# Patient Record
Sex: Male | Born: 1950
Health system: Southern US, Community
[De-identification: ages and names within clinical notes are randomized; demographics above are authoritative.]

## PROBLEM LIST (undated history)

## (undated) DIAGNOSIS — E785 Hyperlipidemia, unspecified: Secondary | ICD-10-CM

## (undated) DIAGNOSIS — C801 Malignant (primary) neoplasm, unspecified: Secondary | ICD-10-CM

## (undated) DIAGNOSIS — G629 Polyneuropathy, unspecified: Secondary | ICD-10-CM

## (undated) DIAGNOSIS — N400 Enlarged prostate without lower urinary tract symptoms: Secondary | ICD-10-CM

## (undated) DIAGNOSIS — I480 Paroxysmal atrial fibrillation: Secondary | ICD-10-CM

## (undated) DIAGNOSIS — K7460 Unspecified cirrhosis of liver: Secondary | ICD-10-CM

## (undated) DIAGNOSIS — N50811 Right testicular pain: Secondary | ICD-10-CM

## (undated) DIAGNOSIS — K219 Gastro-esophageal reflux disease without esophagitis: Secondary | ICD-10-CM

## (undated) DIAGNOSIS — N411 Chronic prostatitis: Secondary | ICD-10-CM

## (undated) DIAGNOSIS — M109 Gout, unspecified: Secondary | ICD-10-CM

## (undated) DIAGNOSIS — E663 Overweight: Secondary | ICD-10-CM

## (undated) DIAGNOSIS — I10 Essential (primary) hypertension: Secondary | ICD-10-CM

## (undated) DIAGNOSIS — I5022 Chronic systolic (congestive) heart failure: Secondary | ICD-10-CM

## (undated) DIAGNOSIS — E119 Type 2 diabetes mellitus without complications: Secondary | ICD-10-CM

## (undated) DIAGNOSIS — R972 Elevated prostate specific antigen [PSA]: Secondary | ICD-10-CM

## (undated) DIAGNOSIS — R350 Frequency of micturition: Secondary | ICD-10-CM

## (undated) DIAGNOSIS — L409 Psoriasis, unspecified: Secondary | ICD-10-CM

## (undated) DIAGNOSIS — M1990 Unspecified osteoarthritis, unspecified site: Secondary | ICD-10-CM

## (undated) DIAGNOSIS — I6930 Unspecified sequelae of cerebral infarction: Secondary | ICD-10-CM

## (undated) HISTORY — DX: Hyperlipidemia, unspecified: E78.5

## (undated) HISTORY — DX: Gastro-esophageal reflux disease without esophagitis: K21.9

## (undated) HISTORY — DX: Gout, unspecified: M10.9

## (undated) HISTORY — DX: Malignant (primary) neoplasm, unspecified: C80.1

## (undated) HISTORY — DX: Unspecified osteoarthritis, unspecified site: M19.90

## (undated) HISTORY — DX: Type 2 diabetes mellitus without complications: E11.9

## (undated) HISTORY — DX: Frequency of micturition: R35.0

## (undated) HISTORY — DX: Overweight: E66.3

## (undated) HISTORY — DX: Essential (primary) hypertension: I10

## (undated) HISTORY — DX: Right testicular pain: N50.811

## (undated) HISTORY — DX: Chronic prostatitis: N41.1

## (undated) HISTORY — DX: Benign prostatic hyperplasia without lower urinary tract symptoms: N40.0

## (undated) HISTORY — DX: Elevated prostate specific antigen (PSA): R97.20

## (undated) HISTORY — PX: OTHER SURGICAL HISTORY: SHX169

## (undated) HISTORY — PX: PILONIDAL CYST EXCISION: SHX744

## (undated) HISTORY — DX: Psoriasis, unspecified: L40.9

---

## 2010-09-11 ENCOUNTER — Ambulatory Visit: Payer: Self-pay | Admitting: Gastroenterology

## 2014-01-03 DIAGNOSIS — IMO0001 Reserved for inherently not codable concepts without codable children: Secondary | ICD-10-CM | POA: Insufficient documentation

## 2014-01-03 DIAGNOSIS — K219 Gastro-esophageal reflux disease without esophagitis: Secondary | ICD-10-CM

## 2014-01-03 DIAGNOSIS — M109 Gout, unspecified: Secondary | ICD-10-CM | POA: Insufficient documentation

## 2014-01-03 DIAGNOSIS — L409 Psoriasis, unspecified: Secondary | ICD-10-CM | POA: Insufficient documentation

## 2014-01-03 DIAGNOSIS — I10 Essential (primary) hypertension: Secondary | ICD-10-CM | POA: Insufficient documentation

## 2014-01-03 DIAGNOSIS — E785 Hyperlipidemia, unspecified: Secondary | ICD-10-CM | POA: Insufficient documentation

## 2014-01-03 DIAGNOSIS — M1990 Unspecified osteoarthritis, unspecified site: Secondary | ICD-10-CM | POA: Insufficient documentation

## 2014-01-03 DIAGNOSIS — E1169 Type 2 diabetes mellitus with other specified complication: Secondary | ICD-10-CM | POA: Insufficient documentation

## 2014-05-18 DIAGNOSIS — E119 Type 2 diabetes mellitus without complications: Secondary | ICD-10-CM | POA: Insufficient documentation

## 2014-06-02 ENCOUNTER — Ambulatory Visit: Payer: Self-pay | Admitting: Physician Assistant

## 2014-06-30 ENCOUNTER — Ambulatory Visit: Payer: Self-pay | Admitting: Physician Assistant

## 2015-05-05 ENCOUNTER — Other Ambulatory Visit: Payer: Self-pay | Admitting: Urology

## 2015-05-05 DIAGNOSIS — R972 Elevated prostate specific antigen [PSA]: Secondary | ICD-10-CM

## 2015-05-15 NOTE — Addendum Note (Signed)
Addended by: Toniann Fail C on: 05/15/2015 11:13 AM   Modules accepted: Orders

## 2015-05-15 NOTE — Telephone Encounter (Signed)
Spoke with pt in reference to finasteride. Pt stated he has quit taking medication due to not knowing if he should continue. Nurse read pt Peter Ryan's last note, which stated he should continue medication. Pt voiced understanding stating he would need more medication. Nurse made pt aware he would need a f/u appt for further refills. Pt voiced understanding and was transferred to the front to make appt. Labs orders have been placed.

## 2015-05-16 ENCOUNTER — Other Ambulatory Visit: Payer: BLUE CROSS/BLUE SHIELD

## 2015-05-16 DIAGNOSIS — R972 Elevated prostate specific antigen [PSA]: Secondary | ICD-10-CM

## 2015-05-17 LAB — PSA: PROSTATE SPECIFIC AG, SERUM: 3.8 ng/mL (ref 0.0–4.0)

## 2015-05-23 ENCOUNTER — Encounter: Payer: Self-pay | Admitting: *Deleted

## 2015-05-23 ENCOUNTER — Other Ambulatory Visit: Payer: Self-pay | Admitting: *Deleted

## 2015-05-30 ENCOUNTER — Encounter: Payer: Self-pay | Admitting: Urology

## 2015-05-30 ENCOUNTER — Ambulatory Visit (INDEPENDENT_AMBULATORY_CARE_PROVIDER_SITE_OTHER): Payer: BLUE CROSS/BLUE SHIELD | Admitting: Urology

## 2015-05-30 VITALS — BP 187/79 | HR 103 | Ht 69.0 in | Wt 297.8 lb

## 2015-05-30 DIAGNOSIS — N401 Enlarged prostate with lower urinary tract symptoms: Secondary | ICD-10-CM

## 2015-05-30 DIAGNOSIS — R972 Elevated prostate specific antigen [PSA]: Secondary | ICD-10-CM

## 2015-05-30 DIAGNOSIS — N138 Other obstructive and reflux uropathy: Secondary | ICD-10-CM

## 2015-05-30 MED ORDER — FINASTERIDE 5 MG PO TABS: 5.0000 mg | ORAL_TABLET | Freq: Every day | ORAL | Status: DC

## 2015-05-30 NOTE — Progress Notes (Signed)
05/30/2015 4:02 PM   Peter Ryan 12/28/1950 [id number]  Referring provider: No referring provider defined for this encounter.  Chief Complaint  Patient presents with  . Elevated PSA    HPI: Patient is a 64 year old white male with a history of elevated PSA and BPH with LUTS who presents today for a 6 month follow-up.  Elevated PSA Patient was found to have an elevated PSA of 7.5 ng/mL in 05/2014.  He did have an infection at that time.  His PSA's have been decreasing.  His most recent PSA is 3.8 ng/mL on 05/16/2015.  BPH with LUTS His IPSS score today is 3, which is mild lower urinary tract symptomatology. He is pleased with his quality life due to his urinary symptoms.  His major complaint today nocturia and frequency.  He has had these symptoms for off and on for many years.  He denies any dysuria, hematuria or suprapubic pain.  He currently taking finasteride 5 mg daily.  His has had prostatitis in the past.  He also denies any recent fevers, chills, nausea or vomiting.  He does not have a family history of PCa.      IPSS      05/30/15 1000       International Prostate Symptom Score   How often have you had the sensation of not emptying your bladder? Not at All     How often have you had to urinate less than every two hours? Less than half the time     How often have you found you stopped and started again several times when you urinated? Not at All     How often have you found it difficult to postpone urination? Not at All     How often have you had a weak urinary stream? Not at All     How often have you had to strain to start urination? Not at All     How many times did you typically get up at night to urinate? 1 Time     Total IPSS Score 3     Quality of Life due to urinary symptoms   If you were to spend the rest of your life with your urinary condition just the way it is now how would you feel about that? Pleased        Score:  1-7 Mild 8-19 Moderate 20-35  Severe     PMH: Past Medical History  Diagnosis Date  . Dyslipidemia   . Gastric reflux   . Psoriasis   . Arthritis   . Gout   . HTN (hypertension)   . Elevated PSA   . Urinary frequency   . Testicular pain, right   . Chronic prostatitis   . Over weight   . BPH (benign prostatic hyperplasia)     Surgical History: Past Surgical History  Procedure Laterality Date  . Hydrocelectomy    . Pilonidal cyst excision      Home Medications:    Medication List       This list is accurate as of: 05/30/15  4:02 PM.  Always use your most recent med list.               allopurinol 300 MG tablet  Commonly known as:  ZYLOPRIM  TAKE 1 TABLET BY MOUTH EVERY DAY     amLODipine 10 MG tablet  Commonly known as:  NORVASC  Take by mouth.     aspirin EC 81 MG tablet  Take  by mouth.     finasteride 5 MG tablet  Commonly known as:  PROSCAR  Take 1 tablet (5 mg total) by mouth daily.     losartan 100 MG tablet  Commonly known as:  COZAAR  Take by mouth.     omeprazole 20 MG capsule  Commonly known as:  PRILOSEC  Take by mouth.     trandolapril 4 MG tablet  Commonly known as:  MAVIK  TAKE 1 TABLET (4 MG TOTAL) BY MOUTH 2 (TWO) TIMES DAILY.     triamcinolone cream 0.1 %  Commonly known as:  KENALOG  APPLY TO AFFECTED AREA ON LEGS EVERY DAY - TWICE A DAY UNTIL CLEAR     Vitamin D3 1000 UNITS Caps  Take by mouth.        Allergies:  Allergies  Allergen Reactions  . Metformin Other (See Comments)    Stomach upset     Family History: Family History  Problem Relation Age of Onset  . Bladder Cancer Neg Hx   . Uterine cancer Mother   . Kidney disease Neg Hx     Social History:  reports that he has quit smoking. He does not have any smokeless tobacco history on file. He reports that he does not drink alcohol or use illicit drugs.  ROS: UROLOGY Frequent Urination?: No Hard to postpone urination?: No Burning/pain with urination?: No Get up at night to  urinate?: No Leakage of urine?: No Urine stream starts and stops?: No Trouble starting stream?: No Do you have to strain to urinate?: No Blood in urine?: No Urinary tract infection?: No Sexually transmitted disease?: No Injury to kidneys or bladder?: No Painful intercourse?: No Weak stream?: No Erection problems?: No Penile pain?: No  Gastrointestinal Nausea?: No Vomiting?: No Indigestion/heartburn?: No Diarrhea?: No Constipation?: No  Constitutional Fever: No Night sweats?: No Weight loss?: No Fatigue?: No  Skin Skin rash/lesions?: No Itching?: No  Eyes Blurred vision?: No Double vision?: No  Ears/Nose/Throat Sore throat?: No Sinus problems?: No  Hematologic/Lymphatic Swollen glands?: No Easy bruising?: No  Cardiovascular Leg swelling?: No Chest pain?: No  Respiratory Cough?: No Shortness of breath?: No  Endocrine Excessive thirst?: No  Musculoskeletal Back pain?: No Joint pain?: No  Neurological Headaches?: No Dizziness?: No  Psychologic Depression?: No Anxiety?: No  Physical Exam: BP 187/79 mmHg  Pulse 103  GU: Patient with uncircumcised phallus. Foreskin easily retracted  Urethral meatus is patent.  No penile discharge. No penile lesions or rashes. Scrotum without lesions, cysts, rashes and/or edema.  Testicles are located scrotally bilaterally. No masses are appreciated in the testicles. Left and right epididymis are normal. Rectal: Patient with  normal sphincter tone. Perineum without scarring or rashes. No rectal masses are appreciated. Prostate is approximately 50 grams, no nodules are appreciated. Seminal vesicles are normal.   Laboratory Data:  Lab Results  Component Value Date   PSA 3.8 05/16/2015   PSA history:  7.5 ng/mL on 06/21/2014             6.4 ng/mL on 08/05/2014             4.1 ng/mL on 09/28/2014  Assessment & Plan:    1. Elevated PSA:   Patient was initially found to have an elevated PSA of 7.5 in September  2015. He did also have a urinary tract infection at this time. He is in place on finasteride due to his history of chronic prostatitis. His PSA continues to decrease. It is currently 3.8 ng/mL  on 05/16/2015.  2. BPH with LUTS:   Patient's IPSS score is 3/1.   His DRE demonstrates enlargement, no nodules.  Patient would like to continue  medical treatment.  I sent a refill for finasteride 5 mg daily in to his pharmacy.   He will follow up in 6 months for a PSA, DRE and an IPSS.  Return in about 1 year (around 05/29/2016) for IPSS.  Zara Council, [facility] [address] [place], [place] [address] [phone]

## 2015-06-09 ENCOUNTER — Other Ambulatory Visit: Payer: Self-pay | Admitting: Family Medicine

## 2015-06-12 ENCOUNTER — Telehealth: Payer: Self-pay | Admitting: Family Medicine

## 2015-06-12 NOTE — Telephone Encounter (Signed)
See below please-aa 

## 2015-06-12 NOTE — Telephone Encounter (Signed)
FYI....  Pt called to tell you that [facility] should be sending you a request for all his prescriptions some Dr. Lovie Macadamia ordered.  He just wanted to let you know that he knows and approves this...  Any questions his call back is [phone]  Thanks teri

## 2015-06-13 ENCOUNTER — Other Ambulatory Visit: Payer: Self-pay

## 2015-06-13 MED ORDER — TRANDOLAPRIL 4 MG PO TABS: 4.0000 mg | ORAL_TABLET | Freq: Every day | ORAL | Status: DC

## 2015-06-13 MED ORDER — ALLOPURINOL 300 MG PO TABS: 300.0000 mg | ORAL_TABLET | Freq: Every day | ORAL | Status: DC

## 2015-06-13 MED ORDER — AMLODIPINE BESYLATE 10 MG PO TABS: 10.0000 mg | ORAL_TABLET | Freq: Every day | ORAL | Status: DC

## 2015-06-27 MED ORDER — TRANDOLAPRIL 4 MG PO TABS: 4.0000 mg | ORAL_TABLET | Freq: Two times a day (BID) | ORAL | Status: DC

## 2015-06-27 NOTE — Telephone Encounter (Signed)
Pt called back regarding one of his prescriptions trandolapril 4mg  should be two pills once a day.  Instead of 41 a day.  quanity should be 60 a month.  Pt Call back number [phone]  Thanks Con Memos

## 2015-06-27 NOTE — Telephone Encounter (Signed)
LOV 11/21/14 and no future appts have been made so far.-aa

## 2015-06-27 NOTE — Telephone Encounter (Signed)
He can have 1 month refills. He will need appointment sometime in October. Thank you.

## 2015-06-27 NOTE — Telephone Encounter (Signed)
When was this patient's last visit and what is his next visit scheduled.?

## 2015-06-27 NOTE — Telephone Encounter (Signed)
RF given and note sent to pharmacist about appt needed-aa

## 2015-08-14 ENCOUNTER — Other Ambulatory Visit: Payer: Self-pay | Admitting: Family Medicine

## 2015-08-15 ENCOUNTER — Ambulatory Visit (INDEPENDENT_AMBULATORY_CARE_PROVIDER_SITE_OTHER): Payer: BLUE CROSS/BLUE SHIELD | Admitting: Family Medicine

## 2015-08-15 ENCOUNTER — Encounter: Payer: Self-pay | Admitting: Family Medicine

## 2015-08-15 VITALS — BP 164/86 | HR 80 | Temp 98.4°F | Resp 16 | Wt 303.0 lb

## 2015-08-15 DIAGNOSIS — I10 Essential (primary) hypertension: Secondary | ICD-10-CM | POA: Diagnosis not present

## 2015-08-15 DIAGNOSIS — E118 Type 2 diabetes mellitus with unspecified complications: Secondary | ICD-10-CM | POA: Diagnosis not present

## 2015-08-15 DIAGNOSIS — Z794 Long term (current) use of insulin: Secondary | ICD-10-CM | POA: Diagnosis not present

## 2015-08-15 LAB — POCT UA - MICROALBUMIN: Microalbumin Ur, POC: 100 mg/L

## 2015-08-15 LAB — POCT GLYCOSYLATED HEMOGLOBIN (HGB A1C): Hemoglobin A1C: 6.8

## 2015-08-15 MED ORDER — AMLODIPINE BESYLATE 10 MG PO TABS: 10.0000 mg | ORAL_TABLET | Freq: Every day | ORAL | Status: DC

## 2015-08-15 MED ORDER — TRANDOLAPRIL 4 MG PO TABS: 4.0000 mg | ORAL_TABLET | Freq: Two times a day (BID) | ORAL | Status: DC

## 2015-08-15 NOTE — Progress Notes (Signed)
Patient ID: Peter Ryan, male   DOB: 01-22-1951, 64 y.o.   MRN: [id number]       Patient: Peter Ryan Male    DOB: [birth year]   64 y.o.   MRN: [id number] Visit Date: 08/15/2015  Today's Provider: Wilhemena Durie, MD   Chief Complaint  Patient presents with  . Hypertension   Subjective:    HPI  Hypertension, follow-up:  BP Readings from Last 3 Encounters:  08/15/15 164/86  05/30/15 187/79    He was last seen for hypertension 3 months ago.  Management since that visit includes adding trandolapril. He reports good compliance with treatment. He is not having side effects.  He is not exercising. He is not adherent to low salt diet.   Outside blood pressures are 130/80. He is experiencing none.  Patient denies chest pain, chest pressure/discomfort, irregular heart beat, lower extremity edema, palpitations and tachypnea.   Cardiovascular risk factors include diabetes mellitus.      Weight trend: fluctuating a bit Wt Readings from Last 3 Encounters:  08/15/15 303 lb (137.44 kg)  05/30/15 297 lb 12.8 oz (135.081 kg)    Current diet: in general, an "unhealthy" diet  ------------------------------------------------------------------------      Allergies  Allergen Reactions  . Metformin Other (See Comments)    Stomach upset    Previous Medications   ALLOPURINOL (ZYLOPRIM) 300 MG TABLET    Take 1 tablet (300 mg total) by mouth daily.   AMLODIPINE (NORVASC) 10 MG TABLET    Take 1 tablet (10 mg total) by mouth daily.   ASPIRIN EC 81 MG TABLET    Take by mouth.   CHOLECALCIFEROL (VITAMIN D3) 1000 UNITS CAPS    Take by mouth.   FINASTERIDE (PROSCAR) 5 MG TABLET    Take 1 tablet (5 mg total) by mouth daily.   LOSARTAN (COZAAR) 100 MG TABLET    Take by mouth.   OMEPRAZOLE (PRILOSEC) 20 MG CAPSULE    Take by mouth.   TRANDOLAPRIL (MAVIK) 4 MG TABLET    Take 1 tablet (4 mg total) by mouth 2 times daily at 12 noon and 4 pm.   TRIAMCINOLONE CREAM (KENALOG) 0.1 %     APPLY TO AFFECTED AREA ON LEGS EVERY DAY - TWICE A DAY UNTIL CLEAR    Review of Systems  Constitutional: Negative.   Respiratory: Negative.   Cardiovascular: Negative.   Endocrine: Negative.   Musculoskeletal: Negative.   Allergic/Immunologic: Negative.   Neurological: Negative.   Hematological: Negative.   Psychiatric/Behavioral: Negative.     Social History  Substance Use Topics  . Smoking status: Former Research scientist (life sciences)  . Smokeless tobacco: Not on file     Comment: quit 40 years ago  . Alcohol Use: No   Objective:   BP 164/86 mmHg  Pulse 80  Temp(Src) 98.4 F (36.9 C)  Resp 16  Wt 303 lb (137.44 kg)  Physical Exam  Constitutional: He is oriented to person, place, and time. He appears well-developed and well-nourished.  HENT:  Head: Normocephalic and atraumatic.  Right Ear: External ear normal.  Left Ear: External ear normal.  Nose: Nose normal.  Eyes: Conjunctivae are normal.  Neck: Neck supple.  Cardiovascular: Normal rate, regular rhythm and normal heart sounds.   Pulmonary/Chest: Effort normal.  Abdominal: Soft.  Neurological: He is alert and oriented to person, place, and time.  Skin: Skin is warm and dry.  Psychiatric: He has a normal mood and affect. His behavior is normal. Judgment and thought content normal.  Assessment & Plan:     1. Type 2 diabetes mellitus with complication, with long-term current use of insulin (HCC)  - POCT UA - Microalbumin - POCT glycosylated hemoglobin (Hb A1C)--6.8 today - Ambulatory referral to diabetic education  2. Benign essential HTN  - amLODipine (NORVASC) 10 MG tablet; Take 1 tablet (10 mg total) by mouth daily.  Dispense: 30 tablet; Refill: 6 - trandolapril (MAVIK) 4 MG tablet; Take 1 tablet (4 mg total) by mouth 2 times daily at 12 noon and 4 pm.  Dispense: 60 tablet; Refill: 6 3. Morbid obesity Diet and exercise discussed again. This is been stressed to the patient in the past and he realizes that he has been  completely noncompliant 4. Noncompliance This is a worrisome issue is patient has only kept appointments when I refused to refill medications without a visit. Otherwise he would not be in the office today. I attempted to discuss the importance of follow-up with patient again today.      Peter Ryan Mon, MD  [facility]

## 2015-08-21 ENCOUNTER — Other Ambulatory Visit: Payer: Self-pay | Admitting: Family Medicine

## 2015-08-22 ENCOUNTER — Encounter: Payer: BLUE CROSS/BLUE SHIELD | Attending: Family Medicine | Admitting: *Deleted

## 2015-08-22 ENCOUNTER — Encounter: Payer: Self-pay | Admitting: *Deleted

## 2015-08-22 VITALS — BP 160/90 | Ht 70.0 in | Wt 298.5 lb

## 2015-08-22 DIAGNOSIS — E119 Type 2 diabetes mellitus without complications: Secondary | ICD-10-CM | POA: Diagnosis not present

## 2015-08-22 NOTE — Patient Instructions (Addendum)
Check blood sugars 2 x day before breakfast and 2 hrs after supper 3-4 x week Exercise: Begin walking  for  15  minutes   3 days a week and gradually increase to 150 minutes/week Avoid sugar sweetened drinks (tea) Eat 3 meals day,   2 snacks a day Space meals 4-6 hours apart Bring blood sugar records to the next class

## 2015-08-22 NOTE — Progress Notes (Signed)
Diabetes Self-Management Education  Visit Type: First/Initial  Appt. Start Time: 1110 Appt. End Time: 1225  08/22/2015  Peter Ryan, identified by name and date of birth, is a 64 y.o. male with a diagnosis of Diabetes: Type 2.   ASSESSMENT  Blood pressure 160/90, height 5\' 10"  (1.778 m), weight 298 lb 8 oz (135.399 kg). Body mass index is 42.83 kg/(m^2).      Diabetes Self-Management Education - 08/22/15 1249    Visit Information   Visit Type First/Initial   Initial Visit   Diabetes Type Type 2   Are you currently following a meal plan? No   Are you taking your medications as prescribed? Yes   Date Diagnosed "recently" - per labs from 2015 his A1C was 7.0 %   Health Coping   How would you rate your overall health? Good   Psychosocial Assessment   Patient Belief/Attitude about Diabetes Motivated to manage diabetes  "not happy with knowing it"   Self-care barriers None   Self-management support Family;Doctor's office   Other persons present Spouse/SO   Patient Concerns Nutrition/Meal planning;Monitoring;Healthy Lifestyle;Glycemic Control;Weight Control   Special Needs None   Preferred Learning Style Auditory;Visual   Learning Readiness Change in progress   How often do you need to have someone help you when you read instructions, pamphlets, or other written materials from your doctor or pharmacy? 1 - Never   What is the last grade level you completed in school? [id number]   Complications   Last HgB A1C per patient/outside source 6.8 %  08/15/15   How often do you check your blood sugar? 0 times/day (not testing)  Has a meter but is not testing. He will call MD office for prescription for new strips and lancets.    Have you had a dilated eye exam in the past 12 months? Yes  appt scheduled for today   Have you had a dental exam in the past 12 months? Yes   Are you checking your feet? No   Dietary Intake   Breakfast cereal and milk; eggs, bacon, chicken sausage, grits,  oatmeal   Snack (morning) apple or yogurt or nabs   Lunch frozen dinner   Snack (afternoon) apple or yogurt or nabs   Dinner chicken or fish, vegetables, occasional pizza   Beverage(s) water, diet soda, sugar sweetened tea   Exercise   Exercise Type ADL's   Patient Education   Previous Diabetes Education No   Disease state  Definition of diabetes, type 1 and 2, and the diagnosis of diabetes   Nutrition management  Role of diet in the treatment of diabetes and the relationship between the three main macronutrients and blood glucose level   Physical activity and exercise  Role of exercise on diabetes management, blood pressure control and cardiac health.   Monitoring Purpose and frequency of SMBG.;Identified appropriate SMBG and/or A1C goals.   Chronic complications Relationship between chronic complications and blood glucose control;Dental care;Retinopathy and reason for yearly dilated eye exams   Psychosocial adjustment Identified and addressed patients feelings and concerns about diabetes   Individualized Goals (developed by patient)   Reducing Risk Improve blood sugars Decrease medications Prevent diabetes complications Lose weight Lead a healthier lifestyle Become more fit   Outcomes   Expected Outcomes Demonstrated interest in learning. Expect positive outcomes      Individualized Plan for Diabetes Self-Management Training:   Learning Objective:  Patient will have a greater understanding of diabetes self-management. Patient education plan is to attend  individual and/or group sessions per assessed needs and concerns.   Plan:   Patient Instructions  Check blood sugars 2 x day before breakfast and 2 hrs after supper 3-4 x week Exercise: Begin walking  for  15  minutes   3 days a week and gradually increase to 150 minutes/week Avoid sugar sweetened drinks (tea) Eat 3 meals day,   2 snacks a day Space meals 4-6 hours apart Bring blood sugar records to the next  class   Expected Outcomes:  Demonstrated interest in learning. Expect positive outcomes  Education material provided:  General Meal Planning Guidelines  If problems or questions, patient to contact team via:   Johny Drilling, [place], [place], CDE [phone]  Future DSME appointment:  Thursday September 28, 2015 for Class 1

## 2015-09-28 ENCOUNTER — Encounter: Payer: BLUE CROSS/BLUE SHIELD | Attending: Family Medicine | Admitting: Dietician

## 2015-09-28 ENCOUNTER — Encounter: Payer: Self-pay | Admitting: Dietician

## 2015-09-28 VITALS — Wt 298.2 lb

## 2015-09-28 DIAGNOSIS — E119 Type 2 diabetes mellitus without complications: Secondary | ICD-10-CM | POA: Diagnosis present

## 2015-09-28 NOTE — Progress Notes (Signed)
Appt. Start Time: 9:00am Appt. End Time: 12:00 pm  Class 1 Diabetes Overview - define DM; state own type of DM; identify functions of pancreas and insulin; define insulin deficiency vs insulin resistance  Psychosocial - identify DM as a source of stress; state the effects of stress on BG control; verbalize appropriate stress management techniques; identify personal stress issues   Nutritional Management - describe effects of food on blood glucose; identify sources of carbohydrate, protein and fat; verbalize the importance of balance meals in controlling blood glucose; identify meals as well balanced or not; estimate servings of carbohydrate from menus; use food labels to identify servings size, content of carbohydrate, fiber, protein, fat, saturated fat and sodium; recognize food sources of fat, saturated fat, trans fat, sodium and verbalize goals for intake; describe healthful appropriate food choices when dining out   Exercise - describe the effects of exercise on blood glucose and importance of regular exercise in controlling diabetes; state a plan for personal exercise; verbalize contraindications for exercise  Self-Monitoring - state importance of HBGM and demo procedure accurately; use HBGM results to effectively manage diabetes; identify importance of regular HbA1C testing and goals for results  Acute Complications/Sick Day Guidelines - recognize hyperglycemia and hypoglycemia with causes and effects; identify blood glucose results as high, low or in control; list steps in treating and preventing high and low blood glucose; state appropriate measure to manage blood glucose when ill (need for meds, HBGM plan, when to call physician, need for fluids)  Chronic Complications/Foot, Skin, Eye Dental Care - identify possible long-term complications of diabetes (retinopathy, neuropathy, nephropathy, cardiovascular disease, infections); explain steps in prevention and treatment of chronic complications;  state importance of daily self-foot exams; describe how to examine feet and what to look for; explain appropriate eye and dental care  Lifestyle Changes/Goals & Health/Community Resources - state benefits of making appropriate lifestyle changes; identify habits that need to change (meals, tobacco, alcohol); identify strategies to reduce risk factors for personal health; set goals for proper diabetes care; state need for and frequency of healthcare follow-up; describe appropriate community resources for good health (ADA, web sites, apps)   Pregnancy/Sexual Health - define gestational diabetes; state importance of good blood glucose control and birth control prior to pregnancy; state importance of good blood glucose control in preventing sexual problems (impotence, vaginal dryness, infections, loss of desire); state relationship of blood glucose control and pregnancy outcome; describe risk of maternal and fetal complications  Teaching Materials Used: Class 1 Slides/Notebook Diabetes Booklet ID Card  Medic Alert/Medic ID Forms Sleep Evaluation Exercise Handout Daily Food Record Planning a Balanced Meal Goals for Class 1 

## 2015-10-05 ENCOUNTER — Encounter: Payer: Self-pay | Admitting: Family Medicine

## 2015-10-05 ENCOUNTER — Encounter: Payer: Self-pay | Admitting: *Deleted

## 2015-10-05 ENCOUNTER — Encounter: Payer: BLUE CROSS/BLUE SHIELD | Attending: Family Medicine | Admitting: *Deleted

## 2015-10-05 VITALS — Wt 297.6 lb

## 2015-10-05 DIAGNOSIS — E119 Type 2 diabetes mellitus without complications: Secondary | ICD-10-CM | POA: Insufficient documentation

## 2015-10-05 NOTE — Progress Notes (Signed)

## 2015-10-12 ENCOUNTER — Encounter: Payer: BLUE CROSS/BLUE SHIELD | Admitting: Dietician

## 2015-10-12 ENCOUNTER — Encounter: Payer: Self-pay | Admitting: Dietician

## 2015-10-12 VITALS — BP 138/86 | Ht 69.0 in | Wt 300.2 lb

## 2015-10-12 DIAGNOSIS — E119 Type 2 diabetes mellitus without complications: Secondary | ICD-10-CM

## 2015-10-12 NOTE — Progress Notes (Signed)
Appt. Start Time: 9:00am  Appt. End Time: 12:00  Class 3 Psychosocial - identify DM as a source of stress; state the effects of stress on BG control; verbalize appropriate stress management techniques; identify personal stress issues   Nutritional Management - describe effects of food on blood glucose; identify sources of carbohydrate, protein and fat; verbalize the importance of balance meals in controlling blood glucose; identify meals as well balanced or not; estimate servings of carbohydrate from menus; use food labels to identify servings size, content of carbohydrate, fiber, protein, fat, saturated fat and sodium; recognize food sources of fat, saturated fat, trans fat, sodium and verbalize goals for intake; describe healthful appropriate food choices when dining out   Exercise - describe the effects of exercise on blood glucose and importance of regular exercise in controlling diabetes; state a plan for personal exercise; verbalize contraindications for exercise  Self-Monitoring - state importance of HBGM and demo procedure accurately; use HBGM results to effectively manage diabetes; identify importance of regular HbA1C testing and goals for results  Acute Complications/Sick Day Guidelines - recognize hyperglycemia and hypoglycemia with causes and effects; identify blood glucose results as high, low or in control; list steps in treating and preventing high and low blood glucose; state appropriate measure to manage blood glucose when ill (need for meds, HBGM plan, when to call physician, need for fluids)  Chronic Complications/Foot, Skin, Eye Dental Care - identify possible long-term complications of diabetes (retinopathy, neuropathy, nephropathy, cardiovascular disease, infections); explain steps in prevention and treatment of chronic complications; state importance of daily self-foot exams; describe how to examine feet and what to look for; explain appropriate eye and dental care  Lifestyle  Changes/Goals & Health/Community Resources - state benefits of making appropriate lifestyle changes; identify habits that need to change (meals, tobacco, alcohol); identify strategies to reduce risk factors for personal health; set goals for proper diabetes care; state need for and frequency of healthcare follow-up; describe appropriate community resources for good health (ADA, web sites, apps)   Teaching Materials Used: Class 3 Slide Packet Diabetes Stress Test Stress Management Tools Stress Poem Recipe/Menu Booklet Nutrition Prescription Fast Food Information Goal Setting Worksheet    

## 2015-10-13 ENCOUNTER — Encounter: Payer: Self-pay | Admitting: *Deleted

## 2015-10-16 ENCOUNTER — Encounter: Payer: BLUE CROSS/BLUE SHIELD | Admitting: Family Medicine

## 2016-02-12 ENCOUNTER — Other Ambulatory Visit: Payer: Self-pay | Admitting: Family Medicine

## 2016-04-16 ENCOUNTER — Other Ambulatory Visit: Payer: Self-pay | Admitting: Family Medicine

## 2016-04-25 ENCOUNTER — Other Ambulatory Visit: Payer: Self-pay | Admitting: Family Medicine

## 2016-04-25 DIAGNOSIS — I10 Essential (primary) hypertension: Secondary | ICD-10-CM

## 2016-05-14 ENCOUNTER — Other Ambulatory Visit: Payer: Self-pay | Admitting: Family Medicine

## 2016-05-14 NOTE — Telephone Encounter (Signed)
Must be seen for further refills

## 2016-05-24 ENCOUNTER — Other Ambulatory Visit: Payer: BLUE CROSS/BLUE SHIELD

## 2016-05-27 ENCOUNTER — Other Ambulatory Visit: Payer: Self-pay | Admitting: Family Medicine

## 2016-05-27 DIAGNOSIS — I10 Essential (primary) hypertension: Secondary | ICD-10-CM

## 2016-05-28 ENCOUNTER — Other Ambulatory Visit: Payer: Self-pay | Admitting: Family Medicine

## 2016-05-28 DIAGNOSIS — I10 Essential (primary) hypertension: Secondary | ICD-10-CM

## 2016-05-28 MED ORDER — AMLODIPINE BESYLATE 10 MG PO TABS: 10.0000 mg | ORAL_TABLET | Freq: Every day | ORAL | 1 refills | Status: DC

## 2016-05-28 NOTE — Telephone Encounter (Signed)
Pt states per the pharmacy he will need an appointment to refill the Rx for amLODipine (NORVASC) 10 MG tablet.  Pt has set an appointment for 06/05/2016 with Dr Rosanna Randy.  Pt states he is completely out of this and is requesting a 30 day supply sent in.  [facility]  [id number]

## 2016-05-28 NOTE — Telephone Encounter (Signed)
RX sent in-aa 

## 2016-05-29 ENCOUNTER — Ambulatory Visit: Payer: BLUE CROSS/BLUE SHIELD | Admitting: Urology

## 2016-06-04 ENCOUNTER — Other Ambulatory Visit: Payer: Self-pay | Admitting: Urology

## 2016-06-05 ENCOUNTER — Ambulatory Visit: Payer: BLUE CROSS/BLUE SHIELD | Admitting: Family Medicine

## 2016-06-12 ENCOUNTER — Telehealth: Payer: Self-pay | Admitting: Urology

## 2016-06-12 NOTE — Telephone Encounter (Signed)
Pt is out of town working, experiencing symptoms of prostitis and would like to know if he could have some abx called in for him before "this gets worse" Please advise.

## 2016-06-12 NOTE — Telephone Encounter (Signed)
Spoke with patient and let him know that [facility] would not give him an antibiotic. No follow up on file and patient has not been seen in office since Aug 2016 and should have had a follow up in 6 months and did not. It is not proper care. Patient to make appointment to be seen.

## 2016-06-13 ENCOUNTER — Other Ambulatory Visit: Payer: Self-pay | Admitting: Family Medicine

## 2016-06-14 ENCOUNTER — Encounter: Payer: Self-pay | Admitting: Family Medicine

## 2016-06-14 ENCOUNTER — Ambulatory Visit (INDEPENDENT_AMBULATORY_CARE_PROVIDER_SITE_OTHER): Payer: BLUE CROSS/BLUE SHIELD | Admitting: Family Medicine

## 2016-06-14 VITALS — BP 162/78 | HR 98 | Temp 98.2°F | Resp 18 | Wt 307.0 lb

## 2016-06-14 DIAGNOSIS — N410 Acute prostatitis: Secondary | ICD-10-CM

## 2016-06-14 LAB — POCT URINALYSIS DIPSTICK
Bilirubin, UA: NEGATIVE
Blood, UA: NEGATIVE
GLUCOSE UA: NEGATIVE
Ketones, UA: NEGATIVE
LEUKOCYTES UA: NEGATIVE
Nitrite, UA: NEGATIVE
PROTEIN UA: NEGATIVE
SPEC GRAV UA: 1.02
UROBILINOGEN UA: 0.2
pH, UA: 6

## 2016-06-14 MED ORDER — SULFAMETHOXAZOLE-TRIMETHOPRIM 800-160 MG PO TABS: 1.0000 | ORAL_TABLET | Freq: Two times a day (BID) | ORAL | 1 refills | Status: DC

## 2016-06-14 NOTE — Progress Notes (Signed)
Subjective:     Patient ID: Peter Ryan, male   DOB: [birth year], 65 y.o.   MRN: [id number]  HPI  Chief Complaint  Patient presents with  . Urinary Frequency    pt has history of prostatitis and says its feels very similar  Reports malaise, genital area discomfort, and urinary frequency. Hx of BPH. Has been followed by [facility].   Review of Systems     Objective:   Physical Exam  Constitutional: He appears well-developed and well-nourished. No distress.  Genitourinary:  Genitourinary Comments: Prostate exam not attempted due to patient's body habitus       Assessment:    1. Prostatitis, acute - POCT urinalysis dipstick - Urine culture - sulfamethoxazole-trimethoprim (BACTRIM DS,SEPTRA DS) 800-160 MG tablet; Take 1 tablet by mouth 2 (two) times daily.  Dispense: 28 tablet; Refill: 1    Plan:    Further f/u pending urine culture.

## 2016-06-14 NOTE — Patient Instructions (Signed)
We will call you with the culture results 

## 2016-06-16 LAB — PLEASE NOTE

## 2016-06-16 LAB — URINE CULTURE: ORGANISM ID, BACTERIA: NO GROWTH

## 2016-06-17 ENCOUNTER — Ambulatory Visit: Payer: BLUE CROSS/BLUE SHIELD | Admitting: Urology

## 2016-06-17 ENCOUNTER — Telehealth: Payer: Self-pay

## 2016-06-17 NOTE — Telephone Encounter (Signed)
Patient was advised, he states that symptoms improved on antibiotic. Peter Ryan

## 2016-06-17 NOTE — Telephone Encounter (Signed)
-----   Message from Carmon Ginsberg, [place] sent at 06/17/2016  7:37 AM EDT ----- No organism detected. Are you improving with the antibiotic?

## 2016-06-26 ENCOUNTER — Encounter: Payer: Self-pay | Admitting: Family Medicine

## 2016-06-26 ENCOUNTER — Ambulatory Visit (INDEPENDENT_AMBULATORY_CARE_PROVIDER_SITE_OTHER): Payer: BLUE CROSS/BLUE SHIELD | Admitting: Family Medicine

## 2016-06-26 VITALS — BP 144/68 | HR 72 | Temp 98.1°F | Resp 14 | Wt 302.0 lb

## 2016-06-26 DIAGNOSIS — E669 Obesity, unspecified: Secondary | ICD-10-CM | POA: Diagnosis not present

## 2016-06-26 DIAGNOSIS — Z794 Long term (current) use of insulin: Secondary | ICD-10-CM

## 2016-06-26 DIAGNOSIS — I10 Essential (primary) hypertension: Secondary | ICD-10-CM | POA: Diagnosis not present

## 2016-06-26 DIAGNOSIS — E118 Type 2 diabetes mellitus with unspecified complications: Secondary | ICD-10-CM

## 2016-06-26 DIAGNOSIS — M2550 Pain in unspecified joint: Secondary | ICD-10-CM

## 2016-06-26 DIAGNOSIS — M109 Gout, unspecified: Secondary | ICD-10-CM | POA: Diagnosis not present

## 2016-06-26 DIAGNOSIS — E785 Hyperlipidemia, unspecified: Secondary | ICD-10-CM | POA: Diagnosis not present

## 2016-06-26 LAB — POCT GLYCOSYLATED HEMOGLOBIN (HGB A1C): Hemoglobin A1C: 6.5

## 2016-06-26 MED ORDER — TRANDOLAPRIL 4 MG PO TABS: 4.0000 mg | ORAL_TABLET | Freq: Two times a day (BID) | ORAL | 5 refills | Status: DC

## 2016-06-26 MED ORDER — AMLODIPINE BESYLATE 10 MG PO TABS
10.0000 mg | ORAL_TABLET | Freq: Every day | ORAL | 5 refills | Status: DC
Start: 2016-06-26 — End: 2016-12-23

## 2016-06-26 MED ORDER — ALLOPURINOL 300 MG PO TABS: 300.0000 mg | ORAL_TABLET | Freq: Every day | ORAL | 5 refills | Status: DC

## 2016-06-26 NOTE — Progress Notes (Signed)
Peter Ryan  MRN: [id number] DOB: 03-Apr-1951  Subjective:  HPI  Patient is here for follow up. Last routine follow up was 08/15/15. Saw [name] for prostatitis on 06/14/16.  Hypertension: patient checks his b/p sometimes and last night it was 159/78. Denies having any cardiac symptoms. BP Readings from Last 3 Encounters:  06/26/16 (!) 144/68  06/14/16 (!) 162/78  10/12/15 138/86   Diabetes: patient checks sugar sometimes and fastings are around 100 to 130, sometimes under 100. No hypoglycemic episodes or symptoms. Last eye exam was June 2017. No numbness or tingling in feet or hands but does get tingling sensation for a second in his legs after walking a lot. He gets no exercise. Lab Results  Component Value Date   HGBA1C 6.8 08/15/2015   Joint pain: patient states he has discussed with you in the past his joint ache that he has had for years. This is still present, also noticed his stability on his feet is not as good as it use to be. He has not had any work up for this issue before. Patient Active Problem List   Diagnosis Date Noted  . Elevated PSA 05/30/2015  . BPH with obstruction/lower urinary tract symptoms 05/30/2015  . Type 2 diabetes mellitus ([place]) 05/18/2014  . Arthritis 01/03/2014  . Dyslipidemia 01/03/2014  . Benign essential HTN 01/03/2014  . Gout 01/03/2014  . Psoriasis 01/03/2014  . Reflux 01/03/2014    Past Medical History:  Diagnosis Date  . Arthritis   . BPH (benign prostatic hyperplasia)   . Chronic prostatitis   . Diabetes mellitus without complication (Peter Ryan)   . Dyslipidemia   . Elevated PSA   . Gastric reflux   . Gout   . HTN (hypertension)   . Over weight   . Psoriasis   . Testicular pain, right   . Urinary frequency     Social History   Social History  . Marital status: Married    Spouse name: N/A  . Number of children: N/A  . Years of education: N/A   Occupational History  . Not on file.   Social History Main Topics  .  Smoking status: Former Smoker    Packs/day: 1.50    Years: 8.00    Types: Cigarettes  . Smokeless tobacco: Never Used     Comment: quit 40 years ago  . Alcohol use No     Comment: rarely  . Drug use: No  . Sexual activity: Not on file   Other Topics Concern  . Not on file   Social History Narrative  . No narrative on file    Outpatient Encounter Prescriptions as of 06/26/2016  Medication Sig Note  . allopurinol (ZYLOPRIM) 300 MG tablet TAKE ONE TABLET BY MOUTH EVERY DAY   . amLODipine (NORVASC) 10 MG tablet Take 1 tablet (10 mg total) by mouth daily.   Marland Kitchen aspirin EC 81 MG tablet Take 81 mg by mouth daily.  05/23/2015: Received from: Peter Ryan  . Cholecalciferol (VITAMIN D3) 1000 UNITS CAPS Take 1,000 Units by mouth daily.  05/23/2015: Received from: Peter Ryan  . trandolapril (MAVIK) 4 MG tablet TAKE 1 TABLET BY MOUTH EVERY DAY AT NOON& 1 TABLET BY MOUTH EVERY DAYAT FOUR. **LAST REFILL MD WILL PRESCRIBE WITHOUT APPOINTMENT**   . [DISCONTINUED] finasteride (PROSCAR) 5 MG tablet Take 1 tablet (5 mg total) by mouth daily. (Patient not taking: Reported on 06/14/2016)   . [DISCONTINUED] losartan (COZAAR) 100 MG tablet Take  by mouth. 05/23/2015: Received from: Peter Ryan  . [DISCONTINUED] omeprazole (PRILOSEC) 20 MG capsule Take by mouth. Reported on 09/28/2015 05/23/2015: Received from: Peter Ryan  . [DISCONTINUED] sulfamethoxazole-trimethoprim (BACTRIM DS,SEPTRA DS) 800-160 MG tablet Take 1 tablet by mouth 2 (two) times daily.   . [DISCONTINUED] triamcinolone cream (KENALOG) 0.1 % Reported on 09/28/2015 05/23/2015: Received from: External Pharmacy   No facility-administered encounter medications on file as of 06/26/2016.     Allergies  Allergen Reactions  . Metformin Other (See Comments)    Stomach upset     Review of Systems  Constitutional: Negative.   Eyes: Negative.   Respiratory: Negative.     Cardiovascular: Negative.   Gastrointestinal: Negative.   Musculoskeletal: Positive for joint pain and myalgias.  Skin: Negative.   Neurological: Positive for tingling.  Endo/Heme/Allergies: Negative.   Psychiatric/Behavioral: Negative.    Objective:  BP (!) 144/68   Pulse 72   Temp 98.1 F (36.7 C)   Resp 14   Wt (!) 302 lb (137 kg)   BMI 44.60 kg/m   Physical Exam  Constitutional: He is oriented to person, place, and time and well-developed, well-nourished, and in no distress.  HENT:  Head: Normocephalic and atraumatic.  Right Ear: External ear normal.  Left Ear: External ear normal.  Nose: Nose normal.  Eyes: Conjunctivae are normal. Pupils are equal, round, and reactive to light.  Neck: Normal range of motion. Neck supple.  Cardiovascular: Normal rate, regular rhythm, normal heart sounds and intact distal pulses.   No murmur heard. Pulmonary/Chest: Effort normal and breath sounds normal. No respiratory distress. He has no wheezes.  Musculoskeletal: He exhibits no edema or tenderness.  Neurological: He is alert and oriented to person, place, and time. No cranial nerve deficit. Gait normal. Coordination normal.  Skin: Skin is warm and dry.  Psychiatric: Mood, memory, affect and judgment normal.   Diabetic Foot Exam - Simple   Simple Foot Form Diabetic Foot exam was performed with the following findings:  Yes 06/26/2016  4:26 PM  Visual Inspection No deformities, no ulcerations, no other skin breakdown bilaterally:  Yes Sensation Testing See comments:  Yes Pulse Check Posterior Tibialis and Dorsalis pulse intact bilaterally:  Yes Comments Decreased sensation in the distal toes on the right and left side     Assessment and Plan :  1. Type 2 diabetes mellitus with complication, with long-term current use of insulin (HCC) A1C 6.5 better. Foot exam today ir slightly worse. Discussed the cause for this and working on habits.  May need to add medication on the next  visit. I stressed the need for weight loss. 2. Benign essential HTN Elevated today. Will follow for now and may need to make adjustments to medication on the next visit. Advised patient that I will not refill medications anymore if patient does not keep his appointments.  3. Gout, unspecified cause, unspecified chronicity, unspecified site  4. Dyslipidemia Due for labs.  5. Adiposity Needs to work on habits and have discussed this with patient.  6. Arthralgia I think this will approve if patient can loose weight. Can refer to orthopedic for further treatment, discussed risks with taking NSAIDs. Follow for now. I do not think this is a vascular issue at this time. 7. Noncompliance with follow-up Patient advised that he will get 6 months of prescriptions but will not refill without being seen. He has been coming up lately noncompliant with follow-up and this is been a consistent pattern with  him. HPI, Exam and A&P transcribed under direction and in the presence of Miguel Aschoff, MD. I have done the exam and reviewed the chart and it is accurate to the best of my knowledge. Miguel Aschoff M.D. [facility]

## 2016-07-01 ENCOUNTER — Other Ambulatory Visit: Payer: Self-pay | Admitting: Family Medicine

## 2016-07-01 DIAGNOSIS — N410 Acute prostatitis: Secondary | ICD-10-CM

## 2016-07-01 MED ORDER — SULFAMETHOXAZOLE-TRIMETHOPRIM 800-160 MG PO TABS: 1.0000 | ORAL_TABLET | Freq: Two times a day (BID) | ORAL | 0 refills | Status: DC

## 2016-07-08 ENCOUNTER — Telehealth: Payer: Self-pay | Admitting: Family Medicine

## 2016-07-08 NOTE — Telephone Encounter (Signed)
Would refer to urology

## 2016-07-08 NOTE — Telephone Encounter (Signed)
Pt stated he saw Mikki Santee for an OV on 06/14/16 for Prostatitis and has been taking sulfamethoxazole-trimethoprim (BACTRIM DS,SEPTRA DS) 800-160 MG tablet  As directed. Pt stated that he is currently on his second round of the medication and I advised that Mikki Santee had sent another Rx for the same medication on 07/01/16. Pt stated that he was not aware of the Rx and wanted to see what he should do since symptoms have improved some but not gone. [facility]. Please advise. Thanks TNP

## 2016-07-08 NOTE — Telephone Encounter (Signed)
Spoke with patient. He feels better, he gets discomfort in the penis area not with urination but just in general , not every day. This has been going for about 3 to 4 weeks. No blood in urine, no fevers. Goes to the bathroom a few times during the day but not so much at night. Drinks water. Does he need to try a different medication? He had urine culture and UA stick done with Mikki Santee last month and it was normal-aa

## 2016-07-08 NOTE — Telephone Encounter (Signed)
Spoke with patient and advised as below. He states he use to go to [facility] urologist but it has been over a year. He will think about this and will call them and if he needs a referral he will let us know-aa

## 2016-07-18 ENCOUNTER — Ambulatory Visit (INDEPENDENT_AMBULATORY_CARE_PROVIDER_SITE_OTHER): Payer: BLUE CROSS/BLUE SHIELD | Admitting: Urology

## 2016-07-18 ENCOUNTER — Encounter: Payer: Self-pay | Admitting: Urology

## 2016-07-18 VITALS — BP 178/83 | HR 89 | Ht 69.0 in | Wt 299.9 lb

## 2016-07-18 DIAGNOSIS — N138 Other obstructive and reflux uropathy: Secondary | ICD-10-CM | POA: Diagnosis not present

## 2016-07-18 DIAGNOSIS — N401 Enlarged prostate with lower urinary tract symptoms: Secondary | ICD-10-CM

## 2016-07-18 DIAGNOSIS — N400 Enlarged prostate without lower urinary tract symptoms: Secondary | ICD-10-CM | POA: Diagnosis not present

## 2016-07-18 DIAGNOSIS — Z87898 Personal history of other specified conditions: Secondary | ICD-10-CM | POA: Diagnosis not present

## 2016-07-18 DIAGNOSIS — N411 Chronic prostatitis: Secondary | ICD-10-CM | POA: Diagnosis not present

## 2016-07-18 LAB — URINALYSIS, COMPLETE
Bilirubin, UA: NEGATIVE
Glucose, UA: NEGATIVE
KETONES UA: NEGATIVE
Leukocytes, UA: NEGATIVE
NITRITE UA: NEGATIVE
PH UA: 6.5 (ref 5.0–7.5)
Specific Gravity, UA: 1.015 (ref 1.005–1.030)
Urobilinogen, Ur: 0.2 mg/dL (ref 0.2–1.0)

## 2016-07-18 LAB — MICROSCOPIC EXAMINATION
BACTERIA UA: NONE SEEN
EPITHELIAL CELLS (NON RENAL): NONE SEEN /HPF (ref 0–10)

## 2016-07-18 LAB — BLADDER SCAN AMB NON-IMAGING: Scan Result: 21

## 2016-07-18 MED ORDER — FINASTERIDE 5 MG PO TABS: 5.0000 mg | ORAL_TABLET | Freq: Every day | ORAL | 4 refills | Status: DC

## 2016-07-18 NOTE — Progress Notes (Addendum)
07/18/2016 9:49 AM   Peter Ryan 1950-12-15 [id number]  Referring provider: [name]., MD [id number] [place], [place] [address]  Chief Complaint  Patient presents with  . Benign Prostatic Hypertrophy    1 year follow up  . Elevated PSA    HPI: Patient is a 65 year old Caucasian male with a history of elevated PSA and BPH with LUTS who presents today for a 12 month follow-up.  Prostatitis Patient has been having lower back pain, perineal ache and suprapubic ache for the last month.  He did see his PCP and was placed on Septra DS and has not seen improvement with his symptoms.  His UA was unremarkable.    History of elevated PSA Patient was found to have an elevated PSA of 7.5 ng/mL in 05/2014.  He did have an infection at that time.  His PSA's have been decreasing.  His most recent PSA is 3.8 ng/mL on 05/16/2015.  PSA drawn today.    BPH with LUTS His IPSS score today is 5, which is mild lower urinary tract symptomatology. He is pleased with his quality life due to his urinary symptoms.  His previous IPSS score was 3/1.  His major complaint today is frequency.  He has had these symptoms for one month as he has been without his finasteride.  He denies any dysuria, hematuria or suprapubic pain.  He also denies any recent fevers, chills, nausea or vomiting.  He does not have a family history of PCa.      IPSS    Row Name 07/18/16 0900         International Prostate Symptom Score   How often have you had the sensation of not emptying your bladder? Less than half the time     How often have you had to urinate less than every two hours? Less than 1 in 5 times     How often have you found you stopped and started again several times when you urinated? Not at All     How often have you found it difficult to postpone urination? Less than 1 in 5 times     How often have you had a weak urinary stream? Not at All     How often have you had to strain to start  urination? Not at All     How many times did you typically get up at night to urinate? 1 Time     Total IPSS Score 5       Quality of Life due to urinary symptoms   If you were to spend the rest of your life with your urinary condition just the way it is now how would you feel about that? Pleased        Score:  1-7 Mild 8-19 Moderate 20-35 Severe  PMH: Past Medical History:  Diagnosis Date  . Arthritis   . BPH (benign prostatic hyperplasia)   . Chronic prostatitis   . Diabetes mellitus without complication ([place])   . Dyslipidemia   . Elevated PSA   . Gastric reflux   . Gout   . HTN (hypertension)   . Over weight   . Psoriasis   . Testicular pain, right   . Urinary frequency     Surgical History: Past Surgical History:  Procedure Laterality Date  . hydrocelectomy    . PILONIDAL CYST EXCISION      Home Medications:    Medication List  Accurate as of 07/18/16  9:49 AM. Always use your most recent med list.          allopurinol 300 MG tablet Commonly known as:  ZYLOPRIM Take 1 tablet (300 mg total) by mouth daily.   amLODipine 10 MG tablet Commonly known as:  NORVASC Take 1 tablet (10 mg total) by mouth daily.   aspirin EC 81 MG tablet Take 81 mg by mouth daily.   finasteride 5 MG tablet Commonly known as:  PROSCAR Take 1 tablet (5 mg total) by mouth daily.   sulfamethoxazole-trimethoprim 800-160 MG tablet Commonly known as:  BACTRIM DS,SEPTRA DS Take 1 tablet by mouth 2 (two) times daily.   trandolapril 4 MG tablet Commonly known as:  MAVIK Take 1 tablet (4 mg total) by mouth 2 (two) times daily.   Vitamin D3 1000 units Caps Take 1,000 Units by mouth daily.       Allergies:  Allergies  Allergen Reactions  . Metformin Other (See Comments)    Stomach upset     Family History: Family History  Problem Relation Age of Onset  . Uterine cancer Mother   . Diabetes Mother   . Bladder Cancer Neg Hx   . Kidney disease Neg Hx   .  Prostate cancer Neg Hx     Social History:  reports that he has quit smoking. His smoking use included Cigarettes. He has a 12.00 pack-year smoking history. He has never used smokeless tobacco. He reports that he does not drink alcohol or use drugs.  ROS: UROLOGY Frequent Urination?: Yes Hard to postpone urination?: No Burning/pain with urination?: No Get up at night to urinate?: No Leakage of urine?: No Urine stream starts and stops?: No Trouble starting stream?: No Do you have to strain to urinate?: No Blood in urine?: No Urinary tract infection?: No Sexually transmitted disease?: No Injury to kidneys or bladder?: No Painful intercourse?: No Weak stream?: No Erection problems?: No Penile pain?: No  Gastrointestinal Nausea?: No Vomiting?: No Indigestion/heartburn?: No Diarrhea?: No Constipation?: No  Constitutional Fever: No Night sweats?: No Weight loss?: No Fatigue?: No  Skin Skin rash/lesions?: No Itching?: No  Eyes Blurred vision?: No Double vision?: No  Ears/Nose/Throat Sore throat?: No Sinus problems?: No  Hematologic/Lymphatic Swollen glands?: No Easy bruising?: No  Cardiovascular Leg swelling?: No Chest pain?: No  Respiratory Cough?: No Shortness of breath?: No  Endocrine Excessive thirst?: No  Musculoskeletal Back pain?: No Joint pain?: No  Neurological Headaches?: No Dizziness?: No  Psychologic Depression?: No Anxiety?: No  Physical Exam: BP (!) 178/83   Pulse 89   Ht 5\' 9"  (1.753 m)   Wt 299 lb 14.4 oz (136 kg)   BMI 44.29 kg/m   Constitutional: Well nourished. Alert and oriented, No acute distress. HEENT: [place] AT, moist mucus membranes. Trachea midline, no masses. Cardiovascular: No clubbing, cyanosis, or edema. Respiratory: Normal respiratory effort, no increased work of breathing. GI: Abdomen is soft, non tender, non distended, no abdominal masses. Liver and spleen not palpable.  No hernias appreciated.  Stool  sample for occult testing is not indicated.   GU: No CVA tenderness.  No bladder fullness or masses.  Patient with uncircumcised phallus. Foreskin easily retracted  Urethral meatus is patent.  No penile discharge. No penile lesions or rashes. Scrotum without lesions, cysts, rashes and/or edema.  Testicles are located scrotally bilaterally. No masses are appreciated in the testicles. Left and right epididymis are normal. Rectal: Patient with  normal sphincter tone. Anus and perineum  without scarring or rashes. No rectal masses are appreciated. DRE limited to patient's body habitus Skin: No rashes, bruises or suspicious lesions. Lymph: No cervical or inguinal adenopathy. Neurologic: Grossly intact, no focal deficits, moving all 4 extremities. Psychiatric: Normal mood and affect.  Laboratory Data: PSA history:  7.5 ng/mL on 06/21/2014             6.4 ng/mL on 08/05/2014             4.1 ng/mL on 09/28/2014  3.8 ng/mL on 05/16/2015  Urinalysis Unremarkable.  See EPIC.   Assessment & Plan:    1. 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 History of elevated PSA  - Patient was initially found to have an elevated PSA of 7.5 in September 2015  - Most recent PSA was 3.8 ng/mL on 05/17/2015    - PSA drawn today, it may be elevated due to not taking finasteride for one month  3. BPH with LUTS  - IPSS score is 5/1, it is worsening  - Continue conservative management, avoiding bladder irritants and timed voiding's  - Restart finasteride 5 mg daily   - RTC in 6 months for IPSS, PSA and exam   Return in about 6 months (around 01/16/2017) for IPSS, PSA and exam.  Zara Council, [facility]  [place] [address], [place], [place] [address] [phone]

## 2016-07-19 ENCOUNTER — Telehealth: Payer: Self-pay

## 2016-07-19 DIAGNOSIS — N400 Enlarged prostate without lower urinary tract symptoms: Secondary | ICD-10-CM

## 2016-07-19 LAB — LIPID PANEL WITH LDL/HDL RATIO
Cholesterol, Total: 181 mg/dL (ref 100–199)
HDL: 28 mg/dL — ABNORMAL LOW (ref >39)
LDL Calculated: 86 mg/dL (ref 0–99)
LDl/HDL Ratio: 3.1 ratio units (ref 0.0–3.6)
Triglycerides: 336 mg/dL — ABNORMAL HIGH (ref 0–149)
VLDL Cholesterol Cal: 67 mg/dL — ABNORMAL HIGH (ref 5–40)

## 2016-07-19 LAB — CBC WITH DIFFERENTIAL/PLATELET
Basophils Absolute: 0 10*3/uL (ref 0.0–0.2)
Basos: 0 %
EOS (ABSOLUTE): 0.1 10*3/uL (ref 0.0–0.4)
Eos: 1 %
Hematocrit: 44.6 % (ref 37.5–51.0)
Hemoglobin: 15.6 g/dL (ref 12.6–17.7)
Immature Grans (Abs): 0 10*3/uL (ref 0.0–0.1)
Immature Granulocytes: 0 %
Lymphocytes Absolute: 1.3 10*3/uL (ref 0.7–3.1)
Lymphs: 21 %
MCH: 29.5 pg (ref 26.6–33.0)
MCHC: 35 g/dL (ref 31.5–35.7)
MCV: 85 fL (ref 79–97)
Monocytes Absolute: 0.6 10*3/uL (ref 0.1–0.9)
Monocytes: 9 %
Neutrophils Absolute: 4.3 10*3/uL (ref 1.4–7.0)
Neutrophils: 69 %
Platelets: 246 10*3/uL (ref 150–379)
RBC: 5.28 x10E6/uL (ref 4.14–5.80)
RDW: 14.1 % (ref 12.3–15.4)
WBC: 6.3 10*3/uL (ref 3.4–10.8)

## 2016-07-19 LAB — COMPREHENSIVE METABOLIC PANEL
ALK PHOS: 61 IU/L (ref 39–117)
ALT: 21 IU/L (ref 0–44)
AST: 19 IU/L (ref 0–40)
Albumin/Globulin Ratio: 1.5 (ref 1.2–2.2)
Albumin: 4.4 g/dL (ref 3.6–4.8)
BILIRUBIN TOTAL: 0.4 mg/dL (ref 0.0–1.2)
BUN / CREAT RATIO: 13 (ref 10–24)
BUN: 10 mg/dL (ref 8–27)
CO2: 23 mmol/L (ref 18–29)
CREATININE: 0.8 mg/dL (ref 0.76–1.27)
Calcium: 8.9 mg/dL (ref 8.6–10.2)
Chloride: 98 mmol/L (ref 96–106)
GFR calc non Af Amer: 95 mL/min/{1.73_m2} (ref >59)
GFR, EST AFRICAN AMERICAN: 110 mL/min/{1.73_m2} (ref >59)
GLOBULIN, TOTAL: 3 g/dL (ref 1.5–4.5)
GLUCOSE: 137 mg/dL — AB (ref 65–99)
Potassium: 4.2 mmol/L (ref 3.5–5.2)
SODIUM: 143 mmol/L (ref 134–144)
TOTAL PROTEIN: 7.4 g/dL (ref 6.0–8.5)

## 2016-07-19 LAB — PSA: Prostate Specific Ag, Serum: 5.3 ng/mL — ABNORMAL HIGH (ref 0.0–4.0)

## 2016-07-19 LAB — URIC ACID: Uric Acid: 6.3 mg/dL (ref 3.7–8.6)

## 2016-07-19 LAB — TSH: TSH: 1.36 u[IU]/mL (ref 0.450–4.500)

## 2016-07-19 NOTE — Telephone Encounter (Signed)
Spoke with pt in reference to PSA results. Pt voiced understanding. Lab appt made and orders placed.  

## 2016-07-19 NOTE — Telephone Encounter (Signed)
LMOM

## 2016-07-19 NOTE — Telephone Encounter (Signed)
-----   Message from Nori Riis, PA-C sent at 07/19/2016  8:13 AM EDT ----- Please notify patient that his PSA is up, but we expected this as he has not been taking his finasteride.  I would like to recheck his PSA in 3 months.

## 2016-07-21 LAB — CULTURE, URINE COMPREHENSIVE

## 2016-07-22 ENCOUNTER — Telehealth: Payer: Self-pay

## 2016-07-22 NOTE — Telephone Encounter (Signed)
Advised pt of lab results. Pt verbally acknowledges understanding. Shaquill Iseman Drozdowski, CMA   

## 2016-07-22 NOTE — Telephone Encounter (Signed)
-----   Message from [name]., MD sent at 07/22/2016 10:13 AM EDT ----- Lipids high as his sugar. Diet and exercise as discussed when patient was in office.

## 2016-08-06 ENCOUNTER — Encounter: Payer: Self-pay | Admitting: Family Medicine

## 2016-10-02 ENCOUNTER — Ambulatory Visit: Payer: BLUE CROSS/BLUE SHIELD | Admitting: Family Medicine

## 2016-10-17 ENCOUNTER — Other Ambulatory Visit: Payer: Self-pay

## 2016-10-21 ENCOUNTER — Ambulatory Visit (INDEPENDENT_AMBULATORY_CARE_PROVIDER_SITE_OTHER): Payer: BLUE CROSS/BLUE SHIELD | Admitting: Physician Assistant

## 2016-10-21 ENCOUNTER — Encounter: Payer: Self-pay | Admitting: Physician Assistant

## 2016-10-21 VITALS — BP 158/76 | HR 84 | Temp 98.4°F | Resp 16 | Wt 319.0 lb

## 2016-10-21 DIAGNOSIS — R05 Cough: Secondary | ICD-10-CM | POA: Diagnosis not present

## 2016-10-21 DIAGNOSIS — R059 Cough, unspecified: Secondary | ICD-10-CM

## 2016-10-21 NOTE — Progress Notes (Signed)
Patient: Peter Ryan Male    DOB: Sep 22, 1951   66 y.o.   MRN: [id number] Visit Date: 10/21/2016  Today's Provider: Trinna Post, PA-C   Chief Complaint  Patient presents with  . Cough    Started about a month ago.    Subjective:    Cough  This is a new problem. The current episode started more than 1 month ago. The problem has been unchanged. The cough is non-productive. Associated symptoms include shortness of breath and wheezing. Pertinent negatives include no chills, ear pain, fever, headaches, heartburn, hemoptysis, postnasal drip, rhinorrhea or sore throat. The symptoms are aggravated by lying down. He has tried OTC cough suppressant for the symptoms. The treatment provided no relief.   Patient is 66 y/o man with hx of HTN, DMII who had cold around 09/23/2016. He reports he has a dry occassional cough that he cannot shake since then. He denies fever, chills, N/V, productive cough. Reports some wheezing. Denies SOB. Worse with lying down. Denies PND and reflux.     Allergies  Allergen Reactions  . Metformin Other (See Comments)    Stomach upset      Current Outpatient Prescriptions:  .  allopurinol (ZYLOPRIM) 300 MG tablet, Take 1 tablet (300 mg total) by mouth daily., Disp: 30 tablet, Rfl: 5 .  amLODipine (NORVASC) 10 MG tablet, Take 1 tablet (10 mg total) by mouth daily., Disp: 30 tablet, Rfl: 5 .  aspirin EC 81 MG tablet, Take 81 mg by mouth daily. , Disp: , Rfl:  .  Cholecalciferol (VITAMIN D3) 1000 UNITS CAPS, Take 1,000 Units by mouth daily. , Disp: , Rfl:  .  finasteride (PROSCAR) 5 MG tablet, Take 1 tablet (5 mg total) by mouth daily., Disp: 90 tablet, Rfl: 4 .  trandolapril (MAVIK) 4 MG tablet, Take 1 tablet (4 mg total) by mouth 2 (two) times daily., Disp: 60 tablet, Rfl: 5  Review of Systems  Constitutional: Positive for fatigue. Negative for activity change, appetite change, chills, diaphoresis, fever and unexpected weight change.  HENT: Negative  for congestion, ear discharge, ear pain, nosebleeds, postnasal drip, rhinorrhea, sinus pain, sinus pressure, sneezing, sore throat, trouble swallowing and voice change.   Eyes: Negative.   Respiratory: Positive for cough, chest tightness, shortness of breath and wheezing. Negative for apnea, hemoptysis, choking and stridor.   Gastrointestinal: Positive for diarrhea. Negative for abdominal distention, abdominal pain, anal bleeding, blood in stool, constipation, heartburn, nausea, rectal pain and vomiting.  Neurological: Negative for dizziness, light-headedness and headaches.    Social History  Substance Use Topics  . Smoking status: Former Smoker    Packs/day: 1.50    Years: 8.00    Types: Cigarettes  . Smokeless tobacco: Never Used     Comment: quit 40 years ago  . Alcohol use No     Comment: rarely   Objective:   BP (!) 158/76 (BP Location: Left Arm, Patient Position: Sitting, Cuff Size: Large)   Pulse 84   Temp 98.4 F (36.9 C) (Oral)   Resp 16   Wt (!) 319 lb (144.7 kg)   SpO2 97%   BMI 47.11 kg/m   Physical Exam  Constitutional: He is oriented to person, place, and time. He appears well-developed and well-nourished. No distress.  HENT:  Right Ear: Tympanic membrane and external ear normal.  Left Ear: Tympanic membrane and external ear normal.  Nose: Nose normal. No rhinorrhea. Right sinus exhibits no maxillary sinus tenderness and no frontal  sinus tenderness. Left sinus exhibits no maxillary sinus tenderness and no frontal sinus tenderness.  Mouth/Throat: Uvula is midline, oropharynx is clear and moist and mucous membranes are normal. No oropharyngeal exudate.  Eyes: Conjunctivae are normal. Right eye exhibits no discharge. Left eye exhibits no discharge.     Neck: Normal range of motion. Neck supple.  Cardiovascular: Normal rate and regular rhythm.   Pulmonary/Chest: Effort normal and breath sounds normal. No respiratory distress. He has no wheezes. He has no rales.    Lymphadenopathy:    He has no cervical adenopathy.  Neurological: He is alert and oriented to person, place, and time.  Skin: Skin is warm and dry. He is not diaphoretic.  Psychiatric: He has a normal mood and affect. His behavior is normal.        Assessment & Plan:     1. Cough  Counseled patient on causes of cough. His duration of symptoms more aligns with post-infectious cough. Counseled patient this can linger around three weeks. No adverse sounds on lungs today and patient looks well in exam room. Discussed causes of chronic cough, for cough > 8 weeks, including PND and acid reflux. Offered patient CXR to r/o any continuous infectious process. Patient says he will do this. Declines cough suppressant. I do think this will likely resolve on his own, but patient can call back if it doesn't.  Return if symptoms worsen or fail to improve.   Patient Instructions  Upper Respiratory Infection, Adult Most upper respiratory infections (URIs) are caused by a virus. A URI affects the nose, throat, and upper air passages. The most common type of URI is often called "the common cold." Follow these instructions at home:  Take medicines only as told by your doctor.  Gargle warm saltwater or take cough drops to comfort your throat as told by your doctor.  Use a warm mist humidifier or inhale steam from a shower to increase air moisture. This may make it easier to breathe.  Drink enough fluid to keep your pee (urine) clear or pale yellow.  Eat soups and other clear broths.  Have a healthy diet.  Rest as needed.  Go back to work when your fever is gone or your doctor says it is okay.  You may need to stay home longer to avoid giving your URI to others.  You can also wear a face mask and wash your hands often to prevent spread of the virus.  Use your inhaler more if you have asthma.  Do not use any tobacco products, including cigarettes, chewing tobacco, or electronic cigarettes. If  you need help quitting, ask your doctor. Contact a doctor if:  You are getting worse, not better.  Your symptoms are not helped by medicine.  You have chills.  You are getting more short of breath.  You have brown or red mucus.  You have yellow or brown discharge from your nose.  You have pain in your face, especially when you bend forward.  You have a fever.  You have puffy (swollen) neck glands.  You have pain while swallowing.  You have white areas in the back of your throat. Get help right away if:  You have very bad or constant:  Headache.  Ear pain.  Pain in your forehead, behind your eyes, and over your cheekbones (sinus pain).  Chest pain.  You have long-lasting (chronic) lung disease and any of the following:  Wheezing.  Long-lasting cough.  Coughing up blood.  A change  in your usual mucus.  You have a stiff neck.  You have changes in your:  Vision.  Hearing.  Thinking.  Mood. This information is not intended to replace advice given to you by your health care provider. Make sure you discuss any questions you have with your health care provider. Document Released: 03/04/2008 Document Revised: 05/19/2016 Document Reviewed: 12/22/2013 Elsevier Interactive Patient Education  2017 [employer].    The entirety of the information documented in the History of Present Illness, Review of Systems and Physical Exam were personally obtained by me. Portions of this information were initially documented by Ashley Royalty, CMA and reviewed by me for thoroughness and accuracy.          Trinna Post, PA-C  [facility]

## 2016-10-21 NOTE — Patient Instructions (Signed)
Upper Respiratory Infection, Adult Most upper respiratory infections (URIs) are caused by a virus. A URI affects the nose, throat, and upper air passages. The most common type of URI is often called "the common cold." Follow these instructions at home:  Take medicines only as told by your doctor.  Gargle warm saltwater or take cough drops to comfort your throat as told by your doctor.  Use a warm mist humidifier or inhale steam from a shower to increase air moisture. This may make it easier to breathe.  Drink enough fluid to keep your pee (urine) clear or pale yellow.  Eat soups and other clear broths.  Have a healthy diet.  Rest as needed.  Go back to work when your fever is gone or your doctor says it is okay.  You may need to stay home longer to avoid giving your URI to others.  You can also wear a face mask and wash your hands often to prevent spread of the virus.  Use your inhaler more if you have asthma.  Do not use any tobacco products, including cigarettes, chewing tobacco, or electronic cigarettes. If you need help quitting, ask your doctor. Contact a doctor if:  You are getting worse, not better.  Your symptoms are not helped by medicine.  You have chills.  You are getting more short of breath.  You have brown or red mucus.  You have yellow or brown discharge from your nose.  You have pain in your face, especially when you bend forward.  You have a fever.  You have puffy (swollen) neck glands.  You have pain while swallowing.  You have white areas in the back of your throat. Get help right away if:  You have very bad or constant:  Headache.  Ear pain.  Pain in your forehead, behind your eyes, and over your cheekbones (sinus pain).  Chest pain.  You have long-lasting (chronic) lung disease and any of the following:  Wheezing.  Long-lasting cough.  Coughing up blood.  A change in your usual mucus.  You have a stiff neck.  You have  changes in your:  Vision.  Hearing.  Thinking.  Mood. This information is not intended to replace advice given to you by your health care provider. Make sure you discuss any questions you have with your health care provider. Document Released: 03/04/2008 Document Revised: 05/19/2016 Document Reviewed: 12/22/2013 Elsevier Interactive Patient Education  2017 Elsevier Inc.  

## 2016-10-28 ENCOUNTER — Telehealth: Payer: Self-pay

## 2016-10-28 ENCOUNTER — Ambulatory Visit (INDEPENDENT_AMBULATORY_CARE_PROVIDER_SITE_OTHER): Payer: BLUE CROSS/BLUE SHIELD | Admitting: Family Medicine

## 2016-10-28 VITALS — BP 184/70 | HR 88 | Temp 97.9°F | Resp 18 | Wt 316.0 lb

## 2016-10-28 DIAGNOSIS — I10 Essential (primary) hypertension: Secondary | ICD-10-CM | POA: Diagnosis not present

## 2016-10-28 DIAGNOSIS — R0602 Shortness of breath: Secondary | ICD-10-CM

## 2016-10-28 DIAGNOSIS — R05 Cough: Secondary | ICD-10-CM | POA: Diagnosis not present

## 2016-10-28 DIAGNOSIS — R059 Cough, unspecified: Secondary | ICD-10-CM

## 2016-10-28 DIAGNOSIS — Z794 Long term (current) use of insulin: Secondary | ICD-10-CM

## 2016-10-28 DIAGNOSIS — E118 Type 2 diabetes mellitus with unspecified complications: Secondary | ICD-10-CM | POA: Diagnosis not present

## 2016-10-28 MED ORDER — AZITHROMYCIN 250 MG PO TABS: ORAL_TABLET | ORAL | 0 refills | Status: DC

## 2016-10-28 NOTE — Progress Notes (Signed)
Peter Ryan  MRN: [id number] DOB: [birth year]  Subjective:  HPI   The patient is a 66 year old male who presents today with complaint of worsening shortness of breath with exertion, fatigue and cough.  The patient states he has had a cough for about a month.  He has tried a little (3 doses) of Robitussin and Allegra.  Neither of which helped.  He ws seen by one of the physician's assistants 1 week ago.  At that tine he states she told him his lungs were clear.  He was not prescribed anything except a cough syrup which he stated he would continue with what he was already using but he didn't really use.  He states she recommended trying an antihistamine which he has been using for about 1 week, he states he does not see any improvement.  The patient is concerned about the possibility of congestive heart failure.  The patient also states he does get swelling in his ankles where he notices it at his sock line,  He does note that getting up in the morning it is gone.  Patient Active Problem List   Diagnosis Date Noted  . Elevated PSA 05/30/2015  . BPH with obstruction/lower urinary tract symptoms 05/30/2015  . Type 2 diabetes mellitus ([place]) 05/18/2014  . Arthritis 01/03/2014  . Dyslipidemia 01/03/2014  . Benign essential HTN 01/03/2014  . Gout 01/03/2014  . Psoriasis 01/03/2014  . Reflux 01/03/2014    Past Medical History:  Diagnosis Date  . Arthritis   . BPH (benign prostatic hyperplasia)   . Chronic prostatitis   . Diabetes mellitus without complication ([place])   . Dyslipidemia   . Elevated PSA   . Gastric reflux   . Gout   . HTN (hypertension)   . Over weight   . Psoriasis   . Testicular pain, right   . Urinary frequency     Social History   Social History  . Marital status: Married    Spouse name: N/A  . Number of children: N/A  . Years of education: N/A   Occupational History  . Not on file.   Social History Main Topics  . Smoking status: Former Smoker   Packs/day: 1.50    Years: 8.00    Types: Cigarettes  . Smokeless tobacco: Never Used     Comment: quit 40 years ago  . Alcohol use No     Comment: rarely  . Drug use: No  . Sexual activity: Not on file   Other Topics Concern  . Not on file   Social History Narrative  . No narrative on file    Outpatient Encounter Prescriptions as of 10/28/2016  Medication Sig Note  . allopurinol (ZYLOPRIM) 300 MG tablet Take 1 tablet (300 mg total) by mouth daily.   Marland Kitchen amLODipine (NORVASC) 10 MG tablet Take 1 tablet (10 mg total) by mouth daily.   Marland Kitchen aspirin EC 81 MG tablet Take 81 mg by mouth daily.  05/23/2015: Received from: [place]  . Cholecalciferol (VITAMIN D3) 1000 UNITS CAPS Take 1,000 Units by mouth daily.  05/23/2015: Received from: [place]  . finasteride (PROSCAR) 5 MG tablet Take 1 tablet (5 mg total) by mouth daily.   . trandolapril (MAVIK) 4 MG tablet Take 1 tablet (4 mg total) by mouth 2 (two) times daily.    No facility-administered encounter medications on file as of 10/28/2016.     Allergies  Allergen Reactions  . Metformin Other (  See Comments)    Stomach upset     Review of Systems  Constitutional: Positive for malaise/fatigue. Negative for fever.  HENT: Negative for congestion, ear discharge, ear pain, hearing loss, nosebleeds, sinus pain, sore throat and tinnitus.   Eyes: Negative for blurred vision, double vision, photophobia, pain, discharge and redness.  Respiratory: Positive for cough, shortness of breath and wheezing. Negative for hemoptysis, sputum production and stridor.   Cardiovascular: Positive for orthopnea (not until he got sick this month, he has been sleeping inthe recliner.). Negative for chest pain and palpitations.  Neurological: Negative for dizziness, weakness and headaches.    Objective:  BP (!) 184/70 (BP Location: Right Arm, Patient Position: Sitting, Cuff Size: Normal)   Pulse 88   Temp 97.9 F (36.6 C)  (Oral)   Resp 18   Wt (!) 306 lb (138.8 kg)   SpO2 95%   BMI 45.19 kg/m   Physical Exam  Constitutional: He is well-developed, well-nourished, and in no distress.  HENT:  Head: Normocephalic and atraumatic.  Eyes: Pupils are equal, round, and reactive to light.  Neck: Normal range of motion.  Cardiovascular: Normal rate, regular rhythm and normal heart sounds.   Pulmonary/Chest: Effort normal and breath sounds normal.  Decreased breath sounds in the right base   Psychiatric: Mood, memory, affect and judgment normal.    Assessment and Plan :   1. Shortness of breath  - EKG 12-Lead - CBC with Differential/Platelet - Comprehensive metabolic panel - TSH - B Nat Peptide - DG Chest 2 View; Future  2. Type 2 diabetes mellitus with complication, with long-term current use of insulin (HCC)  - Hemoglobin A1c  3. Benign essential HTN  Follow up in 1 month  - CBC with Differential/Platelet - Comprehensive metabolic panel - TSH - B Nat Peptide  4. Cough  - azithromycin (ZITHROMAX) 250 MG tablet; 2 PO day 1 and then 1 daily  Dispense: 6 tablet; Refill: 0 5.Noncompliance    HPI, Exam and A&P Transcribed under the direction and in the presence of Miguel Aschoff, Brooke Bonito., MD. Electronically Signed: Althea Charon, RMA I have done the exam and reviewed the chart and it is accurate to the best of my knowledge. Development worker, community has been used and  any errors in dictation or transcription are unintentional. Miguel Aschoff M.D. [facility]

## 2016-10-28 NOTE — Telephone Encounter (Signed)
Pt called requesting OV for what he called possible heart failure. Pt is experiencing SOB (worse with exertion), 10-15 pound weight gain over the last 1-2 months, fatigue. Denies orthopnea, chest pain, heart palpitations. Sx have been occurring for about 2 weeks. Appointment scheduled for 3:30 today. Advised pt to go to ED if he notices any MI sx. Renaldo Fiddler, CMA

## 2016-10-29 ENCOUNTER — Ambulatory Visit
Admission: RE | Admit: 2016-10-29 | Discharge: 2016-10-29 | Disposition: A | Discharge status: Home / Self Care | Payer: BLUE CROSS/BLUE SHIELD | Source: Ambulatory Visit | Attending: Family Medicine | Admitting: Family Medicine

## 2016-10-29 DIAGNOSIS — J90 Pleural effusion, not elsewhere classified: Secondary | ICD-10-CM | POA: Diagnosis not present

## 2016-10-29 DIAGNOSIS — R918 Other nonspecific abnormal finding of lung field: Secondary | ICD-10-CM | POA: Insufficient documentation

## 2016-10-29 DIAGNOSIS — R0602 Shortness of breath: Secondary | ICD-10-CM | POA: Diagnosis present

## 2016-10-30 LAB — COMPREHENSIVE METABOLIC PANEL
ALBUMIN: 4.2 g/dL (ref 3.6–4.8)
ALT: 25 IU/L (ref 0–44)
AST: 22 IU/L (ref 0–40)
Albumin/Globulin Ratio: 1.6 (ref 1.2–2.2)
Alkaline Phosphatase: 62 IU/L (ref 39–117)
BILIRUBIN TOTAL: 0.3 mg/dL (ref 0.0–1.2)
BUN / CREAT RATIO: 11 (ref 10–24)
BUN: 10 mg/dL (ref 8–27)
CHLORIDE: 98 mmol/L (ref 96–106)
CO2: 26 mmol/L (ref 18–29)
CREATININE: 0.91 mg/dL (ref 0.76–1.27)
Calcium: 9.5 mg/dL (ref 8.6–10.2)
GFR, EST AFRICAN AMERICAN: 103 mL/min/{1.73_m2} (ref >59)
GFR, EST NON AFRICAN AMERICAN: 89 mL/min/{1.73_m2} (ref >59)
GLUCOSE: 111 mg/dL — AB (ref 65–99)
Globulin, Total: 2.7 g/dL (ref 1.5–4.5)
Potassium: 4.3 mmol/L (ref 3.5–5.2)
Sodium: 142 mmol/L (ref 134–144)
TOTAL PROTEIN: 6.9 g/dL (ref 6.0–8.5)

## 2016-10-30 LAB — CBC WITH DIFFERENTIAL/PLATELET
BASOS ABS: 0 10*3/uL (ref 0.0–0.2)
Basos: 0 %
EOS (ABSOLUTE): 0.2 10*3/uL (ref 0.0–0.4)
Eos: 2 %
HEMOGLOBIN: 15.5 g/dL (ref 13.0–17.7)
Hematocrit: 47.7 % (ref 37.5–51.0)
IMMATURE GRANULOCYTES: 0 %
Immature Grans (Abs): 0 10*3/uL (ref 0.0–0.1)
Lymphocytes Absolute: 1.5 10*3/uL (ref 0.7–3.1)
Lymphs: 18 %
MCH: 29.1 pg (ref 26.6–33.0)
MCHC: 32.5 g/dL (ref 31.5–35.7)
MCV: 90 fL (ref 79–97)
MONOCYTES: 10 %
Monocytes Absolute: 0.8 10*3/uL (ref 0.1–0.9)
NEUTROS ABS: 5.6 10*3/uL (ref 1.4–7.0)
NEUTROS PCT: 70 %
PLATELETS: 319 10*3/uL (ref 150–379)
RBC: 5.32 x10E6/uL (ref 4.14–5.80)
RDW: 14 % (ref 12.3–15.4)
WBC: 8.1 10*3/uL (ref 3.4–10.8)

## 2016-10-30 LAB — TSH: TSH: 1.42 u[IU]/mL (ref 0.450–4.500)

## 2016-10-30 LAB — HEMOGLOBIN A1C
Est. average glucose Bld gHb Est-mCnc: 134 mg/dL
Hgb A1c MFr Bld: 6.3 % — ABNORMAL HIGH (ref 4.8–5.6)

## 2016-10-30 LAB — BRAIN NATRIURETIC PEPTIDE: BNP: 6 pg/mL (ref 0.0–100.0)

## 2016-11-05 ENCOUNTER — Telehealth: Payer: Self-pay | Admitting: Family Medicine

## 2016-11-05 NOTE — Telephone Encounter (Signed)
Pt states he seen Dr Rosanna Randy a week ago for a cough and shortness of breath.  Pt has completed the Rx given and is is still having a cough.  Pt is asking if he will need a Rx to continue helping with this cough.  [facility].  [id number]

## 2016-11-05 NOTE — Telephone Encounter (Signed)
Try Tessalon Perles. We can also refer to pulmonology

## 2016-11-05 NOTE — Telephone Encounter (Signed)
Pt states his cough is dry. SOB still present, but improved. Afebrile. Renaldo Fiddler, CMA

## 2016-11-05 NOTE — Telephone Encounter (Signed)
Pt reports he does not need a cough suppressant because the cough is not very frequent. He would like to see if the cough/SOB improves before seeing pulmonology. Advised pt to call back in a week if he is not improved, and we will refer. Renaldo Fiddler, CMA

## 2016-11-06 ENCOUNTER — Telehealth: Payer: Self-pay

## 2016-11-06 DIAGNOSIS — R0602 Shortness of breath: Secondary | ICD-10-CM

## 2016-11-06 DIAGNOSIS — R059 Cough, unspecified: Secondary | ICD-10-CM

## 2016-11-06 DIAGNOSIS — R05 Cough: Secondary | ICD-10-CM

## 2016-11-06 NOTE — Telephone Encounter (Signed)
Patient agrees to proceed with pulmonary referral that was recommended on 11/05/2016. Pulmonology referral ordered.

## 2016-11-08 ENCOUNTER — Ambulatory Visit
Admission: RE | Admit: 2016-11-08 | Discharge: 2016-11-08 | Disposition: A | Discharge status: Home / Self Care | Payer: BLUE CROSS/BLUE SHIELD | Source: Ambulatory Visit | Attending: Pulmonary Disease | Admitting: Pulmonary Disease

## 2016-11-08 ENCOUNTER — Ambulatory Visit (INDEPENDENT_AMBULATORY_CARE_PROVIDER_SITE_OTHER): Payer: BLUE CROSS/BLUE SHIELD | Admitting: Pulmonary Disease

## 2016-11-08 ENCOUNTER — Encounter: Payer: Self-pay | Admitting: Pulmonary Disease

## 2016-11-08 VITALS — BP 146/88 | HR 90 | Wt 311.0 lb

## 2016-11-08 DIAGNOSIS — R0609 Other forms of dyspnea: Secondary | ICD-10-CM | POA: Diagnosis not present

## 2016-11-08 DIAGNOSIS — J90 Pleural effusion, not elsewhere classified: Secondary | ICD-10-CM

## 2016-11-08 DIAGNOSIS — R05 Cough: Secondary | ICD-10-CM | POA: Diagnosis not present

## 2016-11-08 DIAGNOSIS — R059 Cough, unspecified: Secondary | ICD-10-CM

## 2016-11-08 DIAGNOSIS — R918 Other nonspecific abnormal finding of lung field: Secondary | ICD-10-CM | POA: Diagnosis not present

## 2016-11-08 NOTE — Patient Instructions (Signed)
You have a moderate collection of fluid around your right lung which is the likely cause of shortness of breath (and maybe cough). The cause of this is unknown at present.   Plan: 1) thoracentesis - this is a procedure where the interventional radiologist will place a needle in the fluid collection and drain it off. This fluid will be sent for analysis to help [place] understand why it is there.   2) CT scan of chest after the thoracentesis to get a better, more complete image of your lungs, again trying to explain why that fluid is there

## 2016-11-12 ENCOUNTER — Other Ambulatory Visit: Payer: Self-pay | Admitting: Pulmonary Disease

## 2016-11-12 ENCOUNTER — Ambulatory Visit
Admission: RE | Admit: 2016-11-12 | Discharge: 2016-11-12 | Disposition: A | Discharge status: Home / Self Care | Payer: BLUE CROSS/BLUE SHIELD | Source: Ambulatory Visit | Attending: Pulmonary Disease | Admitting: Pulmonary Disease

## 2016-11-12 DIAGNOSIS — R600 Localized edema: Secondary | ICD-10-CM | POA: Insufficient documentation

## 2016-11-12 DIAGNOSIS — J9811 Atelectasis: Secondary | ICD-10-CM | POA: Diagnosis not present

## 2016-11-12 DIAGNOSIS — R590 Localized enlarged lymph nodes: Secondary | ICD-10-CM | POA: Insufficient documentation

## 2016-11-12 DIAGNOSIS — J90 Pleural effusion, not elsewhere classified: Secondary | ICD-10-CM | POA: Insufficient documentation

## 2016-11-12 LAB — BODY FLUID CELL COUNT WITH DIFFERENTIAL
Eos, Fluid: 0 %
LYMPHS FL: 86 %
Monocyte-Macrophage-Serous Fluid: 11 %
Neutrophil Count, Fluid: 3 %
Total Nucleated Cell Count, Fluid: 2560 cu mm

## 2016-11-12 LAB — PATHOLOGIST SMEAR REVIEW

## 2016-11-12 LAB — LACTATE DEHYDROGENASE, PLEURAL OR PERITONEAL FLUID: LD FL: 165 U/L — AB (ref 3–23)

## 2016-11-12 MED ORDER — IOPAMIDOL (ISOVUE-300) INJECTION 61%
75.0000 mL | Freq: Once | INTRAVENOUS | Status: AC | PRN
  Administered 2016-11-12: 75 mL via INTRAVENOUS

## 2016-11-14 NOTE — Progress Notes (Signed)
PULMONARY CONSULT NOTE  Requesting MD/Service: Rosanna Randy Date of initial consultation: 11/09/15 Reason for consultation: Cough, dyspnea  PT PROFILE: 36 M with no prior pulmonary history referred for cough and dyspnea X 6 weeks  HPI:  89 M never smoker suffered "cold" after Xmas with "bad cough" with SOB and chest tightness that lingered. He took a Zpak 2 weeks ago. Cough is now improving but persists throughout the day. It is nonproductive, sometimes throat clearing and sometimes deeper. He has mild SOB but is not limiting.   Past Medical History:  Diagnosis Date  . Arthritis   . BPH (benign prostatic hyperplasia)   . Chronic prostatitis   . Diabetes mellitus without complication ([place])   . Dyslipidemia   . Elevated PSA   . Gastric reflux   . Gout   . HTN (hypertension)   . Over weight   . Psoriasis   . Testicular pain, right   . Urinary frequency     Past Surgical History:  Procedure Laterality Date  . hydrocelectomy    . PILONIDAL CYST EXCISION      MEDICATIONS: I have reviewed all medications and confirmed regimen as documented  Social History   Social History  . Marital status: Married    Spouse name: N/A  . Number of children: N/A  . Years of education: N/A   Occupational History  . Not on file.   Social History Main Topics  . Smoking status: Former Smoker    Packs/day: 1.50    Years: 8.00    Types: Cigarettes  . Smokeless tobacco: Never Used     Comment: quit 40 years ago  . Alcohol use No     Comment: rarely  . Drug use: No  . Sexual activity: Not on file   Other Topics Concern  . Not on file   Social History Narrative  . No narrative on file    Family History  Problem Relation Age of Onset  . Uterine cancer Mother   . Diabetes Mother   . Bladder Cancer Neg Hx   . Kidney disease Neg Hx   . Prostate cancer Neg Hx     ROS: No fever, myalgias/arthralgias, unexplained weight loss or weight gain No new focal weakness or sensory deficits No  otalgia, hearing loss, visual changes, nasal and sinus symptoms, mouth and throat problems No neck pain or adenopathy No abdominal pain, N/V/D, diarrhea, change in bowel pattern No dysuria, change in urinary pattern   Vitals:   11/08/16 1402 11/08/16 1404  BP:  (!) 146/88  Pulse:  90  SpO2:  96%  Weight: (!) 311 lb (141.1 kg)      EXAM:  Gen: Obese, No overt respiratory distress HEENT: NCAT, sclera white, oropharynx normal Neck: Supple without LAN, thyromegaly, JVD Lungs: Dull to percussion with bronchial BS approx 1/2 up on R. No wheezes Cardiovascular: RRR, no murmurs noted Abdomen: Soft, nontender, normal BS Ext: without clubbing, cyanosis. Symmetric BLE edema Neuro: CNs grossly intact, motor and sensory intact Skin: Limited exam, no lesions noted  DATA:   BMP Latest Ref Rng & Units 10/29/2016 07/18/2016  Glucose 65 - 99 mg/dL 111(H) 137(H)  BUN 8 - 27 mg/dL 10 10  Creatinine 0.76 - 1.27 mg/dL 0.91 0.80  BUN/Creat Ratio 10 - 24 11 13   Sodium 134 - 144 mmol/L 142 143  Potassium 3.5 - 5.2 mmol/L 4.3 4.2  Chloride 96 - 106 mmol/L 98 98  CO2 18 - 29 mmol/L 26 23  Calcium  8.6 - 10.2 mg/dL 9.5 8.9    CBC Latest Ref Rng & Units 10/29/2016 07/18/2016  WBC 3.4 - 10.8 x10E3/uL 8.1 6.3  Hematocrit 37.5 - 51.0 % 47.7 44.6  Platelets 150 - 379 x10E3/uL 319 246   CXR (10/29/16): Small R pleural effusion CXR 11/08/16: moderate R pleural effusion  IMPRESSION:     ICD-9-CM ICD-10-CM   1. Cough 786.2 R05 Spirometry with Graph  2. Pleural effusion 511.9 J90 DG Chest 2 View     US THORACENTESIS ASP PLEURAL SPACE W/IMG GUIDE     CT CHEST W CONTRAST  3. Dyspnea on exertion 786.09 R06.09    Based on exam findings, CXR was performed on the day of this evaluation which revealed moderately large R pleural effusion  PLAN:  1) US guided thoracentesis 2) CT chest to be performed after thoracentesis 3) Follow up 02/16 to review results   Merton Border, MD PCCM service Mobile  [phone] Pager [phone] 11/14/2016

## 2016-11-15 ENCOUNTER — Encounter: Payer: Self-pay | Admitting: Pulmonary Disease

## 2016-11-15 ENCOUNTER — Ambulatory Visit (INDEPENDENT_AMBULATORY_CARE_PROVIDER_SITE_OTHER): Payer: BLUE CROSS/BLUE SHIELD | Admitting: Pulmonary Disease

## 2016-11-15 ENCOUNTER — Other Ambulatory Visit
Admission: RE | Admit: 2016-11-15 | Discharge: 2016-11-15 | Disposition: A | Discharge status: Home / Self Care | Payer: BLUE CROSS/BLUE SHIELD | Source: Ambulatory Visit | Attending: Pulmonary Disease | Admitting: Pulmonary Disease

## 2016-11-15 VITALS — BP 134/82 | HR 89 | Wt 308.0 lb

## 2016-11-15 DIAGNOSIS — E669 Obesity, unspecified: Secondary | ICD-10-CM

## 2016-11-15 DIAGNOSIS — R0609 Other forms of dyspnea: Secondary | ICD-10-CM

## 2016-11-15 DIAGNOSIS — R05 Cough: Secondary | ICD-10-CM | POA: Diagnosis not present

## 2016-11-15 DIAGNOSIS — J90 Pleural effusion, not elsewhere classified: Secondary | ICD-10-CM | POA: Diagnosis not present

## 2016-11-15 DIAGNOSIS — R059 Cough, unspecified: Secondary | ICD-10-CM

## 2016-11-15 LAB — BODY FLUID CULTURE: CULTURE: NO GROWTH

## 2016-11-15 LAB — CHOLESTEROL, BODY FLUID: Cholesterol, Fluid: 123 mg/dL

## 2016-11-15 NOTE — Patient Instructions (Signed)
Tuberculosis blood test today  Chest Xray in 2 to 3 weeks  Follow up after chest Xray

## 2016-11-17 NOTE — Progress Notes (Signed)
PULMONARY OFFICE FOLLOW UP NOTE  Requesting MD/Service: Rosanna Randy Date of initial consultation: 11/09/15 Reason for consultation: Cough, dyspnea  PT PROFILE: 57 M with no prior pulmonary history referred for cough and dyspnea X 6 weeks. CXR revealed moderate R pleural effusion  DATA: Thoracentesis 11/12/16: 1950 mL of opaque amber pleural fluid was removed. Exudative chemistries (LDH 165, cholesterol 123). WBC 2560, 86% lymphs. Cytology: negative for malignancy, reactive meso cells, predominantly lymphocytes CT chest 11/12/16: post thoracentesis: Status post right thoracentesis with resolution of right pleural effusion. Minimal R basilar atelectasis is noted as well as some mild R lower lobe re-expansion edema. Scattered mediastinal lymph nodes as well as lymphadenopathy adjacent to the pericardium. These are significant by size criteria.   SUBJ:  Dyspnea and cough are much improved after thoracentesis. No new complaints. Denies CP, fever, purulent sputum, hemoptysis and calf tenderness. Has chronic LE edema - unchanged  Vitals:   11/15/16 1311  BP: 134/82  Pulse: 89  SpO2: 94%  Weight: (!) 308 lb (139.7 kg)   Obese, NAD HEENT WNL No JVD or LAN R basilar crackles and possibly some dullness in R base Reg, no M NABS 1+ symmetric pretibial edema  DATA:  Thoracentesis results and CT results as above  BMP Latest Ref Rng & Units 10/29/2016 07/18/2016  Glucose 65 - 99 mg/dL 111(H) 137(H)  BUN 8 - 27 mg/dL 10 10  Creatinine 0.76 - 1.27 mg/dL 0.91 0.80  BUN/Creat Ratio 10 - 24 11 13   Sodium 134 - 144 mmol/L 142 143  Potassium 3.5 - 5.2 mmol/L 4.3 4.2  Chloride 96 - 106 mmol/L 98 98  CO2 18 - 29 mmol/L 26 23  Calcium 8.6 - 10.2 mg/dL 9.5 8.9    CBC Latest Ref Rng & Units 10/29/2016 07/18/2016  WBC 3.4 - 10.8 x10E3/uL 8.1 6.3  Hematocrit 37.5 - 51.0 % 47.7 44.6  Platelets 150 - 379 x10E3/uL 319 246   CXR: NNF  IMPRESSION:   1) Dyspnea and cough much improved after  thoracentesis 2) Large lymphocyte predominant R pleural effusion of unclear etiology 3) Obesity - a mild contributor to DOE Based on exam findings, effusion might be partially re-accumulated  We discussed the current findings and the implications of these findings. He is somewhat concerned about malignancy. I explained that I cannot absolutely rule out a malignancy, I am not currently leaning towards that as a likelihood (although lymphoma is a possibility)   PLAN:  1) quantiferon gold ordered 2) ROV 2-3 weeks with repeat CXR  Merton Border, MD PCCM service Mobile [phone] Pager [phone] 11/17/2016

## 2016-11-18 ENCOUNTER — Other Ambulatory Visit
Admission: RE | Admit: 2016-11-18 | Discharge: 2016-11-18 | Disposition: A | Discharge status: Home / Self Care | Payer: BLUE CROSS/BLUE SHIELD | Source: Ambulatory Visit | Attending: Pulmonary Disease | Admitting: Pulmonary Disease

## 2016-11-18 DIAGNOSIS — J90 Pleural effusion, not elsewhere classified: Secondary | ICD-10-CM | POA: Diagnosis present

## 2016-11-20 LAB — QUANTIFERON IN TUBE
QFT TB AG MINUS NIL VALUE: 0 [IU]/mL
QUANTIFERON MITOGEN VALUE: 3.93 [IU]/mL
QUANTIFERON NIL VALUE: 0.03 [IU]/mL
QUANTIFERON TB AG VALUE: 0.03 IU/mL
QUANTIFERON TB GOLD: NEGATIVE

## 2016-11-20 LAB — QUANTIFERON TB GOLD ASSAY (BLOOD)

## 2016-11-25 ENCOUNTER — Ambulatory Visit: Payer: BLUE CROSS/BLUE SHIELD | Admitting: Family Medicine

## 2016-11-25 ENCOUNTER — Telehealth: Payer: Self-pay | Admitting: Pulmonary Disease

## 2016-11-25 NOTE — Telephone Encounter (Signed)
Pt aware of DS's recommendations & voiced his understanding. Nothing further needed.

## 2016-11-25 NOTE — Telephone Encounter (Signed)
Spoke with pt, who c/o increased sob & occ mild non prod cough X 1w. Pt is concerned that he may be developing fluid buildup on his lungs again.  DS please advise. Thanks.

## 2016-11-25 NOTE — Telephone Encounter (Signed)
I have placed order for repeat chest Xray. He should get it today or tomorrow. I will call him after I look at him to decide what to do next  [facility]

## 2016-11-25 NOTE — Telephone Encounter (Signed)
Pt calling stating his symptoms are getting bad again He thinks he may have fluid in lungs again He is getting more sob again. Please advise.

## 2016-11-26 ENCOUNTER — Ambulatory Visit
Admission: RE | Admit: 2016-11-26 | Discharge: 2016-11-26 | Disposition: A | Discharge status: Home / Self Care | Payer: BLUE CROSS/BLUE SHIELD | Source: Ambulatory Visit | Attending: Pulmonary Disease | Admitting: Pulmonary Disease

## 2016-11-26 ENCOUNTER — Other Ambulatory Visit: Payer: Self-pay | Admitting: *Deleted

## 2016-11-26 ENCOUNTER — Telehealth: Payer: Self-pay | Admitting: Pulmonary Disease

## 2016-11-26 DIAGNOSIS — J90 Pleural effusion, not elsewhere classified: Secondary | ICD-10-CM | POA: Diagnosis not present

## 2016-11-26 NOTE — Telephone Encounter (Signed)
Pt informed of thoracentesis appt for 11/28/16 @ 2pm. Nothing further needed.

## 2016-11-26 NOTE — Telephone Encounter (Signed)
I have reviewed repeat CXR results with Peter Ryan. Ria Bush will repeat the thoracentesis  And include ADA, AFB smear and culture and flow cytometry. He has appointment with me 12/20/16 and we will keep that appointment as scheduled  Merton Border, MD PCCM service Mobile [phone] Pager [phone] 11/26/2016

## 2016-11-26 NOTE — Telephone Encounter (Signed)
Peter Ryan,  Thoracentesis has been ordered per DS. Please schedule.

## 2016-11-26 NOTE — Discharge Instructions (Signed)
Thoracentesis, Care After °Refer to this sheet in the next few weeks. These instructions provide you with information about caring for yourself after your procedure. Your health care provider may also give you more specific instructions. Your treatment has been planned according to current medical practices, but problems sometimes occur. Call your health care provider if you have any problems or questions after your procedure. °What can I expect after the procedure? °After your procedure, it is common to have pain at the puncture site. °Follow these instructions at home: °· Take medicines only as directed by your health care provider. °· You may return to your normal diet and normal activities as directed by your health care provider. °· Drink enough fluid to keep your urine clear or pale yellow. °· Do not take baths, swim, or use a hot tub until your health care provider approves. °· Follow your health care provider's instructions about: °¨ Puncture site care. °¨ Bandage (dressing) changes and removal. °· Check your puncture site every day for signs of infection. Watch for: °¨ Redness, swelling, or pain. °¨ Fluid, blood, or pus. °· Keep all follow-up visits as directed by your health care provider. This is important. °Contact a health care provider if: °· You have redness, swelling, or pain at your puncture site. °· You have fluid, blood, or pus coming from your puncture site. °· You have a fever. °· You have chills. °· You have nausea or vomiting. °· You have trouble breathing. °· You develop a worsening cough. °Get help right away if: °· You have extreme shortness of breath. °· You develop chest pain. °· You faint or feel light-headed. °This information is not intended to replace advice given to you by your health care provider. Make sure you discuss any questions you have with your health care provider. °Document Released: 10/07/2014 Document Revised: 05/18/2016 Document Reviewed: 06/28/2014 °Elsevier  Interactive Patient Education © 2017 Elsevier Inc. ° °

## 2016-11-28 ENCOUNTER — Ambulatory Visit
Admission: RE | Admit: 2016-11-28 | Discharge: 2016-11-28 | Disposition: A | Discharge status: Home / Self Care | Payer: BLUE CROSS/BLUE SHIELD | Source: Ambulatory Visit | Attending: Interventional Radiology | Admitting: Interventional Radiology

## 2016-11-28 ENCOUNTER — Other Ambulatory Visit: Payer: Self-pay | Admitting: *Deleted

## 2016-11-28 ENCOUNTER — Ambulatory Visit
Admission: RE | Admit: 2016-11-28 | Discharge: 2016-11-28 | Disposition: A | Discharge status: Home / Self Care | Payer: BLUE CROSS/BLUE SHIELD | Source: Ambulatory Visit | Attending: Pulmonary Disease | Admitting: Pulmonary Disease

## 2016-11-28 DIAGNOSIS — Z9889 Other specified postprocedural states: Secondary | ICD-10-CM

## 2016-11-28 DIAGNOSIS — J90 Pleural effusion, not elsewhere classified: Secondary | ICD-10-CM | POA: Diagnosis not present

## 2016-11-28 LAB — BODY FLUID CELL COUNT WITH DIFFERENTIAL
EOS FL: 0 %
Lymphs, Fluid: 69 %
Monocyte-Macrophage-Serous Fluid: 25 %
Neutrophil Count, Fluid: 6 %
OTHER CELLS FL: 0 %
WBC FLUID: 2801 uL

## 2016-11-28 LAB — PROTEIN, PLEURAL OR PERITONEAL FLUID: Total protein, fluid: 4.9 g/dL

## 2016-11-28 LAB — LACTATE DEHYDROGENASE, PLEURAL OR PERITONEAL FLUID: LD, Fluid: 165 U/L — ABNORMAL HIGH (ref 3–23)

## 2016-11-28 NOTE — Procedures (Signed)
Interventional Radiology Procedure Note  Procedure: US guided right thoracentesis  Complications: None  Estimated Blood Loss: < 10 mL  1.8 L of fluid removed from right pleural space.  Venetia Night. Kathlene Cote, M.D Pager:  [phone]

## 2016-11-30 LAB — ACID FAST SMEAR (AFB): ACID FAST SMEAR - AFSCU2: NEGATIVE

## 2016-11-30 LAB — ACID FAST SMEAR (AFB, MYCOBACTERIA)

## 2016-12-01 LAB — ADENOSIDE DEAMINASE, PLEURAL FL: ADENOSIDE DEAMINASE, PLEURAL FL: 2 U/L (ref 0.0–9.4)

## 2016-12-02 ENCOUNTER — Ambulatory Visit (INDEPENDENT_AMBULATORY_CARE_PROVIDER_SITE_OTHER): Payer: BLUE CROSS/BLUE SHIELD | Admitting: Family Medicine

## 2016-12-02 VITALS — BP 138/86 | HR 77 | Temp 97.9°F | Resp 18 | Wt 298.0 lb

## 2016-12-02 DIAGNOSIS — R0602 Shortness of breath: Secondary | ICD-10-CM | POA: Diagnosis not present

## 2016-12-02 DIAGNOSIS — R059 Cough, unspecified: Secondary | ICD-10-CM

## 2016-12-02 DIAGNOSIS — E118 Type 2 diabetes mellitus with unspecified complications: Secondary | ICD-10-CM

## 2016-12-02 DIAGNOSIS — R05 Cough: Secondary | ICD-10-CM | POA: Diagnosis not present

## 2016-12-02 DIAGNOSIS — Z794 Long term (current) use of insulin: Secondary | ICD-10-CM | POA: Diagnosis not present

## 2016-12-02 LAB — COMP PANEL: LEUKEMIA/LYMPHOMA

## 2016-12-02 MED ORDER — LOSARTAN POTASSIUM 50 MG PO TABS: 50.0000 mg | ORAL_TABLET | Freq: Every day | ORAL | 3 refills | Status: DC

## 2016-12-02 NOTE — Progress Notes (Signed)
Peter Ryan  MRN: [id number] DOB: May 31, 1951  Subjective:  HPI  Patient is here for 1 month follow up on SOB and cough. Labs were done on last visit including BNP- in January 2018 and were ok. EKG was done. Chest Xray-showed possible pneumonia. Patient was given Zpak at that visit. Patient did get referred to Pulmonologist and had thoracentesis done due to fluid on his lungs twice-keeps reoccur. and waiting on pathology of the last one. SOB is better and cough is usually present in the mornings. Patient Active Problem List   Diagnosis Date Noted  . 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  . DOE (dyspnea on exertion) 11/15/2016  . Obesity 11/15/2016  . Elevated PSA 05/30/2015  . BPH with obstruction/lower urinary tract symptoms 05/30/2015  . Type 2 diabetes mellitus (Peter Ryan) 05/18/2014  . Arthritis 01/03/2014  . Dyslipidemia 01/03/2014  . Benign essential HTN 01/03/2014  . Gout 01/03/2014  . Psoriasis 01/03/2014  . Reflux 01/03/2014    Past Medical History:  Diagnosis Date  . Arthritis   . BPH (benign prostatic hyperplasia)   . Chronic prostatitis   . Diabetes mellitus without complication (Peter Ryan)   . Dyslipidemia   . Elevated PSA   . Gastric reflux   . Gout   . HTN (hypertension)   . Over weight   . Psoriasis   . Testicular pain, right   . Urinary frequency     Social History   Social History  . Marital status: Married    Spouse name: N/A  . Number of children: N/A  . Years of education: N/A   Occupational History  . Not on file.   Social History Main Topics  . Smoking status: Former Smoker    Packs/day: 1.50    Years: 8.00    Types: Cigarettes  . Smokeless tobacco: Never Used     Comment: quit 40 years ago  . Alcohol use No     Comment: rarely  . Drug use: No  . Sexual activity: Not on file   Other Topics Concern  . Not on file   Social History Narrative  . No narrative on file    Outpatient Encounter Prescriptions as of  12/02/2016  Medication Sig Note  . allopurinol (ZYLOPRIM) 300 MG tablet Take 1 tablet (300 mg total) by mouth daily.   Marland Kitchen amLODipine (NORVASC) 10 MG tablet Take 1 tablet (10 mg total) by mouth daily.   Marland Kitchen aspirin EC 81 MG tablet Take 81 mg by mouth daily.  05/23/2015: Received from: [facility]  . Cholecalciferol (VITAMIN D3) 1000 UNITS CAPS Take 1,000 Units by mouth daily.  05/23/2015: Received from: [facility]  . finasteride (PROSCAR) 5 MG tablet Take 1 tablet (5 mg total) by mouth daily.   . trandolapril (MAVIK) 4 MG tablet Take 1 tablet (4 mg total) by mouth 2 (two) times daily.    No facility-administered encounter medications on file as of 12/02/2016.     Allergies  Allergen Reactions  . Metformin Other (See Comments)    Stomach upset     Review of Systems  Constitutional: Negative.        About the same as usual  Respiratory: Positive for cough (in the morning usually).   Cardiovascular: Negative.   Gastrointestinal: Negative.   Musculoskeletal: Negative.   Neurological: Negative.     Objective:  BP 138/86   Pulse 77   Temp 97.9 F (36.6 C)   Resp 18  Wt 298 lb (135.2 kg)   SpO2 95%   BMI 44.01 kg/m   Physical Exam  Constitutional: He is oriented to person, place, and time and well-developed, well-nourished, and in no distress.  HENT:  Head: Normocephalic and atraumatic.  Right Ear: External ear normal.  Left Ear: External ear normal.  Nose: Nose normal.  Eyes: Conjunctivae are normal. No scleral icterus.  Cardiovascular: Normal rate, regular rhythm and normal heart sounds.   Pulmonary/Chest: Effort normal and breath sounds normal.  Abdominal: Soft.  Neurological: He is alert and oriented to person, place, and time. Gait normal. GCS score is 15.  Skin: Skin is warm and dry.  Psychiatric: Mood, memory, affect and judgment normal.    Assessment and Plan :  1. Cough Following pulmonologist still. Will switch b/p medication and  see if this is contributing to his symptoms. 2. SOB (shortness of breath)  3. Type 2 diabetes mellitus with complication, with long-term current use of insulin (Peter Ryan) 4.Morbid Obesity HPI, Exam and A&P transcribed under direction and in the presence of Miguel Aschoff, MD. I have done the exam and reviewed the chart and it is accurate to the best of my knowledge. Development worker, community has been used and  any errors in dictation or transcription are unintentional. Miguel Aschoff M.D. [facility]

## 2016-12-03 ENCOUNTER — Ambulatory Visit: Payer: BLUE CROSS/BLUE SHIELD

## 2016-12-03 ENCOUNTER — Encounter: Payer: Self-pay | Admitting: Family Medicine

## 2016-12-03 LAB — CYTOLOGY - NON PAP

## 2016-12-05 ENCOUNTER — Telehealth: Payer: Self-pay | Admitting: *Deleted

## 2016-12-05 NOTE — Telephone Encounter (Signed)
Pt has called asking if you have any results from his procedure and if so if we would call him with those. Let me know if I can assist with that.

## 2016-12-06 NOTE — Telephone Encounter (Signed)
I will call him  Peter Ryan

## 2016-12-13 ENCOUNTER — Ambulatory Visit: Payer: BLUE CROSS/BLUE SHIELD | Admitting: Pulmonary Disease

## 2016-12-20 ENCOUNTER — Ambulatory Visit (INDEPENDENT_AMBULATORY_CARE_PROVIDER_SITE_OTHER): Payer: BLUE CROSS/BLUE SHIELD | Admitting: Pulmonary Disease

## 2016-12-20 ENCOUNTER — Ambulatory Visit
Admission: RE | Admit: 2016-12-20 | Discharge: 2016-12-20 | Disposition: A | Discharge status: Home / Self Care | Payer: BLUE CROSS/BLUE SHIELD | Source: Ambulatory Visit | Attending: Pulmonary Disease | Admitting: Pulmonary Disease

## 2016-12-20 ENCOUNTER — Encounter: Payer: Self-pay | Admitting: Pulmonary Disease

## 2016-12-20 VITALS — BP 144/88 | HR 85 | Wt 288.0 lb

## 2016-12-20 DIAGNOSIS — J90 Pleural effusion, not elsewhere classified: Secondary | ICD-10-CM | POA: Diagnosis not present

## 2016-12-20 DIAGNOSIS — R0609 Other forms of dyspnea: Secondary | ICD-10-CM | POA: Diagnosis not present

## 2016-12-20 DIAGNOSIS — Z9889 Other specified postprocedural states: Secondary | ICD-10-CM | POA: Insufficient documentation

## 2016-12-20 DIAGNOSIS — J9811 Atelectasis: Secondary | ICD-10-CM | POA: Diagnosis not present

## 2016-12-20 NOTE — Patient Instructions (Signed)
Follow up in 3 months with CXR

## 2016-12-20 NOTE — Progress Notes (Signed)
PULMONARY OFFICE FOLLOW UP NOTE  Requesting MD/Service: Rosanna Randy Date of initial consultation: 11/09/15 Reason for consultation: Cough, dyspnea  PT PROFILE: 60 M with no prior pulmonary history referred for cough and dyspnea X 6 weeks. CXR revealed moderate R pleural effusion  DATA: Thoracentesis 11/12/16: 1950 mL of opaque amber pleural fluid was removed. Exudative chemistries (LDH 165, cholesterol 123). WBC 2560, 86% lymphs. Cytology: negative for malignancy, reactive meso cells, predominantly lymphocytes CT chest 11/12/16: post thoracentesis: Status post right thoracentesis with resolution of right pleural effusion. Minimal R basilar atelectasis is noted as well as some mild R lower lobe re-expansion edema. Scattered mediastinal lymph nodes as well as lymphadenopathy adjacent to the pericardium. These are significant by size criteria.  Office encounter 11/15/16: Symptoms much improved after thoracentesis. Pleural fluid analysis as noted above. Telephone encounter 11/25/16: Increasing shortness of breath and chest fullness Chest x-ray 11/26/16: Recurrence of right pleural effusion with minimal blunting of the left costophrenic angle Repeat thoracentesis 11/28/16: 1800 mL removed, LDH 165, total protein 4.9, white blood cell count 2801, 69% lymphocytes. Cytology negative, flow cytometry negative. Adenosine Deaminase 2.0 (not elevated).  Chest x-ray 12/20/16: Minimal blunting of bilateral costophrenic angles  SUBJ:  His breathing is back to his baseline. He does not feel symptoms of reaccumulation of pleural fluid. He has no new complaints. Denies CP, fever, purulent sputum, hemoptysis, LE edema and calf tenderness   Vitals:   12/20/16 1150  BP: (!) 144/88  Pulse: 85  SpO2: 97%  Weight: 288 lb (130.6 kg)   Obese, NAD HEENT WNL No JVD or LAN Breath sounds full without adventitious sounds Reg, no M NABS Minimal symmetric pretibial edema  DATA:   BMP Latest Ref Rng & Units  10/29/2016 07/18/2016  Glucose 65 - 99 mg/dL 111(H) 137(H)  BUN 8 - 27 mg/dL 10 10  Creatinine 0.76 - 1.27 mg/dL 0.91 0.80  BUN/Creat Ratio 10 - 24 11 13   Sodium 134 - 144 mmol/L 142 143  Potassium 3.5 - 5.2 mmol/L 4.3 4.2  Chloride 96 - 106 mmol/L 98 98  CO2 18 - 29 mmol/L 26 23  Calcium 8.6 - 10.2 mg/dL 9.5 8.9    CBC Latest Ref Rng & Units 10/29/2016 07/18/2016  WBC 3.4 - 10.8 x10E3/uL 8.1 6.3  Hematocrit 37.5 - 51.0 % 47.7 44.6  Platelets 150 - 379 x10E3/uL 319 246   Pleural fluid analysis results as above CXR: As above  IMPRESSION:   1)  Large lymphocyte predominant R pleural effusion. It does not appear to be reaccumulating since most recent thoracentesis on 03/01. Etiology remains unclear. This is not due to heart failure and does not appear to be due to malignancy. The low ADA makes tuberculosis effusion unlikely. 2) chronic exertional dyspnea due to obesity   PLAN:  Expectant management Follow-up in 3 months with repeat chest x-ray If at any time he feels like a pleural effusion is recurring, he is to contact this office. At that point, the appropriate next step would be referral to thoracic surgery for VATS I encouraged weight loss and we discussed strategies  Merton Border, MD PCCM service Mobile [phone] Pager [phone] 12/20/2016

## 2016-12-23 ENCOUNTER — Other Ambulatory Visit: Payer: Self-pay | Admitting: Family Medicine

## 2016-12-23 DIAGNOSIS — I10 Essential (primary) hypertension: Secondary | ICD-10-CM

## 2017-01-09 ENCOUNTER — Other Ambulatory Visit: Payer: BLUE CROSS/BLUE SHIELD

## 2017-01-16 ENCOUNTER — Ambulatory Visit: Payer: BLUE CROSS/BLUE SHIELD | Admitting: Urology

## 2017-01-20 ENCOUNTER — Telehealth: Payer: Self-pay | Admitting: Pulmonary Disease

## 2017-01-20 DIAGNOSIS — J90 Pleural effusion, not elsewhere classified: Secondary | ICD-10-CM

## 2017-01-20 NOTE — Telephone Encounter (Signed)
Pt informed of response and has been scheduled. Will get CXR prior to appt.

## 2017-01-20 NOTE — Telephone Encounter (Signed)
Pt states he feels as if the fluid is coming back around his lungs,. Please call.

## 2017-01-20 NOTE — Telephone Encounter (Signed)
Please obtain CXR 2 view And follow up appointment as soon as possible Tell patient to go to ER if symptoms worsen

## 2017-01-20 NOTE — Telephone Encounter (Signed)
Feeling SOB w/activity. Feels he can't get a full breath again and is concerned he is getting another pleural effusion. Please advise if we can send for CXR or if you want me to bring him in.

## 2017-01-21 ENCOUNTER — Ambulatory Visit
Admission: RE | Admit: 2017-01-21 | Discharge: 2017-01-21 | Disposition: A | Discharge status: Home / Self Care | Payer: BLUE CROSS/BLUE SHIELD | Source: Ambulatory Visit | Attending: Pulmonary Disease | Admitting: Pulmonary Disease

## 2017-01-21 ENCOUNTER — Ambulatory Visit (INDEPENDENT_AMBULATORY_CARE_PROVIDER_SITE_OTHER): Payer: BLUE CROSS/BLUE SHIELD | Admitting: Internal Medicine

## 2017-01-21 ENCOUNTER — Encounter: Payer: Self-pay | Admitting: Internal Medicine

## 2017-01-21 ENCOUNTER — Other Ambulatory Visit
Admission: RE | Admit: 2017-01-21 | Discharge: 2017-01-21 | Disposition: A | Discharge status: Home / Self Care | Payer: BLUE CROSS/BLUE SHIELD | Source: Ambulatory Visit | Attending: Internal Medicine | Admitting: Internal Medicine

## 2017-01-21 VITALS — BP 150/90 | HR 94 | Resp 16 | Ht 69.0 in | Wt 299.0 lb

## 2017-01-21 DIAGNOSIS — J90 Pleural effusion, not elsewhere classified: Secondary | ICD-10-CM

## 2017-01-21 LAB — SEDIMENTATION RATE: Sed Rate: 12 mm/hr (ref 0–20)

## 2017-01-21 LAB — C-REACTIVE PROTEIN: CRP: 2.6 mg/dL — AB (ref <1.0)

## 2017-01-21 NOTE — Patient Instructions (Signed)
Check ECHO, US liver, US thoracentesis Check CRP, ESR Check ANA and ANCA  

## 2017-01-21 NOTE — Discharge Instructions (Signed)
Thoracentesis, Care After °Refer to this sheet in the next few weeks. These instructions provide you with information about caring for yourself after your procedure. Your health care provider may also give you more specific instructions. Your treatment has been planned according to current medical practices, but problems sometimes occur. Call your health care provider if you have any problems or questions after your procedure. °What can I expect after the procedure? °After your procedure, it is common to have pain at the puncture site. °Follow these instructions at home: °· Take medicines only as directed by your health care provider. °· You may return to your normal diet and normal activities as directed by your health care provider. °· Drink enough fluid to keep your urine clear or pale yellow. °· Do not take baths, swim, or use a hot tub until your health care provider approves. °· Follow your health care provider's instructions about: °¨ Puncture site care. °¨ Bandage (dressing) changes and removal. °· Check your puncture site every day for signs of infection. Watch for: °¨ Redness, swelling, or pain. °¨ Fluid, blood, or pus. °· Keep all follow-up visits as directed by your health care provider. This is important. °Contact a health care provider if: °· You have redness, swelling, or pain at your puncture site. °· You have fluid, blood, or pus coming from your puncture site. °· You have a fever. °· You have chills. °· You have nausea or vomiting. °· You have trouble breathing. °· You develop a worsening cough. °Get help right away if: °· You have extreme shortness of breath. °· You develop chest pain. °· You faint or feel light-headed. °This information is not intended to replace advice given to you by your health care provider. Make sure you discuss any questions you have with your health care provider. °Document Released: 10/07/2014 Document Revised: 05/18/2016 Document Reviewed: 06/28/2014 °Elsevier  Interactive Patient Education © 2017 Elsevier Inc. ° °

## 2017-01-21 NOTE — Progress Notes (Signed)
PULMONARY OFFICE FOLLOW UP NOTE  Requesting MD/Service: Rosanna Randy Date of initial consultation: 11/09/15 Reason for consultation: Cough, dyspnea  PT PROFILE: 41 M with no prior pulmonary history referred for cough and dyspnea X 6 weeks. CXR revealed moderate R pleural effusion  DATA: Thoracentesis 11/12/16: 1950 mL of opaque amber pleural fluid was removed. Exudative chemistries (LDH 165, cholesterol 123). WBC 2560, 86% lymphs. Cytology: negative for malignancy, reactive meso cells, predominantly lymphocytes CT chest 11/12/16: post thoracentesis: Status post right thoracentesis with resolution of right pleural effusion. Minimal R basilar atelectasis is noted as well as some mild R lower lobe re-expansion edema. Scattered mediastinal lymph nodes as well as lymphadenopathy adjacent to the pericardium. These are significant by size criteria.  Office encounter 11/15/16: Symptoms much improved after thoracentesis. Pleural fluid analysis as noted above. Telephone encounter 11/25/16: Increasing shortness of breath and chest fullness Chest x-ray 11/26/16: Recurrence of right pleural effusion with minimal blunting of the left costophrenic angle Repeat thoracentesis 11/28/16: 1800 mL removed, LDH 165, total protein 4.9, white blood cell count 2801, 69% lymphocytes. Cytology negative, flow cytometry negative. Adenosine Deaminase 2.0 (not elevated).  Chest x-ray 12/20/16: Minimal blunting of bilateral costophrenic angles CXR 4/24 shows RT effusion   SUBJ:  Increased SOB today, cxr shows effusion +increased SOB and WOB  Denies CP, fever, purulent sputum, hemoptysis,  +LE edema    Vitals:   01/21/17 1042  BP: (!) 150/90  Pulse: 94  Resp: 16  SpO2: 91%  Weight: 299 lb (135.6 kg)  Height: '5\' 9"'$  (1.753 m)   Obese, NAD HEENT WNL No JVD or LAN Decreased Breath sounds RT base,full without adventitious sounds Reg, no M NABS symmetric pretibial edema  DATA:   BMP Latest Ref Rng & Units  10/29/2016 07/18/2016  Glucose 65 - 99 mg/dL 111(H) 137(H)  BUN 8 - 27 mg/dL 10 10  Creatinine 0.76 - 1.27 mg/dL 0.91 0.80  BUN/Creat Ratio 10 - '24 11 13  '$ Sodium 134 - 144 mmol/L 142 143  Potassium 3.5 - 5.2 mmol/L 4.3 4.2  Chloride 96 - 106 mmol/L 98 98  CO2 18 - 29 mmol/L 26 23  Calcium 8.6 - 10.2 mg/dL 9.5 8.9    CBC Latest Ref Rng & Units 10/29/2016 07/18/2016  WBC 3.4 - 10.8 x10E3/uL 8.1 6.3  Hematocrit 37.5 - 51.0 % 47.7 44.6  Platelets 150 - 379 x10E3/uL 319 246   Pleural fluid analysis results as above CXR: As above  IMPRESSION:   1)  Large lymphocyte predominant R pleural effusion.recurrent in nature  Etiology remains unclear.  2) chronic exertional dyspnea due to obesity   PLAN:  Plan for ECHO, US liver, US thoracentesis Check ESR, CRP Will need assessment by Dr Genevive Bi for VATS  Follow up Dr Alva Garnet  Patient/Family are satisfied with Plan of action and management. All questions answered  Corrin Parker, M.D.  [facility]  Medical Director [place] Director [facility] Cardio-Pulmonary Department

## 2017-01-22 ENCOUNTER — Ambulatory Visit
Admission: RE | Admit: 2017-01-22 | Discharge: 2017-01-22 | Disposition: A | Discharge status: Home / Self Care | Payer: BLUE CROSS/BLUE SHIELD | Source: Ambulatory Visit | Attending: Radiology | Admitting: Radiology

## 2017-01-22 ENCOUNTER — Ambulatory Visit
Admission: RE | Admit: 2017-01-22 | Discharge: 2017-01-22 | Disposition: A | Discharge status: Home / Self Care | Payer: BLUE CROSS/BLUE SHIELD | Source: Ambulatory Visit | Attending: Internal Medicine | Admitting: Internal Medicine

## 2017-01-22 DIAGNOSIS — J90 Pleural effusion, not elsewhere classified: Secondary | ICD-10-CM | POA: Insufficient documentation

## 2017-01-22 LAB — BODY FLUID CELL COUNT WITH DIFFERENTIAL
Eos, Fluid: 0 %
Lymphs, Fluid: 89 %
MONOCYTE-MACROPHAGE-SEROUS FLUID: 7 %
Neutrophil Count, Fluid: 4 %
Other Cells, Fluid: 0 %
Total Nucleated Cell Count, Fluid: 1156 cu mm

## 2017-01-22 LAB — LACTATE DEHYDROGENASE, PLEURAL OR PERITONEAL FLUID: LD FL: 129 U/L — AB (ref 3–23)

## 2017-01-22 LAB — ANCA TITERS
Atypical P-ANCA titer: <1:20 {titer}
C-ANCA: <1:20 {titer}
P-ANCA: <1:20 {titer}

## 2017-01-22 LAB — ANA W/REFLEX: Anti Nuclear Antibody(ANA): NEGATIVE

## 2017-01-22 LAB — GLUCOSE, PLEURAL OR PERITONEAL FLUID: Glucose, Fluid: 127 mg/dL

## 2017-01-22 NOTE — Procedures (Signed)
US guided right thoracentesis 1.45 L rose colored fluid obtained Sent for labs per ordering MD  Pt tolerated well. CXR pending

## 2017-01-24 ENCOUNTER — Ambulatory Visit
Admission: RE | Admit: 2017-01-24 | Discharge: 2017-01-24 | Disposition: A | Discharge status: Home / Self Care | Payer: BLUE CROSS/BLUE SHIELD | Source: Ambulatory Visit | Attending: Internal Medicine | Admitting: Internal Medicine

## 2017-01-24 ENCOUNTER — Ambulatory Visit (HOSPITAL_BASED_OUTPATIENT_CLINIC_OR_DEPARTMENT_OTHER)
Admission: RE | Admit: 2017-01-24 | Discharge: 2017-01-24 | Disposition: A | Discharge status: Home / Self Care | Payer: BLUE CROSS/BLUE SHIELD | Source: Ambulatory Visit | Attending: Internal Medicine | Admitting: Internal Medicine

## 2017-01-24 DIAGNOSIS — I119 Hypertensive heart disease without heart failure: Secondary | ICD-10-CM | POA: Diagnosis not present

## 2017-01-24 DIAGNOSIS — J90 Pleural effusion, not elsewhere classified: Secondary | ICD-10-CM | POA: Insufficient documentation

## 2017-01-24 DIAGNOSIS — R0689 Other abnormalities of breathing: Secondary | ICD-10-CM | POA: Insufficient documentation

## 2017-01-24 DIAGNOSIS — R188 Other ascites: Secondary | ICD-10-CM | POA: Diagnosis not present

## 2017-01-24 DIAGNOSIS — R938 Abnormal findings on diagnostic imaging of other specified body structures: Secondary | ICD-10-CM | POA: Diagnosis not present

## 2017-01-24 DIAGNOSIS — E119 Type 2 diabetes mellitus without complications: Secondary | ICD-10-CM | POA: Diagnosis not present

## 2017-01-24 DIAGNOSIS — R932 Abnormal findings on diagnostic imaging of liver and biliary tract: Secondary | ICD-10-CM | POA: Insufficient documentation

## 2017-01-24 LAB — CYTOLOGY - NON PAP

## 2017-01-24 NOTE — Progress Notes (Signed)
*  PRELIMINARY RESULTS* Echocardiogram 2D Echocardiogram has been performed.  Peter Ryan 01/24/2017, 10:13 AM

## 2017-01-26 LAB — BODY FLUID CULTURE: CULTURE: NO GROWTH

## 2017-01-28 ENCOUNTER — Telehealth: Payer: Self-pay | Admitting: *Deleted

## 2017-01-28 ENCOUNTER — Other Ambulatory Visit: Payer: Self-pay | Admitting: *Deleted

## 2017-01-28 DIAGNOSIS — K7469 Other cirrhosis of liver: Secondary | ICD-10-CM

## 2017-01-28 DIAGNOSIS — R188 Other ascites: Secondary | ICD-10-CM

## 2017-01-28 NOTE — Telephone Encounter (Signed)
Pt called by Dr Alva Garnet

## 2017-01-28 NOTE — Telephone Encounter (Signed)
Pt is scheduled to see Dr. Vicente Males with GI tomorrow morning at [place] is calling pt to infomr him of that appt. Please advise of anything further.

## 2017-01-28 NOTE — Telephone Encounter (Signed)
Pt has called and left me a message stating that he is having SOB and his abdomen feels full and his message states he wants to know if he needs a paracentesis. States he wasn't sure who else to call. Suanne Marker has LM for GI to see how soon we can get him in. Please advise.

## 2017-01-29 ENCOUNTER — Ambulatory Visit (INDEPENDENT_AMBULATORY_CARE_PROVIDER_SITE_OTHER): Payer: BLUE CROSS/BLUE SHIELD | Admitting: Gastroenterology

## 2017-01-29 ENCOUNTER — Other Ambulatory Visit
Admission: RE | Admit: 2017-01-29 | Discharge: 2017-01-29 | Disposition: A | Discharge status: Home / Self Care | Payer: BLUE CROSS/BLUE SHIELD | Source: Ambulatory Visit | Attending: Gastroenterology | Admitting: Gastroenterology

## 2017-01-29 ENCOUNTER — Telehealth: Payer: Self-pay

## 2017-01-29 ENCOUNTER — Other Ambulatory Visit: Payer: Self-pay

## 2017-01-29 ENCOUNTER — Ambulatory Visit: Payer: BLUE CROSS/BLUE SHIELD | Admitting: Cardiothoracic Surgery

## 2017-01-29 ENCOUNTER — Encounter: Payer: Self-pay | Admitting: Gastroenterology

## 2017-01-29 VITALS — BP 158/80 | HR 80 | Temp 97.6°F | Ht 70.0 in | Wt 295.2 lb

## 2017-01-29 DIAGNOSIS — R188 Other ascites: Secondary | ICD-10-CM | POA: Diagnosis present

## 2017-01-29 DIAGNOSIS — E669 Obesity, unspecified: Secondary | ICD-10-CM | POA: Diagnosis not present

## 2017-01-29 DIAGNOSIS — Z6841 Body Mass Index (BMI) 40.0 and over, adult: Secondary | ICD-10-CM

## 2017-01-29 DIAGNOSIS — K7460 Unspecified cirrhosis of liver: Secondary | ICD-10-CM

## 2017-01-29 DIAGNOSIS — IMO0001 Reserved for inherently not codable concepts without codable children: Secondary | ICD-10-CM

## 2017-01-29 LAB — PROTIME-INR
INR: 0.93
Prothrombin Time: 12.5 seconds (ref 11.4–15.2)

## 2017-01-29 NOTE — Telephone Encounter (Signed)
Pt scheduled for US paracentesis at [facility] on Tuesday, May 8th @ 1:00pm. Left vm letting pt of this appt.

## 2017-01-29 NOTE — Progress Notes (Signed)
Gastroenterology Consultation  Referring Provider:     Dr Mortimer Fries Primary Care Physician:  Wilhemena Durie, MD Primary Gastroenterologist:  Dr. Jonathon Bellows  Reason for Consultation:     Cirrhosis of the liver and ascites         HPI:   Peter Ryan is a 66 y.o. y/o male referred for consultation & management  by Dr. Wilhemena Durie, MD.   He has been referred for cirrhosis of the liver with ascites. Review of his records suggest he sees Dr Mortimer Fries for a pleural effusion ( on going since march 2018 )which was found to be an exudate- etiology seems to be unclear and has been recurrent. He also did have some mediastinal lymphadenopathy and is being evaluated by [facility].    USG abdomen 01/24/17 shows sludge in gall bladder, parencyma of the liver suggesting cirrhosis, moderate ascites.   LFT's in 09/2016 were normal with albumin 4.2, CBC normal with platelet count of 319. No new labs since.    Never knew he had liver issues in the past . Never consumed alcohol in excess, no family history of liver disease, Mostly gained weight over the years, recent weight at 315 lbs is his heaviest. Noticed belly getting more distended as he developed shortness of breath. His parents were over weight too. Has HTN and diabetes , has not been diagnosed with sleep apnea, denies having elevated lipids. No military service , no blood transfusion, no tatoos. Denies any illegal drug use   Consumes a lot of processed foods, frozen meals, sauces, no soups. Consumes fast foods 1-2 times a week .    Past Medical History:  Diagnosis Date  . Arthritis   . BPH (benign prostatic hyperplasia)   . Chronic prostatitis   . Diabetes mellitus without complication ([place])   . Dyslipidemia   . Elevated PSA   . Gastric reflux   . Gout   . HTN (hypertension)   . Over weight   . Psoriasis   . Testicular pain, right   . Urinary frequency     Past Surgical History:  Procedure Laterality Date  . hydrocelectomy    .  PILONIDAL CYST EXCISION      Prior to Admission medications   Medication Sig Start Date End Date Taking? Authorizing Provider  allopurinol (ZYLOPRIM) 300 MG tablet Take 1 tablet (300 mg total) by mouth daily. 06/26/16  Yes Richard Maceo Pro., MD  amLODipine (NORVASC) 10 MG tablet TAKE ONE TABLET EVERY DAY 12/23/16  Yes [name]., MD  aspirin EC 81 MG tablet Take 81 mg by mouth daily.    Yes Historical Provider, MD  Cholecalciferol (VITAMIN D3) 1000 UNITS CAPS Take 1,000 Units by mouth daily.    Yes Historical Provider, MD  finasteride (PROSCAR) 5 MG tablet Take 1 tablet (5 mg total) by mouth daily. 07/18/16  Yes Shannon A McGowan, PA-C  losartan (COZAAR) 50 MG tablet Take 1 tablet (50 mg total) by mouth daily. 12/02/16  Yes Richard Maceo Pro., MD    Family History  Problem Relation Age of Onset  . Uterine cancer Mother   . Diabetes Mother   . Bladder Cancer Neg Hx   . Kidney disease Neg Hx   . Prostate cancer Neg Hx      Social History  Substance Use Topics  . Smoking status: Former Smoker    Packs/day: 1.50    Years: 8.00    Types: Cigarettes  . Smokeless tobacco: Never  Used     Comment: quit 40 years ago  . Alcohol use Not on file     Comment: rarely    Allergies as of 01/29/2017 - Review Complete 01/29/2017  Allergen Reaction Noted  . Metformin Other (See Comments) 05/23/2015    Review of Systems:    All systems reviewed and negative except where noted in HPI.   Physical Exam:  BP (!) 158/80   Pulse 80   Temp 97.6 F (36.4 C) (Oral)   Ht 5\' 10"  (1.778 m)   Wt 295 lb 3.2 oz (133.9 kg)   BMI 42.36 kg/m  No LMP for male patient. Psych:  Alert and cooperative. Normal mood and affect. General:   Alert,  Well-developed, well-nourished, pleasant and cooperative in NAD Head:  Normocephalic and atraumatic. Eyes:  Sclera clear, no icterus.   Conjunctiva pink. Ears:  Normal auditory acuity. Nose:  No deformity, discharge, or lesions. Mouth:  No  deformity or lesions,oropharynx pink & moist. Neck:  Supple; no masses or thyromegaly. Lungs:  Respirations even and unlabored.  Clear throughout to auscultation.   No wheezes, crackles, or rhonchi. No acute distress. Heart:  Regular rate and rhythm; no murmurs, clicks, rubs, or gallops. Abdomen:  Normal bowel sounds.  No bruits.  Soft, non-tender and distended , free fluid present, without masses, hepatosplenomegaly or hernias noted.  No guarding or rebound tenderness.    Pulses:  Normal pulses noted. Extremities:  No clubbing or edema.  No cyanosis. Neurologic:  Alert and oriented x3;  grossly normal neurologically. Skin:  Intact without significant lesions or rashes. No jaundice. Psych:  Alert and cooperative. Normal mood and affect.  Imaging Studies: Dg Chest 1 View  Result Date: 01/22/2017 CLINICAL DATA:  Status post right thoracentesis. EXAM: CHEST 1 VIEW COMPARISON:  Radiographs of January 21, 2017. FINDINGS: Stable cardiomediastinal silhouette. No pneumothorax is noted. Minimal bilateral pleural effusions are noted with adjacent subsegmental atelectasis. Bony thorax is unremarkable. IMPRESSION: Minimal bilateral pleural effusions with adjacent subsegmental atelectasis. No pneumothorax is noted. Electronically Signed   By: Marijo Conception, M.D.   On: 01/22/2017 16:29   Dg Chest 2 View  Result Date: 01/21/2017 CLINICAL DATA:  Follow-up pleural effusion EXAM: CHEST  2 VIEW COMPARISON:  12/20/2016 FINDINGS: Cardiac shadow remains enlarged. Minimal blunting of the costophrenic angles is again identified bilaterally. No focal infiltrate is seen. No acute bony abnormality is noted. IMPRESSION: Minimal small pleural effusions stable from the previous exam. Electronically Signed   By: Inez Catalina M.D.   On: 01/21/2017 10:29   US Abdomen Complete  Result Date: 01/24/2017 CLINICAL DATA:  Ascites. EXAM: ABDOMEN ULTRASOUND COMPLETE COMPARISON:  CT 11/12/2016. FINDINGS: Gallbladder: Sludge in the  gallbladder. Gallbladder wall is slightly thickened at 3.2 mm. This can be from hypoproteinemia. Cholecystitis cannot be excluded. Negative Murphy sign. Common bile duct: Diameter: 3.3 mm Liver: Liver has a heterogeneous nodular parenchymal pattern suggesting cirrhosis. No focal hepatic abnormality identified. IVC: No abnormality visualized. Pancreas: Visualized portion unremarkable. Spleen: Size and appearance within normal limits. Right Kidney: Length: 13.2 cm. Echogenicity within normal limits. No mass or hydronephrosis visualized. Left Kidney: Length: 12.2 cm. Echogenicity within normal limits. 4.6 cm simple cyst. Hydronephrosis visualized. Abdominal aorta: No aneurysm visualized. Other findings: Moderate ascites. IMPRESSION: 1. Sludge in the gallbladder. Gallbladder wall is slightly thickened at 3.2 mm. This may be from hypoproteinemia. Cholecystitis cannot be completely excluded. Negative Murphy sign. No biliary distention. 2. Liver has a heterogeneous nodular parenchymal pattern suggesting cirrhosis. No  focal hepatic abnormality identified. 3. Moderate ascites. Electronically Signed   By: Marcello Moores  Register   On: 01/24/2017 09:20   US Thoracentesis Asp Pleural Space W/img Guide  Result Date: 01/22/2017 INDICATION: Symptomatic right sided pleural effusion EXAM: US THORACENTESIS ASP PLEURAL SPACE W/IMG GUIDE COMPARISON:  Previous thoracentesis MEDICATIONS: 10 cc 1% lidocaine. COMPLICATIONS: None immediate. TECHNIQUE: Informed written consent was obtained from the patient after a discussion of the risks, benefits and alternatives to treatment. A timeout was performed prior to the initiation of the procedure. Initial ultrasound scanning demonstrates a right pleural effusion. The right lower chest was prepped and draped in the usual sterile fashion. 1% lidocaine was used for local anesthesia. Under direct ultrasound guidance, a 19 gauge, 7-cm, Yueh catheter was introduced. An ultrasound image was saved for  documentation purposes. The thoracentesis was performed. The catheter was removed and a dressing was applied. The patient tolerated the procedure well without immediate post procedural complication. The patient was escorted to have an upright chest radiograph. FINDINGS: A total of approximately 1.45 liters of rose fluid was removed. Requested samples were sent to the laboratory. IMPRESSION: Successful ultrasound-guided right sided thoracentesis yielding 1.45 liters of pleural fluid. Read by Lavonia Drafts [facility] Electronically Signed   By: Jerilynn Mages.  Shick M.D.   On: 01/22/2017 16:21    Assessment and Plan:   Moussa Wiegand is a 66 y.o. y/o male has been referred for cirrhosis of the liver with ascites. I do not have any recent liver function but those from 09/2016 show good synthetic function and no obvious features to suggest portal hypertension. I do note that the USG shows features of liver cirrhosis and ascites.   If he truly does have ascites from portal hypertension then it could also explain the pleural effusions which can occur at times with hepatic hydrothorax although it should be a transudate .   I have counseled him extensively for over 30 mins on weight loss, stop consuming sodas, fast food and processed food. I strongly advised him to consume food purchased from the fresh fruit, veggies section and prepared at home with no butter.   Plan  1. Diagnostic and therapeutic paracentesis with cell count, protein, albumin, culture and gram stain.   2. Labs to screen for viral , autoimmune hepatitis, other causes for chronic liver disease   3. Low salt diet , daily weights   4. Doppler usg of the abdomen to rule thrombosis of any vasculature  5. If the ascites is a transudate then will commence him on diuretics.   6. EGD to screen for esophageal varices once his ascites is drained    F/u in 2-4 weeks.    Dr Jonathon Bellows MD

## 2017-01-29 NOTE — Telephone Encounter (Signed)
Pt scheduled for US Doppler abdomen at [facility] on Friday, May 4th @ 10:00am. Pt has been instructed to arrive at 9:45am at the [facility] mall registration desk and to be NPO 6 to 8 hours prior.

## 2017-01-30 LAB — ANTI-SMOOTH MUSCLE ANTIBODY, IGG: F-Actin IgG: 12 Units (ref 0–19)

## 2017-01-31 ENCOUNTER — Ambulatory Visit
Admission: RE | Admit: 2017-01-31 | Discharge: 2017-01-31 | Disposition: A | Discharge status: Home / Self Care | Payer: BLUE CROSS/BLUE SHIELD | Source: Ambulatory Visit | Attending: Gastroenterology | Admitting: Gastroenterology

## 2017-01-31 DIAGNOSIS — R188 Other ascites: Secondary | ICD-10-CM

## 2017-01-31 DIAGNOSIS — K7460 Unspecified cirrhosis of liver: Secondary | ICD-10-CM | POA: Diagnosis present

## 2017-01-31 LAB — MISC LABCORP TEST (SEND OUT): LABCORP TEST CODE: 164880

## 2017-02-03 ENCOUNTER — Telehealth: Payer: Self-pay

## 2017-02-03 ENCOUNTER — Ambulatory Visit: Payer: BLUE CROSS/BLUE SHIELD | Admitting: Family Medicine

## 2017-02-03 ENCOUNTER — Other Ambulatory Visit: Payer: Self-pay

## 2017-02-03 NOTE — Telephone Encounter (Signed)
-----   Message from Jonathon Bellows, MD sent at 02/02/2017  9:22 PM EDT ----- No vascular thrombosis on ultrasound

## 2017-02-03 NOTE — Telephone Encounter (Signed)
Advised pt of results per Dr. Vicente Males.   No vascular thrombosis on ultrasound

## 2017-02-04 ENCOUNTER — Ambulatory Visit
Admission: RE | Admit: 2017-02-04 | Discharge: 2017-02-04 | Disposition: A | Discharge status: Home / Self Care | Payer: BLUE CROSS/BLUE SHIELD | Source: Ambulatory Visit | Attending: Gastroenterology | Admitting: Gastroenterology

## 2017-02-04 ENCOUNTER — Telehealth: Payer: Self-pay | Admitting: Pulmonary Disease

## 2017-02-04 DIAGNOSIS — R188 Other ascites: Secondary | ICD-10-CM | POA: Insufficient documentation

## 2017-02-04 DIAGNOSIS — K7460 Unspecified cirrhosis of liver: Secondary | ICD-10-CM | POA: Diagnosis not present

## 2017-02-04 LAB — BODY FLUID CELL COUNT WITH DIFFERENTIAL
Eos, Fluid: 0 %
Lymphs, Fluid: 61 %
MONOCYTE-MACROPHAGE-SEROUS FLUID: 12 %
NEUTROPHIL FLUID: 27 %
Other Cells, Fluid: 0 %
WBC FLUID: 2555 uL

## 2017-02-04 LAB — ALBUMIN, PLEURAL OR PERITONEAL FLUID: ALBUMIN FL: 3.1 g/dL

## 2017-02-04 LAB — PATHOLOGIST SMEAR REVIEW

## 2017-02-04 LAB — PROTEIN, PLEURAL OR PERITONEAL FLUID: TOTAL PROTEIN, FLUID: 4.8 g/dL

## 2017-02-04 MED ORDER — ALBUMIN HUMAN 25 % IV SOLN
25.0000 g | Freq: Once | INTRAVENOUS | Status: AC
  Administered 2017-02-04: 25 g via INTRAVENOUS
  Filled 2017-02-04: qty 100

## 2017-02-04 NOTE — Procedures (Signed)
US paracentesis EBL 0 Comp 0 

## 2017-02-04 NOTE — Telephone Encounter (Signed)
Patient wife came to office with fmla forms to be completed she says Dr. Georgeann Oppenheim office told her to bring them here.  Patient wife given roi for patient to sign and mailing address to return to [employer] with $25 fee.

## 2017-02-07 ENCOUNTER — Encounter: Payer: Self-pay | Admitting: *Deleted

## 2017-02-07 ENCOUNTER — Telehealth: Payer: Self-pay

## 2017-02-07 ENCOUNTER — Other Ambulatory Visit: Payer: Self-pay

## 2017-02-07 ENCOUNTER — Inpatient Hospital Stay
Admission: EM | Admit: 2017-02-07 | Discharge: 2017-02-08 | DRG: 372 | Disposition: A | Discharge status: Home / Self Care | Payer: BLUE CROSS/BLUE SHIELD | Attending: Internal Medicine | Admitting: Internal Medicine

## 2017-02-07 DIAGNOSIS — E119 Type 2 diabetes mellitus without complications: Secondary | ICD-10-CM | POA: Diagnosis present

## 2017-02-07 DIAGNOSIS — K7460 Unspecified cirrhosis of liver: Secondary | ICD-10-CM | POA: Diagnosis present

## 2017-02-07 DIAGNOSIS — L409 Psoriasis, unspecified: Secondary | ICD-10-CM | POA: Diagnosis present

## 2017-02-07 DIAGNOSIS — Z87891 Personal history of nicotine dependence: Secondary | ICD-10-CM

## 2017-02-07 DIAGNOSIS — Z888 Allergy status to other drugs, medicaments and biological substances status: Secondary | ICD-10-CM

## 2017-02-07 DIAGNOSIS — I10 Essential (primary) hypertension: Secondary | ICD-10-CM | POA: Diagnosis present

## 2017-02-07 DIAGNOSIS — E785 Hyperlipidemia, unspecified: Secondary | ICD-10-CM | POA: Diagnosis present

## 2017-02-07 DIAGNOSIS — M109 Gout, unspecified: Secondary | ICD-10-CM | POA: Diagnosis present

## 2017-02-07 DIAGNOSIS — R188 Other ascites: Secondary | ICD-10-CM | POA: Diagnosis present

## 2017-02-07 DIAGNOSIS — K652 Spontaneous bacterial peritonitis: Principal | ICD-10-CM | POA: Diagnosis present

## 2017-02-07 DIAGNOSIS — K659 Peritonitis, unspecified: Secondary | ICD-10-CM

## 2017-02-07 DIAGNOSIS — M1990 Unspecified osteoarthritis, unspecified site: Secondary | ICD-10-CM | POA: Diagnosis present

## 2017-02-07 DIAGNOSIS — Z6841 Body Mass Index (BMI) 40.0 and over, adult: Secondary | ICD-10-CM

## 2017-02-07 DIAGNOSIS — N400 Enlarged prostate without lower urinary tract symptoms: Secondary | ICD-10-CM | POA: Diagnosis present

## 2017-02-07 DIAGNOSIS — Z7982 Long term (current) use of aspirin: Secondary | ICD-10-CM

## 2017-02-07 DIAGNOSIS — J918 Pleural effusion in other conditions classified elsewhere: Secondary | ICD-10-CM | POA: Diagnosis present

## 2017-02-07 DIAGNOSIS — E669 Obesity, unspecified: Secondary | ICD-10-CM | POA: Diagnosis present

## 2017-02-07 DIAGNOSIS — Z79899 Other long term (current) drug therapy: Secondary | ICD-10-CM | POA: Diagnosis not present

## 2017-02-07 DIAGNOSIS — K219 Gastro-esophageal reflux disease without esophagitis: Secondary | ICD-10-CM | POA: Diagnosis present

## 2017-02-07 HISTORY — DX: Unspecified cirrhosis of liver: K74.60

## 2017-02-07 LAB — COMPREHENSIVE METABOLIC PANEL
ALT: 15 U/L — ABNORMAL LOW (ref 17–63)
AST: 17 U/L (ref 15–41)
Albumin: 4 g/dL (ref 3.5–5.0)
Alkaline Phosphatase: 58 U/L (ref 38–126)
Anion gap: 9 (ref 5–15)
BUN: 15 mg/dL (ref 6–20)
CO2: 28 mmol/L (ref 22–32)
Calcium: 9 mg/dL (ref 8.9–10.3)
Chloride: 102 mmol/L (ref 101–111)
Creatinine, Ser: 0.94 mg/dL (ref 0.61–1.24)
GFR calc Af Amer: >60 mL/min (ref >60)
GFR calc non Af Amer: >60 mL/min (ref >60)
Glucose, Bld: 105 mg/dL — ABNORMAL HIGH (ref 65–99)
Potassium: 4.2 mmol/L (ref 3.5–5.1)
Sodium: 139 mmol/L (ref 135–145)
Total Bilirubin: 0.7 mg/dL (ref 0.3–1.2)
Total Protein: 7.4 g/dL (ref 6.5–8.1)

## 2017-02-07 LAB — GLUCOSE, CAPILLARY
GLUCOSE-CAPILLARY: 105 mg/dL — AB (ref 65–99)
Glucose-Capillary: 85 mg/dL (ref 65–99)

## 2017-02-07 LAB — BODY FLUID CULTURE: CULTURE: NO GROWTH

## 2017-02-07 LAB — TSH: TSH: 1.3 u[IU]/mL (ref 0.350–4.500)

## 2017-02-07 LAB — CBC
HEMATOCRIT: 47.9 % (ref 40.0–52.0)
Hemoglobin: 16.3 g/dL (ref 13.0–18.0)
MCH: 28.1 pg (ref 26.0–34.0)
MCHC: 34 g/dL (ref 32.0–36.0)
MCV: 82.6 fL (ref 80.0–100.0)
Platelets: 283 10*3/uL (ref 150–440)
RBC: 5.8 MIL/uL (ref 4.40–5.90)
RDW: 14.4 % (ref 11.5–14.5)
WBC: 9 10*3/uL (ref 3.8–10.6)

## 2017-02-07 LAB — LACTATE DEHYDROGENASE, PLEURAL OR PERITONEAL FLUID: LD, Fluid: 102 U/L — ABNORMAL HIGH (ref 3–23)

## 2017-02-07 LAB — TRIGLYCERIDES: Triglycerides: 297 mg/dL — ABNORMAL HIGH (ref <150)

## 2017-02-07 MED ORDER — ALLOPURINOL 100 MG PO TABS
300.0000 mg | ORAL_TABLET | Freq: Every day | ORAL | Status: DC
Start: 2017-02-08 — End: 2017-02-08
  Administered 2017-02-08: 08:00:00 300 mg via ORAL
  Filled 2017-02-07: qty 3

## 2017-02-07 MED ORDER — SODIUM CHLORIDE 0.9 % IV SOLN: 250.0000 mL | INTRAVENOUS | Status: DC | PRN

## 2017-02-07 MED ORDER — SODIUM CHLORIDE 0.9% FLUSH
3.0000 mL | Freq: Two times a day (BID) | INTRAVENOUS | Status: DC
  Administered 2017-02-07 – 2017-02-08 (×2): 3 mL via INTRAVENOUS

## 2017-02-07 MED ORDER — LOSARTAN POTASSIUM 50 MG PO TABS
50.0000 mg | ORAL_TABLET | Freq: Every day | ORAL | Status: DC
Start: 2017-02-08 — End: 2017-02-08
  Administered 2017-02-08: 08:00:00 50 mg via ORAL
  Filled 2017-02-07: qty 1

## 2017-02-07 MED ORDER — HYDROCODONE-ACETAMINOPHEN 5-325 MG PO TABS: 1.0000 | ORAL_TABLET | ORAL | Status: DC | PRN

## 2017-02-07 MED ORDER — ASPIRIN EC 81 MG PO TBEC
81.0000 mg | DELAYED_RELEASE_TABLET | Freq: Every day | ORAL | Status: DC
  Administered 2017-02-08: 81 mg via ORAL
  Filled 2017-02-07: qty 1

## 2017-02-07 MED ORDER — ONDANSETRON HCL 4 MG/2ML IJ SOLN: 4.0000 mg | Freq: Four times a day (QID) | INTRAMUSCULAR | Status: DC | PRN

## 2017-02-07 MED ORDER — ACETAMINOPHEN 650 MG RE SUPP: 650.0000 mg | Freq: Four times a day (QID) | RECTAL | Status: DC | PRN

## 2017-02-07 MED ORDER — AMLODIPINE BESYLATE 10 MG PO TABS
10.0000 mg | ORAL_TABLET | Freq: Every day | ORAL | Status: DC
  Administered 2017-02-08: 10 mg via ORAL
  Filled 2017-02-07: qty 1

## 2017-02-07 MED ORDER — DEXTROSE 5 % IV SOLN
2.0000 g | INTRAVENOUS | Status: DC
  Filled 2017-02-07: qty 2

## 2017-02-07 MED ORDER — VITAMIN D 1000 UNITS PO TABS
1000.0000 [IU] | ORAL_TABLET | Freq: Every day | ORAL | Status: DC
  Administered 2017-02-08: 1000 [IU] via ORAL
  Filled 2017-02-07: qty 1

## 2017-02-07 MED ORDER — SODIUM CHLORIDE 0.9% FLUSH: 3.0000 mL | INTRAVENOUS | Status: DC | PRN

## 2017-02-07 MED ORDER — FINASTERIDE 5 MG PO TABS
5.0000 mg | ORAL_TABLET | Freq: Every day | ORAL | Status: DC
  Administered 2017-02-08: 08:00:00 5 mg via ORAL
  Filled 2017-02-07: qty 1

## 2017-02-07 MED ORDER — ENOXAPARIN SODIUM 40 MG/0.4ML ~~LOC~~ SOLN
40.0000 mg | SUBCUTANEOUS | Status: DC
  Filled 2017-02-07: qty 0.4

## 2017-02-07 MED ORDER — ONDANSETRON HCL 4 MG PO TABS: 4.0000 mg | ORAL_TABLET | Freq: Four times a day (QID) | ORAL | Status: DC | PRN

## 2017-02-07 MED ORDER — DEXTROSE 5 % IV SOLN
2.0000 g | Freq: Every day | INTRAVENOUS | Status: DC
  Administered 2017-02-07: 2 g via INTRAVENOUS
  Filled 2017-02-07: qty 2

## 2017-02-07 MED ORDER — INSULIN ASPART 100 UNIT/ML ~~LOC~~ SOLN: 0.0000 [IU] | Freq: Three times a day (TID) | SUBCUTANEOUS | Status: DC

## 2017-02-07 MED ORDER — ACETAMINOPHEN 325 MG PO TABS: 650.0000 mg | ORAL_TABLET | Freq: Four times a day (QID) | ORAL | Status: DC | PRN

## 2017-02-07 NOTE — H&P (Signed)
[place] [facility] [place] NAME: Peter Ryan    MR#:  [id number]  DATE OF BIRTH:  March 18, 1951  DATE OF ADMISSION:  02/07/2017  PRIMARY CARE PHYSICIAN: [name]., MD   REQUESTING/REFERRING PHYSICIAN: Lavonia Drafts MD  CHIEF COMPLAINT:   Chief Complaint  Patient presents with  . Abnormal Lab    HISTORY OF PRESENT ILLNESS: Peter Ryan  is a 66 y.o. male with a known history of  BPH , DM2, dyslipdemia, gerd, and gout, recent abdominal distentionAnd a CT suggestive of liver cirrhosis of unclear etiology. Patient was seen by GI and ultrasound of the abdomen as well as portal vein thrombosis was ruled out. Patient did not have any thrombocytopenia or other liver dysfunction. He was seen by Dr. Wilhemena Durie and had paracentesis recently had 5L removed the analysis showed elevated WBC count and the ascitic fluid. Cultures are negative however GI wanted him admitted for IV antibiotics. He was seen by Dr. Wilhemena Durie in the ER. Patient reports that he did not have abdominal pain prior to fluid drawn he felt like his abdomen was full.    PAST MEDICAL HISTORY:   Past Medical History:  Diagnosis Date  . Arthritis   . BPH (benign prostatic hyperplasia)   . Chronic prostatitis   . Cirrhosis of liver ([place])   . Diabetes mellitus without complication ([place])   . Dyslipidemia   . Elevated PSA   . Gastric reflux   . Gout   . HTN (hypertension)   . Over weight   . Psoriasis   . Testicular pain, right   . Urinary frequency     PAST SURGICAL HISTORY: Past Surgical History:  Procedure Laterality Date  . hydrocelectomy    . PILONIDAL CYST EXCISION      SOCIAL HISTORY:  Social History  Substance Use Topics  . Smoking status: Former Smoker    Packs/day: 1.50    Years: 8.00    Types: Cigarettes  . Smokeless tobacco: Never Used     Comment: quit 40 years ago  . Alcohol use Not on file     Comment: rarely    FAMILY HISTORY:  Family History  Problem  Relation Age of Onset  . Uterine cancer Mother   . Diabetes Mother   . Bladder Cancer Neg Hx   . Kidney disease Neg Hx   . Prostate cancer Neg Hx     DRUG ALLERGIES:  Allergies  Allergen Reactions  . Metformin Other (See Comments)    Stomach upset     REVIEW OF SYSTEMS:   CONSTITUTIONAL: No fever, fatigue or weakness.  EYES: No blurred or double vision.  EARS, NOSE, AND THROAT: No tinnitus or ear pain.  RESPIRATORY: No cough, shortness of breath, wheezing or hemoptysis.  CARDIOVASCULAR: No chest pain, orthopnea, edema.  GASTROINTESTINAL: No nausea, vomiting, diarrhea or abdominal pain.  GENITOURINARY: No dysuria, hematuria.  ENDOCRINE: No polyuria, nocturia,  HEMATOLOGY: No anemia, easy bruising or bleeding SKIN: No rash or lesion. MUSCULOSKELETAL: No joint pain or arthritis.   NEUROLOGIC: No tingling, numbness, weakness.  PSYCHIATRY: No anxiety or depression.   MEDICATIONS AT HOME:  Prior to Admission medications   Medication Sig Start Date End Date Taking? Authorizing Provider  allopurinol (ZYLOPRIM) 300 MG tablet Take 1 tablet (300 mg total) by mouth daily. 06/26/16  Yes [name]., MD  amLODipine (NORVASC) 10 MG tablet TAKE ONE TABLET EVERY DAY 12/23/16  Yes [name]., MD  aspirin EC 81 MG tablet Take 81 mg by mouth daily.    Yes [provider]  Cholecalciferol (VITAMIN D3) 1000 UNITS CAPS Take 1,000 Units by mouth daily.    Yes [provider]  finasteride (PROSCAR) 5 MG tablet Take 1 tablet (5 mg total) by mouth daily. 07/18/16  Yes McGowan, Larene [place] A, PA-C  losartan (COZAAR) 50 MG tablet Take 1 tablet (50 mg total) by mouth daily. 12/02/16  Yes [name]., MD      PHYSICAL EXAMINATION:   VITAL SIGNS: Blood pressure (!) 159/87, pulse 79, temperature 98.4 F (36.9 C), temperature source Oral, resp. rate 18, height 5\' 10"  (1.778 m), weight 295 lb (133.8 kg), SpO2 97 %.  GENERAL:  66 y.o.-year-old patient lying in  the bed with no acute distress.  EYES: Pupils equal, round, reactive to light and accommodation. No scleral icterus. Extraocular muscles intact.  HEENT: Head atraumatic, normocephalic. Oropharynx and nasopharynx clear.  NECK:  Supple, no jugular venous distention. No thyroid enlargement, no tenderness.  LUNGS: Normal breath sounds bilaterally, no wheezing, rales,rhonchi or crepitation. No use of accessory muscles of respiration.  CARDIOVASCULAR: S1, S2 normal. No murmurs, rubs, or gallops.  ABDOMEN: Soft, nontender, Mild distended. Bowel sounds present. No organomegaly or mass.  EXTREMITIES: No pedal edema, cyanosis, or clubbing.  NEUROLOGIC: Cranial nerves II through XII are intact. Muscle strength 5/5 in all extremities. Sensation intact. Gait not checked.  PSYCHIATRIC: The patient is alert and oriented x 3.  SKIN: No obvious rash, lesion, or ulcer.   LABORATORY PANEL:   CBC  Recent Labs Lab 02/07/17 0951  WBC 9.0  HGB 16.3  HCT 47.9  PLT 283  MCV 82.6  MCH 28.1  MCHC 34.0  RDW 14.4   ------------------------------------------------------------------------------------------------------------------  Chemistries   Recent Labs Lab 02/07/17 0951  NA 139  K 4.2  CL 102  CO2 28  GLUCOSE 105*  BUN 15  CREATININE 0.94  CALCIUM 9.0  AST 17  ALT 15*  ALKPHOS 58  BILITOT 0.7   ------------------------------------------------------------------------------------------------------------------ estimated creatinine clearance is 110.7 mL/min (by C-G formula based on SCr of 0.94 mg/dL). ------------------------------------------------------------------------------------------------------------------ No results for input(s): TSH, T4TOTAL, T3FREE, THYROIDAB in the last 72 hours.  Invalid input(s): FREET3   Coagulation profile No results for input(s): INR, PROTIME in the last 168  hours. ------------------------------------------------------------------------------------------------------------------- No results for input(s): DDIMER in the last 72 hours. -------------------------------------------------------------------------------------------------------------------  Cardiac Enzymes No results for input(s): CKMB, TROPONINI, MYOGLOBIN in the last 168 hours.  Invalid input(s): CK ------------------------------------------------------------------------------------------------------------------ Invalid input(s): POCBNP  ---------------------------------------------------------------------------------------------------------------  Urinalysis    Component Value Date/Time   APPEARANCEUR Clear 07/18/2016 0927   GLUCOSEU Negative 07/18/2016 0927   BILIRUBINUR Negative 07/18/2016 0927   PROTEINUR 1+ (A) 07/18/2016 0927   UROBILINOGEN 0.2 06/14/2016 1419   NITRITE Negative 07/18/2016 0927   LEUKOCYTESUR Negative 07/18/2016 0927     RADIOLOGY: No results found.  EKG: Orders placed or performed in visit on 10/28/16  . EKG 12-Lead    IMPRESSION AND PLAN: Patient is a 66 year old white male there is concern for SBP  1. SBP- will treat with iv ceftriaxone follow cx  2. Possible liver cirrhosis further w/u per GI  3. DM2- I will place him on sliding scale, follow blood sugars  4. Essential hypertension continue therapy with amlodipine and Cozaar  5. History of gout continue allopurinol  6. BPH continue finasteride     All the records are reviewed and case discussed with ED provider. Management plans discussed  with the patient, family and they are in agreement.  CODE STATUS: Code Status History    This patient does not have a recorded code status. Please follow your organizational policy for patients in this situation.       TOTAL TIME TAKING CARE OF THIS PATIENT: 55 minutes.    Dustin Flock M.D on 02/07/2017 at 1:40 PM  Between 7am to  6pm - Pager - [phone]  After 6pm go to [email or web address] - password [email or web address] [facility]  [facility]  Office  [phone]  CC: Primary care physician; [name]., MD

## 2017-02-07 NOTE — ED Provider Notes (Signed)
[facility] Emergency Department Provider Note   ____________________________________________    I have reviewed the triage vital signs and the nursing notes.   HISTORY  Chief Complaint Abnormal Lab     HPI Peter Ryan is a 66 y.o. male who presents to the ED because he was sent by his gastroenterologist. Patient had paracentesis 3 days ago and lab test demonstrated increased white blood cells and Dr. Vicente Males recommended that he come to the emergency department to be treated for spontaneous bacterial peritonitis. The patient denies fevers or chills. He denies abdominal pain. No nausea or vomiting. No diarrhea. Overall he has no complaints.   Past Medical History:  Diagnosis Date  . Arthritis   . BPH (benign prostatic hyperplasia)   . Chronic prostatitis   . Diabetes mellitus without complication ([place])   . Dyslipidemia   . Elevated PSA   . Gastric reflux   . Gout   . HTN (hypertension)   . Over weight   . Psoriasis   . Testicular pain, right   . Urinary frequency     Patient Active Problem List   Diagnosis Date Noted  . 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  . DOE (dyspnea on exertion) 11/15/2016  . Obesity 11/15/2016  . Elevated PSA 05/30/2015  . BPH with obstruction/lower urinary tract symptoms 05/30/2015  . Type 2 diabetes mellitus ([place]) 05/18/2014  . Arthritis 01/03/2014  . Dyslipidemia 01/03/2014  . Benign essential hypertension 01/03/2014  . Gout 01/03/2014  . Psoriasis 01/03/2014  . Reflux 01/03/2014    Past Surgical History:  Procedure Laterality Date  . hydrocelectomy    . PILONIDAL CYST EXCISION      Prior to Admission medications   Medication Sig Start Date End Date Taking? Authorizing Provider  allopurinol (ZYLOPRIM) 300 MG tablet Take 1 tablet (300 mg total) by mouth daily. 06/26/16   [name]., MD  amLODipine (NORVASC) 10 MG tablet TAKE ONE TABLET EVERY DAY 12/23/16   [name]., MD  aspirin EC 81 MG tablet Take 81 mg by mouth daily.     [provider]  Cholecalciferol (VITAMIN D3) 1000 UNITS CAPS Take 1,000 Units by mouth daily.     [provider]  finasteride (PROSCAR) 5 MG tablet Take 1 tablet (5 mg total) by mouth daily. 07/18/16   Zara Council A, PA-C  losartan (COZAAR) 50 MG tablet Take 1 tablet (50 mg total) by mouth daily. 12/02/16   [name]., MD     Allergies Metformin  Family History  Problem Relation Age of Onset  . Uterine cancer Mother   . Diabetes Mother   . Bladder Cancer Neg Hx   . Kidney disease Neg Hx   . Prostate cancer Neg Hx     Social History Social History  Substance Use Topics  . Smoking status: Former Smoker    Packs/day: 1.50    Years: 8.00    Types: Cigarettes  . Smokeless tobacco: Never Used     Comment: quit 40 years ago  . Alcohol use Not on file     Comment: rarely    Review of Systems  Constitutional: No fever/chills Eyes: No visual changes.  ENT: No sore throat. Cardiovascular: Denies chest pain. Respiratory: Denies shortness of breath. Gastrointestinal: No abdominal pain.  No nausea, no vomiting.   Genitourinary: Negative for dysuria. Musculoskeletal: Negative for back pain. Skin: Negative for rash. Neurological: Negative for headaches  ____________________________________________   PHYSICAL EXAM:  VITAL SIGNS: ED Triage Vitals [02/07/17 0945]  Enc Vitals Group     BP (!) 176/72     Pulse Rate 91     Resp 16     Temp 98.4 F (36.9 C)     Temp Source Oral     SpO2 96 %     Weight 295 lb (133.8 kg)     Height 5\' 10"  (1.778 m)     Head Circumference      Peak Flow      Pain Score      Pain Loc      Pain Edu?      Excl. in [place]?     Constitutional: Alert and oriented. No acute distress. Pleasant and interactive Eyes: Conjunctivae are normal.   Nose: No congestion/rhinnorhea. Mouth/Throat: Mucous membranes are moist.    Cardiovascular:  Normal rate, regular rhythm. Grossly normal heart sounds.  Good peripheral circulation. Respiratory: Normal respiratory effort.  No retractions. Lungs CTAB. Gastrointestinal: Soft and nontender. No distention.  No CVA tenderness. Genitourinary: deferred Musculoskeletal: No lower extremity tenderness nor edema.  Warm and well perfused Neurologic:  Normal speech and language. No gross focal neurologic deficits are appreciated.  Skin:  Skin is warm, dry and intact. No rash noted. Psychiatric: Mood and affect are normal. Speech and behavior are normal.  ____________________________________________   LABS (all labs ordered are listed, but only abnormal results are displayed)  Labs Reviewed  COMPREHENSIVE METABOLIC PANEL - Abnormal; Notable for the following:       Result Value   Glucose, Bld 105 (*)    ALT 15 (*)    All other components within normal limits  CULTURE, BLOOD (ROUTINE X 2)  CBC   ____________________________________________  EKG  None ____________________________________________  RADIOLOGY  None ____________________________________________   PROCEDURES  Procedure(s) performed: No    Critical Care performed: No ____________________________________________   INITIAL IMPRESSION / ASSESSMENT AND PLAN / ED COURSE  Pertinent labs & imaging results that were available during my care of the patient were reviewed by me and considered in my medical decision making (see chart for details).  Discussed with Dr. Vicente Males of gastroenterology regarding the patient's exam and he feels the patient still should be admitted for IV cefotaxime. Cefotaxime is on shortage so Rocephin 2 g ordered. Patient agrees to admission. Discussed with hospitalist    ____________________________________________   FINAL CLINICAL IMPRESSION(S) / ED DIAGNOSES  Final diagnoses:  Spontaneous bacterial peritonitis ([place])      NEW MEDICATIONS STARTED DURING THIS VISIT:  New Prescriptions     No medications on file     Note:  This document was prepared using Dragon voice recognition software and may include unintentional dictation errors.    Lavonia Drafts, MD 02/07/17 [phone]

## 2017-02-07 NOTE — ED Triage Notes (Signed)
States he was sent by his PCP after a paracentesis on Tuesday and the fluid came back with a lot of WBC, states he had 5L drawn off, denies any pain, awake and alert

## 2017-02-07 NOTE — Telephone Encounter (Signed)
Advised patient of possible peritonitis per Dr. Vicente Males.   Told patient to be seen at the ER asap for IV antibiotics. Advised WBC elevated and infection shown after paracentesis.   Patient to call when arrived at ER.

## 2017-02-07 NOTE — Consult Note (Addendum)
Peter Bellows MD  [address] [place], [place] [address] Phone: [phone] Fax : [phone]  Consultation  Referring Provider:     Dr Corky Downs Primary Care Physician:  [name]., MD Primary Gastroenterologist:  Dr. Vicente Males         Reason for Consultation:     SBP  Date of Admission:  02/07/2017 Date of Consultation:  02/07/2017         HPI:   Peter Ryan is a 66 y.o. male .   Summary of history : He was recently referred to me by Dr Mortimer Fries in pulmonary medicine for ascites. He has been seeing Dr Mortimer Fries for pleural effusions that have been recurrent ( on going since march 2018 )which was found to be an exudate- etiology seems to be unclear and has been recurrent. He also did have some mediastinal lymphadenopathy and is being evaluated by VATS.  [facility] abdomen 01/24/17 shows sludge in gall bladder, parencyma of the liver suggesting cirrhosis, moderate ascites. LFT's in 09/2016 were normal with albumin 4.2, CBC normal with platelet count of 319.  Till his last office visit with me on 01/29/17. Never knew he had liver issues in the past . Never consumed alcohol in excess, no family history of liver disease, Mostly gained weight over the years, recent weight at 315 lbs is his heaviest. Noticed belly getting more distended as he developed shortness of breath. His parents were over weight too. Has HTN and diabetes , has not been diagnosed with sleep apnea, denies having elevated lipids. No military service , no blood transfusion, no tatoos. Denies any illegal drug use .  Interval history 01/29/17-02/07/17  After his office visit I ordered a diagnostic and therapeutic paracentesis to determine the etiology of his ascites which I felt was secondary to NAFLD and causing a hepatic hydrothorax. [facility] doppler showed no thrombosis of hepatic and portal vasculature.  He underwent a paracentesis on 02/04/17 and 5 L was taken out which was cloudy and the fluid returned as positive for SBP meeting the neutrophil  criteria of 690, cultures have been negative so far.   He denies any symptoms of abdominal pain , fever . On a lowsodium diet  Past Medical History:  Diagnosis Date  . Arthritis   . BPH (benign prostatic hyperplasia)   . Chronic prostatitis   . Diabetes mellitus without complication ([place])   . Dyslipidemia   . Elevated PSA   . Gastric reflux   . Gout   . HTN (hypertension)   . Over weight   . Psoriasis   . Testicular pain, right   . Urinary frequency     Past Surgical History:  Procedure Laterality Date  . hydrocelectomy    . PILONIDAL CYST EXCISION      Prior to Admission medications   Medication Sig Start Date End Date Taking? Authorizing Provider  allopurinol (ZYLOPRIM) 300 MG tablet Take 1 tablet (300 mg total) by mouth daily. 06/26/16   [name]., MD  amLODipine (NORVASC) 10 MG tablet TAKE ONE TABLET EVERY DAY 12/23/16   [name]., MD  aspirin EC 81 MG tablet Take 81 mg by mouth daily.     [provider]  Cholecalciferol (VITAMIN D3) 1000 UNITS CAPS Take 1,000 Units by mouth daily.     [provider]  finasteride (PROSCAR) 5 MG tablet Take 1 tablet (5 mg total) by mouth daily. 07/18/16   Zara Council A, PA-C  losartan (COZAAR) 50 MG tablet  Take 1 tablet (50 mg total) by mouth daily. 12/02/16   [name]., MD    Family History  Problem Relation Age of Onset  . Uterine cancer Mother   . Diabetes Mother   . Bladder Cancer Neg Hx   . Kidney disease Neg Hx   . Prostate cancer Neg Hx      Social History  Substance Use Topics  . Smoking status: Former Smoker    Packs/day: 1.50    Years: 8.00    Types: Cigarettes  . Smokeless tobacco: Never Used     Comment: quit 40 years ago  . Alcohol use Not on file     Comment: rarely    Allergies as of 02/07/2017 - Review Complete 02/07/2017  Allergen Reaction Noted  . Metformin Other (See Comments) 05/23/2015    Review of Systems:    All systems reviewed and  negative except where noted in HPI.   Physical Exam:  Vital signs in last 24 hours: Temp:  [98.4 F (36.9 C)] 98.4 F (36.9 C) (05/11 0945) Pulse Rate:  [82-91] 82 (05/11 1123) Resp:  [16-18] 18 (05/11 1123) BP: (154-176)/(71-72) 154/71 (05/11 1123) SpO2:  [95 %-96 %] 95 % (05/11 1123) Weight:  [295 lb (133.8 kg)] 295 lb (133.8 kg) (05/11 0945)   General:   Pleasant, cooperative in NAD Head:  Normocephalic and atraumatic. Eyes:   No icterus.   Conjunctiva pink. PERRLA. Ears:  Normal auditory acuity. Neck:  Supple; no masses or thyroidomegaly, few spider angiomas over back and neck  Lungs: Respirations even and unlabored. Lungs clear to auscultation bilaterally.   No wheezes, crackles, or rhonchi.  Heart:  Regular rate and rhythm;  Without murmur, clicks, rubs or gallops Abdomen:  Soft,distended ,  nontender. Normal bowel sounds. No appreciable masses or hepatomegaly.  No rebound or guarding.  Rectal:  Not performed. Neurologic:  Alert and oriented x3;  grossly normal neurologically. Skin:  Intact without significant lesions or rashes. Cervical Nodes:  No significant cervical adenopathy. Psych:  Alert and cooperative. Normal affect.  LAB RESULTS:  Recent Labs  02/07/17 0951  WBC 9.0  HGB 16.3  HCT 47.9  PLT 283   BMET  Recent Labs  02/07/17 0951  NA 139  K 4.2  CL 102  CO2 28  GLUCOSE 105*  BUN 15  CREATININE 0.94  CALCIUM 9.0   LFT  Recent Labs  02/07/17 0951  PROT 7.4  ALBUMIN 4.0  AST 17  ALT 15*  ALKPHOS 58  BILITOT 0.7   PT/INR No results for input(s): LABPROT, INR in the last 72 hours.  STUDIES: No results found.    Impression / Plan:   Peter Ryan is a 66 y.o. y/o male who has been asked to come into the ER as his ascitic fluid returned positive with a high WCC and a ANC of 690 suggesting SBP. He has recently been seen by myself to be evaluated for his ascites, not known to have had liver disease in the past , has had recurrent pleural  effusions cause unknown , I suspected hepatic hydrothorax. Recent labs show that his serum albumin is 4  With a normal platelet count of 283 , INR 0.93 . Ascitic fluid protein is 4.8 ,albumin 3.1 . He does not have biochemical evidence of cirrhosis but his liver ultrasound suggests the same . The spleen is normal in size . ECHO shows grade 1 diastolic dysfunction   Impression   1. He has culture  negative neutrocytic ascites- At this time will treat him as SBP,. I will check the LDH on the ascitic fluid.  Suggest Cefotaxime 2 grams TID.   2. At some point he will need a shunt study to determine if he has hepatic hydrothorax which is performed with nuclear imaging .   3.  He has radiological evidence of cirrhosis but normal labs , normal size of the spleen going against portal hypertension , ascitic fluid protein is > 3 grams ( this usually does not change in SBP ) SAAG 0.9 which is not consistent with portal hypertension . I have spoken to the lab and they will add lipase,TGL,LDH to the original sample .s  4. If we do not have a clear reason for his ascites then a diagnostic laprascopy +/- peritoneal bx and possibly  liver biopsy performed by IR via internal jugular vein with measurements of portal pressures/wedge pressure will help determine if he has portal hypertension.   5. Ct scan of the abdomen and pelvis with IV and PO contrast to r/o any intrabdominal pathology as a lymphoma/ neoplasm/pancreatic abnormality contributing to ascites.  6. Check TSH   7. I will have Dr Tarri Glenn who is a hepatologist review and provide her opinion on Saturday .   I  spent over 25 mins speaking with the patient ,explaining to him about his results so far, plan ahead and possible differential diagnosis.    Thank you for involving me in the care of this patient.       LOS: 0 days   Peter Bellows, MD  02/07/2017, 12:02 PM

## 2017-02-08 DIAGNOSIS — R188 Other ascites: Secondary | ICD-10-CM

## 2017-02-08 DIAGNOSIS — K7460 Unspecified cirrhosis of liver: Secondary | ICD-10-CM

## 2017-02-08 LAB — BASIC METABOLIC PANEL
ANION GAP: 7 (ref 5–15)
BUN: 14 mg/dL (ref 6–20)
CO2: 31 mmol/L (ref 22–32)
Calcium: 8.5 mg/dL — ABNORMAL LOW (ref 8.9–10.3)
Chloride: 103 mmol/L (ref 101–111)
Creatinine, Ser: 1.05 mg/dL (ref 0.61–1.24)
GFR calc Af Amer: >60 mL/min (ref >60)
GLUCOSE: 103 mg/dL — AB (ref 65–99)
POTASSIUM: 3.9 mmol/L (ref 3.5–5.1)
Sodium: 141 mmol/L (ref 135–145)

## 2017-02-08 LAB — CBC
HEMATOCRIT: 46.6 % (ref 40.0–52.0)
Hemoglobin: 15.6 g/dL (ref 13.0–18.0)
MCH: 28 pg (ref 26.0–34.0)
MCHC: 33.6 g/dL (ref 32.0–36.0)
MCV: 83.3 fL (ref 80.0–100.0)
PLATELETS: 247 10*3/uL (ref 150–440)
RBC: 5.59 MIL/uL (ref 4.40–5.90)
RDW: 14.4 % (ref 11.5–14.5)
WBC: 8.5 10*3/uL (ref 3.8–10.6)

## 2017-02-08 LAB — GLUCOSE, CAPILLARY: Glucose-Capillary: 108 mg/dL — ABNORMAL HIGH (ref 65–99)

## 2017-02-08 MED ORDER — SPIRONOLACTONE 25 MG PO TABS
50.0000 mg | ORAL_TABLET | Freq: Two times a day (BID) | ORAL | Status: DC
  Administered 2017-02-08: 11:00:00 50 mg via ORAL
  Filled 2017-02-08: qty 2

## 2017-02-08 MED ORDER — SPIRONOLACTONE 50 MG PO TABS: 50.0000 mg | ORAL_TABLET | Freq: Two times a day (BID) | ORAL | 3 refills | Status: DC

## 2017-02-08 MED ORDER — FUROSEMIDE 20 MG PO TABS
20.0000 mg | ORAL_TABLET | Freq: Every day | ORAL | Status: DC
  Administered 2017-02-08: 11:00:00 20 mg via ORAL
  Filled 2017-02-08: qty 1

## 2017-02-08 MED ORDER — CIPROFLOXACIN HCL 500 MG PO TABS: 500.0000 mg | ORAL_TABLET | Freq: Two times a day (BID) | ORAL | 0 refills | Status: DC

## 2017-02-08 MED ORDER — FUROSEMIDE 20 MG PO TABS: 20.0000 mg | ORAL_TABLET | Freq: Every day | ORAL | 2 refills | Status: DC

## 2017-02-08 NOTE — Discharge Summary (Signed)
[facility] NAME: Peter Ryan    MR#:  [id number]  DATE OF BIRTH:  [birth year]  DATE OF ADMISSION:  02/07/2017 ADMITTING PHYSICIAN: Dustin Flock, MD  DATE OF DISCHARGE: 02/08/2017 11:45 AM  PRIMARY CARE PHYSICIAN: [name]., MD     ADMISSION DIAGNOSIS:  Spontaneous bacterial peritonitis ([place]) [K65.2] Bacterial peritonitis ([place]) [K65.9]  DISCHARGE DIAGNOSIS:  Active Problems:   SBP (spontaneous bacterial peritonitis) ([place])   Ascites   Liver cirrhosis ([place])   SECONDARY DIAGNOSIS:   Past Medical History:  Diagnosis Date  . Arthritis   . BPH (benign prostatic hyperplasia)   . Chronic prostatitis   . Cirrhosis of liver ([place])   . Diabetes mellitus without complication ([place])   . Dyslipidemia   . Elevated PSA   . Gastric reflux   . Gout   . HTN (hypertension)   . Over weight   . Psoriasis   . Testicular pain, right   . Urinary frequency     .pro HOSPITAL COURSE:   The patient is 65 year old Caucasian male with past medical history significant for history of BPH, diabetes, hyperlipidemia, gastroesophageal reflux disease, obesity, gout, who presented with recent abdominal distention and had the CT scan suggestive of liver cirrhosis of unclear etiology. The patient was seen by gastroenterologist and the head and ultrasound of abdomen, where portal vein thrombosis was ruled out. Patient was not noted to have them with her thrombocytopenia or coagulopathy. Paracentesis was performed 02/04/2017, 5 L of fluid was removed, spontaneous bacterial peritonitis was diagnosed. Cultures were negative. Patient was admitted to the hospital for further evaluation and treatment, was initiated on IV antibiotic. He felt satisfactory and the was felt to be stable to be discharged home on antibiotic therapy, to be continued for 7 days. During his stay in the hospital time he was seen by gastroenterologist/hepatologist,  recommendations are pending Discussion by problem: #1. Spontaneous bacterial peritonitis , patient is to continue ciprofloxacin 500 mg twice daily dose for 7 days to complete course, he is to follow-up with Dr. Vicente Males for further recommendations #2. Suspected liver cirrhosis, suspected Karlene Lineman, workup is pending, patient was seen by hepatologist, recommendations are pending #3. Diabetes mellitus, hemoglobin A1c was not done, blood glucose levels were ranging from 85-108 in the hospital #4. Obesity, hemoglobin A1c is pending, TSH was normal at 1.3, triglyceride level was 297, may benefit from TriCor initiation #5. Ascites, patient was initiated on spironolactone and Lasix, he was recommended to continue to follow his urinary output and weight, follow up with gastroenterologist for further recommendations DISCHARGE CONDITIONS:  Stable  CONSULTS OBTAINED:  Treatment Team:  Thornton Park, MD  DRUG ALLERGIES:   Allergies  Allergen Reactions  . Metformin Other (See Comments)    Stomach upset     DISCHARGE MEDICATIONS:   Discharge Medication List as of 02/08/2017 11:03 AM    START taking these medications   Details  ciprofloxacin (CIPRO) 500 MG tablet Take 1 tablet (500 mg total) by mouth 2 (two) times daily., Starting Sat 02/08/2017, Normal    furosemide (LASIX) 20 MG tablet Take 1 tablet (20 mg total) by mouth daily., Starting Sat 02/08/2017, Normal    spironolactone (ALDACTONE) 50 MG tablet Take 1 tablet (50 mg total) by mouth 2 (two) times daily., Starting Sat 02/08/2017, Normal      CONTINUE these medications which have NOT CHANGED   Details  allopurinol (ZYLOPRIM) 300 MG tablet Take 1 tablet (  300 mg total) by mouth daily., Starting Wed 06/26/2016, Normal    amLODipine (NORVASC) 10 MG tablet TAKE ONE TABLET EVERY DAY, Normal    aspirin EC 81 MG tablet Take 81 mg by mouth daily. , Historical Med    Cholecalciferol (VITAMIN D3) 1000 UNITS CAPS Take 1,000 Units by mouth daily. ,  Historical Med    finasteride (PROSCAR) 5 MG tablet Take 1 tablet (5 mg total) by mouth daily., Starting Thu 07/18/2016, Normal    losartan (COZAAR) 50 MG tablet Take 1 tablet (50 mg total) by mouth daily., Starting Mon 12/02/2016, Normal         DISCHARGE INSTRUCTIONS:    The patient is to follow-up with primary care physician and gastroenterologist as outpatient  If you experience worsening of your admission symptoms, develop shortness of breath, life threatening emergency, suicidal or homicidal thoughts you must seek medical attention immediately by calling 911 or calling your MD immediately  if symptoms less severe.  You Must read complete instructions/literature along with all the possible adverse reactions/side effects for all the Medicines you take and that have been prescribed to you. Take any new Medicines after you have completely understood and accept all the possible adverse reactions/side effects.   Please note  You were cared for by a hospitalist during your hospital stay. If you have any questions about your discharge medications or the care you received while you were in the hospital after you are discharged, you can call the unit and asked to speak with the hospitalist on call if the hospitalist that took care of you is not available. Once you are discharged, your primary care physician will handle any further medical issues. Please note that NO REFILLS for any discharge medications will be authorized once you are discharged, as it is imperative that you return to your primary care physician (or establish a relationship with a primary care physician if you do not have one) for your aftercare needs so that they can reassess your need for medications and monitor your lab values.    Today   CHIEF COMPLAINT:   Chief Complaint  Patient presents with  . Abnormal Lab    HISTORY OF PRESENT ILLNESS:     VITAL SIGNS:  Blood pressure (!) 148/73, pulse 80, temperature 98 F  (36.7 C), temperature source Oral, resp. rate (!) 23, height 5\' 10"  (1.778 m), weight 128.8 kg (284 lb), SpO2 95 %.  I/O:   Intake/Output Summary (Last 24 hours) at 02/08/17 1349 Last data filed at 02/08/17 1005  Gross per 24 hour  Intake              120 ml  Output                1 ml  Net              119 ml    PHYSICAL EXAMINATION:  GENERAL:  66 y.o.-year-old patient lying in the bed with no acute distress.  EYES: Pupils equal, round, reactive to light and accommodation. No scleral icterus. Extraocular muscles intact.  HEENT: Head atraumatic, normocephalic. Oropharynx and nasopharynx clear.  NECK:  Supple, no jugular venous distention. No thyroid enlargement, no tenderness.  LUNGS: Normal breath sounds bilaterally, no wheezing, rales,rhonchi or crepitation. No use of accessory muscles of respiration.  CARDIOVASCULAR: S1, S2 normal. No murmurs, rubs, or gallops.  ABDOMEN: Soft, non-tender, non-distended. Bowel sounds present. No organomegaly or mass.  EXTREMITIES: No pedal edema, cyanosis, or clubbing.  NEUROLOGIC:  Cranial nerves II through XII are intact. Muscle strength 5/5 in all extremities. Sensation intact. Gait not checked.  PSYCHIATRIC: The patient is alert and oriented x 3.  SKIN: No obvious rash, lesion, or ulcer.   DATA REVIEW:   CBC  Recent Labs Lab 02/08/17 0437  WBC 8.5  HGB 15.6  HCT 46.6  PLT 247    Chemistries   Recent Labs Lab 02/07/17 0951 02/08/17 0437  NA 139 141  K 4.2 3.9  CL 102 103  CO2 28 31  GLUCOSE 105* 103*  BUN 15 14  CREATININE 0.94 1.05  CALCIUM 9.0 8.5*  AST 17  --   ALT 15*  --   ALKPHOS 58  --   BILITOT 0.7  --     Cardiac Enzymes No results for input(s): TROPONINI in the last 168 hours.  Microbiology Results  Results for orders placed or performed during the hospital encounter of 02/07/17  Blood culture (routine x 2)     Status: None (Preliminary result)   Collection Time: 02/07/17  9:51 AM  Result Value Ref Range  Status   Specimen Description BLOOD  Final   Special Requests NONE  Final   Culture NO GROWTH < 24 HOURS  Final   Report Status PENDING  Incomplete    RADIOLOGY:  No results found.  EKG:   Orders placed or performed in visit on 10/28/16  . EKG 12-Lead      Management plans discussed with the patient, family and they are in agreement.  CODE STATUS:     Code Status Orders        Start     Ordered   02/07/17 1431  Full code  Continuous     02/07/17 1430    Code Status History    Date Active Date Inactive Code Status Order ID Comments User Context   This patient has a current code status but no historical code status.      TOTAL TIME TAKING CARE OF THIS PATIENT: 40 minutes.    Theodoro Grist M.D on 02/08/2017 at 1:49 PM  Between 7am to 6pm - Pager - [phone]  After 6pm go to [email or web address] - password [email or web address] [facility]  [facility]  Office  [phone]  CC: Primary care physician; [name]., MD

## 2017-02-08 NOTE — Progress Notes (Signed)
MD order received to discharge pt home today after Dr Tarri Glenn (GI) saw the pt; Dr Tarri Glenn verbalized to me that it is okay for the pt to discharge home today; verbally reviewed AVS with pt, no questions voiced at this time; pt's discharge pending pt getting dressed

## 2017-02-08 NOTE — Progress Notes (Signed)
Pt dressed and ready for discharge; pt discharged via wheelchair by nursing to the visitor's entrance

## 2017-02-10 ENCOUNTER — Telehealth: Payer: Self-pay

## 2017-02-10 ENCOUNTER — Other Ambulatory Visit: Payer: Self-pay

## 2017-02-10 DIAGNOSIS — R188 Other ascites: Principal | ICD-10-CM

## 2017-02-10 DIAGNOSIS — K7460 Unspecified cirrhosis of liver: Secondary | ICD-10-CM

## 2017-02-10 NOTE — Telephone Encounter (Signed)
Advised patient of Dr. Georgeann Oppenheim response to questions:  1. Continue antibiotics  2. Continue diuretics and low sodium diet for now till we figure out whats going on  3. CT scan of abdomen and pelvis with contrast IV and oral  4. BMP in 1-2 days    Pt scheduled for CT on Monday, 5/21st @ 230pm.  Advised to pick-up contrast and get labs.  NPO 4 hours

## 2017-02-10 NOTE — Telephone Encounter (Signed)
-----   Message from Jonathon Bellows, MD sent at 02/07/2017  9:45 AM EDT ----- Peter Ryan  Inform patient he has sponatneous bacterial peritonitis which is a life threatening condition   1. Needs to come into the ER to get evaluated 2. Needs IV cefotaxime 2 grams TID for atleast 24-48 hours and then change to PO if repeat tap shows improvement  3. If possible during admission can get nuclear medicine shunt study to check if he has hepatic hydrothorax  4. Life long SBP prophyalaxsis 5. I will have Dr Alyson Locket see the patient tomorrow if he comes in later today

## 2017-02-10 NOTE — Telephone Encounter (Signed)
Called to check-up on patient since hospital discharge   1.  Right side stomach distention. No pain or fever. Location where paracentesis took place. No sensitivity. Not sure if fluid pills will fix.   2.  Concerned that Lasix could cause high uric acid count due to patient's hx of gout.  Sent Dr. Vicente Males a message with patient's concerns.

## 2017-02-10 NOTE — Telephone Encounter (Signed)
-----   Message from Jonathon Bellows, MD sent at 02/10/2017  9:35 AM EDT ----- Regarding: RE: Admission Discharge Concerns 1. Continue antibiotics 2. Continue diuretics and low sodium diet for now till we figure out whats going on  3. CT scan of abdomen and pelvis with contrast IV and oral  4. BMP in 1-2 days   Follow up after CT scan in my office   Dr Jonathon Bellows  Gastroenterology/Hepatology Pager: [phone]  ----- Message ----- From: Leontine Locket, CMA Sent: 02/10/2017   9:23 AM To: Jonathon Bellows, MD Subject: Admission Discharge Concerns                   Spoke with Mr. Eudy as follow-up. Pt states:  1.  Right side stomach distention. No pain or fever. Location where paracentesis took place. No sensitivity. Not sure if fluid pills will fix.   2.  Concerned that Lasix could cause high uric acid count due to patient's hx of gout.  Please advise.

## 2017-02-12 ENCOUNTER — Other Ambulatory Visit
Admission: RE | Admit: 2017-02-12 | Discharge: 2017-02-12 | Disposition: A | Discharge status: Home / Self Care | Payer: BLUE CROSS/BLUE SHIELD | Source: Ambulatory Visit | Attending: Gastroenterology | Admitting: Gastroenterology

## 2017-02-12 DIAGNOSIS — K7460 Unspecified cirrhosis of liver: Secondary | ICD-10-CM | POA: Diagnosis present

## 2017-02-12 LAB — CULTURE, BLOOD (ROUTINE X 2): Culture: NO GROWTH

## 2017-02-12 LAB — BASIC METABOLIC PANEL
ANION GAP: 8 (ref 5–15)
BUN: 17 mg/dL (ref 6–20)
CHLORIDE: 103 mmol/L (ref 101–111)
CO2: 28 mmol/L (ref 22–32)
Calcium: 9 mg/dL (ref 8.9–10.3)
Creatinine, Ser: 1.18 mg/dL (ref 0.61–1.24)
GFR calc Af Amer: >60 mL/min (ref >60)
GFR calc non Af Amer: >60 mL/min (ref >60)
GLUCOSE: 101 mg/dL — AB (ref 65–99)
POTASSIUM: 4.2 mmol/L (ref 3.5–5.1)
SODIUM: 139 mmol/L (ref 135–145)

## 2017-02-12 LAB — LIPASE, FLUID: LIPASE-FLUID: 14 U/L

## 2017-02-13 NOTE — Telephone Encounter (Signed)
Received forms from [employer] .  Placed in lbpu box for md signature.

## 2017-02-13 NOTE — Telephone Encounter (Signed)
Forms placed in DS' folder to sign.

## 2017-02-17 ENCOUNTER — Ambulatory Visit: Admission: RE | Admit: 2017-02-17 | Payer: BLUE CROSS/BLUE SHIELD | Source: Ambulatory Visit

## 2017-02-17 ENCOUNTER — Other Ambulatory Visit: Payer: Self-pay | Admitting: Family Medicine

## 2017-02-18 ENCOUNTER — Encounter: Payer: Self-pay | Admitting: Family Medicine

## 2017-02-18 ENCOUNTER — Ambulatory Visit (INDEPENDENT_AMBULATORY_CARE_PROVIDER_SITE_OTHER): Payer: BLUE CROSS/BLUE SHIELD | Admitting: Family Medicine

## 2017-02-18 ENCOUNTER — Ambulatory Visit: Payer: BLUE CROSS/BLUE SHIELD | Admitting: Internal Medicine

## 2017-02-18 VITALS — BP 126/68 | HR 87 | Temp 98.3°F | Resp 16 | Wt 285.8 lb

## 2017-02-18 DIAGNOSIS — J040 Acute laryngitis: Secondary | ICD-10-CM

## 2017-02-18 NOTE — Progress Notes (Signed)
Subjective:     Patient ID: Peter Ryan, male   DOB: Jul 24, 1951, 66 y.o.   MRN: [id number]  HPI  Chief Complaint  Patient presents with  . Laryngitis    Patient comes in office today with concerns of hoarsness and higher pitch tone in his voice. Patient states that symptoms began 5/18, he denies sore throat or URI symptoms. Patient has been taking otc antihistamine for relief.    States he also started Zaditor for itchy eyes per his eye doctor. No precedent hx of allergic rhinitis reported. He does not use tobacco products. Has hx of reflux but not on medication at this time. States he made an ENT appointment for June.   Review of Systems     Objective:   Physical Exam  Constitutional: He appears well-developed and well-nourished. No distress.  HENT:  No tonsillar enlargemet or erythema  Lymphadenopathy:    He has no cervical adenopathy.       Assessment:    1. Laryngitis, acute: ? Mediated by allergies ? Nocturnal reflux    Plan:    Discussed use fluticasone nasal spray. If not improving after a week to start PPI's.

## 2017-02-18 NOTE — Patient Instructions (Signed)
Start with a steroid nasal spray like Flonase. Give it a few days to work. If not helping try an acid blocker like Prevacid or Prilosec. See ENT if not improving with the above.

## 2017-02-19 NOTE — Telephone Encounter (Signed)
Forms signed by DS and put in outgoing box to go to [place].

## 2017-02-20 ENCOUNTER — Ambulatory Visit: Payer: BLUE CROSS/BLUE SHIELD

## 2017-02-21 ENCOUNTER — Other Ambulatory Visit: Payer: BLUE CROSS/BLUE SHIELD

## 2017-02-27 ENCOUNTER — Ambulatory Visit: Admission: RE | Admit: 2017-02-27 | Payer: BLUE CROSS/BLUE SHIELD | Source: Ambulatory Visit

## 2017-02-27 LAB — FUNGUS CULTURE WITH STAIN

## 2017-02-27 LAB — FUNGAL ORGANISM REFLEX

## 2017-02-27 LAB — FUNGUS CULTURE RESULT

## 2017-02-28 LAB — CULTURE, FUNGUS WITHOUT SMEAR

## 2017-03-06 ENCOUNTER — Other Ambulatory Visit: Payer: Self-pay

## 2017-03-06 ENCOUNTER — Encounter: Payer: Self-pay | Admitting: Gastroenterology

## 2017-03-06 ENCOUNTER — Ambulatory Visit
Admission: RE | Admit: 2017-03-06 | Discharge: 2017-03-06 | Disposition: A | Discharge status: Home / Self Care | Payer: BLUE CROSS/BLUE SHIELD | Source: Ambulatory Visit | Attending: Gastroenterology | Admitting: Gastroenterology

## 2017-03-06 ENCOUNTER — Ambulatory Visit (INDEPENDENT_AMBULATORY_CARE_PROVIDER_SITE_OTHER): Payer: BLUE CROSS/BLUE SHIELD | Admitting: Gastroenterology

## 2017-03-06 ENCOUNTER — Other Ambulatory Visit
Admission: RE | Admit: 2017-03-06 | Discharge: 2017-03-06 | Disposition: A | Discharge status: Home / Self Care | Payer: BLUE CROSS/BLUE SHIELD | Source: Ambulatory Visit | Attending: Gastroenterology | Admitting: Gastroenterology

## 2017-03-06 VITALS — BP 131/72 | HR 86 | Ht 70.0 in | Wt 275.8 lb

## 2017-03-06 DIAGNOSIS — J90 Pleural effusion, not elsewhere classified: Secondary | ICD-10-CM | POA: Diagnosis present

## 2017-03-06 DIAGNOSIS — K760 Fatty (change of) liver, not elsewhere classified: Secondary | ICD-10-CM

## 2017-03-06 DIAGNOSIS — R188 Other ascites: Secondary | ICD-10-CM

## 2017-03-06 DIAGNOSIS — J9811 Atelectasis: Secondary | ICD-10-CM | POA: Diagnosis not present

## 2017-03-06 DIAGNOSIS — K7460 Unspecified cirrhosis of liver: Secondary | ICD-10-CM

## 2017-03-06 LAB — COMPREHENSIVE METABOLIC PANEL
ALT: 16 U/L — ABNORMAL LOW (ref 17–63)
AST: 14 U/L — ABNORMAL LOW (ref 15–41)
Albumin: 4.3 g/dL (ref 3.5–5.0)
Alkaline Phosphatase: 53 U/L (ref 38–126)
Anion gap: 10 (ref 5–15)
BUN: 47 mg/dL — ABNORMAL HIGH (ref 6–20)
CHLORIDE: 103 mmol/L (ref 101–111)
CO2: 21 mmol/L — ABNORMAL LOW (ref 22–32)
CREATININE: 1.64 mg/dL — AB (ref 0.61–1.24)
Calcium: 9.6 mg/dL (ref 8.9–10.3)
GFR, EST AFRICAN AMERICAN: 50 mL/min — AB (ref >60)
GFR, EST NON AFRICAN AMERICAN: 43 mL/min — AB (ref >60)
Glucose, Bld: 99 mg/dL (ref 65–99)
Potassium: 4.4 mmol/L (ref 3.5–5.1)
Sodium: 134 mmol/L — ABNORMAL LOW (ref 135–145)
Total Bilirubin: 0.7 mg/dL (ref 0.3–1.2)
Total Protein: 8 g/dL (ref 6.5–8.1)

## 2017-03-06 LAB — CBC
HCT: 45.1 % (ref 40.0–52.0)
Hemoglobin: 15.4 g/dL (ref 13.0–18.0)
MCH: 28.1 pg (ref 26.0–34.0)
MCHC: 34.1 g/dL (ref 32.0–36.0)
MCV: 82.5 fL (ref 80.0–100.0)
PLATELETS: 260 10*3/uL (ref 150–440)
RBC: 5.47 MIL/uL (ref 4.40–5.90)
RDW: 14.7 % — ABNORMAL HIGH (ref 11.5–14.5)
WBC: 9.6 10*3/uL (ref 3.8–10.6)

## 2017-03-06 LAB — PROTIME-INR
INR: 0.99
PROTHROMBIN TIME: 13.1 s (ref 11.4–15.2)

## 2017-03-06 NOTE — Progress Notes (Signed)
Peter Bellows MD, MRCP(U.K) [address]  [place]  [place], [place] [address]  Main: [phone]  Fax: [phone]   Primary Care Physician: [name]., MD  Primary Gastroenterologist:  Dr. Jonathon Ryan   Chief Complaint  Patient presents with  . Follow-up    HPI: Peter Ryan is a 66 y.o. male   He is here today for hospital follow up    Summary of history : He was recently referred to me by Dr Mortimer Fries in pulmonary medicine for ascites and was seen in 01/29/17 . He had been seeing Dr Mortimer Fries for pleural effusions that have been recurrent ( on going since march 2018 )which was found to be an exudate- etiology seems to be unclear and has been recurrent. He also did have some mediastinal lymphadenopathy and is being evaluated by VATS. USG abdomen 01/24/17 shows sludge in gall bladder, parencyma of the liver suggesting cirrhosis, moderate ascites. LFT's in 09/2016 were normal with albumin 4.2, CBC normal with platelet count of 319.Till his last office visit with me on 01/29/17. Never knew he had liver issues in the past . Never consumed alcohol in excess, no family history of liver disease, Mostly gained weight over the years, recent weight at 315 lbs is his heaviest. Noticed belly getting more distended as he developed shortness of breath. His parents were over weight too. Has HTN and diabetes , has not been diagnosed with sleep apnea, denies having elevated lipids. No military service , no blood transfusion, no tatoos. Denies any illegal drug use .After his initial office visit I ordered a diagnostic and therapeutic paracentesis to determine the etiology of his ascites which I felt was secondary to NAFLD and causing a hepatic hydrothorax. USG doppler showed no thrombosis of hepatic and portal vasculature. He underwent a paracentesis on 02/04/17 and 5 L was taken out which was cloudy and the fluid returned as positive for SBP meeting the neutrophil criteria of 690, culturesbeen negative  so far. He was admitted for a day to the hospital on 02/07/17 as I was very concerned about possible SBP.  I treated him as culture negative neutrocytic ascites.  He has had  radiological evidence of cirrhosis but normal labs , normal size of the spleen going against portal hypertension , ascitic fluid protein is > 3 grams ( this usually does not change in SBP ) SAAG 0.9 which is not consistent with portal hypertension .Triglycerides in the ascitic fluid was 297, TSH-normal , LDH elevated at 102 and lipase normal. No growth when checked for fungus on cultures , fluid did not grow any bacteria either ,AFB stains on ascitic fluid were ordered but not collected for unknown reason. He was discharged the next day on antibiotics, lasix and aldactone.   ECHO shows normal pulmonary pressure, no pericardial effusion , normal right ventricle size and function , left ventricular diastolic dysfunction grade 1    Interval history   02/12/2017-  03/06/2017   I had ordered a CT scan of his abdomen and pelvis to r/o any intrabdominal pathology as a lymphoma/ neoplasm/pancreatic abnormality contributing to ascites which was denied by his insurance and   He has lost 20 lbs since recent hospital visit on 02/07/17 . Does not have much energy , otherwise feels well. He is on lasix 20 mg and aldactone 50 mg    Current Outpatient Prescriptions  Medication Sig Dispense Refill  . allopurinol (ZYLOPRIM) 300 MG tablet TAKE ONE TABLET EVERY DAY 30 tablet 5  .  amLODipine (NORVASC) 10 MG tablet TAKE ONE TABLET EVERY DAY 30 tablet 6  . aspirin EC 81 MG tablet Take 81 mg by mouth daily.     . Cholecalciferol (VITAMIN D3) 1000 UNITS CAPS Take 1,000 Units by mouth daily.     . finasteride (PROSCAR) 5 MG tablet Take 1 tablet (5 mg total) by mouth daily. 90 tablet 4  . furosemide (LASIX) 20 MG tablet Take 1 tablet (20 mg total) by mouth daily. 30 tablet 2  . losartan (COZAAR) 50 MG tablet Take 1 tablet (50 mg total) by mouth daily. 90  tablet 3  . spironolactone (ALDACTONE) 50 MG tablet Take 1 tablet (50 mg total) by mouth 2 (two) times daily. 60 tablet 3  . SHINGRIX injection   0   No current facility-administered medications for this visit.     Allergies as of 03/06/2017 - Review Complete 03/06/2017  Allergen Reaction Noted  . Metformin Other (See Comments) 05/23/2015    ROS:  General: Negative for anorexia, weight loss, fever, chills, fatigue, weakness. ENT: Negative for hoarseness, difficulty swallowing , nasal congestion. CV: Negative for chest pain, angina, palpitations, dyspnea on exertion, peripheral edema.  Respiratory: Negative for dyspnea at rest, dyspnea on exertion, cough, sputum, wheezing.  GI: See history of present illness. GU:  Negative for dysuria, hematuria, urinary incontinence, urinary frequency, nocturnal urination.  Endo: Negative for unusual weight change.    Physical Examination:   BP 131/72 (BP Location: Left Arm, Patient Position: Sitting, Cuff Size: Large)   Pulse 86   Ht 5\' 10"  (1.778 m)   Wt 275 lb 12.8 oz (125.1 kg)   BMI 39.57 kg/m   General: Well-nourished, well-developed in no acute distress.  Eyes: No icterus. Conjunctivae pink. Mouth: Oropharyngeal mucosa moist and pink , no lesions erythema or exudate. Lungs: decreased air entry right infrascapular area  Heart: Regular rate and rhythm, no murmurs rubs or gallops.  Abdomen: Bowel sounds are normal, nontender, nondistended, no hepatosplenomegaly or masses, no abdominal bruits or hernia , no rebound or guarding.   Extremities: No lower extremity edema. No clubbing or deformities. Neuro: Alert and oriented x 3.  Grossly intact. Skin: Warm and dry, no jaundice.   Psych: Alert and cooperative, normal mood and affect.   Imaging Studies: No results found.  Assessment and Plan:   Peter Ryan is a 66 y.o. y/o male who is here to determine the etiology of his ascites -initial evaluation does not suggest etiology as  portal hypertension .He also has a pleural effusion which is also being evaluated by Dr Mortimer Fries in pulmonary.  Very complicated history as summarized above    Plan  1. Get CT scan of the abdomen for reasons described above   2. CMP,CBC,INR  3.At some point he will need a shunt study to determine if he has hepatic hydrothorax which is performed with nuclear imaging .   4. If we do not have a clear reason for his ascites then a diagnostic laprascopy +/- peritoneal bx and possibly  liver biopsy performed by IR via internal jugular vein with measurements of portal pressures/wedge pressure will help determine if he has portal hypertension.   5. CXR to evaluate for pleural effusions.   Discussed if the CT scan shows no gross abnormality , I will refer him to [facility] to have his case discussed ina multidisciplinary manner followed by possible liver biopsy and diagnostic laparascopy .   Clinically he is losing weight suggesting that he is losing his ascites  with diuretics.     Dr Peter Bellows  MD,MRCP [facility]) Follow up in 4 weeks

## 2017-03-07 ENCOUNTER — Telehealth: Payer: Self-pay | Admitting: Gastroenterology

## 2017-03-07 ENCOUNTER — Telehealth: Payer: Self-pay

## 2017-03-07 ENCOUNTER — Other Ambulatory Visit: Payer: Self-pay

## 2017-03-07 DIAGNOSIS — R188 Other ascites: Principal | ICD-10-CM

## 2017-03-07 DIAGNOSIS — K7460 Unspecified cirrhosis of liver: Secondary | ICD-10-CM

## 2017-03-07 LAB — ACID FAST SMEAR (AFB): ACID FAST SMEAR - AFSCU2: NEGATIVE

## 2017-03-07 LAB — ACID FAST SMEAR (AFB, MYCOBACTERIA)

## 2017-03-07 NOTE — Telephone Encounter (Signed)
Advised pt that Authorization has been received for CT Abd/Pelvis. Pt to contact radiology concerning using the contrast that he already has from previous scheduling.   [place], Friday, June 22nd @ 845am. NPO 4 hours Contrast prep   Per Dr. Vicente Males, pt is to have repeat BMP labs on Monday.  [place] [facility] for labs. Pt to have labs drawn on Monday by Lattie Haw. Faxed orders: [phone]

## 2017-03-07 NOTE — Telephone Encounter (Signed)
Sherry LVM and would like for you to call her regarding Jeff's instructions.

## 2017-03-08 LAB — ACID FAST CULTURE WITH REFLEXED SENSITIVITIES: ACID FAST CULTURE - AFSCU3: NEGATIVE

## 2017-03-10 NOTE — Telephone Encounter (Signed)
Advised patient that lab results were improving. Pt to continue increasing fluid intake. Stopped furosimide and spironolactone, per Dr. Vicente Males.   Labs to be re-checked in a week.

## 2017-03-11 ENCOUNTER — Encounter: Payer: Self-pay | Admitting: Gastroenterology

## 2017-03-18 ENCOUNTER — Telehealth: Payer: Self-pay

## 2017-03-18 ENCOUNTER — Other Ambulatory Visit: Payer: Self-pay

## 2017-03-18 DIAGNOSIS — K7460 Unspecified cirrhosis of liver: Secondary | ICD-10-CM

## 2017-03-18 NOTE — Telephone Encounter (Signed)
Advised patient of BMP labs to be completed per Dr. Vicente Males.

## 2017-03-21 ENCOUNTER — Ambulatory Visit
Admission: RE | Admit: 2017-03-21 | Discharge: 2017-03-21 | Disposition: A | Discharge status: Home / Self Care | Payer: BLUE CROSS/BLUE SHIELD | Source: Ambulatory Visit | Attending: Gastroenterology | Admitting: Gastroenterology

## 2017-03-21 ENCOUNTER — Other Ambulatory Visit
Admission: RE | Admit: 2017-03-21 | Discharge: 2017-03-21 | Disposition: A | Discharge status: Home / Self Care | Payer: BLUE CROSS/BLUE SHIELD | Source: Ambulatory Visit | Attending: Gastroenterology | Admitting: Gastroenterology

## 2017-03-21 DIAGNOSIS — R590 Localized enlarged lymph nodes: Secondary | ICD-10-CM | POA: Insufficient documentation

## 2017-03-21 DIAGNOSIS — I998 Other disorder of circulatory system: Secondary | ICD-10-CM | POA: Diagnosis not present

## 2017-03-21 DIAGNOSIS — R188 Other ascites: Secondary | ICD-10-CM | POA: Diagnosis not present

## 2017-03-21 DIAGNOSIS — K7460 Unspecified cirrhosis of liver: Secondary | ICD-10-CM | POA: Diagnosis present

## 2017-03-21 LAB — BASIC METABOLIC PANEL
ANION GAP: 6 (ref 5–15)
BUN: 16 mg/dL (ref 6–20)
CHLORIDE: 105 mmol/L (ref 101–111)
CO2: 26 mmol/L (ref 22–32)
Calcium: 8.6 mg/dL — ABNORMAL LOW (ref 8.9–10.3)
Creatinine, Ser: 1.1 mg/dL (ref 0.61–1.24)
GFR calc Af Amer: >60 mL/min (ref >60)
GFR calc non Af Amer: >60 mL/min (ref >60)
Glucose, Bld: 107 mg/dL — ABNORMAL HIGH (ref 65–99)
POTASSIUM: 4.1 mmol/L (ref 3.5–5.1)
SODIUM: 137 mmol/L (ref 135–145)

## 2017-03-21 LAB — POCT I-STAT CREATININE: Creatinine, Ser: 1 mg/dL (ref 0.61–1.24)

## 2017-03-21 MED ORDER — IOPAMIDOL (ISOVUE-370) INJECTION 76%
125.0000 mL | Freq: Once | INTRAVENOUS | Status: AC | PRN
  Administered 2017-03-21: 125 mL via INTRAVENOUS

## 2017-03-24 ENCOUNTER — Other Ambulatory Visit: Payer: Self-pay | Admitting: *Deleted

## 2017-03-24 ENCOUNTER — Encounter: Payer: Self-pay | Admitting: Oncology

## 2017-03-24 ENCOUNTER — Ambulatory Visit: Payer: BLUE CROSS/BLUE SHIELD | Admitting: Pulmonary Disease

## 2017-03-24 ENCOUNTER — Inpatient Hospital Stay: Payer: BLUE CROSS/BLUE SHIELD

## 2017-03-24 ENCOUNTER — Inpatient Hospital Stay: Payer: BLUE CROSS/BLUE SHIELD | Attending: Oncology | Admitting: Oncology

## 2017-03-24 ENCOUNTER — Telehealth: Payer: Self-pay

## 2017-03-24 VITALS — BP 134/75 | HR 84 | Temp 98.7°F | Resp 18 | Wt 288.4 lb

## 2017-03-24 DIAGNOSIS — K219 Gastro-esophageal reflux disease without esophagitis: Secondary | ICD-10-CM | POA: Diagnosis not present

## 2017-03-24 DIAGNOSIS — E669 Obesity, unspecified: Secondary | ICD-10-CM | POA: Diagnosis not present

## 2017-03-24 DIAGNOSIS — N400 Enlarged prostate without lower urinary tract symptoms: Secondary | ICD-10-CM | POA: Diagnosis not present

## 2017-03-24 DIAGNOSIS — R938 Abnormal findings on diagnostic imaging of other specified body structures: Secondary | ICD-10-CM | POA: Insufficient documentation

## 2017-03-24 DIAGNOSIS — K639 Disease of intestine, unspecified: Secondary | ICD-10-CM | POA: Diagnosis present

## 2017-03-24 DIAGNOSIS — J90 Pleural effusion, not elsewhere classified: Secondary | ICD-10-CM | POA: Diagnosis not present

## 2017-03-24 DIAGNOSIS — R5383 Other fatigue: Secondary | ICD-10-CM | POA: Insufficient documentation

## 2017-03-24 DIAGNOSIS — M1990 Unspecified osteoarthritis, unspecified site: Secondary | ICD-10-CM | POA: Diagnosis not present

## 2017-03-24 DIAGNOSIS — K760 Fatty (change of) liver, not elsewhere classified: Secondary | ICD-10-CM | POA: Insufficient documentation

## 2017-03-24 DIAGNOSIS — E785 Hyperlipidemia, unspecified: Secondary | ICD-10-CM | POA: Diagnosis not present

## 2017-03-24 DIAGNOSIS — Z87891 Personal history of nicotine dependence: Secondary | ICD-10-CM | POA: Insufficient documentation

## 2017-03-24 DIAGNOSIS — K6389 Other specified diseases of intestine: Secondary | ICD-10-CM

## 2017-03-24 DIAGNOSIS — R590 Localized enlarged lymph nodes: Secondary | ICD-10-CM | POA: Insufficient documentation

## 2017-03-24 DIAGNOSIS — M5134 Other intervertebral disc degeneration, thoracic region: Secondary | ICD-10-CM | POA: Diagnosis not present

## 2017-03-24 DIAGNOSIS — Z808 Family history of malignant neoplasm of other organs or systems: Secondary | ICD-10-CM | POA: Insufficient documentation

## 2017-03-24 DIAGNOSIS — K7460 Unspecified cirrhosis of liver: Secondary | ICD-10-CM | POA: Insufficient documentation

## 2017-03-24 DIAGNOSIS — M109 Gout, unspecified: Secondary | ICD-10-CM | POA: Diagnosis not present

## 2017-03-24 DIAGNOSIS — Z79899 Other long term (current) drug therapy: Secondary | ICD-10-CM | POA: Insufficient documentation

## 2017-03-24 DIAGNOSIS — E119 Type 2 diabetes mellitus without complications: Secondary | ICD-10-CM | POA: Insufficient documentation

## 2017-03-24 DIAGNOSIS — R188 Other ascites: Secondary | ICD-10-CM | POA: Insufficient documentation

## 2017-03-24 DIAGNOSIS — Z7982 Long term (current) use of aspirin: Secondary | ICD-10-CM | POA: Diagnosis not present

## 2017-03-24 DIAGNOSIS — R5381 Other malaise: Secondary | ICD-10-CM | POA: Diagnosis not present

## 2017-03-24 DIAGNOSIS — I10 Essential (primary) hypertension: Secondary | ICD-10-CM | POA: Insufficient documentation

## 2017-03-24 DIAGNOSIS — R9389 Abnormal findings on diagnostic imaging of other specified body structures: Secondary | ICD-10-CM

## 2017-03-24 LAB — CBC WITH DIFFERENTIAL/PLATELET
Basophils Absolute: 0.1 10*3/uL (ref 0–0.1)
Basophils Relative: 1 %
EOS PCT: 2 %
Eosinophils Absolute: 0.2 10*3/uL (ref 0–0.7)
HEMATOCRIT: 42 % (ref 40.0–52.0)
Hemoglobin: 14.1 g/dL (ref 13.0–18.0)
LYMPHS ABS: 0.9 10*3/uL — AB (ref 1.0–3.6)
LYMPHS PCT: 10 %
MCH: 27.7 pg (ref 26.0–34.0)
MCHC: 33.6 g/dL (ref 32.0–36.0)
MCV: 82.5 fL (ref 80.0–100.0)
Monocytes Absolute: 0.9 10*3/uL (ref 0.2–1.0)
Monocytes Relative: 10 %
Neutro Abs: 7.2 10*3/uL — ABNORMAL HIGH (ref 1.4–6.5)
Neutrophils Relative %: 77 %
PLATELETS: 234 10*3/uL (ref 150–440)
RBC: 5.09 MIL/uL (ref 4.40–5.90)
RDW: 14.9 % — ABNORMAL HIGH (ref 11.5–14.5)
WBC: 9.3 10*3/uL (ref 3.8–10.6)

## 2017-03-24 LAB — COMPREHENSIVE METABOLIC PANEL
ALK PHOS: 53 U/L (ref 38–126)
ALT: 14 U/L — AB (ref 17–63)
AST: 15 U/L (ref 15–41)
Albumin: 3.8 g/dL (ref 3.5–5.0)
Anion gap: 8 (ref 5–15)
BILIRUBIN TOTAL: 0.6 mg/dL (ref 0.3–1.2)
BUN: 14 mg/dL (ref 6–20)
CALCIUM: 8.7 mg/dL — AB (ref 8.9–10.3)
CO2: 24 mmol/L (ref 22–32)
CREATININE: 0.99 mg/dL (ref 0.61–1.24)
Chloride: 102 mmol/L (ref 101–111)
Glucose, Bld: 100 mg/dL — ABNORMAL HIGH (ref 65–99)
Potassium: 3.8 mmol/L (ref 3.5–5.1)
Sodium: 134 mmol/L — ABNORMAL LOW (ref 135–145)
TOTAL PROTEIN: 7.1 g/dL (ref 6.5–8.1)

## 2017-03-24 LAB — LACTATE DEHYDROGENASE: LDH: 118 U/L (ref 98–192)

## 2017-03-24 LAB — URIC ACID: URIC ACID, SERUM: 5.9 mg/dL (ref 4.4–7.6)

## 2017-03-24 NOTE — Telephone Encounter (Signed)
Contacted patient concerning results per Dr. Vicente Males.  Advised pt of enlarged lymph nodes in the abdomen. Patient has been referred to Dr. Janese Banks @ Oncology. Appointment made for this afternoon @ 315pm.

## 2017-03-24 NOTE — Progress Notes (Signed)
Hematology/Oncology Consult note [facility] Telephone:([phone] [phone]  Patient Care Team: [name]., MD as PCP - General (Family Medicine) [name], MD as Consulting Physician (Pulmonary Disease)   Name of the patient: Peter Ryan  [id number]  [birth year]    Reason for referral- mesenteric mass   Referring physician- Dr. Jonathon Bellows  Date of visit: 03/24/17   History of presenting illness- 1. Patient is a 66 year old male who initially presented with symptoms of worsening shortness of breath and was found to have right pleural effusion on chest x-ray. CT chest with contrast on 11/12/2016 revealed multiple lymph nodes throughout the mediastinum measuring approximately 1 cm in short axis. Upper abdomen showed evidence of ascites and mild cirrhotic changes in the liver. Patient underwent thoracocentesis for recurrent right pleural effusion. Fluid was exudative in nature and LDH in the pleural fluid was elevated. Cytology was negative for malignancy. Patient also had peripheral flow cytometry done which was negative for malignancy.  2. Ultrasound of the abdomen on 01/24/2017 showed sludge in the gallbladder and parenchymal of the liver suggesting cirrhosis as well as moderate ascites. Initially his ascites with attribute it to his cirrhosis and nonalcoholic fatty liver disease. He was also treated for culture negative neutrophilic SBP based on ascitic fluid analysis. Given that the ascites fluid was exudative in nature which is not consistent with cirrhosis from nonalcoholic fatty liver disease as well as no evidence of splenomegaly or abnormal LFTs, CT abdomen was obtained to rule out any other etiology for ascites  3.  CT abdomen on 03/21/2017 showed: IMPRESSION: 1. Bulky central mesenteric mass lesion encasing major mesenteric vascular anatomy without substantial mass-effect. Diffuse omental caking is evident and peritoneal  enhancement is identified. There is associated mesenteric, juxta diaphragmatic, retroperitoneal, and retrocrural lymphadenopathy. Lymphoma would be a consideration. Metastatic disease could also have this appearance. 2. Small a moderate volume ascites  4. Patient has been referred to [place] for the same. He currently reports some fatigue. Denies any unintentional weight loss. His appetite is fair denies any drenching night sweats or recurrent fevers  ECOG PS- 1  Pain scale- 0   Review of systems- Review of Systems  Constitutional: Positive for malaise/fatigue. Negative for chills, fever and weight loss.  HENT: Negative for congestion, ear discharge and nosebleeds.   Eyes: Negative for blurred vision.  Respiratory: Negative for cough, hemoptysis, sputum production, shortness of breath and wheezing.   Cardiovascular: Negative for chest pain, palpitations, orthopnea and claudication.  Gastrointestinal: Negative for abdominal pain, blood in stool, constipation, diarrhea, heartburn, melena, nausea and vomiting.  Genitourinary: Negative for dysuria, flank pain, frequency, hematuria and urgency.  Musculoskeletal: Negative for back pain, joint pain and myalgias.  Skin: Negative for rash.  Neurological: Negative for dizziness, tingling, focal weakness, seizures, weakness and headaches.  Endo/Heme/Allergies: Does not bruise/bleed easily.  Psychiatric/Behavioral: Negative for depression and suicidal ideas. The patient does not have insomnia.     Allergies  Allergen Reactions  . Metformin Other (See Comments)    Stomach upset     Patient Active Problem List   Diagnosis Date Noted  . Ascites 02/08/2017  . Liver cirrhosis ([place]) 02/08/2017  . SBP (spontaneous bacterial peritonitis) ([place]) 02/07/2017  . 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  . DOE (dyspnea on exertion) 11/15/2016  . Obesity 11/15/2016  . Elevated PSA 05/30/2015  . BPH with obstruction/lower urinary  tract symptoms 05/30/2015  . Type 2 diabetes mellitus ([place]) 05/18/2014  . Arthritis 01/03/2014  .  Dyslipidemia 01/03/2014  . Benign essential hypertension 01/03/2014  . Gout 01/03/2014  . Psoriasis 01/03/2014  . Reflux 01/03/2014     Past Medical History:  Diagnosis Date  . Arthritis   . BPH (benign prostatic hyperplasia)   . Chronic prostatitis   . Cirrhosis of liver ([place])   . Dyslipidemia   . Elevated PSA   . Gastric reflux   . Gout   . HTN (hypertension)   . Over weight   . Psoriasis   . Testicular pain, right   . Urinary frequency      Past Surgical History:  Procedure Laterality Date  . hydrocelectomy    . PILONIDAL CYST EXCISION      Social History   Social History  . Marital status: Married    Spouse name: N/A  . Number of children: N/A  . Years of education: N/A   Occupational History  . Not on file.   Social History Main Topics  . Smoking status: Former Smoker    Packs/day: 1.50    Years: 8.00    Types: Cigarettes  . Smokeless tobacco: Never Used     Comment: quit 40 years ago  . Alcohol use Not on file     Comment: rarely  . Drug use: No  . Sexual activity: Not on file   Other Topics Concern  . Not on file   Social History Narrative  . No narrative on file     Family History  Problem Relation Age of Onset  . Uterine cancer Mother   . Diabetes Mother   . Cancer Mother   . Cancer Maternal Uncle   . Bladder Cancer Neg Hx   . Kidney disease Neg Hx   . Prostate cancer Neg Hx      Current Outpatient Prescriptions:  .  allopurinol (ZYLOPRIM) 300 MG tablet, TAKE ONE TABLET EVERY DAY, Disp: 30 tablet, Rfl: 5 .  amLODipine (NORVASC) 10 MG tablet, TAKE ONE TABLET EVERY DAY, Disp: 30 tablet, Rfl: 6 .  aspirin EC 81 MG tablet, Take 81 mg by mouth daily. , Disp: , Rfl:  .  Cholecalciferol (VITAMIN D3) 1000 UNITS CAPS, Take 1,000 Units by mouth daily. , Disp: , Rfl:  .  finasteride (PROSCAR) 5 MG tablet, Take 1 tablet (5 mg total) by mouth  daily., Disp: 90 tablet, Rfl: 4 .  losartan (COZAAR) 50 MG tablet, Take 1 tablet (50 mg total) by mouth daily., Disp: 90 tablet, Rfl: 3 .  furosemide (LASIX) 20 MG tablet, Take 1 tablet (20 mg total) by mouth daily. (Patient not taking: Reported on 03/24/2017), Disp: 30 tablet, Rfl: 2 .  SHINGRIX injection, , Disp: , Rfl: 0 .  spironolactone (ALDACTONE) 50 MG tablet, Take 1 tablet (50 mg total) by mouth 2 (two) times daily. (Patient not taking: Reported on 03/24/2017), Disp: 60 tablet, Rfl: 3   Physical exam:  Vitals:   03/24/17 1526  BP: 134/75  Pulse: 84  Resp: 18  Temp: 98.7 F (37.1 C)  TempSrc: Tympanic  Weight: 288 lb 5.8 oz (130.8 kg)   Physical Exam  Constitutional: He is oriented to person, place, and time.  Patient is obese and appears in no acute distress  HENT:  Head: Normocephalic and atraumatic.  Eyes: EOM are normal. Pupils are equal, round, and reactive to light.  Neck: Normal range of motion.  Cardiovascular: Normal rate, regular rhythm and normal heart sounds.   Pulmonary/Chest: Effort normal and breath sounds normal.  Abdominal: Soft. Bowel  sounds are normal.  No palpable splenomegaly. Abdomen is distended  Lymphadenopathy:  No palpable cervical, supraclavicular, axillary or inguinal adenopathy  Neurological: He is alert and oriented to person, place, and time.  Skin: Skin is warm and dry.       CMP Latest Ref Rng & Units 03/24/2017  Glucose 65 - 99 mg/dL 100(H)  BUN 6 - 20 mg/dL 14  Creatinine 0.61 - 1.24 mg/dL 0.99  Sodium 135 - 145 mmol/L 134(L)  Potassium 3.5 - 5.1 mmol/L 3.8  Chloride 101 - 111 mmol/L 102  CO2 22 - 32 mmol/L 24  Calcium 8.9 - 10.3 mg/dL 8.7(L)  Total Protein 6.5 - 8.1 g/dL 7.1  Total Bilirubin 0.3 - 1.2 mg/dL 0.6  Alkaline Phos 38 - 126 U/L 53  AST 15 - 41 U/L 15  ALT 17 - 63 U/L 14(L)   CBC Latest Ref Rng & Units 03/24/2017  WBC 3.8 - 10.6 K/uL 9.3  Hemoglobin 13.0 - 18.0 g/dL 14.1  Hematocrit 40.0 - 52.0 % 42.0  Platelets  150 - 440 K/uL 234    No images are attached to the encounter.  Ct Abdomen Pelvis W Wo Contrast  Result Date: 03/21/2017 CLINICAL DATA:  Cirrhosis with ascites. EXAM: CT ABDOMEN AND PELVIS WITHOUT AND WITH CONTRAST TECHNIQUE: Multidetector CT imaging of the abdomen and pelvis was performed following the standard protocol before and following the bolus administration of intravenous contrast. CONTRAST:  125 cc Isovue 370 COMPARISON:  None. FINDINGS: Lower chest: Dependent atelectasis noted right lower lobe with moderate right pleural effusion and small left pleural effusion. Enlarged anterior right juxta diaphragmatic lymph nodes are evident, measuring up to 2.1 cm short axis on the right. Left anterior juxta diaphragmatic or lower mediastinal lymph node measures 1.8 cm short axis. Hepatobiliary: Nodular liver contour with subtle heterogeneity of the liver parenchyma is in keeping with the reported clinical history of cirrhosis. No focal mass lesion identified within the liver parenchyma. There is no evidence for gallstones, gallbladder wall thickening, or pericholecystic fluid. No intrahepatic or extrahepatic biliary dilation. Pancreas: No focal mass lesion. No dilatation of the main duct. No intraparenchymal cyst. No peripancreatic edema. Spleen: No splenomegaly. No focal mass lesion. Adrenals/Urinary Tract: No adrenal nodule or mass. Small low-density lesions in the inferior right kidney cannot be fully characterized. 5.6 cm low-density lesion in the lower pole the right kidney approaches water attenuation and is compatible with a cyst. No evidence for hydroureter. The urinary bladder appears normal for the degree of distention. Stomach/Bowel: Stomach is nondistended. No gastric wall thickening. No evidence of outlet obstruction. Duodenum is normally positioned as is the ligament of Treitz. Visualized small bowel loops are nondilated. Diverticular changes are noted in the left colon without evidence of  diverticulitis. Vascular/Lymphatic: No abdominal aortic aneurysm. Small lymph nodes are identified in the gastrohepatic ligament. 14 mm short axis retrocaval lymph node is seen on image 38. 15 mm short axis right retrocrural lymph node is visible on image 30. 7.3 x 13.5 cm mass is identified in the central small bowel mesentery, circumferentially encasing the superior mesenteric artery and vein. Diffuse omental caking is associated. Reproductive: The prostate gland and seminal vesicles have normal imaging features. Other: Small to moderate volume ascites. Peritoneal enhancement identified in the pelvis raises concern for peritoneal carcinomatosis. Musculoskeletal: Bone windows reveal no worrisome lytic or sclerotic osseous lesions. IMPRESSION: 1. Bulky central mesenteric mass lesion encasing major mesenteric vascular anatomy without substantial mass-effect. Diffuse omental caking is evident and peritoneal enhancement  is identified. There is associated mesenteric, juxta diaphragmatic, retroperitoneal, and retrocrural lymphadenopathy. Lymphoma would be a consideration. Metastatic disease could also have this appearance. 2. Small a moderate volume ascites Electronically Signed   By: Misty Stanley M.D.   On: 03/21/2017 11:07   Dg Chest 2 View  Result Date: 03/06/2017 CLINICAL DATA:  RIGHT-side pleural effusion, diabetes mellitus, hypertension, former smoker EXAM: CHEST  2 VIEW COMPARISON:  01/22/2017 FINDINGS: Enlargement of cardiac silhouette. Mediastinal contours and pulmonary vascularity normal. Bronchitic changes with small bibasilar pleural effusions and basilar atelectasis greater on RIGHT. Remaining lungs clear. No pneumothorax. Scattered degenerative disc disease changes thoracic spine. IMPRESSION: Mild bronchitic changes with small bibasilar pleural effusions and atelectasis greater on RIGHT. Enlargement of cardiac silhouette. Electronically Signed   By: Lavonia Dana M.D.   On: 03/06/2017 15:43     Assessment and plan- Patient is a 66 y.o. male referred to [place] for ascites, recurrent pleural effusions and mesenteric mass noted on CT abdomen  I personally reviewed his CT abdomen images and discussed the findings with the patient. I have also discussed the CT abdomen with Dr. Bartholome Bill from interventional radiology. There is evidence of significant mesenteric adenopathy along with omental caking and ascites which is concerning for malignancy. There was no other primary mass noted in the pancreas and kidney, urinary bladder or liver. Prostate was noted to be normal. Patient also noted to have mediastinal adenopathy on Ct thorax. This could be lymphoma versus solid tumor malignancy. At this time I will proceed with a PET CT scan to assess the extent of his disease. Based on the PET CT we will decide what can be biopsied. I did discuss with Dr. Bartholome Bill from radiology was of the opinion of that if there is no other site of biopsy patient would need his ascites drained the same day prior to attempting mesenteric mass biopsy. Today I will obtain CBC, CMP, LDH, uric acid, HIV and hepatitis B and hepatitis C testing. I will see the patient back in 2 weeks' time to discuss the results of his PET/CT and the results of his biopsy and further management. We will also obtain cytology of ascites fluid as it has not been done yet.   Thank you for this kind referral and the opportunity to participate in the care of this patient   Visit Diagnosis 1. Mesenteric mass   2. Abnormal CT scan     Dr. Randa Evens, MD, MPH [facility] at [facility] Pager- [phone] 03/24/2017

## 2017-03-24 NOTE — Progress Notes (Signed)
Patient here today as new evaluation regarding enlarged abdominal lymph nodes.  Referred by Dr. Vicente Males.

## 2017-03-25 ENCOUNTER — Ambulatory Visit: Payer: BLUE CROSS/BLUE SHIELD | Admitting: Pulmonary Disease

## 2017-03-25 LAB — HIV ANTIBODY (ROUTINE TESTING W REFLEX): HIV Screen 4th Generation wRfx: NONREACTIVE

## 2017-03-25 LAB — HEPATITIS B SURFACE ANTIBODY, QUANTITATIVE

## 2017-03-25 LAB — HEPATITIS B SURFACE ANTIGEN: Hepatitis B Surface Ag: NEGATIVE

## 2017-03-25 LAB — HEPATITIS B CORE ANTIBODY, TOTAL: Hep B Core Total Ab: NEGATIVE

## 2017-03-25 LAB — HEPATITIS C ANTIBODY: HCV Ab: <0.1 s/co ratio (ref 0.0–0.9)

## 2017-03-26 ENCOUNTER — Telehealth: Payer: Self-pay | Admitting: *Deleted

## 2017-03-26 NOTE — Telephone Encounter (Signed)
Received paperwork from [place] office in regards to pt's wife. Placed in DS' folder to sign.

## 2017-03-28 ENCOUNTER — Other Ambulatory Visit: Payer: Self-pay | Admitting: Oncology

## 2017-03-28 ENCOUNTER — Ambulatory Visit
Admission: RE | Admit: 2017-03-28 | Discharge: 2017-03-28 | Disposition: A | Discharge status: Home / Self Care | Payer: BLUE CROSS/BLUE SHIELD | Source: Ambulatory Visit | Attending: Oncology | Admitting: Oncology

## 2017-03-28 DIAGNOSIS — R938 Abnormal findings on diagnostic imaging of other specified body structures: Secondary | ICD-10-CM | POA: Insufficient documentation

## 2017-03-28 DIAGNOSIS — K639 Disease of intestine, unspecified: Secondary | ICD-10-CM | POA: Insufficient documentation

## 2017-03-28 DIAGNOSIS — K6389 Other specified diseases of intestine: Secondary | ICD-10-CM

## 2017-03-28 DIAGNOSIS — C786 Secondary malignant neoplasm of retroperitoneum and peritoneum: Secondary | ICD-10-CM | POA: Insufficient documentation

## 2017-03-28 DIAGNOSIS — N400 Enlarged prostate without lower urinary tract symptoms: Secondary | ICD-10-CM | POA: Insufficient documentation

## 2017-03-28 DIAGNOSIS — I700 Atherosclerosis of aorta: Secondary | ICD-10-CM | POA: Insufficient documentation

## 2017-03-28 DIAGNOSIS — R590 Localized enlarged lymph nodes: Secondary | ICD-10-CM | POA: Insufficient documentation

## 2017-03-28 DIAGNOSIS — R9389 Abnormal findings on diagnostic imaging of other specified body structures: Secondary | ICD-10-CM

## 2017-03-28 DIAGNOSIS — K668 Other specified disorders of peritoneum: Secondary | ICD-10-CM

## 2017-03-28 LAB — GLUCOSE, CAPILLARY: Glucose-Capillary: 106 mg/dL — ABNORMAL HIGH (ref 65–99)

## 2017-03-28 MED ORDER — FLUDEOXYGLUCOSE F - 18 (FDG) INJECTION
12.0000 | Freq: Once | INTRAVENOUS | Status: AC | PRN
  Administered 2017-03-28: 12.698 via INTRAVENOUS

## 2017-03-31 ENCOUNTER — Other Ambulatory Visit: Payer: Self-pay | Admitting: *Deleted

## 2017-03-31 ENCOUNTER — Telehealth: Payer: Self-pay | Admitting: *Deleted

## 2017-03-31 DIAGNOSIS — R188 Other ascites: Secondary | ICD-10-CM

## 2017-03-31 NOTE — Telephone Encounter (Signed)
Called patient and instructed him that he was having the biopsy in the am and to arrive at [facility] at 0900 and to be npo after midnight and that he could take his bp meds with a sip of water, voiced understanding, Dee in scheduling at [place] notified of patients ability to come to appt.

## 2017-04-01 ENCOUNTER — Other Ambulatory Visit: Payer: Self-pay | Admitting: Radiology

## 2017-04-01 ENCOUNTER — Other Ambulatory Visit: Payer: Self-pay | Admitting: Oncology

## 2017-04-01 ENCOUNTER — Ambulatory Visit
Admission: RE | Admit: 2017-04-01 | Discharge: 2017-04-01 | Disposition: A | Discharge status: Home / Self Care | Payer: BLUE CROSS/BLUE SHIELD | Source: Ambulatory Visit | Attending: Oncology | Admitting: Oncology

## 2017-04-01 DIAGNOSIS — R188 Other ascites: Secondary | ICD-10-CM

## 2017-04-01 DIAGNOSIS — I700 Atherosclerosis of aorta: Secondary | ICD-10-CM | POA: Insufficient documentation

## 2017-04-01 DIAGNOSIS — Z9889 Other specified postprocedural states: Secondary | ICD-10-CM | POA: Insufficient documentation

## 2017-04-01 DIAGNOSIS — M109 Gout, unspecified: Secondary | ICD-10-CM | POA: Diagnosis not present

## 2017-04-01 DIAGNOSIS — K668 Other specified disorders of peritoneum: Secondary | ICD-10-CM | POA: Insufficient documentation

## 2017-04-01 DIAGNOSIS — E785 Hyperlipidemia, unspecified: Secondary | ICD-10-CM | POA: Insufficient documentation

## 2017-04-01 DIAGNOSIS — Z888 Allergy status to other drugs, medicaments and biological substances status: Secondary | ICD-10-CM | POA: Insufficient documentation

## 2017-04-01 DIAGNOSIS — C786 Secondary malignant neoplasm of retroperitoneum and peritoneum: Secondary | ICD-10-CM | POA: Diagnosis not present

## 2017-04-01 DIAGNOSIS — I10 Essential (primary) hypertension: Secondary | ICD-10-CM | POA: Diagnosis not present

## 2017-04-01 DIAGNOSIS — Z7982 Long term (current) use of aspirin: Secondary | ICD-10-CM | POA: Diagnosis not present

## 2017-04-01 DIAGNOSIS — C8333 Diffuse large B-cell lymphoma, intra-abdominal lymph nodes: Secondary | ICD-10-CM | POA: Insufficient documentation

## 2017-04-01 DIAGNOSIS — J9811 Atelectasis: Secondary | ICD-10-CM | POA: Insufficient documentation

## 2017-04-01 DIAGNOSIS — N400 Enlarged prostate without lower urinary tract symptoms: Secondary | ICD-10-CM | POA: Diagnosis not present

## 2017-04-01 DIAGNOSIS — K219 Gastro-esophageal reflux disease without esophagitis: Secondary | ICD-10-CM | POA: Insufficient documentation

## 2017-04-01 DIAGNOSIS — Z79899 Other long term (current) drug therapy: Secondary | ICD-10-CM | POA: Insufficient documentation

## 2017-04-01 DIAGNOSIS — K7460 Unspecified cirrhosis of liver: Secondary | ICD-10-CM | POA: Insufficient documentation

## 2017-04-01 DIAGNOSIS — R590 Localized enlarged lymph nodes: Secondary | ICD-10-CM | POA: Diagnosis not present

## 2017-04-01 DIAGNOSIS — J90 Pleural effusion, not elsewhere classified: Secondary | ICD-10-CM | POA: Diagnosis not present

## 2017-04-01 LAB — PROTIME-INR
INR: 0.98
PROTHROMBIN TIME: 13 s (ref 11.4–15.2)

## 2017-04-01 MED ORDER — FENTANYL CITRATE (PF) 100 MCG/2ML IJ SOLN
INTRAMUSCULAR | Status: AC
  Filled 2017-04-01: qty 4

## 2017-04-01 MED ORDER — MIDAZOLAM HCL 2 MG/2ML IJ SOLN
INTRAMUSCULAR | Status: AC
  Filled 2017-04-01: qty 4

## 2017-04-01 MED ORDER — MIDAZOLAM HCL 2 MG/2ML IJ SOLN
INTRAMUSCULAR | Status: AC | PRN
  Administered 2017-04-01 (×2): 1 mg via INTRAVENOUS

## 2017-04-01 MED ORDER — SODIUM CHLORIDE 0.9 % IV SOLN
INTRAVENOUS | Status: DC
  Administered 2017-04-01: 10:00:00 via INTRAVENOUS

## 2017-04-01 MED ORDER — HYDROCODONE-ACETAMINOPHEN 5-325 MG PO TABS: 1.0000 | ORAL_TABLET | ORAL | Status: DC | PRN

## 2017-04-01 MED ORDER — FENTANYL CITRATE (PF) 100 MCG/2ML IJ SOLN
INTRAMUSCULAR | Status: AC | PRN
  Administered 2017-04-01 (×2): 50 ug via INTRAVENOUS

## 2017-04-01 NOTE — Discharge Instructions (Signed)
Needle Biopsy, Care After Refer to this sheet in the next few weeks. These instructions provide you with information about caring for yourself after your procedure. Your health care provider may also give you more specific instructions. Your treatment has been planned according to current medical practices, but problems sometimes occur. Call your health care provider if you have any problems or questions after your procedure. What can I expect after the procedure? After your procedure, it is common to have soreness, bruising, or mild pain at the biopsy site. This should go away in a few days. Follow these instructions at home:  Rest as directed by your health care provider.  No driving for 24 hours.  Take medicines only as directed by your health care provider.  There are many different ways to close and cover the biopsy site, including stitches (sutures), skin glue, and adhesive strips. Follow your health care provider's instructions about: ? Biopsy site care. ? Bandage (dressing) changes and removal. ? Biopsy site closure removal.  Check your biopsy site every day for signs of infection. Watch for: ? Redness, swelling, or pain. ? Fluid, blood, or pus. Contact a health care provider if:  You have a fever.  You have redness, swelling, or pain at the biopsy site that lasts longer than a few days.  You have fluid, blood, or pus coming from the biopsy site.  You feel nauseous.  You vomit. Get help right away if:  You have shortness of breath.  You have trouble breathing.  You have chest pain.  You feel dizzy or you faint.  You have bleeding that does not stop with pressure or a bandage.  You cough up blood.  You have pain in your abdomen. This information is not intended to replace advice given to you by your health care provider. Make sure you discuss any questions you have with your health care provider. Document Released: 01/31/2015 Document Revised: 02/22/2016 Document  Reviewed: 09/12/2014 Elsevier Interactive Patient Education  [employer].

## 2017-04-01 NOTE — H&P (Signed)
Referring Physician(s): Sindy Guadeloupe  Supervising Physician: Arne Cleveland  Patient Status:  [facility] OP  Chief Complaint:  "I'm having a biopsy"  Subjective: Pt familiar to IR service from prior thora/paracenteses, last of which was a paracentesis on 02/04/17 yielding 5 liters. Cytology was negative for malignancy. He has history of cirrhosis by imaging along with recurring pleural effusions. Recent PET scan has also revealed : Large hypermetabolic left eccentric mesenteric mass with extensive omental caking and peritoneal spread of tumor. This mechanism of spread would seem to favor a gastrointestinal primary, with entities such as carcinoid tumor or lymphoma as differential diagnostic considerations. Tissue diagnosis recommended. 2. Hypermetabolic mediastinal adenopathy compatible with malignancy. Moderate to large bilateral pleural effusions with passive atelectasis and low-grade activity along the pleural fluid-early malignant pleural effusions not excluded. 3.  Aortic Atherosclerosis (ICD10-I70.0). 4. No definite involvement of the neck or skeleton. 5. Moderately enlarged prostate gland  He presents today for image guided omental/mesenteric mass biopsy with possible paracentesis. He currently denies fever, HA, CP, cough, worsening abd pain,N/V or bleeding. He does have occ dyspnea with exertion , back pain, fatigue, some abd fullness. Past Medical History:  Diagnosis Date  . Arthritis   . BPH (benign prostatic hyperplasia)   . Chronic prostatitis   . Cirrhosis of liver ([place])   . Dyslipidemia   . Elevated PSA   . Gastric reflux   . Gout   . HTN (hypertension)   . Over weight   . Psoriasis   . Testicular pain, right   . Urinary frequency    Past Surgical History:  Procedure Laterality Date  . hydrocelectomy    . PILONIDAL CYST EXCISION       Allergies: Metformin  Medications: Prior to Admission medications   Medication Sig Start Date End Date Taking?  Authorizing Provider  allopurinol (ZYLOPRIM) 300 MG tablet TAKE ONE TABLET EVERY DAY 02/18/17   [name]., MD  amLODipine (NORVASC) 10 MG tablet TAKE ONE TABLET EVERY DAY 12/23/16   [name]., MD  aspirin EC 81 MG tablet Take 81 mg by mouth daily.     [provider]  Cholecalciferol (VITAMIN D3) 1000 UNITS CAPS Take 1,000 Units by mouth daily.     [provider]  finasteride (PROSCAR) 5 MG tablet Take 1 tablet (5 mg total) by mouth daily. 07/18/16   Zara Council A, PA-C  furosemide (LASIX) 20 MG tablet Take 1 tablet (20 mg total) by mouth daily. Patient not taking: Reported on 03/24/2017 02/08/17   Theodoro Grist, MD  losartan (COZAAR) 50 MG tablet Take 1 tablet (50 mg total) by mouth daily. 12/02/16   [name]., MD  [facility] injection  02/21/17   [provider]  spironolactone (ALDACTONE) 50 MG tablet Take 1 tablet (50 mg total) by mouth 2 (two) times daily. Patient not taking: Reported on 03/24/2017 02/08/17   Theodoro Grist, MD     Vital Signs: Vitals:   04/01/17 0907  BP: (!) 155/72  Temp: 97.8 F (36.6 C)      Physical Exam awake/alert; chest- sl dim BS bases, rt > left; heart- RRR; abd- obese, soft, sl dist,+BS,NT; ext- FROM  Imaging: Nm Pet Image Initial (pi) Skull Base To Thigh  Result Date: 03/28/2017 CLINICAL DATA:  Initial treatment strategy for central mesenteric mass with omental caking and peritoneal enhancement on reason imaging. EXAM: NUCLEAR MEDICINE PET SKULL BASE TO THIGH TECHNIQUE: Multiple exams, including 12.7 mCi F-18 FDG was  injected intravenously. Full-ring PET imaging was performed from the skull base to thigh after the radiotracer. CT data was obtained and used for attenuation correction and anatomic localization. FASTING BLOOD GLUCOSE:  Value: 106 mg/dl COMPARISON:  Multiple exams, including 03/21/2017 FINDINGS: NECK No hypermetabolic lymph nodes in the neck. CHEST Hypermetabolic 1.4 cm  prevascular lymph node on image 93/ 3 has maximum standard uptake value 8.3. Left upper internal mammary lymph node measuring 0.6 cm in short axis on image 93/ 3 has maximum standard uptake value 5.4. A left lower paraesophageal lymph node measuring 0.8 cm in short axis on image 119/ 3 has maximum SUV 6.6. There multiple large hypermetabolic pericardial lymph nodes. An index lesion eccentric to the right has a short axis diameter of 2.1 cm and maximum SUV 4.9. Moderate large bilateral pleural effusions with passive atelectasis. Very low-grade activity along the pleural fluid. ABDOMEN/PELVIS The large left eccentric central mesenteric mass has a maximum SUV of 23.4. There is diffuse peritoneal tumor deposition with omental caking. A representative lower abdominal region of omental caking has a maximum SUV of 8.5 in the midline. Nodular areas of hypermetabolic activity along the moderate ascites. No definite metastatic lesions in the liver, spleen, pancreas, or kidneys. Left kidney lower pole cyst noted. Right retrocrural node measuring 1.3 cm in short axis on image 152/3 has a maximum SUV of 13.9. Aortoiliac atherosclerotic vascular disease. Sigmoid colon diverticulosis. Moderately enlarged prostate gland. SKELETON No focal hypermetabolic activity to suggest skeletal metastasis. IMPRESSION: 1. Large hypermetabolic left eccentric mesenteric mass with extensive omental caking and peritoneal spread of tumor. This mechanism of spread would seem to favor a gastrointestinal primary, with entities such as carcinoid tumor or lymphoma as differential diagnostic considerations. Tissue diagnosis recommended. 2. Hypermetabolic mediastinal adenopathy compatible with malignancy. Moderate to large bilateral pleural effusions with passive atelectasis and low-grade activity along the pleural fluid-early malignant pleural effusions not excluded. 3.  Aortic Atherosclerosis (ICD10-I70.0). 4. No definite involvement of the neck or  skeleton. 5. Moderately enlarged prostate gland. Electronically Signed   By: Van Clines M.D.   On: 03/28/2017 12:32    Labs:  CBC:  Recent Labs  02/07/17 0951 02/08/17 0437 03/06/17 1606 03/24/17 1623  WBC 9.0 8.5 9.6 9.3  HGB 16.3 15.6 15.4 14.1  HCT 47.9 46.6 45.1 42.0  PLT 283 247 260 234    COAGS:  Recent Labs  01/29/17 0915 03/06/17 1606  INR 0.93 0.99    BMP:  Recent Labs  02/12/17 1326 03/06/17 1606 03/21/17 0909 03/21/17 1024 03/24/17 1623  NA 139 134*  --  137 134*  K 4.2 4.4  --  4.1 3.8  CL 103 103  --  105 102  CO2 28 21*  --  26 24  GLUCOSE 101* 99  --  107* 100*  BUN 17 47*  --  16 14  CALCIUM 9.0 9.6  --  8.6* 8.7*  CREATININE 1.18 1.64* 1.00 1.10 0.99  GFRNONAA >60 43*  --  >60 >60  GFRAA >60 50*  --  >60 >60    LIVER FUNCTION TESTS:  Recent Labs  10/29/16 1017 02/07/17 0951 03/06/17 1606 03/24/17 1623  BILITOT 0.3 0.7 0.7 0.6  AST 22 17 14* 15  ALT 25 15* 16* 14*  ALKPHOS 62 58 53 53  PROT 6.9 7.4 8.0 7.1  ALBUMIN 4.2 4.0 4.3 3.8    Assessment and Plan: Pt with history of cirrhosis by imaging along with recurring pleural effusions/ascites. Recent PET scan has  also revealed : Large hypermetabolic left eccentric mesenteric mass with extensive omental caking and peritoneal spread of tumor. This mechanism of spread would seem to favor a gastrointestinal primary, with entities such as carcinoid tumor or lymphoma as differential diagnostic considerations. Tissue diagnosis recommended. 2. Hypermetabolic mediastinal adenopathy compatible with malignancy. Moderate to large bilateral pleural effusions with passive atelectasis and low-grade activity along the pleural fluid-early malignant pleural effusions not excluded. 3.  Aortic Atherosclerosis (ICD10-I70.0). 4. No definite involvement of the neck or skeleton. 5. Moderately enlarged prostate gland  He presents today for image guided omental/mesenteric mass biopsy with  possible paracentesis.Risks and benefits discussed with the patient including, but not limited to bleeding, infection, damage to adjacent structures or low yield requiring additional tests. All of the patient's questions were answered, patient is agreeable to proceed. Consent signed and in chart.    Electronically Signed: D. Rowe Robert, PA-C 04/01/2017, 9:02 AM   I spent a total of 20 minutes at the the patient's bedside AND on the patient's hospital floor or unit, greater than 50% of which was counseling/coordinating care for image guided mesenteric/omenatla mass biopsy/possible paracentesis

## 2017-04-01 NOTE — Procedures (Signed)
  1. Paracentesis 2. CT core biopsy L abd mass  No complication No blood loss. See complete dictation in [facility].  Dillard Cannon MD Main # [phone] Pager  [phone]

## 2017-04-01 NOTE — Telephone Encounter (Signed)
Paperwork signed by DS and placed in inter office mail to go back to [place]. Nothing further needed.

## 2017-04-03 LAB — CYTOLOGY - NON PAP

## 2017-04-04 ENCOUNTER — Emergency Department: Payer: BLUE CROSS/BLUE SHIELD

## 2017-04-04 ENCOUNTER — Emergency Department
Admission: EM | Admit: 2017-04-04 | Discharge: 2017-04-04 | Disposition: A | Discharge status: Home / Self Care | Payer: BLUE CROSS/BLUE SHIELD | Attending: Emergency Medicine | Admitting: Emergency Medicine

## 2017-04-04 DIAGNOSIS — I10 Essential (primary) hypertension: Secondary | ICD-10-CM | POA: Insufficient documentation

## 2017-04-04 DIAGNOSIS — Z87891 Personal history of nicotine dependence: Secondary | ICD-10-CM | POA: Diagnosis not present

## 2017-04-04 DIAGNOSIS — E119 Type 2 diabetes mellitus without complications: Secondary | ICD-10-CM | POA: Diagnosis not present

## 2017-04-04 DIAGNOSIS — L7632 Postprocedural hematoma of skin and subcutaneous tissue following other procedure: Secondary | ICD-10-CM | POA: Insufficient documentation

## 2017-04-04 DIAGNOSIS — R109 Unspecified abdominal pain: Secondary | ICD-10-CM | POA: Diagnosis present

## 2017-04-04 DIAGNOSIS — R58 Hemorrhage, not elsewhere classified: Secondary | ICD-10-CM

## 2017-04-04 LAB — CBC WITH DIFFERENTIAL/PLATELET
Basophils Absolute: 0.1 10*3/uL (ref 0–0.1)
Basophils Relative: 1 %
EOS ABS: 0.2 10*3/uL (ref 0–0.7)
EOS PCT: 2 %
HCT: 40.5 % (ref 40.0–52.0)
HEMOGLOBIN: 13.8 g/dL (ref 13.0–18.0)
LYMPHS ABS: 1 10*3/uL (ref 1.0–3.6)
Lymphocytes Relative: 11 %
MCH: 28.5 pg (ref 26.0–34.0)
MCHC: 34 g/dL (ref 32.0–36.0)
MCV: 83.7 fL (ref 80.0–100.0)
MONO ABS: 1 10*3/uL (ref 0.2–1.0)
MONOS PCT: 11 %
Neutro Abs: 7.2 10*3/uL — ABNORMAL HIGH (ref 1.4–6.5)
Neutrophils Relative %: 75 %
PLATELETS: 264 10*3/uL (ref 150–440)
RBC: 4.84 MIL/uL (ref 4.40–5.90)
RDW: 15.7 % — ABNORMAL HIGH (ref 11.5–14.5)
WBC: 9.6 10*3/uL (ref 3.8–10.6)

## 2017-04-04 LAB — COMPREHENSIVE METABOLIC PANEL
ALK PHOS: 49 U/L (ref 38–126)
ALT: 12 U/L — ABNORMAL LOW (ref 17–63)
ANION GAP: 8 (ref 5–15)
AST: 19 U/L (ref 15–41)
Albumin: 3.5 g/dL (ref 3.5–5.0)
BUN: 18 mg/dL (ref 6–20)
CALCIUM: 8.6 mg/dL — AB (ref 8.9–10.3)
CO2: 26 mmol/L (ref 22–32)
Chloride: 104 mmol/L (ref 101–111)
Creatinine, Ser: 1.02 mg/dL (ref 0.61–1.24)
GFR calc non Af Amer: >60 mL/min (ref >60)
Glucose, Bld: 126 mg/dL — ABNORMAL HIGH (ref 65–99)
Potassium: 3.6 mmol/L (ref 3.5–5.1)
SODIUM: 138 mmol/L (ref 135–145)
Total Bilirubin: 0.5 mg/dL (ref 0.3–1.2)
Total Protein: 6.8 g/dL (ref 6.5–8.1)

## 2017-04-04 LAB — PROTIME-INR
INR: 0.98
Prothrombin Time: 13 seconds (ref 11.4–15.2)

## 2017-04-04 MED ORDER — IOPAMIDOL (ISOVUE-300) INJECTION 61%
15.0000 mL | INTRAVENOUS | Status: AC
  Administered 2017-04-04 (×2): 15 mL via ORAL

## 2017-04-04 MED ORDER — IOPAMIDOL (ISOVUE-300) INJECTION 61%
100.0000 mL | Freq: Once | INTRAVENOUS | Status: AC | PRN
  Administered 2017-04-04: 100 mL via INTRAVENOUS

## 2017-04-04 NOTE — ED Triage Notes (Signed)
Pt had a ct guiding biopsy Tuesday and today noticed large area of redness and purple color to the abdomin, states having some tightness to the abd. Denies pain.

## 2017-04-04 NOTE — ED Notes (Signed)
Patient transported to CT 

## 2017-04-04 NOTE — ED Provider Notes (Signed)
[facility] Emergency Department Provider Note       Time seen: ----------------------------------------- 8:28 PM on 04/04/2017 -----------------------------------------     I have reviewed the triage vital signs and the nursing notes.   HISTORY   Chief Complaint Post-op Problem    HPI Peter Ryan is a 66 y.o. male who presents to the ED for abdominal bruising. Patient has CT-guided biopsy on Tuesday and today he noticed large area of redness and purplish discoloration in his lower abdominal wall. He was having some tightness in his abdomen, denies any pain however. He denies fevers, chills, chest pain, shortness of breath, vomiting or diarrhea. Patient states he takes an aspirin daily, denies other recent trauma to the abdomen.   Past Medical History:  Diagnosis Date  . Arthritis   . BPH (benign prostatic hyperplasia)   . Chronic prostatitis   . Cirrhosis of liver ([place])   . Dyslipidemia   . Elevated PSA   . Gastric reflux   . Gout   . HTN (hypertension)   . Over weight   . Psoriasis   . Testicular pain, right   . Urinary frequency     Patient Active Problem List   Diagnosis Date Noted  . Ascites 02/08/2017  . Liver cirrhosis ([place]) 02/08/2017  . SBP (spontaneous bacterial peritonitis) ([place]) 02/07/2017  . 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  . DOE (dyspnea on exertion) 11/15/2016  . Obesity 11/15/2016  . Elevated PSA 05/30/2015  . BPH with obstruction/lower urinary tract symptoms 05/30/2015  . Type 2 diabetes mellitus ([place]) 05/18/2014  . Arthritis 01/03/2014  . Dyslipidemia 01/03/2014  . Benign essential hypertension 01/03/2014  . Gout 01/03/2014  . Psoriasis 01/03/2014  . Reflux 01/03/2014    Past Surgical History:  Procedure Laterality Date  . hydrocelectomy    . PILONIDAL CYST EXCISION      Allergies Metformin  Social History Social History  Substance Use Topics  . Smoking status: Former  Smoker    Packs/day: 1.50    Years: 8.00    Types: Cigarettes  . Smokeless tobacco: Never Used     Comment: quit 40 years ago  . Alcohol use No     Comment: rarely    Review of Systems Constitutional: Negative for fever. Cardiovascular: Negative for chest pain. Respiratory: Negative for shortness of breath. Gastrointestinal: Negative for abdominal pain, vomiting and diarrhea. Genitourinary: Negative for dysuria. Musculoskeletal: Negative for back pain. Skin:Positive for bruising in the abdominal wall Neurological: Negative for headaches, focal weakness or numbness.  All systems negative/normal/unremarkable except as stated in the HPI  ____________________________________________   PHYSICAL EXAM:  VITAL SIGNS: ED Triage Vitals [04/04/17 1857]  Enc Vitals Group     BP (!) 159/68     Pulse Rate 90     Resp 18     Temp 98 F (36.7 C)     Temp Source Oral     SpO2 97 %     Weight 278 lb (126.1 kg)     Height 5\' 10"  (1.778 m)     Head Circumference      Peak Flow      Pain Score 0     Pain Loc      Pain Edu?      Excl. in [place]?     Constitutional: Alert and oriented. Well appearing and in no distress. Eyes: Conjunctivae are normal. Normal extraocular movements. ENT   Head: Normocephalic and atraumatic.   Nose: No congestion/rhinnorhea.  Mouth/Throat: Mucous membranes are moist.   Neck: No stridor. Cardiovascular: Normal rate, regular rhythm. No murmurs, rubs, or gallops. Respiratory: Normal respiratory effort without tachypnea nor retractions. Breath sounds are clear and equal bilaterally. No wheezes/rales/rhonchi. Gastrointestinal: Distended but nontender, Normal bowel sounds. Very large area of bruising up is appreciated in the mid and left lower abdomen.  Musculoskeletal: Nontender with normal range of motion in extremities. No lower extremity tenderness nor edema. Neurologic:  Normal speech and language. No gross focal neurologic deficits are  appreciated.  Skin: Large left sided abdominal wall ecchymosis is noted as above Psychiatric: Mood and affect are normal. Speech and behavior are normal.  ___________________________________________  ED COURSE:  Pertinent labs & imaging results that were available during my care of the patient were reviewed by me and considered in my medical decision making (see chart for details). Patient presents for postprocedural ecchymosis, we will assess with labs and imaging as indicated.   Procedures ____________________________________________   LABS (pertinent positives/negatives)  Labs Reviewed  CBC WITH DIFFERENTIAL/PLATELET - Abnormal; Notable for the following:       Result Value   RDW 15.7 (*)    Neutro Abs 7.2 (*)    All other components within normal limits  COMPREHENSIVE METABOLIC PANEL - Abnormal; Notable for the following:    Glucose, Bld 126 (*)    Calcium 8.6 (*)    ALT 12 (*)    All other components within normal limits  PROTIME-INR    RADIOLOGY Images were viewed by me  CT the abdomen and pelvis with contrast IMPRESSION: 1. Bilateral pleural effusions and ascites similar to prior exam. 2. Slightly larger right retrocrural and juxta diaphragmatic lymph nodes since prior exam with stable central mesenteric mass which partially encases the SMA, estimated at 13.9 x 7.6 cm versus 13.5 x 7.3 cm at the same level. ____________________________________________  FINAL ASSESSMENT AND PLAN  Postprocedural bruising  Plan: Patient's labs and imaging were dictated above. Patient had presented for Bruising after recent mesenteric biopsy. CT scan does not show any concerning bleeding for postprocedural complication. He is stable for outpatient follow-up.   Earleen Newport, MD   Note: This note was generated in part or whole with voice recognition software. Voice recognition is usually quite accurate but there are transcription errors that can and very often do occur. I  apologize for any typographical errors that were not detected and corrected.     Earleen Newport, MD 04/04/17 2245

## 2017-04-04 NOTE — ED Notes (Signed)
Pt back from CT

## 2017-04-04 NOTE — ED Notes (Signed)
MD at bedside. 

## 2017-04-04 NOTE — ED Notes (Signed)
CT called and per Erasmo Downer pt is to drink 2nd bottle of contrast at 2130. CT to come get pt around 2200

## 2017-04-08 ENCOUNTER — Telehealth: Payer: Self-pay | Admitting: *Deleted

## 2017-04-08 ENCOUNTER — Encounter: Payer: Self-pay | Admitting: Oncology

## 2017-04-08 ENCOUNTER — Inpatient Hospital Stay: Payer: BLUE CROSS/BLUE SHIELD | Attending: Oncology | Admitting: Oncology

## 2017-04-08 VITALS — BP 167/77 | HR 84 | Temp 98.4°F | Resp 18 | Wt 291.3 lb

## 2017-04-08 DIAGNOSIS — R5383 Other fatigue: Secondary | ICD-10-CM | POA: Insufficient documentation

## 2017-04-08 DIAGNOSIS — R531 Weakness: Secondary | ICD-10-CM | POA: Diagnosis not present

## 2017-04-08 DIAGNOSIS — Z79899 Other long term (current) drug therapy: Secondary | ICD-10-CM | POA: Diagnosis not present

## 2017-04-08 DIAGNOSIS — M109 Gout, unspecified: Secondary | ICD-10-CM | POA: Diagnosis not present

## 2017-04-08 DIAGNOSIS — C8333 Diffuse large B-cell lymphoma, intra-abdominal lymph nodes: Secondary | ICD-10-CM | POA: Diagnosis present

## 2017-04-08 DIAGNOSIS — I700 Atherosclerosis of aorta: Secondary | ICD-10-CM | POA: Insufficient documentation

## 2017-04-08 DIAGNOSIS — Z7982 Long term (current) use of aspirin: Secondary | ICD-10-CM

## 2017-04-08 DIAGNOSIS — Z7689 Persons encountering health services in other specified circumstances: Secondary | ICD-10-CM | POA: Insufficient documentation

## 2017-04-08 DIAGNOSIS — Z87891 Personal history of nicotine dependence: Secondary | ICD-10-CM | POA: Insufficient documentation

## 2017-04-08 DIAGNOSIS — M1990 Unspecified osteoarthritis, unspecified site: Secondary | ICD-10-CM

## 2017-04-08 DIAGNOSIS — J90 Pleural effusion, not elsewhere classified: Secondary | ICD-10-CM | POA: Diagnosis not present

## 2017-04-08 DIAGNOSIS — N400 Enlarged prostate without lower urinary tract symptoms: Secondary | ICD-10-CM

## 2017-04-08 DIAGNOSIS — E785 Hyperlipidemia, unspecified: Secondary | ICD-10-CM | POA: Diagnosis not present

## 2017-04-08 DIAGNOSIS — R188 Other ascites: Secondary | ICD-10-CM

## 2017-04-08 DIAGNOSIS — C786 Secondary malignant neoplasm of retroperitoneum and peritoneum: Secondary | ICD-10-CM | POA: Insufficient documentation

## 2017-04-08 DIAGNOSIS — Z7189 Other specified counseling: Secondary | ICD-10-CM | POA: Insufficient documentation

## 2017-04-08 DIAGNOSIS — R5381 Other malaise: Secondary | ICD-10-CM | POA: Insufficient documentation

## 2017-04-08 DIAGNOSIS — E669 Obesity, unspecified: Secondary | ICD-10-CM | POA: Diagnosis not present

## 2017-04-08 DIAGNOSIS — K7460 Unspecified cirrhosis of liver: Secondary | ICD-10-CM

## 2017-04-08 DIAGNOSIS — K219 Gastro-esophageal reflux disease without esophagitis: Secondary | ICD-10-CM | POA: Insufficient documentation

## 2017-04-08 DIAGNOSIS — I10 Essential (primary) hypertension: Secondary | ICD-10-CM | POA: Insufficient documentation

## 2017-04-08 DIAGNOSIS — Z5111 Encounter for antineoplastic chemotherapy: Secondary | ICD-10-CM | POA: Insufficient documentation

## 2017-04-08 MED ORDER — LIDOCAINE-PRILOCAINE 2.5-2.5 % EX CREA: TOPICAL_CREAM | CUTANEOUS | 3 refills | Status: DC

## 2017-04-08 MED ORDER — PREDNISONE 20 MG PO TABS: 40.0000 mg/m2 | ORAL_TABLET | Freq: Every day | ORAL | 6 refills | Status: DC

## 2017-04-08 MED ORDER — PROCHLORPERAZINE MALEATE 10 MG PO TABS: 10.0000 mg | ORAL_TABLET | Freq: Four times a day (QID) | ORAL | 6 refills | Status: DC | PRN

## 2017-04-08 MED ORDER — ALLOPURINOL 300 MG PO TABS: 300.0000 mg | ORAL_TABLET | Freq: Every day | ORAL | 3 refills | Status: DC

## 2017-04-08 MED ORDER — ONDANSETRON HCL 8 MG PO TABS: 8.0000 mg | ORAL_TABLET | Freq: Two times a day (BID) | ORAL | 1 refills | Status: DC | PRN

## 2017-04-08 MED ORDER — LORAZEPAM 0.5 MG PO TABS: 0.5000 mg | ORAL_TABLET | Freq: Four times a day (QID) | ORAL | 0 refills | Status: DC | PRN

## 2017-04-08 NOTE — Telephone Encounter (Signed)
Call placed to patient to advise of prescriptions sent to pharamcy that are not to be started until he starts chemotherapy. I had to leave a message for patient on his VM

## 2017-04-08 NOTE — Progress Notes (Addendum)
Hematology/Oncology Consult note [facility]  [phone] [phone]  Patient Care Team: [name]., MD as PCP - General (Family Medicine) [name], MD as Consulting Physician (Pulmonary Disease)   Name of the patient: Peter Ryan  [id number]  May 06, 1951   Date of visit: 04/08/17  Diagnosis- Stage IV DLBCL IPI score- low intermediate risk  Chief complaint/ Reason for visit- discuss pathology results  Heme/Onc history: 1. Patient is a 66 year old male who initially presented with symptoms of worsening shortness of breath and was found to have right pleural effusion on chest x-ray. CT chest with contrast on 11/12/2016 revealed multiple lymph nodes throughout the mediastinum measuring approximately 1 cm in short axis. Upper abdomen showed evidence of ascites and mild cirrhotic changes in the liver. Patient underwent thoracocentesis for recurrent right pleural effusion. Fluid was exudative in nature and LDH in the pleural fluid was elevated. Cytology was negative for malignancy. Patient also had peripheral flow cytometry done which was negative for malignancy. Pleural and peritoneal fluid cytology has been negative for malignancy/ lymphoma  2. Ultrasound of the abdomen on 01/24/2017 showed sludge in the gallbladder and parenchymal of the liver suggesting cirrhosis as well as moderate ascites. Initially his ascites with attribute it to his cirrhosis and nonalcoholic fatty liver disease. He was also treated for culture negative neutrophilic SBP based on ascitic fluid analysis. Given that the ascites fluid was exudative in nature which is not consistent with cirrhosis from nonalcoholic fatty liver disease as well as no evidence of splenomegaly or abnormal LFTs, CT abdomen was obtained to rule out any other etiology for ascites  3.  CT abdomen on 03/21/2017 showed: IMPRESSION: 1. Bulky central mesenteric mass lesion encasing major  mesenteric vascular anatomy without substantial mass-effect. Diffuse omental caking is evident and peritoneal enhancement is identified. There is associated mesenteric, juxta diaphragmatic, retroperitoneal, and retrocrural lymphadenopathy. Lymphoma would be a consideration. Metastatic disease could also have this appearance. 2. Small a moderate volume ascites  4. Patient denies any unintentional weight loss. His appetite is fair denies any drenching night sweats or recurrent fevers. Does report fatigue  5. PET CT showed: IMPRESSION: 1. Large hypermetabolic left eccentric mesenteric mass with extensive omental caking and peritoneal spread of tumor. This mechanism of spread would seem to favor a gastrointestinal primary, with entities such as carcinoid tumor or lymphoma as differential diagnostic considerations. Tissue diagnosis recommended. 2. Hypermetabolic mediastinal adenopathy compatible with malignancy. Moderate to large bilateral pleural effusions with passive atelectasis and low-grade activity along the pleural fluid-early malignant pleural effusions not excluded. 3.  Aortic Atherosclerosis (ICD10-I70.0). 4. No definite involvement of the neck or skeleton. 5. Moderately enlarged prostate gland.  6. Omental biopsy showed: DIAGNOSIS:  A. SOFT TISSUE MASS, LEFT MESENTERY; CT-GUIDED CORE BIOPSY:  - LARGE B-CELL LYMPHOMA, CD10 POSITIVE.   Comment:  There is a proliferation of neoplastic lymphoid cells in a sclerotic  background. The infiltrate is predominantly diffuse, with some areas  containing small nodules. The cytologic features are similar in all  areas and the nodularity may be an artifact of the sclerosis. The  lymphoid cells are large, with irregular nuclei, small nucleoli, and  pale cytoplasm. The flow cytometry and immunohistochemistry (IHC)  results are consistent with diffuse large B-cell lymphoma, not otherwise  specified, germinal center B-cell type, but final  classification will be  based on the results of FISH analysis for MYC, BCL2, and BCL6 gene  rearrangements. Based on this sample, a coexisting follicular  lymphoma  cannot be excluded   Interval history- reports fatigue. Did have significant abdominal wall bruising post biopsy. Reports abdomen feels more distended. Denies any pain  ECOG PS- 1 Pain scale- 0   Review of systems- Review of Systems  Constitutional: Negative for chills, fever, malaise/fatigue and weight loss.  HENT: Negative for congestion, ear discharge and nosebleeds.   Eyes: Negative for blurred vision.  Respiratory: Negative for cough, hemoptysis, sputum production, shortness of breath and wheezing.   Cardiovascular: Negative for chest pain, palpitations, orthopnea and claudication.  Gastrointestinal: Negative for abdominal pain, blood in stool, constipation, diarrhea, heartburn, melena, nausea and vomiting.  Genitourinary: Negative for dysuria, flank pain, frequency, hematuria and urgency.  Musculoskeletal: Negative for back pain, joint pain and myalgias.  Skin: Negative for rash.  Neurological: Negative for dizziness, tingling, focal weakness, seizures, weakness and headaches.  Endo/Heme/Allergies: Does not bruise/bleed easily.  Psychiatric/Behavioral: Negative for depression and suicidal ideas. The patient does not have insomnia.       Allergies  Allergen Reactions  . Metformin Other (See Comments)    Stomach upset      Past Medical History:  Diagnosis Date  . Arthritis   . BPH (benign prostatic hyperplasia)   . Chronic prostatitis   . Cirrhosis of liver ([place])   . Dyslipidemia   . Elevated PSA   . Gastric reflux   . Gout   . HTN (hypertension)   . Over weight   . Psoriasis   . Testicular pain, right   . Urinary frequency      Past Surgical History:  Procedure Laterality Date  . hydrocelectomy    . PILONIDAL CYST EXCISION      Social History   Social History  . Marital status: Married      Spouse name: N/A  . Number of children: N/A  . Years of education: N/A   Occupational History  . Not on file.   Social History Main Topics  . Smoking status: Former Smoker    Packs/day: 1.50    Years: 8.00    Types: Cigarettes  . Smokeless tobacco: Never Used     Comment: quit 40 years ago  . Alcohol use No     Comment: rarely  . Drug use: No  . Sexual activity: Not on file   Other Topics Concern  . Not on file   Social History Narrative  . No narrative on file    Family History  Problem Relation Age of Onset  . Uterine cancer Mother   . Diabetes Mother   . Cancer Mother   . Cancer Maternal Uncle   . Bladder Cancer Neg Hx   . Kidney disease Neg Hx   . Prostate cancer Neg Hx      Current Outpatient Prescriptions:  .  allopurinol (ZYLOPRIM) 300 MG tablet, TAKE ONE TABLET EVERY DAY, Disp: 30 tablet, Rfl: 5 .  amLODipine (NORVASC) 10 MG tablet, TAKE ONE TABLET EVERY DAY, Disp: 30 tablet, Rfl: 6 .  aspirin EC 81 MG tablet, Take 81 mg by mouth daily. , Disp: , Rfl:  .  Cholecalciferol (VITAMIN D3) 1000 UNITS CAPS, Take 1,000 Units by mouth daily. , Disp: , Rfl:  .  finasteride (PROSCAR) 5 MG tablet, Take 1 tablet (5 mg total) by mouth daily., Disp: 90 tablet, Rfl: 4 .  losartan (COZAAR) 50 MG tablet, Take 1 tablet (50 mg total) by mouth daily., Disp: 90 tablet, Rfl: 3 .  furosemide (LASIX) 20 MG tablet, Take 1  tablet (20 mg total) by mouth daily. (Patient not taking: Reported on 03/24/2017), Disp: 30 tablet, Rfl: 2 .  spironolactone (ALDACTONE) 50 MG tablet, Take 1 tablet (50 mg total) by mouth 2 (two) times daily. (Patient not taking: Reported on 04/08/2017), Disp: 60 tablet, Rfl: 3  Physical exam:  Vitals:   04/08/17 1530  BP: (!) 167/77  Pulse: 84  Resp: 18  Temp: 98.4 F (36.9 C)  TempSrc: Tympanic  Weight: 291 lb 4.8 oz (132.1 kg)   Physical Exam  Constitutional: He is oriented to person, place, and time.  Pt is obese. Appears in no acute distress  HENT:   Head: Normocephalic and atraumatic.  Eyes: EOM are normal. Pupils are equal, round, and reactive to light.  Neck: Normal range of motion.  Cardiovascular: Normal rate, regular rhythm and normal heart sounds.   Pulmonary/Chest: Effort normal and breath sounds normal.  Abdominal: Soft. Bowel sounds are normal.  Scattered areas of abdominal wall bruising noted  Neurological: He is alert and oriented to person, place, and time.  Skin: Skin is warm and dry.   no palpable axillary, cervical or supraclavicular adenopathy  CMP Latest Ref Rng & Units 04/04/2017  Glucose 65 - 99 mg/dL 126(H)  BUN 6 - 20 mg/dL 18  Creatinine 0.61 - 1.24 mg/dL 1.02  Sodium 135 - 145 mmol/L 138  Potassium 3.5 - 5.1 mmol/L 3.6  Chloride 101 - 111 mmol/L 104  CO2 22 - 32 mmol/L 26  Calcium 8.9 - 10.3 mg/dL 8.6(L)  Total Protein 6.5 - 8.1 g/dL 6.8  Total Bilirubin 0.3 - 1.2 mg/dL 0.5  Alkaline Phos 38 - 126 U/L 49  AST 15 - 41 U/L 19  ALT 17 - 63 U/L 12(L)   CBC Latest Ref Rng & Units 04/04/2017  WBC 3.8 - 10.6 K/uL 9.6  Hemoglobin 13.0 - 18.0 g/dL 13.8  Hematocrit 40.0 - 52.0 % 40.5  Platelets 150 - 440 K/uL 264    No images are attached to the encounter.  Ct Abdomen Pelvis W Wo Contrast  Result Date: 03/21/2017 CLINICAL DATA:  Cirrhosis with ascites. EXAM: CT ABDOMEN AND PELVIS WITHOUT AND WITH CONTRAST TECHNIQUE: Multidetector CT imaging of the abdomen and pelvis was performed following the standard protocol before and following the bolus administration of intravenous contrast. CONTRAST:  125 cc Isovue 370 COMPARISON:  None. FINDINGS: Lower chest: Dependent atelectasis noted right lower lobe with moderate right pleural effusion and small left pleural effusion. Enlarged anterior right juxta diaphragmatic lymph nodes are evident, measuring up to 2.1 cm short axis on the right. Left anterior juxta diaphragmatic or lower mediastinal lymph node measures 1.8 cm short axis. Hepatobiliary: Nodular liver contour with  subtle heterogeneity of the liver parenchyma is in keeping with the reported clinical history of cirrhosis. No focal mass lesion identified within the liver parenchyma. There is no evidence for gallstones, gallbladder wall thickening, or pericholecystic fluid. No intrahepatic or extrahepatic biliary dilation. Pancreas: No focal mass lesion. No dilatation of the main duct. No intraparenchymal cyst. No peripancreatic edema. Spleen: No splenomegaly. No focal mass lesion. Adrenals/Urinary Tract: No adrenal nodule or mass. Small low-density lesions in the inferior right kidney cannot be fully characterized. 5.6 cm low-density lesion in the lower pole the right kidney approaches water attenuation and is compatible with a cyst. No evidence for hydroureter. The urinary bladder appears normal for the degree of distention. Stomach/Bowel: Stomach is nondistended. No gastric wall thickening. No evidence of outlet obstruction. Duodenum is normally positioned as  is the ligament of Treitz. Visualized small bowel loops are nondilated. Diverticular changes are noted in the left colon without evidence of diverticulitis. Vascular/Lymphatic: No abdominal aortic aneurysm. Small lymph nodes are identified in the gastrohepatic ligament. 14 mm short axis retrocaval lymph node is seen on image 38. 15 mm short axis right retrocrural lymph node is visible on image 30. 7.3 x 13.5 cm mass is identified in the central small bowel mesentery, circumferentially encasing the superior mesenteric artery and vein. Diffuse omental caking is associated. Reproductive: The prostate gland and seminal vesicles have normal imaging features. Other: Small to moderate volume ascites. Peritoneal enhancement identified in the pelvis raises concern for peritoneal carcinomatosis. Musculoskeletal: Bone windows reveal no worrisome lytic or sclerotic osseous lesions. IMPRESSION: 1. Bulky central mesenteric mass lesion encasing major mesenteric vascular anatomy without  substantial mass-effect. Diffuse omental caking is evident and peritoneal enhancement is identified. There is associated mesenteric, juxta diaphragmatic, retroperitoneal, and retrocrural lymphadenopathy. Lymphoma would be a consideration. Metastatic disease could also have this appearance. 2. Small a moderate volume ascites Electronically Signed   By: Misty Stanley M.D.   On: 03/21/2017 11:07   Ct Abdomen Pelvis W Contrast  Result Date: 04/04/2017 CLINICAL DATA:  Tightness of the abdomen with erythema and purplish color. EXAM: CT ABDOMEN AND PELVIS WITH CONTRAST TECHNIQUE: Multidetector CT imaging of the abdomen and pelvis was performed using the standard protocol following bolus administration of intravenous contrast. CONTRAST:  138m ISOVUE-300 IOPAMIDOL (ISOVUE-300) INJECTION 61% COMPARISON:  03/21/2017 CT and CT-guided paracentesis images 04/01/2017. FINDINGS: Lower chest: Normal size cardiac chambers. No pericardial effusion. Moderate to large right and small to moderate left pleural effusions with compressive atelectasis. Hepatobiliary: Nodular liver surface consistent with cirrhosis. Tiny mm hypodensity in the left hepatic lobe is unchanged but too small further characterize. No biliary dilatation is noted. The gallbladder is physiologically distended in appearance without wall thickening. Probable biliary sludge accounting for faint hyperdensities noted within. Pancreas: No mass or ductal dilatation. Spleen: Normal in size without focal abnormality. Adrenals/Urinary Tract: No adrenal mass. Stable 5.6 x 3.9 cm cyst arising from the anterior aspect of the left lower pole of the kidney, unchanged in appearance, series 2, image 36. No hydroureter. Urinary bladder is unremarkable for degree of distention. Stomach/Bowel: Contrast filled nondistended stomach with normal small bowel rotation. Contrast reaches the cecum. There is stool within much of the large intestine without obstruction noted. Scattered colonic  diverticulosis along sigmoid colon is identified without diverticulitis. Vascular/Lymphatic: No abdominal aortic aneurysm. Minimally larger right retrocrural lymph node at 18 mm short axis versus 15 mm previously. 12 mm retrocaval lymph node is stable versus 14 mm previously. Enlarged anterior juxtadiaphragmatic lymph nodes are again noted measuring 2.4 and 1.9 cm versus 2.1 and 1.8 cm previously. Once again there is a central mesenteric mass encasing portions of the SMA measuring 13.9 x 7.6 cm versus 13.5 x 7.3 cm at the same level previously. Diffuse omental caking is seen. Reproductive: Top-normal size prostate gland with peripheral zone calcifications. Other: Small to moderate amount of ascites is again noted. Peritoneal enhancement suggestive carcinomatosis. Musculoskeletal: No worrisome lytic or sclerotic osseous lesions. IMPRESSION: 1. Bilateral pleural effusions and ascites similar to prior exam. 2. Slightly larger right retrocrural and juxta diaphragmatic lymph nodes since prior exam with stable central mesenteric mass which partially encases the SMA, estimated at 13.9 x 7.6 cm versus 13.5 x 7.3 cm at the same level. Electronically Signed   By: DAshley RoyaltyM.D.   On: 04/04/2017  22:41   Nm Pet Image Initial (pi) Skull Base To Thigh  Result Date: 03/28/2017 CLINICAL DATA:  Initial treatment strategy for central mesenteric mass with omental caking and peritoneal enhancement on reason imaging. EXAM: NUCLEAR MEDICINE PET SKULL BASE TO THIGH TECHNIQUE: Multiple exams, including 12.7 mCi F-18 FDG was injected intravenously. Full-ring PET imaging was performed from the skull base to thigh after the radiotracer. CT data was obtained and used for attenuation correction and anatomic localization. FASTING BLOOD GLUCOSE:  Value: 106 mg/dl COMPARISON:  Multiple exams, including 03/21/2017 FINDINGS: NECK No hypermetabolic lymph nodes in the neck. CHEST Hypermetabolic 1.4 cm prevascular lymph node on image 93/ 3 has  maximum standard uptake value 8.3. Left upper internal mammary lymph node measuring 0.6 cm in short axis on image 93/ 3 has maximum standard uptake value 5.4. A left lower paraesophageal lymph node measuring 0.8 cm in short axis on image 119/ 3 has maximum SUV 6.6. There multiple large hypermetabolic pericardial lymph nodes. An index lesion eccentric to the right has a short axis diameter of 2.1 cm and maximum SUV 4.9. Moderate large bilateral pleural effusions with passive atelectasis. Very low-grade activity along the pleural fluid. ABDOMEN/PELVIS The large left eccentric central mesenteric mass has a maximum SUV of 23.4. There is diffuse peritoneal tumor deposition with omental caking. A representative lower abdominal region of omental caking has a maximum SUV of 8.5 in the midline. Nodular areas of hypermetabolic activity along the moderate ascites. No definite metastatic lesions in the liver, spleen, pancreas, or kidneys. Left kidney lower pole cyst noted. Right retrocrural node measuring 1.3 cm in short axis on image 152/3 has a maximum SUV of 13.9. Aortoiliac atherosclerotic vascular disease. Sigmoid colon diverticulosis. Moderately enlarged prostate gland. SKELETON No focal hypermetabolic activity to suggest skeletal metastasis. IMPRESSION: 1. Large hypermetabolic left eccentric mesenteric mass with extensive omental caking and peritoneal spread of tumor. This mechanism of spread would seem to favor a gastrointestinal primary, with entities such as carcinoid tumor or lymphoma as differential diagnostic considerations. Tissue diagnosis recommended. 2. Hypermetabolic mediastinal adenopathy compatible with malignancy. Moderate to large bilateral pleural effusions with passive atelectasis and low-grade activity along the pleural fluid-early malignant pleural effusions not excluded. 3.  Aortic Atherosclerosis (ICD10-I70.0). 4. No definite involvement of the neck or skeleton. 5. Moderately enlarged prostate gland.  Electronically Signed   By: Van Clines M.D.   On: 03/28/2017 12:32   Ct Biopsy  Result Date: 04/01/2017 CLINICAL DATA:  Left mesenteric mass with omental caking, pleural effusions, and ascites. Previous pleural and peritoneal cytology negative. EXAM: CT GUIDED PARACENTESIS CT GUIDED CORE BIOPSY OF LEFT MESENTERIC MASS ANESTHESIA/SEDATION: Intravenous Fentanyl and Versed were administered as conscious sedation during continuous monitoring of the patient's level of consciousness and physiological / cardiorespiratory status by the radiology RN, with a total moderate sedation time of 23 minutes. PROCEDURE: The procedure risks, benefits, and alternatives were explained to the patient. Questions regarding the procedure were encouraged and answered. The patient understands and consents to the procedure. Survey ultrasound of the abdomen was performed. Moderate ascites was identified. An appropriate skin entry site was determined and marked. Skin was prepped with chlorhexidine and infiltrated locally with 1% lidocaine. A Safe-T-Centesis needle was advanced into the peritoneal space. Approximately 2 L of chylous fluid were removed, sample sent for cytology analysis. Subsequently, an appropriate skin entry site for biopsy was determined and marked. The operative field was prepped with chlorhexidinein a sterile fashion, and a sterile drape was applied covering the operative  field. A sterile gown and sterile gloves were used for the procedure. Local anesthesia was provided with 1% Lidocaine. Under CT fluoroscopic guidance, a 17 gauge trocar needle was advanced to the margin of the left mesenteric lesion. Once needle tip position was confirmed, coaxial 18-gauge core biopsy samples were obtained, submitted in saline to surgical pathology. The guide needle was removed. The patient tolerated the procedure well. COMPLICATIONS: None immediate FINDINGS: Initial CT demonstrated moderate bilateral pleural effusions and  moderate abdominal ascites. Omental caking and the left mesenteric mass again localized corresponding to findings on recent PET-CT. Enlarged pericardial and retrocrural lymph nodes again noted. Because of the re- accumulation of ascites, paracentesis was performed previous deep core biopsy removing 2 L of chylous fluid. CT guided core biopsy of the left mesenteric mass was then performed without complication. IMPRESSION: 1. Paracentesis with CT guidance removing 2 L, sample sent for cytology analysis. 2. Technically successful CT-guided core biopsy of left mesenteric mass. Electronically Signed   By: Lucrezia Europe M.D.   On: 04/01/2017 12:15   Ct Image Guided Drainage Percut Cath  Peritoneal Retroperit  Result Date: 04/01/2017 CLINICAL DATA:  Left mesenteric mass with omental caking, pleural effusions, and ascites. Previous pleural and peritoneal cytology negative. EXAM: CT GUIDED PARACENTESIS CT GUIDED CORE BIOPSY OF LEFT MESENTERIC MASS ANESTHESIA/SEDATION: Intravenous Fentanyl and Versed were administered as conscious sedation during continuous monitoring of the patient's level of consciousness and physiological / cardiorespiratory status by the radiology RN, with a total moderate sedation time of 23 minutes. PROCEDURE: The procedure risks, benefits, and alternatives were explained to the patient. Questions regarding the procedure were encouraged and answered. The patient understands and consents to the procedure. Survey ultrasound of the abdomen was performed. Moderate ascites was identified. An appropriate skin entry site was determined and marked. Skin was prepped with chlorhexidine and infiltrated locally with 1% lidocaine. A Safe-T-Centesis needle was advanced into the peritoneal space. Approximately 2 L of chylous fluid were removed, sample sent for cytology analysis. Subsequently, an appropriate skin entry site for biopsy was determined and marked. The operative field was prepped with chlorhexidinein a  sterile fashion, and a sterile drape was applied covering the operative field. A sterile gown and sterile gloves were used for the procedure. Local anesthesia was provided with 1% Lidocaine. Under CT fluoroscopic guidance, a 17 gauge trocar needle was advanced to the margin of the left mesenteric lesion. Once needle tip position was confirmed, coaxial 18-gauge core biopsy samples were obtained, submitted in saline to surgical pathology. The guide needle was removed. The patient tolerated the procedure well. COMPLICATIONS: None immediate FINDINGS: Initial CT demonstrated moderate bilateral pleural effusions and moderate abdominal ascites. Omental caking and the left mesenteric mass again localized corresponding to findings on recent PET-CT. Enlarged pericardial and retrocrural lymph nodes again noted. Because of the re- accumulation of ascites, paracentesis was performed previous deep core biopsy removing 2 L of chylous fluid. CT guided core biopsy of the left mesenteric mass was then performed without complication. IMPRESSION: 1. Paracentesis with CT guidance removing 2 L, sample sent for cytology analysis. 2. Technically successful CT-guided core biopsy of left mesenteric mass. Electronically Signed   By: Lucrezia Europe M.D.   On: 04/01/2017 12:15     Assessment and plan- Patient is a 66 y.o. male with Stage IV DLBCL  1. Discussed PET/CT scan as well as pathology results with the patient in detail. He has evidence of adenopathy both above and below diaphragm as well as extra lymphatic  organ involvement in the form of omental caking and peritoneal spread of disease and with stage IV lymphoma. I will proceed with bone marrow biopsy to complete his staging workup although this would not change his management  2. His IPI score is 2- age >55, [place] stage IV. He will fall in the low intermediate group which translates to 5 yr OS of 51% and CR rate of 67%. His LDH and albumin are normal and he does not meet  criteria for CNS prophylaxis based on CNS IPI criteria  3. IHC for cmyc was negative and he likely does not have double hit DLBCL. FISH testing pending  4. I would recommend 6 cycles of RCHOP given stage IV lymphoma Q3 weeks IV X 6 cycles. Discussed with and benefits of our CHOP chemotherapy including all but not limited to nausea, vomiting, fatigue, low blood counts, risk of infections. Risk of peripheral neuropathy associated with vincristine and cardiotoxicity associated with anthracycline. His baseline HIV and hepatitis B and hepatitis C testing was negative. Echocardiogram in April 2018 was normal. However I will get a baseline MUGA scan prior to starting chemotherapy. Chemotherapy would be given with curative intent. I will obtain intent PET CT scan after 3 cycles of chemotherapy  I will tentatively plan to start treatment in 1 week's time and will get repeat cbc, cmp on the day of chemo. Port and bone marrow biopsy prior   Total face to face encounter time for this patient visit was 45 min. >50% of the time was  spent in counseling and coordination of care.    Visit Diagnosis 1. 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 ([place])   2. Goals of care, counseling/discussion      Dr. Randa Evens, MD, MPH [facility] at [facility] Pager- [phone] 04/08/2017 4:35 PM

## 2017-04-08 NOTE — Progress Notes (Signed)
START ON PATHWAY REGIMEN - Lymphoma and CLL     A cycle is every 21 days:     Rituximab      Cyclophosphamide      Doxorubicin      Vincristine      Prednisone   **Always confirm dose/schedule in your pharmacy ordering system**    Patient Characteristics: Diffuse Large B Cell Lymphoma, First Line, Stage III and IV Disease Type: Not Applicable Disease Type: Diffuse Large B Cell Line of therapy: First Line [place] Stage: IV Intent of Therapy: Curative Intent, Discussed with Patient 

## 2017-04-08 NOTE — Progress Notes (Signed)
Here for follow up appt  

## 2017-04-09 ENCOUNTER — Other Ambulatory Visit: Payer: Self-pay | Admitting: Radiology

## 2017-04-10 LAB — SURGICAL PATHOLOGY

## 2017-04-11 ENCOUNTER — Other Ambulatory Visit (HOSPITAL_COMMUNITY)
Admission: RE | Admit: 2017-04-11 | Discharge: 2017-04-11 | Disposition: A | Discharge status: Home / Self Care | Payer: BLUE CROSS/BLUE SHIELD | Source: Ambulatory Visit | Attending: Oncology | Admitting: Oncology

## 2017-04-11 ENCOUNTER — Encounter: Payer: Self-pay | Admitting: Oncology

## 2017-04-11 ENCOUNTER — Ambulatory Visit
Admission: RE | Admit: 2017-04-11 | Discharge: 2017-04-11 | Disposition: A | Discharge status: Home / Self Care | Payer: BLUE CROSS/BLUE SHIELD | Source: Ambulatory Visit | Attending: Oncology | Admitting: Oncology

## 2017-04-11 DIAGNOSIS — L409 Psoriasis, unspecified: Secondary | ICD-10-CM | POA: Diagnosis not present

## 2017-04-11 DIAGNOSIS — M109 Gout, unspecified: Secondary | ICD-10-CM | POA: Diagnosis not present

## 2017-04-11 DIAGNOSIS — Z833 Family history of diabetes mellitus: Secondary | ICD-10-CM | POA: Diagnosis not present

## 2017-04-11 DIAGNOSIS — K219 Gastro-esophageal reflux disease without esophagitis: Secondary | ICD-10-CM | POA: Diagnosis not present

## 2017-04-11 DIAGNOSIS — I10 Essential (primary) hypertension: Secondary | ICD-10-CM | POA: Diagnosis not present

## 2017-04-11 DIAGNOSIS — C8333 Diffuse large B-cell lymphoma, intra-abdominal lymph nodes: Secondary | ICD-10-CM | POA: Insufficient documentation

## 2017-04-11 DIAGNOSIS — N400 Enlarged prostate without lower urinary tract symptoms: Secondary | ICD-10-CM | POA: Diagnosis not present

## 2017-04-11 DIAGNOSIS — E785 Hyperlipidemia, unspecified: Secondary | ICD-10-CM | POA: Diagnosis not present

## 2017-04-11 DIAGNOSIS — Z87891 Personal history of nicotine dependence: Secondary | ICD-10-CM | POA: Insufficient documentation

## 2017-04-11 DIAGNOSIS — Z888 Allergy status to other drugs, medicaments and biological substances status: Secondary | ICD-10-CM | POA: Insufficient documentation

## 2017-04-11 DIAGNOSIS — Z79899 Other long term (current) drug therapy: Secondary | ICD-10-CM | POA: Insufficient documentation

## 2017-04-11 DIAGNOSIS — K7460 Unspecified cirrhosis of liver: Secondary | ICD-10-CM | POA: Diagnosis not present

## 2017-04-11 DIAGNOSIS — Z8049 Family history of malignant neoplasm of other genital organs: Secondary | ICD-10-CM | POA: Insufficient documentation

## 2017-04-11 DIAGNOSIS — Z7982 Long term (current) use of aspirin: Secondary | ICD-10-CM | POA: Insufficient documentation

## 2017-04-11 HISTORY — PX: IR FLUORO GUIDE PORT INSERTION RIGHT: IMG5741

## 2017-04-11 LAB — CBC WITH DIFFERENTIAL/PLATELET
BASOS ABS: 0.1 10*3/uL (ref 0–0.1)
Basophils Relative: 1 %
EOS PCT: 2 %
Eosinophils Absolute: 0.1 10*3/uL (ref 0–0.7)
HCT: 41.6 % (ref 40.0–52.0)
HEMOGLOBIN: 14.3 g/dL (ref 13.0–18.0)
LYMPHS ABS: 0.9 10*3/uL — AB (ref 1.0–3.6)
LYMPHS PCT: 10 %
MCH: 28.9 pg (ref 26.0–34.0)
MCHC: 34.3 g/dL (ref 32.0–36.0)
MCV: 84 fL (ref 80.0–100.0)
Monocytes Absolute: 0.8 10*3/uL (ref 0.2–1.0)
Monocytes Relative: 9 %
NEUTROS PCT: 78 %
Neutro Abs: 6.5 10*3/uL (ref 1.4–6.5)
PLATELETS: 256 10*3/uL (ref 150–440)
RBC: 4.95 MIL/uL (ref 4.40–5.90)
RDW: 16.5 % — ABNORMAL HIGH (ref 11.5–14.5)
WBC: 8.3 10*3/uL (ref 3.8–10.6)

## 2017-04-11 LAB — GLUCOSE, CAPILLARY: GLUCOSE-CAPILLARY: 95 mg/dL (ref 65–99)

## 2017-04-11 LAB — PROTIME-INR
INR: 0.97
PROTHROMBIN TIME: 12.9 s (ref 11.4–15.2)

## 2017-04-11 LAB — APTT: APTT: 32 s (ref 24–36)

## 2017-04-11 MED ORDER — SODIUM CHLORIDE 0.9 % IV SOLN
INTRAVENOUS | Status: DC
  Administered 2017-04-11: 50 mL/h via INTRAVENOUS

## 2017-04-11 MED ORDER — CEFAZOLIN SODIUM-DEXTROSE 2-4 GM/100ML-% IV SOLN
2.0000 g | Freq: Once | INTRAVENOUS | Status: AC
  Administered 2017-04-11: 2 g via INTRAVENOUS
  Filled 2017-04-11: qty 100

## 2017-04-11 MED ORDER — FENTANYL CITRATE (PF) 100 MCG/2ML IJ SOLN
INTRAMUSCULAR | Status: AC
  Filled 2017-04-11: qty 4

## 2017-04-11 MED ORDER — LIDOCAINE HCL (PF) 1 % IJ SOLN
INTRAMUSCULAR | Status: AC
  Filled 2017-04-11: qty 30

## 2017-04-11 MED ORDER — HEPARIN (PORCINE) IN NACL 2-0.9 UNIT/ML-% IJ SOLN
INTRAMUSCULAR | Status: AC
  Filled 2017-04-11: qty 500

## 2017-04-11 MED ORDER — MIDAZOLAM HCL 5 MG/5ML IJ SOLN
INTRAMUSCULAR | Status: AC | PRN
  Administered 2017-04-11: 1 mg via INTRAVENOUS

## 2017-04-11 MED ORDER — HEPARIN SOD (PORK) LOCK FLUSH 100 UNIT/ML IV SOLN
INTRAVENOUS | Status: AC
  Filled 2017-04-11: qty 5

## 2017-04-11 MED ORDER — MIDAZOLAM HCL 2 MG/2ML IJ SOLN
INTRAMUSCULAR | Status: AC
  Filled 2017-04-11: qty 4

## 2017-04-11 MED ORDER — FENTANYL CITRATE (PF) 100 MCG/2ML IJ SOLN
INTRAMUSCULAR | Status: AC | PRN
  Administered 2017-04-11 (×2): 50 ug via INTRAVENOUS

## 2017-04-11 MED ORDER — CEFAZOLIN (ANCEF) 1 G IV SOLR: 2.0000 g | Freq: Once | INTRAVENOUS | Status: DC

## 2017-04-11 MED ORDER — MIDAZOLAM HCL 2 MG/2ML IJ SOLN
INTRAMUSCULAR | Status: AC | PRN
  Administered 2017-04-11: 1 mg via INTRAVENOUS

## 2017-04-11 MED ORDER — MIDAZOLAM HCL 2 MG/2ML IJ SOLN
INTRAMUSCULAR | Status: AC | PRN
  Administered 2017-04-11 (×2): 1 mg via INTRAVENOUS

## 2017-04-11 NOTE — Procedures (Signed)
Interventional Radiology Procedure Note  Procedure: CT guided aspirate and core biopsy of right iliac bone Complications: None Recommendations: - Bedrest supine x 1 hrs - Follow biopsy results  Derel Mcglasson T. Layken Beg, M.D Pager:  [phone]   

## 2017-04-11 NOTE — Procedures (Signed)
Interventional Radiology Procedure Note  Procedure: Placement of a right IJ approach single lumen PowerPort.  Tip is positioned at the superior cavoatrial junction and catheter is ready for immediate use.  Complications: No immediate Recommendations:  - Ok to shower tomorrow - Do not submerge for 7 days - Routine line care   Azim Gillingham T. Mindee Robledo, M.D Pager:  [phone]   

## 2017-04-11 NOTE — H&P (Signed)
Chief Complaint: Large B-cell lymphoma  Referring Physician(s): Rao,Archana C  Supervising Physician: Aletta Edouard  Patient Status: [facility] - Out-pt  History of Present Illness: Peter Ryan is a 66 y.o. male who is known to our service.  He recently underwent a biopsy by Dr. Vernard Gambles. He has also had thoracenteses and paracentesis by our service.  He has been diagnosed with Large B-cell lymphoma.  He did have an ER visit for significant bruising after procedure, but workup was negative.  He is here today for bone marrow biopsy and Port A Cath placemement.  He is NPO. He does not take blood thinners.  Past Medical History:  Diagnosis Date  . Arthritis   . BPH (benign prostatic hyperplasia)   . Chronic prostatitis   . Cirrhosis of liver ([place])   . Dyslipidemia   . Elevated PSA   . Gastric reflux   . Gout   . HTN (hypertension)   . Over weight   . Psoriasis   . Testicular pain, right   . Urinary frequency     Past Surgical History:  Procedure Laterality Date  . hydrocelectomy    . PILONIDAL CYST EXCISION      Allergies: Metformin  Medications: Prior to Admission medications   Medication Sig Start Date End Date Taking? Authorizing Provider  allopurinol (ZYLOPRIM) 300 MG tablet TAKE ONE TABLET EVERY DAY 02/18/17   [name]., MD  allopurinol (ZYLOPRIM) 300 MG tablet Take 1 tablet (300 mg total) by mouth daily. 04/08/17   Sindy Guadeloupe, MD  amLODipine (NORVASC) 10 MG tablet TAKE ONE TABLET EVERY DAY 12/23/16   [name]., MD  aspirin EC 81 MG tablet Take 81 mg by mouth daily.     [provider]  Cholecalciferol (VITAMIN D3) 1000 UNITS CAPS Take 1,000 Units by mouth daily.     [provider]  finasteride (PROSCAR) 5 MG tablet Take 1 tablet (5 mg total) by mouth daily. 07/18/16   Zara Council A, PA-C  furosemide (LASIX) 20 MG tablet Take 1 tablet (20 mg total) by mouth daily. Patient not taking: Reported on  03/24/2017 02/08/17   Theodoro Grist, MD  lidocaine-prilocaine (EMLA) cream Apply to affected area once 04/08/17   Sindy Guadeloupe, MD  LORazepam (ATIVAN) 0.5 MG tablet Take 1 tablet (0.5 mg total) by mouth every 6 (six) hours as needed (Nausea or vomiting). 04/08/17   Sindy Guadeloupe, MD  losartan (COZAAR) 50 MG tablet Take 1 tablet (50 mg total) by mouth daily. 12/02/16   [name]., MD  ondansetron (ZOFRAN) 8 MG tablet Take 1 tablet (8 mg total) by mouth 2 (two) times daily as needed for refractory nausea / vomiting. Start on day 3 after cyclophosphamide chemotherapy. 04/08/17   Sindy Guadeloupe, MD  predniSONE (DELTASONE) 20 MG tablet Take 5 tablets (100 mg total) by mouth daily. Take on days 1-5 of chemotherapy. 04/08/17   Sindy Guadeloupe, MD  prochlorperazine (COMPAZINE) 10 MG tablet Take 1 tablet (10 mg total) by mouth every 6 (six) hours as needed (Nausea or vomiting). 04/08/17   Sindy Guadeloupe, MD  spironolactone (ALDACTONE) 50 MG tablet Take 1 tablet (50 mg total) by mouth 2 (two) times daily. Patient not taking: Reported on 04/08/2017 02/08/17   Theodoro Grist, MD     Family History  Problem Relation Age of Onset  . Uterine cancer Mother   . Diabetes Mother   . Cancer Mother   .  Cancer Maternal Uncle   . Bladder Cancer Neg Hx   . Kidney disease Neg Hx   . Prostate cancer Neg Hx     Social History   Social History  . Marital status: Married    Spouse name: N/A  . Number of children: N/A  . Years of education: N/A   Social History Main Topics  . Smoking status: Former Smoker    Packs/day: 1.50    Years: 8.00    Types: Cigarettes  . Smokeless tobacco: Never Used     Comment: quit 40 years ago  . Alcohol use No     Comment: rarely  . Drug use: No  . Sexual activity: Not Asked   Other Topics Concern  . None   Social History Narrative  . None    Review of Systems: A 12 point ROS discussed  Review of Systems  Constitutional: Negative.   HENT: Negative.     Respiratory: Negative.   Cardiovascular: Negative.   Gastrointestinal: Positive for abdominal distention and abdominal pain.  Genitourinary: Negative.   Musculoskeletal: Negative.   Skin: Positive for color change.       Bruising after biopsy = resolving  Neurological: Negative.   Hematological: Negative.   Psychiatric/Behavioral: Negative.     Vital Signs: BP (!) 147/93   Pulse 84   Temp 98 F (36.7 C) (Oral)   Resp 18   SpO2 94%   Physical Exam  Constitutional: He is oriented to person, place, and time. He appears well-developed.  HENT:  Head: Normocephalic and atraumatic.  Eyes: EOM are normal.  Neck: Normal range of motion.  Cardiovascular: Normal rate, regular rhythm and normal heart sounds.   Pulmonary/Chest: Effort normal. No respiratory distress. He has no wheezes.  Diminshed breath sounds right base secondary to pleural effusion.  Abdominal: Soft. He exhibits distension. There is tenderness.  Bruising after biopsy = resolving  Musculoskeletal: Normal range of motion.  Neurological: He is alert and oriented to person, place, and time.  Skin: Skin is warm and dry.  Psychiatric: He has a normal mood and affect. His behavior is normal. Judgment and thought content normal.  Vitals reviewed.   Mallampati Score:  MD Evaluation Airway: WNL Heart: WNL Abdomen: Other (comments) Abdomen comments: Distension secondary to ascites Chest/ Lungs:  (Diminshed right base secondary to pleural effusion) ASA  Classification: 3 Mallampati/Airway Score: One  Imaging: Ct Abdomen Pelvis W Wo Contrast  Result Date: 03/21/2017 CLINICAL DATA:  Cirrhosis with ascites. EXAM: CT ABDOMEN AND PELVIS WITHOUT AND WITH CONTRAST TECHNIQUE: Multidetector CT imaging of the abdomen and pelvis was performed following the standard protocol before and following the bolus administration of intravenous contrast. CONTRAST:  125 cc Isovue 370 COMPARISON:  None. FINDINGS: Lower chest: Dependent  atelectasis noted right lower lobe with moderate right pleural effusion and small left pleural effusion. Enlarged anterior right juxta diaphragmatic lymph nodes are evident, measuring up to 2.1 cm short axis on the right. Left anterior juxta diaphragmatic or lower mediastinal lymph node measures 1.8 cm short axis. Hepatobiliary: Nodular liver contour with subtle heterogeneity of the liver parenchyma is in keeping with the reported clinical history of cirrhosis. No focal mass lesion identified within the liver parenchyma. There is no evidence for gallstones, gallbladder wall thickening, or pericholecystic fluid. No intrahepatic or extrahepatic biliary dilation. Pancreas: No focal mass lesion. No dilatation of the main duct. No intraparenchymal cyst. No peripancreatic edema. Spleen: No splenomegaly. No focal mass lesion. Adrenals/Urinary Tract: No adrenal nodule or  mass. Small low-density lesions in the inferior right kidney cannot be fully characterized. 5.6 cm low-density lesion in the lower pole the right kidney approaches water attenuation and is compatible with a cyst. No evidence for hydroureter. The urinary bladder appears normal for the degree of distention. Stomach/Bowel: Stomach is nondistended. No gastric wall thickening. No evidence of outlet obstruction. Duodenum is normally positioned as is the ligament of Treitz. Visualized small bowel loops are nondilated. Diverticular changes are noted in the left colon without evidence of diverticulitis. Vascular/Lymphatic: No abdominal aortic aneurysm. Small lymph nodes are identified in the gastrohepatic ligament. 14 mm short axis retrocaval lymph node is seen on image 38. 15 mm short axis right retrocrural lymph node is visible on image 30. 7.3 x 13.5 cm mass is identified in the central small bowel mesentery, circumferentially encasing the superior mesenteric artery and vein. Diffuse omental caking is associated. Reproductive: The prostate gland and seminal  vesicles have normal imaging features. Other: Small to moderate volume ascites. Peritoneal enhancement identified in the pelvis raises concern for peritoneal carcinomatosis. Musculoskeletal: Bone windows reveal no worrisome lytic or sclerotic osseous lesions. IMPRESSION: 1. Bulky central mesenteric mass lesion encasing major mesenteric vascular anatomy without substantial mass-effect. Diffuse omental caking is evident and peritoneal enhancement is identified. There is associated mesenteric, juxta diaphragmatic, retroperitoneal, and retrocrural lymphadenopathy. Lymphoma would be a consideration. Metastatic disease could also have this appearance. 2. Small a moderate volume ascites Electronically Signed   By: Kennith Center M.D.   On: 03/21/2017 11:07   Ct Abdomen Pelvis W Contrast  Result Date: 04/04/2017 CLINICAL DATA:  Tightness of the abdomen with erythema and purplish color. EXAM: CT ABDOMEN AND PELVIS WITH CONTRAST TECHNIQUE: Multidetector CT imaging of the abdomen and pelvis was performed using the standard protocol following bolus administration of intravenous contrast. CONTRAST:  ISOVUE-300 IOPAMIDOL (ISOVUE-300) INJECTION 61% COMPARISON:  03/21/2017 CT and CT-guided paracentesis images 04/01/2017. FINDINGS: Lower chest: Normal size cardiac chambers. No pericardial effusion. Moderate to large right and small to moderate left pleural effusions with compressive atelectasis. Hepatobiliary: Nodular liver surface consistent with cirrhosis. Tiny mm hypodensity in the left hepatic lobe is unchanged but too small further characterize. No biliary dilatation is noted. The gallbladder is physiologically distended in appearance without wall thickening. Probable biliary sludge accounting for faint hyperdensities noted within. Pancreas: No mass or ductal dilatation. Spleen: Normal in size without focal abnormality. Adrenals/Urinary Tract: No adrenal mass. Stable 5.6 x 3.9 cm cyst arising from the anterior aspect of  the left lower pole of the kidney, unchanged in appearance, series 2, image 36. No hydroureter. Urinary bladder is unremarkable for degree of distention. Stomach/Bowel: Contrast filled nondistended stomach with normal small bowel rotation. Contrast reaches the cecum. There is stool within much of the large intestine without obstruction noted. Scattered colonic diverticulosis along sigmoid colon is identified without diverticulitis. Vascular/Lymphatic: No abdominal aortic aneurysm. Minimally larger right retrocrural lymph node at 18 mm short axis versus 15 mm previously. 12 mm retrocaval lymph node is stable versus 14 mm previously. Enlarged anterior juxtadiaphragmatic lymph nodes are again noted measuring 2.4 and 1.9 cm versus 2.1 and 1.8 cm previously. Once again there is a central mesenteric mass encasing portions of the SMA measuring 13.9 x 7.6 cm versus 13.5 x 7.3 cm at the same level previously. Diffuse omental caking is seen. Reproductive: Top-normal size prostate gland with peripheral zone calcifications. Other: Small to moderate amount of ascites is again noted. Peritoneal enhancement suggestive carcinomatosis. Musculoskeletal: No worrisome lytic or  sclerotic osseous lesions. IMPRESSION: 1. Bilateral pleural effusions and ascites similar to prior exam. 2. Slightly larger right retrocrural and juxta diaphragmatic lymph nodes since prior exam with stable central mesenteric mass which partially encases the SMA, estimated at 13.9 x 7.6 cm versus 13.5 x 7.3 cm at the same level. Electronically Signed   By: Ashley Royalty M.D.   On: 04/04/2017 22:41   Nm Pet Image Initial (pi) Skull Base To Thigh  Result Date: 03/28/2017 CLINICAL DATA:  Initial treatment strategy for central mesenteric mass with omental caking and peritoneal enhancement on reason imaging. EXAM: NUCLEAR MEDICINE PET SKULL BASE TO THIGH TECHNIQUE: Multiple exams, including 12.7 mCi F-18 FDG was injected intravenously. Full-ring PET imaging was  performed from the skull base to thigh after the radiotracer. CT data was obtained and used for attenuation correction and anatomic localization. FASTING BLOOD GLUCOSE:  Value: 106 mg/dl COMPARISON:  Multiple exams, including 03/21/2017 FINDINGS: NECK No hypermetabolic lymph nodes in the neck. CHEST Hypermetabolic 1.4 cm prevascular lymph node on image 93/ 3 has maximum standard uptake value 8.3. Left upper internal mammary lymph node measuring 0.6 cm in short axis on image 93/ 3 has maximum standard uptake value 5.4. A left lower paraesophageal lymph node measuring 0.8 cm in short axis on image 119/ 3 has maximum SUV 6.6. There multiple large hypermetabolic pericardial lymph nodes. An index lesion eccentric to the right has a short axis diameter of 2.1 cm and maximum SUV 4.9. Moderate large bilateral pleural effusions with passive atelectasis. Very low-grade activity along the pleural fluid. ABDOMEN/PELVIS The large left eccentric central mesenteric mass has a maximum SUV of 23.4. There is diffuse peritoneal tumor deposition with omental caking. A representative lower abdominal region of omental caking has a maximum SUV of 8.5 in the midline. Nodular areas of hypermetabolic activity along the moderate ascites. No definite metastatic lesions in the liver, spleen, pancreas, or kidneys. Left kidney lower pole cyst noted. Right retrocrural node measuring 1.3 cm in short axis on image 152/3 has a maximum SUV of 13.9. Aortoiliac atherosclerotic vascular disease. Sigmoid colon diverticulosis. Moderately enlarged prostate gland. SKELETON No focal hypermetabolic activity to suggest skeletal metastasis. IMPRESSION: 1. Large hypermetabolic left eccentric mesenteric mass with extensive omental caking and peritoneal spread of tumor. This mechanism of spread would seem to favor a gastrointestinal primary, with entities such as carcinoid tumor or lymphoma as differential diagnostic considerations. Tissue diagnosis recommended. 2.  Hypermetabolic mediastinal adenopathy compatible with malignancy. Moderate to large bilateral pleural effusions with passive atelectasis and low-grade activity along the pleural fluid-early malignant pleural effusions not excluded. 3.  Aortic Atherosclerosis (ICD10-I70.0). 4. No definite involvement of the neck or skeleton. 5. Moderately enlarged prostate gland. Electronically Signed   By: Van Clines M.D.   On: 03/28/2017 12:32   Ct Biopsy  Result Date: 04/01/2017 CLINICAL DATA:  Left mesenteric mass with omental caking, pleural effusions, and ascites. Previous pleural and peritoneal cytology negative. EXAM: CT GUIDED PARACENTESIS CT GUIDED CORE BIOPSY OF LEFT MESENTERIC MASS ANESTHESIA/SEDATION: Intravenous Fentanyl and Versed were administered as conscious sedation during continuous monitoring of the patient's level of consciousness and physiological / cardiorespiratory status by the radiology RN, with a total moderate sedation time of 23 minutes. PROCEDURE: The procedure risks, benefits, and alternatives were explained to the patient. Questions regarding the procedure were encouraged and answered. The patient understands and consents to the procedure. Survey ultrasound of the abdomen was performed. Moderate ascites was identified. An appropriate skin entry site was determined and  marked. Skin was prepped with chlorhexidine and infiltrated locally with 1% lidocaine. A Safe-T-Centesis needle was advanced into the peritoneal space. Approximately 2 L of chylous fluid were removed, sample sent for cytology analysis. Subsequently, an appropriate skin entry site for biopsy was determined and marked. The operative field was prepped with chlorhexidinein a sterile fashion, and a sterile drape was applied covering the operative field. A sterile gown and sterile gloves were used for the procedure. Local anesthesia was provided with 1% Lidocaine. Under CT fluoroscopic guidance, a 17 gauge trocar needle was advanced  to the margin of the left mesenteric lesion. Once needle tip position was confirmed, coaxial 18-gauge core biopsy samples were obtained, submitted in saline to surgical pathology. The guide needle was removed. The patient tolerated the procedure well. COMPLICATIONS: None immediate FINDINGS: Initial CT demonstrated moderate bilateral pleural effusions and moderate abdominal ascites. Omental caking and the left mesenteric mass again localized corresponding to findings on recent PET-CT. Enlarged pericardial and retrocrural lymph nodes again noted. Because of the re- accumulation of ascites, paracentesis was performed previous deep core biopsy removing 2 L of chylous fluid. CT guided core biopsy of the left mesenteric mass was then performed without complication. IMPRESSION: 1. Paracentesis with CT guidance removing 2 L, sample sent for cytology analysis. 2. Technically successful CT-guided core biopsy of left mesenteric mass. Electronically Signed   By: Lucrezia Europe M.D.   On: 04/01/2017 12:15   Ct Image Guided Drainage Percut Cath  Peritoneal Retroperit  Result Date: 04/01/2017 CLINICAL DATA:  Left mesenteric mass with omental caking, pleural effusions, and ascites. Previous pleural and peritoneal cytology negative. EXAM: CT GUIDED PARACENTESIS CT GUIDED CORE BIOPSY OF LEFT MESENTERIC MASS ANESTHESIA/SEDATION: Intravenous Fentanyl and Versed were administered as conscious sedation during continuous monitoring of the patient's level of consciousness and physiological / cardiorespiratory status by the radiology RN, with a total moderate sedation time of 23 minutes. PROCEDURE: The procedure risks, benefits, and alternatives were explained to the patient. Questions regarding the procedure were encouraged and answered. The patient understands and consents to the procedure. Survey ultrasound of the abdomen was performed. Moderate ascites was identified. An appropriate skin entry site was determined and marked. Skin was  prepped with chlorhexidine and infiltrated locally with 1% lidocaine. A Safe-T-Centesis needle was advanced into the peritoneal space. Approximately 2 L of chylous fluid were removed, sample sent for cytology analysis. Subsequently, an appropriate skin entry site for biopsy was determined and marked. The operative field was prepped with chlorhexidinein a sterile fashion, and a sterile drape was applied covering the operative field. A sterile gown and sterile gloves were used for the procedure. Local anesthesia was provided with 1% Lidocaine. Under CT fluoroscopic guidance, a 17 gauge trocar needle was advanced to the margin of the left mesenteric lesion. Once needle tip position was confirmed, coaxial 18-gauge core biopsy samples were obtained, submitted in saline to surgical pathology. The guide needle was removed. The patient tolerated the procedure well. COMPLICATIONS: None immediate FINDINGS: Initial CT demonstrated moderate bilateral pleural effusions and moderate abdominal ascites. Omental caking and the left mesenteric mass again localized corresponding to findings on recent PET-CT. Enlarged pericardial and retrocrural lymph nodes again noted. Because of the re- accumulation of ascites, paracentesis was performed previous deep core biopsy removing 2 L of chylous fluid. CT guided core biopsy of the left mesenteric mass was then performed without complication. IMPRESSION: 1. Paracentesis with CT guidance removing 2 L, sample sent for cytology analysis. 2. Technically successful CT-guided core biopsy  of left mesenteric mass. Electronically Signed   By: Lucrezia Europe M.D.   On: 04/01/2017 12:15    Labs:  CBC:  Recent Labs  02/08/17 0437 03/06/17 1606 03/24/17 1623 04/04/17 2028  WBC 8.5 9.6 9.3 9.6  HGB 15.6 15.4 14.1 13.8  HCT 46.6 45.1 42.0 40.5  PLT 247 260 234 264    COAGS:  Recent Labs  01/29/17 0915 03/06/17 1606 04/01/17 0929 04/04/17 2028  INR 0.93 0.99 0.98 0.98     BMP:  Recent Labs  03/06/17 1606 03/21/17 0909 03/21/17 1024 03/24/17 1623 04/04/17 2028  NA 134*  --  137 134* 138  K 4.4  --  4.1 3.8 3.6  CL 103  --  105 102 104  CO2 21*  --  '26 24 26  '$ GLUCOSE 99  --  107* 100* 126*  BUN 47*  --  '16 14 18  '$ CALCIUM 9.6  --  8.6* 8.7* 8.6*  CREATININE 1.64* 1.00 1.10 0.99 1.02  GFRNONAA 43*  --  >60 >60 >60  GFRAA 50*  --  >60 >60 >60    LIVER FUNCTION TESTS:  Recent Labs  02/07/17 0951 03/06/17 1606 03/24/17 1623 04/04/17 2028  BILITOT 0.7 0.7 0.6 0.5  AST 17 14* 15 19  ALT 15* 16* 14* 12*  ALKPHOS 58 53 53 49  PROT 7.4 8.0 7.1 6.8  ALBUMIN 4.0 4.3 3.8 3.5    TUMOR MARKERS: No results for input(s): AFPTM, CEA, CA199, CHROMGRNA in the last 8760 hours.  Assessment and Plan:  Large B-cell lymphoma  Will proceed with bone marrow biopsy and placement of Port A Cath today by Dr. Kathlene Cote.  Risks and Benefits discussed with the patient including, but not limited to bleeding, infection, pneumothorax, or fibrin sheath development and need for additional procedures.  Risks and Benefits discussed with the patient including, but not limited to bleeding, infection, damage to adjacent structures or low yield requiring additional tests.  All of the patient's questions were answered, patient is agreeable to proceed. Consent signed and in chart.  Electronically Signed: Murrell Redden, PA-C 04/11/2017, 9:14 AM   I spent a total of   25 Minutes in face to face in clinical consultation, greater than 50% of which was counseling/coordinating care for port a cath and bone marrow bx.

## 2017-04-11 NOTE — Discharge Instructions (Signed)
Bone Marrow Aspiration and Bone Marrow Biopsy, Adult, Care After This sheet gives you information about how to care for yourself after your procedure. Your health care provider may also give you more specific instructions. If you have problems or questions, contact your health care provider. What can I expect after the procedure? After the procedure, it is common to have:  Mild pain and tenderness.  Swelling.  Bruising.  Follow these instructions at home:  Take over-the-counter or prescription medicines only as told by your health care provider.  Do not take baths, swim, or use a hot tub until your health care provider approves. Ask if you can take a shower or have a sponge bath.  Follow instructions from your health care provider about how to take care of the puncture site. Make sure you: ? Wash your hands with soap and water before you change your bandage (dressing). If soap and water are not available, use hand sanitizer. ? Change your dressing as told by your health care provider.  Check your puncture siteevery day for signs of infection. Check for: ? More redness, swelling, or pain. ? More fluid or blood. ? Warmth. ? Pus or a bad smell.  Return to your normal activities as told by your health care provider. Ask your health care provider what activities are safe for you.  Do not drive for 24 hours if you were given a medicine to help you relax (sedative).  Keep all follow-up visits as told by your health care provider. This is important. Contact a health care provider if:  You have more redness, swelling, or pain around the puncture site.  You have more fluid or blood coming from the puncture site.  Your puncture site feels warm to the touch.  You have pus or a bad smell coming from the puncture site.  You have a fever.  Your pain is not controlled with medicine. This information is not intended to replace advice given to you by your health care provider. Make sure  you discuss any questions you have with your health care provider. Document Released: 04/05/2005 Document Revised: 04/05/2016 Document Reviewed: 02/28/2016 Elsevier Interactive Patient Education  2018 [place] Insertion, Care After This sheet gives you information about how to care for yourself after your procedure. Your health care provider may also give you more specific instructions. If you have problems or questions, contact your health care provider. What can I expect after the procedure? After your procedure, it is common to have:  Discomfort at the port insertion site.  Bruising on the skin over the port. This should improve over 3-4 days.  Follow these instructions at home: [facility] care  After your port is placed, you will get a manufacturer's information card. The card has information about your port. Keep this card with you at all times.  Take care of the port as told by your health care provider. Ask your health care provider if you or a family member can get training for taking care of the port at home. A home health care nurse may also take care of the port.  Make sure to remember what type of port you have. Incision care  Follow instructions from your health care provider about how to take care of your port insertion site. Make sure you: ? Wash your hands with soap and water before you change your bandage (dressing). If soap and water are not available, use hand sanitizer. ? Change your dressing as told by  your health care provider. ? Leave stitches (sutures), skin glue, or adhesive strips in place. These skin closures may need to stay in place for 2 weeks or longer. If adhesive strip edges start to loosen and curl up, you may trim the loose edges. Do not remove adhesive strips completely unless your health care provider tells you to do that.  Check your port insertion site every day for signs of infection. Check for: ? More redness, swelling, or  pain. ? More fluid or blood. ? Warmth. ? Pus or a bad smell. General instructions  Do not take baths, swim, or use a hot tub until your health care provider approves.  Do not lift anything that is heavier than 10 lb (4.5 kg) for a week, or as told by your health care provider.  Ask your health care provider when it is okay to: ? Return to work or school. ? Resume usual physical activities or sports.  Do not drive for 24 hours if you were given a medicine to help you relax (sedative).  Take over-the-counter and prescription medicines only as told by your health care provider.  Wear a medical alert bracelet in case of an emergency. This will tell any health care providers that you have a port.  Keep all follow-up visits as told by your health care provider. This is important. Contact a health care provider if:  You cannot flush your port with saline as directed, or you cannot draw blood from the port.  You have a fever or chills.  You have more redness, swelling, or pain around your port insertion site.  You have more fluid or blood coming from your port insertion site.  Your port insertion site feels warm to the touch.  You have pus or a bad smell coming from the port insertion site. Get help right away if:  You have chest pain or shortness of breath.  You have bleeding from your port that you cannot control. Summary  Take care of the port as told by your health care provider.  Change your dressing as told by your health care provider.  Keep all follow-up visits as told by your health care provider. This information is not intended to replace advice given to you by your health care provider. Make sure you discuss any questions you have with your health care provider. Document Released: 07/07/2013 Document Revised: 08/07/2016 Document Reviewed: 08/07/2016 Elsevier Interactive Patient Education  2017 [employer].

## 2017-04-14 ENCOUNTER — Ambulatory Visit: Payer: BLUE CROSS/BLUE SHIELD | Admitting: Gastroenterology

## 2017-04-14 NOTE — Patient Instructions (Signed)
Rituximab injection What is this medicine? RITUXIMAB (ri TUX i mab) is a monoclonal antibody. It is used to treat certain types of cancer like non-Hodgkin lymphoma and chronic lymphocytic leukemia. It is also used to treat rheumatoid arthritis, granulomatosis with polyangiitis (or Wegener's granulomatosis), and microscopic polyangiitis. This medicine may be used for other purposes; ask your health care provider or pharmacist if you have questions. COMMON BRAND NAME(S): Rituxan What should I tell my health care provider before I take this medicine? They need to know if you have any of these conditions: -heart disease -infection (especially a virus infection such as hepatitis B, chickenpox, cold sores, or herpes) -immune system problems -irregular heartbeat -kidney disease -lung or breathing disease, like asthma -recently received or scheduled to receive a vaccine -an unusual or allergic reaction to rituximab, mouse proteins, other medicines, foods, dyes, or preservatives -pregnant or trying to get pregnant -breast-feeding How should I use this medicine? This medicine is for infusion into a vein. It is administered in a hospital or clinic by a specially trained health care professional. A special MedGuide will be given to you by the pharmacist with each prescription and refill. Be sure to read this information carefully each time. Talk to your pediatrician regarding the use of this medicine in children. This medicine is not approved for use in children. Overdosage: If you think you have taken too much of this medicine contact a poison control center or emergency room at once. NOTE: This medicine is only for you. Do not share this medicine with others. What if I miss a dose? It is important not to miss a dose. Call your doctor or health care professional if you are unable to keep an appointment. What may interact with this medicine? -cisplatin -other medicines for arthritis like disease  modifying antirheumatic drugs or tumor necrosis factor inhibitors -live virus vaccines This list may not describe all possible interactions. Give your health care provider a list of all the medicines, herbs, non-prescription drugs, or dietary supplements you use. Also tell them if you smoke, drink alcohol, or use illegal drugs. Some items may interact with your medicine. What should I watch for while using this medicine? Your condition will be monitored carefully while you are receiving this medicine. You may need blood work done while you are taking this medicine. This medicine can cause serious allergic reactions. To reduce your risk you may need to take medicine before treatment with this medicine. Take your medicine as directed. In some patients, this medicine may cause a serious brain infection that may cause death. If you have any problems seeing, thinking, speaking, walking, or standing, tell your doctor right away. If you cannot reach your doctor, urgently seek other source of medical care. Call your doctor or health care professional for advice if you get a fever, chills or sore throat, or other symptoms of a cold or flu. Do not treat yourself. This drug decreases your body's ability to fight infections. Try to avoid being around people who are sick. Do not become pregnant while taking this medicine or for 12 months after stopping it. Women should inform their doctor if they wish to become pregnant or think they might be pregnant. There is a potential for serious side effects to an unborn child. Talk to your health care professional or pharmacist for more information. What side effects may I notice from receiving this medicine? Side effects that you should report to your doctor or health care professional as soon as possible: -breathing   problems -chest pain -dizziness or feeling faint -fast, irregular heartbeat -low blood counts - this medicine may decrease the number of white blood cells,  red blood cells and platelets. You may be at increased risk for infections and bleeding. -mouth sores -redness, blistering, peeling or loosening of the skin, including inside the mouth (this can be added for any serious or exfoliative rash that could lead to hospitalization) -signs of infection - fever or chills, cough, sore throat, pain or difficulty passing urine -signs and symptoms of kidney injury like trouble passing urine or change in the amount of urine -signs and symptoms of liver injury like dark yellow or brown urine; general ill feeling or flu-like symptoms; light-colored stools; loss of appetite; nausea; right upper belly pain; unusually weak or tired; yellowing of the eyes or skin -stomach pain -vomiting Side effects that usually do not require medical attention (report to your doctor or health care professional if they continue or are bothersome): -headache -joint pain -muscle cramps or muscle pain This list may not describe all possible side effects. Call your doctor for medical advice about side effects. You may report side effects to FDA at 1-800-FDA-1088. Where should I keep my medicine? This drug is given in a hospital or clinic and will not be stored at home. NOTE: This sheet is a summary. It may not cover all possible information. If you have questions about this medicine, talk to your doctor, pharmacist, or health care provider.  2018 [employer]/Gold Standard (2016-04-24 15:28:09) Doxorubicin injection What is this medicine? DOXORUBICIN (dox oh ROO bi sin) is a chemotherapy drug. It is used to treat many kinds of cancer like leukemia, lymphoma, neuroblastoma, sarcoma, and Wilms' tumor. It is also used to treat bladder cancer, breast cancer, lung cancer, ovarian cancer, stomach cancer, and thyroid cancer. This medicine may be used for other purposes; ask your health care provider or pharmacist if you have questions. COMMON BRAND NAME(S): Adriamycin, Adriamycin PFS, Adriamycin  RDF, Rubex What should I tell my health care provider before I take this medicine? They need to know if you have any of these conditions: -heart disease -history of low blood counts caused by a medicine -liver disease -recent or ongoing radiation therapy -an unusual or allergic reaction to doxorubicin, other chemotherapy agents, other medicines, foods, dyes, or preservatives -pregnant or trying to get pregnant -breast-feeding How should I use this medicine? This drug is given as an infusion into a vein. It is administered in a hospital or clinic by a specially trained health care professional. If you have pain, swelling, burning or any unusual feeling around the site of your injection, tell your health care professional right away. Talk to your pediatrician regarding the use of this medicine in children. Special care may be needed. Overdosage: If you think you have taken too much of this medicine contact a poison control center or emergency room at once. NOTE: This medicine is only for you. Do not share this medicine with others. What if I miss a dose? It is important not to miss your dose. Call your doctor or health care professional if you are unable to keep an appointment. What may interact with this medicine? This medicine may interact with the following medications: -6-mercaptopurine -paclitaxel -phenytoin -St. John's Wort -trastuzumab -verapamil This list may not describe all possible interactions. Give your health care provider a list of all the medicines, herbs, non-prescription drugs, or dietary supplements you use. Also tell them if you smoke, drink alcohol, or use illegal drugs.   Some items may interact with your medicine. What should I watch for while using this medicine? This drug may make you feel generally unwell. This is not uncommon, as chemotherapy can affect healthy cells as well as cancer cells. Report any side effects. Continue your course of treatment even though you  feel ill unless your doctor tells you to stop. There is a maximum amount of this medicine you should receive throughout your life. The amount depends on the medical condition being treated and your overall health. Your doctor will watch how much of this medicine you receive in your lifetime. Tell your doctor if you have taken this medicine before. You may need blood work done while you are taking this medicine. Your urine may turn red for a few days after your dose. This is not blood. If your urine is dark or brown, call your doctor. In some cases, you may be given additional medicines to help with side effects. Follow all directions for their use. Call your doctor or health care professional for advice if you get a fever, chills or sore throat, or other symptoms of a cold or flu. Do not treat yourself. This drug decreases your body's ability to fight infections. Try to avoid being around people who are sick. This medicine may increase your risk to bruise or bleed. Call your doctor or health care professional if you notice any unusual bleeding. Talk to your doctor about your risk of cancer. You may be more at risk for certain types of cancers if you take this medicine. Do not become pregnant while taking this medicine or for 6 months after stopping it. Women should inform their doctor if they wish to become pregnant or think they might be pregnant. Men should not father a child while taking this medicine and for 6 months after stopping it. There is a potential for serious side effects to an unborn child. Talk to your health care professional or pharmacist for more information. Do not breast-feed an infant while taking this medicine. This medicine has caused ovarian failure in some women and reduced sperm counts in some men This medicine may interfere with the ability to have a child. Talk with your doctor or health care professional if you are concerned about your fertility. What side effects may I notice  from receiving this medicine? Side effects that you should report to your doctor or health care professional as soon as possible: -allergic reactions like skin rash, itching or hives, swelling of the face, lips, or tongue -breathing problems -chest pain -fast or irregular heartbeat -low blood counts - this medicine may decrease the number of white blood cells, red blood cells and platelets. You may be at increased risk for infections and bleeding. -pain, redness, or irritation at site where injected -signs of infection - fever or chills, cough, sore throat, pain or difficulty passing urine -signs of decreased platelets or bleeding - bruising, pinpoint red spots on the skin, black, tarry stools, blood in the urine -swelling of the ankles, feet, hands -tiredness -weakness Side effects that usually do not require medical attention (report to your doctor or health care professional if they continue or are bothersome): -diarrhea -hair loss -mouth sores -nail discoloration or damage -nausea -red colored urine -vomiting This list may not describe all possible side effects. Call your doctor for medical advice about side effects. You may report side effects to FDA at 1-800-FDA-1088. Where should I keep my medicine? This drug is given in a hospital or clinic   and will not be stored at home. NOTE: This sheet is a summary. It may not cover all possible information. If you have questions about this medicine, talk to your doctor, pharmacist, or health care provider.  2018 [employer]/Gold Standard (2015-11-13 11:28:51) Vincristine injection What is this medicine? VINCRISTINE (vin KRIS teen) is a chemotherapy drug. It slows the growth of cancer cells. This medicine is used to treat many types of cancer like Hodgkin's disease, leukemia, non-Hodgkin's lymphoma, neuroblastoma (brain cancer), rhabdomyosarcoma, and Wilms' tumor. This medicine may be used for other purposes; ask your health care provider or  pharmacist if you have questions. COMMON BRAND NAME(S): Oncovin, Vincasar PFS What should I tell my health care provider before I take this medicine? They need to know if you have any of these conditions: -blood disorders -gout -infection (especially chickenpox, cold sores, or herpes) -kidney disease -liver disease -lung disease -nervous system disease like Charcot-Marie-Tooth (CMT) -recent or ongoing radiation therapy -an unusual or allergic reaction to vincristine, other chemotherapy agents, other medicines, foods, dyes, or preservatives -pregnant or trying to get pregnant -breast-feeding How should I use this medicine? This drug is given as an infusion into a vein. It is administered in a hospital or clinic by a specially trained health care professional. If you have pain, swelling, burning, or any unusual feeling around the site of your injection, tell your health care professional right away. Talk to your pediatrician regarding the use of this medicine in children. While this drug may be prescribed for selected conditions, precautions do apply. Overdosage: If you think you have taken too much of this medicine contact a poison control center or emergency room at once. NOTE: This medicine is only for you. Do not share this medicine with others. What if I miss a dose? It is important not to miss your dose. Call your doctor or health care professional if you are unable to keep an appointment. What may interact with this medicine? Do not take this medicine with any of the following medications: -itraconazole -mibefradil -voriconazole This medicine may also interact with the following medications: -cyclosporine -erythromycin -fluconazole -ketoconazole -medicines for HIV like delavirdine, efavirenz, nevirapine -medicines for seizures like ethotoin, fosphenotoin, phenytoin -medicines to increase blood counts like filgrastim, pegfilgrastim, sargramostim -other chemotherapy drugs like  cisplatin, L-asparaginase, methotrexate, mitomycin, paclitaxel -pegaspargase -vaccines -zalcitabine, ddC Talk to your doctor or health care professional before taking any of these medicines: -acetaminophen -aspirin -ibuprofen -ketoprofen -naproxen This list may not describe all possible interactions. Give your health care provider a list of all the medicines, herbs, non-prescription drugs, or dietary supplements you use. Also tell them if you smoke, drink alcohol, or use illegal drugs. Some items may interact with your medicine. What should I watch for while using this medicine? Your condition will be monitored carefully while you are receiving this medicine. You will need important blood work done while you are taking this medicine. This drug may make you feel generally unwell. This is not uncommon, as chemotherapy can affect healthy cells as well as cancer cells. Report any side effects. Continue your course of treatment even though you feel ill unless your doctor tells you to stop. In some cases, you may be given additional medicines to help with side effects. Follow all directions for their use. Call your doctor or health care professional for advice if you get a fever, chills or sore throat, or other symptoms of a cold or flu. Do not treat yourself. Avoid taking products that contain aspirin, acetaminophen, ibuprofen,   naproxen, or ketoprofen unless instructed by your doctor. These medicines may hide a fever. Do not become pregnant while taking this medicine. Women should inform their doctor if they wish to become pregnant or think they might be pregnant. There is a potential for serious side effects to an unborn child. Talk to your health care professional or pharmacist for more information. Do not breast-feed an infant while taking this medicine. Men may have a lower sperm count while taking this medicine. Talk to your doctor if you plan to father a child. What side effects may I notice from  receiving this medicine? Side effects that you should report to your doctor or health care professional as soon as possible: -allergic reactions like skin rash, itching or hives, swelling of the face, lips, or tongue -breathing problems -confusion or changes in emotions or moods -constipation -cough -mouth sores -muscle weakness -nausea and vomiting -pain, swelling, redness or irritation at the injection site -pain, tingling, numbness in the hands or feet -problems with balance, talking, walking -seizures -stomach pain -trouble passing urine or change in the amount of urine Side effects that usually do not require medical attention (report to your doctor or health care professional if they continue or are bothersome): -diarrhea -hair loss -jaw pain -loss of appetite This list may not describe all possible side effects. Call your doctor for medical advice about side effects. You may report side effects to FDA at 1-800-FDA-1088. Where should I keep my medicine? This drug is given in a hospital or clinic and will not be stored at home. NOTE: This sheet is a summary. It may not cover all possible information. If you have questions about this medicine, talk to your doctor, pharmacist, or health care provider.  2018 [employer]/Gold Standard (2008-06-13 17:17:13) Cyclophosphamide injection What is this medicine? CYCLOPHOSPHAMIDE (sye kloe FOSS fa mide) is a chemotherapy drug. It slows the growth of cancer cells. This medicine is used to treat many types of cancer like lymphoma, myeloma, leukemia, breast cancer, and ovarian cancer, to name a few. This medicine may be used for other purposes; ask your health care provider or pharmacist if you have questions. COMMON BRAND NAME(S): Cytoxan, Neosar What should I tell my health care provider before I take this medicine? They need to know if you have any of these conditions: -blood disorders -history of other chemotherapy -infection -kidney  disease -liver disease -recent or ongoing radiation therapy -tumors in the bone marrow -an unusual or allergic reaction to cyclophosphamide, other chemotherapy, other medicines, foods, dyes, or preservatives -pregnant or trying to get pregnant -breast-feeding How should I use this medicine? This drug is usually given as an injection into a vein or muscle or by infusion into a vein. It is administered in a hospital or clinic by a specially trained health care professional. Talk to your pediatrician regarding the use of this medicine in children. Special care may be needed. Overdosage: If you think you have taken too much of this medicine contact a poison control center or emergency room at once. NOTE: This medicine is only for you. Do not share this medicine with others. What if I miss a dose? It is important not to miss your dose. Call your doctor or health care professional if you are unable to keep an appointment. What may interact with this medicine? This medicine may interact with the following medications: -amiodarone -amphotericin B -azathioprine -certain antiviral medicines for HIV or AIDS such as protease inhibitors (e.g., indinavir, ritonavir) and zidovudine -certain blood   pressure medications such as benazepril, captopril, enalapril, fosinopril, lisinopril, moexipril, monopril, perindopril, quinapril, ramipril, trandolapril -certain cancer medications such as anthracyclines (e.g., daunorubicin, doxorubicin), busulfan, cytarabine, paclitaxel, pentostatin, tamoxifen, trastuzumab -certain diuretics such as chlorothiazide, chlorthalidone, hydrochlorothiazide, indapamide, metolazone -certain medicines that treat or prevent blood clots like warfarin -certain muscle relaxants such as succinylcholine -cyclosporine -etanercept -indomethacin -medicines to increase blood counts like filgrastim, pegfilgrastim, sargramostim -medicines used as general  anesthesia -metronidazole -natalizumab This list may not describe all possible interactions. Give your health care provider a list of all the medicines, herbs, non-prescription drugs, or dietary supplements you use. Also tell them if you smoke, drink alcohol, or use illegal drugs. Some items may interact with your medicine. What should I watch for while using this medicine? Visit your doctor for checks on your progress. This drug may make you feel generally unwell. This is not uncommon, as chemotherapy can affect healthy cells as well as cancer cells. Report any side effects. Continue your course of treatment even though you feel ill unless your doctor tells you to stop. Drink water or other fluids as directed. Urinate often, even at night. In some cases, you may be given additional medicines to help with side effects. Follow all directions for their use. Call your doctor or health care professional for advice if you get a fever, chills or sore throat, or other symptoms of a cold or flu. Do not treat yourself. This drug decreases your body's ability to fight infections. Try to avoid being around people who are sick. This medicine may increase your risk to bruise or bleed. Call your doctor or health care professional if you notice any unusual bleeding. Be careful brushing and flossing your teeth or using a toothpick because you may get an infection or bleed more easily. If you have any dental work done, tell your dentist you are receiving this medicine. You may get drowsy or dizzy. Do not drive, use machinery, or do anything that needs mental alertness until you know how this medicine affects you. Do not become pregnant while taking this medicine or for 1 year after stopping it. Women should inform their doctor if they wish to become pregnant or think they might be pregnant. Men should not father a child while taking this medicine and for 4 months after stopping it. There is a potential for serious side  effects to an unborn child. Talk to your health care professional or pharmacist for more information. Do not breast-feed an infant while taking this medicine. This medicine may interfere with the ability to have a child. This medicine has caused ovarian failure in some women. This medicine has caused reduced sperm counts in some men. You should talk with your doctor or health care professional if you are concerned about your fertility. If you are going to have surgery, tell your doctor or health care professional that you have taken this medicine. What side effects may I notice from receiving this medicine? Side effects that you should report to your doctor or health care professional as soon as possible: -allergic reactions like skin rash, itching or hives, swelling of the face, lips, or tongue -low blood counts - this medicine may decrease the number of white blood cells, red blood cells and platelets. You may be at increased risk for infections and bleeding. -signs of infection - fever or chills, cough, sore throat, pain or difficulty passing urine -signs of decreased platelets or bleeding - bruising, pinpoint red spots on the skin, black, tarry stools, blood   in the urine -signs of decreased red blood cells - unusually weak or tired, fainting spells, lightheadedness -breathing problems -dark urine -dizziness -palpitations -swelling of the ankles, feet, hands -trouble passing urine or change in the amount of urine -weight gain -yellowing of the eyes or skin Side effects that usually do not require medical attention (report to your doctor or health care professional if they continue or are bothersome): -changes in nail or skin color -hair loss -missed menstrual periods -mouth sores -nausea, vomiting This list may not describe all possible side effects. Call your doctor for medical advice about side effects. You may report side effects to FDA at 1-800-FDA-1088. Where should I keep my  medicine? This drug is given in a hospital or clinic and will not be stored at home. NOTE: This sheet is a summary. It may not cover all possible information. If you have questions about this medicine, talk to your doctor, pharmacist, or health care provider.  2018 [employer] Standard (2012-07-31 16:22:58)  

## 2017-04-15 ENCOUNTER — Inpatient Hospital Stay: Payer: BLUE CROSS/BLUE SHIELD

## 2017-04-17 ENCOUNTER — Inpatient Hospital Stay: Payer: BLUE CROSS/BLUE SHIELD

## 2017-04-17 ENCOUNTER — Encounter: Payer: Self-pay | Admitting: Oncology

## 2017-04-17 ENCOUNTER — Other Ambulatory Visit: Payer: Self-pay | Admitting: Oncology

## 2017-04-17 ENCOUNTER — Inpatient Hospital Stay (HOSPITAL_BASED_OUTPATIENT_CLINIC_OR_DEPARTMENT_OTHER): Payer: BLUE CROSS/BLUE SHIELD | Admitting: Oncology

## 2017-04-17 VITALS — BP 144/77 | HR 96 | Temp 97.4°F | Resp 20

## 2017-04-17 VITALS — BP 154/81 | HR 93 | Temp 97.3°F | Ht 70.0 in | Wt 290.4 lb

## 2017-04-17 DIAGNOSIS — R188 Other ascites: Secondary | ICD-10-CM

## 2017-04-17 DIAGNOSIS — E785 Hyperlipidemia, unspecified: Secondary | ICD-10-CM | POA: Diagnosis not present

## 2017-04-17 DIAGNOSIS — Z5111 Encounter for antineoplastic chemotherapy: Secondary | ICD-10-CM

## 2017-04-17 DIAGNOSIS — E669 Obesity, unspecified: Secondary | ICD-10-CM

## 2017-04-17 DIAGNOSIS — J90 Pleural effusion, not elsewhere classified: Secondary | ICD-10-CM | POA: Diagnosis not present

## 2017-04-17 DIAGNOSIS — M109 Gout, unspecified: Secondary | ICD-10-CM

## 2017-04-17 DIAGNOSIS — I10 Essential (primary) hypertension: Secondary | ICD-10-CM | POA: Diagnosis not present

## 2017-04-17 DIAGNOSIS — N400 Enlarged prostate without lower urinary tract symptoms: Secondary | ICD-10-CM

## 2017-04-17 DIAGNOSIS — C8333 Diffuse large B-cell lymphoma, intra-abdominal lymph nodes: Secondary | ICD-10-CM

## 2017-04-17 DIAGNOSIS — Z87891 Personal history of nicotine dependence: Secondary | ICD-10-CM

## 2017-04-17 DIAGNOSIS — K7460 Unspecified cirrhosis of liver: Secondary | ICD-10-CM | POA: Diagnosis not present

## 2017-04-17 DIAGNOSIS — I700 Atherosclerosis of aorta: Secondary | ICD-10-CM

## 2017-04-17 DIAGNOSIS — Z5112 Encounter for antineoplastic immunotherapy: Secondary | ICD-10-CM

## 2017-04-17 DIAGNOSIS — K219 Gastro-esophageal reflux disease without esophagitis: Secondary | ICD-10-CM | POA: Diagnosis not present

## 2017-04-17 DIAGNOSIS — C786 Secondary malignant neoplasm of retroperitoneum and peritoneum: Secondary | ICD-10-CM | POA: Diagnosis not present

## 2017-04-17 DIAGNOSIS — M1990 Unspecified osteoarthritis, unspecified site: Secondary | ICD-10-CM

## 2017-04-17 DIAGNOSIS — Z7982 Long term (current) use of aspirin: Secondary | ICD-10-CM

## 2017-04-17 DIAGNOSIS — Z79899 Other long term (current) drug therapy: Secondary | ICD-10-CM

## 2017-04-17 LAB — COMPREHENSIVE METABOLIC PANEL
ALT: 12 U/L — AB (ref 17–63)
AST: 17 U/L (ref 15–41)
Albumin: 3.5 g/dL (ref 3.5–5.0)
Alkaline Phosphatase: 57 U/L (ref 38–126)
Anion gap: 6 (ref 5–15)
BUN: 17 mg/dL (ref 6–20)
CHLORIDE: 106 mmol/L (ref 101–111)
CO2: 27 mmol/L (ref 22–32)
CREATININE: 0.99 mg/dL (ref 0.61–1.24)
Calcium: 8.9 mg/dL (ref 8.9–10.3)
GFR calc Af Amer: >60 mL/min (ref >60)
Glucose, Bld: 123 mg/dL — ABNORMAL HIGH (ref 65–99)
Potassium: 3.6 mmol/L (ref 3.5–5.1)
SODIUM: 139 mmol/L (ref 135–145)
Total Bilirubin: 0.5 mg/dL (ref 0.3–1.2)
Total Protein: 6.7 g/dL (ref 6.5–8.1)

## 2017-04-17 LAB — CBC WITH DIFFERENTIAL/PLATELET
BASOS ABS: 0.1 10*3/uL (ref 0–0.1)
Basophils Relative: 1 %
EOS PCT: 2 %
Eosinophils Absolute: 0.2 10*3/uL (ref 0–0.7)
HCT: 40.6 % (ref 40.0–52.0)
Hemoglobin: 14 g/dL (ref 13.0–18.0)
LYMPHS PCT: 12 %
Lymphs Abs: 1 10*3/uL (ref 1.0–3.6)
MCH: 29.2 pg (ref 26.0–34.0)
MCHC: 34.5 g/dL (ref 32.0–36.0)
MCV: 84.5 fL (ref 80.0–100.0)
Monocytes Absolute: 0.8 10*3/uL (ref 0.2–1.0)
Monocytes Relative: 9 %
NEUTROS PCT: 76 %
Neutro Abs: 6.4 10*3/uL (ref 1.4–6.5)
PLATELETS: 253 10*3/uL (ref 150–440)
RBC: 4.81 MIL/uL (ref 4.40–5.90)
RDW: 15.7 % — ABNORMAL HIGH (ref 11.5–14.5)
WBC: 8.4 10*3/uL (ref 3.8–10.6)

## 2017-04-17 MED ORDER — VINCRISTINE SULFATE CHEMO INJECTION 1 MG/ML
2.0000 mg | Freq: Once | INTRAVENOUS | Status: AC
  Administered 2017-04-17: 2 mg via INTRAVENOUS
  Filled 2017-04-17: qty 2

## 2017-04-17 MED ORDER — DIPHENHYDRAMINE HCL 25 MG PO CAPS
50.0000 mg | ORAL_CAPSULE | Freq: Once | ORAL | Status: AC
  Administered 2017-04-17: 50 mg via ORAL
  Filled 2017-04-17: qty 2

## 2017-04-17 MED ORDER — PANTOPRAZOLE SODIUM 20 MG PO TBEC: 20.0000 mg | DELAYED_RELEASE_TABLET | Freq: Every day | ORAL | 1 refills | Status: DC

## 2017-04-17 MED ORDER — RITUXIMAB CHEMO INJECTION 500 MG/50ML
375.0000 mg/m2 | Freq: Once | INTRAVENOUS | Status: AC
  Administered 2017-04-17: 1000 mg via INTRAVENOUS
  Filled 2017-04-17: qty 100

## 2017-04-17 MED ORDER — HEPARIN SOD (PORK) LOCK FLUSH 100 UNIT/ML IV SOLN
500.0000 [IU] | Freq: Once | INTRAVENOUS | Status: AC
  Administered 2017-04-17: 500 [IU] via INTRAVENOUS

## 2017-04-17 MED ORDER — DOXORUBICIN HCL CHEMO IV INJECTION 2 MG/ML
50.0000 mg/m2 | Freq: Once | INTRAVENOUS | Status: AC
  Administered 2017-04-17: 128 mg via INTRAVENOUS
  Filled 2017-04-17: qty 64

## 2017-04-17 MED ORDER — METHYLPREDNISOLONE SODIUM SUCC 125 MG IJ SOLR
125.0000 mg | Freq: Once | INTRAMUSCULAR | Status: AC | PRN
  Administered 2017-04-17: 125 mg via INTRAVENOUS
  Filled 2017-04-17: qty 2

## 2017-04-17 MED ORDER — PEGFILGRASTIM 6 MG/0.6ML ~~LOC~~ PSKT
6.0000 mg | PREFILLED_SYRINGE | Freq: Once | SUBCUTANEOUS | Status: AC
  Administered 2017-04-17: 6 mg via SUBCUTANEOUS
  Filled 2017-04-17: qty 0.6

## 2017-04-17 MED ORDER — DEXAMETHASONE SODIUM PHOSPHATE 10 MG/ML IJ SOLN
10.0000 mg | Freq: Once | INTRAMUSCULAR | Status: AC
  Administered 2017-04-17: 10 mg via INTRAVENOUS
  Filled 2017-04-17: qty 1

## 2017-04-17 MED ORDER — SODIUM CHLORIDE 0.9% FLUSH
10.0000 mL | INTRAVENOUS | Status: DC | PRN
  Filled 2017-04-17: qty 10

## 2017-04-17 MED ORDER — PALONOSETRON HCL INJECTION 0.25 MG/5ML
0.2500 mg | Freq: Once | INTRAVENOUS | Status: AC
  Administered 2017-04-17: 0.25 mg via INTRAVENOUS
  Filled 2017-04-17: qty 5

## 2017-04-17 MED ORDER — SODIUM CHLORIDE 0.9 % IV SOLN
2000.0000 mg | Freq: Once | INTRAVENOUS | Status: AC
  Administered 2017-04-17: 2000 mg via INTRAVENOUS
  Filled 2017-04-17: qty 100

## 2017-04-17 MED ORDER — HEPARIN SOD (PORK) LOCK FLUSH 100 UNIT/ML IV SOLN
500.0000 [IU] | Freq: Once | INTRAVENOUS | Status: DC | PRN
  Filled 2017-04-17: qty 5

## 2017-04-17 MED ORDER — SODIUM CHLORIDE 0.9 % IV SOLN: 10.0000 mg | Freq: Once | INTRAVENOUS | Status: DC

## 2017-04-17 MED ORDER — ACETAMINOPHEN 325 MG PO TABS
650.0000 mg | ORAL_TABLET | Freq: Once | ORAL | Status: AC
  Administered 2017-04-17: 650 mg via ORAL
  Filled 2017-04-17: qty 2

## 2017-04-17 MED ORDER — SODIUM CHLORIDE 0.9 % IV SOLN
Freq: Once | INTRAVENOUS | Status: AC
  Administered 2017-04-17: 10:00:00 via INTRAVENOUS
  Filled 2017-04-17: qty 1000

## 2017-04-17 MED ORDER — SODIUM CHLORIDE 0.9% FLUSH
10.0000 mL | Freq: Once | INTRAVENOUS | Status: AC
  Administered 2017-04-17: 10 mL via INTRAVENOUS
  Filled 2017-04-17: qty 10

## 2017-04-17 NOTE — Progress Notes (Signed)
Hematology/Oncology Consult note [facility]  Telephone:([phone] [phone]  Patient Care Team: [name]., MD as PCP - General (Family Medicine) [name], MD as Consulting Physician (Pulmonary Disease)   Name of the patient: Peter Ryan  [id number]  1951-04-01   Date of visit: 04/17/17  Diagnosis- Stage IV DLBCL GCB IPI score- low intermediate risk  Chief complaint/ Reason for visit- on treatment assessment prior to cycle 1 of RCHOP  Heme/Onc history: 1. Patient is a 66 year old male who initially presented with symptoms of worsening shortness of breathand was found to have right pleural effusion on chest x-ray. CT chest with contrast on 11/12/2016 revealed multiple lymph nodes throughout the mediastinum measuring approximately 1 cm in short axis. Upper abdomen showed evidence of ascites and mild cirrhotic changes in the liver. Patient underwent thoracocentesis for recurrent right pleural effusion. Fluid was exudative in nature and LDH in the pleural fluid was elevated. Cytology was negative for malignancy. Patient also had peripheral flow cytometry done which was negative for malignancy. Pleural and peritoneal fluid cytology has been negative for malignancy/ lymphoma  2. Ultrasound of the abdomen on 01/24/2017 showed sludge in the gallbladder and parenchymal of the liver suggesting cirrhosis as well as moderate ascites. Initially his ascites with attribute it to his cirrhosis and nonalcoholic fatty liver disease. He was also treated for culture negative neutrophilic SBP based on asciticfluid analysis. Given that the ascites fluid was exudative in nature which is not consistent with cirrhosis from nonalcoholic fatty liver disease as well as no evidence of splenomegaly or abnormal LFTs, CT abdomen was obtained to rule out any other etiology for ascites  3. CT abdomen on 03/21/2017 showed: IMPRESSION: 1. Bulky central mesenteric  mass lesion encasing major mesenteric vascular anatomy without substantial mass-effect. Diffuse omental caking is evident and peritoneal enhancement is identified. There is associated mesenteric, juxta diaphragmatic, retroperitoneal, and retrocrural lymphadenopathy. Lymphoma would be a consideration. Metastatic disease could also have this appearance. 2. Small a moderate volume ascites  4. Patient denies any unintentional weight loss. His appetite is fair denies any drenching night sweats or recurrent fevers. Does report fatigue  5. PET CT showed: IMPRESSION: 1. Large hypermetabolic left eccentric mesenteric mass with extensive omental caking and peritoneal spread of tumor. This mechanism of spread would seem to favor a gastrointestinal primary, with entities such as carcinoid tumor or lymphoma as differential diagnostic considerations. Tissue diagnosis recommended. 2. Hypermetabolic mediastinal adenopathy compatible with malignancy. Moderate to large bilateral pleural effusions with passive atelectasis and low-grade activity along the pleural fluid-early malignant pleural effusions not excluded. 3. Aortic Atherosclerosis (ICD10-I70.0). 4. No definite involvement of the neck or skeleton. 5. Moderately enlarged prostate gland.  6. Omental biopsy showed: DIAGNOSIS:  A. SOFT TISSUE MASS, LEFT MESENTERY; CT-GUIDED CORE BIOPSY:  - LARGE B-CELL LYMPHOMA, CD10 POSITIVE.   Comment:  There is a proliferation of neoplastic lymphoid cells in a sclerotic  background. The infiltrate is predominantly diffuse, with some areas  containing small nodules. The cytologic features are similar in all  areas and the nodularity may be an artifact of the sclerosis. The  lymphoid cells are large, with irregular nuclei, small nucleoli, and  pale cytoplasm. The flow cytometry and immunohistochemistry (IHC)  results are consistent with diffuse large B-cell lymphoma, not otherwise  specified, germinal  center B-cell type, but final classification will be  based on the results of FISH analysis for MYC, BCL2, and BCL6 gene  rearrangements. Based on this sample,  a coexisting follicular lymphoma  cannot be excluded  FISH testing for BCL 6 and cmyc was negative. Therefore he does not have a double hit lymphoma. Bone marrow biopsy was negative for lymphoma   Interval history- doing well. Reports abdominal distension and bloating. Denies other complaints  ECOG PS- 0 Pain scale- 0  Review of systems- Review of Systems  Constitutional: Negative for chills, fever, malaise/fatigue and weight loss.  HENT: Negative for congestion, ear discharge and nosebleeds.   Eyes: Negative for blurred vision.  Respiratory: Negative for cough, hemoptysis, sputum production, shortness of breath and wheezing.   Cardiovascular: Negative for chest pain, palpitations, orthopnea and claudication.  Gastrointestinal: Negative for abdominal pain, blood in stool, constipation, diarrhea, heartburn, melena, nausea and vomiting.  Genitourinary: Negative for dysuria, flank pain, frequency, hematuria and urgency.  Musculoskeletal: Negative for back pain, joint pain and myalgias.  Skin: Negative for rash.  Neurological: Negative for dizziness, tingling, focal weakness, seizures, weakness and headaches.  Endo/Heme/Allergies: Does not bruise/bleed easily.  Psychiatric/Behavioral: Negative for depression and suicidal ideas. The patient does not have insomnia.       Allergies  Allergen Reactions  . Metformin Other (See Comments)    Stomach upset      Past Medical History:  Diagnosis Date  . Arthritis   . BPH (benign prostatic hyperplasia)   . Chronic prostatitis   . Cirrhosis of liver ([place])   . Dyslipidemia   . Elevated PSA   . Gastric reflux   . Gout   . HTN (hypertension)   . Over weight   . Psoriasis   . Testicular pain, right   . Urinary frequency      Past Surgical History:  Procedure Laterality  Date  . hydrocelectomy    . IR FLUORO GUIDE PORT INSERTION RIGHT  04/11/2017  . PILONIDAL CYST EXCISION      Social History   Social History  . Marital status: Married    Spouse name: N/A  . Number of children: N/A  . Years of education: N/A   Occupational History  . Not on file.   Social History Main Topics  . Smoking status: Former Smoker    Packs/day: 1.50    Years: 8.00    Types: Cigarettes  . Smokeless tobacco: Never Used     Comment: quit 40 years ago  . Alcohol use No     Comment: rarely  . Drug use: No  . Sexual activity: Not on file   Other Topics Concern  . Not on file   Social History Narrative  . No narrative on file    Family History  Problem Relation Age of Onset  . Uterine cancer Mother   . Diabetes Mother   . Cancer Mother   . Cancer Maternal Uncle   . Bladder Cancer Neg Hx   . Kidney disease Neg Hx   . Prostate cancer Neg Hx      Current Outpatient Prescriptions:  .  allopurinol (ZYLOPRIM) 300 MG tablet, TAKE ONE TABLET EVERY DAY, Disp: 30 tablet, Rfl: 5 .  allopurinol (ZYLOPRIM) 300 MG tablet, Take 1 tablet (300 mg total) by mouth daily., Disp: 30 tablet, Rfl: 3 .  amLODipine (NORVASC) 10 MG tablet, TAKE ONE TABLET EVERY DAY, Disp: 30 tablet, Rfl: 6 .  aspirin EC 81 MG tablet, Take 81 mg by mouth daily. , Disp: , Rfl:  .  Cholecalciferol (VITAMIN D3) 1000 UNITS CAPS, Take 1,000 Units by mouth daily. , Disp: , Rfl:  .  finasteride (PROSCAR) 5 MG tablet, Take 1 tablet (5 mg total) by mouth daily., Disp: 90 tablet, Rfl: 4 .  furosemide (LASIX) 20 MG tablet, Take 1 tablet (20 mg total) by mouth daily. (Patient not taking: Reported on 03/24/2017), Disp: 30 tablet, Rfl: 2 .  lidocaine-prilocaine (EMLA) cream, Apply to affected area once, Disp: 30 g, Rfl: 3 .  LORazepam (ATIVAN) 0.5 MG tablet, Take 1 tablet (0.5 mg total) by mouth every 6 (six) hours as needed (Nausea or vomiting)., Disp: 30 tablet, Rfl: 0 .  losartan (COZAAR) 50 MG tablet, Take 1  tablet (50 mg total) by mouth daily., Disp: 90 tablet, Rfl: 3 .  ondansetron (ZOFRAN) 8 MG tablet, Take 1 tablet (8 mg total) by mouth 2 (two) times daily as needed for refractory nausea / vomiting. Start on day 3 after cyclophosphamide chemotherapy., Disp: 30 tablet, Rfl: 1 .  predniSONE (DELTASONE) 20 MG tablet, Take 5 tablets (100 mg total) by mouth daily. Take on days 1-5 of chemotherapy., Disp: 25 tablet, Rfl: 6 .  prochlorperazine (COMPAZINE) 10 MG tablet, Take 1 tablet (10 mg total) by mouth every 6 (six) hours as needed (Nausea or vomiting)., Disp: 30 tablet, Rfl: 6 .  spironolactone (ALDACTONE) 50 MG tablet, Take 1 tablet (50 mg total) by mouth 2 (two) times daily. (Patient not taking: Reported on 04/08/2017), Disp: 60 tablet, Rfl: 3 No current facility-administered medications for this visit.   Facility-Administered Medications Ordered in Other Visits:  .  heparin lock flush 100 unit/mL, 500 Units, Intravenous, Once, Creig Hines, MD  Physical exam:  Vitals:   04/17/17 0837  BP: (!) 154/81  Pulse: 93  Temp: (!) 97.3 F (36.3 C)  TempSrc: Tympanic  Weight: 290 lb 6.4 oz (131.7 kg)  Height: 5\' 10"  (1.778 m)   Physical Exam  Constitutional: He is oriented to person, place, and time and well-developed, well-nourished, and in no distress.  HENT:  Head: Normocephalic and atraumatic.  Eyes: Pupils are equal, round, and reactive to light. EOM are normal.  Neck: Normal range of motion.  Cardiovascular: Normal rate, regular rhythm and normal heart sounds.   Pulmonary/Chest: Effort normal and breath sounds normal.  Abdominal: Bowel sounds are normal.  Firm, distended  Neurological: He is alert and oriented to person, place, and time.  Skin: Skin is warm and dry.     CMP Latest Ref Rng & Units 04/04/2017  Glucose 65 - 99 mg/dL 06/05/2017)  BUN 6 - 20 mg/dL 18  Creatinine 316(H - 7.73 mg/dL 1.79  Sodium 1.52 - 484 mmol/L 138  Potassium 3.5 - 5.1 mmol/L 3.6  Chloride 101 - 111 mmol/L 104   CO2 22 - 32 mmol/L 26  Calcium 8.9 - 10.3 mg/dL 948)  Total Protein 6.5 - 8.1 g/dL 6.8  Total Bilirubin 0.3 - 1.2 mg/dL 0.5  Alkaline Phos 38 - 126 U/L 49  AST 15 - 41 U/L 19  ALT 17 - 63 U/L 12(L)   CBC Latest Ref Rng & Units 04/11/2017  WBC 3.8 - 10.6 K/uL 8.3  Hemoglobin 13.0 - 18.0 g/dL 04/13/2017  Hematocrit 99.7 - 52.0 % 41.6  Platelets 150 - 440 K/uL 256    No images are attached to the encounter.  Ct Abdomen Pelvis W Wo Contrast  Result Date: 03/21/2017 CLINICAL DATA:  Cirrhosis with ascites. EXAM: CT ABDOMEN AND PELVIS WITHOUT AND WITH CONTRAST TECHNIQUE: Multidetector CT imaging of the abdomen and pelvis was performed following the standard protocol before and following the bolus  administration of intravenous contrast. CONTRAST:  125 cc Isovue 370 COMPARISON:  None. FINDINGS: Lower chest: Dependent atelectasis noted right lower lobe with moderate right pleural effusion and small left pleural effusion. Enlarged anterior right juxta diaphragmatic lymph nodes are evident, measuring up to 2.1 cm short axis on the right. Left anterior juxta diaphragmatic or lower mediastinal lymph node measures 1.8 cm short axis. Hepatobiliary: Nodular liver contour with subtle heterogeneity of the liver parenchyma is in keeping with the reported clinical history of cirrhosis. No focal mass lesion identified within the liver parenchyma. There is no evidence for gallstones, gallbladder wall thickening, or pericholecystic fluid. No intrahepatic or extrahepatic biliary dilation. Pancreas: No focal mass lesion. No dilatation of the main duct. No intraparenchymal cyst. No peripancreatic edema. Spleen: No splenomegaly. No focal mass lesion. Adrenals/Urinary Tract: No adrenal nodule or mass. Small low-density lesions in the inferior right kidney cannot be fully characterized. 5.6 cm low-density lesion in the lower pole the right kidney approaches water attenuation and is compatible with a cyst. No evidence for  hydroureter. The urinary bladder appears normal for the degree of distention. Stomach/Bowel: Stomach is nondistended. No gastric wall thickening. No evidence of outlet obstruction. Duodenum is normally positioned as is the ligament of Treitz. Visualized small bowel loops are nondilated. Diverticular changes are noted in the left colon without evidence of diverticulitis. Vascular/Lymphatic: No abdominal aortic aneurysm. Small lymph nodes are identified in the gastrohepatic ligament. 14 mm short axis retrocaval lymph node is seen on image 38. 15 mm short axis right retrocrural lymph node is visible on image 30. 7.3 x 13.5 cm mass is identified in the central small bowel mesentery, circumferentially encasing the superior mesenteric artery and vein. Diffuse omental caking is associated. Reproductive: The prostate gland and seminal vesicles have normal imaging features. Other: Small to moderate volume ascites. Peritoneal enhancement identified in the pelvis raises concern for peritoneal carcinomatosis. Musculoskeletal: Bone windows reveal no worrisome lytic or sclerotic osseous lesions. IMPRESSION: 1. Bulky central mesenteric mass lesion encasing major mesenteric vascular anatomy without substantial mass-effect. Diffuse omental caking is evident and peritoneal enhancement is identified. There is associated mesenteric, juxta diaphragmatic, retroperitoneal, and retrocrural lymphadenopathy. Lymphoma would be a consideration. Metastatic disease could also have this appearance. 2. Small a moderate volume ascites Electronically Signed   By: Misty Stanley M.D.   On: 03/21/2017 11:07   Ct Abdomen Pelvis W Contrast  Result Date: 04/04/2017 CLINICAL DATA:  Tightness of the abdomen with erythema and purplish color. EXAM: CT ABDOMEN AND PELVIS WITH CONTRAST TECHNIQUE: Multidetector CT imaging of the abdomen and pelvis was performed using the standard protocol following bolus administration of intravenous contrast. CONTRAST:   197m ISOVUE-300 IOPAMIDOL (ISOVUE-300) INJECTION 61% COMPARISON:  03/21/2017 CT and CT-guided paracentesis images 04/01/2017. FINDINGS: Lower chest: Normal size cardiac chambers. No pericardial effusion. Moderate to large right and small to moderate left pleural effusions with compressive atelectasis. Hepatobiliary: Nodular liver surface consistent with cirrhosis. Tiny mm hypodensity in the left hepatic lobe is unchanged but too small further characterize. No biliary dilatation is noted. The gallbladder is physiologically distended in appearance without wall thickening. Probable biliary sludge accounting for faint hyperdensities noted within. Pancreas: No mass or ductal dilatation. Spleen: Normal in size without focal abnormality. Adrenals/Urinary Tract: No adrenal mass. Stable 5.6 x 3.9 cm cyst arising from the anterior aspect of the left lower pole of the kidney, unchanged in appearance, series 2, image 36. No hydroureter. Urinary bladder is unremarkable for degree of distention. Stomach/Bowel: Contrast filled nondistended  stomach with normal small bowel rotation. Contrast reaches the cecum. There is stool within much of the large intestine without obstruction noted. Scattered colonic diverticulosis along sigmoid colon is identified without diverticulitis. Vascular/Lymphatic: No abdominal aortic aneurysm. Minimally larger right retrocrural lymph node at 18 mm short axis versus 15 mm previously. 12 mm retrocaval lymph node is stable versus 14 mm previously. Enlarged anterior juxtadiaphragmatic lymph nodes are again noted measuring 2.4 and 1.9 cm versus 2.1 and 1.8 cm previously. Once again there is a central mesenteric mass encasing portions of the SMA measuring 13.9 x 7.6 cm versus 13.5 x 7.3 cm at the same level previously. Diffuse omental caking is seen. Reproductive: Top-normal size prostate gland with peripheral zone calcifications. Other: Small to moderate amount of ascites is again noted. Peritoneal  enhancement suggestive carcinomatosis. Musculoskeletal: No worrisome lytic or sclerotic osseous lesions. IMPRESSION: 1. Bilateral pleural effusions and ascites similar to prior exam. 2. Slightly larger right retrocrural and juxta diaphragmatic lymph nodes since prior exam with stable central mesenteric mass which partially encases the SMA, estimated at 13.9 x 7.6 cm versus 13.5 x 7.3 cm at the same level. Electronically Signed   By: Ashley Royalty M.D.   On: 04/04/2017 22:41   Nm Pet Image Initial (pi) Skull Base To Thigh  Result Date: 03/28/2017 CLINICAL DATA:  Initial treatment strategy for central mesenteric mass with omental caking and peritoneal enhancement on reason imaging. EXAM: NUCLEAR MEDICINE PET SKULL BASE TO THIGH TECHNIQUE: Multiple exams, including 12.7 mCi F-18 FDG was injected intravenously. Full-ring PET imaging was performed from the skull base to thigh after the radiotracer. CT data was obtained and used for attenuation correction and anatomic localization. FASTING BLOOD GLUCOSE:  Value: 106 mg/dl COMPARISON:  Multiple exams, including 03/21/2017 FINDINGS: NECK No hypermetabolic lymph nodes in the neck. CHEST Hypermetabolic 1.4 cm prevascular lymph node on image 93/ 3 has maximum standard uptake value 8.3. Left upper internal mammary lymph node measuring 0.6 cm in short axis on image 93/ 3 has maximum standard uptake value 5.4. A left lower paraesophageal lymph node measuring 0.8 cm in short axis on image 119/ 3 has maximum SUV 6.6. There multiple large hypermetabolic pericardial lymph nodes. An index lesion eccentric to the right has a short axis diameter of 2.1 cm and maximum SUV 4.9. Moderate large bilateral pleural effusions with passive atelectasis. Very low-grade activity along the pleural fluid. ABDOMEN/PELVIS The large left eccentric central mesenteric mass has a maximum SUV of 23.4. There is diffuse peritoneal tumor deposition with omental caking. A representative lower abdominal region  of omental caking has a maximum SUV of 8.5 in the midline. Nodular areas of hypermetabolic activity along the moderate ascites. No definite metastatic lesions in the liver, spleen, pancreas, or kidneys. Left kidney lower pole cyst noted. Right retrocrural node measuring 1.3 cm in short axis on image 152/3 has a maximum SUV of 13.9. Aortoiliac atherosclerotic vascular disease. Sigmoid colon diverticulosis. Moderately enlarged prostate gland. SKELETON No focal hypermetabolic activity to suggest skeletal metastasis. IMPRESSION: 1. Large hypermetabolic left eccentric mesenteric mass with extensive omental caking and peritoneal spread of tumor. This mechanism of spread would seem to favor a gastrointestinal primary, with entities such as carcinoid tumor or lymphoma as differential diagnostic considerations. Tissue diagnosis recommended. 2. Hypermetabolic mediastinal adenopathy compatible with malignancy. Moderate to large bilateral pleural effusions with passive atelectasis and low-grade activity along the pleural fluid-early malignant pleural effusions not excluded. 3.  Aortic Atherosclerosis (ICD10-I70.0). 4. No definite involvement of the neck or  skeleton. 5. Moderately enlarged prostate gland. Electronically Signed   By: Van Clines M.D.   On: 03/28/2017 12:32   Ct Biopsy  Result Date: 04/01/2017 CLINICAL DATA:  Left mesenteric mass with omental caking, pleural effusions, and ascites. Previous pleural and peritoneal cytology negative. EXAM: CT GUIDED PARACENTESIS CT GUIDED CORE BIOPSY OF LEFT MESENTERIC MASS ANESTHESIA/SEDATION: Intravenous Fentanyl and Versed were administered as conscious sedation during continuous monitoring of the patient's level of consciousness and physiological / cardiorespiratory status by the radiology RN, with a total moderate sedation time of 23 minutes. PROCEDURE: The procedure risks, benefits, and alternatives were explained to the patient. Questions regarding the procedure  were encouraged and answered. The patient understands and consents to the procedure. Survey ultrasound of the abdomen was performed. Moderate ascites was identified. An appropriate skin entry site was determined and marked. Skin was prepped with chlorhexidine and infiltrated locally with 1% lidocaine. A Safe-T-Centesis needle was advanced into the peritoneal space. Approximately 2 L of chylous fluid were removed, sample sent for cytology analysis. Subsequently, an appropriate skin entry site for biopsy was determined and marked. The operative field was prepped with chlorhexidinein a sterile fashion, and a sterile drape was applied covering the operative field. A sterile gown and sterile gloves were used for the procedure. Local anesthesia was provided with 1% Lidocaine. Under CT fluoroscopic guidance, a 17 gauge trocar needle was advanced to the margin of the left mesenteric lesion. Once needle tip position was confirmed, coaxial 18-gauge core biopsy samples were obtained, submitted in saline to surgical pathology. The guide needle was removed. The patient tolerated the procedure well. COMPLICATIONS: None immediate FINDINGS: Initial CT demonstrated moderate bilateral pleural effusions and moderate abdominal ascites. Omental caking and the left mesenteric mass again localized corresponding to findings on recent PET-CT. Enlarged pericardial and retrocrural lymph nodes again noted. Because of the re- accumulation of ascites, paracentesis was performed previous deep core biopsy removing 2 L of chylous fluid. CT guided core biopsy of the left mesenteric mass was then performed without complication. IMPRESSION: 1. Paracentesis with CT guidance removing 2 L, sample sent for cytology analysis. 2. Technically successful CT-guided core biopsy of left mesenteric mass. Electronically Signed   By: Lucrezia Europe M.D.   On: 04/01/2017 12:15   Ct Bone Marrow Biopsy & Aspiration  Result Date: 04/11/2017 CLINICAL DATA:  Diffuse  large B-cell lymphoma and need for bone marrow biopsy. EXAM: CT GUIDED BONE MARROW ASPIRATION AND BIOPSY ANESTHESIA/SEDATION: Versed 2.0 mg IV, Fentanyl 100 mcg IV Total Moderate Sedation Time: 20 minutes. The patient's level of consciousness and physiologic status were continuously monitored during the procedure by Radiology nursing. PROCEDURE: The procedure risks, benefits, and alternatives were explained to the patient. Questions regarding the procedure were encouraged and answered. The patient understands and consents to the procedure. A time out was performed prior to initiating the procedure. The right gluteal region was prepped with chlorhexidine. Sterile gown and sterile gloves were used for the procedure. Local anesthesia was provided with 1% Lidocaine. Under CT guidance, an 11 gauge On Control bone cutting needle was advanced from a posterior approach into the right iliac bone. Needle positioning was confirmed with CT. Initial non heparinized and heparinized aspirate samples were obtained of bone marrow. Core biopsy was performed via the On Control drill needle. COMPLICATIONS: None FINDINGS: Inspection of initial aspirate did reveal visible particles. Intact core biopsy sample was obtained. IMPRESSION: CT guided bone marrow biopsy of right posterior iliac bone with both aspirate and core samples  obtained. Electronically Signed   By: Aletta Edouard M.D.   On: 04/11/2017 11:39   Ct Image Guided Drainage Percut Cath  Peritoneal Retroperit  Result Date: 04/01/2017 CLINICAL DATA:  Left mesenteric mass with omental caking, pleural effusions, and ascites. Previous pleural and peritoneal cytology negative. EXAM: CT GUIDED PARACENTESIS CT GUIDED CORE BIOPSY OF LEFT MESENTERIC MASS ANESTHESIA/SEDATION: Intravenous Fentanyl and Versed were administered as conscious sedation during continuous monitoring of the patient's level of consciousness and physiological / cardiorespiratory status by the radiology RN, with a  total moderate sedation time of 23 minutes. PROCEDURE: The procedure risks, benefits, and alternatives were explained to the patient. Questions regarding the procedure were encouraged and answered. The patient understands and consents to the procedure. Survey ultrasound of the abdomen was performed. Moderate ascites was identified. An appropriate skin entry site was determined and marked. Skin was prepped with chlorhexidine and infiltrated locally with 1% lidocaine. A Safe-T-Centesis needle was advanced into the peritoneal space. Approximately 2 L of chylous fluid were removed, sample sent for cytology analysis. Subsequently, an appropriate skin entry site for biopsy was determined and marked. The operative field was prepped with chlorhexidinein a sterile fashion, and a sterile drape was applied covering the operative field. A sterile gown and sterile gloves were used for the procedure. Local anesthesia was provided with 1% Lidocaine. Under CT fluoroscopic guidance, a 17 gauge trocar needle was advanced to the margin of the left mesenteric lesion. Once needle tip position was confirmed, coaxial 18-gauge core biopsy samples were obtained, submitted in saline to surgical pathology. The guide needle was removed. The patient tolerated the procedure well. COMPLICATIONS: None immediate FINDINGS: Initial CT demonstrated moderate bilateral pleural effusions and moderate abdominal ascites. Omental caking and the left mesenteric mass again localized corresponding to findings on recent PET-CT. Enlarged pericardial and retrocrural lymph nodes again noted. Because of the re- accumulation of ascites, paracentesis was performed previous deep core biopsy removing 2 L of chylous fluid. CT guided core biopsy of the left mesenteric mass was then performed without complication. IMPRESSION: 1. Paracentesis with CT guidance removing 2 L, sample sent for cytology analysis. 2. Technically successful CT-guided core biopsy of left mesenteric  mass. Electronically Signed   By: Lucrezia Europe M.D.   On: 04/01/2017 12:15   Ir Fluoro Guide Port Insertion Right  Result Date: 04/11/2017 CLINICAL DATA:  Diffuse large B-cell lymphoma and need for porta cath to begin chemotherapy. EXAM: IMPLANTED PORT A CATH PLACEMENT WITH ULTRASOUND AND FLUOROSCOPIC GUIDANCE ANESTHESIA/SEDATION: 2.0 mg IV Versed; 100 mcg IV Fentanyl Total Moderate Sedation Time:  40 minutes The patient's level of consciousness and physiologic status were continuously monitored during the procedure by Radiology nursing. Additional Medications: 2 g IV Ancef. FLUOROSCOPY TIME:  30 seconds.  21 mGy. PROCEDURE: The procedure, risks, benefits, and alternatives were explained to the patient. Questions regarding the procedure were encouraged and answered. The patient understands and consents to the procedure. A time-out was performed prior to initiating the procedure. Ultrasound was utilized to confirm patency of the right internal jugular vein. The right neck and chest were prepped with chlorhexidine in a sterile fashion, and a sterile drape was applied covering the operative field. Maximum barrier sterile technique with sterile gowns and gloves were used for the procedure. Local anesthesia was provided with 1% lidocaine. After creating a small venotomy incision, a 21 gauge needle was advanced into the right internal jugular vein under direct, real-time ultrasound guidance. Ultrasound image documentation was performed. After securing guidewire access,  an 8 Fr dilator was placed. A J-wire was kinked to measure appropriate catheter length. A subcutaneous port pocket was then created along the upper chest wall utilizing sharp and blunt dissection. Portable cautery was utilized. The pocket was irrigated with sterile saline. A single lumen power injectable port was chosen for placement. The 8 Fr catheter was tunneled from the port pocket site to the venotomy incision. The port was placed in the pocket.  External catheter was trimmed to appropriate length based on guidewire measurement. At the venotomy, an 8 Fr peel-away sheath was placed over a guidewire. The catheter was then placed through the sheath and the sheath removed. Final catheter positioning was confirmed and documented with a fluoroscopic spot image. The port was accessed with a needle and aspirated and flushed with heparinized saline. The access needle was removed. The venotomy and port pocket incisions were closed with subcutaneous 4-0 Monocryl and subcuticular 4-0 Vicryl. Dermabond was applied to both incisions. COMPLICATIONS: COMPLICATIONS None FINDINGS: After catheter placement, the tip lies at the cavo-atrial junction. The catheter aspirates normally and is ready for immediate use. IMPRESSION: Placement of single lumen port a cath via right internal jugular vein. The catheter tip lies at the cavo-atrial junction. A power injectable port a cath was placed and is ready for immediate use. Electronically Signed   By: Aletta Edouard M.D.   On: 04/11/2017 13:49     Assessment and plan- Patient is a 66 y.o. 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  Baseline ECHO from April 2018 showe normal EF. Repeat MUGA pending. HIV, hepatitis testing negative. Discussed with patient that he does not have double hit lymphoma or bone marrow involevemnt. He teherfore has low risk IPI score which translates to OS of 51% at 5 years and CR rate of 67%  Ascites- clinically his abdomen feels tight and distended. I do feel he should respond fairly quickly to chemotherapy and ascites should improve. However if it gets worse and patient is increasingly symptomatic, I will arrange paracentesis. Prefer to avoid paracentesis in the middle of chemotherapy given infection risk  I will see him back in 1 weeks time with repeat labs and assess how he is doing after cycle 1 of  RCHOP  Visit Diagnosis 1. 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 Encounter for antineoplastic chemotherapy and immunotherapy      Dr. Randa Evens, MD, MPH [facility] at [facility] Pager- [phone] 04/17/2017  9:12 AM

## 2017-04-17 NOTE — Progress Notes (Signed)
At 1410, RN went to chair to increase rituxan.  Pt reports that he just "didnt feel well, kind of icky".  He reports feeling a little hot and maybe a little like he was going to throw up.  Spoke with Dr. Janese Banks.  Rate for rituxan kept the same.  At 1420, pt reported that his chest and throat felt warm.  Rituxan stopped.  Pt was monitored per Dr. Janese Banks.  Pt reported feeling slightly worse at 1430 so solumedrol was administered as ordered.  At 1455, pt reported feeling at his baseline again.  Rituxan infusion restarted per policy.  Pt tolerated infusion until 1645 when rate was 300 mg/hr (116mL/hr) and had been at this rate for approximately 15 minutes.  Pt reports "warmth in his chest and throat again".  Rituxan stopped.  Pt given NS, vitals stable.  Per Dr. Janese Banks, pt to return tomorrow morning to complete his infusion with added IV premeds and at a slower rate.  Pt aware of this plan and reports feeling back at his baseline prior to leaving the infusion area.  Neulasta on pro applied today per Dr. Janese Banks.

## 2017-04-17 NOTE — Progress Notes (Signed)
Patient here for pre treatment check. No changes since last appointment. 

## 2017-04-18 ENCOUNTER — Ambulatory Visit
Admission: RE | Admit: 2017-04-18 | Discharge: 2017-04-18 | Disposition: A | Discharge status: Home / Self Care | Payer: BLUE CROSS/BLUE SHIELD | Source: Ambulatory Visit | Attending: Oncology | Admitting: Oncology

## 2017-04-18 ENCOUNTER — Inpatient Hospital Stay: Payer: BLUE CROSS/BLUE SHIELD

## 2017-04-18 VITALS — BP 160/77 | HR 99 | Temp 96.5°F | Resp 20

## 2017-04-18 DIAGNOSIS — C8333 Diffuse large B-cell lymphoma, intra-abdominal lymph nodes: Secondary | ICD-10-CM

## 2017-04-18 DIAGNOSIS — J90 Pleural effusion, not elsewhere classified: Secondary | ICD-10-CM | POA: Insufficient documentation

## 2017-04-18 MED ORDER — HEPARIN SOD (PORK) LOCK FLUSH 100 UNIT/ML IV SOLN
500.0000 [IU] | Freq: Once | INTRAVENOUS | Status: AC
  Administered 2017-04-18: 500 [IU] via INTRAVENOUS

## 2017-04-18 MED ORDER — FAMOTIDINE IN NACL 20-0.9 MG/50ML-% IV SOLN
20.0000 mg | Freq: Once | INTRAVENOUS | Status: AC
  Administered 2017-04-18: 20 mg via INTRAVENOUS
  Filled 2017-04-18: qty 50

## 2017-04-18 MED ORDER — DIPHENHYDRAMINE HCL 50 MG/ML IJ SOLN
25.0000 mg | Freq: Once | INTRAMUSCULAR | Status: AC
  Administered 2017-04-18: 25 mg via INTRAVENOUS
  Filled 2017-04-18: qty 1

## 2017-04-18 MED ORDER — ACETAMINOPHEN 325 MG PO TABS
650.0000 mg | ORAL_TABLET | Freq: Once | ORAL | Status: AC
Start: 2017-04-18 — End: 2017-04-18
  Administered 2017-04-18: 650 mg via ORAL
  Filled 2017-04-18: qty 2

## 2017-04-18 MED ORDER — SODIUM CHLORIDE 0.9 % IV SOLN
900.0000 mg | Freq: Once | INTRAVENOUS | Status: AC
  Administered 2017-04-18: 900 mg via INTRAVENOUS
  Filled 2017-04-18: qty 90

## 2017-04-18 MED ORDER — LORAZEPAM 2 MG/ML IJ SOLN
0.2500 mg | Freq: Once | INTRAMUSCULAR | Status: AC
  Administered 2017-04-18: 0.25 mg via INTRAVENOUS
  Filled 2017-04-18: qty 1

## 2017-04-18 MED ORDER — SODIUM CHLORIDE 0.9 % IV SOLN
20.0000 mg | Freq: Once | INTRAVENOUS | Status: AC
  Administered 2017-04-18: 20 mg via INTRAVENOUS
  Filled 2017-04-18: qty 2

## 2017-04-18 NOTE — Progress Notes (Signed)
Patient had reaction to rituxan on 04/17/17.  Coming back today for a restart with added pre meds.  MD order to add tylenol 650, benadryl 25, lorazepam 0.25, dexamethasone 20, and pepcid 20.  Best estimate of amount of drug given yesterday was ~100mg .  Will give remainder of rituxan (~900mg ) today.

## 2017-04-21 ENCOUNTER — Telehealth: Payer: Self-pay | Admitting: *Deleted

## 2017-04-21 NOTE — Telephone Encounter (Signed)
Patient advised per Lorretta Harp, NP that this is an expected SE of chemo and since there is no fever or other sx, will watch for now. Patient advise to call back if things change or to go to ER if SOB worsens and needs immediate attention

## 2017-04-21 NOTE — Telephone Encounter (Signed)
Patient called to report that since chemo last Thurs/ Friday he has had increasing fatigue and worsening SOB with any exertion; he states it goes away . He is a patient of Dr Janese Banks with Lymphoma and got RCHOP. Please advise

## 2017-04-23 ENCOUNTER — Ambulatory Visit: Payer: BLUE CROSS/BLUE SHIELD

## 2017-04-24 ENCOUNTER — Encounter: Payer: Self-pay | Admitting: Oncology

## 2017-04-24 ENCOUNTER — Inpatient Hospital Stay: Payer: BLUE CROSS/BLUE SHIELD

## 2017-04-24 ENCOUNTER — Inpatient Hospital Stay (HOSPITAL_BASED_OUTPATIENT_CLINIC_OR_DEPARTMENT_OTHER): Payer: BLUE CROSS/BLUE SHIELD | Admitting: Oncology

## 2017-04-24 VITALS — BP 179/78 | HR 86 | Temp 98.2°F | Ht 70.0 in | Wt 281.4 lb

## 2017-04-24 DIAGNOSIS — E669 Obesity, unspecified: Secondary | ICD-10-CM

## 2017-04-24 DIAGNOSIS — N400 Enlarged prostate without lower urinary tract symptoms: Secondary | ICD-10-CM

## 2017-04-24 DIAGNOSIS — Z87891 Personal history of nicotine dependence: Secondary | ICD-10-CM

## 2017-04-24 DIAGNOSIS — C786 Secondary malignant neoplasm of retroperitoneum and peritoneum: Secondary | ICD-10-CM | POA: Diagnosis not present

## 2017-04-24 DIAGNOSIS — K7460 Unspecified cirrhosis of liver: Secondary | ICD-10-CM

## 2017-04-24 DIAGNOSIS — Z7982 Long term (current) use of aspirin: Secondary | ICD-10-CM

## 2017-04-24 DIAGNOSIS — K219 Gastro-esophageal reflux disease without esophagitis: Secondary | ICD-10-CM | POA: Diagnosis not present

## 2017-04-24 DIAGNOSIS — I10 Essential (primary) hypertension: Secondary | ICD-10-CM | POA: Diagnosis not present

## 2017-04-24 DIAGNOSIS — M1990 Unspecified osteoarthritis, unspecified site: Secondary | ICD-10-CM | POA: Diagnosis not present

## 2017-04-24 DIAGNOSIS — J90 Pleural effusion, not elsewhere classified: Secondary | ICD-10-CM

## 2017-04-24 DIAGNOSIS — M109 Gout, unspecified: Secondary | ICD-10-CM

## 2017-04-24 DIAGNOSIS — I700 Atherosclerosis of aorta: Secondary | ICD-10-CM | POA: Diagnosis not present

## 2017-04-24 DIAGNOSIS — C8333 Diffuse large B-cell lymphoma, intra-abdominal lymph nodes: Secondary | ICD-10-CM

## 2017-04-24 DIAGNOSIS — R5383 Other fatigue: Secondary | ICD-10-CM

## 2017-04-24 DIAGNOSIS — R5381 Other malaise: Secondary | ICD-10-CM | POA: Diagnosis not present

## 2017-04-24 DIAGNOSIS — Z79899 Other long term (current) drug therapy: Secondary | ICD-10-CM

## 2017-04-24 DIAGNOSIS — E785 Hyperlipidemia, unspecified: Secondary | ICD-10-CM

## 2017-04-24 LAB — CBC WITH DIFFERENTIAL/PLATELET
BASOS ABS: 0 10*3/uL (ref 0–0.1)
BASOS PCT: 1 %
EOS ABS: 0.2 10*3/uL (ref 0–0.7)
EOS PCT: 14 %
HCT: 38.3 % — ABNORMAL LOW (ref 40.0–52.0)
Hemoglobin: 13.1 g/dL (ref 13.0–18.0)
LYMPHS PCT: 38 %
Lymphs Abs: 0.6 10*3/uL — ABNORMAL LOW (ref 1.0–3.6)
MCH: 28.8 pg (ref 26.0–34.0)
MCHC: 34.1 g/dL (ref 32.0–36.0)
MCV: 84.3 fL (ref 80.0–100.0)
Monocytes Absolute: 0.2 10*3/uL (ref 0.2–1.0)
Monocytes Relative: 15 %
Neutro Abs: 0.5 10*3/uL — ABNORMAL LOW (ref 1.4–6.5)
Neutrophils Relative %: 32 %
PLATELETS: 144 10*3/uL — AB (ref 150–440)
RBC: 4.55 MIL/uL (ref 4.40–5.90)
RDW: 15.1 % — ABNORMAL HIGH (ref 11.5–14.5)
WBC: 1.6 10*3/uL — AB (ref 3.8–10.6)

## 2017-04-24 LAB — COMPREHENSIVE METABOLIC PANEL
ALBUMIN: 3.9 g/dL (ref 3.5–5.0)
ALT: 35 U/L (ref 17–63)
AST: 14 U/L — AB (ref 15–41)
Alkaline Phosphatase: 59 U/L (ref 38–126)
Anion gap: 6 (ref 5–15)
BUN: 19 mg/dL (ref 6–20)
CHLORIDE: 100 mmol/L — AB (ref 101–111)
CO2: 30 mmol/L (ref 22–32)
CREATININE: 0.95 mg/dL (ref 0.61–1.24)
Calcium: 8.6 mg/dL — ABNORMAL LOW (ref 8.9–10.3)
GFR calc Af Amer: >60 mL/min (ref >60)
GFR calc non Af Amer: >60 mL/min (ref >60)
Glucose, Bld: 101 mg/dL — ABNORMAL HIGH (ref 65–99)
Potassium: 3.7 mmol/L (ref 3.5–5.1)
SODIUM: 136 mmol/L (ref 135–145)
Total Bilirubin: 0.6 mg/dL (ref 0.3–1.2)
Total Protein: 6.4 g/dL — ABNORMAL LOW (ref 6.5–8.1)

## 2017-04-24 NOTE — Progress Notes (Signed)
Patient here for follow up wt loss since last appointment, patient experienced shortness of breath last week.

## 2017-04-25 NOTE — Progress Notes (Signed)
Hematology/Oncology Consult note [facility]  Telephone:([phone] [phone]  Patient Care Team: [name]., MD as PCP - General (Family Medicine) [name], MD as Consulting Physician (Pulmonary Disease)   Name of the patient: Peter Ryan  [id number]  10/31/1950   Date of visit: 04/25/17  Diagnosis- Stage IV DLBCL GCB IPI score- low intermediate risk  Chief complaint/ Reason for visit- on treatment assessment prior to cycle 1 of RCHOP  Heme/Onc history: 1. Patient is a 66 year old male who initially presented with symptoms of worsening shortness of breathand was found to have right pleural effusion on chest x-ray. CT chest with contrast on 11/12/2016 revealed multiple lymph nodes throughout the mediastinum measuring approximately 1 cm in short axis. Upper abdomen showed evidence of ascites and mild cirrhotic changes in the liver. Patient underwent thoracocentesis for recurrent right pleural effusion. Fluid was exudative in nature and LDH in the pleural fluid was elevated. Cytology was negative for malignancy. Patient also had peripheral flow cytometry done which was negative for malignancy. Pleural and peritoneal fluid cytology has been negative for malignancy/ lymphoma  2. Ultrasound of the abdomen on 01/24/2017 showed sludge in the gallbladder and parenchymal of the liver suggesting cirrhosis as well as moderate ascites. Initially his ascites with attribute it to his cirrhosis and nonalcoholic fatty liver disease. He was also treated for culture negative neutrophilic SBP based on asciticfluid analysis. Given that the ascites fluid was exudative in nature which is not consistent with cirrhosis from nonalcoholic fatty liver disease as well as no evidence of splenomegaly or abnormal LFTs, CT abdomen was obtained to rule out any other etiology for ascites  3. CT abdomen on 03/21/2017 showed: IMPRESSION: 1. Bulky central mesenteric  mass lesion encasing major mesenteric vascular anatomy without substantial mass-effect. Diffuse omental caking is evident and peritoneal enhancement is identified. There is associated mesenteric, juxta diaphragmatic, retroperitoneal, and retrocrural lymphadenopathy. Lymphoma would be a consideration. Metastatic disease could also have this appearance. 2. Small a moderate volume ascites  4. Patient denies any unintentional weight loss. His appetite is fair denies any drenching night sweats or recurrent fevers. Does report fatigue  5. PET CT showed: IMPRESSION: 1. Large hypermetabolic left eccentric mesenteric mass with extensive omental caking and peritoneal spread of tumor. This mechanism of spread would seem to favor a gastrointestinal primary, with entities such as carcinoid tumor or lymphoma as differential diagnostic considerations. Tissue diagnosis recommended. 2. Hypermetabolic mediastinal adenopathy compatible with malignancy. Moderate to large bilateral pleural effusions with passive atelectasis and low-grade activity along the pleural fluid-early malignant pleural effusions not excluded. 3. Aortic Atherosclerosis (ICD10-I70.0). 4. No definite involvement of the neck or skeleton. 5. Moderately enlarged prostate gland.  6. Omental biopsy showed: DIAGNOSIS:  A. SOFT TISSUE MASS, LEFT MESENTERY; CT-GUIDED CORE BIOPSY:  - LARGE B-CELL LYMPHOMA, CD10 POSITIVE.   Comment:  There is a proliferation of neoplastic lymphoid cells in a sclerotic  background. The infiltrate is predominantly diffuse, with some areas  containing small nodules. The cytologic features are similar in all  areas and the nodularity may be an artifact of the sclerosis. The  lymphoid cells are large, with irregular nuclei, small nucleoli, and  pale cytoplasm. The flow cytometry and immunohistochemistry (IHC)  results are consistent with diffuse large B-cell lymphoma, not otherwise  specified, germinal  center B-cell type, but final classification will be  based on the results of FISH analysis for MYC, BCL2, and BCL6 gene  rearrangements. Based on this sample,  a coexisting follicular lymphoma  cannot be excluded  FISH testing for BCL 6 and cmyc was negative. Therefore he does not have a double hit lymphoma. Bone marrow biopsy was negative for lymphoma. He did not meet criteria for cns prophylaxis. His only risk factors being age and stage IV   Interval history- felt anxious and had palpitations a couple of days after treatment. He did go to work but feels soemwhat weak. No fever. Abdomen feels less distended. eports minor bruise over left forearm after minor injury  ECOG PS- 0 Pain scale- 0   Review of systems- Review of Systems  Constitutional: Positive for malaise/fatigue. Negative for chills, fever and weight loss.  HENT: Negative for congestion, ear discharge and nosebleeds.   Eyes: Negative for blurred vision.  Respiratory: Negative for cough, hemoptysis, sputum production, shortness of breath and wheezing.   Cardiovascular: Negative for chest pain, palpitations, orthopnea and claudication.  Gastrointestinal: Negative for abdominal pain, blood in stool, constipation, diarrhea, heartburn, melena, nausea and vomiting.  Genitourinary: Negative for dysuria, flank pain, frequency, hematuria and urgency.  Musculoskeletal: Negative for back pain, joint pain and myalgias.  Skin: Negative for rash.  Neurological: Negative for dizziness, tingling, focal weakness, seizures, weakness and headaches.  Endo/Heme/Allergies: Does not bruise/bleed easily.  Psychiatric/Behavioral: Negative for depression and suicidal ideas. The patient is nervous/anxious. The patient does not have insomnia.        Allergies  Allergen Reactions  . Metformin Other (See Comments)    Stomach upset      Past Medical History:  Diagnosis Date  . Arthritis   . BPH (benign prostatic hyperplasia)   . Chronic  prostatitis   . Cirrhosis of liver (HCC)   . Dyslipidemia   . Elevated PSA   . Gastric reflux   . Gout   . HTN (hypertension)   . Over weight   . Psoriasis   . Testicular pain, right   . Urinary frequency      Past Surgical History:  Procedure Laterality Date  . hydrocelectomy    . IR FLUORO GUIDE PORT INSERTION RIGHT  04/11/2017  . PILONIDAL CYST EXCISION      Social History   Social History  . Marital status: Married    Spouse name: N/A  . Number of children: N/A  . Years of education: N/A   Occupational History  . Not on file.   Social History Main Topics  . Smoking status: Former Smoker    Packs/day: 1.50    Years: 8.00    Types: Cigarettes  . Smokeless tobacco: Never Used     Comment: quit 40 years ago  . Alcohol use No     Comment: rarely  . Drug use: No  . Sexual activity: Not on file   Other Topics Concern  . Not on file   Social History Narrative  . No narrative on file    Family History  Problem Relation Age of Onset  . Uterine cancer Mother   . Diabetes Mother   . Cancer Mother   . Cancer Maternal Uncle   . Bladder Cancer Neg Hx   . Kidney disease Neg Hx   . Prostate cancer Neg Hx      Current Outpatient Prescriptions:  .  allopurinol (ZYLOPRIM) 300 MG tablet, Take 1 tablet (300 mg total) by mouth daily., Disp: 30 tablet, Rfl: 3 .  amLODipine (NORVASC) 10 MG tablet, TAKE ONE TABLET EVERY DAY, Disp: 30 tablet, Rfl: 6 .  aspirin EC 81  MG tablet, Take 81 mg by mouth daily. , Disp: , Rfl:  .  Cholecalciferol (VITAMIN D3) 1000 UNITS CAPS, Take 1,000 Units by mouth daily. , Disp: , Rfl:  .  finasteride (PROSCAR) 5 MG tablet, Take 1 tablet (5 mg total) by mouth daily., Disp: 90 tablet, Rfl: 4 .  lidocaine-prilocaine (EMLA) cream, Apply to affected area once, Disp: 30 g, Rfl: 3 .  LORazepam (ATIVAN) 0.5 MG tablet, Take 1 tablet (0.5 mg total) by mouth every 6 (six) hours as needed (Nausea or vomiting)., Disp: 30 tablet, Rfl: 0 .  losartan  (COZAAR) 50 MG tablet, Take 1 tablet (50 mg total) by mouth daily., Disp: 90 tablet, Rfl: 3 .  ondansetron (ZOFRAN) 8 MG tablet, Take 1 tablet (8 mg total) by mouth 2 (two) times daily as needed for refractory nausea / vomiting. Start on day 3 after cyclophosphamide chemotherapy., Disp: 30 tablet, Rfl: 1 .  pantoprazole (PROTONIX) 20 MG tablet, Take 1 tablet (20 mg total) by mouth daily., Disp: 30 tablet, Rfl: 1 .  predniSONE (DELTASONE) 20 MG tablet, Take 5 tablets (100 mg total) by mouth daily. Take on days 1-5 of chemotherapy., Disp: 25 tablet, Rfl: 6 .  prochlorperazine (COMPAZINE) 10 MG tablet, Take 1 tablet (10 mg total) by mouth every 6 (six) hours as needed (Nausea or vomiting)., Disp: 30 tablet, Rfl: 6  Physical exam:  Vitals:   04/24/17 1539  BP: (!) 179/78  Pulse: 86  Temp: 98.2 F (36.8 C)  TempSrc: Tympanic  Weight: 281 lb 6.4 oz (127.6 kg)  Height: 5\' 10"  (1.778 m)   Physical Exam  Constitutional: He is oriented to person, place, and time.  Obese. Appears in no acute distress  HENT:  Head: Normocephalic and atraumatic.  Eyes: Pupils are equal, round, and reactive to light. EOM are normal.  Neck: Normal range of motion.  Cardiovascular: Normal rate, regular rhythm and normal heart sounds.   Pulmonary/Chest: Effort normal and breath sounds normal.  Abdominal: Soft. Bowel sounds are normal.  Abdomen feels less distended  Neurological: He is alert and oriented to person, place, and time.  Skin: Skin is warm and dry.  Minor bruise noted over left forearm     CMP Latest Ref Rng & Units 04/24/2017  Glucose 65 - 99 mg/dL 04/26/2017)  BUN 6 - 20 mg/dL 19  Creatinine 465(E - 0.76 mg/dL 1.91  Sodium 5.50 - 271 mmol/L 136  Potassium 3.5 - 5.1 mmol/L 3.7  Chloride 101 - 111 mmol/L 100(L)  CO2 22 - 32 mmol/L 30  Calcium 8.9 - 10.3 mg/dL 423)  Total Protein 6.5 - 8.1 g/dL 6.4(L)  Total Bilirubin 0.3 - 1.2 mg/dL 0.6  Alkaline Phos 38 - 126 U/L 59  AST 15 - 41 U/L 14(L)  ALT 17 -  63 U/L 35   CBC Latest Ref Rng & Units 04/24/2017  WBC 3.8 - 10.6 K/uL 1.6(L)  Hemoglobin 13.0 - 18.0 g/dL 04/26/2017  Hematocrit 94.1 - 52.0 % 38.3(L)  Platelets 150 - 440 K/uL 144(L)    No images are attached to the encounter.  Dg Chest 2 View  Result Date: 04/18/2017 CLINICAL DATA:  Worsened shortness of breath, history of B-cell lymphoma EXAM: CHEST  2 VIEW COMPARISON:  03/06/2017 FINDINGS: Cardiac shadow is enlarged. Right chest wall port is now seen in the mid superior vena cava. Bilateral small pleural effusions are noted with underlying basilar atelectasis/ infiltrate. No bony abnormality is noted. IMPRESSION: Bilateral pleural effusions right greater than left  with associated basilar infiltrate/atelectasis. The overall appearance has worsened in the interval from the prior plain film. Electronically Signed   By: Inez Catalina M.D.   On: 04/18/2017 15:14   Ct Abdomen Pelvis W Contrast  Result Date: 04/04/2017 CLINICAL DATA:  Tightness of the abdomen with erythema and purplish color. EXAM: CT ABDOMEN AND PELVIS WITH CONTRAST TECHNIQUE: Multidetector CT imaging of the abdomen and pelvis was performed using the standard protocol following bolus administration of intravenous contrast. CONTRAST:  144m ISOVUE-300 IOPAMIDOL (ISOVUE-300) INJECTION 61% COMPARISON:  03/21/2017 CT and CT-guided paracentesis images 04/01/2017. FINDINGS: Lower chest: Normal size cardiac chambers. No pericardial effusion. Moderate to large right and small to moderate left pleural effusions with compressive atelectasis. Hepatobiliary: Nodular liver surface consistent with cirrhosis. Tiny mm hypodensity in the left hepatic lobe is unchanged but too small further characterize. No biliary dilatation is noted. The gallbladder is physiologically distended in appearance without wall thickening. Probable biliary sludge accounting for faint hyperdensities noted within. Pancreas: No mass or ductal dilatation. Spleen: Normal in size without  focal abnormality. Adrenals/Urinary Tract: No adrenal mass. Stable 5.6 x 3.9 cm cyst arising from the anterior aspect of the left lower pole of the kidney, unchanged in appearance, series 2, image 36. No hydroureter. Urinary bladder is unremarkable for degree of distention. Stomach/Bowel: Contrast filled nondistended stomach with normal small bowel rotation. Contrast reaches the cecum. There is stool within much of the large intestine without obstruction noted. Scattered colonic diverticulosis along sigmoid colon is identified without diverticulitis. Vascular/Lymphatic: No abdominal aortic aneurysm. Minimally larger right retrocrural lymph node at 18 mm short axis versus 15 mm previously. 12 mm retrocaval lymph node is stable versus 14 mm previously. Enlarged anterior juxtadiaphragmatic lymph nodes are again noted measuring 2.4 and 1.9 cm versus 2.1 and 1.8 cm previously. Once again there is a central mesenteric mass encasing portions of the SMA measuring 13.9 x 7.6 cm versus 13.5 x 7.3 cm at the same level previously. Diffuse omental caking is seen. Reproductive: Top-normal size prostate gland with peripheral zone calcifications. Other: Small to moderate amount of ascites is again noted. Peritoneal enhancement suggestive carcinomatosis. Musculoskeletal: No worrisome lytic or sclerotic osseous lesions. IMPRESSION: 1. Bilateral pleural effusions and ascites similar to prior exam. 2. Slightly larger right retrocrural and juxta diaphragmatic lymph nodes since prior exam with stable central mesenteric mass which partially encases the SMA, estimated at 13.9 x 7.6 cm versus 13.5 x 7.3 cm at the same level. Electronically Signed   By: DAshley RoyaltyM.D.   On: 04/04/2017 22:41   Nm Pet Image Initial (pi) Skull Base To Thigh  Result Date: 03/28/2017 CLINICAL DATA:  Initial treatment strategy for central mesenteric mass with omental caking and peritoneal enhancement on reason imaging. EXAM: NUCLEAR MEDICINE PET SKULL BASE  TO THIGH TECHNIQUE: Multiple exams, including 12.7 mCi F-18 FDG was injected intravenously. Full-ring PET imaging was performed from the skull base to thigh after the radiotracer. CT data was obtained and used for attenuation correction and anatomic localization. FASTING BLOOD GLUCOSE:  Value: 106 mg/dl COMPARISON:  Multiple exams, including 03/21/2017 FINDINGS: NECK No hypermetabolic lymph nodes in the neck. CHEST Hypermetabolic 1.4 cm prevascular lymph node on image 93/ 3 has maximum standard uptake value 8.3. Left upper internal mammary lymph node measuring 0.6 cm in short axis on image 93/ 3 has maximum standard uptake value 5.4. A left lower paraesophageal lymph node measuring 0.8 cm in short axis on image 119/ 3 has maximum SUV 6.6. There multiple  large hypermetabolic pericardial lymph nodes. An index lesion eccentric to the right has a short axis diameter of 2.1 cm and maximum SUV 4.9. Moderate large bilateral pleural effusions with passive atelectasis. Very low-grade activity along the pleural fluid. ABDOMEN/PELVIS The large left eccentric central mesenteric mass has a maximum SUV of 23.4. There is diffuse peritoneal tumor deposition with omental caking. A representative lower abdominal region of omental caking has a maximum SUV of 8.5 in the midline. Nodular areas of hypermetabolic activity along the moderate ascites. No definite metastatic lesions in the liver, spleen, pancreas, or kidneys. Left kidney lower pole cyst noted. Right retrocrural node measuring 1.3 cm in short axis on image 152/3 has a maximum SUV of 13.9. Aortoiliac atherosclerotic vascular disease. Sigmoid colon diverticulosis. Moderately enlarged prostate gland. SKELETON No focal hypermetabolic activity to suggest skeletal metastasis. IMPRESSION: 1. Large hypermetabolic left eccentric mesenteric mass with extensive omental caking and peritoneal spread of tumor. This mechanism of spread would seem to favor a gastrointestinal primary, with  entities such as carcinoid tumor or lymphoma as differential diagnostic considerations. Tissue diagnosis recommended. 2. Hypermetabolic mediastinal adenopathy compatible with malignancy. Moderate to large bilateral pleural effusions with passive atelectasis and low-grade activity along the pleural fluid-early malignant pleural effusions not excluded. 3.  Aortic Atherosclerosis (ICD10-I70.0). 4. No definite involvement of the neck or skeleton. 5. Moderately enlarged prostate gland. Electronically Signed   By: Van Clines M.D.   On: 03/28/2017 12:32   Ct Biopsy  Result Date: 04/01/2017 CLINICAL DATA:  Left mesenteric mass with omental caking, pleural effusions, and ascites. Previous pleural and peritoneal cytology negative. EXAM: CT GUIDED PARACENTESIS CT GUIDED CORE BIOPSY OF LEFT MESENTERIC MASS ANESTHESIA/SEDATION: Intravenous Fentanyl and Versed were administered as conscious sedation during continuous monitoring of the patient's level of consciousness and physiological / cardiorespiratory status by the radiology RN, with a total moderate sedation time of 23 minutes. PROCEDURE: The procedure risks, benefits, and alternatives were explained to the patient. Questions regarding the procedure were encouraged and answered. The patient understands and consents to the procedure. Survey ultrasound of the abdomen was performed. Moderate ascites was identified. An appropriate skin entry site was determined and marked. Skin was prepped with chlorhexidine and infiltrated locally with 1% lidocaine. A Safe-T-Centesis needle was advanced into the peritoneal space. Approximately 2 L of chylous fluid were removed, sample sent for cytology analysis. Subsequently, an appropriate skin entry site for biopsy was determined and marked. The operative field was prepped with chlorhexidinein a sterile fashion, and a sterile drape was applied covering the operative field. A sterile gown and sterile gloves were used for the  procedure. Local anesthesia was provided with 1% Lidocaine. Under CT fluoroscopic guidance, a 17 gauge trocar needle was advanced to the margin of the left mesenteric lesion. Once needle tip position was confirmed, coaxial 18-gauge core biopsy samples were obtained, submitted in saline to surgical pathology. The guide needle was removed. The patient tolerated the procedure well. COMPLICATIONS: None immediate FINDINGS: Initial CT demonstrated moderate bilateral pleural effusions and moderate abdominal ascites. Omental caking and the left mesenteric mass again localized corresponding to findings on recent PET-CT. Enlarged pericardial and retrocrural lymph nodes again noted. Because of the re- accumulation of ascites, paracentesis was performed previous deep core biopsy removing 2 L of chylous fluid. CT guided core biopsy of the left mesenteric mass was then performed without complication. IMPRESSION: 1. Paracentesis with CT guidance removing 2 L, sample sent for cytology analysis. 2. Technically successful CT-guided core biopsy of left mesenteric  mass. Electronically Signed   By: Lucrezia Europe M.D.   On: 04/01/2017 12:15   Ct Bone Marrow Biopsy & Aspiration  Result Date: 04/11/2017 CLINICAL DATA:  Diffuse large B-cell lymphoma and need for bone marrow biopsy. EXAM: CT GUIDED BONE MARROW ASPIRATION AND BIOPSY ANESTHESIA/SEDATION: Versed 2.0 mg IV, Fentanyl 100 mcg IV Total Moderate Sedation Time: 20 minutes. The patient's level of consciousness and physiologic status were continuously monitored during the procedure by Radiology nursing. PROCEDURE: The procedure risks, benefits, and alternatives were explained to the patient. Questions regarding the procedure were encouraged and answered. The patient understands and consents to the procedure. A time out was performed prior to initiating the procedure. The right gluteal region was prepped with chlorhexidine. Sterile gown and sterile gloves were used for the procedure.  Local anesthesia was provided with 1% Lidocaine. Under CT guidance, an 11 gauge On Control bone cutting needle was advanced from a posterior approach into the right iliac bone. Needle positioning was confirmed with CT. Initial non heparinized and heparinized aspirate samples were obtained of bone marrow. Core biopsy was performed via the On Control drill needle. COMPLICATIONS: None FINDINGS: Inspection of initial aspirate did reveal visible particles. Intact core biopsy sample was obtained. IMPRESSION: CT guided bone marrow biopsy of right posterior iliac bone with both aspirate and core samples obtained. Electronically Signed   By: Aletta Edouard M.D.   On: 04/11/2017 11:39   Ct Image Guided Drainage Percut Cath  Peritoneal Retroperit  Result Date: 04/01/2017 CLINICAL DATA:  Left mesenteric mass with omental caking, pleural effusions, and ascites. Previous pleural and peritoneal cytology negative. EXAM: CT GUIDED PARACENTESIS CT GUIDED CORE BIOPSY OF LEFT MESENTERIC MASS ANESTHESIA/SEDATION: Intravenous Fentanyl and Versed were administered as conscious sedation during continuous monitoring of the patient's level of consciousness and physiological / cardiorespiratory status by the radiology RN, with a total moderate sedation time of 23 minutes. PROCEDURE: The procedure risks, benefits, and alternatives were explained to the patient. Questions regarding the procedure were encouraged and answered. The patient understands and consents to the procedure. Survey ultrasound of the abdomen was performed. Moderate ascites was identified. An appropriate skin entry site was determined and marked. Skin was prepped with chlorhexidine and infiltrated locally with 1% lidocaine. A Safe-T-Centesis needle was advanced into the peritoneal space. Approximately 2 L of chylous fluid were removed, sample sent for cytology analysis. Subsequently, an appropriate skin entry site for biopsy was determined and marked. The operative field  was prepped with chlorhexidinein a sterile fashion, and a sterile drape was applied covering the operative field. A sterile gown and sterile gloves were used for the procedure. Local anesthesia was provided with 1% Lidocaine. Under CT fluoroscopic guidance, a 17 gauge trocar needle was advanced to the margin of the left mesenteric lesion. Once needle tip position was confirmed, coaxial 18-gauge core biopsy samples were obtained, submitted in saline to surgical pathology. The guide needle was removed. The patient tolerated the procedure well. COMPLICATIONS: None immediate FINDINGS: Initial CT demonstrated moderate bilateral pleural effusions and moderate abdominal ascites. Omental caking and the left mesenteric mass again localized corresponding to findings on recent PET-CT. Enlarged pericardial and retrocrural lymph nodes again noted. Because of the re- accumulation of ascites, paracentesis was performed previous deep core biopsy removing 2 L of chylous fluid. CT guided core biopsy of the left mesenteric mass was then performed without complication. IMPRESSION: 1. Paracentesis with CT guidance removing 2 L, sample sent for cytology analysis. 2. Technically successful CT-guided core biopsy of  left mesenteric mass. Electronically Signed   By: Lucrezia Europe M.D.   On: 04/01/2017 12:15   Ir Fluoro Guide Port Insertion Right  Result Date: 04/11/2017 CLINICAL DATA:  Diffuse large B-cell lymphoma and need for porta cath to begin chemotherapy. EXAM: IMPLANTED PORT A CATH PLACEMENT WITH ULTRASOUND AND FLUOROSCOPIC GUIDANCE ANESTHESIA/SEDATION: 2.0 mg IV Versed; 100 mcg IV Fentanyl Total Moderate Sedation Time:  40 minutes The patient's level of consciousness and physiologic status were continuously monitored during the procedure by Radiology nursing. Additional Medications: 2 g IV Ancef. FLUOROSCOPY TIME:  30 seconds.  21 mGy. PROCEDURE: The procedure, risks, benefits, and alternatives were explained to the patient.  Questions regarding the procedure were encouraged and answered. The patient understands and consents to the procedure. A time-out was performed prior to initiating the procedure. Ultrasound was utilized to confirm patency of the right internal jugular vein. The right neck and chest were prepped with chlorhexidine in a sterile fashion, and a sterile drape was applied covering the operative field. Maximum barrier sterile technique with sterile gowns and gloves were used for the procedure. Local anesthesia was provided with 1% lidocaine. After creating a small venotomy incision, a 21 gauge needle was advanced into the right internal jugular vein under direct, real-time ultrasound guidance. Ultrasound image documentation was performed. After securing guidewire access, an 8 Fr dilator was placed. A J-wire was kinked to measure appropriate catheter length. A subcutaneous port pocket was then created along the upper chest wall utilizing sharp and blunt dissection. Portable cautery was utilized. The pocket was irrigated with sterile saline. A single lumen power injectable port was chosen for placement. The 8 Fr catheter was tunneled from the port pocket site to the venotomy incision. The port was placed in the pocket. External catheter was trimmed to appropriate length based on guidewire measurement. At the venotomy, an 8 Fr peel-away sheath was placed over a guidewire. The catheter was then placed through the sheath and the sheath removed. Final catheter positioning was confirmed and documented with a fluoroscopic spot image. The port was accessed with a needle and aspirated and flushed with heparinized saline. The access needle was removed. The venotomy and port pocket incisions were closed with subcutaneous 4-0 Monocryl and subcuticular 4-0 Vicryl. Dermabond was applied to both incisions. COMPLICATIONS: COMPLICATIONS None FINDINGS: After catheter placement, the tip lies at the cavo-atrial junction. The catheter aspirates  normally and is ready for immediate use. IMPRESSION: Placement of single lumen port a cath via right internal jugular vein. The catheter tip lies at the cavo-atrial junction. A power injectable port a cath was placed and is ready for immediate use. Electronically Signed   By: Aletta Edouard M.D.   On: 04/11/2017 13:49     Assessment and plan- Patient is a 66 y.o. 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 I will therefore like to continue our CHOP without making any changes at this time. I will see him back on 05/08/2017 to check his labs and he will proceed with our CHOP chemotherapy with more premeds on 05/09/2017 with Neulasta support. This will be his cycle #2. I will plan to get interim PET CT scan after 3 cycles of R CHOP. Clinically patient is doing well and his abdomen seems less distended today likely because his ascites is responding to treatment  Rtc in 2 weeks with labs   Visit Diagnosis  1. 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 Randa Evens, MD, MPH [facility] Pager- [phone] 04/25/2017 8:27 AM

## 2017-04-29 ENCOUNTER — Ambulatory Visit
Admission: RE | Admit: 2017-04-29 | Discharge: 2017-04-29 | Disposition: A | Discharge status: Home / Self Care | Payer: BLUE CROSS/BLUE SHIELD | Source: Ambulatory Visit | Attending: Oncology | Admitting: Oncology

## 2017-04-29 DIAGNOSIS — C8333 Diffuse large B-cell lymphoma, intra-abdominal lymph nodes: Secondary | ICD-10-CM

## 2017-04-29 MED ORDER — TECHNETIUM TC 99M-LABELED RED BLOOD CELLS IV KIT
22.5300 | PACK | Freq: Once | INTRAVENOUS | Status: AC | PRN
  Administered 2017-04-29: 22.53 via INTRAVENOUS

## 2017-05-01 ENCOUNTER — Encounter (HOSPITAL_COMMUNITY): Payer: Self-pay

## 2017-05-05 LAB — CHROMOSOME ANALYSIS, BONE MARROW

## 2017-05-08 ENCOUNTER — Encounter: Payer: Self-pay | Admitting: Oncology

## 2017-05-08 ENCOUNTER — Ambulatory Visit: Payer: BLUE CROSS/BLUE SHIELD

## 2017-05-08 ENCOUNTER — Inpatient Hospital Stay: Payer: BLUE CROSS/BLUE SHIELD | Attending: Oncology | Admitting: Oncology

## 2017-05-08 ENCOUNTER — Telehealth: Payer: Self-pay | Admitting: Pharmacist

## 2017-05-08 ENCOUNTER — Inpatient Hospital Stay: Payer: BLUE CROSS/BLUE SHIELD

## 2017-05-08 VITALS — BP 157/94 | HR 81 | Temp 98.4°F | Resp 18 | Ht 70.0 in | Wt 277.5 lb

## 2017-05-08 DIAGNOSIS — K760 Fatty (change of) liver, not elsewhere classified: Secondary | ICD-10-CM | POA: Diagnosis not present

## 2017-05-08 DIAGNOSIS — L409 Psoriasis, unspecified: Secondary | ICD-10-CM | POA: Diagnosis not present

## 2017-05-08 DIAGNOSIS — K219 Gastro-esophageal reflux disease without esophagitis: Secondary | ICD-10-CM | POA: Diagnosis not present

## 2017-05-08 DIAGNOSIS — I10 Essential (primary) hypertension: Secondary | ICD-10-CM | POA: Diagnosis not present

## 2017-05-08 DIAGNOSIS — Z7689 Persons encountering health services in other specified circumstances: Secondary | ICD-10-CM | POA: Insufficient documentation

## 2017-05-08 DIAGNOSIS — Z79899 Other long term (current) drug therapy: Secondary | ICD-10-CM

## 2017-05-08 DIAGNOSIS — Z7952 Long term (current) use of systemic steroids: Secondary | ICD-10-CM | POA: Diagnosis not present

## 2017-05-08 DIAGNOSIS — Z5181 Encounter for therapeutic drug level monitoring: Secondary | ICD-10-CM

## 2017-05-08 DIAGNOSIS — E785 Hyperlipidemia, unspecified: Secondary | ICD-10-CM | POA: Insufficient documentation

## 2017-05-08 DIAGNOSIS — M1990 Unspecified osteoarthritis, unspecified site: Secondary | ICD-10-CM | POA: Insufficient documentation

## 2017-05-08 DIAGNOSIS — Z5111 Encounter for antineoplastic chemotherapy: Secondary | ICD-10-CM | POA: Diagnosis not present

## 2017-05-08 DIAGNOSIS — N400 Enlarged prostate without lower urinary tract symptoms: Secondary | ICD-10-CM | POA: Insufficient documentation

## 2017-05-08 DIAGNOSIS — Z7982 Long term (current) use of aspirin: Secondary | ICD-10-CM | POA: Diagnosis not present

## 2017-05-08 DIAGNOSIS — R5383 Other fatigue: Secondary | ICD-10-CM | POA: Insufficient documentation

## 2017-05-08 DIAGNOSIS — Z8049 Family history of malignant neoplasm of other genital organs: Secondary | ICD-10-CM | POA: Diagnosis not present

## 2017-05-08 DIAGNOSIS — C8333 Diffuse large B-cell lymphoma, intra-abdominal lymph nodes: Secondary | ICD-10-CM | POA: Diagnosis not present

## 2017-05-08 DIAGNOSIS — I700 Atherosclerosis of aorta: Secondary | ICD-10-CM | POA: Insufficient documentation

## 2017-05-08 DIAGNOSIS — K7460 Unspecified cirrhosis of liver: Secondary | ICD-10-CM | POA: Diagnosis not present

## 2017-05-08 DIAGNOSIS — Z79620 Long term (current) use of immunosuppressive biologic: Secondary | ICD-10-CM

## 2017-05-08 DIAGNOSIS — M109 Gout, unspecified: Secondary | ICD-10-CM | POA: Diagnosis not present

## 2017-05-08 DIAGNOSIS — Z87891 Personal history of nicotine dependence: Secondary | ICD-10-CM | POA: Diagnosis not present

## 2017-05-08 LAB — CBC WITH DIFFERENTIAL/PLATELET
BASOS ABS: 0.1 10*3/uL (ref 0–0.1)
Basophils Relative: 1 %
Eosinophils Absolute: 0 10*3/uL (ref 0–0.7)
Eosinophils Relative: 1 %
HEMATOCRIT: 39.6 % — AB (ref 40.0–52.0)
Hemoglobin: 13.6 g/dL (ref 13.0–18.0)
LYMPHS PCT: 10 %
Lymphs Abs: 0.8 10*3/uL — ABNORMAL LOW (ref 1.0–3.6)
MCH: 29.3 pg (ref 26.0–34.0)
MCHC: 34.3 g/dL (ref 32.0–36.0)
MCV: 85.5 fL (ref 80.0–100.0)
MONO ABS: 0.7 10*3/uL (ref 0.2–1.0)
Monocytes Relative: 9 %
NEUTROS ABS: 6.3 10*3/uL (ref 1.4–6.5)
Neutrophils Relative %: 79 %
PLATELETS: 289 10*3/uL (ref 150–440)
RBC: 4.63 MIL/uL (ref 4.40–5.90)
RDW: 15.5 % — ABNORMAL HIGH (ref 11.5–14.5)
WBC: 7.9 10*3/uL (ref 3.8–10.6)

## 2017-05-08 LAB — COMPREHENSIVE METABOLIC PANEL
ALT: 16 U/L — AB (ref 17–63)
AST: 13 U/L — AB (ref 15–41)
Albumin: 4.1 g/dL (ref 3.5–5.0)
Alkaline Phosphatase: 56 U/L (ref 38–126)
Anion gap: 8 (ref 5–15)
BUN: 17 mg/dL (ref 6–20)
CHLORIDE: 104 mmol/L (ref 101–111)
CO2: 27 mmol/L (ref 22–32)
CREATININE: 0.89 mg/dL (ref 0.61–1.24)
Calcium: 9.1 mg/dL (ref 8.9–10.3)
GFR calc Af Amer: >60 mL/min (ref >60)
Glucose, Bld: 166 mg/dL — ABNORMAL HIGH (ref 65–99)
Potassium: 3.9 mmol/L (ref 3.5–5.1)
Sodium: 139 mmol/L (ref 135–145)
Total Bilirubin: 0.3 mg/dL (ref 0.3–1.2)
Total Protein: 7.3 g/dL (ref 6.5–8.1)

## 2017-05-08 NOTE — Telephone Encounter (Signed)
Due to a moderate reaction to Rituxan with Day 1 Cycle 1, Dr. Janese Banks asked that premedication orders be added for patient. She wanted to add the premedications that patient received on 04/18/17 added to the remaining cycles for this patient. I changed dexamethasone from 10mg  to 20mg  and added famotidine IVPB.

## 2017-05-08 NOTE — Progress Notes (Signed)
Pt had sob on exertion for 2-3 days but it subsided.  He is eating well but lost 4 lbs.

## 2017-05-08 NOTE — Progress Notes (Signed)
Hematology/Oncology Consult note [facility]  [phone] [phone]  Patient Care Team: [name]., MD as PCP - General (Family Medicine) [name], MD as Consulting Physician (Pulmonary Disease)   Name of the patient: Peter Ryan  [id number]  May 28, 1951   Date of visit: 05/08/17  Diagnosis- Stage IV DLBCL GCB IPI score- low intermediate risk  Chief complaint/ Reason for visit- on treatment assessment prior to cycle 2 of RCHOP  Heme/Onc history:1. Patient is a 66 year old male who initially presented with symptoms of worsening shortness of breathand was found to have right pleural effusion on chest x-ray. CT chest with contrast on 11/12/2016 revealed multiple lymph nodes throughout the mediastinum measuring approximately 1 cm in short axis. Upper abdomen showed evidence of ascites and mild cirrhotic changes in the liver. Patient underwent thoracocentesis for recurrent right pleural effusion. Fluid was exudative in nature and LDH in the pleural fluid was elevated. Cytology was negative for malignancy. Patient also had peripheral flow cytometry done which was negative for malignancy. Pleural and peritoneal fluid cytology has been negative for malignancy/ lymphoma  2. Ultrasound of the abdomen on 01/24/2017 showed sludge in the gallbladder and parenchymal of the liver suggesting cirrhosis as well as moderate ascites. Initially his ascites with attribute it to his cirrhosis and nonalcoholic fatty liver disease. He was also treated for culture negative neutrophilic SBP based on asciticfluid analysis. Given that the ascites fluid was exudative in nature which is not consistent with cirrhosis from nonalcoholic fatty liver disease as well as no evidence of splenomegaly or abnormal LFTs, CT abdomen was obtained to rule out any other etiology for ascites  3. CT abdomen on 03/21/2017 showed: IMPRESSION: 1. Bulky central mesenteric  mass lesion encasing major mesenteric vascular anatomy without substantial mass-effect. Diffuse omental caking is evident and peritoneal enhancement is identified. There is associated mesenteric, juxta diaphragmatic, retroperitoneal, and retrocrural lymphadenopathy. Lymphoma would be a consideration. Metastatic disease could also have this appearance. 2. Small a moderate volume ascites  4. Patient denies any unintentional weight loss. His appetite is fair denies any drenching night sweats or recurrent fevers. Does report fatigue  5. PET CT showed: IMPRESSION: 1. Large hypermetabolic left eccentric mesenteric mass with extensive omental caking and peritoneal spread of tumor. This mechanism of spread would seem to favor a gastrointestinal primary, with entities such as carcinoid tumor or lymphoma as differential diagnostic considerations. Tissue diagnosis recommended. 2. Hypermetabolic mediastinal adenopathy compatible with malignancy. Moderate to large bilateral pleural effusions with passive atelectasis and low-grade activity along the pleural fluid-early malignant pleural effusions not excluded. 3. Aortic Atherosclerosis (ICD10-I70.0). 4. No definite involvement of the neck or skeleton. 5. Moderately enlarged prostate gland.  6. Omental biopsy showed: DIAGNOSIS:  A. SOFT TISSUE MASS, LEFT MESENTERY; CT-GUIDED CORE BIOPSY:  - LARGE B-CELL LYMPHOMA, CD10 POSITIVE.   Comment:  There is a proliferation of neoplastic lymphoid cells in a sclerotic  background. The infiltrate is predominantly diffuse, with some areas  containing small nodules. The cytologic features are similar in all  areas and the nodularity may be an artifact of the sclerosis. The  lymphoid cells are large, with irregular nuclei, small nucleoli, and  pale cytoplasm. The flow cytometry and immunohistochemistry (IHC)  results are consistent with diffuse large B-cell lymphoma, not otherwise  specified, germinal  center B-cell type, but final classification will be  based on the results of FISH analysis for MYC, BCL2, and BCL6 gene  rearrangements. Based on this sample, a  coexisting follicular lymphoma  cannot be excluded  FISH testing for BCL 6 and cmycwas negative. Therefore he does not have a double hit lymphoma. Bone marrow biopsy was negative for lymphoma. He did not meet criteria for cns prophylaxis. His only risk factors being age and stage IV    Interval history- reports some fatigue. Overall feels better. Sob and abdominal distension much improved  ECOG PS- 0 Pain scale- 0   Review of systems- Review of Systems  Constitutional: Negative for chills, fever, malaise/fatigue and weight loss.  HENT: Negative for congestion, ear discharge and nosebleeds.   Eyes: Negative for blurred vision.  Respiratory: Negative for cough, hemoptysis, sputum production, shortness of breath and wheezing.   Cardiovascular: Negative for chest pain, palpitations, orthopnea and claudication.  Gastrointestinal: Negative for abdominal pain, blood in stool, constipation, diarrhea, heartburn, melena, nausea and vomiting.  Genitourinary: Negative for dysuria, flank pain, frequency, hematuria and urgency.  Musculoskeletal: Negative for back pain, joint pain and myalgias.  Skin: Negative for rash.  Neurological: Negative for dizziness, tingling, focal weakness, seizures, weakness and headaches.  Endo/Heme/Allergies: Does not bruise/bleed easily.  Psychiatric/Behavioral: Negative for depression and suicidal ideas. The patient does not have insomnia.       Allergies  Allergen Reactions  . Metformin Other (See Comments)    Stomach upset      Past Medical History:  Diagnosis Date  . Arthritis   . BPH (benign prostatic hyperplasia)   . Chronic prostatitis   . Cirrhosis of liver ([place])   . Dyslipidemia   . Elevated PSA   . Gastric reflux   . Gout   . HTN (hypertension)   . Over weight   . Psoriasis     . Testicular pain, right   . Urinary frequency      Past Surgical History:  Procedure Laterality Date  . hydrocelectomy    . IR FLUORO GUIDE PORT INSERTION RIGHT  04/11/2017  . PILONIDAL CYST EXCISION      Social History   Social History  . Marital status: Married    Spouse name: N/A  . Number of children: N/A  . Years of education: N/A   Occupational History  . Not on file.   Social History Main Topics  . Smoking status: Former Smoker    Packs/day: 1.50    Years: 8.00    Types: Cigarettes  . Smokeless tobacco: Never Used     Comment: quit 40 years ago  . Alcohol use No     Comment: rarely  . Drug use: No  . Sexual activity: Not on file   Other Topics Concern  . Not on file   Social History Narrative  . No narrative on file    Family History  Problem Relation Age of Onset  . Uterine cancer Mother   . Diabetes Mother   . Cancer Mother   . Cancer Maternal Uncle   . Bladder Cancer Neg Hx   . Kidney disease Neg Hx   . Prostate cancer Neg Hx      Current Outpatient Prescriptions:  .  allopurinol (ZYLOPRIM) 300 MG tablet, Take 1 tablet (300 mg total) by mouth daily., Disp: 30 tablet, Rfl: 3 .  amLODipine (NORVASC) 10 MG tablet, TAKE ONE TABLET EVERY DAY, Disp: 30 tablet, Rfl: 6 .  aspirin EC 81 MG tablet, Take 81 mg by mouth daily. , Disp: , Rfl:  .  Cholecalciferol (VITAMIN D3) 1000 UNITS CAPS, Take 1,000 Units by mouth daily. , Disp: ,  Rfl:  .  finasteride (PROSCAR) 5 MG tablet, Take 1 tablet (5 mg total) by mouth daily., Disp: 90 tablet, Rfl: 4 .  lidocaine-prilocaine (EMLA) cream, Apply to affected area once, Disp: 30 g, Rfl: 3 .  LORazepam (ATIVAN) 0.5 MG tablet, Take 1 tablet (0.5 mg total) by mouth every 6 (six) hours as needed (Nausea or vomiting)., Disp: 30 tablet, Rfl: 0 .  losartan (COZAAR) 50 MG tablet, Take 1 tablet (50 mg total) by mouth daily., Disp: 90 tablet, Rfl: 3 .  ondansetron (ZOFRAN) 8 MG tablet, Take 1 tablet (8 mg total) by mouth 2  (two) times daily as needed for refractory nausea / vomiting. Start on day 3 after cyclophosphamide chemotherapy., Disp: 30 tablet, Rfl: 1 .  pantoprazole (PROTONIX) 20 MG tablet, Take 1 tablet (20 mg total) by mouth daily., Disp: 30 tablet, Rfl: 1 .  predniSONE (DELTASONE) 20 MG tablet, Take 5 tablets (100 mg total) by mouth daily. Take on days 1-5 of chemotherapy., Disp: 25 tablet, Rfl: 6 .  prochlorperazine (COMPAZINE) 10 MG tablet, Take 1 tablet (10 mg total) by mouth every 6 (six) hours as needed (Nausea or vomiting)., Disp: 30 tablet, Rfl: 6  Physical exam:  Vitals:   05/08/17 0846  BP: (!) 157/94  Pulse: 81  Resp: 18  Temp: 98.4 F (36.9 C)  TempSrc: Tympanic  Weight: 277 lb 8 oz (125.9 kg)  Height: '5\' 10"'$  (1.778 m)   Physical Exam  Constitutional: He is oriented to person, place, and time.  Obese. Appears in no acute distress  HENT:  Head: Normocephalic and atraumatic.  Eyes: Pupils are equal, round, and reactive to light. EOM are normal.  Neck: Normal range of motion.  Cardiovascular: Normal rate, regular rhythm and normal heart sounds.   Pulmonary/Chest: Effort normal and breath sounds normal.  Abdominal: Soft. Bowel sounds are normal.  Neurological: He is alert and oriented to person, place, and time.  Skin: Skin is warm and dry.     CMP Latest Ref Rng & Units 04/24/2017  Glucose 65 - 99 mg/dL 101(H)  BUN 6 - 20 mg/dL 19  Creatinine 0.61 - 1.24 mg/dL 0.95  Sodium 135 - 145 mmol/L 136  Potassium 3.5 - 5.1 mmol/L 3.7  Chloride 101 - 111 mmol/L 100(L)  CO2 22 - 32 mmol/L 30  Calcium 8.9 - 10.3 mg/dL 8.6(L)  Total Protein 6.5 - 8.1 g/dL 6.4(L)  Total Bilirubin 0.3 - 1.2 mg/dL 0.6  Alkaline Phos 38 - 126 U/L 59  AST 15 - 41 U/L 14(L)  ALT 17 - 63 U/L 35   CBC Latest Ref Rng & Units 04/24/2017  WBC 3.8 - 10.6 K/uL 1.6(L)  Hemoglobin 13.0 - 18.0 g/dL 13.1  Hematocrit 40.0 - 52.0 % 38.3(L)  Platelets 150 - 440 K/uL 144(L)    No images are attached to the  encounter.  Dg Chest 2 View  Result Date: 04/18/2017 CLINICAL DATA:  Worsened shortness of breath, history of B-cell lymphoma EXAM: CHEST  2 VIEW COMPARISON:  03/06/2017 FINDINGS: Cardiac shadow is enlarged. Right chest wall port is now seen in the mid superior vena cava. Bilateral small pleural effusions are noted with underlying basilar atelectasis/ infiltrate. No bony abnormality is noted. IMPRESSION: Bilateral pleural effusions right greater than left with associated basilar infiltrate/atelectasis. The overall appearance has worsened in the interval from the prior plain film. Electronically Signed   By: Inez Catalina M.D.   On: 04/18/2017 15:14   Nm Cardiac Muga Rest  Result  Date: 04/29/2017 CLINICAL DATA:  Diffuse large B-cell lymphoma. EXAM: NUCLEAR MEDICINE CARDIAC BLOOD POOL IMAGING (MUGA) TECHNIQUE: Cardiac multi-gated acquisition was performed at rest following intravenous injection of Tc-82mlabeled red blood cells. RADIOPHARMACEUTICALS:  22.5 mCi Tc-949mDP in-vitro labeled red blood cells IV COMPARISON:  None. FINDINGS: The left ventricular ejection fraction is equal to 61.1%. Normal left ventricular wall motion. IMPRESSION: 1. Left ventricular ejection fraction equals 61%. Normal left ventricular systolic function. Electronically Signed   By: TaKerby Moors.D.   On: 04/29/2017 15:51   Ct Bone Marrow Biopsy & Aspiration  Result Date: 04/11/2017 CLINICAL DATA:  Diffuse large B-cell lymphoma and need for bone marrow biopsy. EXAM: CT GUIDED BONE MARROW ASPIRATION AND BIOPSY ANESTHESIA/SEDATION: Versed 2.0 mg IV, Fentanyl 100 mcg IV Total Moderate Sedation Time: 20 minutes. The patient's level of consciousness and physiologic status were continuously monitored during the procedure by Radiology nursing. PROCEDURE: The procedure risks, benefits, and alternatives were explained to the patient. Questions regarding the procedure were encouraged and answered. The patient understands and consents to  the procedure. A time out was performed prior to initiating the procedure. The right gluteal region was prepped with chlorhexidine. Sterile gown and sterile gloves were used for the procedure. Local anesthesia was provided with 1% Lidocaine. Under CT guidance, an 11 gauge On Control bone cutting needle was advanced from a posterior approach into the right iliac bone. Needle positioning was confirmed with CT. Initial non heparinized and heparinized aspirate samples were obtained of bone marrow. Core biopsy was performed via the On Control drill needle. COMPLICATIONS: None FINDINGS: Inspection of initial aspirate did reveal visible particles. Intact core biopsy sample was obtained. IMPRESSION: CT guided bone marrow biopsy of right posterior iliac bone with both aspirate and core samples obtained. Electronically Signed   By: GlAletta Edouard.D.   On: 04/11/2017 11:39   Ir Fluoro Guide Port Insertion Right  Result Date: 04/11/2017 CLINICAL DATA:  Diffuse large B-cell lymphoma and need for porta cath to begin chemotherapy. EXAM: IMPLANTED PORT A CATH PLACEMENT WITH ULTRASOUND AND FLUOROSCOPIC GUIDANCE ANESTHESIA/SEDATION: 2.0 mg IV Versed; 100 mcg IV Fentanyl Total Moderate Sedation Time:  40 minutes The patient's level of consciousness and physiologic status were continuously monitored during the procedure by Radiology nursing. Additional Medications: 2 g IV Ancef. FLUOROSCOPY TIME:  30 seconds.  21 mGy. PROCEDURE: The procedure, risks, benefits, and alternatives were explained to the patient. Questions regarding the procedure were encouraged and answered. The patient understands and consents to the procedure. A time-out was performed prior to initiating the procedure. Ultrasound was utilized to confirm patency of the right internal jugular vein. The right neck and chest were prepped with chlorhexidine in a sterile fashion, and a sterile drape was applied covering the operative field. Maximum barrier sterile  technique with sterile gowns and gloves were used for the procedure. Local anesthesia was provided with 1% lidocaine. After creating a small venotomy incision, a 21 gauge needle was advanced into the right internal jugular vein under direct, real-time ultrasound guidance. Ultrasound image documentation was performed. After securing guidewire access, an 8 Fr dilator was placed. A J-wire was kinked to measure appropriate catheter length. A subcutaneous port pocket was then created along the upper chest wall utilizing sharp and blunt dissection. Portable cautery was utilized. The pocket was irrigated with sterile saline. A single lumen power injectable port was chosen for placement. The 8 Fr catheter was tunneled from the port pocket site to the venotomy incision. The port  was placed in the pocket. External catheter was trimmed to appropriate length based on guidewire measurement. At the venotomy, an 8 Fr peel-away sheath was placed over a guidewire. The catheter was then placed through the sheath and the sheath removed. Final catheter positioning was confirmed and documented with a fluoroscopic spot image. The port was accessed with a needle and aspirated and flushed with heparinized saline. The access needle was removed. The venotomy and port pocket incisions were closed with subcutaneous 4-0 Monocryl and subcuticular 4-0 Vicryl. Dermabond was applied to both incisions. COMPLICATIONS: COMPLICATIONS None FINDINGS: After catheter placement, the tip lies at the cavo-atrial junction. The catheter aspirates normally and is ready for immediate use. IMPRESSION: Placement of single lumen port a cath via right internal jugular vein. The catheter tip lies at the cavo-atrial junction. A power injectable port a cath was placed and is ready for immediate use. Electronically Signed   By: Aletta Edouard M.D.   On: 04/11/2017 13:49     Assessment and plan- Patient is a 66 y.o. male h/o Stage IV DLBCL GCB, not double hit, low  intermediate risk IPI here for on treatment assessment prior to cycle 2 of RCHOP chemotherapy   Counts ok to proceed with cycle 2 of RCHOP with neulasta support tomorrow. He will be getting some more pre meds including benadryl given that he had some flushing and sob with rituxan which eventually resolved.  He will rtc in 3 weeks and see me with cbc, cmp and ldh for cycle 3 of RCHOP. We will repeat scans after cycle 3.     Visit Diagnosis 1. 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 Encounter for antineoplastic chemotherapy   3. Encounter for monitoring rituximab therapy      Dr. Randa Evens, MD, MPH [facility] Pager- [phone] 05/08/2017 9:04 AM

## 2017-05-09 ENCOUNTER — Encounter: Payer: Self-pay | Admitting: *Deleted

## 2017-05-09 ENCOUNTER — Inpatient Hospital Stay: Payer: BLUE CROSS/BLUE SHIELD

## 2017-05-09 VITALS — BP 149/78 | HR 81 | Temp 98.0°F | Resp 20

## 2017-05-09 DIAGNOSIS — C8333 Diffuse large B-cell lymphoma, intra-abdominal lymph nodes: Secondary | ICD-10-CM

## 2017-05-09 MED ORDER — CYCLOPHOSPHAMIDE CHEMO INJECTION 2 GM
2000.0000 mg | Freq: Once | INTRAMUSCULAR | Status: AC
  Administered 2017-05-09: 2000 mg via INTRAVENOUS
  Filled 2017-05-09: qty 100

## 2017-05-09 MED ORDER — SODIUM CHLORIDE 0.9% FLUSH
10.0000 mL | INTRAVENOUS | Status: DC | PRN
  Administered 2017-05-09: 10 mL
  Filled 2017-05-09: qty 10

## 2017-05-09 MED ORDER — SODIUM CHLORIDE 0.9 % IV SOLN
375.0000 mg/m2 | Freq: Once | INTRAVENOUS | Status: AC
  Administered 2017-05-09: 1000 mg via INTRAVENOUS
  Filled 2017-05-09: qty 100

## 2017-05-09 MED ORDER — ACETAMINOPHEN 325 MG PO TABS
650.0000 mg | ORAL_TABLET | Freq: Once | ORAL | Status: AC
  Administered 2017-05-09: 650 mg via ORAL
  Filled 2017-05-09: qty 2

## 2017-05-09 MED ORDER — DOXORUBICIN HCL CHEMO IV INJECTION 2 MG/ML
50.0000 mg/m2 | Freq: Once | INTRAVENOUS | Status: AC
  Administered 2017-05-09: 128 mg via INTRAVENOUS
  Filled 2017-05-09: qty 64

## 2017-05-09 MED ORDER — DEXAMETHASONE SODIUM PHOSPHATE 100 MG/10ML IJ SOLN
20.0000 mg | Freq: Once | INTRAMUSCULAR | Status: AC
  Administered 2017-05-09: 20 mg via INTRAVENOUS
  Filled 2017-05-09: qty 2

## 2017-05-09 MED ORDER — VINCRISTINE SULFATE CHEMO INJECTION 1 MG/ML
2.0000 mg | Freq: Once | INTRAVENOUS | Status: AC
  Administered 2017-05-09: 2 mg via INTRAVENOUS
  Filled 2017-05-09: qty 2

## 2017-05-09 MED ORDER — DIPHENHYDRAMINE HCL 25 MG PO CAPS
50.0000 mg | ORAL_CAPSULE | Freq: Once | ORAL | Status: AC
Start: 2017-05-09 — End: 2017-05-09
  Administered 2017-05-09: 50 mg via ORAL
  Filled 2017-05-09: qty 2

## 2017-05-09 MED ORDER — PEGFILGRASTIM 6 MG/0.6ML ~~LOC~~ PSKT
6.0000 mg | PREFILLED_SYRINGE | Freq: Once | SUBCUTANEOUS | Status: AC
  Administered 2017-05-09: 6 mg via SUBCUTANEOUS
  Filled 2017-05-09: qty 0.6

## 2017-05-09 MED ORDER — FAMOTIDINE IN NACL 20-0.9 MG/50ML-% IV SOLN
20.0000 mg | Freq: Two times a day (BID) | INTRAVENOUS | Status: DC
  Administered 2017-05-09: 20 mg via INTRAVENOUS
  Filled 2017-05-09: qty 50

## 2017-05-09 MED ORDER — PALONOSETRON HCL INJECTION 0.25 MG/5ML
0.2500 mg | Freq: Once | INTRAVENOUS | Status: AC
  Administered 2017-05-09: 0.25 mg via INTRAVENOUS
  Filled 2017-05-09: qty 5

## 2017-05-09 MED ORDER — SODIUM CHLORIDE 0.9 % IV SOLN
Freq: Once | INTRAVENOUS | Status: AC
Start: 2017-05-09 — End: 2017-05-09
  Administered 2017-05-09: 09:00:00 via INTRAVENOUS
  Filled 2017-05-09: qty 1000

## 2017-05-09 MED ORDER — HEPARIN SOD (PORK) LOCK FLUSH 100 UNIT/ML IV SOLN
500.0000 [IU] | Freq: Once | INTRAVENOUS | Status: AC | PRN
  Administered 2017-05-09: 500 [IU]
  Filled 2017-05-09: qty 5

## 2017-05-13 ENCOUNTER — Telehealth: Payer: Self-pay | Admitting: *Deleted

## 2017-05-13 NOTE — Telephone Encounter (Signed)
Patient called and stated that his port is red and irritated since he got his chemo Friday. Please advise.

## 2017-05-13 NOTE — Telephone Encounter (Signed)
Patient will come in at 130 to see infusion nurses and then Dr Tasia Catchings if needed

## 2017-05-29 ENCOUNTER — Inpatient Hospital Stay (HOSPITAL_BASED_OUTPATIENT_CLINIC_OR_DEPARTMENT_OTHER): Payer: BLUE CROSS/BLUE SHIELD | Admitting: Oncology

## 2017-05-29 ENCOUNTER — Inpatient Hospital Stay: Payer: BLUE CROSS/BLUE SHIELD

## 2017-05-29 VITALS — BP 164/84 | HR 85 | Temp 97.6°F | Resp 18 | Wt 280.5 lb

## 2017-05-29 DIAGNOSIS — K219 Gastro-esophageal reflux disease without esophagitis: Secondary | ICD-10-CM

## 2017-05-29 DIAGNOSIS — M1990 Unspecified osteoarthritis, unspecified site: Secondary | ICD-10-CM | POA: Diagnosis not present

## 2017-05-29 DIAGNOSIS — I10 Essential (primary) hypertension: Secondary | ICD-10-CM | POA: Diagnosis not present

## 2017-05-29 DIAGNOSIS — L409 Psoriasis, unspecified: Secondary | ICD-10-CM | POA: Diagnosis not present

## 2017-05-29 DIAGNOSIS — E785 Hyperlipidemia, unspecified: Secondary | ICD-10-CM | POA: Diagnosis not present

## 2017-05-29 DIAGNOSIS — K7460 Unspecified cirrhosis of liver: Secondary | ICD-10-CM

## 2017-05-29 DIAGNOSIS — I700 Atherosclerosis of aorta: Secondary | ICD-10-CM | POA: Diagnosis not present

## 2017-05-29 DIAGNOSIS — M109 Gout, unspecified: Secondary | ICD-10-CM | POA: Diagnosis not present

## 2017-05-29 DIAGNOSIS — R5383 Other fatigue: Secondary | ICD-10-CM

## 2017-05-29 DIAGNOSIS — C8333 Diffuse large B-cell lymphoma, intra-abdominal lymph nodes: Secondary | ICD-10-CM

## 2017-05-29 DIAGNOSIS — Z5112 Encounter for antineoplastic immunotherapy: Secondary | ICD-10-CM

## 2017-05-29 DIAGNOSIS — Z7952 Long term (current) use of systemic steroids: Secondary | ICD-10-CM

## 2017-05-29 DIAGNOSIS — K760 Fatty (change of) liver, not elsewhere classified: Secondary | ICD-10-CM

## 2017-05-29 DIAGNOSIS — Z5111 Encounter for antineoplastic chemotherapy: Secondary | ICD-10-CM

## 2017-05-29 DIAGNOSIS — Z87891 Personal history of nicotine dependence: Secondary | ICD-10-CM

## 2017-05-29 DIAGNOSIS — Z7982 Long term (current) use of aspirin: Secondary | ICD-10-CM

## 2017-05-29 DIAGNOSIS — N400 Enlarged prostate without lower urinary tract symptoms: Secondary | ICD-10-CM

## 2017-05-29 DIAGNOSIS — Z79899 Other long term (current) drug therapy: Secondary | ICD-10-CM

## 2017-05-29 DIAGNOSIS — Z8049 Family history of malignant neoplasm of other genital organs: Secondary | ICD-10-CM

## 2017-05-29 LAB — COMPREHENSIVE METABOLIC PANEL
ALBUMIN: 4 g/dL (ref 3.5–5.0)
ALT: 15 U/L — ABNORMAL LOW (ref 17–63)
AST: 16 U/L (ref 15–41)
Alkaline Phosphatase: 55 U/L (ref 38–126)
Anion gap: 7 (ref 5–15)
BUN: 13 mg/dL (ref 6–20)
CHLORIDE: 103 mmol/L (ref 101–111)
CO2: 28 mmol/L (ref 22–32)
Calcium: 8.8 mg/dL — ABNORMAL LOW (ref 8.9–10.3)
Creatinine, Ser: 0.86 mg/dL (ref 0.61–1.24)
GFR calc Af Amer: >60 mL/min (ref >60)
GFR calc non Af Amer: >60 mL/min (ref >60)
GLUCOSE: 172 mg/dL — AB (ref 65–99)
POTASSIUM: 4 mmol/L (ref 3.5–5.1)
Sodium: 138 mmol/L (ref 135–145)
Total Bilirubin: 0.4 mg/dL (ref 0.3–1.2)
Total Protein: 7.2 g/dL (ref 6.5–8.1)

## 2017-05-29 LAB — CBC WITH DIFFERENTIAL/PLATELET
BASOS ABS: 0 10*3/uL (ref 0–0.1)
BASOS PCT: 1 %
EOS PCT: 1 %
Eosinophils Absolute: 0 10*3/uL (ref 0–0.7)
HCT: 38.3 % — ABNORMAL LOW (ref 40.0–52.0)
Hemoglobin: 13.5 g/dL (ref 13.0–18.0)
Lymphocytes Relative: 10 %
Lymphs Abs: 0.7 10*3/uL — ABNORMAL LOW (ref 1.0–3.6)
MCH: 30.2 pg (ref 26.0–34.0)
MCHC: 35.2 g/dL (ref 32.0–36.0)
MCV: 85.6 fL (ref 80.0–100.0)
MONO ABS: 0.6 10*3/uL (ref 0.2–1.0)
Monocytes Relative: 9 %
NEUTROS ABS: 5.6 10*3/uL (ref 1.4–6.5)
Neutrophils Relative %: 81 %
PLATELETS: 218 10*3/uL (ref 150–440)
RBC: 4.48 MIL/uL (ref 4.40–5.90)
RDW: 15.9 % — AB (ref 11.5–14.5)
WBC: 7 10*3/uL (ref 3.8–10.6)

## 2017-05-29 LAB — LACTATE DEHYDROGENASE: LDH: 130 U/L (ref 98–192)

## 2017-05-29 NOTE — Progress Notes (Signed)
Hematology/Oncology Consult note [facility]  Telephone:([phone] [phone]  Patient Care Team: [name]., MD as PCP - General (Family Medicine) [name], MD as Consulting Physician (Pulmonary Disease)   Name of the patient: Peter Ryan  [id number]  [birth year]   Date of visit: 05/29/17  Diagnosis- Stage IV DLBCL GCB IPI score- low intermediate risk  Chief complaint/ Reason for visit- on treatment assessment prior to cycle 3 of RCHOP  Heme/Onc history:1. Patient is a 66 year old male who initially presented with symptoms of worsening shortness of breathand was found to have right pleural effusion on chest x-ray. CT chest with contrast on 11/12/2016 revealed multiple lymph nodes throughout the mediastinum measuring approximately 1 cm in short axis. Upper abdomen showed evidence of ascites and mild cirrhotic changes in the liver. Patient underwent thoracocentesis for recurrent right pleural effusion. Fluid was exudative in nature and LDH in the pleural fluid was elevated. Cytology was negative for malignancy. Patient also had peripheral flow cytometry done which was negative for malignancy. Pleural and peritoneal fluid cytology has been negative for malignancy/ lymphoma  2. Ultrasound of the abdomen on 01/24/2017 showed sludge in the gallbladder and parenchymal of the liver suggesting cirrhosis as well as moderate ascites. Initially his ascites with attribute it to his cirrhosis and nonalcoholic fatty liver disease. He was also treated for culture negative neutrophilic SBP based on asciticfluid analysis. Given that the ascites fluid was exudative in nature which is not consistent with cirrhosis from nonalcoholic fatty liver disease as well as no evidence of splenomegaly or abnormal LFTs, CT abdomen was obtained to rule out any other etiology for ascites  3. CT abdomen on 03/21/2017 showed: IMPRESSION: 1. Bulky central mesenteric  mass lesion encasing major mesenteric vascular anatomy without substantial mass-effect. Diffuse omental caking is evident and peritoneal enhancement is identified. There is associated mesenteric, juxta diaphragmatic, retroperitoneal, and retrocrural lymphadenopathy. Lymphoma would be a consideration. Metastatic disease could also have this appearance. 2. Small a moderate volume ascites  4. Patient denies any unintentional weight loss. His appetite is fair denies any drenching night sweats or recurrent fevers. Does report fatigue  5. PET CT showed: IMPRESSION: 1. Large hypermetabolic left eccentric mesenteric mass with extensive omental caking and peritoneal spread of tumor. This mechanism of spread would seem to favor a gastrointestinal primary, with entities such as carcinoid tumor or lymphoma as differential diagnostic considerations. Tissue diagnosis recommended. 2. Hypermetabolic mediastinal adenopathy compatible with malignancy. Moderate to large bilateral pleural effusions with passive atelectasis and low-grade activity along the pleural fluid-early malignant pleural effusions not excluded. 3. Aortic Atherosclerosis (ICD10-I70.0). 4. No definite involvement of the neck or skeleton. 5. Moderately enlarged prostate gland.  6. Omental biopsy showed: DIAGNOSIS:  A. SOFT TISSUE MASS, LEFT MESENTERY; CT-GUIDED CORE BIOPSY:  - LARGE B-CELL LYMPHOMA, CD10 POSITIVE.   Comment:  There is a proliferation of neoplastic lymphoid cells in a sclerotic  background. The infiltrate is predominantly diffuse, with some areas  containing small nodules. The cytologic features are similar in all  areas and the nodularity may be an artifact of the sclerosis. The  lymphoid cells are large, with irregular nuclei, small nucleoli, and  pale cytoplasm. The flow cytometry and immunohistochemistry (IHC)  results are consistent with diffuse large B-cell lymphoma, not otherwise  specified, germinal  center B-cell type, but final classification will be  based on the results of FISH analysis for MYC, BCL2, and BCL6 gene  rearrangements. Based on this sample, a  coexisting follicular lymphoma  cannot be excluded  FISH testing for BCL 6 and cmycwas negative. Therefore he does not have a double hit lymphoma. Bone marrow biopsy was negative for lymphoma. Normal LDH. He did not meet criteria for cns prophylaxis. His only risk factors being age and stage IV   Interval history- tolerated last chemo well without significant side effects. Reports some fatigue. Denies other complaints.  ECOG PS- 0 Pain scale- 0   Review of systems- Review of Systems  Constitutional: Negative for chills, fever, malaise/fatigue and weight loss.  HENT: Negative for congestion, ear discharge and nosebleeds.   Eyes: Negative for blurred vision.  Respiratory: Negative for cough, hemoptysis, sputum production, shortness of breath and wheezing.   Cardiovascular: Negative for chest pain, palpitations, orthopnea and claudication.  Gastrointestinal: Negative for abdominal pain, blood in stool, constipation, diarrhea, heartburn, melena, nausea and vomiting.  Genitourinary: Negative for dysuria, flank pain, frequency, hematuria and urgency.  Musculoskeletal: Negative for back pain, joint pain and myalgias.  Skin: Negative for rash.  Neurological: Negative for dizziness, tingling, focal weakness, seizures, weakness and headaches.  Endo/Heme/Allergies: Does not bruise/bleed easily.  Psychiatric/Behavioral: Negative for depression and suicidal ideas. The patient does not have insomnia.       Allergies  Allergen Reactions  . Metformin Other (See Comments)    Stomach upset      Past Medical History:  Diagnosis Date  . Arthritis   . BPH (benign prostatic hyperplasia)   . Chronic prostatitis   . Cirrhosis of liver ([place])   . Dyslipidemia   . Elevated PSA   . Gastric reflux   . Gout   . HTN (hypertension)   .  Over weight   . Psoriasis   . Testicular pain, right   . Urinary frequency      Past Surgical History:  Procedure Laterality Date  . hydrocelectomy    . IR FLUORO GUIDE PORT INSERTION RIGHT  04/11/2017  . PILONIDAL CYST EXCISION      Social History   Social History  . Marital status: Married    Spouse name: N/A  . Number of children: N/A  . Years of education: N/A   Occupational History  . Not on file.   Social History Main Topics  . Smoking status: Former Smoker    Packs/day: 1.50    Years: 8.00    Types: Cigarettes  . Smokeless tobacco: Never Used     Comment: quit 40 years ago  . Alcohol use No     Comment: rarely  . Drug use: No  . Sexual activity: Not on file   Other Topics Concern  . Not on file   Social History Narrative  . No narrative on file    Family History  Problem Relation Age of Onset  . Uterine cancer Mother   . Diabetes Mother   . Cancer Mother   . Cancer Maternal Uncle   . Bladder Cancer Neg Hx   . Kidney disease Neg Hx   . Prostate cancer Neg Hx      Current Outpatient Prescriptions:  .  allopurinol (ZYLOPRIM) 300 MG tablet, Take 1 tablet (300 mg total) by mouth daily., Disp: 30 tablet, Rfl: 3 .  amLODipine (NORVASC) 10 MG tablet, TAKE ONE TABLET EVERY DAY, Disp: 30 tablet, Rfl: 6 .  aspirin EC 81 MG tablet, Take 81 mg by mouth daily. , Disp: , Rfl:  .  Cholecalciferol (VITAMIN D3) 1000 UNITS CAPS, Take 1,000 Units by mouth daily. ,  Disp: , Rfl:  .  finasteride (PROSCAR) 5 MG tablet, Take 1 tablet (5 mg total) by mouth daily., Disp: 90 tablet, Rfl: 4 .  lidocaine-prilocaine (EMLA) cream, Apply to affected area once, Disp: 30 g, Rfl: 3 .  loratadine (CLARITIN) 10 MG tablet, Take 10 mg by mouth daily as needed for allergies., Disp: , Rfl:  .  LORazepam (ATIVAN) 0.5 MG tablet, Take 1 tablet (0.5 mg total) by mouth every 6 (six) hours as needed (Nausea or vomiting)., Disp: 30 tablet, Rfl: 0 .  losartan (COZAAR) 50 MG tablet, Take 1 tablet  (50 mg total) by mouth daily., Disp: 90 tablet, Rfl: 3 .  ondansetron (ZOFRAN) 8 MG tablet, Take 1 tablet (8 mg total) by mouth 2 (two) times daily as needed for refractory nausea / vomiting. Start on day 3 after cyclophosphamide chemotherapy., Disp: 30 tablet, Rfl: 1 .  pantoprazole (PROTONIX) 20 MG tablet, Take 1 tablet (20 mg total) by mouth daily., Disp: 30 tablet, Rfl: 1 .  predniSONE (DELTASONE) 20 MG tablet, Take 5 tablets (100 mg total) by mouth daily. Take on days 1-5 of chemotherapy., Disp: 25 tablet, Rfl: 6 .  prochlorperazine (COMPAZINE) 10 MG tablet, Take 1 tablet (10 mg total) by mouth every 6 (six) hours as needed (Nausea or vomiting)., Disp: 30 tablet, Rfl: 6  Physical exam:  Vitals:   05/29/17 1007  BP: (!) 164/84  Pulse: 85  Resp: 18  Temp: 97.6 F (36.4 C)  TempSrc: Tympanic  Weight: 280 lb 8 oz (127.2 kg)   Physical Exam  Constitutional: He is oriented to person, place, and time and well-developed, well-nourished, and in no distress.  obese  HENT:  Head: Normocephalic and atraumatic.  Eyes: Pupils are equal, round, and reactive to light. EOM are normal.  Neck: Normal range of motion.  Cardiovascular: Normal rate, regular rhythm and normal heart sounds.   Pulmonary/Chest: Effort normal and breath sounds normal.  Abdominal: Soft. Bowel sounds are normal. He exhibits no distension. There is no tenderness.  Neurological: He is alert and oriented to person, place, and time.  Skin: Skin is warm and dry.     CMP Latest Ref Rng & Units 05/29/2017  Glucose 65 - 99 mg/dL 172(H)  BUN 6 - 20 mg/dL 13  Creatinine 0.61 - 1.24 mg/dL 0.86  Sodium 135 - 145 mmol/L 138  Potassium 3.5 - 5.1 mmol/L 4.0  Chloride 101 - 111 mmol/L 103  CO2 22 - 32 mmol/L 28  Calcium 8.9 - 10.3 mg/dL 8.8(L)  Total Protein 6.5 - 8.1 g/dL 7.2  Total Bilirubin 0.3 - 1.2 mg/dL 0.4  Alkaline Phos 38 - 126 U/L 55  AST 15 - 41 U/L 16  ALT 17 - 63 U/L 15(L)   CBC Latest Ref Rng & Units 05/29/2017    WBC 3.8 - 10.6 K/uL 7.0  Hemoglobin 13.0 - 18.0 g/dL 13.5  Hematocrit 40.0 - 52.0 % 38.3(L)  Platelets 150 - 440 K/uL 218    No images are attached to the encounter.  Nm Cardiac Muga Rest  Result Date: 04/29/2017 CLINICAL DATA:  Diffuse large B-cell lymphoma. EXAM: NUCLEAR MEDICINE CARDIAC BLOOD POOL IMAGING (MUGA) TECHNIQUE: Cardiac multi-gated acquisition was performed at rest following intravenous injection of Tc-61mlabeled red blood cells. RADIOPHARMACEUTICALS:  22.5 mCi [id number] in-vitro labeled red blood cells IV COMPARISON:  None. FINDINGS: The left ventricular ejection fraction is equal to 61.1%. Normal left ventricular wall motion. IMPRESSION: 1. Left ventricular ejection fraction equals 61%. Normal left  ventricular systolic function. Electronically Signed   By: Kerby Moors M.D.   On: 04/29/2017 15:51     Assessment and plan- Patient is a 66 y.o. male male h/o Stage IV DLBCL GCB, not double hit, low intermediaterisk IPI here for on treatment assessment prior to cycle 3 of RCHOP chemotherapy   Counts ok to proceed with cycle 3 of RCHOP with neulasta support tomorrow. He tolerated RCHOP cycle 2 well without significant side effects   He will rtc in 3 weeks and see me with cbc, cmp and ldh for cycle 4 of RCHOP. We get interim PET in 2 weeks. He plans to go for a wedding and will get cycle 4 on 06/23/17    Visit Diagnosis 1. 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 ([place])   2. Encounter for antineoplastic chemotherapy   3. Encounter for antineoplastic immunotherapy      Dr. Randa Evens, MD, MPH [facility] at [facility] Pager- [phone] 05/29/2017 10:52 AM

## 2017-05-29 NOTE — Addendum Note (Signed)
Addended by: Randa Evens C on: 05/29/2017 10:56 AM   Modules accepted: Orders

## 2017-05-29 NOTE — Progress Notes (Signed)
Here for follow up

## 2017-05-30 ENCOUNTER — Inpatient Hospital Stay: Payer: BLUE CROSS/BLUE SHIELD

## 2017-05-30 VITALS — BP 146/78 | HR 84 | Temp 98.4°F | Resp 18

## 2017-05-30 DIAGNOSIS — C8333 Diffuse large B-cell lymphoma, intra-abdominal lymph nodes: Secondary | ICD-10-CM | POA: Diagnosis not present

## 2017-05-30 MED ORDER — ACETAMINOPHEN 325 MG PO TABS
650.0000 mg | ORAL_TABLET | Freq: Once | ORAL | Status: AC
  Administered 2017-05-30: 650 mg via ORAL
  Filled 2017-05-30: qty 2

## 2017-05-30 MED ORDER — SODIUM CHLORIDE 0.9 % IV SOLN
2000.0000 mg | Freq: Once | INTRAVENOUS | Status: AC
  Administered 2017-05-30: 2000 mg via INTRAVENOUS
  Filled 2017-05-30: qty 100

## 2017-05-30 MED ORDER — DOXORUBICIN HCL CHEMO IV INJECTION 2 MG/ML
50.0000 mg/m2 | Freq: Once | INTRAVENOUS | Status: AC
  Administered 2017-05-30: 128 mg via INTRAVENOUS
  Filled 2017-05-30: qty 64

## 2017-05-30 MED ORDER — PEGFILGRASTIM 6 MG/0.6ML ~~LOC~~ PSKT
6.0000 mg | PREFILLED_SYRINGE | Freq: Once | SUBCUTANEOUS | Status: AC
  Administered 2017-05-30: 6 mg via SUBCUTANEOUS
  Filled 2017-05-30: qty 0.6

## 2017-05-30 MED ORDER — FAMOTIDINE IN NACL 20-0.9 MG/50ML-% IV SOLN
20.0000 mg | Freq: Two times a day (BID) | INTRAVENOUS | Status: DC
  Administered 2017-05-30: 20 mg via INTRAVENOUS
  Filled 2017-05-30: qty 50

## 2017-05-30 MED ORDER — SODIUM CHLORIDE 0.9 % IV SOLN
375.0000 mg/m2 | Freq: Once | INTRAVENOUS | Status: AC
  Administered 2017-05-30: 1000 mg via INTRAVENOUS
  Filled 2017-05-30: qty 100

## 2017-05-30 MED ORDER — SODIUM CHLORIDE 0.9% FLUSH
10.0000 mL | INTRAVENOUS | Status: DC | PRN
  Administered 2017-05-30: 10 mL
  Filled 2017-05-30: qty 10

## 2017-05-30 MED ORDER — SODIUM CHLORIDE 0.9 % IV SOLN
Freq: Once | INTRAVENOUS | Status: AC
Start: 2017-05-30 — End: 2017-05-30
  Administered 2017-05-30: 09:00:00 via INTRAVENOUS
  Filled 2017-05-30: qty 1000

## 2017-05-30 MED ORDER — HEPARIN SOD (PORK) LOCK FLUSH 100 UNIT/ML IV SOLN
500.0000 [IU] | Freq: Once | INTRAVENOUS | Status: AC | PRN
  Administered 2017-05-30: 500 [IU]
  Filled 2017-05-30: qty 5

## 2017-05-30 MED ORDER — DIPHENHYDRAMINE HCL 25 MG PO CAPS
50.0000 mg | ORAL_CAPSULE | Freq: Once | ORAL | Status: AC
  Administered 2017-05-30: 50 mg via ORAL
  Filled 2017-05-30: qty 2

## 2017-05-30 MED ORDER — PALONOSETRON HCL INJECTION 0.25 MG/5ML
0.2500 mg | Freq: Once | INTRAVENOUS | Status: AC
  Administered 2017-05-30: 0.25 mg via INTRAVENOUS
  Filled 2017-05-30: qty 5

## 2017-05-30 MED ORDER — DEXAMETHASONE SODIUM PHOSPHATE 100 MG/10ML IJ SOLN
20.0000 mg | Freq: Once | INTRAMUSCULAR | Status: AC
  Administered 2017-05-30: 20 mg via INTRAVENOUS
  Filled 2017-05-30: qty 2

## 2017-05-30 MED ORDER — VINCRISTINE SULFATE CHEMO INJECTION 1 MG/ML
2.0000 mg | Freq: Once | INTRAVENOUS | Status: AC
  Administered 2017-05-30: 2 mg via INTRAVENOUS
  Filled 2017-05-30: qty 2

## 2017-06-12 ENCOUNTER — Ambulatory Visit: Payer: BLUE CROSS/BLUE SHIELD

## 2017-06-17 ENCOUNTER — Encounter
Admission: RE | Admit: 2017-06-17 | Discharge: 2017-06-17 | Disposition: A | Discharge status: Home / Self Care | Payer: BLUE CROSS/BLUE SHIELD | Source: Ambulatory Visit | Attending: Oncology | Admitting: Oncology

## 2017-06-17 DIAGNOSIS — K5730 Diverticulosis of large intestine without perforation or abscess without bleeding: Secondary | ICD-10-CM | POA: Insufficient documentation

## 2017-06-17 DIAGNOSIS — I517 Cardiomegaly: Secondary | ICD-10-CM | POA: Insufficient documentation

## 2017-06-17 DIAGNOSIS — I700 Atherosclerosis of aorta: Secondary | ICD-10-CM | POA: Insufficient documentation

## 2017-06-17 DIAGNOSIS — C8333 Diffuse large B-cell lymphoma, intra-abdominal lymph nodes: Secondary | ICD-10-CM | POA: Insufficient documentation

## 2017-06-17 DIAGNOSIS — J90 Pleural effusion, not elsewhere classified: Secondary | ICD-10-CM | POA: Diagnosis not present

## 2017-06-17 LAB — GLUCOSE, CAPILLARY: GLUCOSE-CAPILLARY: 132 mg/dL — AB (ref 65–99)

## 2017-06-17 MED ORDER — FLUDEOXYGLUCOSE F - 18 (FDG) INJECTION
12.0000 | Freq: Once | INTRAVENOUS | Status: AC | PRN
  Administered 2017-06-17: 12.15 via INTRAVENOUS

## 2017-06-19 ENCOUNTER — Encounter: Payer: Self-pay | Admitting: Oncology

## 2017-06-19 ENCOUNTER — Inpatient Hospital Stay: Payer: BLUE CROSS/BLUE SHIELD | Attending: Oncology

## 2017-06-19 ENCOUNTER — Inpatient Hospital Stay (HOSPITAL_BASED_OUTPATIENT_CLINIC_OR_DEPARTMENT_OTHER): Payer: BLUE CROSS/BLUE SHIELD | Admitting: Oncology

## 2017-06-19 VITALS — BP 162/103 | HR 87 | Temp 98.2°F | Resp 16 | Ht 70.0 in | Wt 283.0 lb

## 2017-06-19 DIAGNOSIS — N400 Enlarged prostate without lower urinary tract symptoms: Secondary | ICD-10-CM | POA: Diagnosis not present

## 2017-06-19 DIAGNOSIS — R200 Anesthesia of skin: Secondary | ICD-10-CM | POA: Diagnosis not present

## 2017-06-19 DIAGNOSIS — J90 Pleural effusion, not elsewhere classified: Secondary | ICD-10-CM | POA: Diagnosis not present

## 2017-06-19 DIAGNOSIS — K219 Gastro-esophageal reflux disease without esophagitis: Secondary | ICD-10-CM

## 2017-06-19 DIAGNOSIS — Z79899 Other long term (current) drug therapy: Secondary | ICD-10-CM | POA: Insufficient documentation

## 2017-06-19 DIAGNOSIS — Z5111 Encounter for antineoplastic chemotherapy: Secondary | ICD-10-CM

## 2017-06-19 DIAGNOSIS — E785 Hyperlipidemia, unspecified: Secondary | ICD-10-CM | POA: Insufficient documentation

## 2017-06-19 DIAGNOSIS — R5383 Other fatigue: Secondary | ICD-10-CM | POA: Insufficient documentation

## 2017-06-19 DIAGNOSIS — C8333 Diffuse large B-cell lymphoma, intra-abdominal lymph nodes: Secondary | ICD-10-CM | POA: Diagnosis not present

## 2017-06-19 DIAGNOSIS — R202 Paresthesia of skin: Secondary | ICD-10-CM | POA: Diagnosis not present

## 2017-06-19 DIAGNOSIS — Z7982 Long term (current) use of aspirin: Secondary | ICD-10-CM | POA: Diagnosis not present

## 2017-06-19 DIAGNOSIS — I517 Cardiomegaly: Secondary | ICD-10-CM

## 2017-06-19 DIAGNOSIS — L409 Psoriasis, unspecified: Secondary | ICD-10-CM

## 2017-06-19 DIAGNOSIS — I119 Hypertensive heart disease without heart failure: Secondary | ICD-10-CM | POA: Diagnosis not present

## 2017-06-19 DIAGNOSIS — Z79620 Long term (current) use of immunosuppressive biologic: Secondary | ICD-10-CM

## 2017-06-19 DIAGNOSIS — K7460 Unspecified cirrhosis of liver: Secondary | ICD-10-CM

## 2017-06-19 DIAGNOSIS — R188 Other ascites: Secondary | ICD-10-CM

## 2017-06-19 DIAGNOSIS — R590 Localized enlarged lymph nodes: Secondary | ICD-10-CM

## 2017-06-19 DIAGNOSIS — K760 Fatty (change of) liver, not elsewhere classified: Secondary | ICD-10-CM | POA: Diagnosis not present

## 2017-06-19 DIAGNOSIS — Z87891 Personal history of nicotine dependence: Secondary | ICD-10-CM | POA: Insufficient documentation

## 2017-06-19 DIAGNOSIS — Z5181 Encounter for therapeutic drug level monitoring: Secondary | ICD-10-CM

## 2017-06-19 DIAGNOSIS — C786 Secondary malignant neoplasm of retroperitoneum and peritoneum: Secondary | ICD-10-CM

## 2017-06-19 DIAGNOSIS — Z7689 Persons encountering health services in other specified circumstances: Secondary | ICD-10-CM | POA: Insufficient documentation

## 2017-06-19 DIAGNOSIS — I700 Atherosclerosis of aorta: Secondary | ICD-10-CM | POA: Diagnosis not present

## 2017-06-19 LAB — CBC WITH DIFFERENTIAL/PLATELET
BASOS ABS: 0.1 10*3/uL (ref 0–0.1)
Basophils Relative: 1 %
Eosinophils Absolute: 0 10*3/uL (ref 0–0.7)
Eosinophils Relative: 1 %
HEMATOCRIT: 36.2 % — AB (ref 40.0–52.0)
HEMOGLOBIN: 12.8 g/dL — AB (ref 13.0–18.0)
Lymphocytes Relative: 9 %
Lymphs Abs: 0.7 10*3/uL — ABNORMAL LOW (ref 1.0–3.6)
MCH: 30.5 pg (ref 26.0–34.0)
MCHC: 35.3 g/dL (ref 32.0–36.0)
MCV: 86.4 fL (ref 80.0–100.0)
Monocytes Absolute: 0.6 10*3/uL (ref 0.2–1.0)
Monocytes Relative: 8 %
NEUTROS ABS: 6 10*3/uL (ref 1.4–6.5)
NEUTROS PCT: 81 %
Platelets: 264 10*3/uL (ref 150–440)
RBC: 4.19 MIL/uL — ABNORMAL LOW (ref 4.40–5.90)
RDW: 15.4 % — ABNORMAL HIGH (ref 11.5–14.5)
WBC: 7.4 10*3/uL (ref 3.8–10.6)

## 2017-06-19 LAB — COMPREHENSIVE METABOLIC PANEL
ALK PHOS: 52 U/L (ref 38–126)
ALT: 15 U/L — ABNORMAL LOW (ref 17–63)
AST: 18 U/L (ref 15–41)
Albumin: 4.1 g/dL (ref 3.5–5.0)
Anion gap: 9 (ref 5–15)
BILIRUBIN TOTAL: 0.5 mg/dL (ref 0.3–1.2)
BUN: 13 mg/dL (ref 6–20)
CALCIUM: 9.2 mg/dL (ref 8.9–10.3)
CO2: 25 mmol/L (ref 22–32)
Chloride: 104 mmol/L (ref 101–111)
Creatinine, Ser: 0.83 mg/dL (ref 0.61–1.24)
GFR calc Af Amer: >60 mL/min (ref >60)
GFR calc non Af Amer: >60 mL/min (ref >60)
GLUCOSE: 146 mg/dL — AB (ref 65–99)
POTASSIUM: 3.8 mmol/L (ref 3.5–5.1)
Sodium: 138 mmol/L (ref 135–145)
TOTAL PROTEIN: 7.4 g/dL (ref 6.5–8.1)

## 2017-06-19 NOTE — Progress Notes (Signed)
Hematology/Oncology Consult note [facility]  Telephone:([phone] [phone]  Patient Care Team: [name]., MD as PCP - General (Family Medicine) [name], MD as Consulting Physician (Pulmonary Disease)   Name of the patient: Peter Ryan  [id number]  Feb 20, 1951   Date of visit: 06/19/17  Diagnosis- Stage IV DLBCL GCB IPI score- low intermediate risk  Chief complaint/ Reason for visit- 1. on treatment assessment prior to cycle 4of RCHOP 2. Discuss interim PET/CT results  Heme/Onc history:1. Patient is a 66 year old male who initially presented with symptoms of worsening shortness of breathand was found to have right pleural effusion on chest x-ray. CT chest with contrast on 11/12/2016 revealed multiple lymph nodes throughout the mediastinum measuring approximately 1 cm in short axis. Upper abdomen showed evidence of ascites and mild cirrhotic changes in the liver. Patient underwent thoracocentesis for recurrent right pleural effusion. Fluid was exudative in nature and LDH in the pleural fluid was elevated. Cytology was negative for malignancy. Patient also had peripheral flow cytometry done which was negative for malignancy. Pleural and peritoneal fluid cytology has been negative for malignancy/ lymphoma  2. Ultrasound of the abdomen on 01/24/2017 showed sludge in the gallbladder and parenchymal of the liver suggesting cirrhosis as well as moderate ascites. Initially his ascites with attribute it to his cirrhosis and nonalcoholic fatty liver disease. He was also treated for culture negative neutrophilic SBP based on asciticfluid analysis. Given that the ascites fluid was exudative in nature which is not consistent with cirrhosis from nonalcoholic fatty liver disease as well as no evidence of splenomegaly or abnormal LFTs, CT abdomen was obtained to rule out any other etiology for ascites  3. CT abdomen on 03/21/2017 showed:  IMPRESSION: 1. Bulky central mesenteric mass lesion encasing major mesenteric vascular anatomy without substantial mass-effect. Diffuse omental caking is evident and peritoneal enhancement is identified. There is associated mesenteric, juxta diaphragmatic, retroperitoneal, and retrocrural lymphadenopathy. Lymphoma would be a consideration. Metastatic disease could also have this appearance. 2. Small a moderate volume ascites  4. Patient denies any unintentional weight loss. His appetite is fair denies any drenching night sweats or recurrent fevers. Does report fatigue  5. PET CT showed: IMPRESSION: 1. Large hypermetabolic left eccentric mesenteric mass with extensive omental caking and peritoneal spread of tumor. This mechanism of spread would seem to favor a gastrointestinal primary, with entities such as carcinoid tumor or lymphoma as differential diagnostic considerations. Tissue diagnosis recommended. 2. Hypermetabolic mediastinal adenopathy compatible with malignancy. Moderate to large bilateral pleural effusions with passive atelectasis and low-grade activity along the pleural fluid-early malignant pleural effusions not excluded. 3. Aortic Atherosclerosis (ICD10-I70.0). 4. No definite involvement of the neck or skeleton. 5. Moderately enlarged prostate gland.  6. Omental biopsy showed: DIAGNOSIS:  A. SOFT TISSUE MASS, LEFT MESENTERY; CT-GUIDED CORE BIOPSY:  - LARGE B-CELL LYMPHOMA, CD10 POSITIVE  FISH testing for BCL 6 and cmycwas negative. Therefore he does not have a double hit lymphoma. Bone marrow biopsy was negative for lymphoma. Normal LDH. He did not meet criteria for cns prophylaxis. His only risk factors being age and stage IV   Interval history- felt more fatigued after cycle 3 of RCHOP. However he continues to be active and go to work. Mild tingling numbness in his fingers  ECOG PS- 0 Pain scale- 0   Review of systems- Review of Systems  Constitutional:  Negative for chills, fever, malaise/fatigue and weight loss.  HENT: Negative for congestion, ear discharge and nosebleeds.  Eyes: Negative for blurred vision.  Respiratory: Negative for cough, hemoptysis, sputum production, shortness of breath and wheezing.   Cardiovascular: Negative for chest pain, palpitations, orthopnea and claudication.  Gastrointestinal: Negative for abdominal pain, blood in stool, constipation, diarrhea, heartburn, melena, nausea and vomiting.  Genitourinary: Negative for dysuria, flank pain, frequency, hematuria and urgency.  Musculoskeletal: Negative for back pain, joint pain and myalgias.  Skin: Negative for rash.  Neurological: Negative for dizziness, tingling, focal weakness, seizures, weakness and headaches.  Endo/Heme/Allergies: Does not bruise/bleed easily.  Psychiatric/Behavioral: Negative for depression and suicidal ideas. The patient does not have insomnia.       Allergies  Allergen Reactions  . Metformin Other (See Comments)    Stomach upset      Past Medical History:  Diagnosis Date  . Arthritis   . BPH (benign prostatic hyperplasia)   . Chronic prostatitis   . Cirrhosis of liver ([place])   . Dyslipidemia   . Elevated PSA   . Gastric reflux   . Gout   . HTN (hypertension)   . Over weight   . Psoriasis   . Testicular pain, right   . Urinary frequency      Past Surgical History:  Procedure Laterality Date  . hydrocelectomy    . IR FLUORO GUIDE PORT INSERTION RIGHT  04/11/2017  . PILONIDAL CYST EXCISION      Social History   Social History  . Marital status: Married    Spouse name: N/A  . Number of children: N/A  . Years of education: N/A   Occupational History  . Not on file.   Social History Main Topics  . Smoking status: Former Smoker    Packs/day: 1.50    Years: 8.00    Types: Cigarettes  . Smokeless tobacco: Never Used     Comment: quit 40 years ago  . Alcohol use No     Comment: rarely  . Drug use: No  . Sexual  activity: Not on file   Other Topics Concern  . Not on file   Social History Narrative  . No narrative on file    Family History  Problem Relation Age of Onset  . Uterine cancer Mother   . Diabetes Mother   . Cancer Mother   . Cancer Maternal Uncle   . Bladder Cancer Neg Hx   . Kidney disease Neg Hx   . Prostate cancer Neg Hx      Current Outpatient Prescriptions:  .  allopurinol (ZYLOPRIM) 300 MG tablet, Take 1 tablet (300 mg total) by mouth daily., Disp: 30 tablet, Rfl: 3 .  amLODipine (NORVASC) 10 MG tablet, TAKE ONE TABLET EVERY DAY, Disp: 30 tablet, Rfl: 6 .  aspirin EC 81 MG tablet, Take 81 mg by mouth daily. , Disp: , Rfl:  .  Cholecalciferol (VITAMIN D3) 1000 UNITS CAPS, Take 1,000 Units by mouth daily. , Disp: , Rfl:  .  diphenhydrAMINE (BENADRYL) 25 MG tablet, Take 25 mg by mouth at bedtime as needed., Disp: , Rfl:  .  finasteride (PROSCAR) 5 MG tablet, Take 1 tablet (5 mg total) by mouth daily., Disp: 90 tablet, Rfl: 4 .  lidocaine-prilocaine (EMLA) cream, Apply to affected area once, Disp: 30 g, Rfl: 3 .  loratadine (CLARITIN) 10 MG tablet, Take 10 mg by mouth daily as needed for allergies., Disp: , Rfl:  .  LORazepam (ATIVAN) 0.5 MG tablet, Take 1 tablet (0.5 mg total) by mouth every 6 (six) hours as needed (Nausea or vomiting). (  Patient not taking: Reported on 05/29/2017), Disp: 30 tablet, Rfl: 0 .  losartan (COZAAR) 50 MG tablet, Take 1 tablet (50 mg total) by mouth daily., Disp: 90 tablet, Rfl: 3 .  ondansetron (ZOFRAN) 8 MG tablet, Take 1 tablet (8 mg total) by mouth 2 (two) times daily as needed for refractory nausea / vomiting. Start on day 3 after cyclophosphamide chemotherapy. (Patient not taking: Reported on 05/29/2017), Disp: 30 tablet, Rfl: 1 .  pantoprazole (PROTONIX) 20 MG tablet, Take 1 tablet (20 mg total) by mouth daily. (Patient not taking: Reported on 05/29/2017), Disp: 30 tablet, Rfl: 1 .  predniSONE (DELTASONE) 20 MG tablet, Take 5 tablets (100 mg  total) by mouth daily. Take on days 1-5 of chemotherapy. (Patient not taking: Reported on 05/29/2017), Disp: 25 tablet, Rfl: 6 .  prochlorperazine (COMPAZINE) 10 MG tablet, Take 1 tablet (10 mg total) by mouth every 6 (six) hours as needed (Nausea or vomiting). (Patient not taking: Reported on 05/29/2017), Disp: 30 tablet, Rfl: 6  Physical exam:  Vitals:   06/19/17 0945 06/19/17 0954  BP: (!) 190/104 (!) 162/103  Pulse: 87   Resp: 16   Temp: 98.2 F (36.8 C)   TempSrc: Tympanic   Weight: 283 lb (128.4 kg)   Height: _0  (1.778 m)    Physical Exam  Constitutional: He is oriented to person, place, and time and well-developed, well-nourished, and in no distress.  HENT:  Head: Normocephalic and atraumatic.  Eyes: Pupils are equal, round, and reactive to light. EOM are normal.  Neck: Normal range of motion.  Cardiovascular: Normal rate, regular rhythm and normal heart sounds.   Pulmonary/Chest: Effort normal and breath sounds normal.  Abdominal: Soft. Bowel sounds are normal.  Neurological: He is alert and oriented to person, place, and time.  Skin: Skin is warm and dry.     CMP Latest Ref Rng & Units 06/19/2017  Glucose 65 - 99 mg/dL 146(H)  BUN 6 - 20 mg/dL 13  Creatinine 0.61 - 1.24 mg/dL 0.83  Sodium 135 - 145 mmol/L 138  Potassium 3.5 - 5.1 mmol/L 3.8  Chloride 101 - 111 mmol/L 104  CO2 22 - 32 mmol/L 25  Calcium 8.9 - 10.3 mg/dL 9.2  Total Protein 6.5 - 8.1 g/dL 7.4  Total Bilirubin 0.3 - 1.2 mg/dL 0.5  Alkaline Phos 38 - 126 U/L 52  AST 15 - 41 U/L 18  ALT 17 - 63 U/L 15(L)   CBC Latest Ref Rng & Units 06/19/2017  WBC 3.8 - 10.6 K/uL 7.4  Hemoglobin 13.0 - 18.0 g/dL 12.8(L)  Hematocrit 40.0 - 52.0 % 36.2(L)  Platelets 150 - 440 K/uL 264    No images are attached to the encounter.  Nm Pet Image Restag (ps) Skull Base To Thigh  Result Date: 06/17/2017 CLINICAL DATA:  Subsequent treatment strategy for diffuse large B-cell lymphoma. EXAM: NUCLEAR MEDICINE PET SKULL  BASE TO THIGH TECHNIQUE: 12.2 mCi F-18 FDG was injected intravenously. Full-ring PET imaging was performed from the skull base to thigh after the radiotracer. CT data was obtained and used for attenuation correction and anatomic localization. FASTING BLOOD GLUCOSE:  Value: 132 mg/dl COMPARISON:  03/28/2017 FINDINGS: NECK: No hypermetabolic lymph nodes in the neck. CHEST: The hypermetabolic 1.4 cm prevascular lymph node seen on the previous study has resolved in the interval measuring 5 mm short axis today with no evidence for FDG uptake above background mediastinal levels. The 6 mm short axis left internal mammary lymph node identified on the  previous study has resolved, measuring 2 mm short axis today no discernible hypermetabolism on PET imaging. Left lower paraesophageal lymph node identified on the previous study and measured at 8 mm short axis has decreased to 3- 4 mm short axis today with no FDG uptake above background mediastinal levels evident. The index 2.1 cm short axis pericardial lymph node identified on the prior study measures 0.8 cm today. FDG uptake in this region is below mediastinal levels. The moderate right pleural effusion is minimally smaller on today's exam. The small left pleural effusion seen previously has resolved. The right Port-A-Cath tip is positioned at the SVC/RA junction. Heart is enlarged. Insert calcium heart ABDOMEN/PELVIS: The large left mesenteric mass has decreased in the interval, but is difficult to measure as separate from small bowel given the lack of oral and intravenous contrast material. Measuring FDG uptake in this region is not possible and would not be accurate as small bowel accumulation would be included in the assessment. Prior study clearly showed FDG accumulation in the mesenteric mass well above background bowel levels in today there is no uptake above background bowel activity. Similarly, the diffuse omental and peritoneal hypermetabolism seen on the previous  study has improved with these areas now approaching background soft tissue uptake. Right retrocrural lymph node measured previously at 13 mm short axis is 7 mm short axis today and demonstrates SUV max = 2.8 compared to 13.9 previously. Diverticular changes noted left colon. There is abdominal aortic atherosclerosis without aneurysm. SKELETON: No focal hypermetabolic activity to suggest skeletal metastasis. IMPRESSION: 1. The large hypermetabolic left mesenteric mass has decreased in both size and hypermetabolism since the prior study. FDG accumulation is at or below small-bowel uptake on today's study. This lesion cannot be accurately measured on CT or PET imaging due to the immediately contiguous small bowel loops which blend into the lesion on this CT without oral or IV contrast. The diffuse omental/peritoneal disease seen on prior study show similar clear interval response to therapy. 2. The hypermetabolic mediastinal/pericardial, left internal mammary and retrocrural lymphadenopathy identified on the prior study has resolved with no enlarged lymph nodes at these locations today and no hypermetabolic FDG accumulation above background soft tissue/mediastinal levels. 3. Minimal decrease in moderate right pleural effusion with interval resolution of left pleural effusion. 4.  Aortic Atherosclerois (ICD10-170.0) Electronically Signed   By: Misty Stanley M.D.   On: 06/17/2017 11:41     Assessment and plan- Patient is a 66 y.o. male h/o Stage IV DLBCL GCB, not double hit, low intermediaterisk IPI here for on treatment assessment prior to cycle 4 of RCHOP chemotherapy   I have reviewed PET images independently and discussed findings with the patient. Large mesenteric mass has decreased in size and hypermetabolism. FDG at or below surrounding small bowel. Other areas of adenopathy noted on initial PET shows no hypermetabolism. He has had an excellent response to 3 cycles of RCHOP so far. Plan is now to complete 3  more cycles and repeat PET/CT after 6 cycles.  Counts ok to proceed with RCHOP with onpro neulasta on 9/2/418.  I will see him back in 3 weeks with cbc, cmp for cycle 5 of RCHOP with neulasta support    Visit Diagnosis 1. 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 ([place])   2. Encounter for antineoplastic chemotherapy   3. Encounter for monitoring rituximab therapy      Dr. Randa Evens, MD, MPH [facility] Pager- [phone] 06/19/2017 1:51 PM

## 2017-06-19 NOTE — Progress Notes (Signed)
Patient here for results. He is starting to experience peripheral neuropath. BP is elevated today.Marland Kitchen

## 2017-06-23 ENCOUNTER — Inpatient Hospital Stay: Payer: BLUE CROSS/BLUE SHIELD

## 2017-06-23 VITALS — BP 147/77 | HR 80 | Resp 18 | Wt 281.6 lb

## 2017-06-23 DIAGNOSIS — C8333 Diffuse large B-cell lymphoma, intra-abdominal lymph nodes: Secondary | ICD-10-CM | POA: Diagnosis not present

## 2017-06-23 MED ORDER — SODIUM CHLORIDE 0.9 % IV SOLN: 375.0000 mg/m2 | Freq: Once | INTRAVENOUS | Status: DC

## 2017-06-23 MED ORDER — PEGFILGRASTIM 6 MG/0.6ML ~~LOC~~ PSKT
6.0000 mg | PREFILLED_SYRINGE | Freq: Once | SUBCUTANEOUS | Status: AC
  Administered 2017-06-23: 6 mg via SUBCUTANEOUS
  Filled 2017-06-23: qty 0.6

## 2017-06-23 MED ORDER — DOXORUBICIN HCL CHEMO IV INJECTION 2 MG/ML
50.0000 mg/m2 | Freq: Once | INTRAVENOUS | Status: AC
  Administered 2017-06-23: 128 mg via INTRAVENOUS
  Filled 2017-06-23: qty 64

## 2017-06-23 MED ORDER — SODIUM CHLORIDE 0.9 % IV SOLN
2000.0000 mg | Freq: Once | INTRAVENOUS | Status: AC
  Administered 2017-06-23: 2000 mg via INTRAVENOUS
  Filled 2017-06-23: qty 100

## 2017-06-23 MED ORDER — SODIUM CHLORIDE 0.9 % IV SOLN
1000.0000 mg | Freq: Once | INTRAVENOUS | Status: AC
  Administered 2017-06-23: 1000 mg via INTRAVENOUS
  Filled 2017-06-23: qty 100

## 2017-06-23 MED ORDER — DIPHENHYDRAMINE HCL 25 MG PO CAPS
50.0000 mg | ORAL_CAPSULE | Freq: Once | ORAL | Status: AC
  Administered 2017-06-23: 50 mg via ORAL
  Filled 2017-06-23: qty 2

## 2017-06-23 MED ORDER — FAMOTIDINE IN NACL 20-0.9 MG/50ML-% IV SOLN
20.0000 mg | Freq: Two times a day (BID) | INTRAVENOUS | Status: DC
  Administered 2017-06-23: 20 mg via INTRAVENOUS
  Filled 2017-06-23: qty 50

## 2017-06-23 MED ORDER — SODIUM CHLORIDE 0.9 % IV SOLN
Freq: Once | INTRAVENOUS | Status: AC
  Administered 2017-06-23: 10:00:00 via INTRAVENOUS
  Filled 2017-06-23: qty 1000

## 2017-06-23 MED ORDER — HEPARIN SOD (PORK) LOCK FLUSH 100 UNIT/ML IV SOLN
500.0000 [IU] | Freq: Once | INTRAVENOUS | Status: AC | PRN
  Administered 2017-06-23: 500 [IU]
  Filled 2017-06-23: qty 5

## 2017-06-23 MED ORDER — PALONOSETRON HCL INJECTION 0.25 MG/5ML
0.2500 mg | Freq: Once | INTRAVENOUS | Status: AC
  Administered 2017-06-23: 0.25 mg via INTRAVENOUS
  Filled 2017-06-23: qty 5

## 2017-06-23 MED ORDER — DEXAMETHASONE SODIUM PHOSPHATE 100 MG/10ML IJ SOLN
20.0000 mg | Freq: Once | INTRAMUSCULAR | Status: AC
  Administered 2017-06-23: 20 mg via INTRAVENOUS
  Filled 2017-06-23: qty 2

## 2017-06-23 MED ORDER — SODIUM CHLORIDE 0.9 % IV SOLN
2.0000 mg | Freq: Once | INTRAVENOUS | Status: AC
  Administered 2017-06-23: 2 mg via INTRAVENOUS
  Filled 2017-06-23: qty 2

## 2017-06-23 MED ORDER — ACETAMINOPHEN 325 MG PO TABS
650.0000 mg | ORAL_TABLET | Freq: Once | ORAL | Status: AC
  Administered 2017-06-23: 650 mg via ORAL
  Filled 2017-06-23: qty 2

## 2017-07-02 ENCOUNTER — Telehealth: Payer: Self-pay | Admitting: *Deleted

## 2017-07-02 NOTE — Telephone Encounter (Signed)
Patient called to report that he is having GI upset. When questioning him, he admits that he is not taking his Protonix. I explained to him that prednisone causes GI irritation and he said he will take the Protonix and see if that helps

## 2017-07-10 ENCOUNTER — Other Ambulatory Visit: Payer: Self-pay | Admitting: *Deleted

## 2017-07-10 ENCOUNTER — Inpatient Hospital Stay: Payer: BLUE CROSS/BLUE SHIELD | Attending: Oncology | Admitting: Oncology

## 2017-07-10 ENCOUNTER — Inpatient Hospital Stay: Payer: BLUE CROSS/BLUE SHIELD

## 2017-07-10 ENCOUNTER — Encounter: Payer: Self-pay | Admitting: Oncology

## 2017-07-10 VITALS — BP 154/85 | HR 85 | Temp 98.2°F | Resp 16 | Wt 288.0 lb

## 2017-07-10 DIAGNOSIS — K219 Gastro-esophageal reflux disease without esophagitis: Secondary | ICD-10-CM | POA: Insufficient documentation

## 2017-07-10 DIAGNOSIS — Z8049 Family history of malignant neoplasm of other genital organs: Secondary | ICD-10-CM | POA: Insufficient documentation

## 2017-07-10 DIAGNOSIS — Z79899 Other long term (current) drug therapy: Secondary | ICD-10-CM | POA: Diagnosis not present

## 2017-07-10 DIAGNOSIS — L409 Psoriasis, unspecified: Secondary | ICD-10-CM | POA: Diagnosis not present

## 2017-07-10 DIAGNOSIS — C8333 Diffuse large B-cell lymphoma, intra-abdominal lymph nodes: Secondary | ICD-10-CM | POA: Insufficient documentation

## 2017-07-10 DIAGNOSIS — E785 Hyperlipidemia, unspecified: Secondary | ICD-10-CM | POA: Insufficient documentation

## 2017-07-10 DIAGNOSIS — Z79620 Long term (current) use of immunosuppressive biologic: Secondary | ICD-10-CM

## 2017-07-10 DIAGNOSIS — J90 Pleural effusion, not elsewhere classified: Secondary | ICD-10-CM | POA: Diagnosis not present

## 2017-07-10 DIAGNOSIS — N400 Enlarged prostate without lower urinary tract symptoms: Secondary | ICD-10-CM | POA: Insufficient documentation

## 2017-07-10 DIAGNOSIS — K7460 Unspecified cirrhosis of liver: Secondary | ICD-10-CM | POA: Diagnosis not present

## 2017-07-10 DIAGNOSIS — Z7982 Long term (current) use of aspirin: Secondary | ICD-10-CM | POA: Insufficient documentation

## 2017-07-10 DIAGNOSIS — I700 Atherosclerosis of aorta: Secondary | ICD-10-CM | POA: Insufficient documentation

## 2017-07-10 DIAGNOSIS — E669 Obesity, unspecified: Secondary | ICD-10-CM | POA: Insufficient documentation

## 2017-07-10 DIAGNOSIS — Z5181 Encounter for therapeutic drug level monitoring: Secondary | ICD-10-CM

## 2017-07-10 DIAGNOSIS — Z87891 Personal history of nicotine dependence: Secondary | ICD-10-CM | POA: Diagnosis not present

## 2017-07-10 DIAGNOSIS — R590 Localized enlarged lymph nodes: Secondary | ICD-10-CM | POA: Diagnosis not present

## 2017-07-10 DIAGNOSIS — Z5111 Encounter for antineoplastic chemotherapy: Secondary | ICD-10-CM | POA: Insufficient documentation

## 2017-07-10 DIAGNOSIS — R5381 Other malaise: Secondary | ICD-10-CM | POA: Insufficient documentation

## 2017-07-10 DIAGNOSIS — C786 Secondary malignant neoplasm of retroperitoneum and peritoneum: Secondary | ICD-10-CM | POA: Insufficient documentation

## 2017-07-10 DIAGNOSIS — M109 Gout, unspecified: Secondary | ICD-10-CM | POA: Insufficient documentation

## 2017-07-10 DIAGNOSIS — Z7689 Persons encountering health services in other specified circumstances: Secondary | ICD-10-CM | POA: Diagnosis not present

## 2017-07-10 DIAGNOSIS — R188 Other ascites: Secondary | ICD-10-CM | POA: Diagnosis not present

## 2017-07-10 DIAGNOSIS — I10 Essential (primary) hypertension: Secondary | ICD-10-CM | POA: Insufficient documentation

## 2017-07-10 DIAGNOSIS — R5383 Other fatigue: Secondary | ICD-10-CM | POA: Diagnosis not present

## 2017-07-10 LAB — COMPREHENSIVE METABOLIC PANEL
ALT: 16 U/L — AB (ref 17–63)
AST: 18 U/L (ref 15–41)
Albumin: 3.9 g/dL (ref 3.5–5.0)
Alkaline Phosphatase: 59 U/L (ref 38–126)
Anion gap: 11 (ref 5–15)
BUN: 14 mg/dL (ref 6–20)
CHLORIDE: 101 mmol/L (ref 101–111)
CO2: 26 mmol/L (ref 22–32)
Calcium: 9.1 mg/dL (ref 8.9–10.3)
Creatinine, Ser: 0.77 mg/dL (ref 0.61–1.24)
Glucose, Bld: 167 mg/dL — ABNORMAL HIGH (ref 65–99)
POTASSIUM: 3.9 mmol/L (ref 3.5–5.1)
SODIUM: 138 mmol/L (ref 135–145)
Total Bilirubin: 0.5 mg/dL (ref 0.3–1.2)
Total Protein: 7.2 g/dL (ref 6.5–8.1)

## 2017-07-10 LAB — CBC WITH DIFFERENTIAL/PLATELET
Basophils Absolute: 0.1 10*3/uL (ref 0–0.1)
Basophils Relative: 1 %
Eosinophils Absolute: 0 10*3/uL (ref 0–0.7)
Eosinophils Relative: 0 %
HCT: 36.8 % — ABNORMAL LOW (ref 40.0–52.0)
HEMOGLOBIN: 12.7 g/dL — AB (ref 13.0–18.0)
LYMPHS ABS: 0.7 10*3/uL — AB (ref 1.0–3.6)
LYMPHS PCT: 9 %
MCH: 30.5 pg (ref 26.0–34.0)
MCHC: 34.4 g/dL (ref 32.0–36.0)
MCV: 88.5 fL (ref 80.0–100.0)
Monocytes Absolute: 0.6 10*3/uL (ref 0.2–1.0)
Monocytes Relative: 8 %
NEUTROS PCT: 82 %
Neutro Abs: 6.4 10*3/uL (ref 1.4–6.5)
Platelets: 232 10*3/uL (ref 150–440)
RBC: 4.16 MIL/uL — AB (ref 4.40–5.90)
RDW: 15 % — ABNORMAL HIGH (ref 11.5–14.5)
WBC: 7.8 10*3/uL (ref 3.8–10.6)

## 2017-07-10 NOTE — Progress Notes (Signed)
Patient is here for labs and to see MD, with the intention of coming back tomorrow for treatment. He denies having any pain. He states that he has not had the Flu Vaccine yet, and is waiting for Dr. Janese Banks to give him the go ahead.

## 2017-07-10 NOTE — Progress Notes (Signed)
Hematology/Oncology Consult note [facility]  Telephone:([phone] Fax:([phone]  Patient Care Team: [name]., MD as PCP - General (Family Medicine) [name], MD as Consulting Physician (Pulmonary Disease)   Name of the patient: Peter Ryan  [id number]  05/08/1951   Date of visit: 07/10/17  Diagnosis- Stage IV DLBCL GCB IPI score- low intermediate risk  Chief complaint/ Reason for visit- 1. on treatment assessment prior to cycle 5of RCHOP   Heme/Onc history:1. Patient is a 66 year old male who initially presented with symptoms of worsening shortness of breathand was found to have right pleural effusion on chest x-ray. CT chest with contrast on 11/12/2016 revealed multiple lymph nodes throughout the mediastinum measuring approximately 1 cm in short axis. Upper abdomen showed evidence of ascites and mild cirrhotic changes in the liver. Patient underwent thoracocentesis for recurrent right pleural effusion. Fluid was exudative in nature and LDH in the pleural fluid was elevated. Cytology was negative for malignancy. Patient also had peripheral flow cytometry done which was negative for malignancy. Pleural and peritoneal fluid cytology has been negative for malignancy/ lymphoma  2. Ultrasound of the abdomen on 01/24/2017 showed sludge in the gallbladder and parenchymal of the liver suggesting cirrhosis as well as moderate ascites. Initially his ascites with attribute it to his cirrhosis and nonalcoholic fatty liver disease. He was also treated for culture negative neutrophilic SBP based on asciticfluid analysis. Given that the ascites fluid was exudative in nature which is not consistent with cirrhosis from nonalcoholic fatty liver disease as well as no evidence of splenomegaly or abnormal LFTs, CT abdomen was obtained to rule out any other etiology for ascites  3. CT abdomen on 03/21/2017 showed: IMPRESSION: 1. Bulky central  mesenteric mass lesion encasing major mesenteric vascular anatomy without substantial mass-effect. Diffuse omental caking is evident and peritoneal enhancement is identified. There is associated mesenteric, juxta diaphragmatic, retroperitoneal, and retrocrural lymphadenopathy. Lymphoma would be a consideration. Metastatic disease could also have this appearance. 2. Small a moderate volume ascites  4. Patient denies any unintentional weight loss. His appetite is fair denies any drenching night sweats or recurrent fevers. Does report fatigue  5. PET CT showed: IMPRESSION: 1. Large hypermetabolic left eccentric mesenteric mass with extensive omental caking and peritoneal spread of tumor. This mechanism of spread would seem to favor a gastrointestinal primary, with entities such as carcinoid tumor or lymphoma as differential diagnostic considerations. Tissue diagnosis recommended. 2. Hypermetabolic mediastinal adenopathy compatible with malignancy. Moderate to large bilateral pleural effusions with passive atelectasis and low-grade activity along the pleural fluid-early malignant pleural effusions not excluded. 3. Aortic Atherosclerosis (ICD10-I70.0). 4. No definite involvement of the neck or skeleton. 5. Moderately enlarged prostate gland.  6. Omental biopsy showed: DIAGNOSIS:  A. SOFT TISSUE MASS, LEFT MESENTERY; CT-GUIDED CORE BIOPSY:  - LARGE B-CELL LYMPHOMA, CD10 POSITIVE  FISH testing for BCL 6 and cmycwas negative. Therefore he does not have a double hit lymphoma. Bone marrow biopsy was negative for lymphoma. Normal LDH. He did not meet criteria for cns prophylaxis. His only risk factors being age and stage IV  Interim PET/CT after 3 cycles should excellent response to treatment    Interval history- feels more fatigued. Mild tingling in his fingers which is intermittent. No worsenign symptoms as compared to last visit  ECOG PS- 0 Pain scale- 0  Review of systems-  Review of Systems  Constitutional: Negative for chills, fever, malaise/fatigue and weight loss.  HENT: Negative for congestion, ear discharge and  nosebleeds.   Eyes: Negative for blurred vision.  Respiratory: Negative for cough, hemoptysis, sputum production, shortness of breath and wheezing.   Cardiovascular: Negative for chest pain, palpitations, orthopnea and claudication.  Gastrointestinal: Negative for abdominal pain, blood in stool, constipation, diarrhea, heartburn, melena, nausea and vomiting.  Genitourinary: Negative for dysuria, flank pain, frequency, hematuria and urgency.  Musculoskeletal: Negative for back pain, joint pain and myalgias.  Skin: Negative for rash.  Neurological: Negative for dizziness, tingling, focal weakness, seizures, weakness and headaches.  Endo/Heme/Allergies: Does not bruise/bleed easily.  Psychiatric/Behavioral: Negative for depression and suicidal ideas. The patient does not have insomnia.       Allergies  Allergen Reactions  . Metformin Other (See Comments)    Stomach upset      Past Medical History:  Diagnosis Date  . Arthritis   . BPH (benign prostatic hyperplasia)   . Chronic prostatitis   . Cirrhosis of liver ([place])   . Dyslipidemia   . Elevated PSA   . Gastric reflux   . Gout   . HTN (hypertension)   . Over weight   . Psoriasis   . Testicular pain, right   . Urinary frequency      Past Surgical History:  Procedure Laterality Date  . hydrocelectomy    . IR FLUORO GUIDE PORT INSERTION RIGHT  04/11/2017  . PILONIDAL CYST EXCISION      Social History   Social History  . Marital status: Married    Spouse name: N/A  . Number of children: N/A  . Years of education: N/A   Occupational History  . Not on file.   Social History Main Topics  . Smoking status: Former Smoker    Packs/day: 1.50    Years: 8.00    Types: Cigarettes  . Smokeless tobacco: Never Used     Comment: quit 40 years ago  . Alcohol use No     Comment:  rarely  . Drug use: No  . Sexual activity: Not on file   Other Topics Concern  . Not on file   Social History Narrative  . No narrative on file    Family History  Problem Relation Age of Onset  . Uterine cancer Mother   . Diabetes Mother   . Cancer Mother   . Cancer Maternal Uncle   . Bladder Cancer Neg Hx   . Kidney disease Neg Hx   . Prostate cancer Neg Hx      Current Outpatient Prescriptions:  .  allopurinol (ZYLOPRIM) 300 MG tablet, Take 1 tablet (300 mg total) by mouth daily., Disp: 30 tablet, Rfl: 3 .  amLODipine (NORVASC) 10 MG tablet, TAKE ONE TABLET EVERY DAY, Disp: 30 tablet, Rfl: 6 .  aspirin EC 81 MG tablet, Take 81 mg by mouth daily. , Disp: , Rfl:  .  Cholecalciferol (VITAMIN D3) 1000 UNITS CAPS, Take 1,000 Units by mouth daily. , Disp: , Rfl:  .  diphenhydrAMINE (BENADRYL) 25 MG tablet, Take 25 mg by mouth at bedtime as needed., Disp: , Rfl:  .  finasteride (PROSCAR) 5 MG tablet, Take 1 tablet (5 mg total) by mouth daily., Disp: 90 tablet, Rfl: 4 .  lidocaine-prilocaine (EMLA) cream, Apply to affected area once, Disp: 30 g, Rfl: 3 .  loratadine (CLARITIN) 10 MG tablet, Take 10 mg by mouth daily as needed for allergies., Disp: , Rfl:  .  LORazepam (ATIVAN) 0.5 MG tablet, Take 1 tablet (0.5 mg total) by mouth every 6 (six) hours as needed (  Nausea or vomiting)., Disp: 30 tablet, Rfl: 0 .  losartan (COZAAR) 50 MG tablet, Take 1 tablet (50 mg total) by mouth daily., Disp: 90 tablet, Rfl: 3 .  ondansetron (ZOFRAN) 8 MG tablet, Take 1 tablet (8 mg total) by mouth 2 (two) times daily as needed for refractory nausea / vomiting. Start on day 3 after cyclophosphamide chemotherapy., Disp: 30 tablet, Rfl: 1 .  pantoprazole (PROTONIX) 20 MG tablet, Take 1 tablet (20 mg total) by mouth daily., Disp: 30 tablet, Rfl: 1 .  predniSONE (DELTASONE) 20 MG tablet, Take 5 tablets (100 mg total) by mouth daily. Take on days 1-5 of chemotherapy., Disp: 25 tablet, Rfl: 6 .  prochlorperazine  (COMPAZINE) 10 MG tablet, Take 1 tablet (10 mg total) by mouth every 6 (six) hours as needed (Nausea or vomiting)., Disp: 30 tablet, Rfl: 6  Physical exam: There were no vitals filed for this visit. Physical Exam  Constitutional: He is oriented to person, place, and time.  Obese. Appears in no acute distress  HENT:  Head: Normocephalic and atraumatic.  Eyes: Pupils are equal, round, and reactive to light. EOM are normal.  Neck: Normal range of motion.  Cardiovascular: Normal rate, regular rhythm and normal heart sounds.   Pulmonary/Chest: Effort normal and breath sounds normal.  Abdominal: Soft. Bowel sounds are normal.  Neurological: He is alert and oriented to person, place, and time.  Skin: Skin is warm and dry.     CMP Latest Ref Rng & Units 07/10/2017  Glucose 65 - 99 mg/dL 341(G)  BUN 6 - 20 mg/dL 14  Creatinine 4.36 - 0.16 mg/dL 5.80  Sodium 063 - 494 mmol/L 138  Potassium 3.5 - 5.1 mmol/L 3.9  Chloride 101 - 111 mmol/L 101  CO2 22 - 32 mmol/L 26  Calcium 8.9 - 10.3 mg/dL 9.1  Total Protein 6.5 - 8.1 g/dL 7.2  Total Bilirubin 0.3 - 1.2 mg/dL 0.5  Alkaline Phos 38 - 126 U/L 59  AST 15 - 41 U/L 18  ALT 17 - 63 U/L 16(L)   CBC Latest Ref Rng & Units 07/10/2017  WBC 3.8 - 10.6 K/uL 7.8  Hemoglobin 13.0 - 18.0 g/dL 12.7(L)  Hematocrit 40.0 - 52.0 % 36.8(L)  Platelets 150 - 440 K/uL 232    No images are attached to the encounter.  Nm Pet Image Restag (ps) Skull Base To Thigh  Result Date: 06/17/2017 CLINICAL DATA:  Subsequent treatment strategy for diffuse large B-cell lymphoma. EXAM: NUCLEAR MEDICINE PET SKULL BASE TO THIGH TECHNIQUE: 12.2 mCi F-18 FDG was injected intravenously. Full-ring PET imaging was performed from the skull base to thigh after the radiotracer. CT data was obtained and used for attenuation correction and anatomic localization. FASTING BLOOD GLUCOSE:  Value: 132 mg/dl COMPARISON:  [id number] FINDINGS: NECK: No hypermetabolic lymph nodes in the neck.  CHEST: The hypermetabolic 1.4 cm prevascular lymph node seen on the previous study has resolved in the interval measuring 5 mm short axis today with no evidence for FDG uptake above background mediastinal levels. The 6 mm short axis left internal mammary lymph node identified on the previous study has resolved, measuring 2 mm short axis today no discernible hypermetabolism on PET imaging. Left lower paraesophageal lymph node identified on the previous study and measured at 8 mm short axis has decreased to 3- 4 mm short axis today with no FDG uptake above background mediastinal levels evident. The index 2.1 cm short axis pericardial lymph node identified on the prior study measures 0.8 cm  today. FDG uptake in this region is below mediastinal levels. The moderate right pleural effusion is minimally smaller on today's exam. The small left pleural effusion seen previously has resolved. The right Port-A-Cath tip is positioned at the SVC/RA junction. Heart is enlarged. Insert calcium heart ABDOMEN/PELVIS: The large left mesenteric mass has decreased in the interval, but is difficult to measure as separate from small bowel given the lack of oral and intravenous contrast material. Measuring FDG uptake in this region is not possible and would not be accurate as small bowel accumulation would be included in the assessment. Prior study clearly showed FDG accumulation in the mesenteric mass well above background bowel levels in today there is no uptake above background bowel activity. Similarly, the diffuse omental and peritoneal hypermetabolism seen on the previous study has improved with these areas now approaching background soft tissue uptake. Right retrocrural lymph node measured previously at 13 mm short axis is 7 mm short axis today and demonstrates SUV max = 2.8 compared to 13.9 previously. Diverticular changes noted left colon. There is abdominal aortic atherosclerosis without aneurysm. SKELETON: No focal  hypermetabolic activity to suggest skeletal metastasis. IMPRESSION: 1. The large hypermetabolic left mesenteric mass has decreased in both size and hypermetabolism since the prior study. FDG accumulation is at or below small-bowel uptake on today's study. This lesion cannot be accurately measured on CT or PET imaging due to the immediately contiguous small bowel loops which blend into the lesion on this CT without oral or IV contrast. The diffuse omental/peritoneal disease seen on prior study show similar clear interval response to therapy. 2. The hypermetabolic mediastinal/pericardial, left internal mammary and retrocrural lymphadenopathy identified on the prior study has resolved with no enlarged lymph nodes at these locations today and no hypermetabolic FDG accumulation above background soft tissue/mediastinal levels. 3. Minimal decrease in moderate right pleural effusion with interval resolution of left pleural effusion. 4.  Aortic Atherosclerois (ICD10-170.0) Electronically Signed   By: Misty Stanley M.D.   On: 06/17/2017 11:41     Assessment and plan- Patient is a 66 y.o. male  h/o Stage IV DLBCL GCB, not double hit, low intermediaterisk IPI here for on treatment assessment prior to cycle 5of RCHOP chemotherapy   Counts ok to proceed with RCHOP with onpro neulasta tomorrow.  I will see him back in 3 weeks with cbc, cmp for cycle 6 of RCHOP with neulasta support. Repeat PET after 6 cycles  Mild vincristine induced peripheral neuropathy- continue to monitor. No dose reduction at this point   Visit Diagnosis 1. 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 ([place])   2. Encounter for antineoplastic chemotherapy   3. Encounter for monitoring rituximab therapy      Dr. Randa Evens, MD, MPH [facility] Pager- [phone] 07/10/2017 8:53 AM

## 2017-07-11 ENCOUNTER — Inpatient Hospital Stay: Payer: BLUE CROSS/BLUE SHIELD

## 2017-07-11 VITALS — BP 157/79 | HR 91 | Temp 97.6°F | Resp 17

## 2017-07-11 DIAGNOSIS — C8333 Diffuse large B-cell lymphoma, intra-abdominal lymph nodes: Secondary | ICD-10-CM | POA: Diagnosis not present

## 2017-07-11 MED ORDER — ACETAMINOPHEN 325 MG PO TABS
650.0000 mg | ORAL_TABLET | Freq: Once | ORAL | Status: AC
  Administered 2017-07-11: 650 mg via ORAL
  Filled 2017-07-11: qty 2

## 2017-07-11 MED ORDER — SODIUM CHLORIDE 0.9 % IV SOLN
Freq: Once | INTRAVENOUS | Status: AC
  Administered 2017-07-11: 09:00:00 via INTRAVENOUS
  Filled 2017-07-11: qty 1000

## 2017-07-11 MED ORDER — SODIUM CHLORIDE 0.9 % IV SOLN
2000.0000 mg | Freq: Once | INTRAVENOUS | Status: AC
  Administered 2017-07-11: 2000 mg via INTRAVENOUS
  Filled 2017-07-11: qty 100

## 2017-07-11 MED ORDER — SODIUM CHLORIDE 0.9% FLUSH
10.0000 mL | INTRAVENOUS | Status: DC | PRN
  Filled 2017-07-11: qty 10

## 2017-07-11 MED ORDER — PALONOSETRON HCL INJECTION 0.25 MG/5ML
0.2500 mg | Freq: Once | INTRAVENOUS | Status: AC
  Administered 2017-07-11: 0.25 mg via INTRAVENOUS
  Filled 2017-07-11: qty 5

## 2017-07-11 MED ORDER — PEGFILGRASTIM 6 MG/0.6ML ~~LOC~~ PSKT
6.0000 mg | PREFILLED_SYRINGE | Freq: Once | SUBCUTANEOUS | Status: AC
  Administered 2017-07-11: 6 mg via SUBCUTANEOUS
  Filled 2017-07-11: qty 0.6

## 2017-07-11 MED ORDER — SODIUM CHLORIDE 0.9 % IV SOLN: 375.0000 mg/m2 | Freq: Once | INTRAVENOUS | Status: DC

## 2017-07-11 MED ORDER — DOXORUBICIN HCL CHEMO IV INJECTION 2 MG/ML
50.0000 mg/m2 | Freq: Once | INTRAVENOUS | Status: AC
  Administered 2017-07-11: 128 mg via INTRAVENOUS
  Filled 2017-07-11: qty 64

## 2017-07-11 MED ORDER — VINCRISTINE SULFATE CHEMO INJECTION 1 MG/ML
2.0000 mg | Freq: Once | INTRAVENOUS | Status: AC
  Administered 2017-07-11: 2 mg via INTRAVENOUS
  Filled 2017-07-11: qty 2

## 2017-07-11 MED ORDER — DIPHENHYDRAMINE HCL 25 MG PO CAPS
50.0000 mg | ORAL_CAPSULE | Freq: Once | ORAL | Status: AC
  Administered 2017-07-11: 50 mg via ORAL
  Filled 2017-07-11: qty 2

## 2017-07-11 MED ORDER — HEPARIN SOD (PORK) LOCK FLUSH 100 UNIT/ML IV SOLN
500.0000 [IU] | Freq: Once | INTRAVENOUS | Status: AC | PRN
  Administered 2017-07-11: 500 [IU]
  Filled 2017-07-11: qty 5

## 2017-07-11 MED ORDER — SODIUM CHLORIDE 0.9 % IV SOLN
20.0000 mg | Freq: Once | INTRAVENOUS | Status: AC
  Administered 2017-07-11: 20 mg via INTRAVENOUS
  Filled 2017-07-11: qty 2

## 2017-07-11 MED ORDER — FAMOTIDINE IN NACL 20-0.9 MG/50ML-% IV SOLN
20.0000 mg | Freq: Two times a day (BID) | INTRAVENOUS | Status: DC
  Administered 2017-07-11: 20 mg via INTRAVENOUS
  Filled 2017-07-11: qty 50

## 2017-07-11 MED ORDER — SODIUM CHLORIDE 0.9 % IV SOLN
375.0000 mg/m2 | Freq: Once | INTRAVENOUS | Status: AC
  Administered 2017-07-11: 1000 mg via INTRAVENOUS
  Filled 2017-07-11: qty 100

## 2017-07-14 ENCOUNTER — Other Ambulatory Visit: Payer: Self-pay | Admitting: Urology

## 2017-07-14 ENCOUNTER — Other Ambulatory Visit: Payer: Self-pay | Admitting: *Deleted

## 2017-07-14 ENCOUNTER — Inpatient Hospital Stay: Payer: BLUE CROSS/BLUE SHIELD

## 2017-07-14 DIAGNOSIS — C8333 Diffuse large B-cell lymphoma, intra-abdominal lymph nodes: Secondary | ICD-10-CM | POA: Diagnosis not present

## 2017-07-14 MED ORDER — PEGFILGRASTIM INJECTION 6 MG/0.6ML ~~LOC~~
6.0000 mg | PREFILLED_SYRINGE | Freq: Once | SUBCUTANEOUS | Status: AC
  Administered 2017-07-14: 6 mg via SUBCUTANEOUS
  Filled 2017-07-14: qty 0.6

## 2017-07-14 MED ORDER — PEGFILGRASTIM 6 MG/0.6ML ~~LOC~~ PSKT: 6.0000 mg | PREFILLED_SYRINGE | Freq: Once | SUBCUTANEOUS | Status: DC

## 2017-07-23 ENCOUNTER — Other Ambulatory Visit: Payer: Self-pay | Admitting: Family Medicine

## 2017-07-23 DIAGNOSIS — I10 Essential (primary) hypertension: Secondary | ICD-10-CM

## 2017-07-31 ENCOUNTER — Inpatient Hospital Stay: Payer: BLUE CROSS/BLUE SHIELD

## 2017-07-31 ENCOUNTER — Inpatient Hospital Stay: Payer: BLUE CROSS/BLUE SHIELD | Attending: Oncology | Admitting: Oncology

## 2017-07-31 ENCOUNTER — Encounter: Payer: Self-pay | Admitting: Oncology

## 2017-07-31 VITALS — BP 135/87 | HR 91 | Temp 97.8°F | Resp 15 | Wt 289.0 lb

## 2017-07-31 DIAGNOSIS — I700 Atherosclerosis of aorta: Secondary | ICD-10-CM | POA: Insufficient documentation

## 2017-07-31 DIAGNOSIS — L409 Psoriasis, unspecified: Secondary | ICD-10-CM | POA: Diagnosis not present

## 2017-07-31 DIAGNOSIS — R188 Other ascites: Secondary | ICD-10-CM | POA: Diagnosis not present

## 2017-07-31 DIAGNOSIS — M109 Gout, unspecified: Secondary | ICD-10-CM | POA: Diagnosis not present

## 2017-07-31 DIAGNOSIS — K760 Fatty (change of) liver, not elsewhere classified: Secondary | ICD-10-CM | POA: Diagnosis not present

## 2017-07-31 DIAGNOSIS — C786 Secondary malignant neoplasm of retroperitoneum and peritoneum: Secondary | ICD-10-CM | POA: Diagnosis not present

## 2017-07-31 DIAGNOSIS — C8333 Diffuse large B-cell lymphoma, intra-abdominal lymph nodes: Secondary | ICD-10-CM | POA: Insufficient documentation

## 2017-07-31 DIAGNOSIS — Z5181 Encounter for therapeutic drug level monitoring: Secondary | ICD-10-CM | POA: Insufficient documentation

## 2017-07-31 DIAGNOSIS — Z7952 Long term (current) use of systemic steroids: Secondary | ICD-10-CM | POA: Insufficient documentation

## 2017-07-31 DIAGNOSIS — J90 Pleural effusion, not elsewhere classified: Secondary | ICD-10-CM | POA: Diagnosis not present

## 2017-07-31 DIAGNOSIS — R590 Localized enlarged lymph nodes: Secondary | ICD-10-CM | POA: Insufficient documentation

## 2017-07-31 DIAGNOSIS — Z79620 Long term (current) use of immunosuppressive biologic: Secondary | ICD-10-CM

## 2017-07-31 DIAGNOSIS — Z79899 Other long term (current) drug therapy: Secondary | ICD-10-CM | POA: Insufficient documentation

## 2017-07-31 DIAGNOSIS — I10 Essential (primary) hypertension: Secondary | ICD-10-CM | POA: Diagnosis not present

## 2017-07-31 DIAGNOSIS — Z5112 Encounter for antineoplastic immunotherapy: Secondary | ICD-10-CM | POA: Diagnosis not present

## 2017-07-31 DIAGNOSIS — R5383 Other fatigue: Secondary | ICD-10-CM | POA: Diagnosis not present

## 2017-07-31 DIAGNOSIS — N400 Enlarged prostate without lower urinary tract symptoms: Secondary | ICD-10-CM | POA: Insufficient documentation

## 2017-07-31 DIAGNOSIS — R5381 Other malaise: Secondary | ICD-10-CM | POA: Insufficient documentation

## 2017-07-31 DIAGNOSIS — Z5111 Encounter for antineoplastic chemotherapy: Secondary | ICD-10-CM | POA: Diagnosis not present

## 2017-07-31 DIAGNOSIS — Z7982 Long term (current) use of aspirin: Secondary | ICD-10-CM | POA: Insufficient documentation

## 2017-07-31 DIAGNOSIS — K219 Gastro-esophageal reflux disease without esophagitis: Secondary | ICD-10-CM | POA: Insufficient documentation

## 2017-07-31 DIAGNOSIS — E785 Hyperlipidemia, unspecified: Secondary | ICD-10-CM | POA: Diagnosis not present

## 2017-07-31 DIAGNOSIS — K7460 Unspecified cirrhosis of liver: Secondary | ICD-10-CM | POA: Insufficient documentation

## 2017-07-31 DIAGNOSIS — Z809 Family history of malignant neoplasm, unspecified: Secondary | ICD-10-CM

## 2017-07-31 DIAGNOSIS — Z87891 Personal history of nicotine dependence: Secondary | ICD-10-CM | POA: Insufficient documentation

## 2017-07-31 DIAGNOSIS — Z23 Encounter for immunization: Secondary | ICD-10-CM | POA: Insufficient documentation

## 2017-07-31 DIAGNOSIS — Z8049 Family history of malignant neoplasm of other genital organs: Secondary | ICD-10-CM

## 2017-07-31 DIAGNOSIS — M1990 Unspecified osteoarthritis, unspecified site: Secondary | ICD-10-CM | POA: Diagnosis not present

## 2017-07-31 LAB — COMPREHENSIVE METABOLIC PANEL
ALK PHOS: 61 U/L (ref 38–126)
ALT: 15 U/L — AB (ref 17–63)
AST: 17 U/L (ref 15–41)
Albumin: 4.1 g/dL (ref 3.5–5.0)
Anion gap: 10 (ref 5–15)
BUN: 17 mg/dL (ref 6–20)
CALCIUM: 9 mg/dL (ref 8.9–10.3)
CHLORIDE: 103 mmol/L (ref 101–111)
CO2: 25 mmol/L (ref 22–32)
CREATININE: 0.94 mg/dL (ref 0.61–1.24)
GFR calc Af Amer: >60 mL/min (ref >60)
GFR calc non Af Amer: >60 mL/min (ref >60)
Glucose, Bld: 163 mg/dL — ABNORMAL HIGH (ref 65–99)
Potassium: 3.8 mmol/L (ref 3.5–5.1)
SODIUM: 138 mmol/L (ref 135–145)
Total Bilirubin: 0.5 mg/dL (ref 0.3–1.2)
Total Protein: 7.3 g/dL (ref 6.5–8.1)

## 2017-07-31 LAB — CBC WITH DIFFERENTIAL/PLATELET
BASOS ABS: 0.1 10*3/uL (ref 0–0.1)
Basophils Relative: 1 %
EOS ABS: 0 10*3/uL (ref 0–0.7)
EOS PCT: 1 %
HCT: 36.5 % — ABNORMAL LOW (ref 40.0–52.0)
HEMOGLOBIN: 12.4 g/dL — AB (ref 13.0–18.0)
LYMPHS ABS: 0.5 10*3/uL — AB (ref 1.0–3.6)
LYMPHS PCT: 7 %
MCH: 30 pg (ref 26.0–34.0)
MCHC: 33.9 g/dL (ref 32.0–36.0)
MCV: 88.4 fL (ref 80.0–100.0)
Monocytes Absolute: 0.7 10*3/uL (ref 0.2–1.0)
Monocytes Relative: 9 %
NEUTROS PCT: 82 %
Neutro Abs: 5.9 10*3/uL (ref 1.4–6.5)
PLATELETS: 281 10*3/uL (ref 150–440)
RBC: 4.13 MIL/uL — AB (ref 4.40–5.90)
RDW: 15 % — ABNORMAL HIGH (ref 11.5–14.5)
WBC: 7.2 10*3/uL (ref 3.8–10.6)
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
## 2017-07-31 NOTE — Progress Notes (Signed)
Hematology/Oncology Consult note [facility]  Telephone:([phone] Fax:([phone]  Patient Care Team: [name]., MD as PCP - General (Family Medicine) [name], MD as Consulting Physician (Pulmonary Disease)   Name of the patient: Peter Ryan  [id number]  [birth year]   Date of visit: 07/31/17  Diagnosis- Stage IV DLBCL GCB IPI score- low intermediate risk  Chief complaint/ Reason for visit- 1. on treatment assessment prior to cycle 6of RCHOP   Heme/Onc history:1. Patient is a 66 year old male who initially presented with symptoms of worsening shortness of breathand was found to have right pleural effusion on chest x-ray. CT chest with contrast on 11/12/2016 revealed multiple lymph nodes throughout the mediastinum measuring approximately 1 cm in short axis. Upper abdomen showed evidence of ascites and mild cirrhotic changes in the liver. Patient underwent thoracocentesis for recurrent right pleural effusion. Fluid was exudative in nature and LDH in the pleural fluid was elevated. Cytology was negative for malignancy. Patient also had peripheral flow cytometry done which was negative for malignancy. Pleural and peritoneal fluid cytology has been negative for malignancy/ lymphoma  2. Ultrasound of the abdomen on 01/24/2017 showed sludge in the gallbladder and parenchymal of the liver suggesting cirrhosis as well as moderate ascites. Initially his ascites with attribute it to his cirrhosis and nonalcoholic fatty liver disease. He was also treated for culture negative neutrophilic SBP based on asciticfluid analysis. Given that the ascites fluid was exudative in nature which is not consistent with cirrhosis from nonalcoholic fatty liver disease as well as no evidence of splenomegaly or abnormal LFTs, CT abdomen was obtained to rule out any other etiology for ascites  3. CT abdomen on 03/21/2017 showed: IMPRESSION: 1. Bulky central  mesenteric mass lesion encasing major mesenteric vascular anatomy without substantial mass-effect. Diffuse omental caking is evident and peritoneal enhancement is identified. There is associated mesenteric, juxta diaphragmatic, retroperitoneal, and retrocrural lymphadenopathy. Lymphoma would be a consideration. Metastatic disease could also have this appearance. 2. Small a moderate volume ascites  4. Patient denies any unintentional weight loss. His appetite is fair denies any drenching night sweats or recurrent fevers. Does report fatigue  5. PET CT showed: IMPRESSION: 1. Large hypermetabolic left eccentric mesenteric mass with extensive omental caking and peritoneal spread of tumor. This mechanism of spread would seem to favor a gastrointestinal primary, with entities such as carcinoid tumor or lymphoma as differential diagnostic considerations. Tissue diagnosis recommended. 2. Hypermetabolic mediastinal adenopathy compatible with malignancy. Moderate to large bilateral pleural effusions with passive atelectasis and low-grade activity along the pleural fluid-early malignant pleural effusions not excluded. 3. Aortic Atherosclerosis (ICD10-I70.0). 4. No definite involvement of the neck or skeleton. 5. Moderately enlarged prostate gland.  6. Omental biopsy showed: DIAGNOSIS:  A. SOFT TISSUE MASS, LEFT MESENTERY; CT-GUIDED CORE BIOPSY:  - LARGE B-CELL LYMPHOMA, CD10 POSITIVE  FISH testing for BCL 6 and cmycwas negative. Therefore he does not have a double hit lymphoma. Bone marrow biopsy was negative for lymphoma. Normal LDH. He did not meet criteria for cns prophylaxis. His only risk factors being age and stage IV  Interim PET/CT after 3 cycles should excellent response to treatment   Interval history- he is doing well except for some mild fatigue  ECOG PS- 0 Pain scale- 0   Review of systems- Review of Systems  Constitutional: Positive for malaise/fatigue. Negative for  chills, fever and weight loss.  HENT: Negative for congestion, ear discharge and nosebleeds.   Eyes: Negative for blurred vision.  Respiratory: Negative for cough, hemoptysis, sputum production, shortness of breath and wheezing.   Cardiovascular: Negative for chest pain, palpitations, orthopnea and claudication.  Gastrointestinal: Negative for abdominal pain, blood in stool, constipation, diarrhea, heartburn, melena, nausea and vomiting.  Genitourinary: Negative for dysuria, flank pain, frequency, hematuria and urgency.  Musculoskeletal: Negative for back pain, joint pain and myalgias.  Skin: Negative for rash.  Neurological: Negative for dizziness, tingling, focal weakness, seizures, weakness and headaches.  Endo/Heme/Allergies: Does not bruise/bleed easily.  Psychiatric/Behavioral: Negative for depression and suicidal ideas. The patient does not have insomnia.       Allergies  Allergen Reactions  . Metformin Other (See Comments)    Stomach upset      Past Medical History:  Diagnosis Date  . Arthritis   . BPH (benign prostatic hyperplasia)   . Cancer ([place])   . Chronic prostatitis   . Cirrhosis of liver ([place])   . Dyslipidemia   . Elevated PSA   . Gastric reflux   . Gout   . HTN (hypertension)   . Over weight   . Psoriasis   . Testicular pain, right   . Urinary frequency      Past Surgical History:  Procedure Laterality Date  . hydrocelectomy    . IR FLUORO GUIDE PORT INSERTION RIGHT  04/11/2017  . PILONIDAL CYST EXCISION      Social History   Social History  . Marital status: Married    Spouse name: N/A  . Number of children: N/A  . Years of education: N/A   Occupational History  . Not on file.   Social History Main Topics  . Smoking status: Former Smoker    Packs/day: 1.50    Years: 8.00    Types: Cigarettes  . Smokeless tobacco: Never Used     Comment: quit 40 years ago  . Alcohol use No     Comment: rarely  . Drug use: No  . Sexual activity: Not  on file   Other Topics Concern  . Not on file   Social History Narrative  . No narrative on file    Family History  Problem Relation Age of Onset  . Uterine cancer Mother   . Diabetes Mother   . Cancer Mother   . Cancer Maternal Uncle   . Bladder Cancer Neg Hx   . Kidney disease Neg Hx   . Prostate cancer Neg Hx      Current Outpatient Prescriptions:  .  allopurinol (ZYLOPRIM) 300 MG tablet, Take 1 tablet (300 mg total) by mouth daily., Disp: 30 tablet, Rfl: 3 .  amLODipine (NORVASC) 10 MG tablet, TAKE ONE TABLET BY MOUTH EVERY DAY, Disp: 30 tablet, Rfl: 3 .  aspirin EC 81 MG tablet, Take 81 mg by mouth daily. , Disp: , Rfl:  .  Cholecalciferol (VITAMIN D3) 1000 UNITS CAPS, Take 1,000 Units by mouth daily. , Disp: , Rfl:  .  finasteride (PROSCAR) 5 MG tablet, Take 1 tablet (5 mg total) by mouth daily., Disp: 90 tablet, Rfl: 4 .  lidocaine-prilocaine (EMLA) cream, Apply to affected area once, Disp: 30 g, Rfl: 3 .  loratadine (CLARITIN) 10 MG tablet, Take 10 mg by mouth daily as needed for allergies., Disp: , Rfl:  .  losartan (COZAAR) 50 MG tablet, Take 1 tablet (50 mg total) by mouth daily., Disp: 90 tablet, Rfl: 3 .  ondansetron (ZOFRAN) 8 MG tablet, Take 1 tablet (8 mg total) by mouth 2 (two) times daily as needed for  refractory nausea / vomiting. Start on day 3 after cyclophosphamide chemotherapy., Disp: 30 tablet, Rfl: 1 .  pantoprazole (PROTONIX) 20 MG tablet, Take 1 tablet (20 mg total) by mouth daily., Disp: 30 tablet, Rfl: 1 .  predniSONE (DELTASONE) 20 MG tablet, Take 5 tablets (100 mg total) by mouth daily. Take on days 1-5 of chemotherapy., Disp: 25 tablet, Rfl: 6 .  prochlorperazine (COMPAZINE) 10 MG tablet, Take 1 tablet (10 mg total) by mouth every 6 (six) hours as needed (Nausea or vomiting)., Disp: 30 tablet, Rfl: 6 .  LORazepam (ATIVAN) 0.5 MG tablet, Take 1 tablet (0.5 mg total) by mouth every 6 (six) hours as needed (Nausea or vomiting). (Patient not taking:  Reported on 07/31/2017), Disp: 30 tablet, Rfl: 0 No current facility-administered medications for this visit.   Facility-Administered Medications Ordered in Other Visits:  .  pegfilgrastim (NEULASTA ONPRO KIT) injection 6 mg, 6 mg, Subcutaneous, Once, Earlie Server, MD  Physical exam:  Vitals:   07/31/17 1029  BP: 135/87  Pulse: 91  Resp: 15  Temp: 97.8 F (36.6 C)  TempSrc: Tympanic  Weight: 289 lb (131.1 kg)   Physical Exam  Constitutional: He is oriented to person, place, and time.  Obese. Appears in no acute distress  HENT:  Head: Normocephalic and atraumatic.  Eyes: EOM are normal. Pupils are equal, round, and reactive to light.  Neck: Normal range of motion.  Cardiovascular: Normal rate, regular rhythm and normal heart sounds.  Pulmonary/Chest: Effort normal and breath sounds normal.  Abdominal: Soft. Bowel sounds are normal.  Neurological: He is alert and oriented to person, place, and time.  Skin: Skin is warm and dry.     CMP Latest Ref Rng & Units 07/31/2017  Glucose 65 - 99 mg/dL 163(H)  BUN 6 - 20 mg/dL 17  Creatinine 0.61 - 1.24 mg/dL 0.94  Sodium 135 - 145 mmol/L 138  Potassium 3.5 - 5.1 mmol/L 3.8  Chloride 101 - 111 mmol/L 103  CO2 22 - 32 mmol/L 25  Calcium 8.9 - 10.3 mg/dL 9.0  Total Protein 6.5 - 8.1 g/dL 7.3  Total Bilirubin 0.3 - 1.2 mg/dL 0.5  Alkaline Phos 38 - 126 U/L 61  AST 15 - 41 U/L 17  ALT 17 - 63 U/L 15(L)   CBC Latest Ref Rng & Units 07/31/2017  WBC 3.8 - 10.6 K/uL 7.2  Hemoglobin 13.0 - 18.0 g/dL 12.4(L)  Hematocrit 40.0 - 52.0 % 36.5(L)  Platelets 150 - 440 K/uL 281      Assessment and plan- Patient is a 66 y.o. male h/o Stage IV DLBCL GCB, not double hit, low intermediaterisk IPI here for on treatment assessment prior to cycle 6of RCHOP chemotherapy   Counts ok to proceed with cycle 6 of RCHOP tomorrow with onpro neulasta support. This will be his last RCHOP. He will get PET/CT scan 2 weeks from now and I will see him thereafter  with cbc/cmp     Visit Diagnosis 1. 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 ([place])   2. Encounter for antineoplastic chemotherapy   3. Visit for monitoring Rituxan therapy      Dr. Randa Evens, MD, MPH [facility] Pager- [phone] 07/31/2017 4:56 PM

## 2017-08-01 ENCOUNTER — Inpatient Hospital Stay: Payer: BLUE CROSS/BLUE SHIELD

## 2017-08-01 ENCOUNTER — Other Ambulatory Visit: Payer: Self-pay | Admitting: *Deleted

## 2017-08-01 VITALS — BP 143/83 | HR 93 | Temp 98.3°F | Resp 18 | Wt 289.0 lb

## 2017-08-01 DIAGNOSIS — C8333 Diffuse large B-cell lymphoma, intra-abdominal lymph nodes: Secondary | ICD-10-CM | POA: Diagnosis not present

## 2017-08-01 MED ORDER — PEGFILGRASTIM 6 MG/0.6ML ~~LOC~~ PSKT
6.0000 mg | PREFILLED_SYRINGE | Freq: Once | SUBCUTANEOUS | Status: AC
  Administered 2017-08-01: 6 mg via SUBCUTANEOUS
  Filled 2017-08-01: qty 0.6

## 2017-08-01 MED ORDER — FAMOTIDINE IN NACL 20-0.9 MG/50ML-% IV SOLN
20.0000 mg | Freq: Two times a day (BID) | INTRAVENOUS | Status: DC
Start: 2017-08-01 — End: 2017-08-01
  Administered 2017-08-01: 20 mg via INTRAVENOUS
  Filled 2017-08-01: qty 50

## 2017-08-01 MED ORDER — SODIUM CHLORIDE 0.9 % IV SOLN
375.0000 mg/m2 | Freq: Once | INTRAVENOUS | Status: AC
  Administered 2017-08-01: 1000 mg via INTRAVENOUS
  Filled 2017-08-01: qty 100

## 2017-08-01 MED ORDER — SODIUM CHLORIDE 0.9 % IV SOLN
Freq: Once | INTRAVENOUS | Status: AC
  Administered 2017-08-01: 09:00:00 via INTRAVENOUS
  Filled 2017-08-01: qty 1000

## 2017-08-01 MED ORDER — CYCLOPHOSPHAMIDE CHEMO INJECTION 2 GM
2000.0000 mg | Freq: Once | INTRAMUSCULAR | Status: AC
  Administered 2017-08-01: 2000 mg via INTRAVENOUS
  Filled 2017-08-01: qty 100

## 2017-08-01 MED ORDER — SODIUM CHLORIDE 0.9% FLUSH
10.0000 mL | INTRAVENOUS | Status: DC | PRN
  Filled 2017-08-01: qty 10

## 2017-08-01 MED ORDER — DOXORUBICIN HCL CHEMO IV INJECTION 2 MG/ML
50.0000 mg/m2 | Freq: Once | INTRAVENOUS | Status: AC
  Administered 2017-08-01: 128 mg via INTRAVENOUS
  Filled 2017-08-01: qty 64

## 2017-08-01 MED ORDER — SODIUM CHLORIDE 0.9 % IV SOLN: 375.0000 mg/m2 | Freq: Once | INTRAVENOUS | Status: DC

## 2017-08-01 MED ORDER — VINCRISTINE SULFATE CHEMO INJECTION 1 MG/ML
1.0000 mg | Freq: Once | INTRAVENOUS | Status: AC
  Administered 2017-08-01: 1 mg via INTRAVENOUS
  Filled 2017-08-01: qty 1

## 2017-08-01 MED ORDER — MAGIC MOUTHWASH: 5.0000 mL | Freq: Four times a day (QID) | ORAL | 0 refills | Status: DC | PRN

## 2017-08-01 MED ORDER — ACETAMINOPHEN 325 MG PO TABS
650.0000 mg | ORAL_TABLET | Freq: Once | ORAL | Status: AC
  Administered 2017-08-01: 650 mg via ORAL
  Filled 2017-08-01: qty 2

## 2017-08-01 MED ORDER — SODIUM CHLORIDE 0.9 % IV SOLN
20.0000 mg | Freq: Once | INTRAVENOUS | Status: AC
  Administered 2017-08-01: 20 mg via INTRAVENOUS
  Filled 2017-08-01: qty 2

## 2017-08-01 MED ORDER — DIPHENHYDRAMINE HCL 25 MG PO CAPS
50.0000 mg | ORAL_CAPSULE | Freq: Once | ORAL | Status: AC
  Administered 2017-08-01: 50 mg via ORAL
  Filled 2017-08-01: qty 2

## 2017-08-01 MED ORDER — PALONOSETRON HCL INJECTION 0.25 MG/5ML
0.2500 mg | Freq: Once | INTRAVENOUS | Status: AC
  Administered 2017-08-01: 0.25 mg via INTRAVENOUS
  Filled 2017-08-01: qty 5

## 2017-08-01 MED ORDER — HEPARIN SOD (PORK) LOCK FLUSH 100 UNIT/ML IV SOLN
500.0000 [IU] | Freq: Once | INTRAVENOUS | Status: AC | PRN
  Administered 2017-08-01: 500 [IU]
  Filled 2017-08-01: qty 5

## 2017-08-01 MED ORDER — SODIUM CHLORIDE 0.9 % IV SOLN
20.0000 mg | Freq: Once | INTRAVENOUS | Status: DC
  Filled 2017-08-01: qty 2

## 2017-08-04 ENCOUNTER — Other Ambulatory Visit: Payer: Self-pay | Admitting: *Deleted

## 2017-08-04 ENCOUNTER — Telehealth: Payer: Self-pay | Admitting: Oncology

## 2017-08-04 ENCOUNTER — Inpatient Hospital Stay: Payer: BLUE CROSS/BLUE SHIELD

## 2017-08-04 DIAGNOSIS — C8330 Diffuse large B-cell lymphoma, unspecified site: Secondary | ICD-10-CM

## 2017-08-04 DIAGNOSIS — C8333 Diffuse large B-cell lymphoma, intra-abdominal lymph nodes: Secondary | ICD-10-CM | POA: Diagnosis not present

## 2017-08-04 MED ORDER — PEGFILGRASTIM 6 MG/0.6ML ~~LOC~~ PSKT: 6.0000 mg | PREFILLED_SYRINGE | Freq: Once | SUBCUTANEOUS | Status: DC

## 2017-08-04 MED ORDER — PEGFILGRASTIM INJECTION 6 MG/0.6ML ~~LOC~~
6.0000 mg | PREFILLED_SYRINGE | Freq: Once | SUBCUTANEOUS | Status: AC
  Administered 2017-08-04: 6 mg via SUBCUTANEOUS
  Filled 2017-08-04: qty 0.6

## 2017-08-04 NOTE — Telephone Encounter (Signed)
Patient came as walk in to say that his [place] ahd stopped working over the weekend and that he needed a [place] notified. Neulasta Inj added for today, per Sherry/Verbal.  Patient sent to Registration to register.

## 2017-08-04 NOTE — Progress Notes (Signed)
onpro placed on Friday did not infuse the entire dose over the weekend, pt here today for neulasta injection, MD notified, see new order

## 2017-08-13 ENCOUNTER — Encounter
Admission: RE | Admit: 2017-08-13 | Discharge: 2017-08-13 | Disposition: A | Discharge status: Home / Self Care | Payer: BLUE CROSS/BLUE SHIELD | Source: Ambulatory Visit | Attending: Oncology | Admitting: Oncology

## 2017-08-13 DIAGNOSIS — K5730 Diverticulosis of large intestine without perforation or abscess without bleeding: Secondary | ICD-10-CM | POA: Insufficient documentation

## 2017-08-13 DIAGNOSIS — I517 Cardiomegaly: Secondary | ICD-10-CM | POA: Insufficient documentation

## 2017-08-13 DIAGNOSIS — J90 Pleural effusion, not elsewhere classified: Secondary | ICD-10-CM | POA: Insufficient documentation

## 2017-08-13 DIAGNOSIS — I700 Atherosclerosis of aorta: Secondary | ICD-10-CM | POA: Insufficient documentation

## 2017-08-13 DIAGNOSIS — C8333 Diffuse large B-cell lymphoma, intra-abdominal lymph nodes: Secondary | ICD-10-CM

## 2017-08-13 LAB — GLUCOSE, CAPILLARY: GLUCOSE-CAPILLARY: 134 mg/dL — AB (ref 65–99)

## 2017-08-13 MED ORDER — FLUDEOXYGLUCOSE F - 18 (FDG) INJECTION
12.0000 | Freq: Once | INTRAVENOUS | Status: AC | PRN
  Administered 2017-08-13: 12.75 via INTRAVENOUS

## 2017-08-19 NOTE — Progress Notes (Signed)
Hematology/Oncology Consult note [facility]  [phone] [phone]  Patient Care Team: [name]., MD as PCP - General (Family Medicine) [name], MD as Consulting Physician (Pulmonary Disease)   Name of the patient: Peter Ryan  [id number]  03/01/1951   Date of visit: 08/19/17  Diagnosis- Stage IV DLBCL GCB IPI score- low intermediate risk   Chief complaint/ Reason for visit- discuss pet ct results  Heme/Onc history: 1. Patient is a 66 year old male who initially presented with symptoms of worsening shortness of breathand was found to have right pleural effusion on chest x-ray. CT chest with contrast on 11/12/2016 revealed multiple lymph nodes throughout the mediastinum measuring approximately 1 cm in short axis. Upper abdomen showed evidence of ascites and mild cirrhotic changes in the liver. Patient underwent thoracocentesis for recurrent right pleural effusion. Fluid was exudative in nature and LDH in the pleural fluid was elevated. Cytology was negative for malignancy. Patient also had peripheral flow cytometry done which was negative for malignancy. Pleural and peritoneal fluid cytology has been negative for malignancy/ lymphoma  2. Ultrasound of the abdomen on 01/24/2017 showed sludge in the gallbladder and parenchymal of the liver suggesting cirrhosis as well as moderate ascites. Initially his ascites with attribute it to his cirrhosis and nonalcoholic fatty liver disease. He was also treated for culture negative neutrophilic SBP based on asciticfluid analysis. Given that the ascites fluid was exudative in nature which is not consistent with cirrhosis from nonalcoholic fatty liver disease as well as no evidence of splenomegaly or abnormal LFTs, CT abdomen was obtained to rule out any other etiology for ascites  3. CT abdomen on 03/21/2017 showed: IMPRESSION: 1. Bulky central mesenteric mass lesion encasing major  mesenteric vascular anatomy without substantial mass-effect. Diffuse omental caking is evident and peritoneal enhancement is identified. There is associated mesenteric, juxta diaphragmatic, retroperitoneal, and retrocrural lymphadenopathy. Lymphoma would be a consideration. Metastatic disease could also have this appearance. 2. Small a moderate volume ascites  4. Patient denies any unintentional weight loss. His appetite is fair denies any drenching night sweats or recurrent fevers. Does report fatigue  5. PET CT showed: IMPRESSION: 1. Large hypermetabolic left eccentric mesenteric mass with extensive omental caking and peritoneal spread of tumor. This mechanism of spread would seem to favor a gastrointestinal primary, with entities such as carcinoid tumor or lymphoma as differential diagnostic considerations. Tissue diagnosis recommended. 2. Hypermetabolic mediastinal adenopathy compatible with malignancy. Moderate to large bilateral pleural effusions with passive atelectasis and low-grade activity along the pleural fluid-early malignant pleural effusions not excluded. 3. Aortic Atherosclerosis (ICD10-I70.0). 4. No definite involvement of the neck or skeleton. 5. Moderately enlarged prostate gland.  6. Omental biopsy showed: DIAGNOSIS:  A. SOFT TISSUE MASS, LEFT MESENTERY; CT-GUIDED CORE BIOPSY:  - LARGE B-CELL LYMPHOMA, CD10 POSITIVE  FISH testing for BCL 6 and cmycwas negative. Therefore he does not have a double hit lymphoma. Bone marrow biopsy was negative for lymphoma. Normal LDH. He did not meet criteria for cns prophylaxis. His only risk factors being age and stage IV  Interim PET/CT after 3 cycles should excellent response to treatment  Repeat PET/CT scan after 6 cycles of R CHOP showed:Grossly similar size of small bowel mesenteric mass, somewhat difficult to measure secondary to its morphology. Slight increase in mild to moderate hypermetabolism. 2. No evidence  of extr abdominal hypermetabolic lymphoma. 3. Decrease in small right pleural effusion.   Interval history- he is doing well. deneis any complaints  ECOG PS- 0 Pain scale- 0   Review of systems- Review of Systems  Constitutional: Negative for chills, fever, malaise/fatigue and weight loss.  HENT: Negative for congestion, ear discharge and nosebleeds.   Eyes: Negative for blurred vision.  Respiratory: Negative for cough, hemoptysis, sputum production, shortness of breath and wheezing.   Cardiovascular: Negative for chest pain, palpitations, orthopnea and claudication.  Gastrointestinal: Negative for abdominal pain, blood in stool, constipation, diarrhea, heartburn, melena, nausea and vomiting.  Genitourinary: Negative for dysuria, flank pain, frequency, hematuria and urgency.  Musculoskeletal: Negative for back pain, joint pain and myalgias.  Skin: Negative for rash.  Neurological: Negative for dizziness, tingling, focal weakness, seizures, weakness and headaches.  Endo/Heme/Allergies: Does not bruise/bleed easily.  Psychiatric/Behavioral: Negative for depression and suicidal ideas. The patient does not have insomnia.       Allergies  Allergen Reactions  . Metformin Other (See Comments)    Stomach upset      Past Medical History:  Diagnosis Date  . Arthritis   . BPH (benign prostatic hyperplasia)   . Cancer ([place])   . Chronic prostatitis   . Cirrhosis of liver ([place])   . Dyslipidemia   . Elevated PSA   . Gastric reflux   . Gout   . HTN (hypertension)   . Over weight   . Psoriasis   . Testicular pain, right   . Urinary frequency      Past Surgical History:  Procedure Laterality Date  . hydrocelectomy    . IR FLUORO GUIDE PORT INSERTION RIGHT  04/11/2017  . PILONIDAL CYST EXCISION      Social History   Socioeconomic History  . Marital status: Married    Spouse name: Not on file  . Number of children: Not on file  . Years of education: Not on file  .  Highest education level: Not on file  Social Needs  . Financial resource strain: Not on file  . Food insecurity - worry: Not on file  . Food insecurity - inability: Not on file  . Transportation needs - medical: Not on file  . Transportation needs - non-medical: Not on file  Occupational History  . Not on file  Tobacco Use  . Smoking status: Former Smoker    Packs/day: 1.50    Years: 8.00    Pack years: 12.00    Types: Cigarettes  . Smokeless tobacco: Never Used  . Tobacco comment: quit 40 years ago  Substance and Sexual Activity  . Alcohol use: No    Alcohol/week: 0.0 oz    Comment: rarely  . Drug use: No  . Sexual activity: Not on file  Other Topics Concern  . Not on file  Social History Narrative  . Not on file    Family History  Problem Relation Age of Onset  . Uterine cancer Mother   . Diabetes Mother   . Cancer Mother   . Cancer Maternal Uncle   . Bladder Cancer Neg Hx   . Kidney disease Neg Hx   . Prostate cancer Neg Hx      Current Outpatient Medications:  .  allopurinol (ZYLOPRIM) 300 MG tablet, Take 1 tablet (300 mg total) by mouth daily., Disp: 30 tablet, Rfl: 3 .  amLODipine (NORVASC) 10 MG tablet, TAKE ONE TABLET BY MOUTH EVERY DAY, Disp: 30 tablet, Rfl: 3 .  aspirin EC 81 MG tablet, Take 81 mg by mouth daily. , Disp: , Rfl:  .  Cholecalciferol (VITAMIN D3) 1000 UNITS CAPS, Take  1,000 Units by mouth daily. , Disp: , Rfl:  .  finasteride (PROSCAR) 5 MG tablet, Take 1 tablet (5 mg total) by mouth daily., Disp: 90 tablet, Rfl: 4 .  lidocaine-prilocaine (EMLA) cream, Apply to affected area once, Disp: 30 g, Rfl: 3 .  loratadine (CLARITIN) 10 MG tablet, Take 10 mg by mouth daily as needed for allergies., Disp: , Rfl:  .  LORazepam (ATIVAN) 0.5 MG tablet, Take 1 tablet (0.5 mg total) by mouth every 6 (six) hours as needed (Nausea or vomiting). (Patient not taking: Reported on 07/31/2017), Disp: 30 tablet, Rfl: 0 .  losartan (COZAAR) 50 MG tablet, Take 1 tablet  (50 mg total) by mouth daily., Disp: 90 tablet, Rfl: 3 .  magic mouthwash SOLN, Take 5 mLs by mouth 4 (four) times daily as needed for mouth pain., Disp: 480 mL, Rfl: 0 .  ondansetron (ZOFRAN) 8 MG tablet, Take 1 tablet (8 mg total) by mouth 2 (two) times daily as needed for refractory nausea / vomiting. Start on day 3 after cyclophosphamide chemotherapy., Disp: 30 tablet, Rfl: 1 .  pantoprazole (PROTONIX) 20 MG tablet, Take 1 tablet (20 mg total) by mouth daily., Disp: 30 tablet, Rfl: 1 .  predniSONE (DELTASONE) 20 MG tablet, Take 5 tablets (100 mg total) by mouth daily. Take on days 1-5 of chemotherapy., Disp: 25 tablet, Rfl: 6 .  prochlorperazine (COMPAZINE) 10 MG tablet, Take 1 tablet (10 mg total) by mouth every 6 (six) hours as needed (Nausea or vomiting)., Disp: 30 tablet, Rfl: 6  Physical exam:  Vitals:   08/20/17 0936  BP: (!) 164/72  Pulse: (!) 101  Resp: 18  Temp: 98.2 F (36.8 C)  TempSrc: Tympanic  Weight: 287 lb 12.8 oz (130.5 kg)   Physical Exam  Constitutional: He is oriented to person, place, and time and well-developed, well-nourished, and in no distress.  HENT:  Head: Normocephalic and atraumatic.  Eyes: EOM are normal. Pupils are equal, round, and reactive to light.  Neck: Normal range of motion.  Cardiovascular: Normal rate, regular rhythm and normal heart sounds.  Pulmonary/Chest: Effort normal and breath sounds normal.  Abdominal: Soft. Bowel sounds are normal.  Neurological: He is alert and oriented to person, place, and time.  Skin: Skin is warm and dry.     CMP Latest Ref Rng & Units 07/31/2017  Glucose 65 - 99 mg/dL 163(H)  BUN 6 - 20 mg/dL 17  Creatinine 0.61 - 1.24 mg/dL 0.94  Sodium 135 - 145 mmol/L 138  Potassium 3.5 - 5.1 mmol/L 3.8  Chloride 101 - 111 mmol/L 103  CO2 22 - 32 mmol/L 25  Calcium 8.9 - 10.3 mg/dL 9.0  Total Protein 6.5 - 8.1 g/dL 7.3  Total Bilirubin 0.3 - 1.2 mg/dL 0.5  Alkaline Phos 38 - 126 U/L 61  AST 15 - 41 U/L 17  ALT 17  - 63 U/L 15(L)   CBC Latest Ref Rng & Units 07/31/2017  WBC 3.8 - 10.6 K/uL 7.2  Hemoglobin 13.0 - 18.0 g/dL 12.4(L)  Hematocrit 40.0 - 52.0 % 36.5(L)  Platelets 150 - 440 K/uL 281    No images are attached to the encounter.  Nm Pet Image Restag (ps) Skull Base To Thigh  Result Date: 08/13/2017 CLINICAL DATA:  Subsequent treatment strategy for diffuse large B-cell lymphoma. Last chemotherapy November 2nd. EXAM: NUCLEAR MEDICINE PET SKULL BASE TO THIGH TECHNIQUE: 12.8 mCi F-18 FDG was injected intravenously. Full-ring PET imaging was performed from the skull base to thigh  after the radiotracer. CT data was obtained and used for attenuation correction and anatomic localization. FASTING BLOOD GLUCOSE:  Value: 134 mg/dl COMPARISON:  06/17/2017 FINDINGS: NECK: No areas of abnormal hypermetabolism. Cerebral atrophy with periventricular white matter low attenuation likely related to small vessel ischemic change. No cervical adenopathy. CHEST: No pulmonary parenchymal or thoracic nodal hypermetabolism. A right Port-A-Cath terminates at the high right atrium. Mild cardiomegaly. Multivessel coronary artery atherosclerosis. Multiple small prevascular nodes are not significantly changed. A small right pleural effusion is slightly decreased. Aortic atherosclerosis. ABDOMEN/PELVIS: Amorphous soft tissue density in the jejunal mesentery with low-level hypermetabolism. This is again difficult to differentiate from surrounding non-opacified bowel loops. Measures maximally 10.2 x 6.7 cm and a S.U.V. max of 5.1 today versus grossly similar in size (when remeasured) and a S.U.V. max of 3.6 on the prior exam. No other abdominopelvic parenchymal or nodal hypermetabolism. Mild heterogeneous hepatic steatosis with probable hepatomegaly. Normal adrenal glands. Left renal cyst. Abdominal aortic atherosclerosis. SKELETON: No abnormal marrow activity. Degenerative partial fusion of the right sacroiliac joint. IMPRESSION: 1.  Grossly similar size of small bowel mesenteric mass, somewhat difficult to measure secondary to its morphology. Slight increase in mild to moderate hypermetabolism. 2. No evidence of extr abdominal hypermetabolic lymphoma. 3. Decrease in small right pleural effusion. 4. Coronary artery atherosclerosis. Aortic Atherosclerosis (ICD10-I70.0). 5. Hepatic steatosis. Electronically Signed   By: Abigail Miyamoto M.D.   On: 08/13/2017 14:21     Assessment and plan- Patient is a 66 y.o. male  h/o Stage IV DLBCL GCB, not double hit, low intermediaterisk IPI status post 6 cycles of R CHOP chemotherapy  I personally reviewed patient's PET/CT images and I have discussed his case at the tumor board.  Current CT scan measured soft tissue density in the jejunal mesentery to be about 10.2 x 6.7 cm and with an SUV of 5.9 is compared to 3.6 on prior exam.  However at my discussion at tumor board radiology commented that it is hard to differentiate hypermetabolic them in the mass given the surrounding nonopacified bowel loops and SUV measurements vary be based on what level they are measured.    Prior to starting R CHOP chemotherapy patient had hypermetabolic nodes both about and below the diaphragm.  He also had pleural effusion and ascites which was not positive for lymphoma on cytology.  These hypermetabolic nodes about the diaphragm as well as effusions have resolved with treatment.  At this point it is difficult to a certain if there is residual lymphoma versus activity from the surrounding small bowel that is making SUV measurement difficult.  Residual masses can be seen after lymphoma treatment but that does not always signify active disease.  At this time I would favor repeating a PET/CT scan in 3 months time.  I did discuss the possibility of a repeat biopsy at this time which is difficult due to its current location. Radiation Oncology does not see a role for radiation at this time.  I will also offered patient the  option of second opinion at [facility]. Patient prefers to get repeat Pet/CT in 3 months and decide about second opinion   I will see him back in 3 months  time with cbc, cmp, ldh and pet/ct scan  Port flush every 6 weeks. Flu shot today     Visit Diagnosis 1. 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 Randa Evens, MD, MPH [place] at [facility] Pager- [phone] 08/20/2017  11:11 AM

## 2017-08-20 ENCOUNTER — Other Ambulatory Visit: Payer: Self-pay

## 2017-08-20 ENCOUNTER — Inpatient Hospital Stay (HOSPITAL_BASED_OUTPATIENT_CLINIC_OR_DEPARTMENT_OTHER): Payer: BLUE CROSS/BLUE SHIELD | Admitting: Oncology

## 2017-08-20 ENCOUNTER — Encounter: Payer: Self-pay | Admitting: Oncology

## 2017-08-20 VITALS — BP 164/72 | HR 101 | Temp 98.2°F | Resp 18 | Wt 287.8 lb

## 2017-08-20 DIAGNOSIS — Z87891 Personal history of nicotine dependence: Secondary | ICD-10-CM

## 2017-08-20 DIAGNOSIS — E785 Hyperlipidemia, unspecified: Secondary | ICD-10-CM

## 2017-08-20 DIAGNOSIS — Z8049 Family history of malignant neoplasm of other genital organs: Secondary | ICD-10-CM

## 2017-08-20 DIAGNOSIS — I10 Essential (primary) hypertension: Secondary | ICD-10-CM | POA: Diagnosis not present

## 2017-08-20 DIAGNOSIS — I700 Atherosclerosis of aorta: Secondary | ICD-10-CM | POA: Diagnosis not present

## 2017-08-20 DIAGNOSIS — K7460 Unspecified cirrhosis of liver: Secondary | ICD-10-CM

## 2017-08-20 DIAGNOSIS — R188 Other ascites: Secondary | ICD-10-CM

## 2017-08-20 DIAGNOSIS — Z7982 Long term (current) use of aspirin: Secondary | ICD-10-CM

## 2017-08-20 DIAGNOSIS — M1990 Unspecified osteoarthritis, unspecified site: Secondary | ICD-10-CM

## 2017-08-20 DIAGNOSIS — K760 Fatty (change of) liver, not elsewhere classified: Secondary | ICD-10-CM | POA: Diagnosis not present

## 2017-08-20 DIAGNOSIS — C8333 Diffuse large B-cell lymphoma, intra-abdominal lymph nodes: Secondary | ICD-10-CM | POA: Diagnosis not present

## 2017-08-20 DIAGNOSIS — R5381 Other malaise: Secondary | ICD-10-CM

## 2017-08-20 DIAGNOSIS — M109 Gout, unspecified: Secondary | ICD-10-CM

## 2017-08-20 DIAGNOSIS — Z23 Encounter for immunization: Secondary | ICD-10-CM

## 2017-08-20 DIAGNOSIS — Z809 Family history of malignant neoplasm, unspecified: Secondary | ICD-10-CM

## 2017-08-20 DIAGNOSIS — L409 Psoriasis, unspecified: Secondary | ICD-10-CM

## 2017-08-20 DIAGNOSIS — N400 Enlarged prostate without lower urinary tract symptoms: Secondary | ICD-10-CM

## 2017-08-20 DIAGNOSIS — C786 Secondary malignant neoplasm of retroperitoneum and peritoneum: Secondary | ICD-10-CM | POA: Diagnosis not present

## 2017-08-20 DIAGNOSIS — J90 Pleural effusion, not elsewhere classified: Secondary | ICD-10-CM | POA: Diagnosis not present

## 2017-08-20 DIAGNOSIS — K219 Gastro-esophageal reflux disease without esophagitis: Secondary | ICD-10-CM

## 2017-08-20 DIAGNOSIS — R5383 Other fatigue: Secondary | ICD-10-CM

## 2017-08-20 DIAGNOSIS — Z7952 Long term (current) use of systemic steroids: Secondary | ICD-10-CM

## 2017-08-20 DIAGNOSIS — Z79899 Other long term (current) drug therapy: Secondary | ICD-10-CM

## 2017-08-20 DIAGNOSIS — R590 Localized enlarged lymph nodes: Secondary | ICD-10-CM | POA: Diagnosis not present

## 2017-08-20 MED ORDER — INFLUENZA VAC SPLIT QUAD 0.5 ML IM SUSY
0.5000 mL | PREFILLED_SYRINGE | Freq: Once | INTRAMUSCULAR | Status: AC
  Administered 2017-08-20: 0.5 mL via INTRAMUSCULAR

## 2017-08-20 NOTE — Progress Notes (Signed)
Patient here today for PET results, denies concerns.

## 2017-08-25 ENCOUNTER — Other Ambulatory Visit: Payer: Self-pay | Admitting: Family Medicine

## 2017-09-12 ENCOUNTER — Inpatient Hospital Stay: Payer: BLUE CROSS/BLUE SHIELD | Attending: Oncology

## 2017-09-12 DIAGNOSIS — Z95828 Presence of other vascular implants and grafts: Secondary | ICD-10-CM

## 2017-09-12 DIAGNOSIS — C786 Secondary malignant neoplasm of retroperitoneum and peritoneum: Secondary | ICD-10-CM | POA: Insufficient documentation

## 2017-09-12 DIAGNOSIS — C8333 Diffuse large B-cell lymphoma, intra-abdominal lymph nodes: Secondary | ICD-10-CM | POA: Insufficient documentation

## 2017-09-12 DIAGNOSIS — Z9221 Personal history of antineoplastic chemotherapy: Secondary | ICD-10-CM | POA: Insufficient documentation

## 2017-09-12 DIAGNOSIS — Z452 Encounter for adjustment and management of vascular access device: Secondary | ICD-10-CM | POA: Insufficient documentation

## 2017-09-12 MED ORDER — HEPARIN SOD (PORK) LOCK FLUSH 100 UNIT/ML IV SOLN
500.0000 [IU] | INTRAVENOUS | Status: AC | PRN
  Administered 2017-09-12: 500 [IU]

## 2017-09-12 MED ORDER — SODIUM CHLORIDE 0.9% FLUSH
10.0000 mL | INTRAVENOUS | Status: AC | PRN
  Administered 2017-09-12: 10 mL
  Filled 2017-09-12: qty 10

## 2017-10-06 ENCOUNTER — Other Ambulatory Visit: Payer: Self-pay | Admitting: Urology

## 2017-10-24 ENCOUNTER — Inpatient Hospital Stay: Payer: BLUE CROSS/BLUE SHIELD

## 2017-10-24 ENCOUNTER — Inpatient Hospital Stay: Payer: BLUE CROSS/BLUE SHIELD | Admitting: Nurse Practitioner

## 2017-10-24 ENCOUNTER — Inpatient Hospital Stay: Payer: BLUE CROSS/BLUE SHIELD | Attending: Nurse Practitioner

## 2017-10-24 VITALS — BP 161/85 | HR 83 | Temp 97.6°F | Resp 18 | Wt 299.6 lb

## 2017-10-24 DIAGNOSIS — C786 Secondary malignant neoplasm of retroperitoneum and peritoneum: Secondary | ICD-10-CM | POA: Diagnosis not present

## 2017-10-24 DIAGNOSIS — C8333 Diffuse large B-cell lymphoma, intra-abdominal lymph nodes: Secondary | ICD-10-CM | POA: Insufficient documentation

## 2017-10-24 DIAGNOSIS — Z452 Encounter for adjustment and management of vascular access device: Secondary | ICD-10-CM | POA: Insufficient documentation

## 2017-10-24 DIAGNOSIS — Z95828 Presence of other vascular implants and grafts: Secondary | ICD-10-CM

## 2017-10-24 MED ORDER — SODIUM CHLORIDE 0.9% FLUSH
10.0000 mL | INTRAVENOUS | Status: AC | PRN
  Administered 2017-10-24: 10 mL
  Filled 2017-10-24: qty 10

## 2017-10-24 MED ORDER — HEPARIN SOD (PORK) LOCK FLUSH 100 UNIT/ML IV SOLN
500.0000 [IU] | INTRAVENOUS | Status: AC | PRN
  Administered 2017-10-24: 500 [IU]

## 2017-10-24 NOTE — Progress Notes (Signed)
Survivorship Care Plan visit completed.  Treatment summary reviewed and given to patient.  ASCO answers booklet reviewed and given to patient.  CARE program and Cancer Transitions discussed with patient along with other resources cancer center offers to patients and caregivers.  Patient verbalized understanding.    

## 2017-11-14 ENCOUNTER — Other Ambulatory Visit: Payer: Self-pay | Admitting: *Deleted

## 2017-11-14 ENCOUNTER — Inpatient Hospital Stay: Payer: BLUE CROSS/BLUE SHIELD | Attending: Oncology

## 2017-11-14 DIAGNOSIS — E785 Hyperlipidemia, unspecified: Secondary | ICD-10-CM | POA: Insufficient documentation

## 2017-11-14 DIAGNOSIS — K7460 Unspecified cirrhosis of liver: Secondary | ICD-10-CM | POA: Insufficient documentation

## 2017-11-14 DIAGNOSIS — Z95828 Presence of other vascular implants and grafts: Secondary | ICD-10-CM

## 2017-11-14 DIAGNOSIS — M109 Gout, unspecified: Secondary | ICD-10-CM | POA: Diagnosis not present

## 2017-11-14 DIAGNOSIS — C786 Secondary malignant neoplasm of retroperitoneum and peritoneum: Secondary | ICD-10-CM | POA: Insufficient documentation

## 2017-11-14 DIAGNOSIS — N401 Enlarged prostate with lower urinary tract symptoms: Secondary | ICD-10-CM | POA: Insufficient documentation

## 2017-11-14 DIAGNOSIS — C8333 Diffuse large B-cell lymphoma, intra-abdominal lymph nodes: Secondary | ICD-10-CM | POA: Insufficient documentation

## 2017-11-14 DIAGNOSIS — Z9221 Personal history of antineoplastic chemotherapy: Secondary | ICD-10-CM | POA: Diagnosis not present

## 2017-11-14 DIAGNOSIS — K219 Gastro-esophageal reflux disease without esophagitis: Secondary | ICD-10-CM | POA: Diagnosis not present

## 2017-11-14 DIAGNOSIS — Z87891 Personal history of nicotine dependence: Secondary | ICD-10-CM | POA: Diagnosis not present

## 2017-11-14 DIAGNOSIS — L409 Psoriasis, unspecified: Secondary | ICD-10-CM | POA: Insufficient documentation

## 2017-11-14 DIAGNOSIS — M1990 Unspecified osteoarthritis, unspecified site: Secondary | ICD-10-CM | POA: Diagnosis not present

## 2017-11-14 DIAGNOSIS — K760 Fatty (change of) liver, not elsewhere classified: Secondary | ICD-10-CM | POA: Diagnosis not present

## 2017-11-14 DIAGNOSIS — I10 Essential (primary) hypertension: Secondary | ICD-10-CM | POA: Insufficient documentation

## 2017-11-14 DIAGNOSIS — Z79899 Other long term (current) drug therapy: Secondary | ICD-10-CM | POA: Diagnosis not present

## 2017-11-14 LAB — COMPREHENSIVE METABOLIC PANEL
ALK PHOS: 54 U/L (ref 38–126)
ALT: 20 U/L (ref 17–63)
ANION GAP: 13 (ref 5–15)
AST: 25 U/L (ref 15–41)
Albumin: 3.7 g/dL (ref 3.5–5.0)
BILIRUBIN TOTAL: 0.5 mg/dL (ref 0.3–1.2)
BUN: 11 mg/dL (ref 6–20)
CALCIUM: 8.8 mg/dL — AB (ref 8.9–10.3)
CO2: 22 mmol/L (ref 22–32)
CREATININE: 0.72 mg/dL (ref 0.61–1.24)
Chloride: 102 mmol/L (ref 101–111)
Glucose, Bld: 146 mg/dL — ABNORMAL HIGH (ref 65–99)
Potassium: 3.3 mmol/L — ABNORMAL LOW (ref 3.5–5.1)
SODIUM: 137 mmol/L (ref 135–145)
TOTAL PROTEIN: 7 g/dL (ref 6.5–8.1)

## 2017-11-14 LAB — CBC WITH DIFFERENTIAL/PLATELET
BASOS ABS: 0 10*3/uL (ref 0–0.1)
BASOS PCT: 1 %
EOS ABS: 0.1 10*3/uL (ref 0–0.7)
Eosinophils Relative: 2 %
HCT: 38.8 % — ABNORMAL LOW (ref 40.0–52.0)
HEMOGLOBIN: 13.2 g/dL (ref 13.0–18.0)
Lymphocytes Relative: 10 %
Lymphs Abs: 0.7 10*3/uL — ABNORMAL LOW (ref 1.0–3.6)
MCH: 28.9 pg (ref 26.0–34.0)
MCHC: 34.1 g/dL (ref 32.0–36.0)
MCV: 84.7 fL (ref 80.0–100.0)
MONOS PCT: 11 %
Monocytes Absolute: 0.8 10*3/uL (ref 0.2–1.0)
Neutro Abs: 5.4 10*3/uL (ref 1.4–6.5)
Neutrophils Relative %: 76 %
Platelets: 221 10*3/uL (ref 150–440)
RBC: 4.58 MIL/uL (ref 4.40–5.90)
RDW: 13.8 % (ref 11.5–14.5)
WBC: 6.9 10*3/uL (ref 3.8–10.6)

## 2017-11-14 LAB — LACTATE DEHYDROGENASE: LDH: 123 U/L (ref 98–192)

## 2017-11-14 MED ORDER — HEPARIN SOD (PORK) LOCK FLUSH 100 UNIT/ML IV SOLN
500.0000 [IU] | INTRAVENOUS | Status: AC | PRN
  Administered 2017-11-14: 500 [IU]

## 2017-11-14 MED ORDER — SODIUM CHLORIDE 0.9% FLUSH
10.0000 mL | INTRAVENOUS | Status: AC | PRN
  Administered 2017-11-14: 10 mL
  Filled 2017-11-14: qty 10

## 2017-11-17 ENCOUNTER — Encounter
Admission: RE | Admit: 2017-11-17 | Discharge: 2017-11-17 | Disposition: A | Discharge status: Home / Self Care | Payer: BLUE CROSS/BLUE SHIELD | Source: Ambulatory Visit | Attending: Oncology | Admitting: Oncology

## 2017-11-17 DIAGNOSIS — I700 Atherosclerosis of aorta: Secondary | ICD-10-CM | POA: Diagnosis not present

## 2017-11-17 DIAGNOSIS — I517 Cardiomegaly: Secondary | ICD-10-CM | POA: Diagnosis not present

## 2017-11-17 DIAGNOSIS — K5730 Diverticulosis of large intestine without perforation or abscess without bleeding: Secondary | ICD-10-CM | POA: Insufficient documentation

## 2017-11-17 DIAGNOSIS — C8333 Diffuse large B-cell lymphoma, intra-abdominal lymph nodes: Secondary | ICD-10-CM

## 2017-11-17 DIAGNOSIS — J90 Pleural effusion, not elsewhere classified: Secondary | ICD-10-CM | POA: Insufficient documentation

## 2017-11-17 LAB — GLUCOSE, CAPILLARY: Glucose-Capillary: 167 mg/dL — ABNORMAL HIGH (ref 65–99)

## 2017-11-17 MED ORDER — FLUDEOXYGLUCOSE F - 18 (FDG) INJECTION
12.0000 | Freq: Once | INTRAVENOUS | Status: AC | PRN
  Administered 2017-11-17: 12.77 via INTRAVENOUS

## 2017-11-21 ENCOUNTER — Inpatient Hospital Stay (HOSPITAL_BASED_OUTPATIENT_CLINIC_OR_DEPARTMENT_OTHER): Payer: BLUE CROSS/BLUE SHIELD | Admitting: Oncology

## 2017-11-21 ENCOUNTER — Telehealth: Payer: Self-pay | Admitting: Gastroenterology

## 2017-11-21 ENCOUNTER — Other Ambulatory Visit: Payer: Self-pay

## 2017-11-21 ENCOUNTER — Encounter: Payer: Self-pay | Admitting: Oncology

## 2017-11-21 VITALS — BP 165/83 | HR 88 | Temp 98.0°F | Resp 16 | Ht 70.0 in | Wt 301.0 lb

## 2017-11-21 DIAGNOSIS — K760 Fatty (change of) liver, not elsewhere classified: Secondary | ICD-10-CM | POA: Diagnosis not present

## 2017-11-21 DIAGNOSIS — C8333 Diffuse large B-cell lymphoma, intra-abdominal lymph nodes: Secondary | ICD-10-CM

## 2017-11-21 DIAGNOSIS — M1990 Unspecified osteoarthritis, unspecified site: Secondary | ICD-10-CM

## 2017-11-21 DIAGNOSIS — N401 Enlarged prostate with lower urinary tract symptoms: Secondary | ICD-10-CM

## 2017-11-21 DIAGNOSIS — I10 Essential (primary) hypertension: Secondary | ICD-10-CM | POA: Diagnosis not present

## 2017-11-21 DIAGNOSIS — K219 Gastro-esophageal reflux disease without esophagitis: Secondary | ICD-10-CM

## 2017-11-21 DIAGNOSIS — K7460 Unspecified cirrhosis of liver: Secondary | ICD-10-CM | POA: Diagnosis not present

## 2017-11-21 DIAGNOSIS — Z87891 Personal history of nicotine dependence: Secondary | ICD-10-CM

## 2017-11-21 DIAGNOSIS — Z9221 Personal history of antineoplastic chemotherapy: Secondary | ICD-10-CM | POA: Diagnosis not present

## 2017-11-21 DIAGNOSIS — M109 Gout, unspecified: Secondary | ICD-10-CM

## 2017-11-21 DIAGNOSIS — L409 Psoriasis, unspecified: Secondary | ICD-10-CM | POA: Diagnosis not present

## 2017-11-21 DIAGNOSIS — C786 Secondary malignant neoplasm of retroperitoneum and peritoneum: Secondary | ICD-10-CM

## 2017-11-21 DIAGNOSIS — Z79899 Other long term (current) drug therapy: Secondary | ICD-10-CM

## 2017-11-21 DIAGNOSIS — E785 Hyperlipidemia, unspecified: Secondary | ICD-10-CM | POA: Diagnosis not present

## 2017-11-21 NOTE — Progress Notes (Signed)
Patient here for follow up. States his legs ache.

## 2017-11-21 NOTE — Progress Notes (Signed)
Hematology/Oncology Consult note [facility]  [phone] [phone]  Patient Care Team: [name]., MD as PCP - General (Family Medicine) [name], MD as Consulting Physician (Pulmonary Disease) Sindy Guadeloupe, MD as Consulting Physician (Oncology) Verlon Au, NP as Nurse Practitioner (Nurse Practitioner) Leona Singleton, RN as Oncology Nurse Navigator   Name of the patient: Peter Ryan  [id number]  April 26, 1951   Date of visit: 11/21/17  Diagnosis- Stage IV DLBCL GCB IPI score- low intermediate risk   Chief complaint/ Reason for visit- discuss pet ct results  Heme/Onc history: 1. Patient is a 67 year old male who initially presented with symptoms of worsening shortness of breathand was found to have right pleural effusion on chest x-ray. CT chest with contrast on 11/12/2016 revealed multiple lymph nodes throughout the mediastinum measuring approximately 1 cm in short axis. Upper abdomen showed evidence of ascites and mild cirrhotic changes in the liver. Patient underwent thoracocentesis for recurrent right pleural effusion. Fluid was exudative in nature and LDH in the pleural fluid was elevated. Cytology was negative for malignancy. Patient also had peripheral flow cytometry done which was negative for malignancy. Pleural and peritoneal fluid cytology has been negative for malignancy/ lymphoma  2. Ultrasound of the abdomen on 01/24/2017 showed sludge in the gallbladder and parenchymal of the liver suggesting cirrhosis as well as moderate ascites. Initially his ascites with attribute it to his cirrhosis and nonalcoholic fatty liver disease. He was also treated for culture negative neutrophilic SBP based on asciticfluid analysis. Given that the ascites fluid was exudative in nature which is not consistent with cirrhosis from nonalcoholic fatty liver disease as well as no evidence of splenomegaly or abnormal LFTs, CT  abdomen was obtained to rule out any other etiology for ascites  3. CT abdomen on 03/21/2017 showed: IMPRESSION: 1. Bulky central mesenteric mass lesion encasing major mesenteric vascular anatomy without substantial mass-effect. Diffuse omental caking is evident and peritoneal enhancement is identified. There is associated mesenteric, juxta diaphragmatic, retroperitoneal, and retrocrural lymphadenopathy. Lymphoma would be a consideration. Metastatic disease could also have this appearance. 2. Small a moderate volume ascites  4. Patient denies any unintentional weight loss. His appetite is fair denies any drenching night sweats or recurrent fevers. Does report fatigue  5. PET CT showed: IMPRESSION: 1. Large hypermetabolic left eccentric mesenteric mass with extensive omental caking and peritoneal spread of tumor. This mechanism of spread would seem to favor a gastrointestinal primary, with entities such as carcinoid tumor or lymphoma as differential diagnostic considerations. Tissue diagnosis recommended. 2. Hypermetabolic mediastinal adenopathy compatible with malignancy. Moderate to large bilateral pleural effusions with passive atelectasis and low-grade activity along the pleural fluid-early malignant pleural effusions not excluded. 3. Aortic Atherosclerosis (ICD10-I70.0). 4. No definite involvement of the neck or skeleton. 5. Moderately enlarged prostate gland.  6. Omental biopsy showed: DIAGNOSIS:  A. SOFT TISSUE MASS, LEFT MESENTERY; CT-GUIDED CORE BIOPSY:  - LARGE B-CELL LYMPHOMA, CD10 POSITIVE  FISH testing for BCL 6 and cmycwas negative. Therefore he does not have a double hit lymphoma. Bone marrow biopsy was negative for lymphoma. Normal LDH. He did not meet criteria for cns prophylaxis. His only risk factors being age and stage IV  Interim PET/CT after 3 cycles should excellent response to treatment  Repeat PET/CT scan after 6 cycles of R CHOP showed:Grossly  similar size of small bowel mesenteric mass, somewhat difficult to measure secondary to its morphology. Slight increase in mild to moderate hypermetabolism. 2. No  evidence of extr abdominal hypermetabolic lymphoma. 3. Decrease in small right pleural effusion.   Interval history-overall he is doing well and he denies any symptoms of abdominal pain shortness of breath fatigue or unintentional weight loss.  In fact he has gained weight and admits to eating unhealthy foods of late.  Denies any tingling numbness in his extremities but reports that occasionally his legs feel weak and tired  ECOG PS- 0 Pain scale- 0   Review of systems- Review of Systems  Constitutional: Negative for chills, fever, malaise/fatigue and weight loss.  HENT: Negative for congestion, ear discharge and nosebleeds.   Eyes: Negative for blurred vision.  Respiratory: Negative for cough, hemoptysis, sputum production, shortness of breath and wheezing.   Cardiovascular: Negative for chest pain, palpitations, orthopnea and claudication.  Gastrointestinal: Negative for abdominal pain, blood in stool, constipation, diarrhea, heartburn, melena, nausea and vomiting.  Genitourinary: Negative for dysuria, flank pain, frequency, hematuria and urgency.  Musculoskeletal: Negative for back pain, joint pain and myalgias.  Skin: Negative for rash.  Neurological: Negative for dizziness, tingling, focal weakness, seizures, weakness and headaches.  Endo/Heme/Allergies: Does not bruise/bleed easily.  Psychiatric/Behavioral: Negative for depression and suicidal ideas. The patient does not have insomnia.       Allergies  Allergen Reactions  . Metformin Other (See Comments)    Stomach upset      Past Medical History:  Diagnosis Date  . Arthritis   . BPH (benign prostatic hyperplasia)   . Cancer ([place])   . Chronic prostatitis   . Cirrhosis of liver ([place])   . Dyslipidemia   . Elevated PSA   . Gastric reflux   . Gout   . HTN  (hypertension)   . Over weight   . Psoriasis   . Testicular pain, right   . Urinary frequency      Past Surgical History:  Procedure Laterality Date  . hydrocelectomy    . IR FLUORO GUIDE PORT INSERTION RIGHT  04/11/2017  . PILONIDAL CYST EXCISION      Social History   Socioeconomic History  . Marital status: Married    Spouse name: Not on file  . Number of children: Not on file  . Years of education: Not on file  . Highest education level: Not on file  Social Needs  . Financial resource strain: Not on file  . Food insecurity - worry: Not on file  . Food insecurity - inability: Not on file  . Transportation needs - medical: Not on file  . Transportation needs - non-medical: Not on file  Occupational History  . Not on file  Tobacco Use  . Smoking status: Former Smoker    Packs/day: 1.50    Years: 8.00    Pack years: 12.00    Types: Cigarettes  . Smokeless tobacco: Never Used  . Tobacco comment: quit 40 years ago  Substance and Sexual Activity  . Alcohol use: No    Alcohol/week: 0.0 oz    Comment: rarely  . Drug use: No  . Sexual activity: Not on file  Other Topics Concern  . Not on file  Social History Narrative  . Not on file    Family History  Problem Relation Age of Onset  . Uterine cancer Mother   . Diabetes Mother   . Cancer Mother   . Cancer Maternal Uncle   . Bladder Cancer Neg Hx   . Kidney disease Neg Hx   . Prostate cancer Neg Hx  Current Outpatient Medications:  .  allopurinol (ZYLOPRIM) 300 MG tablet, Take 1 tablet (300 mg total) by mouth daily., Disp: 30 tablet, Rfl: 3 .  amLODipine (NORVASC) 10 MG tablet, TAKE ONE TABLET BY MOUTH EVERY DAY, Disp: 30 tablet, Rfl: 3 .  aspirin EC 81 MG tablet, Take 81 mg by mouth daily. , Disp: , Rfl:  .  Cholecalciferol (VITAMIN D3) 1000 UNITS CAPS, Take 1,000 Units by mouth daily. , Disp: , Rfl:  .  lidocaine-prilocaine (EMLA) cream, Apply to affected area once, Disp: 30 g, Rfl: 3 .  loratadine  (CLARITIN) 10 MG tablet, Take 10 mg by mouth daily as needed for allergies., Disp: , Rfl:  .  losartan (COZAAR) 50 MG tablet, TAKE 1 TABLET BY MOUTH DAILY, Disp: 90 tablet, Rfl: 0  Physical exam:  Vitals:   11/21/17 0951 11/21/17 1004  BP:  (!) 165/83  Pulse:  88  Resp: 16   Temp:  98 F (36.7 C)  TempSrc:  Tympanic  Weight: (!) 301 lb (136.5 kg)   Height: '5\' 10"'$  (1.778 m)    Physical Exam  Constitutional: He is oriented to person, place, and time and well-developed, well-nourished, and in no distress.  HENT:  Head: Normocephalic and atraumatic.  Eyes: EOM are normal. Pupils are equal, round, and reactive to light.  Neck: Normal range of motion.  Cardiovascular: Normal rate, regular rhythm and normal heart sounds.  Pulmonary/Chest: Effort normal and breath sounds normal.  Abdominal: Soft. Bowel sounds are normal.  Neurological: He is alert and oriented to person, place, and time.  Skin: Skin is warm and dry.     CMP Latest Ref Rng & Units 11/14/2017  Glucose 65 - 99 mg/dL 146(H)  BUN 6 - 20 mg/dL 11  Creatinine 0.61 - 1.24 mg/dL 0.72  Sodium 135 - 145 mmol/L 137  Potassium 3.5 - 5.1 mmol/L 3.3(L)  Chloride 101 - 111 mmol/L 102  CO2 22 - 32 mmol/L 22  Calcium 8.9 - 10.3 mg/dL 8.8(L)  Total Protein 6.5 - 8.1 g/dL 7.0  Total Bilirubin 0.3 - 1.2 mg/dL 0.5  Alkaline Phos 38 - 126 U/L 54  AST 15 - 41 U/L 25  ALT 17 - 63 U/L 20   CBC Latest Ref Rng & Units 11/14/2017  WBC 3.8 - 10.6 K/uL 6.9  Hemoglobin 13.0 - 18.0 g/dL 13.2  Hematocrit 40.0 - 52.0 % 38.8(L)  Platelets 150 - 440 K/uL 221    No images are attached to the encounter.  Nm Pet Image Restag (ps) Skull Base To Thigh  Result Date: 11/17/2017 CLINICAL DATA:  Subsequent treatment strategy for diffuse large B-cell lymphoma. EXAM: NUCLEAR MEDICINE PET SKULL BASE TO THIGH TECHNIQUE: 12.77 mCi F-18 FDG was injected intravenously. Full-ring PET imaging was performed from the skull base to thigh after the radiotracer. CT  data was obtained and used for attenuation correction and anatomic localization. FASTING BLOOD GLUCOSE:  Value: 167 mg/dl COMPARISON:  PET-CT 06/17/2017 and 08/13/2017. FINDINGS: NECK No hypermetabolic cervical lymph nodes are identified.There are no lesions of the pharyngeal mucosal space. CHEST There are no hypermetabolic mediastinal, hilar or axillary lymph nodes. There is no suspicious pulmonary activity. The right pleural effusion has resolved. Right IJ Port-A-Cath extends to the superior cavoatrial junction. There is atherosclerosis of the coronary arteries. ABDOMEN/PELVIS There is no hypermetabolic activity within the liver, adrenal glands, spleen or pancreas. Blood pool activity is approximately 3.2 SUV max. Hepatic activity 5.3 SUV max. Known left mesenteric mass demonstrates no residual  hypermetabolic activity (SUV max 4.1), similar to additional peritoneal contents. The lesion is self is not well visualized on this noncontrast study. Stable incidental findings including hepatic steatosis, a left renal cyst and aortic atherosclerosis. There are diverticular changes of the sigmoid colon. SKELETON There is no hypermetabolic activity to suggest osseous metastatic disease. IMPRESSION: 1. Known left mesenteric mass is not well visualized on the noncontrast CT images, but demonstrates no metabolic activity above background. This is consistent with treated lymphoma. 2. No new areas of hypermetabolic activity. 3. Resolved right pleural effusion. Additional incidental findings are stable, including hepatic steatosis and atherosclerosis. Electronically Signed   By: Richardean Sale M.D.   On: 11/17/2017 11:32     Assessment and plan- Patient is a 67 y.o. male h/o Stage IV DLBCL GCB, not double hit, low intermediaterisk IPI status post 6 cycles of R CHOP chemotherapy   I have personally reviewed PET/CT scan images independently and I discussed the findings with the patient.  Overall patient has had an  excellent response to chemotherapy and he is in CR 1 at this time.  He does have residual mesenteric mass with no appreciable hypermetabolism.  He is currently in remission from his lymphoma.  NCCN. Guidelines typically did not recommend surveillance scans and diffuse large B-cell lymphoma.  However given that he had significant burden of disease and stage IV disease at presentation I am inclined to do PET/CT scan in 6 months time.  I will see him back in 3 months with CBC CMP and LDH for routine history and physical.    He will continue to get port flushes every 8 weeks  Encouraged to focus on healthy eating habits and weight loss especially since he has fatty liver and cirrhosis. He has not seen GI since June 2019 but may need follow up with them in the future given his cirrhosis and NAFLD   Visit Diagnosis 1. 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 Randa Evens, MD, MPH [facility] at [facility] Pager- [phone] 11/21/2017 12:51 PM

## 2017-11-21 NOTE — Telephone Encounter (Signed)
Peter Ryan, CMA  Peter Ryan, Peter Ryan        Please schedule an office visit with Dr. Vicente Males.   Thanks  [name]

## 2017-11-26 ENCOUNTER — Ambulatory Visit: Payer: BLUE CROSS/BLUE SHIELD | Admitting: Urology

## 2017-11-26 ENCOUNTER — Encounter: Payer: Self-pay | Admitting: Urology

## 2017-11-26 VITALS — BP 164/81 | HR 113 | Ht 70.0 in | Wt 298.0 lb

## 2017-11-26 DIAGNOSIS — N138 Other obstructive and reflux uropathy: Secondary | ICD-10-CM | POA: Diagnosis not present

## 2017-11-26 DIAGNOSIS — N401 Enlarged prostate with lower urinary tract symptoms: Secondary | ICD-10-CM

## 2017-11-26 DIAGNOSIS — Z87898 Personal history of other specified conditions: Secondary | ICD-10-CM | POA: Diagnosis not present

## 2017-11-26 MED ORDER — FINASTERIDE 5 MG PO TABS: 5.0000 mg | ORAL_TABLET | Freq: Every day | ORAL | 3 refills | Status: DC

## 2017-11-26 NOTE — Progress Notes (Signed)
11/26/2017 9:43 AM   Peter Ryan Mar 03, 1951 [id number]  Referring provider: [name]., MD [address] [place], [place] [address]  Chief Complaint  Patient presents with  . Medication Refill    HPI: 67 year old Caucasian male with a history of large B-cell lymphoma with BPH with lower urinary tract symptoms and an elevated PSA who presents today to reestablish care.    BPH with LUTS I PSS score is 3/1.  Previous I PSS score was 5/1.  He has been without his finasteride for 6 weeks.  He has noticed the return of his perineal ache.  Patient denies any gross hematuria, dysuria or suprapubic/flank pain.  Patient denies any fevers, chills, nausea or vomiting.   IPSS    Row Name 11/26/17 0900         International Prostate Symptom Score   How often have you had the sensation of not emptying your bladder?  Not at All     How often have you had to urinate less than every two hours?  Less than half the time     How often have you found you stopped and started again several times when you urinated?  Not at All     How often have you found it difficult to postpone urination?  Not at All     How often have you had a weak urinary stream?  Not at All     How often have you had to strain to start urination?  Not at All     How many times did you typically get up at night to urinate?  1 Time     Total IPSS Score  3       Quality of Life due to urinary symptoms   If you were to spend the rest of your life with your urinary condition just the way it is now how would you feel about that?  Pleased        Score:  1-7 Mild 8-19 Moderate 20-35 Severe  History of elevated PSA Patient was found to have an elevated PSA of 7.5 ng/mL in 05/2014.  He did have an infection at that time.  His PSA's have been decreasing.  His most recent PSA was 5.3 ng/mL on 07/18/2016. Patient was lost to follow up due to his being diagnosed and treated for lymphoma.  PSA will be drawn  at Dr. Alben Spittle office.    PMH: Past Medical History:  Diagnosis Date  . Arthritis   . BPH (benign prostatic hyperplasia)   . Cancer ([place])   . Chronic prostatitis   . Cirrhosis of liver ([place])   . Dyslipidemia   . Elevated PSA   . Gastric reflux   . Gout   . HTN (hypertension)   . Over weight   . Psoriasis   . Testicular pain, right   . Urinary frequency     Surgical History: Past Surgical History:  Procedure Laterality Date  . hydrocelectomy    . IR FLUORO GUIDE PORT INSERTION RIGHT  04/11/2017  . PILONIDAL CYST EXCISION      Home Medications:  Allergies as of 11/26/2017      Reactions   Metformin Other (See Comments)   Stomach upset      Medication List        Accurate as of 11/26/17  9:43 AM. Always use your most recent med list.          allopurinol 300 MG tablet  Commonly known as:  ZYLOPRIM Take 1 tablet (300 mg total) by mouth daily.   amLODipine 10 MG tablet Commonly known as:  NORVASC TAKE ONE TABLET BY MOUTH EVERY DAY   aspirin EC 81 MG tablet Take 81 mg by mouth daily.   finasteride 5 MG tablet Commonly known as:  PROSCAR Take 1 tablet (5 mg total) by mouth daily.   lidocaine-prilocaine cream Commonly known as:  EMLA Apply to affected area once   loratadine 10 MG tablet Commonly known as:  CLARITIN Take 10 mg by mouth daily as needed for allergies.   losartan 50 MG tablet Commonly known as:  COZAAR TAKE 1 TABLET BY MOUTH DAILY   Vitamin D3 1000 units Caps Take 1,000 Units by mouth daily.       Allergies:  Allergies  Allergen Reactions  . Metformin Other (See Comments)    Stomach upset     Family History: Family History  Problem Relation Age of Onset  . Uterine cancer Mother   . Diabetes Mother   . Cancer Mother   . Cancer Maternal Uncle   . Bladder Cancer Neg Hx   . Kidney disease Neg Hx   . Prostate cancer Neg Hx     Social History:  reports that he has quit smoking. His smoking use included cigarettes. He has a  12.00 pack-year smoking history. he has never used smokeless tobacco. He reports that he does not drink alcohol or use drugs.  ROS: UROLOGY Frequent Urination?: No Hard to postpone urination?: No Burning/pain with urination?: No Get up at night to urinate?: No Leakage of urine?: No Urine stream starts and stops?: No Trouble starting stream?: No Do you have to strain to urinate?: No Blood in urine?: No Urinary tract infection?: No Sexually transmitted disease?: No Injury to kidneys or bladder?: No Painful intercourse?: No Weak stream?: No Erection problems?: No Penile pain?: No  Gastrointestinal Nausea?: No Vomiting?: No Indigestion/heartburn?: No Diarrhea?: No Constipation?: No  Constitutional Fever: No Night sweats?: No Weight loss?: No Fatigue?: No  Skin Skin rash/lesions?: No Itching?: No  Eyes Blurred vision?: No Double vision?: No  Ears/Nose/Throat Sore throat?: No Sinus problems?: No  Hematologic/Lymphatic Swollen glands?: No Easy bruising?: No  Cardiovascular Leg swelling?: No Chest pain?: No  Respiratory Cough?: No Shortness of breath?: No  Endocrine Excessive thirst?: No  Musculoskeletal Back pain?: No Joint pain?: No  Neurological Headaches?: No Dizziness?: No  Psychologic Depression?: No Anxiety?: No  Physical Exam: BP (!) 164/81   Pulse (!) 113   Ht 5\' 10"  (1.778 m)   Wt 298 lb (135.2 kg)   BMI 42.76 kg/m   Constitutional: Well nourished. Alert and oriented, No acute distress. HEENT: [place] AT, moist mucus membranes. Trachea midline, no masses. Cardiovascular: No clubbing, cyanosis, or edema. Respiratory: Normal respiratory effort, no increased work of breathing. GI: Abdomen is soft, non tender, non distended, no abdominal masses. Liver and spleen not palpable.  No hernias appreciated.  Stool sample for occult testing is not indicated.   GU: No CVA tenderness.  No bladder fullness or masses.  Patient with uncircumcised  phallus.  Foreskin easily retracted  Urethral meatus is patent.  No penile discharge. No penile lesions or rashes. Scrotum without lesions, cysts, rashes and/or edema.  Testicles are located scrotally bilaterally. No masses are appreciated in the testicles. Left and right epididymis are normal. Rectal: Patient with  normal sphincter tone. Anus and perineum without scarring or rashes. No rectal masses are appreciated. Prostate is  approximately 55 grams, no nodules are appreciated. Seminal vesicles are normal. Skin: No rashes, bruises or suspicious lesions. Lymph: No cervical or inguinal adenopathy. Neurologic: Grossly intact, no focal deficits, moving all 4 extremities. Psychiatric: Normal mood and affect.  Laboratory Data: Lab Results  Component Value Date   WBC 6.9 11/14/2017   HGB 13.2 11/14/2017   HCT 38.8 (L) 11/14/2017   MCV 84.7 11/14/2017   PLT 221 11/14/2017    Lab Results  Component Value Date   CREATININE 0.72 11/14/2017    No results found for: PSA  No results found for: TESTOSTERONE  Lab Results  Component Value Date   HGBA1C 6.3 (H) 10/29/2016    Lab Results  Component Value Date   TSH 1.300 02/07/2017       Component Value Date/Time   CHOL 181 07/18/2016 0820   HDL 28 (L) 07/18/2016 0820   LDLCALC 86 07/18/2016 0820    Lab Results  Component Value Date   AST 25 11/14/2017   Lab Results  Component Value Date   ALT 20 11/14/2017   No components found for: ALKALINEPHOPHATASE No components found for: BILIRUBINTOTAL  No results found for: ESTRADIOL  Urinalysis    Component Value Date/Time   APPEARANCEUR Clear 07/18/2016 0927   GLUCOSEU Negative 07/18/2016 0927   BILIRUBINUR Negative 07/18/2016 0927   PROTEINUR 1+ (A) 07/18/2016 0927   UROBILINOGEN 0.2 06/14/2016 1419   NITRITE Negative 07/18/2016 0927   LEUKOCYTESUR Negative 07/18/2016 0927    I have reviewed the labs.   Pertinent Imaging: n/a   Assessment & Plan:    1. BPH with  LUTS  - IPSS score is 3/1, it is improving  - Continue conservative management, avoiding bladder irritants and timed voiding's  - most bothersome symptoms is/are perineal ache  - Restart tamsulosin finasteride 5 mg daily: refills given  - RTC pending PSA results  2. History of elevated PSA             - Patient was initially found to have an elevated PSA of 7.5 in September 2015             - Most recent PSA was 5.3 ng/mL on 07/18/2016                 - PSA will be drawn at Dr. Alben Spittle office   Return for pending PSA results .  These notes generated with voice recognition software. I apologize for typographical errors.  Zara Council, [facility] [address] [place], [place] [address] [phone]

## 2017-12-02 ENCOUNTER — Ambulatory Visit: Payer: BLUE CROSS/BLUE SHIELD | Admitting: Family Medicine

## 2017-12-02 ENCOUNTER — Other Ambulatory Visit: Payer: Self-pay

## 2017-12-02 VITALS — BP 170/90 | HR 94 | Temp 97.9°F | Resp 18 | Wt 302.0 lb

## 2017-12-02 DIAGNOSIS — E118 Type 2 diabetes mellitus with unspecified complications: Secondary | ICD-10-CM

## 2017-12-02 DIAGNOSIS — I10 Essential (primary) hypertension: Secondary | ICD-10-CM | POA: Diagnosis not present

## 2017-12-02 DIAGNOSIS — M109 Gout, unspecified: Secondary | ICD-10-CM

## 2017-12-02 DIAGNOSIS — R972 Elevated prostate specific antigen [PSA]: Secondary | ICD-10-CM

## 2017-12-02 DIAGNOSIS — E785 Hyperlipidemia, unspecified: Secondary | ICD-10-CM | POA: Diagnosis not present

## 2017-12-02 DIAGNOSIS — Z794 Long term (current) use of insulin: Secondary | ICD-10-CM

## 2017-12-02 MED ORDER — LOSARTAN POTASSIUM 100 MG PO TABS: 50.0000 mg | ORAL_TABLET | Freq: Every day | ORAL | 3 refills | Status: DC

## 2017-12-02 NOTE — Progress Notes (Signed)
Peter Ryan  MRN: [id number] DOB: 07-01-1951  Subjective:  HPI   The patient is a 67 year old male who presents for follow up of his chronic illness.  He was last seen at the beginning of 2018.  He has recently finish chemotherapy.  Hypertension-He states that his normal blood pressure has been running about 160 over 80's.  He is currently on Losartan and Amlodipine.    Hyperglycemia-the patient has had elevated glucose with A1C running 6-6.5.  He does not take any medication for this and does not check his glucose at home.    Hyperlipidemia-the patient has had elevated triglycerides but is not on any treatment.    Patient Active Problem List   Diagnosis Date Noted  . Goals of care, counseling/discussion 04/08/2017  . 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 ([place]) 04/08/2017  . Ascites 02/08/2017  . Liver cirrhosis ([place]) 02/08/2017  . SBP (spontaneous bacterial peritonitis) ([place]) 02/07/2017  . 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  . DOE (dyspnea on exertion) 11/15/2016  . Obesity 11/15/2016  . Elevated PSA 05/30/2015  . BPH with obstruction/lower urinary tract symptoms 05/30/2015  . Type 2 diabetes mellitus ([place]) 05/18/2014  . Arthritis 01/03/2014  . Dyslipidemia 01/03/2014  . Benign essential hypertension 01/03/2014  . Gout 01/03/2014  . Psoriasis 01/03/2014  . Reflux 01/03/2014    Past Medical History:  Diagnosis Date  . Arthritis   . BPH (benign prostatic hyperplasia)   . Cancer ([place])   . Chronic prostatitis   . Cirrhosis of liver ([place])   . Dyslipidemia   . Elevated PSA   . Gastric reflux   . Gout   . HTN (hypertension)   . Over weight   . Psoriasis   . Testicular pain, right   . Urinary frequency     Social History   Socioeconomic History  . Marital status: Married    Spouse name: Not on file  . Number of children: Not on file  . Years of education: Not on file  . Highest education level: Not on file    Social Needs  . Financial resource strain: Not on file  . Food insecurity - worry: Not on file  . Food insecurity - inability: Not on file  . Transportation needs - medical: Not on file  . Transportation needs - non-medical: Not on file  Occupational History  . Not on file  Tobacco Use  . Smoking status: Former Smoker    Packs/day: 1.50    Years: 8.00    Pack years: 12.00    Types: Cigarettes  . Smokeless tobacco: Never Used  . Tobacco comment: quit 40 years ago  Substance and Sexual Activity  . Alcohol use: No    Alcohol/week: 0.0 oz    Comment: rarely  . Drug use: No  . Sexual activity: Not on file  Other Topics Concern  . Not on file  Social History Narrative  . Not on file    Outpatient Encounter Medications as of 12/02/2017  Medication Sig  . allopurinol (ZYLOPRIM) 300 MG tablet Take 1 tablet (300 mg total) by mouth daily.  Marland Kitchen amLODipine (NORVASC) 10 MG tablet TAKE ONE TABLET BY MOUTH EVERY DAY  . aspirin EC 81 MG tablet Take 81 mg by mouth daily.   . Cholecalciferol (VITAMIN D3) 1000 UNITS CAPS Take 1,000 Units by mouth daily.   . finasteride (PROSCAR) 5 MG tablet Take 1 tablet (5 mg total) by mouth daily.  Marland Kitchen losartan (  COZAAR) 50 MG tablet TAKE 1 TABLET BY MOUTH DAILY  . [DISCONTINUED] lidocaine-prilocaine (EMLA) cream Apply to affected area once  . [DISCONTINUED] loratadine (CLARITIN) 10 MG tablet Take 10 mg by mouth daily as needed for allergies.   No facility-administered encounter medications on file as of 12/02/2017.     Allergies  Allergen Reactions  . Metformin Other (See Comments)    Stomach upset     Review of Systems  Constitutional: Positive for malaise/fatigue. Negative for fever.  Respiratory: Negative for cough, shortness of breath and wheezing.   Cardiovascular: Positive for leg swelling (ankle swelling). Negative for chest pain, palpitations, orthopnea and claudication.  Genitourinary: Negative for frequency.  Neurological: Positive for  weakness.  Endo/Heme/Allergies: Negative for polydipsia.  Psychiatric/Behavioral: Negative.     Objective:  BP (!) 170/90 (BP Location: Left Arm, Patient Position: Sitting, Cuff Size: Large)   Pulse 94   Temp 97.9 F (36.6 C) (Oral)   Resp 18   Wt (!) 302 lb (137 kg)   BMI 43.33 kg/m   Physical Exam  Constitutional: He is oriented to person, place, and time and well-developed, well-nourished, and in no distress.  HENT:  Head: Normocephalic and atraumatic.  Eyes: Conjunctivae are normal. No scleral icterus.  Neck: No thyromegaly present.  Cardiovascular: Normal rate, regular rhythm and normal heart sounds.  Pulmonary/Chest: Effort normal and breath sounds normal.  Abdominal: Soft.  Lymphadenopathy:    He has no cervical adenopathy.  Neurological: He is alert and oriented to person, place, and time. Gait normal. GCS score is 15.  Skin: Skin is warm and dry.  Psychiatric: Mood, memory, affect and judgment normal.    Assessment and Plan :  1. Benign essential hypertension  - CBC with Differential/Platelet - TSH  2. Type 2 diabetes mellitus with complication, with long-term current use of insulin (HCC)  - Hemoglobin A1c  3. Dyslipidemia  - Lipid panel - Comprehensive metabolic panel  4. Elevated PSA  - PSA  5. Gout, unspecified cause, unspecified chronicity, unspecified site  - Uric acid 6.Morbid Obesity 7.ASCVD 8.Bcell Lymphoma 9.HTN Increase losartan to 100mg  daily.  I have done the exam and reviewed the chart and it is accurate to the best of my knowledge. Development worker, community has been used and  any errors in dictation or transcription are unintentional. Miguel Aschoff M.D. [facility]

## 2017-12-03 ENCOUNTER — Telehealth: Payer: Self-pay

## 2017-12-03 DIAGNOSIS — I10 Essential (primary) hypertension: Secondary | ICD-10-CM

## 2017-12-03 MED ORDER — LOSARTAN POTASSIUM 100 MG PO TABS: 100.0000 mg | ORAL_TABLET | Freq: Every day | ORAL | 3 refills | Status: DC

## 2017-12-03 NOTE — Telephone Encounter (Signed)
Amy from [facility] called requesting verification on dose of Losartan. Pt believed this was going to be increased to 100 mg, but the rx they received was Losartan 100 mg take 0.5 tabs po qd. Please advise. Call Back [phone].

## 2017-12-03 NOTE — Telephone Encounter (Signed)
Yes. It was increased to 100 mg. Resent rx. pharmacy informed.

## 2017-12-11 ENCOUNTER — Other Ambulatory Visit: Payer: Self-pay | Admitting: Family Medicine

## 2017-12-22 ENCOUNTER — Other Ambulatory Visit: Payer: Self-pay | Admitting: Family Medicine

## 2017-12-22 DIAGNOSIS — I10 Essential (primary) hypertension: Secondary | ICD-10-CM

## 2017-12-26 ENCOUNTER — Inpatient Hospital Stay: Payer: BLUE CROSS/BLUE SHIELD | Attending: Oncology

## 2017-12-26 DIAGNOSIS — Z452 Encounter for adjustment and management of vascular access device: Secondary | ICD-10-CM | POA: Insufficient documentation

## 2017-12-26 DIAGNOSIS — Z9221 Personal history of antineoplastic chemotherapy: Secondary | ICD-10-CM | POA: Diagnosis not present

## 2017-12-26 DIAGNOSIS — C786 Secondary malignant neoplasm of retroperitoneum and peritoneum: Secondary | ICD-10-CM | POA: Diagnosis not present

## 2017-12-26 DIAGNOSIS — C8333 Diffuse large B-cell lymphoma, intra-abdominal lymph nodes: Secondary | ICD-10-CM | POA: Diagnosis not present

## 2017-12-26 DIAGNOSIS — Z95828 Presence of other vascular implants and grafts: Secondary | ICD-10-CM

## 2017-12-26 MED ORDER — SODIUM CHLORIDE 0.9% FLUSH
10.0000 mL | INTRAVENOUS | Status: AC | PRN
  Administered 2017-12-26: 10 mL
  Filled 2017-12-26: qty 10

## 2017-12-26 MED ORDER — HEPARIN SOD (PORK) LOCK FLUSH 100 UNIT/ML IV SOLN
500.0000 [IU] | INTRAVENOUS | Status: AC | PRN
  Administered 2017-12-26: 500 [IU]

## 2018-01-05 ENCOUNTER — Ambulatory Visit: Payer: Self-pay | Admitting: Family Medicine

## 2018-01-06 ENCOUNTER — Other Ambulatory Visit: Payer: Self-pay | Admitting: Family Medicine

## 2018-01-22 LAB — COMPREHENSIVE METABOLIC PANEL
ALT: 20 IU/L (ref 0–44)
AST: 18 IU/L (ref 0–40)
Albumin/Globulin Ratio: 1.8 (ref 1.2–2.2)
Albumin: 4.4 g/dL (ref 3.6–4.8)
Alkaline Phosphatase: 71 IU/L (ref 39–117)
BUN/Creatinine Ratio: 14 (ref 10–24)
BUN: 11 mg/dL (ref 8–27)
Bilirubin Total: 0.5 mg/dL (ref 0.0–1.2)
CALCIUM: 9 mg/dL (ref 8.6–10.2)
CHLORIDE: 99 mmol/L (ref 96–106)
CO2: 24 mmol/L (ref 20–29)
Creatinine, Ser: 0.79 mg/dL (ref 0.76–1.27)
GFR, EST AFRICAN AMERICAN: 110 mL/min/{1.73_m2} (ref >59)
GFR, EST NON AFRICAN AMERICAN: 95 mL/min/{1.73_m2} (ref >59)
GLUCOSE: 183 mg/dL — AB (ref 65–99)
Globulin, Total: 2.5 g/dL (ref 1.5–4.5)
Potassium: 3.5 mmol/L (ref 3.5–5.2)
Sodium: 142 mmol/L (ref 134–144)
TOTAL PROTEIN: 6.9 g/dL (ref 6.0–8.5)

## 2018-01-22 LAB — TSH: TSH: 1.15 u[IU]/mL (ref 0.450–4.500)

## 2018-01-22 LAB — CBC WITH DIFFERENTIAL/PLATELET
BASOS ABS: 0 10*3/uL (ref 0.0–0.2)
BASOS: 0 %
EOS (ABSOLUTE): 0.1 10*3/uL (ref 0.0–0.4)
Eos: 1 %
HEMOGLOBIN: 13.9 g/dL (ref 13.0–17.7)
Hematocrit: 40.6 % (ref 37.5–51.0)
Immature Grans (Abs): 0 10*3/uL (ref 0.0–0.1)
Immature Granulocytes: 0 %
LYMPHS ABS: 0.8 10*3/uL (ref 0.7–3.1)
Lymphs: 15 %
MCH: 29.4 pg (ref 26.6–33.0)
MCHC: 34.2 g/dL (ref 31.5–35.7)
MCV: 86 fL (ref 79–97)
MONOCYTES: 8 %
Monocytes Absolute: 0.4 10*3/uL (ref 0.1–0.9)
NEUTROS ABS: 4 10*3/uL (ref 1.4–7.0)
Neutrophils: 76 %
Platelets: 217 10*3/uL (ref 150–379)
RBC: 4.72 x10E6/uL (ref 4.14–5.80)
RDW: 15.2 % (ref 12.3–15.4)
WBC: 5.3 10*3/uL (ref 3.4–10.8)

## 2018-01-22 LAB — LIPID PANEL
CHOL/HDL RATIO: 6.7 ratio — AB (ref 0.0–5.0)
CHOLESTEROL TOTAL: 188 mg/dL (ref 100–199)
HDL: 28 mg/dL — ABNORMAL LOW (ref >39)
Triglycerides: 461 mg/dL — ABNORMAL HIGH (ref 0–149)

## 2018-01-22 LAB — HEMOGLOBIN A1C
Est. average glucose Bld gHb Est-mCnc: 163 mg/dL
Hgb A1c MFr Bld: 7.3 % — ABNORMAL HIGH (ref 4.8–5.6)

## 2018-01-22 LAB — PSA: PROSTATE SPECIFIC AG, SERUM: 1.3 ng/mL (ref 0.0–4.0)

## 2018-01-22 LAB — URIC ACID: Uric Acid: 5.3 mg/dL (ref 3.7–8.6)

## 2018-01-26 ENCOUNTER — Telehealth: Payer: Self-pay

## 2018-01-26 NOTE — Telephone Encounter (Signed)
Pt advised.   Thanks,   -Asaad Gulley  

## 2018-01-26 NOTE — Telephone Encounter (Signed)
-----   Message from [name]., MD sent at 01/22/2018  4:01 PM EDT ----- Roxanne Gates on D and E for glucose/triglycerides.

## 2018-01-29 ENCOUNTER — Ambulatory Visit: Payer: BLUE CROSS/BLUE SHIELD | Admitting: Family Medicine

## 2018-01-29 ENCOUNTER — Encounter: Payer: Self-pay | Admitting: Family Medicine

## 2018-01-29 VITALS — BP 148/72 | HR 86 | Temp 98.6°F | Resp 16 | Wt 297.0 lb

## 2018-01-29 DIAGNOSIS — E118 Type 2 diabetes mellitus with unspecified complications: Secondary | ICD-10-CM | POA: Diagnosis not present

## 2018-01-29 DIAGNOSIS — K7460 Unspecified cirrhosis of liver: Secondary | ICD-10-CM | POA: Diagnosis not present

## 2018-01-29 DIAGNOSIS — Z794 Long term (current) use of insulin: Secondary | ICD-10-CM

## 2018-01-29 DIAGNOSIS — C8333 Diffuse large B-cell lymphoma, intra-abdominal lymph nodes: Secondary | ICD-10-CM | POA: Diagnosis not present

## 2018-01-29 DIAGNOSIS — I10 Essential (primary) hypertension: Secondary | ICD-10-CM

## 2018-01-29 NOTE — Progress Notes (Signed)
Patient: Peter Ryan Male    DOB: August 08, 1951   67 y.o.   MRN: [id number] Visit Date: 01/29/2018  Today's Provider: Wilhemena Durie, MD   Chief Complaint  Patient presents with  . Hypertension   Subjective:    HPI  Hypertension, follow-up:  BP Readings from Last 3 Encounters:  01/29/18 (!) 148/72  12/02/17 (!) 170/90  11/26/17 (!) 164/81    He was last seen for hypertension 2 months ago.  BP at that visit was 170/90. Management since that visit includes increase Losartan to 100 mg daily. He reports good compliance with treatment. He is not having side effects.  He is exercising, so but very little. He is adherent to low salt diet.   Outside blood pressures are not being checked at home. Patient denies chest pain, chest pressure/discomfort, claudication, dyspnea, exertional chest pressure/discomfort, fatigue, irregular heart beat, lower extremity edema, near-syncope, orthopnea, palpitations, paroxysmal nocturnal dyspnea, syncope and tachypnea.    Wt Readings from Last 3 Encounters:  01/29/18 297 lb (134.7 kg)  12/02/17 (!) 302 lb (137 kg)  11/26/17 298 lb (135.2 kg)   ------------------------------------------------------------------------      Allergies  Allergen Reactions  . Metformin Other (See Comments)    Stomach upset      Current Outpatient Medications:  .  allopurinol (ZYLOPRIM) 300 MG tablet, Take 1 tablet (300 mg total) by mouth daily., Disp: 30 tablet, Rfl: 3 .  amLODipine (NORVASC) 10 MG tablet, TAKE 1 TABLET BY MOUTH DAILY, Disp: 30 tablet, Rfl: 11 .  aspirin EC 81 MG tablet, Take 81 mg by mouth daily. , Disp: , Rfl:  .  Cholecalciferol (VITAMIN D3) 1000 UNITS CAPS, Take 1,000 Units by mouth daily. , Disp: , Rfl:  .  finasteride (PROSCAR) 5 MG tablet, Take 1 tablet (5 mg total) by mouth daily., Disp: 90 tablet, Rfl: 3 .  losartan (COZAAR) 100 MG tablet, Take 1 tablet (100 mg total) by mouth daily., Disp: 90 tablet, Rfl: 3 .   allopurinol (ZYLOPRIM) 300 MG tablet, TAKE ONE TABLET BY MOUTH EVERY DAY, Disp: 30 tablet, Rfl: 5 .  allopurinol (ZYLOPRIM) 300 MG tablet, TAKE ONE TABLET BY MOUTH EVERY DAY, Disp: 30 tablet, Rfl: 5  Review of Systems  Constitutional: Negative.   HENT: Negative.   Eyes: Negative.   Respiratory: Negative.   Cardiovascular: Negative.   Gastrointestinal: Negative.   Endocrine: Negative.   Genitourinary: Negative.   Musculoskeletal: Negative.   Skin: Negative.   Allergic/Immunologic: Negative.   Neurological: Negative.   Hematological: Negative.   Psychiatric/Behavioral: Negative.     Social History   Tobacco Use  . Smoking status: Former Smoker    Packs/day: 1.50    Years: 8.00    Pack years: 12.00    Types: Cigarettes  . Smokeless tobacco: Never Used  . Tobacco comment: quit 40 years ago  Substance Use Topics  . Alcohol use: No    Alcohol/week: 0.0 oz    Comment: rarely   Objective:   BP (!) 148/72 (BP Location: Left Arm, Patient Position: Sitting, Cuff Size: Large)   Pulse 86   Temp 98.6 F (37 C) (Oral)   Resp 16   Wt 297 lb (134.7 kg)   BMI 42.62 kg/m  Vitals:   01/29/18 0912  BP: (!) 148/72  Pulse: 86  Resp: 16  Temp: 98.6 F (37 C)  TempSrc: Oral  Weight: 297 lb (134.7 kg)     Physical Exam  Constitutional: He  is oriented to person, place, and time. He appears well-developed and well-nourished.  HENT:  Head: Normocephalic and atraumatic.  Eyes: Conjunctivae are normal. No scleral icterus.  Neck: No thyromegaly present.  Cardiovascular: Normal rate, regular rhythm and normal heart sounds.  Pulmonary/Chest: Effort normal and breath sounds normal.  Abdominal: Soft.  Musculoskeletal: He exhibits no edema.  Trace edema.  Neurological: He is alert and oriented to person, place, and time.  Skin: Skin is warm and dry.  Psychiatric: He has a normal mood and affect. His behavior is normal. Judgment and thought content normal.        Assessment &  Plan:     Diffuse Large B cell Lymphoma HTN Obesity TIIDM Pt ready to work on D and E.  I have done the exam and reviewed the chart and it is accurate to the best of my knowledge. Development worker, community has been used and  any errors in dictation or transcription are unintentional. Miguel Aschoff M.D. [place], MD  [facility]

## 2018-02-06 ENCOUNTER — Encounter: Payer: Self-pay | Admitting: Nurse Practitioner

## 2018-02-06 ENCOUNTER — Inpatient Hospital Stay: Payer: BLUE CROSS/BLUE SHIELD | Attending: Oncology

## 2018-02-06 ENCOUNTER — Inpatient Hospital Stay (HOSPITAL_BASED_OUTPATIENT_CLINIC_OR_DEPARTMENT_OTHER): Payer: BLUE CROSS/BLUE SHIELD | Admitting: Nurse Practitioner

## 2018-02-06 VITALS — BP 172/91 | HR 85 | Temp 98.3°F | Resp 18 | Ht 70.0 in | Wt 297.7 lb

## 2018-02-06 DIAGNOSIS — K760 Fatty (change of) liver, not elsewhere classified: Secondary | ICD-10-CM

## 2018-02-06 DIAGNOSIS — R188 Other ascites: Secondary | ICD-10-CM | POA: Insufficient documentation

## 2018-02-06 DIAGNOSIS — C8333 Diffuse large B-cell lymphoma, intra-abdominal lymph nodes: Secondary | ICD-10-CM | POA: Diagnosis not present

## 2018-02-06 DIAGNOSIS — Z7982 Long term (current) use of aspirin: Secondary | ICD-10-CM

## 2018-02-06 DIAGNOSIS — N401 Enlarged prostate with lower urinary tract symptoms: Secondary | ICD-10-CM | POA: Diagnosis not present

## 2018-02-06 DIAGNOSIS — R350 Frequency of micturition: Secondary | ICD-10-CM | POA: Diagnosis not present

## 2018-02-06 DIAGNOSIS — I10 Essential (primary) hypertension: Secondary | ICD-10-CM | POA: Diagnosis not present

## 2018-02-06 DIAGNOSIS — Z9221 Personal history of antineoplastic chemotherapy: Secondary | ICD-10-CM

## 2018-02-06 DIAGNOSIS — E785 Hyperlipidemia, unspecified: Secondary | ICD-10-CM | POA: Insufficient documentation

## 2018-02-06 DIAGNOSIS — M1990 Unspecified osteoarthritis, unspecified site: Secondary | ICD-10-CM | POA: Diagnosis not present

## 2018-02-06 DIAGNOSIS — Z79899 Other long term (current) drug therapy: Secondary | ICD-10-CM

## 2018-02-06 DIAGNOSIS — Z87891 Personal history of nicotine dependence: Secondary | ICD-10-CM | POA: Insufficient documentation

## 2018-02-06 DIAGNOSIS — Z452 Encounter for adjustment and management of vascular access device: Secondary | ICD-10-CM | POA: Diagnosis not present

## 2018-02-06 DIAGNOSIS — K219 Gastro-esophageal reflux disease without esophagitis: Secondary | ICD-10-CM | POA: Insufficient documentation

## 2018-02-06 DIAGNOSIS — K7460 Unspecified cirrhosis of liver: Secondary | ICD-10-CM | POA: Insufficient documentation

## 2018-02-06 LAB — CBC WITH DIFFERENTIAL/PLATELET
Basophils Absolute: 0.1 10*3/uL (ref 0–0.1)
Basophils Relative: 1 %
EOS ABS: 0.1 10*3/uL (ref 0–0.7)
EOS PCT: 2 %
HCT: 37.9 % — ABNORMAL LOW (ref 40.0–52.0)
Hemoglobin: 13.4 g/dL (ref 13.0–18.0)
LYMPHS ABS: 0.8 10*3/uL — AB (ref 1.0–3.6)
LYMPHS PCT: 12 %
MCH: 29.9 pg (ref 26.0–34.0)
MCHC: 35.4 g/dL (ref 32.0–36.0)
MCV: 84.5 fL (ref 80.0–100.0)
MONO ABS: 0.7 10*3/uL (ref 0.2–1.0)
MONOS PCT: 10 %
Neutro Abs: 5.4 10*3/uL (ref 1.4–6.5)
Neutrophils Relative %: 75 %
PLATELETS: 224 10*3/uL (ref 150–440)
RBC: 4.48 MIL/uL (ref 4.40–5.90)
RDW: 14.5 % (ref 11.5–14.5)
WBC: 7.1 10*3/uL (ref 3.8–10.6)

## 2018-02-06 LAB — COMPREHENSIVE METABOLIC PANEL
ALBUMIN: 3.9 g/dL (ref 3.5–5.0)
ALK PHOS: 71 U/L (ref 38–126)
ALT: 17 U/L (ref 17–63)
ANION GAP: 12 (ref 5–15)
AST: 24 U/L (ref 15–41)
BILIRUBIN TOTAL: 0.5 mg/dL (ref 0.3–1.2)
BUN: 13 mg/dL (ref 6–20)
CO2: 24 mmol/L (ref 22–32)
CREATININE: 0.91 mg/dL (ref 0.61–1.24)
Calcium: 8.9 mg/dL (ref 8.9–10.3)
Chloride: 102 mmol/L (ref 101–111)
GFR calc Af Amer: >60 mL/min (ref >60)
GFR calc non Af Amer: >60 mL/min (ref >60)
GLUCOSE: 187 mg/dL — AB (ref 65–99)
Potassium: 3.4 mmol/L — ABNORMAL LOW (ref 3.5–5.1)
SODIUM: 138 mmol/L (ref 135–145)
Total Protein: 7.3 g/dL (ref 6.5–8.1)

## 2018-02-06 LAB — LACTATE DEHYDROGENASE: LDH: 114 U/L (ref 98–192)

## 2018-02-06 MED ORDER — HEPARIN SOD (PORK) LOCK FLUSH 100 UNIT/ML IV SOLN
500.0000 [IU] | Freq: Once | INTRAVENOUS | Status: AC
  Administered 2018-02-06: 500 [IU] via INTRAVENOUS

## 2018-02-06 MED ORDER — SODIUM CHLORIDE 0.9% FLUSH
10.0000 mL | Freq: Once | INTRAVENOUS | Status: AC
  Administered 2018-02-06: 10 mL via INTRAVENOUS
  Filled 2018-02-06: qty 10

## 2018-02-06 NOTE — Progress Notes (Signed)
Hematology/Oncology Consult note [facility]  [phone] [phone]  Patient Care Team: [name]., MD as PCP - General (Family Medicine) [name], MD as Consulting Physician (Pulmonary Disease) Sindy Guadeloupe, MD as Consulting Physician (Oncology) Verlon Au, NP as Nurse Practitioner (Nurse Practitioner) Leona Singleton, RN as Oncology Nurse Navigator   Name of the patient: Peter Ryan  [id number]  May 06, 1951   Date of visit: 02/06/18  Diagnosis- Stage IV DLBCL GCB IPI score- low intermediate risk   Chief complaint/ Reason for visit- 3 mo follow-up  Heme/Onc history:  Patient initially presented with symptoms of worsening shortness of breath and was found to have right pleural effusion on chest x-ray.  CT chest with contrast on 11/12/2016 revealed multiple lymph nodes throughout the mediastinum measuring approximately 1 cm in short axis.  Upper abdomen showed evidence of ascites and mild cirrhotic changes to the liver.  Patient underwent thoracentesis for recurrent right pleural effusion.  Fluid was exudative in nature and LDH in the pleural fluid was elevated.  Cytology was negative for malignancy.  Patient also had peripheral flow cytometry done which was negative for malignancy.  Pleural and peritoneal fluid cytology has been negative for malignancy/lymphoma.  2.  Ultrasound of abdomen on 01/24/2017 showed sludge in the gallbladder and parenchymal of the liver suggesting cirrhosis as well as moderate ascites.  Initially his ascites was attributed to his cirrhosis from nonalcoholic fatty liver disease.  He was also treated for culture-negative neutrophilic SBP based on the cystic fluid analysis.  Given that the ascites fluid from nonalcoholic fatty liver disease as well as no evidence of splenomegaly or abnormal LFTs.  CT abdomen was obtained to rule out any other etiology for ascites.  3.  CT abdomen on 03/21/2017  showed: 1.  Bulky central mesenteric mass lesion encasing major mesenteric vascular anatomy without substantial mass-effect.  Diffuse omental caking is evidence of peritoneal enhancement is identified.  There is associated mesenteric, check stool diaphragmatic, retroperitoneal, and retrocrural lymphadenopathy.  Lymphoma should be consideration.  Metastatic disease could also have this appearance.  4.  2.  Small to moderate volume ascites  4.  PET/CT showed: 1. Large hypermetabolic left eccentric mesenteric mass with extensive omental caking and peritoneal spread of tumor. This mechanism of spread would seem to favor a gastrointestinal primary, with entities such as carcinoid tumor or lymphoma as differential diagnostic considerations. Tissue diagnosis recommended. 2. Hypermetabolic mediastinal adenopathy compatible with malignancy. Moderate to large bilateral pleural effusions with passive atelectasis and low-grade activity along the pleural fluid-early malignant pleural effusions not excluded. 3. Aortic Atherosclerosis (ICD10-I70.0). 4. No definite involvement of the neck or skeleton. 5. Moderately enlarged prostate gland.  5.  Omental biopsy showed: DIAGNOSIS:  A. SOFT TISSUE MASS, LEFT MESENTERY; CT-GUIDED CORE BIOPSY:  - LARGE B-CELL LYMPHOMA, CD10 POSITIVE  FISH testing for BCL 6 and cmycwas negative. Therefore he does not have a double hit lymphoma. Bone marrow biopsy was negative for lymphoma. Normal LDH. He did not meet criteria for cns prophylaxis. His only risk factors being age (67) and stage IV  6. Initiated R CHOP chemotherapy on 04/17/2017.   7.  Interim PET/CT after 3 cycles showed excellent response to treatment  8. Repeat PET/CT after 6 cycles of R CHOP showed: Grossly similar size of small bowel mesenteric mass, somewhat difficult to measure secondary to its morphology. Slight increase in mild to moderate hypermetabolism. 2. No evidence of extra-abdominal hypermetabolic lymphoma. 3.  Decrease in small  right pleural effusion.  9. Repeat PET/CT on 11/17/17 showed: 1. Known left mesenteric mass is not well visualized on the noncontrast CT images, but demonstrates no metabolic activity above background. This is consistent with treated lymphoma. 2. No new areas of hypermetabolic activity. 3. Resolved right pleural effusion. Additional incidental findings are stable, including hepatic steatosis and atherosclerosis.  Interval history-patient returns to clinic today for follow-up.  He reports significant stress at work recently.  He continues to report weakness and "tired" feeling in his legs since chemotherapy.  Also complains of fatigue. Denies numbness or tingling.  Denies shortness of breath.  He has not been exercising or eating healthy.  Denies drenching night sweats or fevers.  He denies abdominal pain.  ECOG PS- 0 Pain scale- 0  Review of systems- Review of Systems  Constitutional: Negative for chills, fever, malaise/fatigue and weight loss.       Accompanied  HENT: Negative for congestion, ear discharge and nosebleeds.   Eyes: Negative for blurred vision.  Respiratory: Negative for cough, hemoptysis, sputum production, shortness of breath and wheezing.   Cardiovascular: Negative for chest pain, palpitations, orthopnea and claudication.  Gastrointestinal: Negative for abdominal pain, blood in stool, constipation, diarrhea, heartburn, melena, nausea and vomiting.  Genitourinary: Negative for dysuria, flank pain, frequency, hematuria and urgency.  Musculoskeletal: Negative for back pain, joint pain and myalgias.       'tired' legs  Skin: Negative for rash.  Neurological: Negative for dizziness, tingling, focal weakness, seizures, weakness and headaches.  Endo/Heme/Allergies: Does not bruise/bleed easily.  Psychiatric/Behavioral: Negative for depression and suicidal ideas. The patient does not have insomnia.        Stress     Allergies  Allergen Reactions  . Metformin  Other (See Comments)    Stomach upset     Past Medical History:  Diagnosis Date  . Arthritis   . BPH (benign prostatic hyperplasia)   . Cancer ([place])   . Chronic prostatitis   . Cirrhosis of liver ([place])   . Dyslipidemia   . Elevated PSA   . Gastric reflux   . Gout   . HTN (hypertension)   . Over weight   . Psoriasis   . Testicular pain, right   . Urinary frequency     Past Surgical History:  Procedure Laterality Date  . hydrocelectomy    . IR FLUORO GUIDE PORT INSERTION RIGHT  04/11/2017  . PILONIDAL CYST EXCISION      Social History   Socioeconomic History  . Marital status: Married    Spouse name: Not on file  . Number of children: Not on file  . Years of education: Not on file  . Highest education level: Not on file  Occupational History  . Not on file  Social Needs  . Financial resource strain: Not on file  . Food insecurity:    Worry: Not on file    Inability: Not on file  . Transportation needs:    Medical: Not on file    Non-medical: Not on file  Tobacco Use  . Smoking status: Former Smoker    Packs/day: 1.50    Years: 8.00    Pack years: 12.00    Types: Cigarettes  . Smokeless tobacco: Never Used  . Tobacco comment: quit 40 years ago  Substance and Sexual Activity  . Alcohol use: No    Alcohol/week: 0.0 oz    Comment: rarely  . Drug use: No  . Sexual activity: Not on file  Lifestyle  .  Physical activity:    Days per week: Not on file    Minutes per session: Not on file  . Stress: Not on file  Relationships  . Social connections:    Talks on phone: Not on file    Gets together: Not on file    Attends religious service: Not on file    Active member of club or organization: Not on file    Attends meetings of clubs or organizations: Not on file    Relationship status: Not on file  . Intimate partner violence:    Fear of current or ex partner: Not on file    Emotionally abused: Not on file    Physically abused: Not on file    Forced  sexual activity: Not on file  Other Topics Concern  . Not on file  Social History Narrative  . Not on file    Family History  Problem Relation Age of Onset  . Uterine cancer Mother   . Diabetes Mother   . Cancer Mother   . Cancer Maternal Uncle   . Bladder Cancer Neg Hx   . Kidney disease Neg Hx   . Prostate cancer Neg Hx      Current Outpatient Medications:  .  allopurinol (ZYLOPRIM) 300 MG tablet, Take 1 tablet (300 mg total) by mouth daily., Disp: 30 tablet, Rfl: 3 .  amLODipine (NORVASC) 10 MG tablet, TAKE 1 TABLET BY MOUTH DAILY, Disp: 30 tablet, Rfl: 11 .  aspirin EC 81 MG tablet, Take 81 mg by mouth daily. , Disp: , Rfl:  .  Cholecalciferol (VITAMIN D3) 1000 UNITS CAPS, Take 1,000 Units by mouth daily. , Disp: , Rfl:  .  finasteride (PROSCAR) 5 MG tablet, Take 1 tablet (5 mg total) by mouth daily., Disp: 90 tablet, Rfl: 3 .  losartan (COZAAR) 100 MG tablet, Take 1 tablet (100 mg total) by mouth daily., Disp: 90 tablet, Rfl: 3 .  allopurinol (ZYLOPRIM) 300 MG tablet, TAKE ONE TABLET BY MOUTH EVERY DAY, Disp: 30 tablet, Rfl: 5 .  allopurinol (ZYLOPRIM) 300 MG tablet, TAKE ONE TABLET BY MOUTH EVERY DAY, Disp: 30 tablet, Rfl: 5  Physical exam:  Vitals:   02/06/18 1445  BP: (!) 172/91  Pulse: 85  Resp: 18  Temp: 98.3 F (36.8 C)  TempSrc: Tympanic  SpO2: 97%  Weight: 297 lb 11.2 oz (135 kg)  Height: '5\' 10"'$  (1.778 m)   Physical Exam  Constitutional: He is oriented to person, place, and time and well-developed, well-nourished, and in no distress.  Accompanied  HENT:  Head: Normocephalic and atraumatic.  Eyes: Pupils are equal, round, and reactive to light. EOM are normal.  Neck: Normal range of motion.  Cardiovascular: Normal rate, regular rhythm and normal heart sounds.  Pulmonary/Chest: Effort normal and breath sounds normal.  Abdominal: Soft. Bowel sounds are normal.  Morbid obesity  Musculoskeletal: He exhibits no edema or deformity.  Strength intact BLE 5/5    Neurological: He is alert and oriented to person, place, and time.  Skin: Skin is warm and dry.  Psychiatric: Affect normal.     CMP Latest Ref Rng & Units 02/06/2018  Glucose 65 - 99 mg/dL 187(H)  BUN 6 - 20 mg/dL 13  Creatinine 0.61 - 1.24 mg/dL 0.91  Sodium 135 - 145 mmol/L 138  Potassium 3.5 - 5.1 mmol/L 3.4(L)  Chloride 101 - 111 mmol/L 102  CO2 22 - 32 mmol/L 24  Calcium 8.9 - 10.3 mg/dL 8.9  Total Protein 6.5 -  8.1 g/dL 7.3  Total Bilirubin 0.3 - 1.2 mg/dL 0.5  Alkaline Phos 38 - 126 U/L 71  AST 15 - 41 U/L 24  ALT 17 - 63 U/L 17   CBC Latest Ref Rng & Units 02/06/2018  WBC 3.8 - 10.6 K/uL 7.1  Hemoglobin 13.0 - 18.0 g/dL 13.4  Hematocrit 40.0 - 52.0 % 37.9(L)  Platelets 150 - 440 K/uL 224   No images are attached to the encounter.  No results found.   Assessment and plan- Patient is a 67 y.o. male with history of stage IV DLBCL GCB who returns to clinic for 38-monthfollow-up.   Stage IV DLBCL GCB, not double hit, low intermediate risk IPI, s/p 6 cycles of R-CHOP chemotherapy ( h/o Stage IV DLBCL GCB, not double hit, low intermediaterisk IPI status post 6 cycles of R CHOP chemotherapy (completed 08/01/2017).  Overall patient had excellent response to chemotherapy and he is in CR 1 at this time.  He does have residual mesenteric mass with no appreciable hypermetabolism.  He is currently in remission from his lymphoma.  Although NCCN guidelines typically do not recommend surveillance scans in DLBCL, he had a significant burden of disease and was stage IV at time of presentation.  Therefore, we will repeat PET/CT in 3 months.  We again discussed his high BMI/obesity.  I again encouraged him to focus on portion control and avoiding foods high in carbohydrates and sugars.  I stressed that weight loss is significantly important for him given his history of fatty liver disease and cirrhosis. May consider f/u with GI in future for further evaluation. He reports significant stress  with work which may be exacerbating his unhealthy eating habits.  We discussed stress management strategies.  I suspect that his lower extremity weakness may be related to a sedentary lifestyle and general deconditioning.  I encouraged weight loss and activity as tolerated.  Wife reports balance issues since completing chemotherapy.  Could consider referral to PT/OT in future. Patient declines at this time d/t insurance. He is unable to participate in CARE program d/t work schedule.   PET/CT in 3 months and return to clinic few days later for labs (CBC, CMP, LDH) and re-evaluation with Dr. RJanese Banks   His port was flushed today. Will schedule for him to have his port flushed again in 6-8 weeks.     Visit Diagnosis 1. 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 ([place]   2. Obesity, morbid (more than 100 lbs over ideal weight or BMI > 40) ([place]      LBeckey Rutter DNP, AGNP-C [place](work cell) [phone](office) 02/06/18 6:41 PM

## 2018-02-06 NOTE — Progress Notes (Signed)
No new changes noted today  Repeat b/p 152/72 p: 80

## 2018-02-06 NOTE — Patient Instructions (Signed)
Mindfulness-Based Stress Reduction  Mindfulness-based stress reduction (MBSR) is a program that helps people learn to practice mindfulness. Mindfulness is the practice of intentionally paying attention to the present moment. It can be learned and practiced through techniques such as education, breathing exercises, meditation, and yoga. MBSR includes several mindfulness techniques in one program.  MBSR works best when you understand the treatment, are willing to try new things, and can commit to spending time practicing what you learn. MBSR training may include learning about:  · How your emotions, thoughts, and reactions affect your body.  · New ways to respond to things that cause negative thoughts to start (triggers).  · How to notice your thoughts and let go of them.  · Practicing awareness of everyday things that you normally do without thinking.  · The techniques and goals of different types of meditation.    What are the benefits of MBSR?  MBSR can have many benefits, which include helping you to:  · Develop self-awareness. This refers to knowing and understanding yourself.  · Learn skills and attitudes that help you to participate in your own health care.  · Learn new ways to care for yourself.  · Be more accepting about how things are, and let things go.  · Be less judgmental and approach things with an open mind.  · Be patient with yourself and trust yourself more.    MBSR has also been shown to:  · Reduce negative emotions, such as depression and anxiety.  · Improve memory and focus.  · Change how you sense and approach pain.  · Boost your body's ability to fight infections.  · Help you connect better with other people.  · Improve your sense of well-being.    Follow these instructions at home:  · Find a local in-person or online MBSR program.  · Set aside some time regularly for mindfulness practice.  · Find a mindfulness practice that works best for you. This may include one or more of the  following:  ? Meditation. Meditation involves focusing your mind on a certain thought or activity.  ? Breathing awareness exercises. These help you to stay present by focusing on your breath.  ? Body scan. For this practice, you lie down and pay attention to each part of your body from head to toe. You can identify tension and soreness and intentionally relax parts of your body.  ? Yoga. Yoga involves stretching and breathing, and it can improve your ability to move and be flexible. It can also provide an experience of testing your body's limits, which can help you release stress.  ? Mindful eating. This way of eating involves focusing on the taste, texture, color, and smell of each bite of food. Because this slows down eating and helps you feel full sooner, it can be an important part of a weight-loss plan.  · Find a podcast or recording that provides guidance for breathing awareness, body scan, or meditation exercises. You can listen to these any time when you have a free moment to rest without distractions.  · Follow your treatment plan as told by your health care provider. This may include taking regular medicines and making changes to your diet or lifestyle as recommended.  How to practice mindfulness  To do a basic awareness exercise:  · Find a comfortable place to sit.  · Pay attention to the present moment. Observe your thoughts, feelings, and surroundings just as they are.  · Avoid placing judgment on yourself,   your feelings, or your surroundings. Make note of any judgment that comes up, and let it go.  · Your mind may wander, and that is okay. Make note of when your thoughts drift, and return your attention to the present moment.    To do basic mindfulness meditation:  · Find a comfortable place to sit. This may include a stable chair or a firm floor cushion.  ? Sit upright with your back straight. Let your arms fall next to your side with your hands resting on your legs.  ? If sitting in a chair, rest  your feet flat on the floor.  ? If sitting on a cushion, cross your legs in front of you.  · Keep your head in a neutral position with your chin dropped slightly. Relax your jaw and rest the tip of your tongue on the roof of your mouth. Drop your gaze to the floor. You can close your eyes if you like.  · Breathe normally and pay attention to your breath. Feel the air moving in and out of your nose. Feel your belly expanding and relaxing with each breath.  · Your mind may wander, and that is okay. Make note of when your thoughts drift, and return your attention to your breath.  · Avoid placing judgment on yourself, your feelings, or your surroundings. Make note of any judgment or feelings that come up, let them go, and bring your attention back to your breath.  · When you are ready, lift your gaze or open your eyes. Pay attention to how your body feels after the meditation.    Where to find more information:  You can find more information about MBSR from:  · Your health care provider.  · Community-based meditation centers or programs.  · Programs offered near you.    Summary  · Mindfulness-based stress reduction (MBSR) is a program that teaches you how to intentionally pay attention to the present moment. It is used with other treatments to help you cope better with daily stress, emotions, and pain.  · MBSR focuses on developing self-awareness, which allows you to respond to life stress without judgment or negative emotions.  · MBSR programs may involve learning different mindfulness practices, such as breathing exercises, meditation, yoga, body scan, or mindful eating. Find a mindfulness practice that works best for you, and set aside time for it on a regular basis.  This information is not intended to replace advice given to you by your health care provider. Make sure you discuss any questions you have with your health care provider.  Document Released: 01/23/2017 Document Revised: 01/23/2017 Document Reviewed:  01/23/2017  Elsevier Interactive Patient Education © 2018 [employer]

## 2018-04-03 ENCOUNTER — Inpatient Hospital Stay: Payer: Medicare Other | Attending: Oncology

## 2018-04-03 DIAGNOSIS — Z9221 Personal history of antineoplastic chemotherapy: Secondary | ICD-10-CM | POA: Diagnosis not present

## 2018-04-03 DIAGNOSIS — Z452 Encounter for adjustment and management of vascular access device: Secondary | ICD-10-CM | POA: Insufficient documentation

## 2018-04-03 DIAGNOSIS — Z95828 Presence of other vascular implants and grafts: Secondary | ICD-10-CM

## 2018-04-03 DIAGNOSIS — C8333 Diffuse large B-cell lymphoma, intra-abdominal lymph nodes: Secondary | ICD-10-CM | POA: Insufficient documentation

## 2018-04-03 MED ORDER — SODIUM CHLORIDE 0.9% FLUSH
10.0000 mL | Freq: Once | INTRAVENOUS | Status: AC
  Administered 2018-04-03: 10 mL via INTRAVENOUS
  Filled 2018-04-03: qty 10

## 2018-04-03 MED ORDER — HEPARIN SOD (PORK) LOCK FLUSH 100 UNIT/ML IV SOLN
500.0000 [IU] | Freq: Once | INTRAVENOUS | Status: AC
  Administered 2018-04-03: 500 [IU] via INTRAVENOUS

## 2018-04-30 ENCOUNTER — Ambulatory Visit: Payer: Self-pay | Admitting: Family Medicine

## 2018-05-08 ENCOUNTER — Encounter
Admission: RE | Admit: 2018-05-08 | Discharge: 2018-05-08 | Disposition: A | Discharge status: Home / Self Care | Payer: Medicare Other | Source: Ambulatory Visit | Attending: Nurse Practitioner | Admitting: Nurse Practitioner

## 2018-05-08 DIAGNOSIS — C8333 Diffuse large B-cell lymphoma, intra-abdominal lymph nodes: Secondary | ICD-10-CM | POA: Diagnosis not present

## 2018-05-08 DIAGNOSIS — C8330 Diffuse large B-cell lymphoma, unspecified site: Secondary | ICD-10-CM | POA: Diagnosis not present

## 2018-05-08 LAB — GLUCOSE, CAPILLARY: GLUCOSE-CAPILLARY: 174 mg/dL — AB (ref 70–99)

## 2018-05-08 MED ORDER — FLUDEOXYGLUCOSE F - 18 (FDG) INJECTION
15.4000 | Freq: Once | INTRAVENOUS | Status: AC
  Administered 2018-05-08: 15.57 via INTRAVENOUS

## 2018-05-12 ENCOUNTER — Inpatient Hospital Stay: Payer: Medicare Other

## 2018-05-12 ENCOUNTER — Other Ambulatory Visit: Payer: Self-pay

## 2018-05-12 ENCOUNTER — Inpatient Hospital Stay: Payer: Medicare Other | Attending: Oncology | Admitting: Oncology

## 2018-05-12 ENCOUNTER — Encounter: Payer: Self-pay | Admitting: Oncology

## 2018-05-12 VITALS — BP 165/81 | HR 93 | Temp 98.5°F | Resp 18 | Ht 70.0 in | Wt 293.9 lb

## 2018-05-12 DIAGNOSIS — Z9221 Personal history of antineoplastic chemotherapy: Secondary | ICD-10-CM

## 2018-05-12 DIAGNOSIS — Z08 Encounter for follow-up examination after completed treatment for malignant neoplasm: Secondary | ICD-10-CM

## 2018-05-12 DIAGNOSIS — Z79899 Other long term (current) drug therapy: Secondary | ICD-10-CM

## 2018-05-12 DIAGNOSIS — I10 Essential (primary) hypertension: Secondary | ICD-10-CM | POA: Insufficient documentation

## 2018-05-12 DIAGNOSIS — C8333 Diffuse large B-cell lymphoma, intra-abdominal lymph nodes: Secondary | ICD-10-CM

## 2018-05-12 DIAGNOSIS — K219 Gastro-esophageal reflux disease without esophagitis: Secondary | ICD-10-CM | POA: Diagnosis not present

## 2018-05-12 DIAGNOSIS — I700 Atherosclerosis of aorta: Secondary | ICD-10-CM | POA: Insufficient documentation

## 2018-05-12 DIAGNOSIS — K760 Fatty (change of) liver, not elsewhere classified: Secondary | ICD-10-CM | POA: Diagnosis not present

## 2018-05-12 DIAGNOSIS — Z87891 Personal history of nicotine dependence: Secondary | ICD-10-CM | POA: Diagnosis not present

## 2018-05-12 DIAGNOSIS — Z7982 Long term (current) use of aspirin: Secondary | ICD-10-CM | POA: Diagnosis not present

## 2018-05-12 DIAGNOSIS — Z8579 Personal history of other malignant neoplasms of lymphoid, hematopoietic and related tissues: Secondary | ICD-10-CM

## 2018-05-12 DIAGNOSIS — Z95828 Presence of other vascular implants and grafts: Secondary | ICD-10-CM

## 2018-05-12 DIAGNOSIS — M1990 Unspecified osteoarthritis, unspecified site: Secondary | ICD-10-CM | POA: Diagnosis not present

## 2018-05-12 DIAGNOSIS — E785 Hyperlipidemia, unspecified: Secondary | ICD-10-CM | POA: Diagnosis not present

## 2018-05-12 DIAGNOSIS — K7460 Unspecified cirrhosis of liver: Secondary | ICD-10-CM | POA: Insufficient documentation

## 2018-05-12 LAB — COMPREHENSIVE METABOLIC PANEL
ALT: 20 U/L (ref 0–44)
AST: 24 U/L (ref 15–41)
Albumin: 4.1 g/dL (ref 3.5–5.0)
Alkaline Phosphatase: 61 U/L (ref 38–126)
Anion gap: 11 (ref 5–15)
BILIRUBIN TOTAL: 0.5 mg/dL (ref 0.3–1.2)
BUN: 14 mg/dL (ref 8–23)
CHLORIDE: 105 mmol/L (ref 98–111)
CO2: 23 mmol/L (ref 22–32)
CREATININE: 0.83 mg/dL (ref 0.61–1.24)
Calcium: 9 mg/dL (ref 8.9–10.3)
GFR calc Af Amer: >60 mL/min (ref >60)
Glucose, Bld: 207 mg/dL — ABNORMAL HIGH (ref 70–99)
Potassium: 3.3 mmol/L — ABNORMAL LOW (ref 3.5–5.1)
Sodium: 139 mmol/L (ref 135–145)
Total Protein: 7.4 g/dL (ref 6.5–8.1)

## 2018-05-12 LAB — CBC WITH DIFFERENTIAL/PLATELET
Basophils Absolute: 0.1 10*3/uL (ref 0–0.1)
Basophils Relative: 1 %
EOS ABS: 0.1 10*3/uL (ref 0–0.7)
EOS PCT: 2 %
HCT: 40.2 % (ref 40.0–52.0)
Hemoglobin: 14.1 g/dL (ref 13.0–18.0)
LYMPHS ABS: 1.1 10*3/uL (ref 1.0–3.6)
Lymphocytes Relative: 15 %
MCH: 30 pg (ref 26.0–34.0)
MCHC: 35.1 g/dL (ref 32.0–36.0)
MCV: 85.5 fL (ref 80.0–100.0)
Monocytes Absolute: 0.5 10*3/uL (ref 0.2–1.0)
Monocytes Relative: 8 %
Neutro Abs: 5.2 10*3/uL (ref 1.4–6.5)
Neutrophils Relative %: 74 %
Platelets: 233 10*3/uL (ref 150–440)
RBC: 4.7 MIL/uL (ref 4.40–5.90)
RDW: 14.2 % (ref 11.5–14.5)
WBC: 7 10*3/uL (ref 3.8–10.6)

## 2018-05-12 LAB — LACTATE DEHYDROGENASE: LDH: 113 U/L (ref 98–192)

## 2018-05-12 MED ORDER — HEPARIN SOD (PORK) LOCK FLUSH 100 UNIT/ML IV SOLN
500.0000 [IU] | Freq: Once | INTRAVENOUS | Status: AC
  Administered 2018-05-12: 500 [IU] via INTRAVENOUS

## 2018-05-12 MED ORDER — SODIUM CHLORIDE 0.9% FLUSH
10.0000 mL | INTRAVENOUS | Status: AC | PRN
  Administered 2018-05-12: 10 mL via INTRAVENOUS
  Filled 2018-05-12: qty 10

## 2018-05-12 NOTE — Progress Notes (Signed)
No new changes noted today 

## 2018-05-13 ENCOUNTER — Other Ambulatory Visit: Payer: Self-pay | Admitting: *Deleted

## 2018-05-13 DIAGNOSIS — C8333 Diffuse large B-cell lymphoma, intra-abdominal lymph nodes: Secondary | ICD-10-CM

## 2018-05-14 NOTE — Progress Notes (Signed)
Hematology/Oncology Consult note [facility]  Telephone:([phone] [phone]  Patient Care Team: [name]., MD as PCP - General (Family Medicine) [name], MD as Consulting Physician (Pulmonary Disease) Sindy Guadeloupe, MD as Consulting Physician (Oncology) Verlon Au, NP as Nurse Practitioner (Nurse Practitioner) Leona Singleton, RN as Oncology Nurse Navigator   Name of the patient: Peter Ryan  [id number]  11-12-1950   Date of visit: 05/14/18  Diagnosis- Stage IV DLBCL GCB IPI score- low intermediate risk  Chief complaint/ Reason for visit-discuss surveillance PET/CT results  Heme/Onc history: 1. Patient is a 67 year old male who initially presented with symptoms of worsening shortness of breathand was found to have right pleural effusion on chest x-ray. CT chest with contrast on 11/12/2016 revealed multiple lymph nodes throughout the mediastinum measuring approximately 1 cm in short axis. Upper abdomen showed evidence of ascites and mild cirrhotic changes in the liver. Patient underwent thoracocentesis for recurrent right pleural effusion. Fluid was exudative in nature and LDH in the pleural fluid was elevated. Cytology was negative for malignancy. Patient also had peripheral flow cytometry done which was negative for malignancy. Pleural and peritoneal fluid cytology has been negative for malignancy/ lymphoma  2. Ultrasound of the abdomen on 01/24/2017 showed sludge in the gallbladder and parenchymal of the liver suggesting cirrhosis as well as moderate ascites. Initially his ascites with attribute it to his cirrhosis and nonalcoholic fatty liver disease. He was also treated for culture negative neutrophilic SBP based on asciticfluid analysis. Given that the ascites fluid was exudative in nature which is not consistent with cirrhosis from nonalcoholic fatty liver disease as well as no evidence of splenomegaly or abnormal LFTs,  CT abdomen was obtained to rule out any other etiology for ascites  3. CT abdomen on 03/21/2017 showed: IMPRESSION: 1. Bulky central mesenteric mass lesion encasing major mesenteric vascular anatomy without substantial mass-effect. Diffuse omental caking is evident and peritoneal enhancement is identified. There is associated mesenteric, juxta diaphragmatic, retroperitoneal, and retrocrural lymphadenopathy. Lymphoma would be a consideration. Metastatic disease could also have this appearance. 2. Small a moderate volume ascites  4. Patient denies any unintentional weight loss. His appetite is fair denies any drenching night sweats or recurrent fevers. Does report fatigue  5. PET CT showed: IMPRESSION: 1. Large hypermetabolic left eccentric mesenteric mass with extensive omental caking and peritoneal spread of tumor. This mechanism of spread would seem to favor a gastrointestinal primary, with entities such as carcinoid tumor or lymphoma as differential diagnostic considerations. Tissue diagnosis recommended. 2. Hypermetabolic mediastinal adenopathy compatible with malignancy. Moderate to large bilateral pleural effusions with passive atelectasis and low-grade activity along the pleural fluid-early malignant pleural effusions not excluded. 3. Aortic Atherosclerosis (ICD10-I70.0). 4. No definite involvement of the neck or skeleton. 5. Moderately enlarged prostate gland.  6. Omental biopsy showed: DIAGNOSIS:  A. SOFT TISSUE MASS, LEFT MESENTERY; CT-GUIDED CORE BIOPSY:  - LARGE B-CELL LYMPHOMA, CD10 POSITIVE  FISH testing for BCL 6 and cmycwas negative. Therefore he does not have a double hit lymphoma. Bone marrow biopsy was negative for lymphoma. Normal LDH. He did not meet criteria for cns prophylaxis. His only risk factors being age and stage IV  Interim PET/CT after 3 cycles should excellent response to treatment  Repeat PET/CT scan after 6 cycles of R CHOP showed:Grossly  similar size of small bowel mesenteric mass, somewhat difficult to measure secondary to its morphology. Slight increase in mild to moderate hypermetabolism. 2. No evidence of  extr abdominal hypermetabolic lymphoma. 3. Decrease in small right pleural effusion.    Interval history-overall is doing well.  His appetite is good and he denies any fatigue or unintentional weight loss.  In fact he has been struggling to lose weight.  Reports problems with his balance at times especially when he walks on uneven surfaces.  ECOG PS- 0 Pain scale- 0 Opioid associated constipation- no  Review of systems- Review of Systems  Constitutional: Negative for chills, fever, malaise/fatigue and weight loss.  HENT: Negative for congestion, ear discharge and nosebleeds.   Eyes: Negative for blurred vision.  Respiratory: Negative for cough, hemoptysis, sputum production, shortness of breath and wheezing.   Cardiovascular: Negative for chest pain, palpitations, orthopnea and claudication.  Gastrointestinal: Negative for abdominal pain, blood in stool, constipation, diarrhea, heartburn, melena, nausea and vomiting.  Genitourinary: Negative for dysuria, flank pain, frequency, hematuria and urgency.  Musculoskeletal: Negative for back pain, joint pain and myalgias.  Skin: Negative for rash.  Neurological: Negative for dizziness, tingling, focal weakness, seizures, weakness and headaches.  Endo/Heme/Allergies: Does not bruise/bleed easily.  Psychiatric/Behavioral: Negative for depression and suicidal ideas. The patient does not have insomnia.        Allergies  Allergen Reactions  . Metformin Other (See Comments)    Stomach upset      Past Medical History:  Diagnosis Date  . Arthritis   . BPH (benign prostatic hyperplasia)   . Cancer ([place])   . Chronic prostatitis   . Cirrhosis of liver ([place])   . Dyslipidemia   . Elevated PSA   . Gastric reflux   . Gout   . HTN (hypertension)   . Over weight     . Psoriasis   . Testicular pain, right   . Urinary frequency      Past Surgical History:  Procedure Laterality Date  . hydrocelectomy    . IR FLUORO GUIDE PORT INSERTION RIGHT  04/11/2017  . PILONIDAL CYST EXCISION      Social History   Socioeconomic History  . Marital status: Married    Spouse name: Not on file  . Number of children: Not on file  . Years of education: Not on file  . Highest education level: Not on file  Occupational History  . Not on file  Social Needs  . Financial resource strain: Not on file  . Food insecurity:    Worry: Not on file    Inability: Not on file  . Transportation needs:    Medical: Not on file    Non-medical: Not on file  Tobacco Use  . Smoking status: Former Smoker    Packs/day: 1.50    Years: 8.00    Pack years: 12.00    Types: Cigarettes  . Smokeless tobacco: Never Used  . Tobacco comment: quit 40 years ago  Substance and Sexual Activity  . Alcohol use: No    Alcohol/week: 0.0 standard drinks    Comment: rarely  . Drug use: No  . Sexual activity: Not on file  Lifestyle  . Physical activity:    Days per week: Not on file    Minutes per session: Not on file  . Stress: Not on file  Relationships  . Social connections:    Talks on phone: Not on file    Gets together: Not on file    Attends religious service: Not on file    Active member of club or organization: Not on file    Attends meetings of clubs or  organizations: Not on file    Relationship status: Not on file  . Intimate partner violence:    Fear of current or ex partner: Not on file    Emotionally abused: Not on file    Physically abused: Not on file    Forced sexual activity: Not on file  Other Topics Concern  . Not on file  Social History Narrative  . Not on file    Family History  Problem Relation Age of Onset  . Uterine cancer Mother   . Diabetes Mother   . Cancer Mother   . Cancer Maternal Uncle   . Bladder Cancer Neg Hx   . Kidney disease Neg Hx    . Prostate cancer Neg Hx      Current Outpatient Medications:  .  allopurinol (ZYLOPRIM) 300 MG tablet, Take 1 tablet (300 mg total) by mouth daily., Disp: 30 tablet, Rfl: 3 .  amLODipine (NORVASC) 10 MG tablet, TAKE 1 TABLET BY MOUTH DAILY, Disp: 30 tablet, Rfl: 11 .  aspirin EC 81 MG tablet, Take 81 mg by mouth daily. , Disp: , Rfl:  .  Cholecalciferol (VITAMIN D3) 1000 UNITS CAPS, Take 1,000 Units by mouth daily. , Disp: , Rfl:  .  finasteride (PROSCAR) 5 MG tablet, Take 1 tablet (5 mg total) by mouth daily., Disp: 90 tablet, Rfl: 3 .  losartan (COZAAR) 100 MG tablet, Take 1 tablet (100 mg total) by mouth daily., Disp: 90 tablet, Rfl: 3 .  allopurinol (ZYLOPRIM) 300 MG tablet, TAKE ONE TABLET BY MOUTH EVERY DAY, Disp: 30 tablet, Rfl: 5 .  allopurinol (ZYLOPRIM) 300 MG tablet, TAKE ONE TABLET BY MOUTH EVERY DAY, Disp: 30 tablet, Rfl: 5 No current facility-administered medications for this visit.   Facility-Administered Medications Ordered in Other Visits:  .  sodium chloride flush (NS) 0.9 % injection 10 mL, 10 mL, Intravenous, PRN, Sindy Guadeloupe, MD, 10 mL at 05/12/18 1421  Physical exam:  Vitals:   05/12/18 1429  BP: (!) 165/81  Pulse: 93  Resp: 18  Temp: 98.5 F (36.9 C)  TempSrc: Tympanic  SpO2: 96%  Weight: 293 lb 14.4 oz (133.3 kg)  Height: '5\' 10"'$  (1.778 m)   Physical Exam  Constitutional: He is oriented to person, place, and time.  He is obese.  Does not appear to be in any acute distress  HENT:  Head: Normocephalic and atraumatic.  Eyes: Pupils are equal, round, and reactive to light. EOM are normal.  Neck: Normal range of motion.  Cardiovascular: Normal rate, regular rhythm and normal heart sounds.  Pulmonary/Chest: Effort normal and breath sounds normal.  Abdominal: Soft. Bowel sounds are normal.  Lymphadenopathy:  No palpable cervical, supraclavicular, axillary or inguinal adenopathy   Neurological: He is alert and oriented to person, place, and time.   Skin: Skin is warm and dry.     CMP Latest Ref Rng & Units 05/12/2018  Glucose 70 - 99 mg/dL 207(H)  BUN 8 - 23 mg/dL 14  Creatinine 0.61 - 1.24 mg/dL 0.83  Sodium 135 - 145 mmol/L 139  Potassium 3.5 - 5.1 mmol/L 3.3(L)  Chloride 98 - 111 mmol/L 105  CO2 22 - 32 mmol/L 23  Calcium 8.9 - 10.3 mg/dL 9.0  Total Protein 6.5 - 8.1 g/dL 7.4  Total Bilirubin 0.3 - 1.2 mg/dL 0.5  Alkaline Phos 38 - 126 U/L 61  AST 15 - 41 U/L 24  ALT 0 - 44 U/L 20   CBC Latest Ref Rng & Units  05/12/2018  WBC 3.8 - 10.6 K/uL 7.0  Hemoglobin 13.0 - 18.0 g/dL 14.1  Hematocrit 40.0 - 52.0 % 40.2  Platelets 150 - 440 K/uL 233    No images are attached to the encounter.  Nm Pet Image Restag (ps) Skull Base To Thigh  Result Date: 05/08/2018 CLINICAL DATA:  Subsequent treatment strategy for diffuse large B-cell lymphoma. EXAM: NUCLEAR MEDICINE PET SKULL BASE TO THIGH TECHNIQUE: 15.57 mCi F-18 FDG was injected intravenously. Full-ring PET imaging was performed from the skull base to thigh after the radiotracer. CT data was obtained and used for attenuation correction and anatomic localization. Fasting blood glucose: 174 mg/dl COMPARISON:  11/17/2017 FINDINGS: Mediastinal blood pool activity: SUV max 3.28 NECK: No hypermetabolic lymph nodes in the neck. Incidental CT findings: none CHEST: No hypermetabolic supraclavicular, axillary, mediastinal or hilar lymph nodes. No pleural effusion identified. No hypermetabolic pulmonary nodules or mass identified. Incidental CT findings: none ABDOMEN/PELVIS: No abnormal uptake within the liver, pancreas, or spleen. No abnormal uptake within the adrenal glands. Jejunal mesenteric mass is again identified. The margins of this mass are difficult to accurately identified due to lack of oral and IV contrast material. This measures approximate 12.8 by 4.5 with SUV max of 4.46 (Deauville criteria 3). Previously this measured 13.6 by 8.0 within SUV max of 4.1. No new sites of disease  identified. Incidental CT findings: Aortic atherosclerosis noted. No aneurysm. Left kidney cyst identified. SKELETON: No focal hypermetabolic activity to suggest skeletal metastasis. Incidental CT findings: none IMPRESSION: 1. Mesenteric soft tissue mass is again noted within the left hemiabdomen. Size of this lesion is difficult to quantify due to lack of IV contrast material. There is mild FDG uptake within this lesion (Deauville criteria 3) compatible with treated lymphoma. 2. No new sites of disease identified. 3.  Aortic Atherosclerosis (ICD10-I70.0). Electronically Signed   By: Kerby Moors M.D.   On: 05/08/2018 13:46     Assessment and plan- Patient is a 67 y.o. male h/o Stage IV DLBCL GCB, not double hit, low intermediaterisk IPIstatus post 6 cycles of R CHOP chemotherapy.  He is here for routine surveillance of his dlbcl.  I have personally reviewed the PET/CT images independently and discussed findings with the patient.Patient continues to have persistent mesenteric mass about 12.8 cm with an SUV of 4.4 which is essentially unchanged as compared to his prior PET CT scans with a Deauville score of 3.  Patient completed chemotherapy in November 2018 and his mass has not grown or increased in metabolic activity over the last 9 months.  I therefore do not think that this is residual disease.  Also this area makes it challenging for rebiopsy.  At this time I will plan to get a repeat CT chest abdomen and pelvis in 6 months time.  I will see him in 6 months with a CBC CMP and LDH prior.  He knows to call in the interim if he develops any new B symptoms such as fatigue, unintentional weight loss, drenching night sweats or lumps or bumps anywhere.  I will continue to get monitoring surveillance scans up to 2 years after his treatment when the risk of recurrence is the highest for dlbcl  I have encouraged him to work out on his treadmill or join a gym for weight loss and that would also help him with  his position sense.  He may have some mild neuropathy due to vincristine that he received as a part of R-CHOP chemotherapy.    Visit  Diagnosis 1. Encounter for follow-up surveillance of diffuse large B-cell lymphoma      Dr. Randa Evens, MD, MPH [facility] [phone] 05/14/2018  8:17 AM

## 2018-05-15 ENCOUNTER — Telehealth: Payer: Self-pay

## 2018-05-15 NOTE — Telephone Encounter (Signed)
Spoke with the patient to inform him of the appointment time and date for [facility] removal / I have instructed the patient to please arrive at 7:30 AM on 05/29/18, please have a driver , Nothing to eat or drink after midnight, only take medication with a sip of water the morning of. The patient was agreeable and understanding to be at the appointment.

## 2018-05-28 ENCOUNTER — Other Ambulatory Visit: Payer: Self-pay | Admitting: Radiology

## 2018-05-29 ENCOUNTER — Ambulatory Visit
Admission: RE | Admit: 2018-05-29 | Discharge: 2018-05-29 | Disposition: A | Discharge status: Home / Self Care | Payer: Medicare Other | Source: Ambulatory Visit | Attending: Oncology | Admitting: Oncology

## 2018-05-29 DIAGNOSIS — E785 Hyperlipidemia, unspecified: Secondary | ICD-10-CM | POA: Diagnosis not present

## 2018-05-29 DIAGNOSIS — M1990 Unspecified osteoarthritis, unspecified site: Secondary | ICD-10-CM | POA: Diagnosis not present

## 2018-05-29 DIAGNOSIS — Z9221 Personal history of antineoplastic chemotherapy: Secondary | ICD-10-CM | POA: Insufficient documentation

## 2018-05-29 DIAGNOSIS — K7460 Unspecified cirrhosis of liver: Secondary | ICD-10-CM | POA: Diagnosis not present

## 2018-05-29 DIAGNOSIS — Z87891 Personal history of nicotine dependence: Secondary | ICD-10-CM | POA: Insufficient documentation

## 2018-05-29 DIAGNOSIS — K219 Gastro-esophageal reflux disease without esophagitis: Secondary | ICD-10-CM | POA: Diagnosis not present

## 2018-05-29 DIAGNOSIS — Z6841 Body Mass Index (BMI) 40.0 and over, adult: Secondary | ICD-10-CM | POA: Insufficient documentation

## 2018-05-29 DIAGNOSIS — Z7982 Long term (current) use of aspirin: Secondary | ICD-10-CM | POA: Diagnosis not present

## 2018-05-29 DIAGNOSIS — I10 Essential (primary) hypertension: Secondary | ICD-10-CM | POA: Diagnosis not present

## 2018-05-29 DIAGNOSIS — Z452 Encounter for adjustment and management of vascular access device: Secondary | ICD-10-CM | POA: Diagnosis not present

## 2018-05-29 DIAGNOSIS — G629 Polyneuropathy, unspecified: Secondary | ICD-10-CM | POA: Insufficient documentation

## 2018-05-29 DIAGNOSIS — C8333 Diffuse large B-cell lymphoma, intra-abdominal lymph nodes: Secondary | ICD-10-CM

## 2018-05-29 DIAGNOSIS — L409 Psoriasis, unspecified: Secondary | ICD-10-CM | POA: Diagnosis not present

## 2018-05-29 DIAGNOSIS — E669 Obesity, unspecified: Secondary | ICD-10-CM | POA: Insufficient documentation

## 2018-05-29 DIAGNOSIS — N400 Enlarged prostate without lower urinary tract symptoms: Secondary | ICD-10-CM | POA: Diagnosis not present

## 2018-05-29 DIAGNOSIS — Z8572 Personal history of non-Hodgkin lymphomas: Secondary | ICD-10-CM | POA: Diagnosis not present

## 2018-05-29 DIAGNOSIS — Z5111 Encounter for antineoplastic chemotherapy: Secondary | ICD-10-CM | POA: Diagnosis not present

## 2018-05-29 DIAGNOSIS — M109 Gout, unspecified: Secondary | ICD-10-CM | POA: Insufficient documentation

## 2018-05-29 HISTORY — PX: IR REMOVAL TUN ACCESS W/ PORT W/O FL MOD SED: IMG2290

## 2018-05-29 LAB — BASIC METABOLIC PANEL
Anion gap: 11 (ref 5–15)
BUN: 11 mg/dL (ref 8–23)
CHLORIDE: 101 mmol/L (ref 98–111)
CO2: 28 mmol/L (ref 22–32)
Calcium: 9 mg/dL (ref 8.9–10.3)
Creatinine, Ser: 0.76 mg/dL (ref 0.61–1.24)
GFR calc Af Amer: >60 mL/min (ref >60)
GFR calc non Af Amer: >60 mL/min (ref >60)
GLUCOSE: 209 mg/dL — AB (ref 70–99)
POTASSIUM: 3.5 mmol/L (ref 3.5–5.1)
Sodium: 140 mmol/L (ref 135–145)

## 2018-05-29 LAB — CBC
HEMATOCRIT: 40.6 % (ref 40.0–52.0)
Hemoglobin: 14.3 g/dL (ref 13.0–18.0)
MCH: 30.1 pg (ref 26.0–34.0)
MCHC: 35.3 g/dL (ref 32.0–36.0)
MCV: 85.4 fL (ref 80.0–100.0)
Platelets: 224 10*3/uL (ref 150–440)
RBC: 4.75 MIL/uL (ref 4.40–5.90)
RDW: 14.3 % (ref 11.5–14.5)
WBC: 5.9 10*3/uL (ref 3.8–10.6)

## 2018-05-29 LAB — PROTIME-INR
INR: 0.88
Prothrombin Time: 11.9 seconds (ref 11.4–15.2)

## 2018-05-29 MED ORDER — CEFAZOLIN SODIUM-DEXTROSE 2-4 GM/100ML-% IV SOLN
2.0000 g | INTRAVENOUS | Status: AC
  Administered 2018-05-29: 2 g via INTRAVENOUS

## 2018-05-29 MED ORDER — FENTANYL CITRATE (PF) 100 MCG/2ML IJ SOLN
INTRAMUSCULAR | Status: AC | PRN
  Administered 2018-05-29: 50 ug via INTRAVENOUS
  Administered 2018-05-29: 25 ug via INTRAVENOUS

## 2018-05-29 MED ORDER — SODIUM CHLORIDE 0.9 % IV SOLN
INTRAVENOUS | Status: DC
  Administered 2018-05-29: 08:00:00 via INTRAVENOUS

## 2018-05-29 MED ORDER — LIDOCAINE HCL (PF) 1 % IJ SOLN
INTRAMUSCULAR | Status: AC | PRN
  Administered 2018-05-29: 10 mL

## 2018-05-29 MED ORDER — CEFAZOLIN SODIUM-DEXTROSE 2-4 GM/100ML-% IV SOLN
INTRAVENOUS | Status: AC
  Filled 2018-05-29: qty 100

## 2018-05-29 MED ORDER — MIDAZOLAM HCL 5 MG/5ML IJ SOLN
INTRAMUSCULAR | Status: AC | PRN
  Administered 2018-05-29 (×2): 1 mg via INTRAVENOUS

## 2018-05-29 MED ORDER — LIDOCAINE-EPINEPHRINE (PF) 1 %-1:200000 IJ SOLN
INTRAMUSCULAR | Status: AC
  Filled 2018-05-29: qty 30

## 2018-05-29 MED ORDER — FENTANYL CITRATE (PF) 100 MCG/2ML IJ SOLN
INTRAMUSCULAR | Status: AC
  Filled 2018-05-29: qty 4

## 2018-05-29 MED ORDER — MIDAZOLAM HCL 5 MG/5ML IJ SOLN
INTRAMUSCULAR | Status: AC
  Filled 2018-05-29: qty 5

## 2018-05-29 NOTE — Procedures (Signed)
Pre Procedural Dx: Poor venous access Post Procedural Dx: Same  Successful removal of anterior chest wall port-a-cath.  EBL: Minimal  No immediate post procedural complications.   Jay Elayjah Chaney, MD Pager #: [phone]   

## 2018-05-29 NOTE — Consult Note (Signed)
Chief Complaint: Port Removal  Referring Physician(s): Rao,Archana C  Patient Status: [facility] - Out-pt  History of Present Illness: Peter Ryan is a 67 y.o. male with past medical history significant for cirrhosis, gout, hypertension, psoriasis obesity and non-Hodgkin's lymphoma who presents today for portacatheter removal following completion of regimen of chemotherapy.  The portacatheter was placed by Dr. Kathlene Cote in November 2018 and function well throughout duration of usage.  There is no clinical sign or concern of infection.  Patient continues to complain of bilateral lower extremity neuropathy/weakness but is otherwise without complaint.  Specifically, no fever or chills.  No chest pain or shortness of breath.    Past Medical History:  Diagnosis Date  . Arthritis   . BPH (benign prostatic hyperplasia)   . Cancer ([place])    Non-Hodgkins lymphoma  . Chronic prostatitis   . Cirrhosis of liver ([place])   . Dyslipidemia   . Elevated PSA   . Gastric reflux   . Gout   . HTN (hypertension)   . Over weight   . Psoriasis   . Testicular pain, right   . Urinary frequency     Past Surgical History:  Procedure Laterality Date  . hydrocelectomy    . IR FLUORO GUIDE PORT INSERTION RIGHT  04/11/2017  . PILONIDAL CYST EXCISION      Allergies: Metformin  Medications: Prior to Admission medications   Medication Sig Start Date End Date Taking? Authorizing Provider  allopurinol (ZYLOPRIM) 300 MG tablet Take 1 tablet (300 mg total) by mouth daily. 04/08/17  Yes Sindy Guadeloupe, MD  amLODipine (NORVASC) 10 MG tablet TAKE 1 TABLET BY MOUTH DAILY 12/22/17  Yes [name]., MD  aspirin EC 81 MG tablet Take 81 mg by mouth daily.    Yes [provider]  Cholecalciferol (VITAMIN D3) 1000 UNITS CAPS Take 1,000 Units by mouth daily.    Yes [provider]  finasteride (PROSCAR) 5 MG tablet Take 1 tablet (5 mg total) by mouth daily. 11/26/17  Yes McGowan, Larene Beach  A, PA-C  losartan (COZAAR) 100 MG tablet Take 1 tablet (100 mg total) by mouth daily. 12/03/17  Yes [name]., MD  allopurinol (ZYLOPRIM) 300 MG tablet TAKE ONE TABLET BY MOUTH EVERY DAY 12/11/17   [name]., MD  allopurinol (ZYLOPRIM) 300 MG tablet TAKE ONE TABLET BY MOUTH EVERY DAY 01/06/18   [name]., MD     Family History  Problem Relation Age of Onset  . Uterine cancer Mother   . Diabetes Mother   . Cancer Mother   . Cancer Maternal Uncle   . Bladder Cancer Neg Hx   . Kidney disease Neg Hx   . Prostate cancer Neg Hx     Social History   Socioeconomic History  . Marital status: Married    Spouse name: Not on file  . Number of children: Not on file  . Years of education: Not on file  . Highest education level: Not on file  Occupational History  . Not on file  Social Needs  . Financial resource strain: Not on file  . Food insecurity:    Worry: Not on file    Inability: Not on file  . Transportation needs:    Medical: Not on file    Non-medical: Not on file  Tobacco Use  . Smoking status: Former Smoker    Packs/day: 1.50    Years: 8.00    Pack years: 12.00  Types: Cigarettes    Last attempt to quit: 05/29/1974    Years since quitting: 44.0  . Smokeless tobacco: Never Used  . Tobacco comment: quit 40 years ago  Substance and Sexual Activity  . Alcohol use: No    Alcohol/week: 0.0 standard drinks    Comment: rarely  . Drug use: No  . Sexual activity: Not on file  Lifestyle  . Physical activity:    Days per week: Not on file    Minutes per session: Not on file  . Stress: Not on file  Relationships  . Social connections:    Talks on phone: Not on file    Gets together: Not on file    Attends religious service: Not on file    Active member of club or organization: Not on file    Attends meetings of clubs or organizations: Not on file    Relationship status: Not on file  Other Topics Concern  . Not on file  Social  History Narrative  . Not on file    ECOG Status: 1 - Symptomatic but completely ambulatory  Review of Systems: A 12 point ROS discussed and pertinent positives are indicated in the HPI above.  All other systems are negative.  Review of Systems  Constitutional: Negative for activity change, chills, fever and unexpected weight change.  Cardiovascular: Negative.   Gastrointestinal: Negative.   Neurological: Positive for weakness and numbness.    Vital Signs: BP (!) 147/81   Pulse 90   Temp 98 F (36.7 C) (Oral)   Resp 16   Wt 131.5 kg   SpO2 97%   BMI 41.61 kg/m   Physical Exam  Constitutional: He appears well-developed and well-nourished.  Cardiovascular: Normal rate, regular rhythm and normal heart sounds.  Pulmonary/Chest: Effort normal.  Psychiatric: He has a normal mood and affect. His behavior is normal.  Nursing note and vitals reviewed.   Imaging: Nm Pet Image Restag (ps) Skull Base To Thigh  Result Date: 05/08/2018 CLINICAL DATA:  Subsequent treatment strategy for diffuse large B-cell lymphoma. EXAM: NUCLEAR MEDICINE PET SKULL BASE TO THIGH TECHNIQUE: 15.57 mCi F-18 FDG was injected intravenously. Full-ring PET imaging was performed from the skull base to thigh after the radiotracer. CT data was obtained and used for attenuation correction and anatomic localization. Fasting blood glucose: 174 mg/dl COMPARISON:  11/17/2017 FINDINGS: Mediastinal blood pool activity: SUV max 3.28 NECK: No hypermetabolic lymph nodes in the neck. Incidental CT findings: none CHEST: No hypermetabolic supraclavicular, axillary, mediastinal or hilar lymph nodes. No pleural effusion identified. No hypermetabolic pulmonary nodules or mass identified. Incidental CT findings: none ABDOMEN/PELVIS: No abnormal uptake within the liver, pancreas, or spleen. No abnormal uptake within the adrenal glands. Jejunal mesenteric mass is again identified. The margins of this mass are difficult to accurately  identified due to lack of oral and IV contrast material. This measures approximate 12.8 by 4.5 with SUV max of 4.46 (Deauville criteria 3). Previously this measured 13.6 by 8.0 within SUV max of 4.1. No new sites of disease identified. Incidental CT findings: Aortic atherosclerosis noted. No aneurysm. Left kidney cyst identified. SKELETON: No focal hypermetabolic activity to suggest skeletal metastasis. Incidental CT findings: none IMPRESSION: 1. Mesenteric soft tissue mass is again noted within the left hemiabdomen. Size of this lesion is difficult to quantify due to lack of IV contrast material. There is mild FDG uptake within this lesion (Deauville criteria 3) compatible with treated lymphoma. 2. No new sites of disease identified. 3.  Aortic  Atherosclerosis (ICD10-I70.0). Electronically Signed   By: Kerby Moors M.D.   On: 05/08/2018 13:46    Labs:  CBC: Recent Labs    01/21/18 0816 02/06/18 1413 05/12/18 1355 05/29/18 0751  WBC 5.3 7.1 7.0 5.9  HGB 13.9 13.4 14.1 14.3  HCT 40.6 37.9* 40.2 40.6  PLT 217 224 233 224    COAGS: Recent Labs    05/29/18 0751  INR 0.88    BMP: Recent Labs    01/21/18 0816 02/06/18 1413 05/12/18 1355 05/29/18 0751  NA 142 138 139 140  K 3.5 3.4* 3.3* 3.5  CL 99 102 105 101  CO2 24 24 23 28   GLUCOSE 183* 187* 207* 209*  BUN 11 13 14 11   CALCIUM 9.0 8.9 9.0 9.0  CREATININE 0.79 0.91 0.83 0.76  GFRNONAA 95 >60 >60 >60  GFRAA 110 >60 >60 >60    LIVER FUNCTION TESTS: Recent Labs    11/14/17 1548 01/21/18 0816 02/06/18 1413 05/12/18 1355  BILITOT 0.5 0.5 0.5 0.5  AST 25 18 24 24   ALT 20 20 17 20   ALKPHOS 54 71 71 61  PROT 7.0 6.9 7.3 7.4  ALBUMIN 3.7 4.4 3.9 4.1    TUMOR MARKERS: No results for input(s): AFPTM, CEA, CA199, CHROMGRNA in the last 8760 hours.  Assessment and Plan:  Peter Ryan is a 67 y.o. male with past medical history significant for cirrhosis, gout, hypertension, psoriasis obesity and non-Hodgkin's  lymphoma who presents today for portacatheter removal following completion of regimen of chemotherapy.  Risks and benefits of port-a-catheter removal was discussed with the patient including, but not limited to bleeding, infection, retention of foreign body  and need for additional procedures.  All of the patient's questions were answered, patient is agreeable to proceed. Consent signed and in chart.  Thank you for this interesting consult.  I greatly enjoyed meeting Peter Ryan and look forward to participating in their care.  A copy of this report was sent to the requesting provider on this date.  Electronically Signed: Sandi Mariscal, MD 05/29/2018, 8:30 AM   I spent a total of 15 Minutes in face to face in clinical consultation, greater than 50% of which was counseling/coordinating care for [facility] removal

## 2018-07-09 ENCOUNTER — Telehealth: Payer: Self-pay | Admitting: Family Medicine

## 2018-07-09 DIAGNOSIS — Z23 Encounter for immunization: Secondary | ICD-10-CM | POA: Diagnosis not present

## 2018-07-09 NOTE — Telephone Encounter (Signed)
I left a message asking the pt to call me at [phone] to schedule his Welcome to Medicare visit w/ Dr. Rosanna Randy (due before 03/01/2019). VDM (DD)

## 2018-07-13 ENCOUNTER — Other Ambulatory Visit: Payer: Self-pay | Admitting: Family Medicine

## 2018-07-16 NOTE — Telephone Encounter (Signed)
Welcome scheduled for 10/15/18

## 2018-07-23 ENCOUNTER — Ambulatory Visit (INDEPENDENT_AMBULATORY_CARE_PROVIDER_SITE_OTHER): Payer: Medicare Other | Admitting: Family Medicine

## 2018-07-23 VITALS — BP 148/82 | HR 89 | Temp 98.6°F | Resp 16 | Wt 299.0 lb

## 2018-07-23 DIAGNOSIS — Z794 Long term (current) use of insulin: Secondary | ICD-10-CM

## 2018-07-23 DIAGNOSIS — E118 Type 2 diabetes mellitus with unspecified complications: Secondary | ICD-10-CM

## 2018-07-23 DIAGNOSIS — C8333 Diffuse large B-cell lymphoma, intra-abdominal lymph nodes: Secondary | ICD-10-CM

## 2018-07-23 DIAGNOSIS — K7460 Unspecified cirrhosis of liver: Secondary | ICD-10-CM | POA: Diagnosis not present

## 2018-07-23 DIAGNOSIS — E785 Hyperlipidemia, unspecified: Secondary | ICD-10-CM | POA: Diagnosis not present

## 2018-07-23 DIAGNOSIS — I10 Essential (primary) hypertension: Secondary | ICD-10-CM | POA: Diagnosis not present

## 2018-07-23 DIAGNOSIS — M109 Gout, unspecified: Secondary | ICD-10-CM

## 2018-07-23 NOTE — Progress Notes (Signed)
Peter Ryan  MRN: [id number] DOB: January 25, 1951  Subjective:  HPI  The patient is a 67 year old male who presents today for follow up of his diabetes.  He was last seen on 05/12/18 for his Welcome to Medicare visit.   He was last seen for his diabetes on 01/21/18 and his A1C at that time was 7.3 which was up from 6.3.  Hypertension-The patient is also here to follow up his blood after having his Losartan increased from 50 to 100 mg.   Patient Active Problem List   Diagnosis Date Noted  . Goals of care, counseling/discussion 04/08/2017  . 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 ([place]) 04/08/2017  . Ascites 02/08/2017  . Liver cirrhosis ([place]) 02/08/2017  . SBP (spontaneous bacterial peritonitis) ([place]) 02/07/2017  . 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  . DOE (dyspnea on exertion) 11/15/2016  . Obesity, morbid (more than 100 lbs over ideal weight or BMI > 40) ([place]) 11/15/2016  . Elevated PSA 05/30/2015  . BPH with obstruction/lower urinary tract symptoms 05/30/2015  . Type 2 diabetes mellitus ([place]) 05/18/2014  . Arthritis 01/03/2014  . Dyslipidemia 01/03/2014  . Benign essential hypertension 01/03/2014  . Gout 01/03/2014  . Psoriasis 01/03/2014  . Reflux 01/03/2014    Past Medical History:  Diagnosis Date  . Arthritis   . BPH (benign prostatic hyperplasia)   . Cancer ([place])    Non-Hodgkins lymphoma  . Chronic prostatitis   . Cirrhosis of liver ([place])   . Dyslipidemia   . Elevated PSA   . Gastric reflux   . Gout   . HTN (hypertension)   . Over weight   . Psoriasis   . Testicular pain, right   . Urinary frequency     Social History   Socioeconomic History  . Marital status: Married    Spouse name: Not on file  . Number of children: Not on file  . Years of education: Not on file  . Highest education level: Not on file  Occupational History  . Not on file  Social Needs  . Financial resource strain: Not on file  .  Food insecurity:    Worry: Not on file    Inability: Not on file  . Transportation needs:    Medical: Not on file    Non-medical: Not on file  Tobacco Use  . Smoking status: Former Smoker    Packs/day: 1.50    Years: 8.00    Pack years: 12.00    Types: Cigarettes    Last attempt to quit: 05/29/1974    Years since quitting: 44.1  . Smokeless tobacco: Never Used  . Tobacco comment: quit 40 years ago  Substance and Sexual Activity  . Alcohol use: No    Alcohol/week: 0.0 standard drinks    Comment: rarely  . Drug use: No  . Sexual activity: Not on file  Lifestyle  . Physical activity:    Days per week: Not on file    Minutes per session: Not on file  . Stress: Not on file  Relationships  . Social connections:    Talks on phone: Not on file    Gets together: Not on file    Attends religious service: Not on file    Active member of club or organization: Not on file    Attends meetings of clubs or organizations: Not on file    Relationship status: Not on file  . Intimate partner violence:    Fear of  current or ex partner: Not on file    Emotionally abused: Not on file    Physically abused: Not on file    Forced sexual activity: Not on file  Other Topics Concern  . Not on file  Social History Narrative  . Not on file    Outpatient Encounter Medications as of 07/23/2018  Medication Sig  . allopurinol (ZYLOPRIM) 300 MG tablet Take 1 tablet (300 mg total) by mouth daily.  Marland Kitchen amLODipine (NORVASC) 10 MG tablet TAKE 1 TABLET BY MOUTH DAILY  . aspirin EC 81 MG tablet Take 81 mg by mouth daily.   . Cholecalciferol (VITAMIN D3) 1000 UNITS CAPS Take 1,000 Units by mouth daily.   . finasteride (PROSCAR) 5 MG tablet Take 1 tablet (5 mg total) by mouth daily.  Marland Kitchen losartan (COZAAR) 100 MG tablet Take 1 tablet (100 mg total) by mouth daily.  . [DISCONTINUED] allopurinol (ZYLOPRIM) 300 MG tablet TAKE ONE TABLET BY MOUTH EVERY DAY  . [DISCONTINUED] allopurinol (ZYLOPRIM) 300 MG tablet TAKE  ONE TABLET BY MOUTH EVERY DAY  . [DISCONTINUED] allopurinol (ZYLOPRIM) 300 MG tablet TAKE 1 TABLET BY MOUTH DAILY   Facility-Administered Encounter Medications as of 07/23/2018  Medication  . sodium chloride flush (NS) 0.9 % injection 10 mL    Allergies  Allergen Reactions  . Metformin Other (See Comments)    Stomach upset     Review of Systems  Constitutional: Positive for malaise/fatigue (since having chemo). Negative for fever.  HENT: Negative.   Eyes: Negative.   Respiratory: Negative for cough, shortness of breath and wheezing.   Cardiovascular: Negative for chest pain, palpitations, orthopnea, claudication and leg swelling.  Gastrointestinal: Negative.   Genitourinary: Negative.   Skin: Negative.   Neurological: Negative.   Endo/Heme/Allergies: Negative.   Psychiatric/Behavioral: Negative.     Objective:  BP (!) 148/82 (BP Location: Right Arm, Patient Position: Sitting, Cuff Size: Large)   Pulse 89   Temp 98.6 F (37 C) (Oral)   Resp 16   Wt 299 lb (135.6 kg)   BMI 42.90 kg/m   Physical Exam  Constitutional: He is oriented to person, place, and time and well-developed, well-nourished, and in no distress.  Obese white male in no acute distress  HENT:  Head: Normocephalic and atraumatic.  Right Ear: External ear normal.  Left Ear: External ear normal.  Nose: Nose normal.  Mouth/Throat: Oropharynx is clear and moist.  Eyes: Conjunctivae are normal. No scleral icterus.  Neck: No thyromegaly present.  Cardiovascular: Normal rate, regular rhythm and normal heart sounds.  Pulmonary/Chest: Effort normal and breath sounds normal.  Abdominal: Soft.  Musculoskeletal: He exhibits edema.  Trace edema.  Neurological: He is alert and oriented to person, place, and time. Gait normal. GCS score is 15.  Skin: Skin is warm.  Psychiatric: Mood, memory, affect and judgment normal.    Assessment and Plan :  1. Type 2 diabetes mellitus with complication, with long-term  current use of insulin (HCC) - Hemoglobin A1c Lifestyle is stressed to patient.  He knows he needs to lose weight. 2. Benign essential hypertension  - Comprehensive metabolic panel  3. Dyslipidemia  - Lipid panel  4. Gout, unspecified cause, unspecified chronicity, unspecified site To have uric acid under 6. - Uric acid  5. Cirrhosis of liver without ascites, unspecified hepatic cirrhosis type ([place])   6. 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 ([place]) Followed by hematology.  Patient is asymptomatic.  Overall feels pretty well.  I have done  the exam and reviewed the chart and it is accurate to the best of my knowledge. Development worker, community has been used and  any errors in dictation or transcription are unintentional. Miguel Aschoff M.D. [facility]

## 2018-07-31 DIAGNOSIS — E118 Type 2 diabetes mellitus with unspecified complications: Secondary | ICD-10-CM | POA: Diagnosis not present

## 2018-07-31 DIAGNOSIS — I10 Essential (primary) hypertension: Secondary | ICD-10-CM | POA: Diagnosis not present

## 2018-07-31 DIAGNOSIS — M109 Gout, unspecified: Secondary | ICD-10-CM | POA: Diagnosis not present

## 2018-07-31 DIAGNOSIS — E785 Hyperlipidemia, unspecified: Secondary | ICD-10-CM | POA: Diagnosis not present

## 2018-07-31 DIAGNOSIS — Z794 Long term (current) use of insulin: Secondary | ICD-10-CM | POA: Diagnosis not present

## 2018-08-01 LAB — COMPREHENSIVE METABOLIC PANEL
A/G RATIO: 1.8 (ref 1.2–2.2)
ALK PHOS: 67 IU/L (ref 39–117)
ALT: 21 IU/L (ref 0–44)
AST: 18 IU/L (ref 0–40)
Albumin: 4.4 g/dL (ref 3.6–4.8)
BILIRUBIN TOTAL: 0.3 mg/dL (ref 0.0–1.2)
BUN / CREAT RATIO: 16 (ref 10–24)
BUN: 12 mg/dL (ref 8–27)
CHLORIDE: 102 mmol/L (ref 96–106)
CO2: 21 mmol/L (ref 20–29)
Calcium: 8.8 mg/dL (ref 8.6–10.2)
Creatinine, Ser: 0.75 mg/dL — ABNORMAL LOW (ref 0.76–1.27)
GFR calc Af Amer: 111 mL/min/{1.73_m2} (ref >59)
GFR calc non Af Amer: 96 mL/min/{1.73_m2} (ref >59)
GLUCOSE: 226 mg/dL — AB (ref 65–99)
Globulin, Total: 2.4 g/dL (ref 1.5–4.5)
POTASSIUM: 3.5 mmol/L (ref 3.5–5.2)
Sodium: 142 mmol/L (ref 134–144)
Total Protein: 6.8 g/dL (ref 6.0–8.5)

## 2018-08-01 LAB — LIPID PANEL
CHOL/HDL RATIO: 6 ratio — AB (ref 0.0–5.0)
Cholesterol, Total: 180 mg/dL (ref 100–199)
HDL: 30 mg/dL — AB (ref >39)
TRIGLYCERIDES: 422 mg/dL — AB (ref 0–149)

## 2018-08-01 LAB — HEMOGLOBIN A1C
Est. average glucose Bld gHb Est-mCnc: 189 mg/dL
Hgb A1c MFr Bld: 8.2 % — ABNORMAL HIGH (ref 4.8–5.6)

## 2018-08-01 LAB — URIC ACID: URIC ACID: 4.5 mg/dL (ref 3.7–8.6)

## 2018-08-04 ENCOUNTER — Telehealth: Payer: Self-pay

## 2018-08-04 DIAGNOSIS — E785 Hyperlipidemia, unspecified: Secondary | ICD-10-CM

## 2018-08-04 NOTE — Telephone Encounter (Signed)
-----   Message from [name]., MD sent at 08/04/2018  1:00 PM EST ----- Diabetes and lipids both high.  Work hard on diet and exercise/lifestyle intervention.

## 2018-08-04 NOTE — Telephone Encounter (Signed)
LVMTRC 

## 2018-08-04 NOTE — Telephone Encounter (Signed)
Patient is concerned about his Triglycerides and would like to know if he could take a medication for his triglycerides levels to decrease. He has tried fish oil pills and states that it did not helping any.

## 2018-08-04 NOTE — Telephone Encounter (Signed)
Fenofibrate daily--RTC 1 month.

## 2018-08-05 MED ORDER — FENOFIBRATE 145 MG PO TABS: 145.0000 mg | ORAL_TABLET | Freq: Every day | ORAL | 3 refills | Status: DC

## 2018-08-05 NOTE — Telephone Encounter (Signed)
Advised patient. Medication was sent into the pharmacy.  

## 2018-08-10 IMAGING — CT CT ABD-PEL WO/W CM
3 of 12 series · 10 of 46 positions shown, 15 images · IV contrast (isovue)
Comparison: None.

CLINICAL DATA: Cirrhosis with ascites.

EXAM:
CT ABDOMEN AND PELVIS WITHOUT AND WITH CONTRAST
TECHNIQUE: Multidetector CT imaging of the abdomen and pelvis was performed
following the standard protocol before and following the bolus
administration of intravenous contrast.
CONTRAST:  125 cc Isovue 370

[Series 6: axial venous · axial · portal-venous · 0.98mm/px · z∈[-1024,-790]mm · 3 of 158 slices shown, 7 images]
[im 40/158  soft-tissue]
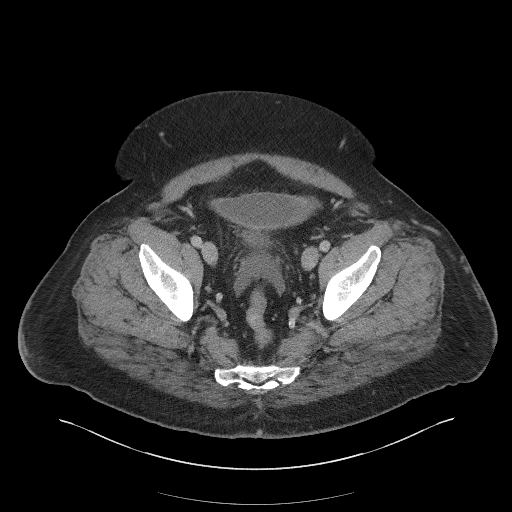
[im 40/158  lung]
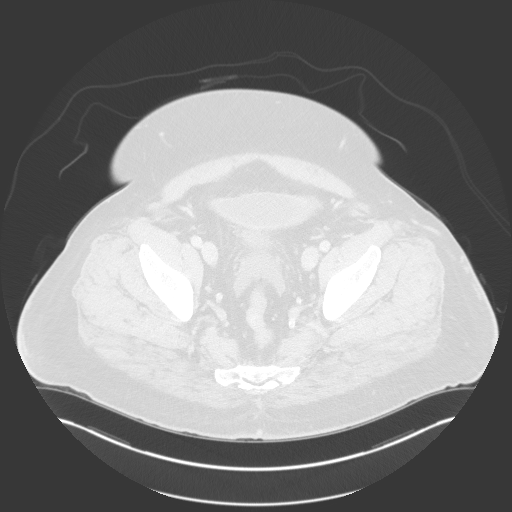
[im 40/158  bone]
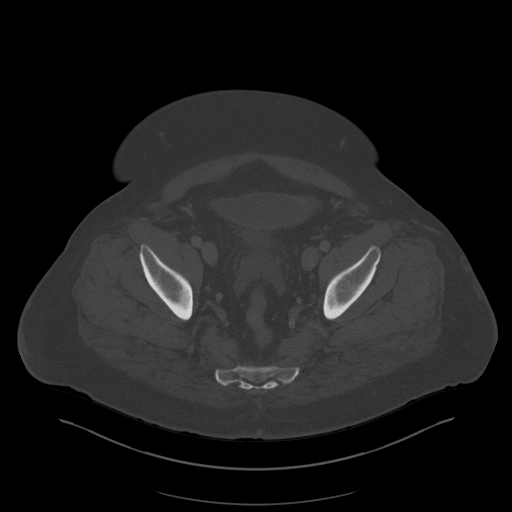
[im 79/158  soft-tissue]
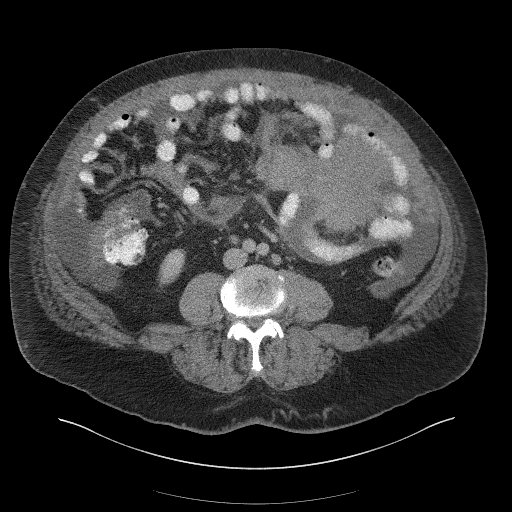
[im 79/158  lung]
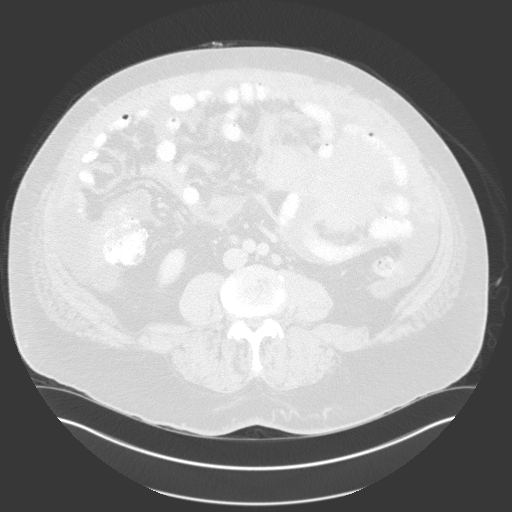
[im 118/158  soft-tissue]
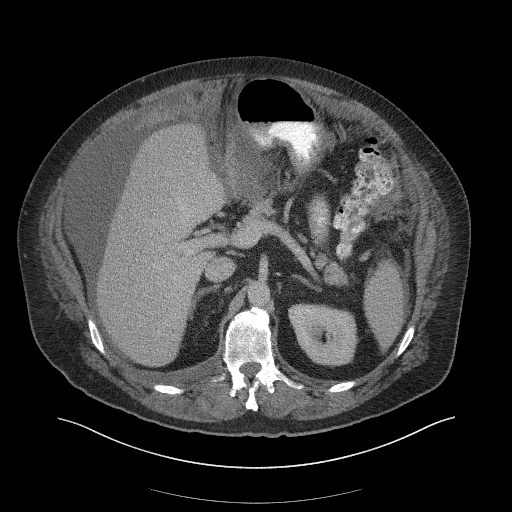
[im 118/158  lung]
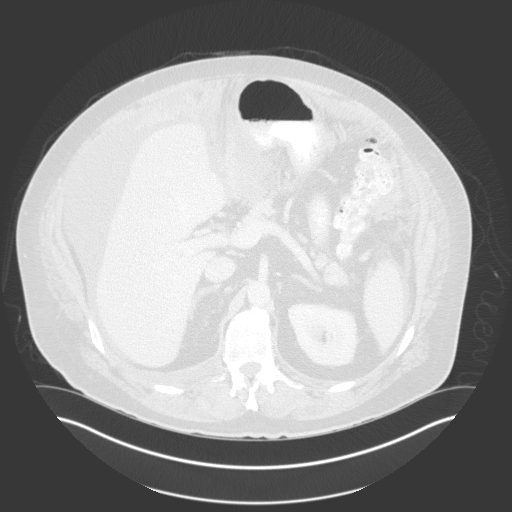

[Series 10: arterial thins 1 · axial · arterial · 0.98mm/px · z∈[-920,-820]mm · 4 of 567 slices shown]
[im 67/567  soft-tissue]
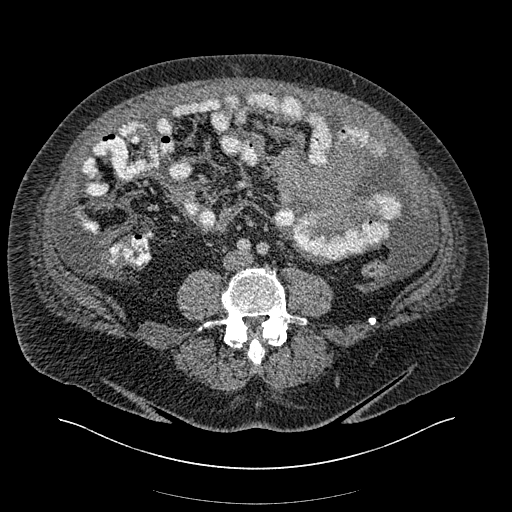
[im 134/567  soft-tissue]
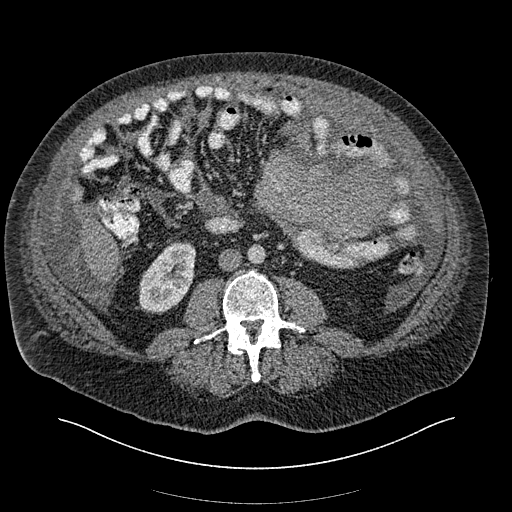
[im 200/567  soft-tissue]
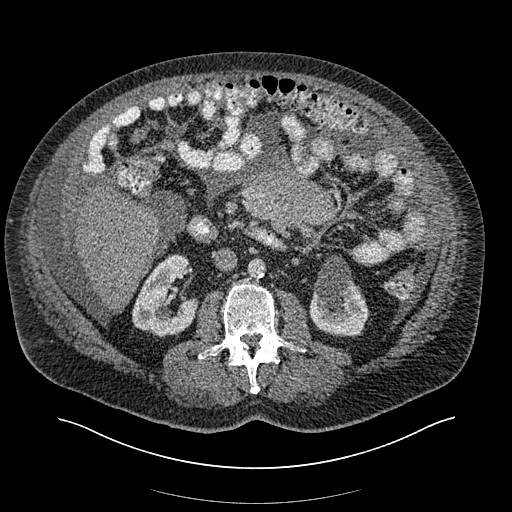
[im 267/567  soft-tissue]
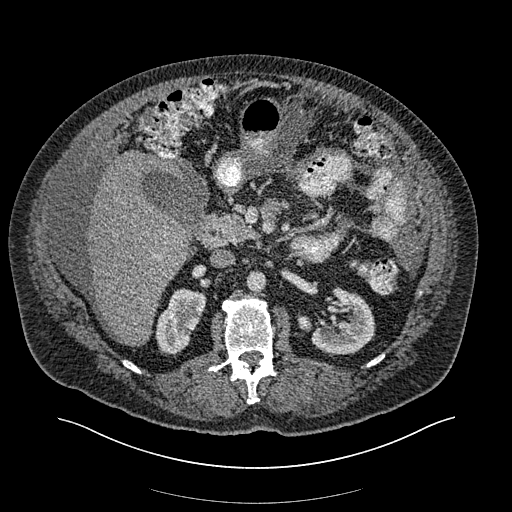

[Series 11: coronal arterial · coronal · arterial · 0.55mm/px · 3 of 127 slices shown, 4 images]
[im 32/127  soft-tissue]
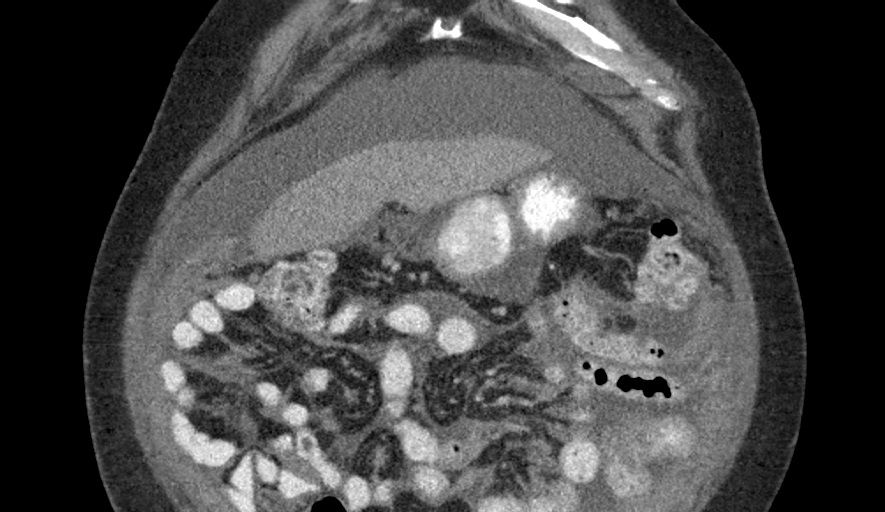
[im 64/127  soft-tissue]
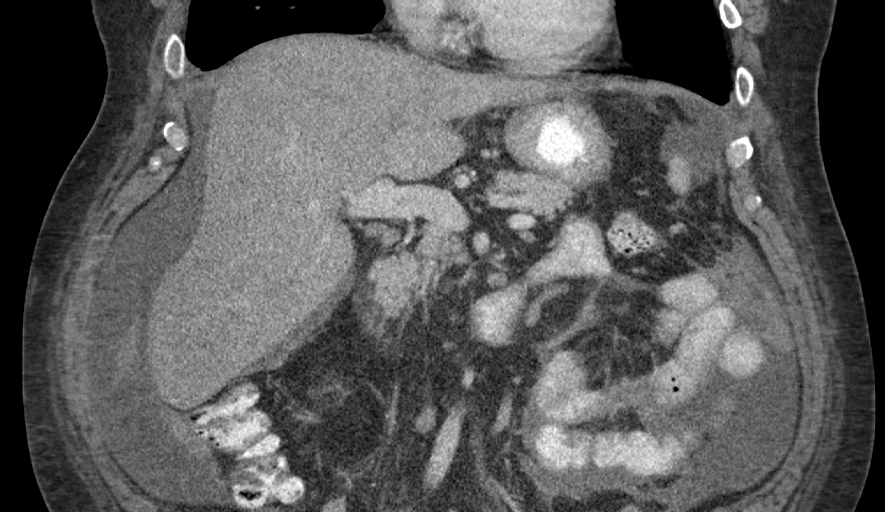
[im 64/127  bone]
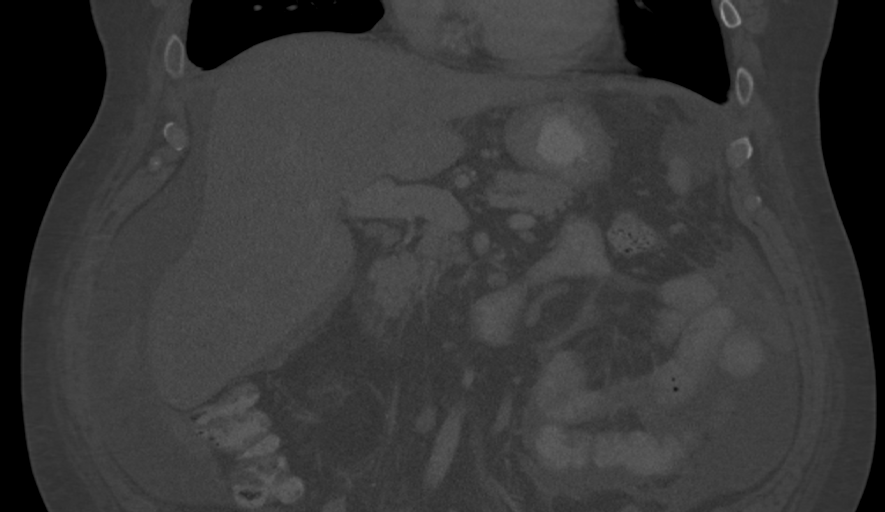
[im 95/127  soft-tissue]
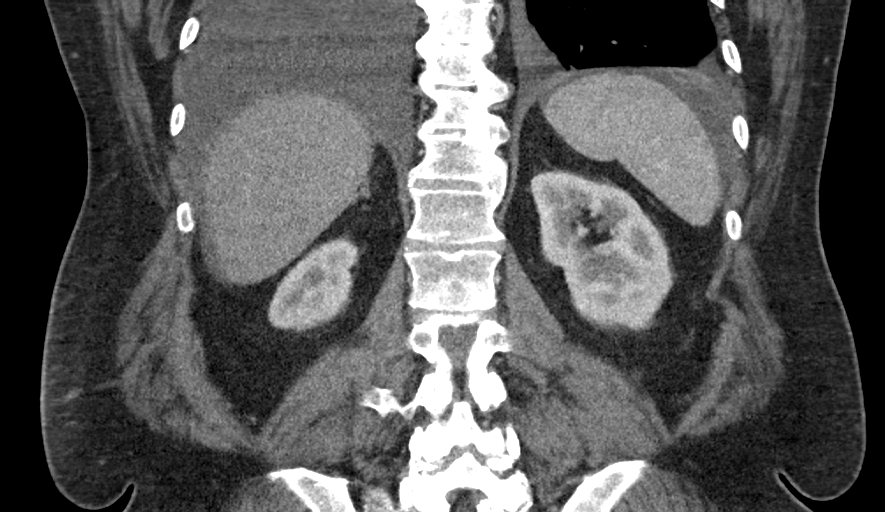

[10 of 46 positions shown; findings below may reference images not displayed]

FINDINGS: Lower chest: Dependent atelectasis noted right lower lobe with
moderate right pleural effusion and small left pleural effusion.
Enlarged anterior right juxta diaphragmatic lymph nodes are evident,
measuring up to 2.1 cm short axis on the right. Left anterior juxta
diaphragmatic or lower mediastinal lymph node measures 1.8 cm short
axis.

Hepatobiliary: Nodular liver contour with subtle heterogeneity of
the liver parenchyma is in keeping with the reported clinical
history of cirrhosis. No focal mass lesion identified within the
liver parenchyma. There is no evidence for gallstones, gallbladder
wall thickening, or pericholecystic fluid. No intrahepatic or
extrahepatic biliary dilation.

Pancreas: No focal mass lesion. No dilatation of the main duct. No
intraparenchymal cyst. No peripancreatic edema.

Spleen: No splenomegaly. No focal mass lesion.

Adrenals/Urinary Tract: No adrenal nodule or mass. Small low-density
lesions in the inferior right kidney cannot be fully characterized.
5.6 cm low-density lesion in the lower pole the right kidney
approaches water attenuation and is compatible with a cyst. No
evidence for hydroureter. The urinary bladder appears normal for the
degree of distention.

Stomach/Bowel: Stomach is nondistended. No gastric wall thickening.
No evidence of outlet obstruction. Duodenum is normally positioned
as is the ligament of Treitz. Visualized small bowel loops are
nondilated. Diverticular changes are noted in the left colon without
evidence of diverticulitis.

Vascular/Lymphatic: No abdominal aortic aneurysm. Small lymph nodes
are identified in the gastrohepatic ligament. 14 mm short axis
retrocaval lymph node is seen on image 38. 15 mm short axis right
retrocrural lymph node is visible on image 30. 7.3 x 13.5 cm mass is
identified in the central small bowel mesentery, circumferentially
encasing the superior mesenteric artery and vein. Diffuse omental
caking is associated.

Reproductive: The prostate gland and seminal vesicles have normal
imaging features.

Other: Small to moderate volume ascites. Peritoneal enhancement
identified in the pelvis raises concern for peritoneal
carcinomatosis.

Musculoskeletal: Bone windows reveal no worrisome lytic or sclerotic
osseous lesions.
IMPRESSION: 1. Bulky central mesenteric mass lesion encasing major mesenteric
vascular anatomy without substantial mass-effect. Diffuse omental
caking is evident and peritoneal enhancement is identified. There is
associated mesenteric, juxta diaphragmatic, retroperitoneal, and
retrocrural lymphadenopathy. Lymphoma would be a consideration.
Metastatic disease could also have this appearance.
2. Small a moderate volume ascites

## 2018-08-17 ENCOUNTER — Other Ambulatory Visit: Payer: Self-pay | Admitting: Urology

## 2018-08-25 ENCOUNTER — Encounter: Payer: Self-pay | Admitting: Family Medicine

## 2018-08-25 ENCOUNTER — Ambulatory Visit (INDEPENDENT_AMBULATORY_CARE_PROVIDER_SITE_OTHER): Payer: Medicare Other | Admitting: Family Medicine

## 2018-08-25 VITALS — BP 154/79 | HR 84 | Temp 98.0°F | Resp 16 | Wt 295.0 lb

## 2018-08-25 DIAGNOSIS — M722 Plantar fascial fibromatosis: Secondary | ICD-10-CM | POA: Diagnosis not present

## 2018-08-25 DIAGNOSIS — K7460 Unspecified cirrhosis of liver: Secondary | ICD-10-CM | POA: Diagnosis not present

## 2018-08-25 DIAGNOSIS — C8333 Diffuse large B-cell lymphoma, intra-abdominal lymph nodes: Secondary | ICD-10-CM

## 2018-08-25 DIAGNOSIS — I10 Essential (primary) hypertension: Secondary | ICD-10-CM | POA: Diagnosis not present

## 2018-08-25 MED ORDER — NAPROXEN 500 MG PO TABS: 500.0000 mg | ORAL_TABLET | Freq: Two times a day (BID) | ORAL | 0 refills | Status: DC | PRN

## 2018-08-25 NOTE — Progress Notes (Signed)
Patient: Peter [facility] Male    DOB: Mar 07, 1951   67 y.o.   MRN: [id number] Visit Date: 08/25/2018  Today's Provider: Wilhemena Durie, MD   Chief Complaint  Patient presents with  . Foot Pain   Subjective:    HPI Patient comes in today c/o pain in his right heel. Patient reports that he has had symptoms for about 3 days. He reports that he has never had this before. He denies any injuries. He does have history of gout and takes a medication daily (allopurinol) for that.     Allergies  Allergen Reactions  . Metformin Other (See Comments)    Stomach upset      Current Outpatient Medications:  .  allopurinol (ZYLOPRIM) 300 MG tablet, Take 1 tablet (300 mg total) by mouth daily., Disp: 30 tablet, Rfl: 3 .  amLODipine (NORVASC) 10 MG tablet, TAKE 1 TABLET BY MOUTH DAILY, Disp: 30 tablet, Rfl: 11 .  aspirin EC 81 MG tablet, Take 81 mg by mouth daily. , Disp: , Rfl:  .  Cholecalciferol (VITAMIN D3) 1000 UNITS CAPS, Take 1,000 Units by mouth daily. , Disp: , Rfl:  .  fenofibrate (TRICOR) 145 MG tablet, Take 1 tablet (145 mg total) by mouth daily., Disp: 30 tablet, Rfl: 3 .  finasteride (PROSCAR) 5 MG tablet, Take 1 tablet (5 mg total) by mouth daily., Disp: 90 tablet, Rfl: 3 .  losartan (COZAAR) 100 MG tablet, Take 1 tablet (100 mg total) by mouth daily., Disp: 90 tablet, Rfl: 3 No current facility-administered medications for this visit.   Facility-Administered Medications Ordered in Other Visits:  .  sodium chloride flush (NS) 0.9 % injection 10 mL, 10 mL, Intravenous, PRN, Sindy Guadeloupe, MD, 10 mL at 05/12/18 1421  Review of Systems  Constitutional: Positive for activity change. Negative for appetite change, chills, diaphoresis, fatigue, fever and unexpected weight change.  HENT: Negative.   Eyes: Negative.   Respiratory: Negative.   Cardiovascular: Negative for leg swelling.  Endocrine: Negative.   Genitourinary: Negative.   Musculoskeletal: Positive for  myalgias. Negative for gait problem and joint swelling.  Skin: Negative for color change, pallor and rash.  Allergic/Immunologic: Negative.   Neurological: Negative for numbness.  Psychiatric/Behavioral: Negative.     Social History   Tobacco Use  . Smoking status: Former Smoker    Packs/day: 1.50    Years: 8.00    Pack years: 12.00    Types: Cigarettes    Last attempt to quit: 05/29/1974    Years since quitting: 44.2  . Smokeless tobacco: Never Used  . Tobacco comment: quit 40 years ago  Substance Use Topics  . Alcohol use: No    Alcohol/week: 0.0 standard drinks    Comment: rarely   Objective:   BP (!) 154/79 (BP Location: Right Arm, Patient Position: Sitting, Cuff Size: Large) Comment: electronic cuff  Pulse 84   Temp 98 F (36.7 C)   Resp 16   Wt 295 lb (133.8 kg)   SpO2 96%   BMI 42.33 kg/m  Vitals:   08/25/18 1006  BP: (!) 154/79  Pulse: 84  Resp: 16  Temp: 98 F (36.7 C)  SpO2: 96%  Weight: 295 lb (133.8 kg)     Physical Exam  Constitutional: He is oriented to person, place, and time. He appears well-developed and well-nourished.  HENT:  Head: Normocephalic and atraumatic.  Right Ear: External ear normal.  Left Ear: External ear normal.  Nose: Nose  normal.  Mouth/Throat: Oropharynx is clear and moist.  Eyes: Conjunctivae are normal.  Neck: No thyromegaly present.  Cardiovascular: Normal rate, regular rhythm and normal heart sounds.  Pulmonary/Chest: Effort normal and breath sounds normal.  Abdominal: Soft.  Musculoskeletal: He exhibits tenderness. He exhibits no edema.  Tender  over insertion of the right plantar fascia into the calcaneus  Neurological: He is alert and oriented to person, place, and time.  Skin: Skin is warm and dry.  Psychiatric: He has a normal mood and affect. His behavior is normal. Judgment and thought content normal.        Assessment & Plan:     1. Plantar fasciitis  - naproxen (NAPROSYN) 500 MG tablet; Take 1  tablet (500 mg total) by mouth 2 (two) times daily as needed.  Dispense: 60 tablet; Refill: 0  2. Benign essential hypertension   3. Cirrhosis of liver without ascites, unspecified hepatic cirrhosis type ([place])   4. 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 (HCC)   5. Obesity, morbid (more than 100 lbs over ideal weight or BMI > 40) (HCC) Lifestyle  Discussed.      I have done the exam and reviewed the above chart and it is accurate to the best of my knowledge. Development worker, community has been used in this note in any air is in the dictation or transcription are unintentional.  Wilhemena Durie, MD  [place]

## 2018-08-31 IMAGING — CT CT BIOPSY AND ASPIRATION BONE MARROW
1 of 2 series · 10 of 14 positions shown, 13 images · non-contrast
Comparison: none

CLINICAL DATA: Diffuse large B-cell lymphoma and need for bone
marrow biopsy.

[Series 2: i-spiral 5.0 b30f · axial · 0.97mm/px · z∈[-242,-119]mm · 10 of 43 slices shown, 13 images]
[im 4/43  soft-tissue]
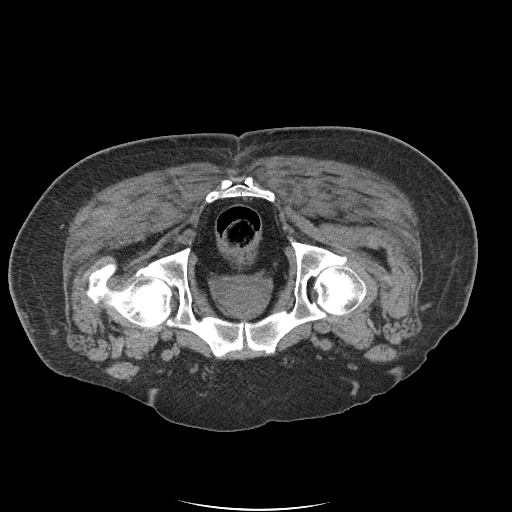
[im 4/43  bone]
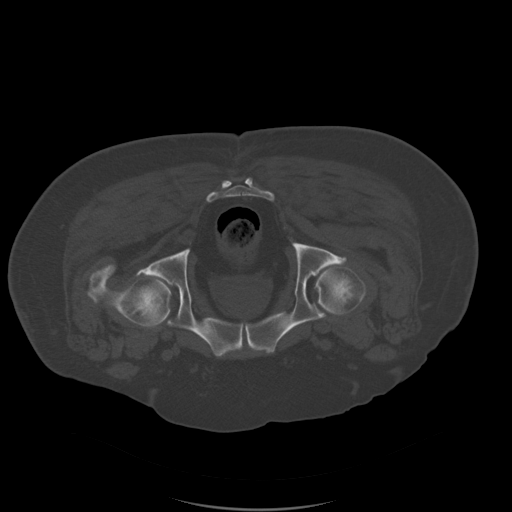
[im 8/43  bone]
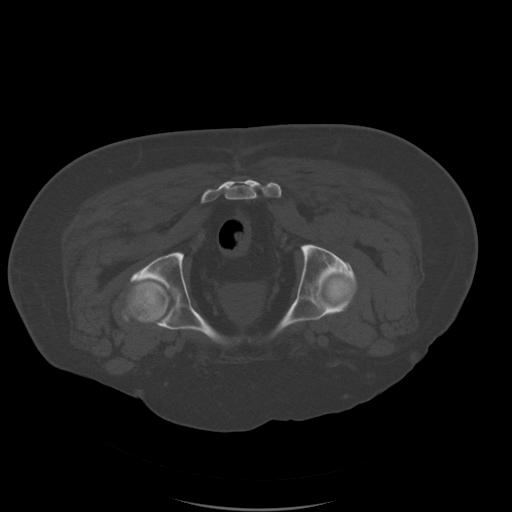
[im 12/43  bone]
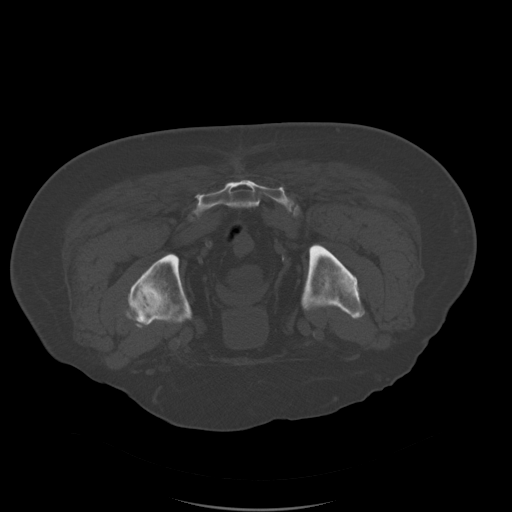
[im 16/43  bone]
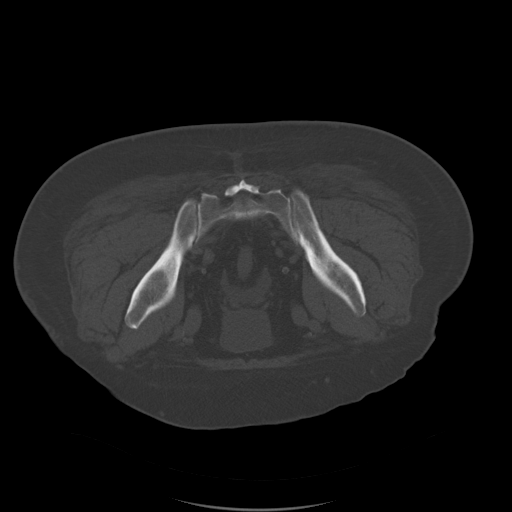
[im 20/43  soft-tissue]
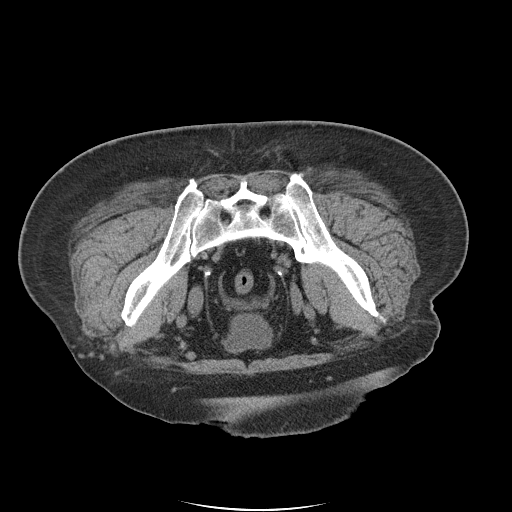
[im 20/43  bone]
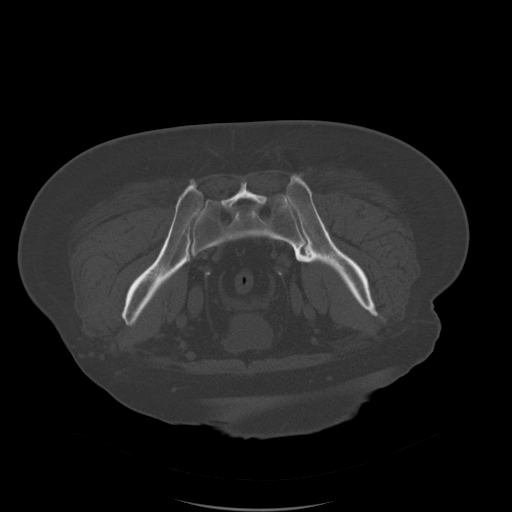
[im 23/43  bone]
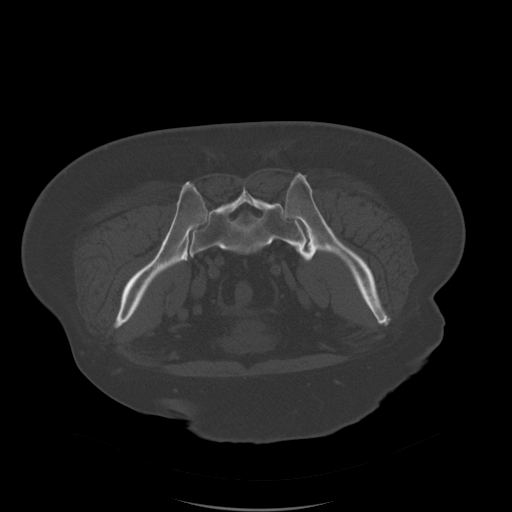
[im 27/43  bone]
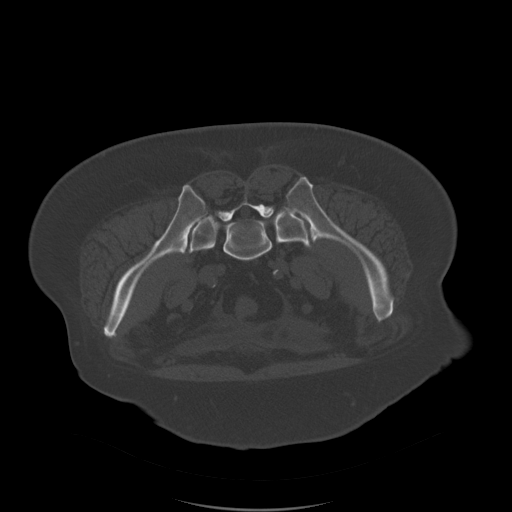
[im 31/43  bone]
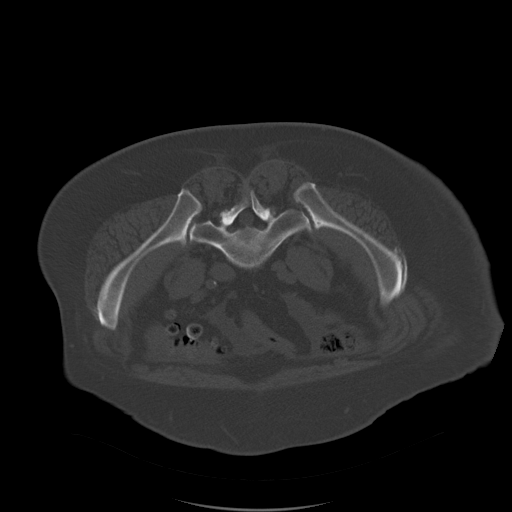
[im 35/43  soft-tissue]
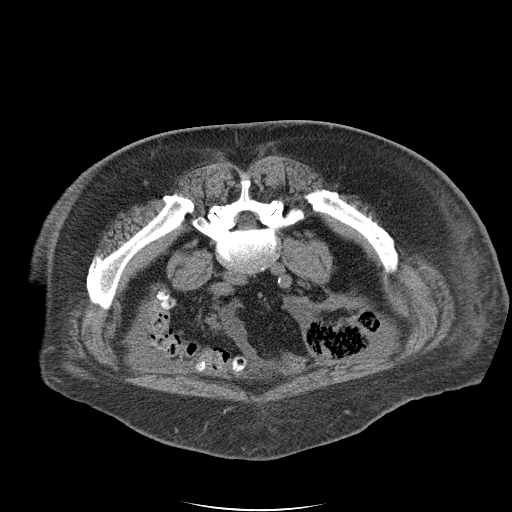
[im 35/43  bone]
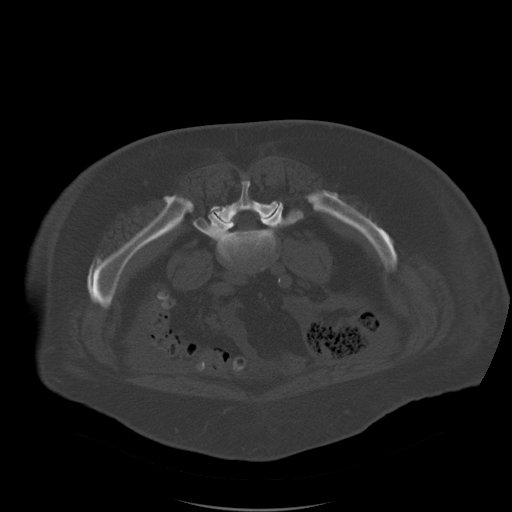
[im 39/43  bone]
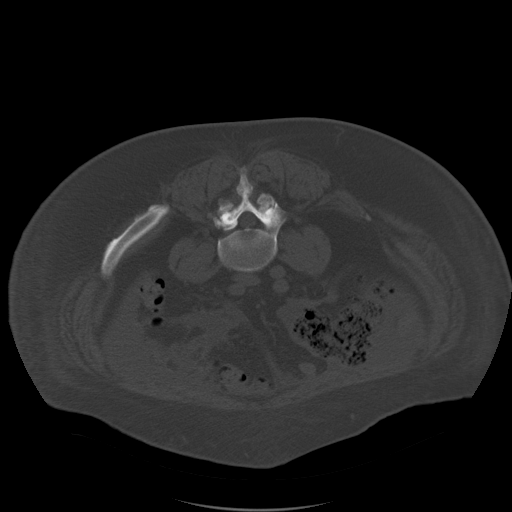

[10 of 14 positions shown; findings below may reference images not displayed]

EXAM:
CT GUIDED BONE MARROW ASPIRATION AND BIOPSY

ANESTHESIA/SEDATION:
Versed 2.0 mg IV, Fentanyl 100 mcg IV

Total Moderate Sedation Time: 20 minutes.

The patient's level of consciousness and physiologic status were
continuously monitored during the procedure by Radiology nursing.

PROCEDURE:
The procedure risks, benefits, and alternatives were explained to
the patient. Questions regarding the procedure were encouraged and
answered. The patient understands and consents to the procedure. A
time out was performed prior to initiating the procedure.

The right gluteal region was prepped with chlorhexidine. Sterile
gown and sterile gloves were used for the procedure. Local
anesthesia was provided with 1% Lidocaine.

Under CT guidance, an 11 gauge On Control bone cutting needle was
advanced from a posterior approach into the right iliac bone. Needle
positioning was confirmed with CT. Initial non heparinized and
heparinized aspirate samples were obtained of bone marrow. Core
biopsy was performed via the On Control drill needle.

COMPLICATIONS:
None
FINDINGS: Inspection of initial aspirate did reveal visible particles. Intact
core biopsy sample was obtained.
IMPRESSION: CT guided bone marrow biopsy of right posterior iliac bone with both
aspirate and core samples obtained.

## 2018-10-05 ENCOUNTER — Other Ambulatory Visit: Payer: Self-pay | Admitting: Family Medicine

## 2018-10-05 DIAGNOSIS — I10 Essential (primary) hypertension: Secondary | ICD-10-CM

## 2018-10-06 DIAGNOSIS — M1712 Unilateral primary osteoarthritis, left knee: Secondary | ICD-10-CM | POA: Diagnosis not present

## 2018-10-06 DIAGNOSIS — M1711 Unilateral primary osteoarthritis, right knee: Secondary | ICD-10-CM | POA: Diagnosis not present

## 2018-10-15 ENCOUNTER — Ambulatory Visit: Payer: Self-pay | Admitting: Family Medicine

## 2018-10-28 LAB — HM DIABETES EYE EXAM

## 2018-11-12 ENCOUNTER — Ambulatory Visit
Admission: RE | Admit: 2018-11-12 | Discharge: 2018-11-12 | Disposition: A | Discharge status: Home / Self Care | Payer: Medicare Other | Source: Ambulatory Visit | Attending: Oncology | Admitting: Oncology

## 2018-11-12 DIAGNOSIS — Z8579 Personal history of other malignant neoplasms of lymphoid, hematopoietic and related tissues: Secondary | ICD-10-CM | POA: Diagnosis not present

## 2018-11-12 DIAGNOSIS — R918 Other nonspecific abnormal finding of lung field: Secondary | ICD-10-CM | POA: Diagnosis not present

## 2018-11-12 DIAGNOSIS — Z08 Encounter for follow-up examination after completed treatment for malignant neoplasm: Secondary | ICD-10-CM | POA: Diagnosis not present

## 2018-11-12 DIAGNOSIS — K5730 Diverticulosis of large intestine without perforation or abscess without bleeding: Secondary | ICD-10-CM | POA: Diagnosis not present

## 2018-11-12 LAB — POCT I-STAT CREATININE: Creatinine, Ser: 0.7 mg/dL (ref 0.61–1.24)

## 2018-11-12 MED ORDER — IOHEXOL 300 MG/ML  SOLN
125.0000 mL | Freq: Once | INTRAMUSCULAR | Status: AC | PRN
  Administered 2018-11-12: 125 mL via INTRAVENOUS

## 2018-11-16 ENCOUNTER — Other Ambulatory Visit: Payer: Self-pay

## 2018-11-16 ENCOUNTER — Other Ambulatory Visit: Payer: Self-pay | Admitting: Urology

## 2018-11-16 MED ORDER — FINASTERIDE 5 MG PO TABS: 5.0000 mg | ORAL_TABLET | Freq: Every day | ORAL | 0 refills | Status: DC

## 2018-11-16 NOTE — Telephone Encounter (Signed)
RX sent

## 2018-11-16 NOTE — Telephone Encounter (Signed)
Pt called office to make 1 year follow up w/Shannon.  Appt was scheduled for 3/10.  Pt will run out of Finasteride before then and wants to know if we can call in a couple of weeks to get him through til appt date.  He uses [facility].

## 2018-11-17 ENCOUNTER — Inpatient Hospital Stay: Payer: Medicare Other | Attending: Oncology | Admitting: Oncology

## 2018-11-17 ENCOUNTER — Inpatient Hospital Stay: Payer: Medicare Other

## 2018-11-17 VITALS — BP 174/91 | HR 86 | Temp 98.3°F | Resp 18 | Wt 290.1 lb

## 2018-11-17 DIAGNOSIS — R188 Other ascites: Secondary | ICD-10-CM | POA: Insufficient documentation

## 2018-11-17 DIAGNOSIS — Z7982 Long term (current) use of aspirin: Secondary | ICD-10-CM

## 2018-11-17 DIAGNOSIS — T451X5A Adverse effect of antineoplastic and immunosuppressive drugs, initial encounter: Secondary | ICD-10-CM

## 2018-11-17 DIAGNOSIS — I10 Essential (primary) hypertension: Secondary | ICD-10-CM | POA: Diagnosis not present

## 2018-11-17 DIAGNOSIS — Z79899 Other long term (current) drug therapy: Secondary | ICD-10-CM | POA: Diagnosis not present

## 2018-11-17 DIAGNOSIS — Z8579 Personal history of other malignant neoplasms of lymphoid, hematopoietic and related tissues: Secondary | ICD-10-CM

## 2018-11-17 DIAGNOSIS — T451X5S Adverse effect of antineoplastic and immunosuppressive drugs, sequela: Secondary | ICD-10-CM | POA: Diagnosis not present

## 2018-11-17 DIAGNOSIS — N400 Enlarged prostate without lower urinary tract symptoms: Secondary | ICD-10-CM

## 2018-11-17 DIAGNOSIS — E669 Obesity, unspecified: Secondary | ICD-10-CM

## 2018-11-17 DIAGNOSIS — I700 Atherosclerosis of aorta: Secondary | ICD-10-CM

## 2018-11-17 DIAGNOSIS — R5383 Other fatigue: Secondary | ICD-10-CM | POA: Diagnosis not present

## 2018-11-17 DIAGNOSIS — K7460 Unspecified cirrhosis of liver: Secondary | ICD-10-CM | POA: Diagnosis not present

## 2018-11-17 DIAGNOSIS — C8333 Diffuse large B-cell lymphoma, intra-abdominal lymph nodes: Secondary | ICD-10-CM | POA: Insufficient documentation

## 2018-11-17 DIAGNOSIS — G620 Drug-induced polyneuropathy: Secondary | ICD-10-CM

## 2018-11-17 DIAGNOSIS — K219 Gastro-esophageal reflux disease without esophagitis: Secondary | ICD-10-CM | POA: Insufficient documentation

## 2018-11-17 DIAGNOSIS — Z87891 Personal history of nicotine dependence: Secondary | ICD-10-CM

## 2018-11-17 DIAGNOSIS — E785 Hyperlipidemia, unspecified: Secondary | ICD-10-CM | POA: Diagnosis not present

## 2018-11-17 DIAGNOSIS — M1990 Unspecified osteoarthritis, unspecified site: Secondary | ICD-10-CM | POA: Diagnosis not present

## 2018-11-17 DIAGNOSIS — R2689 Other abnormalities of gait and mobility: Secondary | ICD-10-CM

## 2018-11-17 DIAGNOSIS — Z08 Encounter for follow-up examination after completed treatment for malignant neoplasm: Secondary | ICD-10-CM

## 2018-11-17 LAB — COMPREHENSIVE METABOLIC PANEL
ALK PHOS: 64 U/L (ref 38–126)
ALT: 21 U/L (ref 0–44)
ANION GAP: 12 (ref 5–15)
AST: 21 U/L (ref 15–41)
Albumin: 4.1 g/dL (ref 3.5–5.0)
BILIRUBIN TOTAL: 0.5 mg/dL (ref 0.3–1.2)
BUN: 15 mg/dL (ref 8–23)
CALCIUM: 9.1 mg/dL (ref 8.9–10.3)
CO2: 25 mmol/L (ref 22–32)
Chloride: 102 mmol/L (ref 98–111)
Creatinine, Ser: 0.84 mg/dL (ref 0.61–1.24)
Glucose, Bld: 226 mg/dL — ABNORMAL HIGH (ref 70–99)
Potassium: 3.2 mmol/L — ABNORMAL LOW (ref 3.5–5.1)
SODIUM: 139 mmol/L (ref 135–145)
Total Protein: 7.7 g/dL (ref 6.5–8.1)

## 2018-11-17 LAB — CBC WITH DIFFERENTIAL/PLATELET
ABS IMMATURE GRANULOCYTES: 0.04 10*3/uL (ref 0.00–0.07)
BASOS PCT: 1 %
Basophils Absolute: 0 10*3/uL (ref 0.0–0.1)
EOS ABS: 0.2 10*3/uL (ref 0.0–0.5)
Eosinophils Relative: 2 %
HEMATOCRIT: 42.1 % (ref 39.0–52.0)
HEMOGLOBIN: 14.7 g/dL (ref 13.0–17.0)
Immature Granulocytes: 1 %
Lymphocytes Relative: 16 %
Lymphs Abs: 1.2 10*3/uL (ref 0.7–4.0)
MCH: 29.6 pg (ref 26.0–34.0)
MCHC: 34.9 g/dL (ref 30.0–36.0)
MCV: 84.7 fL (ref 80.0–100.0)
Monocytes Absolute: 0.6 10*3/uL (ref 0.1–1.0)
Monocytes Relative: 8 %
NEUTROS PCT: 72 %
NRBC: 0 % (ref 0.0–0.2)
Neutro Abs: 5.5 10*3/uL (ref 1.7–7.7)
Platelets: 218 10*3/uL (ref 150–400)
RBC: 4.97 MIL/uL (ref 4.22–5.81)
RDW: 13.2 % (ref 11.5–15.5)
WBC: 7.5 10*3/uL (ref 4.0–10.5)

## 2018-11-17 LAB — LACTATE DEHYDROGENASE: LDH: 133 U/L (ref 98–192)

## 2018-11-17 NOTE — Progress Notes (Signed)
Patient here today for CT results.  Offers no complaints. 

## 2018-11-20 ENCOUNTER — Encounter: Payer: Self-pay | Admitting: Oncology

## 2018-11-20 NOTE — Progress Notes (Signed)
Hematology/Oncology Consult note [facility]  Telephone:([phone] [phone]  Patient Care Team: [name]., MD as PCP - General (Family Medicine) [name], MD as Consulting Physician (Pulmonary Disease) Sindy Guadeloupe, MD as Consulting Physician (Oncology) Verlon Au, NP as Nurse Practitioner (Nurse Practitioner) Leona Singleton, RN as Oncology Nurse Navigator   Name of the patient: Peter Ryan  [id number]  1951-04-07   Date of visit: 11/20/18  Diagnosis- Stage IV DLBCL GCB IPI score- low intermediate risk  Chief complaint/ Reason for visit- discuss CT scan results  Heme/Onc history: 1. Patient is a 68 year old male who initially presented with symptoms of worsening shortness of breathand was found to have right pleural effusion on chest x-ray. CT chest with contrast on 11/12/2016 revealed multiple lymph nodes throughout the mediastinum measuring approximately 1 cm in short axis. Upper abdomen showed evidence of ascites and mild cirrhotic changes in the liver. Patient underwent thoracocentesis for recurrent right pleural effusion. Fluid was exudative in nature and LDH in the pleural fluid was elevated. Cytology was negative for malignancy. Patient also had peripheral flow cytometry done which was negative for malignancy. Pleural and peritoneal fluid cytology has been negative for malignancy/ lymphoma  2. Ultrasound of the abdomen on 01/24/2017 showed sludge in the gallbladder and parenchymal of the liver suggesting cirrhosis as well as moderate ascites. Initially his ascites with attribute it to his cirrhosis and nonalcoholic fatty liver disease. He was also treated for culture negative neutrophilic SBP based on asciticfluid analysis. Given that the ascites fluid was exudative in nature which is not consistent with cirrhosis from nonalcoholic fatty liver disease as well as no evidence of splenomegaly or abnormal LFTs, CT abdomen  was obtained to rule out any other etiology for ascites  3. CT abdomen on 03/21/2017 showed: IMPRESSION: 1. Bulky central mesenteric mass lesion encasing major mesenteric vascular anatomy without substantial mass-effect. Diffuse omental caking is evident and peritoneal enhancement is identified. There is associated mesenteric, juxta diaphragmatic, retroperitoneal, and retrocrural lymphadenopathy. Lymphoma would be a consideration. Metastatic disease could also have this appearance. 2. Small a moderate volume ascites  4. Patient denies any unintentional weight loss. His appetite is fair denies any drenching night sweats or recurrent fevers. Does report fatigue  5. PET CT showed: IMPRESSION: 1. Large hypermetabolic left eccentric mesenteric mass with extensive omental caking and peritoneal spread of tumor. This mechanism of spread would seem to favor a gastrointestinal primary, with entities such as carcinoid tumor or lymphoma as differential diagnostic considerations. Tissue diagnosis recommended. 2. Hypermetabolic mediastinal adenopathy compatible with malignancy. Moderate to large bilateral pleural effusions with passive atelectasis and low-grade activity along the pleural fluid-early malignant pleural effusions not excluded. 3. Aortic Atherosclerosis (ICD10-I70.0). 4. No definite involvement of the neck or skeleton. 5. Moderately enlarged prostate gland.  6. Omental biopsy showed: DIAGNOSIS:  A. SOFT TISSUE MASS, LEFT MESENTERY; CT-GUIDED CORE BIOPSY:  - LARGE B-CELL LYMPHOMA, CD10 POSITIVE  FISH testing for BCL 6 and cmycwas negative. Therefore he does not have a double hit lymphoma. Bone marrow biopsy was negative for lymphoma. Normal LDH. He did not meet criteria for cns prophylaxis. His only risk factors being age and stage IV  Interim PET/CT after 3 cycles should excellent response to treatment  Repeat PET/CT scan after 6 cycles of R CHOP showed:Grossly similar  size of small bowel mesenteric mass, somewhat difficult to measure secondary to its morphology. Slight increase in mild to moderate hypermetabolism. 2. No evidence  of extr abdominal hypermetabolic lymphoma. 3. Decrease in small right pleural effusion.  Interval history- he has trouble with his balance at times although denies frank tingling numbness in his extremities.  He is planning to follow keto diet to lose weight.  His appetite is good and he denies any unintentional weight loss or drenching night sweats.  Denies any lumps or bumps anywhere energy levels are fair  ECOG PS- 1 Pain scale- 0   Review of systems- Review of Systems  Constitutional: Negative for chills, fever, malaise/fatigue and weight loss.  HENT: Negative for congestion, ear discharge and nosebleeds.   Eyes: Negative for blurred vision.  Respiratory: Negative for cough, hemoptysis, sputum production, shortness of breath and wheezing.   Cardiovascular: Negative for chest pain, palpitations, orthopnea and claudication.  Gastrointestinal: Negative for abdominal pain, blood in stool, constipation, diarrhea, heartburn, melena, nausea and vomiting.  Genitourinary: Negative for dysuria, flank pain, frequency, hematuria and urgency.  Musculoskeletal: Negative for back pain, joint pain and myalgias.  Skin: Negative for rash.  Neurological: Negative for dizziness, tingling, focal weakness, seizures, weakness and headaches.       Imbalance while walking  Endo/Heme/Allergies: Does not bruise/bleed easily.  Psychiatric/Behavioral: Negative for depression and suicidal ideas. The patient does not have insomnia.       Allergies  Allergen Reactions  . Metformin Other (See Comments)    Stomach upset      Past Medical History:  Diagnosis Date  . Arthritis   . BPH (benign prostatic hyperplasia)   . Cancer ([place])    Non-Hodgkins lymphoma  . Chronic prostatitis   . Cirrhosis of liver ([place])   . Dyslipidemia   . Elevated PSA    . Gastric reflux   . Gout   . HTN (hypertension)   . Over weight   . Psoriasis   . Testicular pain, right   . Urinary frequency      Past Surgical History:  Procedure Laterality Date  . hydrocelectomy    . IR FLUORO GUIDE PORT INSERTION RIGHT  04/11/2017  . IR REMOVAL TUN ACCESS W/ PORT W/O FL MOD SED  05/29/2018  . PILONIDAL CYST EXCISION      Social History   Socioeconomic History  . Marital status: Married    Spouse name: Not on file  . Number of children: Not on file  . Years of education: Not on file  . Highest education level: Not on file  Occupational History  . Not on file  Social Needs  . Financial resource strain: Not on file  . Food insecurity:    Worry: Not on file    Inability: Not on file  . Transportation needs:    Medical: Not on file    Non-medical: Not on file  Tobacco Use  . Smoking status: Former Smoker    Packs/day: 1.50    Years: 8.00    Pack years: 12.00    Types: Cigarettes    Last attempt to quit: 05/29/1974    Years since quitting: 44.5  . Smokeless tobacco: Never Used  . Tobacco comment: quit 40 years ago  Substance and Sexual Activity  . Alcohol use: No    Alcohol/week: 0.0 standard drinks    Comment: rarely  . Drug use: No  . Sexual activity: Not on file  Lifestyle  . Physical activity:    Days per week: Not on file    Minutes per session: Not on file  . Stress: Not on file  Relationships  .  Social connections:    Talks on phone: Not on file    Gets together: Not on file    Attends religious service: Not on file    Active member of club or organization: Not on file    Attends meetings of clubs or organizations: Not on file    Relationship status: Not on file  . Intimate partner violence:    Fear of current or ex partner: Not on file    Emotionally abused: Not on file    Physically abused: Not on file    Forced sexual activity: Not on file  Other Topics Concern  . Not on file  Social History Narrative  . Not on file      Family History  Problem Relation Age of Onset  . Uterine cancer Mother   . Diabetes Mother   . Cancer Mother   . Cancer Maternal Uncle   . Bladder Cancer Neg Hx   . Kidney disease Neg Hx   . Prostate cancer Neg Hx      Current Outpatient Medications:  .  allopurinol (ZYLOPRIM) 300 MG tablet, Take 1 tablet (300 mg total) by mouth daily., Disp: 30 tablet, Rfl: 3 .  amLODipine (NORVASC) 10 MG tablet, TAKE 1 TABLET BY MOUTH DAILY, Disp: 30 tablet, Rfl: 11 .  aspirin EC 81 MG tablet, Take 81 mg by mouth daily. , Disp: , Rfl:  .  Cholecalciferol (VITAMIN D3) 1000 UNITS CAPS, Take 1,000 Units by mouth daily. , Disp: , Rfl:  .  finasteride (PROSCAR) 5 MG tablet, Take 1 tablet (5 mg total) by mouth daily., Disp: 90 tablet, Rfl: 0 .  losartan (COZAAR) 100 MG tablet, TAKE 1 TABLET DAILY, Disp: 90 tablet, Rfl: 3 .  naproxen (NAPROSYN) 500 MG tablet, Take 1 tablet (500 mg total) by mouth 2 (two) times daily as needed. (Patient not taking: Reported on 11/17/2018), Disp: 60 tablet, Rfl: 0 No current facility-administered medications for this visit.   Facility-Administered Medications Ordered in Other Visits:  .  sodium chloride flush (NS) 0.9 % injection 10 mL, 10 mL, Intravenous, PRN, Sindy Guadeloupe, MD, 10 mL at 05/12/18 1421  Physical exam:  Vitals:   11/17/18 1412  BP: (!) 174/91  Pulse: 86  Resp: 18  Temp: 98.3 F (36.8 C)  TempSrc: Tympanic  SpO2: 97%  Weight: 290 lb 1 oz (131.6 kg)   Physical Exam Constitutional:      General: He is not in acute distress.    Appearance: He is obese.  HENT:     Head: Normocephalic and atraumatic.  Eyes:     Pupils: Pupils are equal, round, and reactive to light.  Neck:     Musculoskeletal: Normal range of motion.  Cardiovascular:     Rate and Rhythm: Normal rate and regular rhythm.     Heart sounds: Normal heart sounds.  Pulmonary:     Effort: Pulmonary effort is normal.     Breath sounds: Normal breath sounds.  Abdominal:      General: Bowel sounds are normal. There is no distension.     Palpations: Abdomen is soft.     Tenderness: There is no abdominal tenderness.  Lymphadenopathy:     Comments: No palpable cervical, supraclavicular, axillary or inguinal adenopathy   Skin:    General: Skin is warm and dry.  Neurological:     Mental Status: He is alert and oriented to person, place, and time.     Sensory: No sensory deficit.  Coordination: Coordination normal.      CMP Latest Ref Rng & Units 11/17/2018  Glucose 70 - 99 mg/dL 226(H)  BUN 8 - 23 mg/dL 15  Creatinine 0.61 - 1.24 mg/dL 0.84  Sodium 135 - 145 mmol/L 139  Potassium 3.5 - 5.1 mmol/L 3.2(L)  Chloride 98 - 111 mmol/L 102  CO2 22 - 32 mmol/L 25  Calcium 8.9 - 10.3 mg/dL 9.1  Total Protein 6.5 - 8.1 g/dL 7.7  Total Bilirubin 0.3 - 1.2 mg/dL 0.5  Alkaline Phos 38 - 126 U/L 64  AST 15 - 41 U/L 21  ALT 0 - 44 U/L 21   CBC Latest Ref Rng & Units 11/17/2018  WBC 4.0 - 10.5 K/uL 7.5  Hemoglobin 13.0 - 17.0 g/dL 14.7  Hematocrit 39.0 - 52.0 % 42.1  Platelets 150 - 400 K/uL 218    No images are attached to the encounter.  Ct Chest W Contrast  Result Date: 11/12/2018 CLINICAL DATA:  Patient with history of lymphoma.  Follow-up exam. EXAM: CT CHEST, ABDOMEN, AND PELVIS WITH CONTRAST TECHNIQUE: Multidetector CT imaging of the chest, abdomen and pelvis was performed following the standard protocol during bolus administration of intravenous contrast. CONTRAST:  150m OMNIPAQUE IOHEXOL 300 MG/ML  SOLN COMPARISON:  PET-CT 05/08/2018; PET-CT 11/17/2017 FINDINGS: CT CHEST FINDINGS Cardiovascular: Normal heart size. Coronary arterial vascular calcifications. Aorta and main pulmonary artery normal in caliber. Thoracic aortic vascular calcifications. Mediastinum/Nodes: No enlarged axillary, mediastinal or hilar lymphadenopathy. Normal appearance of the esophagus. Lungs/Pleura: Central airways are patent. Dependent atelectasis within the left lower lobe. No  large area pulmonary consolidation. No pleural effusion or pneumothorax. Musculoskeletal: Thoracic spine degenerative changes. No aggressive or acute appearing osseous lesions. CT ABDOMEN PELVIS FINDINGS Hepatobiliary: The liver is diffusely low in attenuation compatible with hepatic steatosis. Sludge within the gallbladder lumen. No intrahepatic or extrahepatic biliary ductal dilatation. Pancreas: Unremarkable Spleen: Unremarkable Adrenals/Urinary Tract: Normal adrenal glands. 6 cm cyst interpolar region left kidney. Subcentimeter too small to characterize low-attenuation lesions right kidney. No hydronephrosis. Urinary bladder is decompressed. 2 mm stone mid pole right kidney. Stomach/Bowel: Sigmoid colonic diverticulosis. No CT evidence for acute diverticulitis. Normal morphology of the stomach. Vascular/Lymphatic: Normal caliber abdominal aorta. Peripheral calcified atherosclerotic plaque. No retroperitoneal lymphadenopathy. No pelvic adenopathy. Ill-defined soft tissue mass within the small bowel mesentery difficult to compare with prior given lack of IV contrast material. Overall this appears grossly similar to mildly decreased measuring 12 x 4.4 cm (image 82; series 2), previously 12 x 5.5 cm. Reproductive: Heterogeneous prostate. Other: None. Musculoskeletal: Lumbar and thoracic spine degenerative changes. No aggressive or acute appearing osseous lesions. IMPRESSION: 1. Similar to mild interval decrease in size of soft tissue mass within the small bowel mesentery left hemiabdomen. No new sites of disease. Electronically Signed   By: DLovey NewcomerM.D.   On: 11/12/2018 11:32   Ct Abdomen Pelvis W Contrast  Result Date: 11/12/2018 CLINICAL DATA:  Patient with history of lymphoma.  Follow-up exam. EXAM: CT CHEST, ABDOMEN, AND PELVIS WITH CONTRAST TECHNIQUE: Multidetector CT imaging of the chest, abdomen and pelvis was performed following the standard protocol during bolus administration of intravenous  contrast. CONTRAST:  1279mOMNIPAQUE IOHEXOL 300 MG/ML  SOLN COMPARISON:  PET-CT 05/08/2018; PET-CT 11/17/2017 FINDINGS: CT CHEST FINDINGS Cardiovascular: Normal heart size. Coronary arterial vascular calcifications. Aorta and main pulmonary artery normal in caliber. Thoracic aortic vascular calcifications. Mediastinum/Nodes: No enlarged axillary, mediastinal or hilar lymphadenopathy. Normal appearance of the esophagus. Lungs/Pleura: Central airways are patent. Dependent  atelectasis within the left lower lobe. No large area pulmonary consolidation. No pleural effusion or pneumothorax. Musculoskeletal: Thoracic spine degenerative changes. No aggressive or acute appearing osseous lesions. CT ABDOMEN PELVIS FINDINGS Hepatobiliary: The liver is diffusely low in attenuation compatible with hepatic steatosis. Sludge within the gallbladder lumen. No intrahepatic or extrahepatic biliary ductal dilatation. Pancreas: Unremarkable Spleen: Unremarkable Adrenals/Urinary Tract: Normal adrenal glands. 6 cm cyst interpolar region left kidney. Subcentimeter too small to characterize low-attenuation lesions right kidney. No hydronephrosis. Urinary bladder is decompressed. 2 mm stone mid pole right kidney. Stomach/Bowel: Sigmoid colonic diverticulosis. No CT evidence for acute diverticulitis. Normal morphology of the stomach. Vascular/Lymphatic: Normal caliber abdominal aorta. Peripheral calcified atherosclerotic plaque. No retroperitoneal lymphadenopathy. No pelvic adenopathy. Ill-defined soft tissue mass within the small bowel mesentery difficult to compare with prior given lack of IV contrast material. Overall this appears grossly similar to mildly decreased measuring 12 x 4.4 cm (image 82; series 2), previously 12 x 5.5 cm. Reproductive: Heterogeneous prostate. Other: None. Musculoskeletal: Lumbar and thoracic spine degenerative changes. No aggressive or acute appearing osseous lesions. IMPRESSION: 1. Similar to mild interval  decrease in size of soft tissue mass within the small bowel mesentery left hemiabdomen. No new sites of disease. Electronically Signed   By: Lovey Newcomer M.D.   On: 11/12/2018 11:32     Assessment and plan- Patient is a 68 y.o. male  h/o Stage IV DLBCL GCB, not double hit, low intermediaterisk IPIstatus post 6 cycles of R CHOP chemotherapy.    He is here to discuss results of CT scans and routine follow-up of DLBCL  Clinically he is doing well and I have reviewed CT chest abdomen and pelvis images independently and discussed findings with the patient as well.  He has a residual necrotic mesenteric mass which does not represent active malignancy.  There is no evidence of active lymphoma seen on scan.  I will see him back in 6 months time with CBC with differential, CMP and LDH and obtain his last CT chest abdomen pelvis prior which would allow him a 2 years since completion of treatment.  From that point onwards I will not be doing routine scans  Gait instability: Suspect this is secondary to neuropathy from chemotherapy.  I have advised him to focus on weight loss as well as exercise regimen to help with balance and gait training   Visit Diagnosis 1. Encounter for follow-up surveillance of diffuse large B-cell lymphoma   2. Chemotherapy-induced peripheral neuropathy ([place])      Dr. Randa Evens, MD, MPH [facility] [phone] 11/20/2018 7:17 AM

## 2018-12-08 ENCOUNTER — Ambulatory Visit: Payer: Medicare Other | Admitting: Urology

## 2019-01-11 ENCOUNTER — Other Ambulatory Visit: Payer: Self-pay | Admitting: Family Medicine

## 2019-01-11 DIAGNOSIS — I10 Essential (primary) hypertension: Secondary | ICD-10-CM

## 2019-02-18 ENCOUNTER — Other Ambulatory Visit: Payer: Self-pay | Admitting: Urology

## 2019-02-18 MED ORDER — FINASTERIDE 5 MG PO TABS: 5.0000 mg | ORAL_TABLET | Freq: Every day | ORAL | 0 refills | Status: DC

## 2019-02-18 NOTE — Progress Notes (Signed)
Finasteride prescription is [facility] to [facility].  He should have an appointment in July.

## 2019-04-05 ENCOUNTER — Other Ambulatory Visit: Payer: Medicare Other

## 2019-04-06 ENCOUNTER — Ambulatory Visit: Payer: Medicare Other | Admitting: Urology

## 2019-04-22 ENCOUNTER — Telehealth: Payer: Self-pay | Admitting: Urology

## 2019-04-22 ENCOUNTER — Other Ambulatory Visit: Payer: Self-pay | Admitting: Urology

## 2019-04-22 MED ORDER — FINASTERIDE 5 MG PO TABS: 5.0000 mg | ORAL_TABLET | Freq: Every day | ORAL | 0 refills | Status: DC

## 2019-04-22 NOTE — Progress Notes (Signed)
I have extended [facility] finasteride refills as he has several co morbidities and is reluctant to be seen in a medical office.  We will revisit this in three months.

## 2019-04-22 NOTE — Telephone Encounter (Signed)
Mr. Arp would like for Larene Beach to call him before his next scheduled appointment. Labs scheduled- 05/05/19 Office scheduled-05/07/19. Patient did not want to tell me the reason for the call only wants to talk to [facility].

## 2019-04-22 NOTE — Telephone Encounter (Signed)
Please cancel Mr. Shipes appointments in August.

## 2019-05-05 ENCOUNTER — Other Ambulatory Visit: Payer: Medicare Other

## 2019-05-07 ENCOUNTER — Ambulatory Visit: Payer: Medicare Other | Admitting: Urology

## 2019-05-12 ENCOUNTER — Telehealth: Payer: Self-pay | Admitting: *Deleted

## 2019-05-12 NOTE — Telephone Encounter (Signed)
Patient called to cancel the sched 8/18 lab and 8/20 MD visit. He stated that he would call back to R/S at a later date.

## 2019-05-18 ENCOUNTER — Other Ambulatory Visit: Payer: Medicare Other

## 2019-05-18 ENCOUNTER — Ambulatory Visit: Payer: Medicare Other

## 2019-05-20 ENCOUNTER — Ambulatory Visit: Payer: Medicare Other | Admitting: Oncology

## 2019-06-15 DIAGNOSIS — Z23 Encounter for immunization: Secondary | ICD-10-CM | POA: Diagnosis not present

## 2019-07-19 ENCOUNTER — Other Ambulatory Visit: Payer: Self-pay | Admitting: Family Medicine

## 2019-07-19 DIAGNOSIS — C8333 Diffuse large B-cell lymphoma, intra-abdominal lymph nodes: Secondary | ICD-10-CM

## 2019-08-23 ENCOUNTER — Telehealth: Payer: Self-pay | Admitting: Urology

## 2019-08-23 NOTE — Telephone Encounter (Signed)
LMOM and notified patient per Larene Beach he needs to have a follow up appointment with a PSA prior he cancelled his appointment in February before the Finasteride can be ordered.

## 2019-08-23 NOTE — Telephone Encounter (Signed)
Pt states that he doesn't want to come into office at this time for follow up due to the rise in [place].

## 2019-08-23 NOTE — Telephone Encounter (Signed)
Pt requests a refill for Finasteride.

## 2019-08-24 ENCOUNTER — Other Ambulatory Visit: Payer: Self-pay

## 2019-08-24 ENCOUNTER — Other Ambulatory Visit: Payer: Medicare Other

## 2019-08-24 ENCOUNTER — Other Ambulatory Visit: Payer: Self-pay | Admitting: *Deleted

## 2019-08-24 DIAGNOSIS — Z87898 Personal history of other specified conditions: Secondary | ICD-10-CM

## 2019-08-24 DIAGNOSIS — N138 Other obstructive and reflux uropathy: Secondary | ICD-10-CM | POA: Diagnosis not present

## 2019-08-24 DIAGNOSIS — N401 Enlarged prostate with lower urinary tract symptoms: Secondary | ICD-10-CM | POA: Diagnosis not present

## 2019-08-24 NOTE — Progress Notes (Signed)
08/25/2019 9:02 PM   Peter Ryan April 16, 1951 [id number]  Referring provider: [name]., MD [address] [place],  [place] [address]  Chief Complaint  Patient presents with  . Follow-up    HPI: 68 year old male with a history of large B-cell lymphoma with BPH with lower urinary tract symptoms and an elevated PSA who presents today to reestablish care.    BPH with LUTS I PSS score is 2/1.  Previous I PSS score was 3/1.  He is taking finasteride 5 mg daily.   He has no complaints at this time.  Patient denies any gross hematuria, dysuria or suprapubic/flank pain.  Patient denies any fevers, chills, nausea or vomiting.  He does not have a family history of prostate cancer. IPSS    Row Name 08/25/19 0800         International Prostate Symptom Score   How often have you had the sensation of not emptying your bladder?  Not at All     How often have you had to urinate less than every two hours?  Less than 1 in 5 times     How often have you found you stopped and started again several times when you urinated?  Not at All     How often have you found it difficult to postpone urination?  Not at All     How often have you had a weak urinary stream?  Not at All     How often have you had to strain to start urination?  Not at All     How many times did you typically get up at night to urinate?  1 Time     Total IPSS Score  2       Quality of Life due to urinary symptoms   If you were to spend the rest of your life with your urinary condition just the way it is now how would you feel about that?  Pleased        Score:  1-7 Mild 8-19 Moderate 20-35 Severe  History of elevated PSA 04/2014   6.38  Component     Latest Ref Rng & Units 05/16/2015 07/18/2016 01/21/2018 08/24/2019  Prostate Specific Ag, Serum     0.0 - 4.0 ng/mL 3.8 5.3 (H) 1.3 1.6   Patient was found to have an elevated PSA of 7.5 ng/mL in 05/2014.  He did have an infection at that time.     PMH: Past Medical History:  Diagnosis Date  . Arthritis   . BPH (benign prostatic hyperplasia)   . Cancer ([place])    Non-Hodgkins lymphoma  . Chronic prostatitis   . Cirrhosis of liver ([place])   . Dyslipidemia   . Elevated PSA   . Gastric reflux   . Gout   . HTN (hypertension)   . Over weight   . Psoriasis   . Testicular pain, right   . Urinary frequency     Surgical History: Past Surgical History:  Procedure Laterality Date  . hydrocelectomy    . IR FLUORO GUIDE PORT INSERTION RIGHT  04/11/2017  . IR REMOVAL TUN ACCESS W/ PORT W/O FL MOD SED  05/29/2018  . PILONIDAL CYST EXCISION      Home Medications:  Allergies as of 08/25/2019      Reactions   Metformin Other (See Comments)   Stomach upset      Medication List       Accurate as of August 25, 2019  9:02 PM. If you have any questions, ask your nurse or doctor.        allopurinol 300 MG tablet Commonly known as: ZYLOPRIM TAKE ONE TABLET EVERY DAY   amLODipine 10 MG tablet Commonly known as: NORVASC TAKE ONE TABLET EVERY DAY   aspirin EC 81 MG tablet Take 81 mg by mouth daily.   finasteride 5 MG tablet Commonly known as: Proscar Take 1 tablet (5 mg total) by mouth daily.   losartan 100 MG tablet Commonly known as: COZAAR TAKE 1 TABLET DAILY   naproxen 500 MG tablet Commonly known as: Naprosyn Take 1 tablet (500 mg total) by mouth 2 (two) times daily as needed.   Vitamin D3 25 MCG (1000 UT) Caps Take 1,000 Units by mouth daily.       Allergies:  Allergies  Allergen Reactions  . Metformin Other (See Comments)    Stomach upset     Family History: Family History  Problem Relation Age of Onset  . Uterine cancer Mother   . Diabetes Mother   . Cancer Mother   . Cancer Maternal Uncle   . Bladder Cancer Neg Hx   . Kidney disease Neg Hx   . Prostate cancer Neg Hx     Social History:  reports that he quit smoking about 45 years ago. His smoking use included cigarettes. He has a 12.00  pack-year smoking history. He has never used smokeless tobacco. He reports that he does not drink alcohol or use drugs.  ROS: UROLOGY Frequent Urination?: No Hard to postpone urination?: No Burning/pain with urination?: No Get up at night to urinate?: No Leakage of urine?: No Urine stream starts and stops?: No Trouble starting stream?: No Do you have to strain to urinate?: No Blood in urine?: No Urinary tract infection?: No Sexually transmitted disease?: No Injury to kidneys or bladder?: No Painful intercourse?: No Weak stream?: No Erection problems?: No Penile pain?: No  Gastrointestinal Nausea?: No Vomiting?: No Indigestion/heartburn?: No Diarrhea?: No Constipation?: No  Constitutional Fever: No Night sweats?: No Weight loss?: No Fatigue?: No  Skin Skin rash/lesions?: No Itching?: No  Eyes Blurred vision?: No Double vision?: No  Ears/Nose/Throat Sore throat?: No Sinus problems?: No  Hematologic/Lymphatic Swollen glands?: No Easy bruising?: No  Cardiovascular Leg swelling?: No Chest pain?: No  Respiratory Cough?: No Shortness of breath?: No  Endocrine Excessive thirst?: No  Musculoskeletal Back pain?: No Joint pain?: No  Neurological Headaches?: No Dizziness?: No  Psychologic Depression?: No Anxiety?: No  Physical Exam: BP (!) 148/74   Pulse 76   Ht 5\' 10"  (1.778 m)   Wt 280 lb (127 kg)   BMI 40.18 kg/m   Constitutional:  Well nourished. Alert and oriented, No acute distress. HEENT: [place] AT, moist mucus membranes.  Trachea midline, no masses. Cardiovascular: No clubbing, cyanosis, or edema. Respiratory: Normal respiratory effort, no increased work of breathing. GI: Abdomen is soft, non tender, non distended, no abdominal masses. Liver and spleen not palpable.  No hernias appreciated.  Stool sample for occult testing is not indicated.   GU: No CVA tenderness.  No bladder fullness or masses.  Patient with uncircumcised phallus.   Foreskin could only be retracted to reveal 1 cm aperture.  Urethral meatus is patent.  No penile discharge. No penile lesions or rashes. Scrotum without lesions, cysts, rashes and/or edema.  Testicles are located scrotally bilaterally. No masses are appreciated in the testicles. Left and right epididymis are normal. Rectal: Patient with  normal sphincter  tone. Anus and perineum without scarring or rashes. No rectal masses are appreciated. Prostate could not be palpated due to body habitus.   Skin: No rashes, bruises or suspicious lesions. Lymph: No inguinal adenopathy. Neurologic: Grossly intact, no focal deficits, moving all 4 extremities. Psychiatric: Normal mood and affect.  Laboratory Data: Lab Results  Component Value Date   WBC 7.5 11/17/2018   HGB 14.7 11/17/2018   HCT 42.1 11/17/2018   MCV 84.7 11/17/2018   PLT 218 11/17/2018    Lab Results  Component Value Date   CREATININE 0.84 11/17/2018    No results found for: PSA  No results found for: TESTOSTERONE  Lab Results  Component Value Date   HGBA1C 8.2 (H) 07/31/2018    Lab Results  Component Value Date   TSH 1.150 01/21/2018       Component Value Date/Time   CHOL 180 07/31/2018 0854   HDL 30 (L) 07/31/2018 0854   CHOLHDL 6.0 (H) 07/31/2018 0854   LDLCALC Comment 07/31/2018 0854    Lab Results  Component Value Date   AST 21 11/17/2018   Lab Results  Component Value Date   ALT 21 11/17/2018   No components found for: ALKALINEPHOPHATASE No components found for: BILIRUBINTOTAL  No results found for: ESTRADIOL  Urinalysis    Component Value Date/Time   APPEARANCEUR Clear 07/18/2016 0927   GLUCOSEU Negative 07/18/2016 0927   BILIRUBINUR Negative 07/18/2016 0927   PROTEINUR 1+ (A) 07/18/2016 0927   UROBILINOGEN 0.2 06/14/2016 1419   NITRITE Negative 07/18/2016 0927   LEUKOCYTESUR Negative 07/18/2016 0927    I have reviewed the labs.   Pertinent Imaging: n/a   Assessment & Plan:    1. BPH  with LUTS IPSS score is 2/1, it is improving Continue conservative management, avoiding bladder irritants and timed voiding's Continue finasteride 5 mg daily: refills given RTC in 12 months for PSA, I PSS and exam  2. History of elevated PSA Patient was initially found to have an elevated PSA of 7.5 in September 2015 - likely due to infection Most recent PSA is 1.6 (3.2)  RTC in 6 months for PSA only    Return in about 6 months (around 02/22/2020) for PSA only .  These notes generated with voice recognition software. I apologize for typographical errors.  Zara Council, PA-C  [facility] [address] [place], [place] [address] [phone]

## 2019-08-25 ENCOUNTER — Encounter: Payer: Self-pay | Admitting: Urology

## 2019-08-25 ENCOUNTER — Ambulatory Visit (INDEPENDENT_AMBULATORY_CARE_PROVIDER_SITE_OTHER): Payer: Medicare Other | Admitting: Urology

## 2019-08-25 VITALS — BP 148/74 | HR 76 | Ht 70.0 in | Wt 280.0 lb

## 2019-08-25 DIAGNOSIS — N138 Other obstructive and reflux uropathy: Secondary | ICD-10-CM

## 2019-08-25 DIAGNOSIS — Z87898 Personal history of other specified conditions: Secondary | ICD-10-CM

## 2019-08-25 DIAGNOSIS — N401 Enlarged prostate with lower urinary tract symptoms: Secondary | ICD-10-CM | POA: Diagnosis not present

## 2019-08-25 LAB — PSA: Prostate Specific Ag, Serum: 1.6 ng/mL (ref 0.0–4.0)

## 2019-08-25 MED ORDER — FINASTERIDE 5 MG PO TABS: 5.0000 mg | ORAL_TABLET | Freq: Every day | ORAL | 3 refills | Status: DC

## 2019-11-03 DIAGNOSIS — L821 Other seborrheic keratosis: Secondary | ICD-10-CM | POA: Diagnosis not present

## 2019-11-03 DIAGNOSIS — L570 Actinic keratosis: Secondary | ICD-10-CM | POA: Diagnosis not present

## 2019-11-03 DIAGNOSIS — L578 Other skin changes due to chronic exposure to nonionizing radiation: Secondary | ICD-10-CM | POA: Diagnosis not present

## 2019-11-03 DIAGNOSIS — L820 Inflamed seborrheic keratosis: Secondary | ICD-10-CM | POA: Diagnosis not present

## 2019-11-18 NOTE — Progress Notes (Signed)
Patient: Peter Ryan Male    DOB: [birth year]   69 y.o.   MRN: [id number] Visit Date: 11/22/2019  Today's Provider: Wilhemena Durie, MD   Chief Complaint  Patient presents with  . Follow-up  . Diabetes  . Hypertension  . Hyperlipidemia   Subjective:     HPI  Overall the patient is feeling well.  His lymphoma appears to be in remission.  He states he is finally ready to make some lifestyle changes.  He complains of problems with weakness in his legs have numbness and tingling in his feet and lower extremities.  He states due to these issues he has trouble walking recently. Type 2 diabetes mellitus with complication, with long-term current use of insulin ([place]) From 07/23/2018-Hemoglobin A1c 8.2. Lifestyle is stressed to patient.  He knows he needs to lose weight.  Home blood sugars averaging 165.   Benign essential hypertension From 07/23/2018-labs checked showing-Diabetes and lipids both high. Advised to work hard on diet and exercise/lifestyle intervention.   Home blood pressure checked occasionally averaging 140/75.  Dyslipidemia From 07/23/2018-labs checked showing-Diabetes and lipids both high. Advised to work hard on diet and exercise/lifestyle intervention.  Gout, unspecified cause, unspecified chronicity, unspecified site From 07/23/2018-labs checked showing-Diabetes and lipids both high. Advised to work hard on diet and exercise/lifestyle intervention.To have uric acid under 6.  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 ([place]) From 07/23/2018-Followed by hematology.  Patient is asymptomatic.  Overall feels pretty well.  Allergies  Allergen Reactions  . Metformin Other (See Comments)    Stomach upset      Current Outpatient Medications:  .  allopurinol (ZYLOPRIM) 300 MG tablet, TAKE ONE TABLET EVERY DAY, Disp: 30 tablet, Rfl: 3 .  amLODipine (NORVASC) 10 MG tablet, TAKE ONE TABLET EVERY DAY, Disp: 30 tablet, Rfl: 11 .  aspirin  EC 81 MG tablet, Take 81 mg by mouth daily. , Disp: , Rfl:  .  Cholecalciferol (VITAMIN D3) 1000 UNITS CAPS, Take 1,000 Units by mouth daily. , Disp: , Rfl:  .  finasteride (PROSCAR) 5 MG tablet, Take 1 tablet (5 mg total) by mouth daily., Disp: 90 tablet, Rfl: 3 .  losartan (COZAAR) 100 MG tablet, TAKE 1 TABLET DAILY, Disp: 90 tablet, Rfl: 3 .  naproxen (NAPROSYN) 500 MG tablet, Take 1 tablet (500 mg total) by mouth 2 (two) times daily as needed. (Patient not taking: Reported on 11/22/2019), Disp: 60 tablet, Rfl: 0 No current facility-administered medications for this visit.  Facility-Administered Medications Ordered in Other Visits:  .  sodium chloride flush (NS) 0.9 % injection 10 mL, 10 mL, Intravenous, PRN, Sindy Guadeloupe, MD, 10 mL at 05/12/18 1421  Review of Systems  Constitutional: Negative for appetite change, chills and fever.  HENT: Negative.   Eyes: Negative.   Respiratory: Negative for chest tightness, shortness of breath and wheezing.   Cardiovascular: Negative for chest pain and palpitations.  Gastrointestinal: Negative for abdominal pain, nausea and vomiting.  Endocrine: Negative.   Musculoskeletal: Positive for arthralgias and gait problem.  Allergic/Immunologic: Negative.   Neurological: Positive for weakness and numbness.    Social History   Tobacco Use  . Smoking status: Former Smoker    Packs/day: 1.50    Years: 8.00    Pack years: 12.00    Types: Cigarettes    Quit date: 05/29/1974    Years since quitting: 45.5  . Smokeless tobacco: Never Used  . Tobacco comment: quit 40 years ago  Substance Use Topics  . Alcohol use: No    Alcohol/week: 0.0 standard drinks    Comment: rarely      Objective:   BP 129/80 (BP Location: Right Arm, Patient Position: Sitting, Cuff Size: Large)   Pulse 85   Temp (!) 97.5 F (36.4 C) (Other (Comment))   Resp 18   Ht 5\' 10"  (1.778 m)   Wt 279 lb (126.6 kg)   SpO2 97%   BMI 40.03 kg/m  Vitals:   11/22/19 1129  BP:  129/80  Pulse: 85  Resp: 18  Temp: (!) 97.5 F (36.4 C)  TempSrc: Other (Comment)  SpO2: 97%  Weight: 279 lb (126.6 kg)  Height: 5\' 10"  (1.778 m)  Body mass index is 40.03 kg/m.   Physical Exam Vitals reviewed.  Constitutional:      Appearance: He is well-developed. He is obese.  HENT:     Head: Normocephalic and atraumatic.  Eyes:     General: No scleral icterus.    Conjunctiva/sclera: Conjunctivae normal.  Neck:     Thyroid: No thyromegaly.  Cardiovascular:     Rate and Rhythm: Normal rate and regular rhythm.     Heart sounds: Normal heart sounds.  Pulmonary:     Effort: Pulmonary effort is normal.     Breath sounds: Normal breath sounds.  Abdominal:     Palpations: Abdomen is soft.  Musculoskeletal:     Comments: Trace edema.  Skin:    General: Skin is warm and dry.  Neurological:     Mental Status: He is alert and oriented to person, place, and time.     Comments: On monofilament exam of the feet he has decreased sensation in the distal portion of the feet to thick monofilament.  He is able to move all his extremities appears to have 5/5 strength but does have a wide-based shuffling gait.  Psychiatric:        Mood and Affect: Mood normal.        Behavior: Behavior normal.        Thought Content: Thought content normal.        Judgment: Judgment normal.      No results found for any visits on 11/22/19.     Assessment & Plan    1. Type 2 diabetes mellitus with complication, with long-term current use of insulin (H[facility]) Lifestyle as discussed at length for diet and exercise.  Patient states he is finally ready to make changes. - Hemoglobin A1C - Lipid panel - CBC w/Diff/Platelet - Comprehensive Metabolic Panel (CMET) - CK (Creatine Kinase) - Vitamin B12  2. Benign essential hypertension Controlled on amlodipine and losartan - Hemoglobin A1C - Lipid panel - CBC w/Diff/Platelet - Comprehensive Metabolic Panel (CMET) - CK (Creatine Kinase) - Vitamin  B12  3. Dyslipidemia Check lipids.  Patient states he has been statin intolerant in the past.  We will try again with Crestor. - Hemoglobin A1C - Lipid panel - CBC w/Diff/Platelet - Comprehensive Metabolic Panel (CMET) - CK (Creatine Kinase) - Vitamin B12  4. Myopathy He complains of leg weakness.  Check CK. - Hemoglobin A1C - Lipid panel - CBC w/Diff/Platelet - Comprehensive Metabolic Panel (CMET) - CK (Creatine Kinase) - Vitamin B12 - Ambulatory referral to Neurology  5. Tingling  - Vitamin B12  6. Gait disturbance Refer to neurology. - Vitamin B12 - Ambulatory referral to Neurology  7. 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 ([place])   8. Obesity, morbid (more than 100 lbs  over ideal weight or BMI > 40) (HCC) Ager issue for this patient with a BMI of 40.  Style changes with slow and steady weight loss is encouraged.     Richard Cranford Mon, MD  [facility]

## 2019-11-22 ENCOUNTER — Other Ambulatory Visit: Payer: Self-pay

## 2019-11-22 ENCOUNTER — Encounter: Payer: Self-pay | Admitting: Family Medicine

## 2019-11-22 ENCOUNTER — Ambulatory Visit (INDEPENDENT_AMBULATORY_CARE_PROVIDER_SITE_OTHER): Payer: Medicare Other | Admitting: Family Medicine

## 2019-11-22 ENCOUNTER — Other Ambulatory Visit: Payer: Self-pay | Admitting: Family Medicine

## 2019-11-22 VITALS — BP 129/80 | HR 85 | Temp 97.5°F | Resp 18 | Ht 70.0 in | Wt 279.0 lb

## 2019-11-22 DIAGNOSIS — C8333 Diffuse large B-cell lymphoma, intra-abdominal lymph nodes: Secondary | ICD-10-CM | POA: Diagnosis not present

## 2019-11-22 DIAGNOSIS — R269 Unspecified abnormalities of gait and mobility: Secondary | ICD-10-CM

## 2019-11-22 DIAGNOSIS — I10 Essential (primary) hypertension: Secondary | ICD-10-CM

## 2019-11-22 DIAGNOSIS — R202 Paresthesia of skin: Secondary | ICD-10-CM

## 2019-11-22 DIAGNOSIS — Z794 Long term (current) use of insulin: Secondary | ICD-10-CM

## 2019-11-22 DIAGNOSIS — E118 Type 2 diabetes mellitus with unspecified complications: Secondary | ICD-10-CM | POA: Diagnosis not present

## 2019-11-22 DIAGNOSIS — G729 Myopathy, unspecified: Secondary | ICD-10-CM | POA: Diagnosis not present

## 2019-11-22 DIAGNOSIS — E785 Hyperlipidemia, unspecified: Secondary | ICD-10-CM | POA: Diagnosis not present

## 2019-11-22 NOTE — Telephone Encounter (Signed)
Requested medication (s) are due for refill today: yes  Requested medication (s) are on the active medication list:yes  Last refill:  07/19/19  Future visit scheduled: Pt in today  Notes to clinic:  Labs out of date. New labs active.    Requested Prescriptions  Pending Prescriptions Disp Refills   allopurinol (ZYLOPRIM) 300 MG tablet [Pharmacy Med Name: ALLOPURINOL 300 MG TAB] 30 tablet 3    Sig: TAKE ONE TABLET EVERY DAY      Endocrinology:  Gout Agents Failed - 11/22/2019 12:55 PM      Failed - Uric Acid in normal range and within 360 days    Uric Acid  Date Value Ref Range Status  07/31/2018 4.5 3.7 - 8.6 mg/dL Final    Comment:               Therapeutic target for gout patients: <6.0          Failed - Cr in normal range and within 360 days    Creatinine, Ser  Date Value Ref Range Status  11/17/2018 0.84 0.61 - 1.24 mg/dL Final          Passed - Valid encounter within last 12 months    Recent Outpatient Visits           Today Type 2 diabetes mellitus with complication, with long-term current use of insulin [facility])   [facility] [name]., MD   1 year ago Plantar fasciitis   [facility] [name]., MD   1 year ago Type 2 diabetes mellitus with complication, with long-term current use of insulin [facility])   [facility] [name]., MD   1 year ago Benign essential hypertension   [facility] [name]., MD   1 year ago Benign essential hypertension   [facility] [name]., MD

## 2019-11-23 LAB — COMPREHENSIVE METABOLIC PANEL
ALT: 26 IU/L (ref 0–44)
AST: 19 IU/L (ref 0–40)
Albumin/Globulin Ratio: 1.7 (ref 1.2–2.2)
Albumin: 4.7 g/dL (ref 3.8–4.8)
Alkaline Phosphatase: 77 IU/L (ref 39–117)
BUN/Creatinine Ratio: 13 (ref 10–24)
BUN: 12 mg/dL (ref 8–27)
Bilirubin Total: 0.4 mg/dL (ref 0.0–1.2)
CO2: 21 mmol/L (ref 20–29)
Calcium: 9.7 mg/dL (ref 8.6–10.2)
Chloride: 99 mmol/L (ref 96–106)
Creatinine, Ser: 0.94 mg/dL (ref 0.76–1.27)
GFR calc Af Amer: 97 mL/min/{1.73_m2} (ref >59)
GFR calc non Af Amer: 84 mL/min/{1.73_m2} (ref >59)
Globulin, Total: 2.7 g/dL (ref 1.5–4.5)
Glucose: 171 mg/dL — ABNORMAL HIGH (ref 65–99)
Potassium: 3.6 mmol/L (ref 3.5–5.2)
Sodium: 141 mmol/L (ref 134–144)
Total Protein: 7.4 g/dL (ref 6.0–8.5)

## 2019-11-23 LAB — CBC WITH DIFFERENTIAL/PLATELET
Basophils Absolute: 0.1 10*3/uL (ref 0.0–0.2)
Basos: 1 %
EOS (ABSOLUTE): 0.1 10*3/uL (ref 0.0–0.4)
Eos: 1 %
Hematocrit: 48.4 % (ref 37.5–51.0)
Hemoglobin: 16.2 g/dL (ref 13.0–17.7)
Immature Grans (Abs): 0 10*3/uL (ref 0.0–0.1)
Immature Granulocytes: 0 %
Lymphocytes Absolute: 1.7 10*3/uL (ref 0.7–3.1)
Lymphs: 23 %
MCH: 28.5 pg (ref 26.6–33.0)
MCHC: 33.5 g/dL (ref 31.5–35.7)
MCV: 85 fL (ref 79–97)
Monocytes Absolute: 0.6 10*3/uL (ref 0.1–0.9)
Monocytes: 8 %
Neutrophils Absolute: 5 10*3/uL (ref 1.4–7.0)
Neutrophils: 67 %
Platelets: 291 10*3/uL (ref 150–450)
RBC: 5.68 x10E6/uL (ref 4.14–5.80)
RDW: 13.2 % (ref 11.6–15.4)
WBC: 7.5 10*3/uL (ref 3.4–10.8)

## 2019-11-23 LAB — HEMOGLOBIN A1C
Est. average glucose Bld gHb Est-mCnc: 194 mg/dL
Hgb A1c MFr Bld: 8.4 % — ABNORMAL HIGH (ref 4.8–5.6)

## 2019-11-23 LAB — LIPID PANEL
Chol/HDL Ratio: 7.1 ratio — ABNORMAL HIGH (ref 0.0–5.0)
Cholesterol, Total: 219 mg/dL — ABNORMAL HIGH (ref 100–199)
HDL: 31 mg/dL — ABNORMAL LOW (ref >39)
LDL Chol Calc (NIH): 129 mg/dL — ABNORMAL HIGH (ref 0–99)
Triglycerides: 328 mg/dL — ABNORMAL HIGH (ref 0–149)
VLDL Cholesterol Cal: 59 mg/dL — ABNORMAL HIGH (ref 5–40)

## 2019-11-23 LAB — CK: Total CK: 62 U/L (ref 41–331)

## 2019-11-23 LAB — VITAMIN B12: Vitamin B-12: 414 pg/mL (ref 232–1245)

## 2019-11-29 ENCOUNTER — Telehealth: Payer: Self-pay

## 2019-11-29 DIAGNOSIS — E785 Hyperlipidemia, unspecified: Secondary | ICD-10-CM

## 2019-11-29 MED ORDER — ROSUVASTATIN CALCIUM 10 MG PO TABS: 10.0000 mg | ORAL_TABLET | Freq: Every day | ORAL | 0 refills | Status: DC

## 2019-11-29 NOTE — Telephone Encounter (Signed)
-----   Message from [name]., MD sent at 11/29/2019 12:45 PM EST ----- Diabetes and lipids not well controlled.  Work on diet and exercise as discussed at the time of the visit.  Start rosuvastatin 10 mg daily.  Return to clinic 1 month.

## 2019-11-29 NOTE — Telephone Encounter (Signed)
Patient advised and scheduled for follow up appointment.

## 2019-12-01 DIAGNOSIS — L578 Other skin changes due to chronic exposure to nonionizing radiation: Secondary | ICD-10-CM | POA: Diagnosis not present

## 2019-12-08 DIAGNOSIS — Z23 Encounter for immunization: Secondary | ICD-10-CM | POA: Diagnosis not present

## 2019-12-23 ENCOUNTER — Telehealth: Payer: Self-pay | Admitting: *Deleted

## 2019-12-23 ENCOUNTER — Telehealth: Payer: Self-pay | Admitting: Oncology

## 2019-12-23 ENCOUNTER — Other Ambulatory Visit: Payer: Self-pay | Admitting: *Deleted

## 2019-12-23 DIAGNOSIS — C8333 Diffuse large B-cell lymphoma, intra-abdominal lymph nodes: Secondary | ICD-10-CM

## 2019-12-23 DIAGNOSIS — G620 Drug-induced polyneuropathy: Secondary | ICD-10-CM

## 2019-12-23 NOTE — Telephone Encounter (Signed)
CT did not require authorization and was scheduled for 01-03-20. Lab and see MD are scheduled for 01-04-20. Writer phoned patient on this date and left voicemail with appts and information regarding the CT.

## 2019-12-23 NOTE — Telephone Encounter (Signed)
Patient called asking if he can be set up fr a scan to be done. He cancelled his August appointment and never call back to reschedule it. Please advise

## 2019-12-23 NOTE — Telephone Encounter (Signed)
Dr. Janese Banks is not sure if ct scna would be covered. His dx ws 2 years ago but we will try. I have entered a ct scan and labs. Please call pt with dates of scan if approved and then 1-2 days later to have labs and see md for scan results. Thanks

## 2019-12-23 NOTE — Telephone Encounter (Signed)
Please see my last note.  It has been more than 2 years since his lymphoma diagnosis and I am not sure if CT chest abdomen and pelvis with contrast will be covered by his insurance but we can try scheduling it.  I will see him after his CT scan is done and he will need labs CBC with differential CMP and LDH on that day.  This will be an in person visit

## 2019-12-30 ENCOUNTER — Other Ambulatory Visit: Payer: Self-pay | Admitting: Family Medicine

## 2019-12-30 DIAGNOSIS — I10 Essential (primary) hypertension: Secondary | ICD-10-CM

## 2020-01-03 ENCOUNTER — Ambulatory Visit
Admission: RE | Admit: 2020-01-03 | Discharge: 2020-01-03 | Disposition: A | Discharge status: Home / Self Care | Payer: Medicare Other | Source: Ambulatory Visit | Attending: Oncology | Admitting: Oncology

## 2020-01-03 ENCOUNTER — Other Ambulatory Visit: Payer: Self-pay

## 2020-01-03 DIAGNOSIS — C8330 Diffuse large B-cell lymphoma, unspecified site: Secondary | ICD-10-CM | POA: Diagnosis not present

## 2020-01-03 DIAGNOSIS — C8333 Diffuse large B-cell lymphoma, intra-abdominal lymph nodes: Secondary | ICD-10-CM | POA: Insufficient documentation

## 2020-01-03 MED ORDER — IOHEXOL 300 MG/ML  SOLN
100.0000 mL | Freq: Once | INTRAMUSCULAR | Status: AC | PRN
  Administered 2020-01-03: 100 mL via INTRAVENOUS

## 2020-01-04 ENCOUNTER — Encounter: Payer: Self-pay | Admitting: Oncology

## 2020-01-04 ENCOUNTER — Inpatient Hospital Stay: Payer: Medicare Other | Attending: Oncology

## 2020-01-04 ENCOUNTER — Other Ambulatory Visit: Payer: Self-pay | Admitting: Family Medicine

## 2020-01-04 ENCOUNTER — Inpatient Hospital Stay (HOSPITAL_BASED_OUTPATIENT_CLINIC_OR_DEPARTMENT_OTHER): Payer: Medicare Other | Admitting: Oncology

## 2020-01-04 VITALS — BP 139/77 | HR 79 | Temp 96.6°F | Resp 16 | Wt 275.3 lb

## 2020-01-04 DIAGNOSIS — T451X5A Adverse effect of antineoplastic and immunosuppressive drugs, initial encounter: Secondary | ICD-10-CM | POA: Diagnosis not present

## 2020-01-04 DIAGNOSIS — C8333 Diffuse large B-cell lymphoma, intra-abdominal lymph nodes: Secondary | ICD-10-CM

## 2020-01-04 DIAGNOSIS — Z08 Encounter for follow-up examination after completed treatment for malignant neoplasm: Secondary | ICD-10-CM | POA: Diagnosis not present

## 2020-01-04 DIAGNOSIS — G620 Drug-induced polyneuropathy: Secondary | ICD-10-CM | POA: Diagnosis not present

## 2020-01-04 DIAGNOSIS — Z8572 Personal history of non-Hodgkin lymphomas: Secondary | ICD-10-CM | POA: Diagnosis not present

## 2020-01-04 DIAGNOSIS — Z8579 Personal history of other malignant neoplasms of lymphoid, hematopoietic and related tissues: Secondary | ICD-10-CM

## 2020-01-04 DIAGNOSIS — I10 Essential (primary) hypertension: Secondary | ICD-10-CM

## 2020-01-04 LAB — CBC WITH DIFFERENTIAL/PLATELET
Abs Immature Granulocytes: 0.03 10*3/uL (ref 0.00–0.07)
Basophils Absolute: 0.1 10*3/uL (ref 0.0–0.1)
Basophils Relative: 1 %
Eosinophils Absolute: 0.2 10*3/uL (ref 0.0–0.5)
Eosinophils Relative: 2 %
HCT: 48.5 % (ref 39.0–52.0)
Hemoglobin: 16 g/dL (ref 13.0–17.0)
Immature Granulocytes: 0 %
Lymphocytes Relative: 25 %
Lymphs Abs: 1.8 10*3/uL (ref 0.7–4.0)
MCH: 29 pg (ref 26.0–34.0)
MCHC: 33 g/dL (ref 30.0–36.0)
MCV: 87.9 fL (ref 80.0–100.0)
Monocytes Absolute: 0.5 10*3/uL (ref 0.1–1.0)
Monocytes Relative: 7 %
Neutro Abs: 4.6 10*3/uL (ref 1.7–7.7)
Neutrophils Relative %: 65 %
Platelets: 217 10*3/uL (ref 150–400)
RBC: 5.52 MIL/uL (ref 4.22–5.81)
RDW: 13 % (ref 11.5–15.5)
WBC: 7.2 10*3/uL (ref 4.0–10.5)
nRBC: 0 % (ref 0.0–0.2)

## 2020-01-04 LAB — COMPREHENSIVE METABOLIC PANEL
ALT: 29 U/L (ref 0–44)
AST: 20 U/L (ref 15–41)
Albumin: 4.3 g/dL (ref 3.5–5.0)
Alkaline Phosphatase: 67 U/L (ref 38–126)
Anion gap: 13 (ref 5–15)
BUN: 15 mg/dL (ref 8–23)
CO2: 28 mmol/L (ref 22–32)
Calcium: 9.3 mg/dL (ref 8.9–10.3)
Chloride: 103 mmol/L (ref 98–111)
Creatinine, Ser: 0.97 mg/dL (ref 0.61–1.24)
GFR calc Af Amer: >60 mL/min (ref >60)
GFR calc non Af Amer: >60 mL/min (ref >60)
Glucose, Bld: 178 mg/dL — ABNORMAL HIGH (ref 70–99)
Potassium: 4.4 mmol/L (ref 3.5–5.1)
Sodium: 144 mmol/L (ref 135–145)
Total Bilirubin: 0.7 mg/dL (ref 0.3–1.2)
Total Protein: 8.1 g/dL (ref 6.5–8.1)

## 2020-01-04 LAB — LACTATE DEHYDROGENASE: LDH: 107 U/L (ref 98–192)

## 2020-01-04 NOTE — Progress Notes (Signed)
Patient is here for follow up he is doing well, wife mentions he has leg weakness.

## 2020-01-06 NOTE — Progress Notes (Signed)
Hematology/Oncology Consult note [facility]  [phone] [phone]  Patient Care Team: [name]., MD as PCP - General (Family Medicine) [name], MD as Consulting Physician (Pulmonary Disease) Sindy Guadeloupe, MD as Consulting Physician (Oncology) Verlon Au, NP as Nurse Practitioner (Nurse Practitioner) Leona Singleton, RN as Oncology Nurse Navigator   Name of the patient: Peter Ryan  [id number]  [birth year]   Date of visit: 01/06/20  Diagnosis-  Stage IV DLBCL GCB IPI score- low intermediate risk in CR1  Chief complaint/ Reason for visit-routine follow-up of diffuse large B-cell lymphoma  Heme/Onc history: 1. Patient is a 69 year old male who initially presented with symptoms of worsening shortness of breathand was found to have right pleural effusion on chest x-ray. CT chest with contrast on 11/12/2016 revealed multiple lymph nodes throughout the mediastinum measuring approximately 1 cm in short axis. Upper abdomen showed evidence of ascites and mild cirrhotic changes in the liver. Patient underwent thoracocentesis for recurrent right pleural effusion. Fluid was exudative in nature and LDH in the pleural fluid was elevated. Cytology was negative for malignancy. Patient also had peripheral flow cytometry done which was negative for malignancy. Pleural and peritoneal fluid cytology has been negative for malignancy/ lymphoma  2. Ultrasound of the abdomen on 01/24/2017 showed sludge in the gallbladder and parenchymal of the liver suggesting cirrhosis as well as moderate ascites. Initially his ascites with attribute it to his cirrhosis and nonalcoholic fatty liver disease. He was also treated for culture negative neutrophilic SBP based on asciticfluid analysis. Given that the ascites fluid was exudative in nature which is not consistent with cirrhosis from nonalcoholic fatty liver disease as well as no evidence of  splenomegaly or abnormal LFTs, CT abdomen was obtained to rule out any other etiology for ascites  3. CT abdomen on 03/21/2017 showed: IMPRESSION: 1. Bulky central mesenteric mass lesion encasing major mesenteric vascular anatomy without substantial mass-effect. Diffuse omental caking is evident and peritoneal enhancement is identified. There is associated mesenteric, juxta diaphragmatic, retroperitoneal, and retrocrural lymphadenopathy. Lymphoma would be a consideration. Metastatic disease could also have this appearance. 2. Small a moderate volume ascites  4. Patient denies any unintentional weight loss. His appetite is fair denies any drenching night sweats or recurrent fevers. Does report fatigue  5. PET CT showed: IMPRESSION: 1. Large hypermetabolic left eccentric mesenteric mass with extensive omental caking and peritoneal spread of tumor. This mechanism of spread would seem to favor a gastrointestinal primary, with entities such as carcinoid tumor or lymphoma as differential diagnostic considerations. Tissue diagnosis recommended. 2. Hypermetabolic mediastinal adenopathy compatible with malignancy. Moderate to large bilateral pleural effusions with passive atelectasis and low-grade activity along the pleural fluid-early malignant pleural effusions not excluded. 3. Aortic Atherosclerosis (ICD10-I70.0). 4. No definite involvement of the neck or skeleton. 5. Moderately enlarged prostate gland.  6. Omental biopsy showed: DIAGNOSIS:  A. SOFT TISSUE MASS, LEFT MESENTERY; CT-GUIDED CORE BIOPSY:  - LARGE B-CELL LYMPHOMA, CD10 POSITIVE  FISH testing for BCL 6 and cmycwas negative. Therefore he does not have a double hit lymphoma. Bone marrow biopsy was negative for lymphoma. Normal LDH. He did not meet criteria for cns prophylaxis. His only risk factors being age and stage IV  Interim PET/CT after 3 cycles should excellent response to treatment  Repeat PET/CT scan after 6  cycles of R CHOP showed:Grossly similar size of small bowel mesenteric mass, somewhat difficult to measure secondary to its morphology. Slight increase in mild to  moderate hypermetabolism. 2. No evidence of extr abdominal hypermetabolic lymphoma. 3. Decrease in small right pleural effusion.   Interval history- he is working on his weight loss. Continues to have issues with his gait. He has not had any falls  ECOG PS- 1 Pain scale- 0   Review of systems- Review of Systems  Constitutional: Positive for malaise/fatigue. Negative for chills, fever and weight loss.  HENT: Negative for congestion, ear discharge and nosebleeds.   Eyes: Negative for blurred vision.  Respiratory: Negative for cough, hemoptysis, sputum production, shortness of breath and wheezing.   Cardiovascular: Negative for chest pain, palpitations, orthopnea and claudication.  Gastrointestinal: Negative for abdominal pain, blood in stool, constipation, diarrhea, heartburn, melena, nausea and vomiting.  Genitourinary: Negative for dysuria, flank pain, frequency, hematuria and urgency.  Musculoskeletal: Negative for back pain, joint pain and myalgias.  Skin: Negative for rash.  Neurological: Positive for sensory change (peripheral neuropathy). Negative for dizziness, tingling, focal weakness, seizures, weakness and headaches.  Endo/Heme/Allergies: Does not bruise/bleed easily.  Psychiatric/Behavioral: Negative for depression and suicidal ideas. The patient does not have insomnia.        Allergies  Allergen Reactions  . Metformin Other (See Comments)    Stomach upset      Past Medical History:  Diagnosis Date  . Arthritis   . BPH (benign prostatic hyperplasia)   . Cancer ([place])    Non-Hodgkins lymphoma  . Chronic prostatitis   . Cirrhosis of liver ([place])   . Dyslipidemia   . Elevated PSA   . Gastric reflux   . Gout   . HTN (hypertension)   . Over weight   . Psoriasis   . Testicular pain, right   . Urinary  frequency      Past Surgical History:  Procedure Laterality Date  . hydrocelectomy    . IR FLUORO GUIDE PORT INSERTION RIGHT  04/11/2017  . IR REMOVAL TUN ACCESS W/ PORT W/O FL MOD SED  05/29/2018  . PILONIDAL CYST EXCISION      Social History   Socioeconomic History  . Marital status: Married    Spouse name: Not on file  . Number of children: Not on file  . Years of education: Not on file  . Highest education level: Not on file  Occupational History  . Not on file  Tobacco Use  . Smoking status: Former Smoker    Packs/day: 1.50    Years: 8.00    Pack years: 12.00    Types: Cigarettes    Quit date: 05/29/1974    Years since quitting: 45.6  . Smokeless tobacco: Never Used  . Tobacco comment: quit 40 years ago  Substance and Sexual Activity  . Alcohol use: No    Alcohol/week: 0.0 standard drinks    Comment: rarely  . Drug use: No  . Sexual activity: Not on file  Other Topics Concern  . Not on file  Social History Narrative  . Not on file   Social Determinants of Health   Financial Resource Strain:   . Difficulty of Paying Living Expenses:   Food Insecurity:   . Worried About Charity fundraiser in the Last Year:   . Arboriculturist in the Last Year:   Transportation Needs:   . Film/video editor (Medical):   Marland Kitchen Lack of Transportation (Non-Medical):   Physical Activity:   . Days of Exercise per Week:   . Minutes of Exercise per Session:   Stress:   . Feeling of  Stress :   Social Connections:   . Frequency of Communication with Friends and Family:   . Frequency of Social Gatherings with Friends and Family:   . Attends Religious Services:   . Active Member of Clubs or Organizations:   . Attends Archivist Meetings:   Marland Kitchen Marital Status:   Intimate Partner Violence:   . Fear of Current or Ex-Partner:   . Emotionally Abused:   Marland Kitchen Physically Abused:   . Sexually Abused:     Family History  Problem Relation Age of Onset  . Uterine cancer Mother    . Diabetes Mother   . Cancer Mother   . Cancer Maternal Uncle   . Bladder Cancer Neg Hx   . Kidney disease Neg Hx   . Prostate cancer Neg Hx      Current Outpatient Medications:  .  allopurinol (ZYLOPRIM) 300 MG tablet, TAKE ONE TABLET EVERY DAY, Disp: 30 tablet, Rfl: 3 .  aspirin EC 81 MG tablet, Take 81 mg by mouth daily. , Disp: , Rfl:  .  Cholecalciferol (VITAMIN D3) 1000 UNITS CAPS, Take 1,000 Units by mouth daily. , Disp: , Rfl:  .  finasteride (PROSCAR) 5 MG tablet, Take 1 tablet (5 mg total) by mouth daily., Disp: 90 tablet, Rfl: 3 .  hydrocortisone 2.5 % lotion, APPLY A SMALL AMOUNT TO AFFECTED AREA 3 TIMES A WEEK, Disp: , Rfl:  .  ketoconazole (NIZORAL) 2 % shampoo, SHAMPOO WITH A SMALL AMOUNT AS DIRECTED EVERY OTHER DAY TO DAILY, WASH FACE AND SCALP, LET SIT SEVERAL MINUTES AND RINSE OFF, Disp: , Rfl:  .  losartan (COZAAR) 100 MG tablet, TAKE ONE TABLET EVERY DAY BY MOUTH, Disp: 90 tablet, Rfl: 1 .  amLODipine (NORVASC) 10 MG tablet, TAKE ONE TABLET BY MOUTH EVERY DAY, Disp: 30 tablet, Rfl: 5 .  naproxen (NAPROSYN) 500 MG tablet, Take 1 tablet (500 mg total) by mouth 2 (two) times daily as needed. (Patient not taking: Reported on 11/22/2019), Disp: 60 tablet, Rfl: 0 .  rosuvastatin (CRESTOR) 10 MG tablet, Take 1 tablet (10 mg total) by mouth daily. (Patient not taking: Reported on 01/04/2020), Disp: 30 tablet, Rfl: 0 No current facility-administered medications for this visit.  Facility-Administered Medications Ordered in Other Visits:  .  sodium chloride flush (NS) 0.9 % injection 10 mL, 10 mL, Intravenous, PRN, Sindy Guadeloupe, MD, 10 mL at 05/12/18 1421  Physical exam:  Vitals:   01/04/20 1020  BP: 139/77  Pulse: 79  Resp: 16  Temp: (!) 96.6 F (35.9 C)  TempSrc: Tympanic  SpO2: 99%  Weight: 275 lb 4.8 oz (124.9 kg)   Physical Exam Constitutional:      General: He is not in acute distress.    Appearance: He is obese.  HENT:     Head: Normocephalic and atraumatic.   Eyes:     Pupils: Pupils are equal, round, and reactive to light.  Cardiovascular:     Rate and Rhythm: Normal rate and regular rhythm.     Heart sounds: Normal heart sounds.  Pulmonary:     Effort: Pulmonary effort is normal.     Breath sounds: Normal breath sounds.  Abdominal:     General: Bowel sounds are normal.     Palpations: Abdomen is soft.  Musculoskeletal:     Cervical back: Normal range of motion.  Lymphadenopathy:     Comments: No palpable cervical, supraclavicular, axillary or inguinal adenopathy   Skin:    General: Skin is  warm and dry.  Neurological:     Mental Status: He is alert and oriented to person, place, and time.     Comments: Gait is slow and broad based      CMP Latest Ref Rng & Units 01/04/2020  Glucose 70 - 99 mg/dL 178(H)  BUN 8 - 23 mg/dL 15  Creatinine 0.61 - 1.24 mg/dL 0.97  Sodium 135 - 145 mmol/L 144  Potassium 3.5 - 5.1 mmol/L 4.4  Chloride 98 - 111 mmol/L 103  CO2 22 - 32 mmol/L 28  Calcium 8.9 - 10.3 mg/dL 9.3  Total Protein 6.5 - 8.1 g/dL 8.1  Total Bilirubin 0.3 - 1.2 mg/dL 0.7  Alkaline Phos 38 - 126 U/L 67  AST 15 - 41 U/L 20  ALT 0 - 44 U/L 29   CBC Latest Ref Rng & Units 01/04/2020  WBC 4.0 - 10.5 K/uL 7.2  Hemoglobin 13.0 - 17.0 g/dL 16.0  Hematocrit 39.0 - 52.0 % 48.5  Platelets 150 - 400 K/uL 217    No images are attached to the encounter.  CT Chest W Contrast  Result Date: 01/03/2020 CLINICAL DATA:  Follow-up DLBCL EXAM: CT CHEST, ABDOMEN, AND PELVIS WITH CONTRAST TECHNIQUE: Multidetector CT imaging of the chest, abdomen and pelvis was performed following the standard protocol during bolus administration of intravenous contrast. CONTRAST:  143m OMNIPAQUE IOHEXOL 300 MG/ML SOLN, additional oral enteric contrast COMPARISON:  CT chest abdomen pelvis, 11/12/2018, PET-CT, 05/08/2018 FINDINGS: CT CHEST FINDINGS Cardiovascular: Three-vessel coronary artery calcifications. Normal heart size. No pericardial effusion.  Mediastinum/Nodes: No enlarged mediastinal, hilar, or axillary lymph nodes. Thyroid gland, trachea, and esophagus demonstrate no significant findings. Lungs/Pleura: Lungs are clear. No pleural effusion or pneumothorax. Musculoskeletal: No chest wall mass or suspicious bone lesions identified. CT ABDOMEN PELVIS FINDINGS Hepatobiliary: No solid liver abnormality is seen. Hepatic steatosis. Layering sludge in the gallbladder. Gallbladder wall thickening, or biliary dilatation. Pancreas: Unremarkable. No pancreatic ductal dilatation or surrounding inflammatory changes. Spleen: Normal in size without significant abnormality. Adrenals/Urinary Tract: Adrenal glands are unremarkable. Exophytic cyst of the anterior midportion of the left kidney. Kidneys are otherwise normal, without renal calculi, solid lesion, or hydronephrosis. Bladder is unremarkable. Stomach/Bowel: Stomach is within normal limits. Stable or slightly decreased size of a flattened soft tissue mass about the vessels of the small bowel mesentery in the left hemiabdomen, measuring approximately 12.8 x 3.1 cm in cross-section, previously 12.7 x 3.6 cm when measured similarly (series 2, image 84). Sigmoid diverticulosis. Vascular/Lymphatic: Aortic atherosclerosis. No enlarged abdominal or pelvic lymph nodes. Reproductive: No mass or other abnormality. Other: No abdominal wall hernia or abnormality. No abdominopelvic ascites. Musculoskeletal: No acute or significant osseous findings. IMPRESSION: 1. Stable or slightly decreased size of a flattened soft tissue mass about the vessel in the small bowel mesentery in the left hemiabdomen, consistent with post treatment appearance of lymphoma. No evidence of new abnormal soft tissue or lymphadenopathy in the chest, abdomen, or pelvis. 2.  Hepatic steatosis. 3.  Coronary artery disease.  Aortic Atherosclerosis (ICD10-I70.0). Electronically Signed   By: AEddie CandleM.D.   On: 01/03/2020 16:54   CT Abdomen Pelvis W  Contrast  Result Date: 01/03/2020 CLINICAL DATA:  Follow-up DLBCL EXAM: CT CHEST, ABDOMEN, AND PELVIS WITH CONTRAST TECHNIQUE: Multidetector CT imaging of the chest, abdomen and pelvis was performed following the standard protocol during bolus administration of intravenous contrast. CONTRAST:  1028mOMNIPAQUE IOHEXOL 300 MG/ML SOLN, additional oral enteric contrast COMPARISON:  CT chest abdomen pelvis, 11/12/2018, PET-CT, 05/08/2018  FINDINGS: CT CHEST FINDINGS Cardiovascular: Three-vessel coronary artery calcifications. Normal heart size. No pericardial effusion. Mediastinum/Nodes: No enlarged mediastinal, hilar, or axillary lymph nodes. Thyroid gland, trachea, and esophagus demonstrate no significant findings. Lungs/Pleura: Lungs are clear. No pleural effusion or pneumothorax. Musculoskeletal: No chest wall mass or suspicious bone lesions identified. CT ABDOMEN PELVIS FINDINGS Hepatobiliary: No solid liver abnormality is seen. Hepatic steatosis. Layering sludge in the gallbladder. Gallbladder wall thickening, or biliary dilatation. Pancreas: Unremarkable. No pancreatic ductal dilatation or surrounding inflammatory changes. Spleen: Normal in size without significant abnormality. Adrenals/Urinary Tract: Adrenal glands are unremarkable. Exophytic cyst of the anterior midportion of the left kidney. Kidneys are otherwise normal, without renal calculi, solid lesion, or hydronephrosis. Bladder is unremarkable. Stomach/Bowel: Stomach is within normal limits. Stable or slightly decreased size of a flattened soft tissue mass about the vessels of the small bowel mesentery in the left hemiabdomen, measuring approximately 12.8 x 3.1 cm in cross-section, previously 12.7 x 3.6 cm when measured similarly (series 2, image 84). Sigmoid diverticulosis. Vascular/Lymphatic: Aortic atherosclerosis. No enlarged abdominal or pelvic lymph nodes. Reproductive: No mass or other abnormality. Other: No abdominal wall hernia or abnormality. No  abdominopelvic ascites. Musculoskeletal: No acute or significant osseous findings. IMPRESSION: 1. Stable or slightly decreased size of a flattened soft tissue mass about the vessel in the small bowel mesentery in the left hemiabdomen, consistent with post treatment appearance of lymphoma. No evidence of new abnormal soft tissue or lymphadenopathy in the chest, abdomen, or pelvis. 2.  Hepatic steatosis. 3.  Coronary artery disease.  Aortic Atherosclerosis (ICD10-I70.0). Electronically Signed   By: Eddie Candle M.D.   On: 01/03/2020 16:54     Assessment and plan- Patient is a 69 y.o. male h/o Stage IV DLBCL GCB, not double hit, low intermediaterisk IPIstatus post 6 cycles of R CHOP chemotherapy in CR1 since nov 2018. This is a routine f/u visit for lymphoma surveillance   I have reviewed CT chest abdomen pelvis images independently. He has residual mesenteric mass close to 12 cm which has remained unchanged. No growth in the last 2 years and therefore likely does not represent active lymphoma. He does not need surveillance scans at this time more than 2 years out. Scans only for concerning signs/ symptoms.   I will see her back in 6 months with cbc with diff, cmp and LDH  Chemo induced peripheral neuropathy- patient does not wish to start any medications at this time. Continue to monitor    Visit Diagnosis 1. Encounter for follow-up surveillance of diffuse large B-cell lymphoma   2. Chemotherapy-induced peripheral neuropathy ([place])      Dr. Randa Evens, MD, MPH [facility] at [facility] [phone] 01/06/2020 11:32 AM

## 2020-01-10 LAB — HM DIABETES EYE EXAM

## 2020-01-11 DIAGNOSIS — M6281 Muscle weakness (generalized): Secondary | ICD-10-CM | POA: Diagnosis not present

## 2020-01-11 DIAGNOSIS — R200 Anesthesia of skin: Secondary | ICD-10-CM | POA: Diagnosis not present

## 2020-01-11 DIAGNOSIS — E559 Vitamin D deficiency, unspecified: Secondary | ICD-10-CM | POA: Diagnosis not present

## 2020-01-11 DIAGNOSIS — R2689 Other abnormalities of gait and mobility: Secondary | ICD-10-CM | POA: Diagnosis not present

## 2020-01-11 DIAGNOSIS — R202 Paresthesia of skin: Secondary | ICD-10-CM | POA: Diagnosis not present

## 2020-01-11 DIAGNOSIS — E538 Deficiency of other specified B group vitamins: Secondary | ICD-10-CM | POA: Diagnosis not present

## 2020-01-11 DIAGNOSIS — E519 Thiamine deficiency, unspecified: Secondary | ICD-10-CM | POA: Diagnosis not present

## 2020-01-11 DIAGNOSIS — E531 Pyridoxine deficiency: Secondary | ICD-10-CM | POA: Diagnosis not present

## 2020-01-13 ENCOUNTER — Ambulatory Visit: Payer: Self-pay | Admitting: Family Medicine

## 2020-01-20 DIAGNOSIS — Z23 Encounter for immunization: Secondary | ICD-10-CM | POA: Diagnosis not present

## 2020-01-31 ENCOUNTER — Ambulatory Visit: Payer: Medicare Other | Admitting: Dermatology

## 2020-02-10 DIAGNOSIS — H2511 Age-related nuclear cataract, right eye: Secondary | ICD-10-CM | POA: Diagnosis not present

## 2020-02-10 DIAGNOSIS — H25043 Posterior subcapsular polar age-related cataract, bilateral: Secondary | ICD-10-CM | POA: Diagnosis not present

## 2020-02-10 DIAGNOSIS — H25013 Cortical age-related cataract, bilateral: Secondary | ICD-10-CM | POA: Diagnosis not present

## 2020-02-10 DIAGNOSIS — H18413 Arcus senilis, bilateral: Secondary | ICD-10-CM | POA: Diagnosis not present

## 2020-02-10 DIAGNOSIS — H2513 Age-related nuclear cataract, bilateral: Secondary | ICD-10-CM | POA: Diagnosis not present

## 2020-02-21 ENCOUNTER — Other Ambulatory Visit: Payer: Self-pay

## 2020-02-21 ENCOUNTER — Ambulatory Visit: Payer: Medicare Other | Attending: Neurology

## 2020-02-21 DIAGNOSIS — M6281 Muscle weakness (generalized): Secondary | ICD-10-CM

## 2020-02-21 DIAGNOSIS — R2689 Other abnormalities of gait and mobility: Secondary | ICD-10-CM | POA: Diagnosis not present

## 2020-02-21 NOTE — Patient Instructions (Signed)
  Access Code: [email or web address] URL: [email or web address] Date: 02/21/2020 Prepared by: Janna Arch  Exercises Seated Scapular Retraction - 1 x daily - 7 x weekly - 10 reps - 2 sets - 5 hold Seated Heel Toe Raises - 1 x daily - 7 x weekly - 10 reps - 2 sets - 5 hold Seated Hip Adduction Isometrics with Ball - 1 x daily - 7 x weekly - 10 reps - 2 sets - 5 hold Seated Ankle Alphabet - 1 x daily - 7 x weekly - 10 reps - 2 sets - 5 hold

## 2020-02-21 NOTE — Therapy (Signed)
[place] MAIN [facility] SERVICES [address] [place], [place], [address] Phone: [phone]   Fax:  [phone]  Physical Therapy Evaluation  Patient Details  Name: Peter Ryan MRN: [id number] Date of Birth: 1950/11/05 Referring Provider (PT): Jennings Books, MD   Encounter Date: 02/21/2020  PT End of Session - 02/21/20 1630    Visit Number  1    Number of Visits  16    Date for PT Re-Evaluation  04/17/20    Authorization Type  1/10 eval 5/24    PT Start Time  1430    PT Stop Time  1526    PT Time Calculation (min)  56 min    Equipment Utilized During Treatment  Gait belt    Activity Tolerance  Patient tolerated treatment well;Patient limited by fatigue    Behavior During Therapy  WFL for tasks assessed/performed       Past Medical History:  Diagnosis Date  . Arthritis   . BPH (benign prostatic hyperplasia)   . Cancer ([place])    Non-Hodgkins lymphoma  . Chronic prostatitis   . Cirrhosis of liver ([place])   . Dyslipidemia   . Elevated PSA   . Gastric reflux   . Gout   . HTN (hypertension)   . Over weight   . Psoriasis   . Testicular pain, right   . Urinary frequency     Past Surgical History:  Procedure Laterality Date  . hydrocelectomy    . IR FLUORO GUIDE PORT INSERTION RIGHT  04/11/2017  . IR REMOVAL TUN ACCESS W/ PORT W/O FL MOD SED  05/29/2018  . PILONIDAL CYST EXCISION      There were no vitals filed for this visit.   Subjective Assessment - 02/21/20 1440    Subjective  Patient presents to physical therapy for evaluation of imbalance.    Patient is accompained by:  Family member    Pertinent History  Patient is a pleasant 69 year old male with a PMH of arthritis, DM type II, dyslipidemia, gout, cancer; non hodgkins lymphoma, chronic prostatitis, cirrhosis of the liver, HTN, psoriasis, and reflux. Patient presents for balance and walking. Patient has been weakening and wants to be as active as hee can be. Has a brand new  motorcycle and wants to help working at home. Wife and patient got laid off last year.  Right eye cataracts will be corrected soon. Weakness began in 2018, has fallen.  Has a cane but does not use it, same with a walker.    Limitations  Standing;Walking;House hold activities    How long can you sit comfortably?  n/a    How long can you stand comfortably?  3 minutes    How long can you walk comfortably?  to the mailbox and back    Patient Stated Goals  to get stronger, walk better, get back to doing his normal tasks.    Currently in Pain?  Yes    Pain Score  2     Pain Location  Knee    Pain Orientation  Right;Left    Pain Descriptors / Indicators  Aching    Pain Type  Chronic pain    Pain Onset  More than a month ago    Pain Frequency  Intermittent    Aggravating Factors   weightbearing    Pain Relieving Factors  nonweightbearing    Effect of Pain on Daily Activities  limits mobility and putting on shoes/socks  [facility] PT Assessment - 02/21/20 0001      Assessment   Medical Diagnosis  imbalance    Referring Provider (PT)  shah, Maudry Diego, MD    Onset Date/Surgical Date  --   2018   Hand Dominance  Right    Prior Therapy  no       Precautions   Precautions  Fall      Restrictions   Weight Bearing Restrictions  No      Balance Screen   Has the patient fallen in the past 6 months  Yes    How many times?  2    Has the patient had a decrease in activity level because of a fear of falling?   Yes    Is the patient reluctant to leave their home because of a fear of falling?   Yes      [place]  Private residence    Living Arrangements  Spouse/significant other    Type of [place] to enter    Entrance Stairs-Number of Steps  4-5    Entrance Stairs-Rails  Right    Home Layout  One level    Home Equipment  Grab bars - toilet;Grab bars - tub/shower      Prior Function   Level of Independence  Other (comment)   help  putting on shoes and socks.    Vocation  Other (comment)   lost job last year    Leisure  Systems analyst      Berg Balance Test   Sit to Stand  Able to stand  independently using hands    Standing Unsupported  Able to stand 2 minutes with supervision    Sitting with Back Unsupported but Feet Supported on Floor or Stool  Able to sit safely and securely 2 minutes    Stand to Sit  Controls descent by using hands    Transfers  Able to transfer safely, definite need of hands    Standing Unsupported with Eyes Closed  Able to stand 10 seconds with supervision    Standing Unsupported with Feet Together  Able to place feet together independently and stand for 1 minute with supervision    From Standing, Reach Forward with Outstretched Arm  Reaches forward but needs supervision    From Standing Position, Pick up Object from Floor  Able to pick up shoe, needs supervision    From Standing Position, Turn to Look Behind Over each Shoulder  Turn sideways only but maintains balance    Turn 360 Degrees  Needs close supervision or verbal cueing    Standing Unsupported, Alternately Place Feet on Step/Stool  Needs assistance to keep from falling or unable to try    Standing Unsupported, One Foot in [employer] balance while stepping or standing    Standing on One Leg  Unable to try or needs assist to prevent fall    Total Score  29       PAIN: Bilateral knees hurt: posterior pain;  Worst pain: 7-8/10  Least: 0/10   Back pain when standing too long.   POSTURE: Seated: forward head rounded shoulders, knees abducted  Standing: hips flexed with limited lumbar curvature; excessive thoracic flexion with limited shoulder retraction  PROM/AROM: Limited hip flexors bilaterally Limited pectoral musculature limitations  STRENGTH:  Graded on a  0-5 scale Muscle Group Left Right  Hip Flex 4-/5 4-/5  Hip Abd 3+/5 3+/5   Hip Add 3/5 3/5  Hip Ext 2+/5 2+/5  Hip IR/ER    Knee Flex 3+/5 3+/5  Knee Ext 4-/5 4-/5  Ankle DF 4-/5 4-/5  Ankle PF 4-/5 4-/5   SENSATION: Limited bilaterally to light touch  SPECIAL TESTS: Gross coordination: limited bilaterally by knee discomfort  FUNCTIONAL MOBILITY: STS: heavy reliance upon UE's for transition to/from position   BALANCE: Dynamic Sitting Balance  Normal Able to sit unsupported and weight shift across midline maximally   Good Able to sit unsupported and weight shift across midline moderately   Good-/Fair+ Able to sit unsupported and weight shift across midline minimally x  Fair Minimal weight shifting ipsilateral/front, difficulty crossing midline   Fair- Reach to ipsilateral side and unable to weight shift   Poor + Able to sit unsupported with min A and reach to ipsilateral side, unable to weight shift   Poor Able to sit unsupported with mod A and reach ipsilateral/front-can't cross midline     Standing Dynamic Balance  Normal Stand independently unsupported, able to weight shift and cross midline maximally   Good Stand independently unsupported, able to weight shift and cross midline moderately   Good-/Fair+ Stand independently unsupported, able to weight shift across midline minimally   Fair Stand independently unsupported, weight shift, and reach ipsilaterally, loss of balance when crossing midline   Poor+ Able to stand with Min A and reach ipsilaterally, unable to weight shift x  Poor Able to stand with Mod A and minimally reach ipsilaterally, unable to cross midline.     Static Sitting Balance  Normal Able to maintain balance against maximal resistance   Good Able to maintain balance against moderate resistance   Good-/Fair+ Accepts minimal resistance x  Fair Able to sit unsupported without balance loss and without UE support   Poor+ Able to maintain with Minimal assistance from individual or chair   Poor Unable to maintain balance-requires  mod/max support from individual or chair     Static Standing Balance  Normal Able to maintain standing balance against maximal resistance   Good Able to maintain standing balance against moderate resistance   Good-/Fair+ Able to maintain standing balance against minimal resistance   Fair Able to stand unsupported without UE support and without LOB for 1-2 min x  Fair- Requires Min A and UE support to maintain standing without loss of balance   Poor+ Requires mod A and UE support to maintain standing without loss of balance   Poor Requires max A and UE support to maintain standing balance without loss       GAIT: Patient ambulates with small shuffle steps, forward trunk lean, limited arm swing bilaterally with arms not reaching past hips.   OUTCOME MEASURES: TEST Outcome Interpretation  5 times sit<>stand Unable to tolerate  >60 yo, >15 sec indicates increased risk for falls  10 meter walk test         15.98 =0.632       m/s <1.0 m/s indicates increased risk for falls; limited community ambulator          Berg Balance Assessment 29/56 <36/56 (100% risk for falls), 37-45 (80% risk for falls); 46-51 (>50% risk for falls); 52-55 (lower risk <25% of falls)  FOTO 45/100 [place] prediction of 57/100.         Access Code: [email or web address] URL: [email or web address] Date: 02/21/2020 Prepared by: Janna Arch  Exercises Seated  Scapular Retraction - 1 x daily - 7 x weekly - 10 reps - 2 sets - 5 hold Seated Heel Toe Raises - 1 x daily - 7 x weekly - 10 reps - 2 sets - 5 hold Seated Hip Adduction Isometrics with Ball - 1 x daily - 7 x weekly - 10 reps - 2 sets - 5 hold Seated Ankle Alphabet - 1 x daily - 7 x weekly - 10 reps - 2 sets - 5 hold     Objective measurements completed on examination: See above findings.              PT Education - 02/21/20 1630    Education Details  goals, POC, HEP    Person(s) Educated  Patient;Spouse    Methods   Explanation;Demonstration;Tactile cues;Verbal cues;Handout    Comprehension  Tactile cues required;Verbal cues required;Returned demonstration;Verbalized understanding       PT Short Term Goals - 02/21/20 1634      PT SHORT TERM GOAL #1   Title  Patient will be independent in home exercise program to improve strength/mobility for better functional independence with ADLs.    Baseline  5/24: HEP given    Time  2    Period  Weeks    Status  New    Target Date  03/06/20        PT Long Term Goals - 02/21/20 1635      PT LONG TERM GOAL #1   Title  Patient will increase FOTO score to equal to or greater than 57/100  to demonstrate statistically significant improvement in mobility and quality of life.    Baseline  5/24: 45/100    Time  8    Period  Weeks    Status  New    Target Date  04/17/20      PT LONG TERM GOAL #2   Title  Patient will increase Berg Balance score by > 6 points (35/56) to demonstrate decreased fall risk during functional activities.    Baseline  5/24: 29/56    Time  8    Period  Weeks    Status  New    Target Date  04/17/20      PT LONG TERM GOAL #3   Title  Patient will increase 10 meter walk test to >1.71m/s as to improve gait speed for better community ambulation and to reduce fall risk.    Baseline  5/24: 0.632 m/s    Time  8    Period  Weeks    Status  New    Target Date  04/17/20      PT LONG TERM GOAL #4   Title  Patient will increase BLE gross strength to 4+/5 as to improve functional strength for independent gait, increased standing tolerance and increased ADL ability    Baseline  5/24: see note    Time  8    Period  Weeks    Status  New    Target Date  04/17/20             Plan - 02/21/20 1631    Clinical Impression Statement  Patient is a pleasant 69 year old male who presents for imbalance and weakness. He is reliant upon his UE's for transfers but does ambulate at this time without an AD. His gait mechanics are poor with forward  center of mass and limited foot clearance bilaterally. His balance is limited by single limb stability as well as vision deficits. Patient  will benefit from skilled physical therapy to improve strength, stability, and mobility for improved quality of life and return to PLOF.    Personal Factors and Comorbidities  Age;Comorbidity 3+    Comorbidities  arthritis, DM type II, dyslipidemia, gout, cancer; non hodgkins lymphoma, chronic prostatitis, cirrhosis of the liver, HTN, psoriasis, and reflux.    Examination-Activity Limitations  Bed Mobility;Bend;Caring for [employer];Locomotion Level;Lift;Stand;Toileting;Transfers    Examination-Participation Restrictions  Church;Cleaning;Community Activity;Driving;Interpersonal Relationship;Laundry;Volunteer;Shop;Meal Prep;Yard Work    Merchant [employer] officer  Evolving/Moderate complexity    Clinical Decision Making  Moderate    Rehab Potential  Fair    PT Frequency  2x / week    PT Duration  8 weeks    PT Treatment/Interventions  ADLs/Self Care Home Management;Aquatic Therapy;Biofeedback;Canalith Repostioning;Cryotherapy;Electrical Stimulation;Iontophoresis 4mg /ml Dexamethasone;Moist Heat;Traction;Ultrasound;DME Instruction;Gait training;Stair training;Functional mobility training;Therapeutic activities;Patient/family education;Neuromuscular re-education;Balance training;Therapeutic exercise;Orthotic Fit/Training;Manual techniques;Splinting;Energy conservation;Dry needling;Passive range of motion;Compression bandaging;Taping;Vasopneumatic Device;Vestibular;Visual/perceptual remediation/compensation    PT Next Visit Plan  review HEP, posture, LE strength, balance    PT Home Exercise Plan  see above    Consulted and Agree with Plan of Care  Patient       Patient will benefit from skilled therapeutic intervention in order to improve the following deficits and impairments:  Abnormal gait, Decreased activity  tolerance, Decreased balance, Decreased endurance, Decreased coordination, Decreased knowledge of use of DME, Decreased mobility, Decreased range of motion, Difficulty walking, Decreased strength, Impaired flexibility, Impaired perceived functional ability, Increased muscle spasms, Impaired vision/preception, Obesity, Postural dysfunction, Improper body mechanics, Impaired sensation, Pain  Visit Diagnosis: Muscle weakness (generalized)  Other abnormalities of gait and mobility     Problem List Patient Active Problem List   Diagnosis Date Noted  . Goals of care, counseling/discussion 04/08/2017  . 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 ([place]) 04/08/2017  . Ascites 02/08/2017  . Liver cirrhosis ([place]) 02/08/2017  . SBP (spontaneous bacterial peritonitis) ([place]) 02/07/2017  . 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  . DOE (dyspnea on exertion) 11/15/2016  . Obesity, morbid (more than 100 lbs over ideal weight or BMI > 40) ([place]) 11/15/2016  . Elevated PSA 05/30/2015  . BPH with obstruction/lower urinary tract symptoms 05/30/2015  . Type 2 diabetes mellitus ([place]) 05/18/2014  . Arthritis 01/03/2014  . Dyslipidemia 01/03/2014  . Benign essential hypertension 01/03/2014  . Gout 01/03/2014  . Psoriasis 01/03/2014  . Reflux 01/03/2014   Janna Arch, PT, DPT   02/21/2020, 4:38 PM  [place] MAIN [facility] SERVICES [address] [place], [place], [address] Phone: [phone]   Fax:  [phone]  Name: Peter Ryan MRN: [id number] Date of Birth: 1951-01-02

## 2020-02-23 ENCOUNTER — Ambulatory Visit: Payer: Medicare Other

## 2020-02-23 ENCOUNTER — Other Ambulatory Visit: Payer: Self-pay

## 2020-02-23 DIAGNOSIS — N138 Other obstructive and reflux uropathy: Secondary | ICD-10-CM

## 2020-02-23 DIAGNOSIS — R2689 Other abnormalities of gait and mobility: Secondary | ICD-10-CM

## 2020-02-23 DIAGNOSIS — Z87898 Personal history of other specified conditions: Secondary | ICD-10-CM

## 2020-02-23 DIAGNOSIS — M6281 Muscle weakness (generalized): Secondary | ICD-10-CM

## 2020-02-23 NOTE — Therapy (Signed)
[place] MAIN [facility] SERVICES [address] [place], [place], [address] Phone: [phone]   Fax:  [phone]  Physical Therapy Treatment  Patient Details  Name: Peter Ryan MRN: [id number] Date of Birth: [birth year] Referring Provider (PT): Jennings Books, MD   Encounter Date: 02/23/2020  PT End of Session - 02/23/20 1239    Visit Number  2    Number of Visits  16    Date for PT Re-Evaluation  04/17/20    Authorization Type  2/10 eval 5/24    PT Start Time  1100    PT Stop Time  1147    PT Time Calculation (min)  47 min    Equipment Utilized During Treatment  Gait belt    Activity Tolerance  Patient tolerated treatment well;Patient limited by fatigue    Behavior During Therapy  [facility] for tasks assessed/performed       Past Medical History:  Diagnosis Date  . Arthritis   . BPH (benign prostatic hyperplasia)   . Cancer ([place])    Non-Hodgkins lymphoma  . Chronic prostatitis   . Cirrhosis of liver ([place])   . Dyslipidemia   . Elevated PSA   . Gastric reflux   . Gout   . HTN (hypertension)   . Over weight   . Psoriasis   . Testicular pain, right   . Urinary frequency     Past Surgical History:  Procedure Laterality Date  . hydrocelectomy    . IR FLUORO GUIDE PORT INSERTION RIGHT  04/11/2017  . IR REMOVAL TUN ACCESS W/ PORT W/O FL MOD SED  05/29/2018  . PILONIDAL CYST EXCISION      There were no vitals filed for this visit.  Subjective Assessment - 02/23/20 1238    Subjective  Patient presents with wife, reports compliance with HEP yesterday. Eager to progress strength and balance.    Patient is accompained by:  Family member    Pertinent History  Patient is a pleasant 69 year old male with a PMH of arthritis, DM type II, dyslipidemia, gout, cancer; non hodgkins lymphoma, chronic prostatitis, cirrhosis of the liver, HTN, psoriasis, and reflux. Patient presents for balance and walking. Patient has been weakening and wants to be as  active as hee can be. Has a brand new motorcycle and wants to help working at home. Wife and patient got laid off last year.  Right eye cataracts will be corrected soon. Weakness began in 2018, has fallen.  Has a cane but does not use it, same with a walker.    Limitations  Standing;Walking;House hold activities    How long can you sit comfortably?  n/a    How long can you stand comfortably?  3 minutes    How long can you walk comfortably?  to the mailbox and back    Patient Stated Goals  to get stronger, walk better, get back to doing his normal tasks.    Currently in Pain?  Yes    Pain Score  2     Pain Location  Knee    Pain Orientation  Right;Left    Pain Descriptors / Indicators  Aching    Pain Type  Chronic pain    Pain Onset  More than a month ago    Pain Frequency  Intermittent            Standing with CGA next to support surface:  Airex pad: static stand 30 seconds x 2 trials, noticeable trembling of ankles/LE's  with fatigue and challenge to maintain stability Airex pad:6" step toe taps SUE support 10x each LE Airex pad: one foot on 6" step one foot on airex pad, hold position for 30 seconds, switch legs, 2x each LE;; SUE support  Standing with BUE support: tactile cueing for upright posture -hip extension 10x each LE,  -hip abduction 10x each LE; max cueing for keeping toes neutral alignment -heel raises 10x -toe raises, cue not to rock 10x  Seated: Seated rock forward to clear bottom from raised plinth table to intiiate sit to stand 10x ; 2 sets RTB row with PT holding opp end, cues for elbows at side 10x; 2 sets  RTB adduction single LE at a time against PT resistance 12x each side  ER/IR rainbow ball between knees 10x ; very challenging Modified windmill 10x each side      Access Code: [email or web address] URL: [email or web address] Date: 02/23/2020 Prepared by: Janna Arch  Exercises Standing Hip Extension with Counter Support - 1 x daily - 7 x weekly -  2 sets - 10 reps - 5 hold Standing Hip Abduction with Counter Support - 1 x daily - 7 x weekly - 2 sets - 10 reps - 5 hold  Patient requires frequent seated rest breaks between interventions due to fatigue.      Pt educated throughout session about proper posture and technique with exercises. Improved exercise technique, movement at target joints, use of target muscles after min to mod verbal, visual, tactile cues.                PT Education - 02/23/20 1239    Education Details  exercise technique, body mechanics, HEP progression    Person(s) Educated  Patient;Spouse    Methods  Explanation;Demonstration;Tactile cues;Verbal cues;Handout    Comprehension  Verbalized understanding;Returned demonstration;Verbal cues required;Tactile cues required       PT Short Term Goals - 02/21/20 1634      PT SHORT TERM GOAL #1   Title  Patient will be independent in home exercise program to improve strength/mobility for better functional independence with ADLs.    Baseline  5/24: HEP given    Time  2    Period  Weeks    Status  New    Target Date  03/06/20        PT Long Term Goals - 02/21/20 1635      PT LONG TERM GOAL #1   Title  Patient will increase FOTO score to equal to or greater than 57/100  to demonstrate statistically significant improvement in mobility and quality of life.    Baseline  5/24: 45/100    Time  8    Period  Weeks    Status  New    Target Date  04/17/20      PT LONG TERM GOAL #2   Title  Patient will increase Berg Balance score by > 6 points (35/56) to demonstrate decreased fall risk during functional activities.    Baseline  5/24: 29/56    Time  8    Period  Weeks    Status  New    Target Date  04/17/20      PT LONG TERM GOAL #3   Title  Patient will increase 10 meter walk test to >1.10m/s as to improve gait speed for better community ambulation and to reduce fall risk.    Baseline  5/24: 0.632 m/s    Time  8    Period  Weeks  Status  New     Target Date  04/17/20      PT LONG TERM GOAL #4   Title  Patient will increase BLE gross strength to 4+/5 as to improve functional strength for independent gait, increased standing tolerance and increased ADL ability    Baseline  5/24: see note    Time  8    Period  Weeks    Status  New    Target Date  04/17/20            Plan - 02/23/20 1240    Clinical Impression Statement  Patient presents with wife for physical therapy session. HEP progressed to adding standing interventions when feeling stable. Patient is challenged with ankle righting reactions and fatigues quickly requiring frequent rest breaks. He remains highly motivated throughout session despite fatigue. Patient will benefit from skilled physical therapy to improve strength, stability, and mobility for improved quality of life and return to PLOF.    Personal Factors and Comorbidities  Age;Comorbidity 3+    Comorbidities  arthritis, DM type II, dyslipidemia, gout, cancer; non hodgkins lymphoma, chronic prostatitis, cirrhosis of the liver, HTN, psoriasis, and reflux.    Examination-Activity Limitations  Bed Mobility;Bend;Caring for [employer];Locomotion Level;Lift;Stand;Toileting;Transfers    Examination-Participation Restrictions  Church;Cleaning;Community Activity;Driving;Interpersonal Relationship;Laundry;Volunteer;Shop;Meal Prep;Yard Work    [employer]chant [employer] officer  Evolving/Moderate complexity    Rehab Potential  Fair    PT Frequency  2x / week    PT Duration  8 weeks    PT Treatment/Interventions  ADLs/Self Care Home Management;Aquatic Therapy;Biofeedback;Canalith Repostioning;Cryotherapy;Electrical Stimulation;Iontophoresis 4mg /ml Dexamethasone;Moist Heat;Traction;Ultrasound;DME Instruction;Gait training;Stair training;Functional mobility training;Therapeutic activities;Patient/family education;Neuromuscular re-education;Balance training;Therapeutic exercise;Orthotic  Fit/Training;Manual techniques;Splinting;Energy conservation;Dry needling;Passive range of motion;Compression bandaging;Taping;Vasopneumatic Device;Vestibular;Visual/perceptual remediation/compensation    PT Next Visit Plan  review HEP, posture, LE strength, balance    PT Home Exercise Plan  see above    Consulted and Agree with Plan of Care  Patient       Patient will benefit from skilled therapeutic intervention in order to improve the following deficits and impairments:  Abnormal gait, Decreased activity tolerance, Decreased balance, Decreased endurance, Decreased coordination, Decreased knowledge of use of DME, Decreased mobility, Decreased range of motion, Difficulty walking, Decreased strength, Impaired flexibility, Impaired perceived functional ability, Increased muscle spasms, Impaired vision/preception, Obesity, Postural dysfunction, Improper body mechanics, Impaired sensation, Pain  Visit Diagnosis: Muscle weakness (generalized)  Other abnormalities of gait and mobility     Problem List Patient Active Problem List   Diagnosis Date Noted  . Goals of care, counseling/discussion 04/08/2017  . 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 ([place]) 04/08/2017  . Ascites 02/08/2017  . Liver cirrhosis ([place]) 02/08/2017  . SBP (spontaneous bacterial peritonitis) ([place]) 02/07/2017  . 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  . DOE (dyspnea on exertion) 11/15/2016  . Obesity, morbid (more than 100 lbs over ideal weight or BMI > 40) ([place]) 11/15/2016  . Elevated PSA 05/30/2015  . BPH with obstruction/lower urinary tract symptoms 05/30/2015  . Type 2 diabetes mellitus ([place]) 05/18/2014  . Arthritis 01/03/2014  . Dyslipidemia 01/03/2014  . Benign essential hypertension 01/03/2014  . Gout 01/03/2014  . Psoriasis 01/03/2014  . Reflux 01/03/2014   Janna Arch, PT, DPT   02/23/2020, 12:42 PM  [place] MAIN [facility]  SERVICES [address] [place], [place], [address] Phone: [phone]   Fax:  [phone]  Name: Abell Salzberg MRN: [id number] Date of Birth: May 23, 1951

## 2020-02-24 ENCOUNTER — Other Ambulatory Visit: Payer: Self-pay | Admitting: Urology

## 2020-02-24 ENCOUNTER — Other Ambulatory Visit: Payer: Self-pay

## 2020-02-24 ENCOUNTER — Other Ambulatory Visit: Payer: Medicare Other

## 2020-02-24 DIAGNOSIS — Z87898 Personal history of other specified conditions: Secondary | ICD-10-CM

## 2020-02-24 DIAGNOSIS — N138 Other obstructive and reflux uropathy: Secondary | ICD-10-CM | POA: Diagnosis not present

## 2020-02-24 DIAGNOSIS — N401 Enlarged prostate with lower urinary tract symptoms: Secondary | ICD-10-CM | POA: Diagnosis not present

## 2020-02-29 LAB — PSA: Prostate Specific Ag, Serum: 1.7 ng/mL (ref 0.0–4.0)

## 2020-03-01 ENCOUNTER — Encounter: Payer: Self-pay | Admitting: Physical Therapy

## 2020-03-01 ENCOUNTER — Ambulatory Visit: Payer: Medicare Other | Attending: Neurology | Admitting: Physical Therapy

## 2020-03-01 ENCOUNTER — Other Ambulatory Visit: Payer: Self-pay

## 2020-03-01 ENCOUNTER — Telehealth: Payer: Self-pay | Admitting: Family Medicine

## 2020-03-01 DIAGNOSIS — R2689 Other abnormalities of gait and mobility: Secondary | ICD-10-CM | POA: Diagnosis present

## 2020-03-01 DIAGNOSIS — M6281 Muscle weakness (generalized): Secondary | ICD-10-CM | POA: Diagnosis present

## 2020-03-01 NOTE — Telephone Encounter (Signed)
-----   Message from Nori Riis, PA-C sent at 02/29/2020  1:04 PM EDT ----- Please let Mr. Wengerd know that his PSA is stable and we need to see him in 6 months for PSA, I PSS and exam.

## 2020-03-01 NOTE — Telephone Encounter (Signed)
Patient notified and appointment scheduled. 

## 2020-03-01 NOTE — Therapy (Signed)
[place] MAIN [facility] SERVICES [address] [place], [place], [address] Phone: [phone]   Fax:  [phone]  Physical Therapy Treatment  Patient Details  Name: Peter Ryan MRN: [id number] Date of Birth: Feb 26, 1951 Referring Provider (PT): Jennings Books, MD   Encounter Date: 03/01/2020  PT End of Session - 03/01/20 1143    Visit Number  3    Number of Visits  16    Date for PT Re-Evaluation  04/17/20    Authorization Type  3/10 eval 5/24    PT Start Time  1146    PT Stop Time  1230    PT Time Calculation (min)  44 min    Equipment Utilized During Treatment  Gait belt    Activity Tolerance  Patient tolerated treatment well;Patient limited by fatigue    Behavior During Therapy  [facility] for tasks assessed/performed       Past Medical History:  Diagnosis Date  . Arthritis   . BPH (benign prostatic hyperplasia)   . Cancer ([place])    Non-Hodgkins lymphoma  . Chronic prostatitis   . Cirrhosis of liver ([place])   . Dyslipidemia   . Elevated PSA   . Gastric reflux   . Gout   . HTN (hypertension)   . Over weight   . Psoriasis   . Testicular pain, right   . Urinary frequency     Past Surgical History:  Procedure Laterality Date  . hydrocelectomy    . IR FLUORO GUIDE PORT INSERTION RIGHT  04/11/2017  . IR REMOVAL TUN ACCESS W/ PORT W/O FL MOD SED  05/29/2018  . PILONIDAL CYST EXCISION      There were no vitals filed for this visit.  Subjective Assessment - 03/01/20 1153    Subjective  Patient reports some soreness after last session; He reports doing HEP and states that he feels like he is making a slight improvement;    Patient is accompained by:  Family member    Pertinent History  Patient is a pleasant 69 year old male with a PMH of arthritis, DM type II, dyslipidemia, gout, cancer; non hodgkins lymphoma, chronic prostatitis, cirrhosis of the liver, HTN, psoriasis, and reflux. Patient presents for balance and walking. Patient has been  weakening and wants to be as active as hee can be. Has a brand new motorcycle and wants to help working at home. Wife and patient got laid off last year.  Right eye cataracts will be corrected soon. Weakness began in 2018, has fallen.  Has a cane but does not use it, same with a walker.    Limitations  Standing;Walking;House hold activities    How long can you sit comfortably?  n/a    How long can you stand comfortably?  3 minutes    How long can you walk comfortably?  to the mailbox and back    Patient Stated Goals  to get stronger, walk better, get back to doing his normal tasks.    Currently in Pain?  No/denies    Pain Onset  More than a month ago    Multiple Pain Sites  No            TREATMENT: Warm up on Nustep BUE/BLE level 2 x4 min (Unbilled);    NMR:   Standing with CGA next to support surface:  Airex pad: static stand 30 seconds x 1 rep with feet close together, minimal sway noted;  Progressed to head turns side/side x5 reps  Progressed  to head turns up/down x5 reps  Required min A and min VCS for gaze stabilization for better stance control;  Airex pad:6" step toe taps 2-1 UE support 15x each LE Airex pad: one foot on 6" step one foot on airex pad,   Static standing 10 sec hold x1 rep each  Progressed to BUE ball pass side/side x5 reps each foot on step;  Resisted walking 12.5# forward/backward, side step (2 way) x2 laps each with min A for safety;  Exercise:  Standing with BUE support: tactile cueing for upright posture, 2# ankle weights:  -hip extension 10x each LE, mod VCs for knee extension for better hip strengthening;  -hip abduction 10x each LE; mod cueing for keeping toes neutral alignment -heel raises 10x -toe raises, cue not to rock 10x -alternate marches x10 reps;  -Side stepping x10 feet x2 laps each direction;      Patient requires frequent seated rest breaks between interventions due to fatigue.    Pt educated throughout session about proper  posture and technique with exercises. Improved exercise technique, movement at target joints, use of target muscles after min to mod verbal, visual, tactile cues.                   PT Education - 03/01/20 1142    Education Details  exercise technique, body mechanics, HEP    Person(s) Educated  Patient    Methods  Explanation;Verbal cues    Comprehension  Verbalized understanding;Returned demonstration;Verbal cues required;Need further instruction       PT Short Term Goals - 02/21/20 1634      PT SHORT TERM GOAL #1   Title  Patient will be independent in home exercise program to improve strength/mobility for better functional independence with ADLs.    Baseline  5/24: HEP given    Time  2    Period  Weeks    Status  New    Target Date  03/06/20        PT Long Term Goals - 02/21/20 1635      PT LONG TERM GOAL #1   Title  Patient will increase FOTO score to equal to or greater than 57/100  to demonstrate statistically significant improvement in mobility and quality of life.    Baseline  5/24: 45/100    Time  8    Period  Weeks    Status  New    Target Date  04/17/20      PT LONG TERM GOAL #2   Title  Patient will increase Berg Balance score by > 6 points (35/56) to demonstrate decreased fall risk during functional activities.    Baseline  5/24: 29/56    Time  8    Period  Weeks    Status  New    Target Date  04/17/20      PT LONG TERM GOAL #3   Title  Patient will increase 10 meter walk test to >1.53m/s as to improve gait speed for better community ambulation and to reduce fall risk.    Baseline  5/24: 0.632 m/s    Time  8    Period  Weeks    Status  New    Target Date  04/17/20      PT LONG TERM GOAL #4   Title  Patient will increase BLE gross strength to 4+/5 as to improve functional strength for independent gait, increased standing tolerance and increased ADL ability    Baseline  5/24: see  note    Time  8    Period  Weeks    Status  New     Target Date  04/17/20            Plan - 03/01/20 1250    Clinical Impression Statement  Patient motivated and participated well within session. He was instructed in advanced LE strengthening, adding ankle weights for increased resistance. Patient required min VCs for proper exercise technique for optimal muscle activation. Instructed patient in advanced balance exercise utilizing compliant surface to challenge stance control. Patient does require min A for advanced balance exercise with reduced rail assist. Patient would benefit from additional skilled PT intervention to improve strength, balance and gait safety;    Personal Factors and Comorbidities  Age;Comorbidity 3+    Comorbidities  arthritis, DM type II, dyslipidemia, gout, cancer; non hodgkins lymphoma, chronic prostatitis, cirrhosis of the liver, HTN, psoriasis, and reflux.    Examination-Activity Limitations  Bed Mobility;Bend;Caring for [employer];Locomotion Level;Lift;Stand;Toileting;Transfers    Examination-Participation Restrictions  Church;Cleaning;Community Activity;Driving;Interpersonal Relationship;Laundry;Volunteer;Shop;Meal Prep;Yard Work    [employer]chant [employer] officer  Evolving/Moderate complexity    Rehab Potential  Fair    PT Frequency  2x / week    PT Duration  8 weeks    PT Treatment/Interventions  ADLs/Self Care Home Management;Aquatic Therapy;Biofeedback;Canalith Repostioning;Cryotherapy;Electrical Stimulation;Iontophoresis 4mg /ml Dexamethasone;Moist Heat;Traction;Ultrasound;DME Instruction;Gait training;Stair training;Functional mobility training;Therapeutic activities;Patient/family education;Neuromuscular re-education;Balance training;Therapeutic exercise;Orthotic Fit/Training;Manual techniques;Splinting;Energy conservation;Dry needling;Passive range of motion;Compression bandaging;Taping;Vasopneumatic Device;Vestibular;Visual/perceptual remediation/compensation    PT Next  Visit Plan  review HEP, posture, LE strength, balance    PT Home Exercise Plan  see above    Consulted and Agree with Plan of Care  Patient       Patient will benefit from skilled therapeutic intervention in order to improve the following deficits and impairments:  Abnormal gait, Decreased activity tolerance, Decreased balance, Decreased endurance, Decreased coordination, Decreased knowledge of use of DME, Decreased mobility, Decreased range of motion, Difficulty walking, Decreased strength, Impaired flexibility, Impaired perceived functional ability, Increased muscle spasms, Impaired vision/preception, Obesity, Postural dysfunction, Improper body mechanics, Impaired sensation, Pain  Visit Diagnosis: Muscle weakness (generalized)  Other abnormalities of gait and mobility     Problem List Patient Active Problem List   Diagnosis Date Noted  . Goals of care, counseling/discussion 04/08/2017  . 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 ([place]) 04/08/2017  . Ascites 02/08/2017  . Liver cirrhosis ([place]) 02/08/2017  . SBP (spontaneous bacterial peritonitis) ([place]) 02/07/2017  . 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  . DOE (dyspnea on exertion) 11/15/2016  . Obesity, morbid (more than 100 lbs over ideal weight or BMI > 40) ([place]) 11/15/2016  . Elevated PSA 05/30/2015  . BPH with obstruction/lower urinary tract symptoms 05/30/2015  . Type 2 diabetes mellitus ([place]) 05/18/2014  . Arthritis 01/03/2014  . Dyslipidemia 01/03/2014  . Benign essential hypertension 01/03/2014  . Gout 01/03/2014  . Psoriasis 01/03/2014  . Reflux 01/03/2014    Brodrick Curran PT, DPT 03/01/2020, 12:57 PM  [place] MAIN [facility] SERVICES [address] [place], [place], [address] Phone: [phone]   Fax:  [phone]  Name: Peter Ryan MRN: [id number] Date of Birth: 09/10/1951

## 2020-03-01 NOTE — Patient Instructions (Signed)
Access Code: [email or web address] URL: [email or web address] Date: 03/01/2020 Prepared by: Blanche East  Exercises Side Stepping with Counter Support - 1 x daily - 7 x weekly - 2 sets - 5 reps Backward Walking with Counter Support - 1 x daily - 7 x weekly - 2 sets - 5 reps

## 2020-03-06 ENCOUNTER — Ambulatory Visit: Payer: Medicare Other | Admitting: Physical Therapy

## 2020-03-08 ENCOUNTER — Ambulatory Visit: Payer: Medicare Other | Admitting: Physical Therapy

## 2020-03-08 DIAGNOSIS — H2511 Age-related nuclear cataract, right eye: Secondary | ICD-10-CM | POA: Diagnosis not present

## 2020-03-14 ENCOUNTER — Ambulatory Visit: Payer: Medicare Other | Admitting: Physical Therapy

## 2020-03-16 ENCOUNTER — Ambulatory Visit: Payer: Medicare Other | Admitting: Physical Therapy

## 2020-03-20 ENCOUNTER — Other Ambulatory Visit: Payer: Self-pay | Admitting: Family Medicine

## 2020-03-20 ENCOUNTER — Ambulatory Visit: Payer: Medicare Other | Admitting: Physical Therapy

## 2020-03-20 DIAGNOSIS — C8333 Diffuse large B-cell lymphoma, intra-abdominal lymph nodes: Secondary | ICD-10-CM

## 2020-03-22 ENCOUNTER — Ambulatory Visit: Payer: Medicare Other | Admitting: Physical Therapy

## 2020-03-23 ENCOUNTER — Encounter: Payer: Self-pay | Admitting: Family Medicine

## 2020-03-27 ENCOUNTER — Ambulatory Visit: Payer: Medicare Other | Admitting: Physical Therapy

## 2020-03-28 ENCOUNTER — Other Ambulatory Visit: Payer: Self-pay | Admitting: Family Medicine

## 2020-03-28 DIAGNOSIS — I10 Essential (primary) hypertension: Secondary | ICD-10-CM

## 2020-03-29 ENCOUNTER — Ambulatory Visit: Payer: Medicare Other | Admitting: Physical Therapy

## 2020-05-02 ENCOUNTER — Other Ambulatory Visit: Payer: Self-pay

## 2020-05-02 ENCOUNTER — Ambulatory Visit (INDEPENDENT_AMBULATORY_CARE_PROVIDER_SITE_OTHER): Payer: Medicare Other | Admitting: Family Medicine

## 2020-05-02 DIAGNOSIS — I700 Atherosclerosis of aorta: Secondary | ICD-10-CM | POA: Diagnosis not present

## 2020-05-02 DIAGNOSIS — G609 Hereditary and idiopathic neuropathy, unspecified: Secondary | ICD-10-CM | POA: Diagnosis not present

## 2020-05-02 DIAGNOSIS — R972 Elevated prostate specific antigen [PSA]: Secondary | ICD-10-CM | POA: Diagnosis not present

## 2020-05-02 DIAGNOSIS — I10 Essential (primary) hypertension: Secondary | ICD-10-CM | POA: Diagnosis not present

## 2020-05-02 DIAGNOSIS — E119 Type 2 diabetes mellitus without complications: Secondary | ICD-10-CM

## 2020-05-02 DIAGNOSIS — K7460 Unspecified cirrhosis of liver: Secondary | ICD-10-CM

## 2020-05-02 DIAGNOSIS — E785 Hyperlipidemia, unspecified: Secondary | ICD-10-CM

## 2020-05-02 DIAGNOSIS — C786 Secondary malignant neoplasm of retroperitoneum and peritoneum: Secondary | ICD-10-CM | POA: Insufficient documentation

## 2020-05-02 NOTE — Progress Notes (Signed)
Established patient visit   Patient: Peter Ryan   DOB: 1951-02-22   69 y.o. Male  MRN: [id number] Visit Date: 05/02/2020  Today's healthcare provider: Wilhemena Durie, MD   No chief complaint on file.  Subjective    HPI  The patient is a 69 year old male who presents for follow up of chronic health issues. He wants to lose weight but is not doing well with diet and exercise. He is awaiting EMG by Dr. Manuella Ghazi for neuropathy. He states he is taking his medications as prescribed. He has been in remission from his lymphoma for a couple of years.  Medications: Outpatient Medications Prior to Visit  Medication Sig  . allopurinol (ZYLOPRIM) 300 MG tablet TAKE ONE TABLET EVERY DAY  . amLODipine (NORVASC) 10 MG tablet TAKE ONE TABLET BY MOUTH EVERY DAY  . aspirin EC 81 MG tablet Take 81 mg by mouth daily.   . Cholecalciferol (VITAMIN D3) 1000 UNITS CAPS Take 1,000 Units by mouth daily.   . finasteride (PROSCAR) 5 MG tablet Take 1 tablet (5 mg total) by mouth daily.  . hydrocortisone 2.5 % lotion APPLY A SMALL AMOUNT TO AFFECTED AREA 3 TIMES A WEEK  . ketoconazole (NIZORAL) 2 % shampoo SHAMPOO WITH A SMALL AMOUNT AS DIRECTED EVERY OTHER DAY TO DAILY, WASH FACE AND SCALP, LET SIT SEVERAL MINUTES AND RINSE OFF  . losartan (COZAAR) 100 MG tablet TAKE ONE TABLET EVERY DAY BY MOUTH  . rosuvastatin (CRESTOR) 10 MG tablet Take 1 tablet (10 mg total) by mouth daily. (Patient not taking: Reported on 01/04/2020)  . [DISCONTINUED] naproxen (NAPROSYN) 500 MG tablet Take 1 tablet (500 mg total) by mouth 2 (two) times daily as needed. (Patient not taking: Reported on 11/22/2019)   Facility-Administered Medications Prior to Visit  Medication Dose Route Frequency Provider  . sodium chloride flush (NS) 0.9 % injection 10 mL  10 mL Intravenous PRN Sindy Guadeloupe, MD    Review of Systems  Constitutional: Negative.   HENT: Positive for tinnitus.   Eyes: Negative.   Respiratory: Negative.     Cardiovascular: Negative.   Gastrointestinal: Negative.   Endocrine: Negative.   Genitourinary: Negative.   Musculoskeletal: Positive for gait problem.  Skin: Negative.   Allergic/Immunologic: Negative.   Hematological: Negative.   Psychiatric/Behavioral: The patient is nervous/anxious.     Last hemoglobin A1c Lab Results  Component Value Date   HGBA1C 8.4 (H) 11/22/2019      Objective    BP 138/74 (BP Location: Right Arm, Patient Position: Sitting, Cuff Size: Large)   Pulse (!) 110   Temp 98.3 F (36.8 C) (Oral)   Wt 286 lb (129.7 kg)   SpO2 96%   BMI 41.04 kg/m  BP Readings from Last 3 Encounters:  05/02/20 138/74  01/04/20 139/77  11/22/19 129/80   Wt Readings from Last 3 Encounters:  05/02/20 286 lb (129.7 kg)  01/04/20 275 lb 4.8 oz (124.9 kg)  11/22/19 279 lb (126.6 kg)      Physical Exam Constitutional:      Appearance: He is obese.  HENT:     Head: Normocephalic and atraumatic.     Right Ear: Tympanic membrane, ear canal and external ear normal.     Left Ear: Tympanic membrane, ear canal and external ear normal.     Nose: Nose normal.     Mouth/Throat:     Mouth: Mucous membranes are moist.     Pharynx: Oropharynx is clear.  Eyes:  Extraocular Movements: Extraocular movements intact.     Conjunctiva/sclera: Conjunctivae normal.     Pupils: Pupils are equal, round, and reactive to light.  Cardiovascular:     Rate and Rhythm: Normal rate and regular rhythm.     Pulses: Normal pulses.     Heart sounds: Normal heart sounds.  Pulmonary:     Effort: Pulmonary effort is normal.     Breath sounds: Normal breath sounds.  Abdominal:     General: Bowel sounds are normal.     Palpations: Abdomen is soft.  Genitourinary:    Penis: Normal.      Testes: Normal.     Comments: Deferred to urology Musculoskeletal:     Cervical back: Normal range of motion and neck supple.     Comments: Trace to 1+ lower extremity edema  Skin:    General: Skin is warm  and dry.  Neurological:     General: No focal deficit present.     Mental Status: He is alert and oriented to person, place, and time.     Coordination: Coordination abnormal.     Comments: In general he is not very strong or have significantly good coordination when he gets from the chair with my help to the exam table.  Psychiatric:        Mood and Affect: Mood normal.        Behavior: Behavior normal.        Thought Content: Thought content normal.        Judgment: Judgment normal.     Fall Risk  05/02/2020 08/01/2017 04/17/2017 10/12/2015 10/05/2015  Falls in the past year? 1 No No No No  Number falls in past yr: 0 - - - -  Injury with Fall? 0 - - - -   Depression screen [facility] 2/9 05/02/2020 01/29/2018 12/02/2016 08/22/2015  Decreased Interest 0 0 0 0  Down, Depressed, Hopeless 0 0 3 0  PHQ - 2 Score 0 0 3 0  Altered sleeping 0 0 0 -  Tired, decreased energy 0 2 1 -  Change in appetite 0 3 0 -  Feeling bad or failure about yourself  0 0 0 -  Trouble concentrating 0 0 0 -  Moving slowly or fidgety/restless 0 0 0 -  Suicidal thoughts 0 0 0 -  PHQ-9 Score 0 5 4 -  Difficult doing work/chores Not difficult at all Not difficult at all - -   Functional Status Survey: Is the patient deaf or have difficulty hearing?: No Does the patient have difficulty seeing, even when wearing glasses/contacts?: No Does the patient have difficulty concentrating, remembering, or making decisions?: No Does the patient have difficulty walking or climbing stairs?: Yes Does the patient have difficulty dressing or bathing?: No Does the patient have difficulty doing errands alone such as visiting a doctor's office or shopping?: No   [facility] Visit from 05/02/2020 in [facility]  AUDIT-C Score 1      No results found for any visits on 05/02/20.  Assessment & Plan     1. Obesity, morbid (more than 100 lbs over ideal weight or BMI > 40) (HCC) Diet and exercise with needed weight loss discussed  again. - Comprehensive metabolic panel - Lipid Panel With LDL/HDL Ratio - TSH  2. Benign essential hypertension  - CBC with Differential/Platelet - TSH  3. Cirrhosis of liver without ascites, unspecified hepatic cirrhosis type (HCC) NASH - Comprehensive metabolic panel  4. Dyslipidemia  - Lipid Panel With  LDL/HDL Ratio  5. Elevated PSA Followed by urology  6. Type 2 diabetes mellitus without complication, without long-term current use of insulin (HCC) Goal A1c less than 7 - Comprehensive metabolic panel - Hemoglobin A1c - Lipid Panel With LDL/HDL Ratio  7. Idiopathic peripheral neuropathy Work-up underway by neurology  8. Secondary malignant neoplasm of retroperitoneum and peritoneum ([place]) Followed by oncology  9. Atherosclerosis of aorta ([place]) All risk factors treated   No follow-ups on file.         Travin Marik Cranford Mon, MD  [facility] [phone] (phone) [phone] (fax)  [place]

## 2020-05-09 DIAGNOSIS — E785 Hyperlipidemia, unspecified: Secondary | ICD-10-CM | POA: Diagnosis not present

## 2020-05-09 DIAGNOSIS — E119 Type 2 diabetes mellitus without complications: Secondary | ICD-10-CM | POA: Diagnosis not present

## 2020-05-09 DIAGNOSIS — K7460 Unspecified cirrhosis of liver: Secondary | ICD-10-CM | POA: Diagnosis not present

## 2020-05-09 DIAGNOSIS — I10 Essential (primary) hypertension: Secondary | ICD-10-CM | POA: Diagnosis not present

## 2020-05-10 LAB — CBC WITH DIFFERENTIAL/PLATELET
Basophils Absolute: 0.1 10*3/uL (ref 0.0–0.2)
Basos: 1 %
EOS (ABSOLUTE): 0.2 10*3/uL (ref 0.0–0.4)
Eos: 2 %
Hematocrit: 46.5 % (ref 37.5–51.0)
Hemoglobin: 15.8 g/dL (ref 13.0–17.7)
Immature Grans (Abs): 0 10*3/uL (ref 0.0–0.1)
Immature Granulocytes: 0 %
Lymphocytes Absolute: 1.7 10*3/uL (ref 0.7–3.1)
Lymphs: 23 %
MCH: 29.8 pg (ref 26.6–33.0)
MCHC: 34 g/dL (ref 31.5–35.7)
MCV: 88 fL (ref 79–97)
Monocytes Absolute: 0.6 10*3/uL (ref 0.1–0.9)
Monocytes: 8 %
Neutrophils Absolute: 4.9 10*3/uL (ref 1.4–7.0)
Neutrophils: 66 %
Platelets: 204 10*3/uL (ref 150–450)
RBC: 5.31 x10E6/uL (ref 4.14–5.80)
RDW: 12.9 % (ref 11.6–15.4)
WBC: 7.4 10*3/uL (ref 3.4–10.8)

## 2020-05-10 LAB — COMPREHENSIVE METABOLIC PANEL
ALT: 17 IU/L (ref 0–44)
AST: 11 IU/L (ref 0–40)
Albumin/Globulin Ratio: 1.8 (ref 1.2–2.2)
Albumin: 4.4 g/dL (ref 3.8–4.8)
Alkaline Phosphatase: 75 IU/L (ref 48–121)
BUN/Creatinine Ratio: 15 (ref 10–24)
BUN: 15 mg/dL (ref 8–27)
Bilirubin Total: 0.4 mg/dL (ref 0.0–1.2)
CO2: 25 mmol/L (ref 20–29)
Calcium: 9.4 mg/dL (ref 8.6–10.2)
Chloride: 102 mmol/L (ref 96–106)
Creatinine, Ser: 0.98 mg/dL (ref 0.76–1.27)
GFR calc Af Amer: 92 mL/min/{1.73_m2} (ref >59)
GFR calc non Af Amer: 79 mL/min/{1.73_m2} (ref >59)
Globulin, Total: 2.5 g/dL (ref 1.5–4.5)
Glucose: 206 mg/dL — ABNORMAL HIGH (ref 65–99)
Potassium: 4 mmol/L (ref 3.5–5.2)
Sodium: 146 mmol/L — ABNORMAL HIGH (ref 134–144)
Total Protein: 6.9 g/dL (ref 6.0–8.5)

## 2020-05-10 LAB — TSH: TSH: 1.17 u[IU]/mL (ref 0.450–4.500)

## 2020-05-10 LAB — LIPID PANEL WITH LDL/HDL RATIO
Cholesterol, Total: 207 mg/dL — ABNORMAL HIGH (ref 100–199)
HDL: 30 mg/dL — ABNORMAL LOW (ref >39)
LDL Chol Calc (NIH): 94 mg/dL (ref 0–99)
LDL/HDL Ratio: 3.1 ratio (ref 0.0–3.6)
Triglycerides: 499 mg/dL — ABNORMAL HIGH (ref 0–149)
VLDL Cholesterol Cal: 83 mg/dL — ABNORMAL HIGH (ref 5–40)

## 2020-05-10 LAB — HEMOGLOBIN A1C
Est. average glucose Bld gHb Est-mCnc: 171 mg/dL
Hgb A1c MFr Bld: 7.6 % — ABNORMAL HIGH (ref 4.8–5.6)

## 2020-05-12 ENCOUNTER — Other Ambulatory Visit: Payer: Self-pay

## 2020-05-12 ENCOUNTER — Ambulatory Visit: Payer: Medicare Other | Attending: Neurology

## 2020-05-12 DIAGNOSIS — R2681 Unsteadiness on feet: Secondary | ICD-10-CM | POA: Insufficient documentation

## 2020-05-12 DIAGNOSIS — M6281 Muscle weakness (generalized): Secondary | ICD-10-CM | POA: Diagnosis not present

## 2020-05-12 DIAGNOSIS — R2689 Other abnormalities of gait and mobility: Secondary | ICD-10-CM | POA: Insufficient documentation

## 2020-05-12 NOTE — Patient Instructions (Signed)
Access Code: [email or web address] URL: [email or web address] Date: 05/12/2020 Prepared by: Janna Arch  Exercises  Standing March with Counter Support - 1 x daily - 7 x weekly - 2 sets - 10 reps - 3 hold  Standing Romberg to 1/2 Tandem Stance - 1 x daily - 7 x weekly - 2 sets - 30 s hold  Heel Toe Raises with Counter Support - 1 x daily - 7 x weekly - 2 sets - 10 reps - 3 hold  Standing Hip Extension with Counter Support - 1 x daily - 7 x weekly - 2 sets - 10 reps - 3 hold

## 2020-05-12 NOTE — Therapy (Signed)
[place] MAIN [facility] SERVICES [address] [place], [place], [address] Phone: [phone]   Fax:  [phone]  Physical Therapy Evaluation  Patient Details  Name: Peter Ryan MRN: [id number] Date of Birth: [birth year] Referring Provider (PT): Vladimir Crofts, MD   Encounter Date: 05/12/2020   PT End of Session - 05/12/20 1201    Visit Number 1    Number of Visits 16    Date for PT Re-Evaluation 07/07/20    Authorization Type Eval: 05/12/20    PT Start Time 1017    PT Stop Time 1112    PT Time Calculation (min) 55 min    Equipment Utilized During Treatment Gait belt    Activity Tolerance Patient tolerated treatment well    Behavior During Therapy [facility] for tasks assessed/performed           Past Medical History:  Diagnosis Date  . Arthritis   . BPH (benign prostatic hyperplasia)   . Cancer ([place])    Non-Hodgkins lymphoma  . Chronic prostatitis   . Cirrhosis of liver ([place])   . Dyslipidemia   . Elevated PSA   . Gastric reflux   . Gout   . HTN (hypertension)   . Over weight   . Psoriasis   . Testicular pain, right   . Urinary frequency     Past Surgical History:  Procedure Laterality Date  . hydrocelectomy    . IR FLUORO GUIDE PORT INSERTION RIGHT  04/11/2017  . IR REMOVAL TUN ACCESS W/ PORT W/O FL MOD SED  05/29/2018  . PILONIDAL CYST EXCISION      There were no vitals filed for this visit.    Subjective Assessment - 05/12/20 1137    Subjective Imbalance/unsteadiness on feet    Patient is accompained by: Family member    Pertinent History Patient presents for balance and walking. He was coming into PT before his right cataract surgery on 03/08/20 and now is returning. He has a history of of arthritis, DM type II, dyslipidemia, gout, cancer; non hodgkins lymphoma, chronic prostatitis, cirrhosis of the liver, HTN, psoriasis, and reflux. He has chronic knee and ankle pain, but reports no numbness/tingling. His goals are to  ride his motorcycle and walk with better balance. He has not had any falls since his surgery. He does not walk with any assistive device but has a cane and walker available.    Limitations Standing;Walking;House hold activities    How long can you sit comfortably? n/a    How long can you stand comfortably? --    How long can you walk comfortably? --    Patient Stated Goals Walk with better balance    Currently in Pain? No/denies    Pain Onset More than a month ago              [facility] PT Assessment - 05/12/20 1045      Assessment   Medical Diagnosis Imbalance    Referring Provider (PT) Vladimir Crofts, MD    Onset Date/Surgical Date 03/08/20   Cataract Surgery   Hand Dominance Right    Prior Therapy Yes      Precautions   Precautions Fall      Restrictions   Weight Bearing Restrictions No      Balance Screen   Has the patient fallen in the past 6 months --   No falls since Cataract surgery     [place] residence  Living Arrangements Spouse/significant other    Type of [place] to enter    Entrance Stairs-Number of Steps 4-5    Entrance Stairs-Rails Right    Home Layout One level    Home Equipment Grab bars - toilet;Grab bars - tub/shower      Prior Function   Leisure Motorcycle      Standardized Balance Assessment   Standardized Balance Assessment Berg Balance Test;Timed Up and Go Test;Five Times Sit to Stand;10 meter walk test    Five times sit to stand comments  14.70   with BUE support   10 Meter Walk Self-selected: 0.647 m/s; Fastest: 0.72 m/s      Berg Balance Test   Sit to Stand Able to stand  independently using hands    Standing Unsupported Able to stand safely 2 minutes    Sitting with Back Unsupported but Feet Supported on Floor or Stool Able to sit safely and securely 2 minutes    Stand to Sit Controls descent by using hands    Transfers Able to transfer safely, definite need of hands     Standing Unsupported with Eyes Closed Able to stand 10 seconds safely    Standing Unsupported with Feet Together Able to place feet together independently and stand 1 minute safely    From Standing, Reach Forward with Outstretched Arm Can reach forward >5 cm safely (2")    From Standing Position, Pick up Object from Floor Able to pick up shoe, needs supervision    From Standing Position, Turn to Look Behind Over each Shoulder Looks behind one side only/other side shows less weight shift    Turn 360 Degrees Able to turn 360 degrees safely one side only in 4 seconds or less    Standing Unsupported, Alternately Place Feet on Step/Stool Able to stand independently and complete 8 steps >20 seconds    Standing Unsupported, One Foot in Front Able to take small step independently and hold 30 seconds    Standing on One Leg Unable to try or needs assist to prevent fall    Total Score 41      Timed Up and Go Test   TUG Normal TUG    Normal TUG (seconds) 14.7            SUBJECTIVE Chief complaint: Patient presents for balance and walking. He was coming into PT before his right cataract surgery on 03/08/20 and now is returning. He has a history of of arthritis, DM type II, dyslipidemia, gout, cancer; non hodgkins lymphoma, chronic prostatitis, cirrhosis of the liver, HTN, psoriasis, and reflux. He has chronic knee and ankle pain, but reports no numbness/tingling. His goals are to ride his motorcycle and walk with better balance. He has not had any falls since his surgery. He does not walk with any assistive device but has a cane and walker available.   Recent changes in overall health/medication: No Prior history of physical therapy for balance: Yes, prior to surgery 06/21. Follow-up appointment with MD: None scheduled Red flags (bowel/bladder changes, saddle paresthesia, personal history of cancer, chills/fever, night sweats, unrelenting pain) Negative   OBJECTIVE  MUSCULOSKELETAL: Tremor:  Absent Bulk: Normal Tone: Normal, no clonus  Posture He has a forward head with rounded shoulders posture in sitting and standing. He also sits with heels together and toes pointed out.   Gait Patient ambulates with small shuffle steps, forward trunk lean, limited hip extension and arm swing bilaterally with arms not  reaching past hips.   Strength R/L 5/4+ Hip flexion 5/5 Knee extension 4/4+ Knee flexion 5/4 Ankle Plantarflexion 5/4 Ankle Dorsiflexion 4+/4 Ankle Inversion 4+/5 Ankle Eversion UE strength WFL   NEUROLOGICAL:  Mental Status Patient is oriented to person, place and time.  Recent memory is intact.  Remote memory is intact.  Attention span and concentration are intact.  Expressive speech is intact.  Patient's fund of knowledge is within normal limits for educational level.  Cranial Nerves Visual acuity and visual fields are intact  Extraocular muscles are intact  Facial sensation is intact bilaterally  Facial strength is intact bilaterally  Hearing is normal as tested by gross conversation   Sensation Grossly intact to light touch bilateral UEs/LEs as determined by testing dermatomes C2-T2/L2-S2 respectively Proprioception and hot/cold testing deferred on this date  Coordination/Cerebellar Finger to Nose: Dysmetric with L finger Rapid alternating movements: WNL Finger Opposition: WNL   FUNCTIONAL OUTCOME MEASURES   Results Comments  BERG 45/56 Fall risk, in need of intervention  TUG 14.70 seconds Fall risk, in need of intervention  5TSTS 11.31 seconds BUE support used  10 Meter Gait Speed Self-selected: 15.46 s = 0.647 m/s; Fastest: 13.88 s = 0.72 m/s Below normative values for full community ambulation  ABC Scale 57.5%   FOTO 49 Predicted value: 61    POSTURAL CONTROL TESTS   Modified Clinical Test of Sensory Interaction for Balance (CTSIB): Deferred   ASSESSMENT Clinical Impression: Pt is a pleasant 69 year-old male referred for  difficulty with balance. He is reliant upon his UE's for transfers but does ambulate at this time without an AD. His vision has improved and all extraocular muscles, vision acuity, and visual fields are intact. However, finger to nose coordination on L was dysmetric. During balance activities and walking, he shifts his weight to the side where he feels the most supportive. His gait mechanics are poor with forward center of mass and limited foot clearance bilaterally. His balance is limited by single limb stability. Performed the BERG, TUG, 5TSTS (with BUE support), 10MWT, and ABC scale. All scores indicate a fall risk and his gait speed is below normative community ambulation. Pt presents with deficits in strength, gait, and balance. Pt will benefit from skilled PT services to address deficits in balance and decrease risk for future falls.    Performed HEP below:  Exercises Standing March with Counter Support - 1 x daily - 7 x weekly - 2 sets - 10 reps - 3 hold  Standing Romberg to 1/2 Tandem Stance - 1 x daily - 7 x weekly - 2 sets - 30 s hold  Heel Toe Raises with Counter Support - 1 x daily - 7 x weekly - 2 sets - 10 reps - 3 hold  Standing Hip Extension with Counter Support - 1 x daily - 7 x weekly - 2 sets - 10 reps - 3 hold   Objective measurements completed on examination: See above findings.         PT Education - 05/12/20 1222    Education Details POC, goals, HEP    Person(s) Educated Patient;Spouse    Methods Explanation;Demonstration;Tactile cues;Verbal cues;Handout    Comprehension Verbalized understanding;Returned demonstration;Verbal cues required;Tactile cues required            PT Short Term Goals - 05/12/20 1205      PT SHORT TERM GOAL #1   Title Patient will be independent in home exercise program to improve strength/mobility for better functional independence with  ADLs.    Baseline 05/12/20: HEP given    Time 4    Period Weeks    Status New    Target Date  06/09/20      PT SHORT TERM GOAL #2   Title Patient will be able to complete sit to stands without use of BUE support.    Baseline 05/12/20: Patient dependent on BUE support    Time 4    Period Weeks    Status New    Target Date 06/09/20             PT Long Term Goals - 05/12/20 1207      PT LONG TERM GOAL #1   Title Patient will increase FOTO score to equal to or greater than 61/100  to demonstrate statistically significant improvement in mobility and quality of life.    Baseline 05/12/20: 49    Time 8    Period Weeks    Status New    Target Date 07/07/20      PT LONG TERM GOAL #2   Title Patient will increase Berg Balance score by at least 3 points to demonstrate clinically significant improvement in balance and decreased risk for falls.    Baseline 05/12/20: 41    Time 8    Period Weeks    Status New    Target Date 07/07/20      PT LONG TERM GOAL #3   Title Patient will increase 10 meter walk test to >1.38m/s as to improve gait speed for better community ambulation and to reduce fall risk.    Baseline 05/12/20: Self-selected: 15.46 s = 0.647 m/s, Fastest: 13.88 s = 0.72 m/s;    Time 8    Period Weeks    Status New    Target Date 07/07/20      PT LONG TERM GOAL #4   Title Pt will improve ABC by at least 13% in order to demonstrate clinically significant improvement in balance confidence.    Baseline 05/12/20: 57.5%    Time 8    Period Weeks    Status New    Target Date 07/07/20      PT LONG TERM GOAL #5   Title Pt 5TSTS time will be less than 15 secs with no UE support in order to decrease risk for falls.    Baseline 05/12/20: 11.31 secs with BUE support    Time 8    Period Weeks    Status New    Target Date 07/07/20                  Plan - 05/12/20 1202    Clinical Impression Statement Pt is a pleasant 69 year-old male referred for difficulty with balance. He is reliant upon his UE's for transfers but does ambulate at this time without an AD. His  vision has improved and all extraocular muscles, vision acuity, and visual fields are intact. However, finger to nose coordination on L was dysmetric. During balance activities and walking, he shifts his weight to the side where he feels the most supportive. His gait mechanics are poor with forward center of mass and limited foot clearance bilaterally. His balance is limited by single limb stability. Performed the BERG, TUG, 5TSTS (with BUE support), 10MWT, and ABC scale. All scores indicate a fall risk and his gait speed is below normative community ambulation. Pt presents with deficits in strength, gait, and balance. Pt will benefit from skilled PT services to address deficits in balance  and decrease risk for future falls.    Personal Factors and Comorbidities Comorbidity 3+;Time since onset of injury/illness/exacerbation    Comorbidities arthritis, DM type II, dyslipidemia, gout, cancer; non hodgkins lymphoma, chronic prostatitis, cirrhosis of the liver, HTN, psoriasis, and reflux.    Examination-Activity Limitations Bed Mobility;Locomotion Level;Stairs;Squat;Stand;Transfers    Examination-Participation Restrictions Community Activity;Yard Work;Interpersonal Relationship;Cleaning    Stability/Clinical Decision Making Evolving/Moderate complexity    Clinical Decision Making Moderate    Rehab Potential Fair    PT Frequency 2x / week    PT Duration 8 weeks    PT Treatment/Interventions ADLs/Self Care Home Management;Aquatic Therapy;Biofeedback;Canalith Repostioning;Cryotherapy;Electrical Stimulation;Traction;Ultrasound;DME Instruction;Gait training;Stair training;Functional mobility training;Therapeutic activities;Patient/family education;Neuromuscular re-education;Balance training;Therapeutic exercise;Orthotic Fit/Training;Manual techniques;Splinting;Energy conservation;Dry needling;Passive range of motion;Compression bandaging;Taping;Vestibular;Joint Manipulations;Spinal Manipulations;Moist Heat    PT  Next Visit Plan review HEP, posture, LE strength, balance    PT Home Exercise Plan [facility]bridge: [email or web address]    Consulted and Agree with Plan of Care Patient;Family member/caregiver           Patient will benefit from skilled therapeutic intervention in order to improve the following deficits and impairments:  Abnormal gait, Decreased balance, Decreased coordination, Decreased strength, Difficulty walking, Impaired perceived functional ability, Impaired flexibility, Postural dysfunction, Improper body mechanics  Visit Diagnosis: Unsteadiness on feet  Other abnormalities of gait and mobility     Problem List Patient Active Problem List   Diagnosis Date Noted  . Secondary malignant neoplasm of retroperitoneum and peritoneum ([place]) 05/02/2020  . Atherosclerosis of aorta ([place]) 05/02/2020  . Goals of care, counseling/discussion 04/08/2017  . 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 ([place]) 04/08/2017  . Ascites 02/08/2017  . Liver cirrhosis ([place]) 02/08/2017  . SBP (spontaneous bacterial peritonitis) ([place]) 02/07/2017  . 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  . DOE (dyspnea on exertion) 11/15/2016  . Obesity, morbid (more than 100 lbs over ideal weight or BMI > 40) ([place]) 11/15/2016  . Elevated PSA 05/30/2015  . BPH with obstruction/lower urinary tract symptoms 05/30/2015  . Type 2 diabetes mellitus ([place]) 05/18/2014  . Arthritis 01/03/2014  . Dyslipidemia 01/03/2014  . Benign essential hypertension 01/03/2014  . Gout 01/03/2014  . Psoriasis 01/03/2014  . Reflux 01/03/2014    Noemi Chapel, SPT This entire session was performed under direct supervision and direction of a licensed therapist/therapist assistant . I have personally read, edited and approve of the note as written.  Janna Arch, PT, DPT   05/12/2020, 12:30 PM  [place] MAIN [facility] SERVICES [address] [place], [place], [address] Phone:  [phone]   Fax:  [phone]  Name: Peter Ryan MRN: [id number] Date of Birth: 1951/05/26

## 2020-05-17 ENCOUNTER — Ambulatory Visit: Payer: Medicare Other

## 2020-05-17 ENCOUNTER — Other Ambulatory Visit: Payer: Self-pay

## 2020-05-17 DIAGNOSIS — M6281 Muscle weakness (generalized): Secondary | ICD-10-CM

## 2020-05-17 DIAGNOSIS — R2681 Unsteadiness on feet: Secondary | ICD-10-CM | POA: Diagnosis not present

## 2020-05-17 DIAGNOSIS — R2689 Other abnormalities of gait and mobility: Secondary | ICD-10-CM

## 2020-05-17 NOTE — Therapy (Signed)
[place] MAIN [facility] SERVICES [address] [place], [place], [address] Phone: [phone]   Fax:  [phone]  Physical Therapy Treatment  Patient Details  Name: Peter Ryan MRN: [id number] Date of Birth: 1951-04-13 Referring Provider (PT): Vladimir Crofts, MD   Encounter Date: 05/17/2020   PT End of Session - 05/17/20 0902    Visit Number 2    Number of Visits 16    Date for PT Re-Evaluation 07/07/20    Authorization Type Eval: 05/12/20    PT Start Time 0855    PT Stop Time 0935    PT Time Calculation (min) 40 min    Equipment Utilized During Treatment Gait belt    Activity Tolerance Patient tolerated treatment well;No increased pain    Behavior During Therapy WFL for tasks assessed/performed           Past Medical History:  Diagnosis Date  . Arthritis   . BPH (benign prostatic hyperplasia)   . Cancer ([place])    Non-Hodgkins lymphoma  . Chronic prostatitis   . Cirrhosis of liver ([place])   . Dyslipidemia   . Elevated PSA   . Gastric reflux   . Gout   . HTN (hypertension)   . Over weight   . Psoriasis   . Testicular pain, right   . Urinary frequency     Past Surgical History:  Procedure Laterality Date  . hydrocelectomy    . IR FLUORO GUIDE PORT INSERTION RIGHT  04/11/2017  . IR REMOVAL TUN ACCESS W/ PORT W/O FL MOD SED  05/29/2018  . PILONIDAL CYST EXCISION      There were no vitals filed for this visit.   Subjective Assessment - 05/17/20 0858    Subjective Pt doing well this date, no update since last session. Has tried out his HEP c assist from wife, reports its going good so far.    Patient is accompained by: Family member    Pertinent History Patient presents for balance and walking. He was coming into PT before his right cataract surgery on 03/08/20 and now is returning. He has a history of of arthritis, DM type II, dyslipidemia, gout, cancer; non hodgkins lymphoma, chronic prostatitis, cirrhosis of the liver, HTN,  psoriasis, and reflux. He has chronic knee and ankle pain, but reports no numbness/tingling. His goals are to ride his motorcycle and walk with better balance. He has not had any falls since his surgery. He does not walk with any assistive device but has a cane and walker available.    Currently in Pain? Yes    Pain Score 3    his typical chronic pain   Pain Location Knee          INTERVENTION THIS DATE: -hip extension 10x each LE,  -hip abduction 10x each LE; max cueing for keeping toes neutral alignment -heel raises 10x -toe raises, cue not to rock 10x -Cable Row 1x10 @ 7.5, 1x10 @22 .5lb, 1x10@27 .5  -Seated blue 3000g ball to floor, then overhead x15 -AGILITY LADDER Drillz: forward step-to gait (cues to avoid hitting ladder (helps encourage larger step length)  1x each side; then alternating fwd/side/back/side 2x each direction  -FWD step-ups 1x8 bilat, minguard A (several LOB)  -lateral step ups 1x8 bilat, minguard A (several LOB)        PT Short Term Goals - 05/12/20 1205      PT SHORT TERM GOAL #1   Title Patient will be independent in home exercise program to  improve strength/mobility for better functional independence with ADLs.    Baseline 05/12/20: HEP given    Time 4    Period Weeks    Status New    Target Date 06/09/20      PT SHORT TERM GOAL #2   Title Patient will be able to complete sit to stands without use of BUE support.    Baseline 05/12/20: Patient dependent on BUE support    Time 4    Period Weeks    Status New    Target Date 06/09/20             PT Long Term Goals - 05/12/20 1207      PT LONG TERM GOAL #1   Title Patient will increase FOTO score to equal to or greater than 61/100  to demonstrate statistically significant improvement in mobility and quality of life.    Baseline 05/12/20: 49    Time 8    Period Weeks    Status New    Target Date 07/07/20      PT LONG TERM GOAL #2   Title Patient will increase Berg Balance score by at least  3 points to demonstrate clinically significant improvement in balance and decreased risk for falls.    Baseline 05/12/20: 41    Time 8    Period Weeks    Status New    Target Date 07/07/20      PT LONG TERM GOAL #3   Title Patient will increase 10 meter walk test to >1.54m/s as to improve gait speed for better community ambulation and to reduce fall risk.    Baseline 05/12/20: Self-selected: 15.46 s = 0.647 m/s, Fastest: 13.88 s = 0.72 m/s;    Time 8    Period Weeks    Status New    Target Date 07/07/20      PT LONG TERM GOAL #4   Title Pt will improve ABC by at least 13% in order to demonstrate clinically significant improvement in balance confidence.    Baseline 05/12/20: 57.5%    Time 8    Period Weeks    Status New    Target Date 07/07/20      PT LONG TERM GOAL #5   Title Pt 5TSTS time will be less than 15 secs with no UE support in order to decrease risk for falls.    Baseline 05/12/20: 11.31 secs with BUE support    Time 8    Period Weeks    Status New    Target Date 07/07/20                 Plan - 05/17/20 0912    Clinical Impression Statement Continued with current plan of care, gently progressing patient's program aimed at address deficits and limitations identified in evlauation. Commenced with agility ladder drills to promote great fine motor control of stepping strategies and to promote bigger step length. Also added in step ups to promoted vertical control. Pt continues to make steady progress toward treatment goals in general. Author provides extensive verbal, visual, and tactile cues when needed to assure all interventions are performed with desired form and good accuracy. Extensive communicaiton to assure pt is able to perform all activities without exacerbation of pain or other symptoms.    Personal Factors and Comorbidities Comorbidity 3+;Time since onset of injury/illness/exacerbation    Comorbidities arthritis, DM type II, dyslipidemia, gout, cancer; non  hodgkins lymphoma, chronic prostatitis, cirrhosis of the liver, HTN, psoriasis, and reflux.  Examination-Activity Limitations Bed Mobility;Locomotion Level;Stairs;Squat;Stand;Transfers    Examination-Participation Restrictions Community Activity;Yard Work;Interpersonal Relationship;Cleaning    Stability/Clinical Decision Making Evolving/Moderate complexity    Clinical Decision Making Moderate    Rehab Potential Fair    PT Frequency 2x / week    PT Duration 8 weeks    PT Treatment/Interventions ADLs/Self Care Home Management;Aquatic Therapy;Biofeedback;Canalith Repostioning;Cryotherapy;Electrical Stimulation;Traction;Ultrasound;DME Instruction;Gait training;Stair training;Functional mobility training;Therapeutic activities;Patient/family education;Neuromuscular re-education;Balance training;Therapeutic exercise;Orthotic Fit/Training;Manual techniques;Splinting;Energy conservation;Dry needling;Passive range of motion;Compression bandaging;Taping;Vestibular;Joint Manipulations;Spinal Manipulations;Moist Heat    PT Next Visit Plan review HEP, posture, LE strength, balance    PT Home Exercise Plan Medbridge: [email or web address]    Consulted and Agree with Plan of Care Patient;Family member/caregiver           Patient will benefit from skilled therapeutic intervention in order to improve the following deficits and impairments:  Abnormal gait, Decreased balance, Decreased coordination, Decreased strength, Difficulty walking, Impaired perceived functional ability, Impaired flexibility, Postural dysfunction, Improper body mechanics  Visit Diagnosis: Unsteadiness on feet  Other abnormalities of gait and mobility  Muscle weakness (generalized)     Problem List Patient Active Problem List   Diagnosis Date Noted  . Secondary malignant neoplasm of retroperitoneum and peritoneum ([place]) 05/02/2020  . Atherosclerosis of aorta ([place]) 05/02/2020  . Goals of care, counseling/discussion 04/08/2017  . Diffuse  large B-cell lymphoma of intra-abdominal lymph nodes ([place]) 04/08/2017  . Ascites 02/08/2017  . Liver cirrhosis ([place]) 02/08/2017  . SBP (spontaneous bacterial peritonitis) ([place]) 02/07/2017  . 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  . DOE (dyspnea on exertion) 11/15/2016  . Obesity, morbid (more than 100 lbs over ideal weight or BMI > 40) ([place]) 11/15/2016  . Elevated PSA 05/30/2015  . BPH with obstruction/lower urinary tract symptoms 05/30/2015  . Type 2 diabetes mellitus ([place]) 05/18/2014  . Arthritis 01/03/2014  . Dyslipidemia 01/03/2014  . Benign essential hypertension 01/03/2014  . Gout 01/03/2014  . Psoriasis 01/03/2014  . Reflux 01/03/2014   9:23 AM, 05/17/20 Etta Grandchild, PT, DPT Physical Therapist - [facility]  Outpatient Physical Therapy- [place] [phone]     Etta Grandchild 05/17/2020, 9:13 AM  [place] MAIN [facility] SERVICES [address] [place], [place], [address] Phone: [phone]   Fax:  [phone]  Name: Arjuna Doeden MRN: [id number] Date of Birth: 01-Aug-1951

## 2020-05-30 ENCOUNTER — Ambulatory Visit: Payer: Medicare Other

## 2020-05-30 ENCOUNTER — Other Ambulatory Visit: Payer: Self-pay

## 2020-05-30 DIAGNOSIS — R2681 Unsteadiness on feet: Secondary | ICD-10-CM | POA: Diagnosis not present

## 2020-05-30 DIAGNOSIS — R2689 Other abnormalities of gait and mobility: Secondary | ICD-10-CM

## 2020-05-30 DIAGNOSIS — M6281 Muscle weakness (generalized): Secondary | ICD-10-CM

## 2020-05-30 DIAGNOSIS — K625 Hemorrhage of anus and rectum: Secondary | ICD-10-CM | POA: Insufficient documentation

## 2020-05-30 DIAGNOSIS — Z5321 Procedure and treatment not carried out due to patient leaving prior to being seen by health care provider: Secondary | ICD-10-CM | POA: Insufficient documentation

## 2020-05-30 NOTE — ED Triage Notes (Signed)
Pt states while having a bowel movement today began to have bright red rectal bleeding. Pt states "was drops" of blood. Pt denies dizziness, abd pain, rectal pain.

## 2020-05-30 NOTE — Therapy (Signed)
[place] MAIN [facility] SERVICES [address] [place], [place], [address] Phone: [phone]   Fax:  [phone]  Physical Therapy Treatment  Patient Details  Name: Peter Ryan MRN: [id number] Date of Birth: [birth year] Referring Provider (PT): Vladimir Crofts, MD   Encounter Date: 05/30/2020   PT End of Session - 05/30/20 1705    Visit Number 3    Number of Visits 16    Date for PT Re-Evaluation 07/07/20    Authorization Type Eval: 05/12/20    PT Start Time 1518    PT Stop Time 1600    PT Time Calculation (min) 42 min    Equipment Utilized During Treatment Gait belt    Activity Tolerance Patient tolerated treatment well;No increased pain    Behavior During Therapy WFL for tasks assessed/performed           Past Medical History:  Diagnosis Date  . Arthritis   . BPH (benign prostatic hyperplasia)   . Cancer ([place])    Non-Hodgkins lymphoma  . Chronic prostatitis   . Cirrhosis of liver ([place])   . Dyslipidemia   . Elevated PSA   . Gastric reflux   . Gout   . HTN (hypertension)   . Over weight   . Psoriasis   . Testicular pain, right   . Urinary frequency     Past Surgical History:  Procedure Laterality Date  . hydrocelectomy    . IR FLUORO GUIDE PORT INSERTION RIGHT  04/11/2017  . IR REMOVAL TUN ACCESS W/ PORT W/O FL MOD SED  05/29/2018  . PILONIDAL CYST EXCISION      There were no vitals filed for this visit.   Subjective Assessment - 05/30/20 1704    Subjective Patient presents with new onset of right low back pain that began last week and continues to be present.  Has been compliant with HEP    Patient is accompained by: Family member    Pertinent History Patient presents for balance and walking. He was coming into PT before his right cataract surgery on 03/08/20 and now is returning. He has a history of of arthritis, DM type II, dyslipidemia, gout, cancer; non hodgkins lymphoma, chronic prostatitis, cirrhosis of the  liver, HTN, psoriasis, and reflux. He has chronic knee and ankle pain, but reports no numbness/tingling. His goals are to ride his motorcycle and walk with better balance. He has not had any falls since his surgery. He does not walk with any assistive device but has a cane and walker available.    Limitations Standing;Walking;House hold activities    How long can you sit comfortably? n/a    How long can you stand comfortably? 3 minutes    How long can you walk comfortably? to the mailbox and back    Patient Stated Goals Walk with better balance    Currently in Pain? Yes    Pain Score 5     Pain Location Back    Pain Orientation Lower;Right    Pain Descriptors / Indicators Aching    Pain Type Chronic pain    Pain Onset In the past 7 days    Pain Frequency Constant             Patient presents with new onset of right low back pain that began last week and continues to be present.   Standing in // bars: CGA and cues for body mechanics and safety.  Hamstring walking dynamic mobility stretch 2x length of //  bars; focus on hip hinge airex pad: static stand 60 seconds, cues for core contraction airex pad: horizontal head turns 20x  airex pad: vertical head turns, cues for core activation with vertical head raise due to excessive posterior lean resulting in LOB x20 airex pad: forward/backward step over orange hurdle onto additional airex pad: 10x each LE, BUE support; cues for width of BOS airex pad: lateral step over orange hurdle onto additional airex pad and back 15x each LE, BUE support 6" step eccentric heel taps 10x each LE, BUE support 2x4 between feet, forward ambulation with focus on base of support for increased stabilization, 6x length of // bars  Seated on plinth table:  Sit to stand: nose over toes cueing for sequencing and reduction of use of UE's 10x  Swiss ball forward trunk rollout 15x 10 second holds with cues for chin tucks for reduction of back pain GTB rows 15x (added  to HEP) GTB between feet, internal external rotation for ankle stabilization 10x each LE (added to HEP)  Education on reducing episodes of prolonged sitting, warming up prior to standing by performing 10 seated marches, 10 LAQs, and 10 heel raises for back pain   Pt educated throughout session about proper posture and technique with exercises. Improved exercise technique, movement at target joints, use of target muscles after min to mod verbal, visual, tactile cues; Tolerated session well reporting less back pain at end of session compared to start of session                          PT Education - 05/30/20 1705    Education Details back pain relief, base of support    Person(s) Educated Patient    Methods Explanation;Demonstration;Tactile cues;Verbal cues    Comprehension Verbalized understanding;Returned demonstration;Verbal cues required;Tactile cues required            PT Short Term Goals - 05/12/20 1205      PT SHORT TERM GOAL #1   Title Patient will be independent in home exercise program to improve strength/mobility for better functional independence with ADLs.    Baseline 05/12/20: HEP given    Time 4    Period Weeks    Status New    Target Date 06/09/20      PT SHORT TERM GOAL #2   Title Patient will be able to complete sit to stands without use of BUE support.    Baseline 05/12/20: Patient dependent on BUE support    Time 4    Period Weeks    Status New    Target Date 06/09/20             PT Long Term Goals - 05/12/20 1207      PT LONG TERM GOAL #1   Title Patient will increase FOTO score to equal to or greater than 61/100  to demonstrate statistically significant improvement in mobility and quality of life.    Baseline 05/12/20: 49    Time 8    Period Weeks    Status New    Target Date 07/07/20      PT LONG TERM GOAL #2   Title Patient will increase Berg Balance score by at least 3 points to demonstrate clinically significant  improvement in balance and decreased risk for falls.    Baseline 05/12/20: 41    Time 8    Period Weeks    Status New    Target Date 07/07/20  PT LONG TERM GOAL #3   Title Patient will increase 10 meter walk test to >1.56m/s as to improve gait speed for better community ambulation and to reduce fall risk.    Baseline 05/12/20: Self-selected: 15.46 s = 0.647 m/s, Fastest: 13.88 s = 0.72 m/s;    Time 8    Period Weeks    Status New    Target Date 07/07/20      PT LONG TERM GOAL #4   Title Pt will improve ABC by at least 13% in order to demonstrate clinically significant improvement in balance confidence.    Baseline 05/12/20: 57.5%    Time 8    Period Weeks    Status New    Target Date 07/07/20      PT LONG TERM GOAL #5   Title Pt 5TSTS time will be less than 15 secs with no UE support in order to decrease risk for falls.    Baseline 05/12/20: 11.31 secs with BUE support    Time 8    Period Weeks    Status New    Target Date 07/07/20                 Plan - 05/30/20 1708    Clinical Impression Statement Max cueing required throughout physical therapy session for increasing BOS and widening distance between heels. Patient initially is very stiff and painful however by end of session reports reduction of pain and improved body mechanics. Patient is challenged with ankle righting reactions as well as neutral bony alignment resulting in poor stability in standing without UE support at this time. Pt will benefit from skilled PT services to address deficits in balance and decrease risk for future falls.    Personal Factors and Comorbidities Comorbidity 3+;Time since onset of injury/illness/exacerbation    Comorbidities arthritis, DM type II, dyslipidemia, gout, cancer; non hodgkins lymphoma, chronic prostatitis, cirrhosis of the liver, HTN, psoriasis, and reflux.    Examination-Activity Limitations Bed Mobility;Locomotion Level;Stairs;Squat;Stand;Transfers     Examination-Participation Restrictions Community Activity;Yard Work;Interpersonal Relationship;Cleaning    Stability/Clinical Decision Making Evolving/Moderate complexity    Rehab Potential Fair    PT Frequency 2x / week    PT Duration 8 weeks    PT Treatment/Interventions ADLs/Self Care Home Management;Aquatic Therapy;Biofeedback;Canalith Repostioning;Cryotherapy;Electrical Stimulation;Traction;Ultrasound;DME Instruction;Gait training;Stair training;Functional mobility training;Therapeutic activities;Patient/family education;Neuromuscular re-education;Balance training;Therapeutic exercise;Orthotic Fit/Training;Manual techniques;Splinting;Energy conservation;Dry needling;Passive range of motion;Compression bandaging;Taping;Vestibular;Joint Manipulations;Spinal Manipulations;Moist Heat    PT Next Visit Plan review HEP, posture, LE strength, balance    PT Home Exercise Plan Medbridge: [email or web address]    Consulted and Agree with Plan of Care Patient;Family member/caregiver           Patient will benefit from skilled therapeutic intervention in order to improve the following deficits and impairments:  Abnormal gait, Decreased balance, Decreased coordination, Decreased strength, Difficulty walking, Impaired perceived functional ability, Impaired flexibility, Postural dysfunction, Improper body mechanics  Visit Diagnosis: Unsteadiness on feet  Other abnormalities of gait and mobility  Muscle weakness (generalized)     Problem List Patient Active Problem List   Diagnosis Date Noted  . Secondary malignant neoplasm of retroperitoneum and peritoneum ([place]) 05/02/2020  . Atherosclerosis of aorta ([place]) 05/02/2020  . Goals of care, counseling/discussion 04/08/2017  . 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 ([place]) 04/08/2017  . Ascites 02/08/2017  . Liver cirrhosis ([place]) 02/08/2017  . SBP (spontaneous bacterial peritonitis) ([place]) 02/07/2017  . Exudative pleural effusion - lymphocyte  predominant 11/15/2016  . DOE (dyspnea on exertion) 11/15/2016  .  Obesity, morbid (more than 100 lbs over ideal weight or BMI > 40) ([place]) 11/15/2016  . Elevated PSA 05/30/2015  . BPH with obstruction/lower urinary tract symptoms 05/30/2015  . Type 2 diabetes mellitus ([place]) 05/18/2014  . Arthritis 01/03/2014  . Dyslipidemia 01/03/2014  . Benign essential hypertension 01/03/2014  . Gout 01/03/2014  . Psoriasis 01/03/2014  . Reflux 01/03/2014   Janna Arch, PT, DPT   05/30/2020, 5:09 PM  [place] MAIN [facility] SERVICES [address] [place], [place], [address] Phone: [phone]   Fax:  [phone]  Name: Peter Ryan MRN: [id number] Date of Birth: 12/01/1950

## 2020-05-31 ENCOUNTER — Other Ambulatory Visit: Payer: Self-pay | Admitting: Urology

## 2020-05-31 ENCOUNTER — Emergency Department
Admission: EM | Admit: 2020-05-31 | Discharge: 2020-05-31 | Disposition: A | Discharge status: Home / Self Care | Payer: Medicare Other | Source: Home / Self Care

## 2020-05-31 ENCOUNTER — Telehealth: Payer: Self-pay | Admitting: Gastroenterology

## 2020-05-31 LAB — COMPREHENSIVE METABOLIC PANEL
ALT: 17 U/L (ref 0–44)
AST: 18 U/L (ref 15–41)
Albumin: 3.9 g/dL (ref 3.5–5.0)
Alkaline Phosphatase: 54 U/L (ref 38–126)
Anion gap: 13 (ref 5–15)
BUN: 21 mg/dL (ref 8–23)
CO2: 20 mmol/L — ABNORMAL LOW (ref 22–32)
Calcium: 9.1 mg/dL (ref 8.9–10.3)
Chloride: 104 mmol/L (ref 98–111)
Creatinine, Ser: 0.87 mg/dL (ref 0.61–1.24)
GFR calc Af Amer: >60 mL/min (ref >60)
GFR calc non Af Amer: >60 mL/min (ref >60)
Glucose, Bld: 307 mg/dL — ABNORMAL HIGH (ref 70–99)
Potassium: 3.3 mmol/L — ABNORMAL LOW (ref 3.5–5.1)
Sodium: 137 mmol/L (ref 135–145)
Total Bilirubin: 0.9 mg/dL (ref 0.3–1.2)
Total Protein: 7.2 g/dL (ref 6.5–8.1)

## 2020-05-31 LAB — CBC
HCT: 40.5 % (ref 39.0–52.0)
Hemoglobin: 14.2 g/dL (ref 13.0–17.0)
MCH: 30 pg (ref 26.0–34.0)
MCHC: 35.1 g/dL (ref 30.0–36.0)
MCV: 85.6 fL (ref 80.0–100.0)
Platelets: 233 10*3/uL (ref 150–400)
RBC: 4.73 MIL/uL (ref 4.22–5.81)
RDW: 13.4 % (ref 11.5–15.5)
WBC: 10.3 10*3/uL (ref 4.0–10.5)
nRBC: 0 % (ref 0.0–0.2)

## 2020-05-31 LAB — SAMPLE TO BLOOD BANK

## 2020-05-31 NOTE — Telephone Encounter (Signed)
Patients wife calling stating pt is having rectal bleeding and diarrhea. Pt last seen by Dr. Vicente Males [phone], but wife would like Dr. Vicente Males to be aware and see if he had any suggestions before pt appt on 11.10.21. Please call pt wife back.

## 2020-05-31 NOTE — Telephone Encounter (Signed)
I should probably see him sooner like tomorrow

## 2020-05-31 NOTE — Telephone Encounter (Signed)
Spoke with pt's wife. Pt appt has been rescheduled.

## 2020-06-01 ENCOUNTER — Other Ambulatory Visit: Payer: Self-pay

## 2020-06-01 ENCOUNTER — Encounter: Payer: Self-pay | Admitting: Gastroenterology

## 2020-06-01 ENCOUNTER — Ambulatory Visit (INDEPENDENT_AMBULATORY_CARE_PROVIDER_SITE_OTHER): Payer: Medicare Other | Admitting: Gastroenterology

## 2020-06-01 VITALS — BP 165/76 | HR 101 | Temp 98.3°F | Ht 70.0 in | Wt 284.6 lb

## 2020-06-01 DIAGNOSIS — K625 Hemorrhage of anus and rectum: Secondary | ICD-10-CM

## 2020-06-01 DIAGNOSIS — R197 Diarrhea, unspecified: Secondary | ICD-10-CM

## 2020-06-01 MED ORDER — NA SULFATE-K SULFATE-MG SULF 17.5-3.13-1.6 GM/177ML PO SOLN: 1.0000 | Freq: Once | ORAL | 0 refills | Status: AC

## 2020-06-01 NOTE — Progress Notes (Signed)
Peter Bellows MD, MRCP(U.K) [address]  [place]  [place], [place] [address]  Main: [phone]  Fax: [phone]   Gastroenterology Consultation  Referring Provider:     [name].,* Primary Care Physician:  [name]., MD Primary Gastroenterologist:  Dr. Jonathon Ryan  Reason for Consultation:     Rectal bleeding         HPI:   Peter Ryan is a 69 y.o. y/o male who in 2018 was evaluated by myself for ascites which eventually found out to be stage IV DLBCL.  There was no evidence of cirrhosis from his ascites evaluation.  The ascites was exudative in nature.  He underwent chemotherapy with R-CHOP regimen recently seen in April 2021 by Dr. Janese Banks in oncology and appears to be in remission.  I received a phone call yesterday from his wife stating that he was having blood in the stool.  On 05/30/2020 hemoglobin of 14.2 g.  He states that for the past few days he has had loose stools and has noticed some blood in it.  Denies any NSAID use.  Today had 2 bowel movements with some blood in it.  Last colonoscopy was many years back.  Because he had polyps and is due for a surveillance colonoscopy.    Past Medical History:  Diagnosis Date  . Arthritis   . BPH (benign prostatic hyperplasia)   . Cancer ([place])    Non-Hodgkins lymphoma  . Chronic prostatitis   . Cirrhosis of liver ([place])   . Dyslipidemia   . Elevated PSA   . Gastric reflux   . Gout   . HTN (hypertension)   . Over weight   . Psoriasis   . Testicular pain, right   . Urinary frequency     Past Surgical History:  Procedure Laterality Date  . hydrocelectomy    . IR FLUORO GUIDE PORT INSERTION RIGHT  04/11/2017  . IR REMOVAL TUN ACCESS W/ PORT W/O FL MOD SED  05/29/2018  . PILONIDAL CYST EXCISION      Prior to Admission medications   Medication Sig Start Date End Date Taking? Authorizing Provider  allopurinol (ZYLOPRIM) 300 MG tablet TAKE ONE TABLET EVERY DAY 03/20/20   [name]., MD  amLODipine (NORVASC) 10 MG tablet TAKE ONE TABLET BY MOUTH EVERY DAY 01/04/20   [name]., MD  aspirin EC 81 MG tablet Take 81 mg by mouth daily.     [provider]  Cholecalciferol (VITAMIN D3) 1000 UNITS CAPS Take 1,000 Units by mouth daily.     [provider]  finasteride (PROSCAR) 5 MG tablet TAKE ONE TABLET BY MOUTH EVERY DAY 05/31/20   McGowan, Larene [place] A, PA-C  hydrocortisone 2.5 % lotion APPLY A SMALL AMOUNT TO AFFECTED AREA 3 TIMES A WEEK 09/21/19   [provider]  ketoconazole (NIZORAL) 2 % shampoo SHAMPOO WITH A SMALL AMOUNT AS DIRECTED EVERY OTHER DAY TO DAILY, [place] FACE AND SCALP, LET SIT SEVERAL MINUTES AND RINSE OFF 09/21/19   [provider]  losartan (COZAAR) 100 MG tablet TAKE ONE TABLET EVERY DAY BY MOUTH 03/28/20   [name]., MD  rosuvastatin (CRESTOR) 10 MG tablet Take 1 tablet (10 mg total) by mouth daily. Patient not taking: Reported on 01/04/2020 11/29/19   [name]., MD    Family History  Problem Relation Age of Onset  . Uterine cancer Mother   . Diabetes Mother   .  Cancer Mother   . Cancer Maternal Uncle   . Bladder Cancer Neg Hx   . Kidney disease Neg Hx   . Prostate cancer Neg Hx      Social History   Tobacco Use  . Smoking status: Former Smoker    Packs/day: 1.50    Years: 8.00    Pack years: 12.00    Types: Cigarettes    Quit date: 05/29/1974    Years since quitting: 46.0  . Smokeless tobacco: Never Used  . Tobacco comment: quit 40 years ago  Vaping Use  . Vaping Use: Never used  Substance Use Topics  . Alcohol use: No    Alcohol/week: 0.0 standard drinks    Comment: rarely  . Drug use: No    Allergies as of 06/01/2020 - Review Complete 05/30/2020  Allergen Reaction Noted  . Metformin Other (See Comments) 05/23/2015    Review of Systems:    All systems reviewed and negative except where noted in HPI.   Physical Exam:  There were no vitals taken for this  visit. No LMP for male patient. Psych:  Alert and cooperative. Normal mood and affect. General:   Alert,  Well-developed, well-nourished, pleasant and cooperative in NAD Head:  Normocephalic and atraumatic. Eyes:  Sclera clear, no icterus.   Conjunctiva pink. Ears:  Normal auditory acuity.  Lungs:  Respirations even and unlabored.  Clear throughout to auscultation.   No wheezes, crackles, or rhonchi. No acute distress. Heart:  Regular rate and rhythm; no murmurs, clicks, rubs, or gallops. Abdomen:  Normal bowel sounds.  No bruits.  Soft, non-tender and non-distended without masses, hepatosplenomegaly or hernias noted.  No guarding or rebound tenderness.    Neurologic:  Alert and oriented x3;  grossly normal neurologically. Psych:  Alert and cooperative. Normal mood and affect.  Imaging Studies: No results found.  Assessment and Plan:   Peter Ryan is a 69 y.o. y/o male has been referred for rectal bleeding.  05/30/2020 hemoglobin was normal.  Likely lower GI bleed.  Plan 1.  Diagnostic colonoscopy 2.  Stool studies to rule out infection  I have discussed alternative options, risks & benefits,  which include, but are not limited to, bleeding, infection, perforation,respiratory complication & drug reaction.  The patient agrees with this plan & written consent will be obtained.     Follow up in 4 weeks  Dr Peter Bellows MD,MRCP(U.K)

## 2020-06-02 ENCOUNTER — Ambulatory Visit: Payer: Medicare Other

## 2020-06-05 ENCOUNTER — Inpatient Hospital Stay: Payer: Medicare Other

## 2020-06-05 ENCOUNTER — Encounter: Payer: Self-pay | Admitting: Emergency Medicine

## 2020-06-05 ENCOUNTER — Inpatient Hospital Stay (HOSPITAL_COMMUNITY)
Admit: 2020-06-05 | Discharge: 2020-06-05 | Disposition: A | Discharge status: Home / Self Care | Payer: Medicare Other | Attending: Internal Medicine | Admitting: Internal Medicine

## 2020-06-05 ENCOUNTER — Other Ambulatory Visit: Payer: Self-pay

## 2020-06-05 ENCOUNTER — Emergency Department: Payer: Medicare Other

## 2020-06-05 ENCOUNTER — Other Ambulatory Visit: Payer: Self-pay | Admitting: Family Medicine

## 2020-06-05 ENCOUNTER — Inpatient Hospital Stay
Admission: EM | Admit: 2020-06-05 | Discharge: 2020-06-14 | DRG: 065 | Disposition: A | Discharge status: Rehabilitation Facility | Payer: Medicare Other | Attending: Internal Medicine | Admitting: Internal Medicine

## 2020-06-05 DIAGNOSIS — W19XXXA Unspecified fall, initial encounter: Secondary | ICD-10-CM | POA: Diagnosis not present

## 2020-06-05 DIAGNOSIS — L409 Psoriasis, unspecified: Secondary | ICD-10-CM | POA: Diagnosis present

## 2020-06-05 DIAGNOSIS — K7460 Unspecified cirrhosis of liver: Secondary | ICD-10-CM | POA: Diagnosis present

## 2020-06-05 DIAGNOSIS — I63521 Cerebral infarction due to unspecified occlusion or stenosis of right anterior cerebral artery: Secondary | ICD-10-CM | POA: Diagnosis present

## 2020-06-05 DIAGNOSIS — M109 Gout, unspecified: Secondary | ICD-10-CM | POA: Diagnosis present

## 2020-06-05 DIAGNOSIS — I10 Essential (primary) hypertension: Secondary | ICD-10-CM

## 2020-06-05 DIAGNOSIS — Z8049 Family history of malignant neoplasm of other genital organs: Secondary | ICD-10-CM

## 2020-06-05 DIAGNOSIS — R29701 NIHSS score 1: Secondary | ICD-10-CM | POA: Diagnosis present

## 2020-06-05 DIAGNOSIS — E66813 Obesity, class 3: Secondary | ICD-10-CM | POA: Diagnosis present

## 2020-06-05 DIAGNOSIS — R1310 Dysphagia, unspecified: Secondary | ICD-10-CM | POA: Diagnosis present

## 2020-06-05 DIAGNOSIS — G8191 Hemiplegia, unspecified affecting right dominant side: Secondary | ICD-10-CM

## 2020-06-05 DIAGNOSIS — I6350 Cerebral infarction due to unspecified occlusion or stenosis of unspecified cerebral artery: Secondary | ICD-10-CM | POA: Diagnosis not present

## 2020-06-05 DIAGNOSIS — S199XXA Unspecified injury of neck, initial encounter: Secondary | ICD-10-CM | POA: Diagnosis not present

## 2020-06-05 DIAGNOSIS — E1151 Type 2 diabetes mellitus with diabetic peripheral angiopathy without gangrene: Secondary | ICD-10-CM | POA: Diagnosis present

## 2020-06-05 DIAGNOSIS — E785 Hyperlipidemia, unspecified: Secondary | ICD-10-CM | POA: Diagnosis present

## 2020-06-05 DIAGNOSIS — Z9221 Personal history of antineoplastic chemotherapy: Secondary | ICD-10-CM | POA: Diagnosis not present

## 2020-06-05 DIAGNOSIS — R5381 Other malaise: Secondary | ICD-10-CM | POA: Diagnosis present

## 2020-06-05 DIAGNOSIS — E119 Type 2 diabetes mellitus without complications: Secondary | ICD-10-CM

## 2020-06-05 DIAGNOSIS — R739 Hyperglycemia, unspecified: Secondary | ICD-10-CM

## 2020-06-05 DIAGNOSIS — R531 Weakness: Secondary | ICD-10-CM

## 2020-06-05 DIAGNOSIS — K625 Hemorrhage of anus and rectum: Secondary | ICD-10-CM | POA: Diagnosis not present

## 2020-06-05 DIAGNOSIS — C786 Secondary malignant neoplasm of retroperitoneum and peritoneum: Secondary | ICD-10-CM | POA: Diagnosis present

## 2020-06-05 DIAGNOSIS — R4701 Aphasia: Secondary | ICD-10-CM | POA: Diagnosis present

## 2020-06-05 DIAGNOSIS — C8590 Non-Hodgkin lymphoma, unspecified, unspecified site: Secondary | ICD-10-CM | POA: Diagnosis present

## 2020-06-05 DIAGNOSIS — I639 Cerebral infarction, unspecified: Secondary | ICD-10-CM | POA: Diagnosis not present

## 2020-06-05 DIAGNOSIS — I6389 Other cerebral infarction: Secondary | ICD-10-CM

## 2020-06-05 DIAGNOSIS — N400 Enlarged prostate without lower urinary tract symptoms: Secondary | ICD-10-CM | POA: Diagnosis present

## 2020-06-05 DIAGNOSIS — E1140 Type 2 diabetes mellitus with diabetic neuropathy, unspecified: Secondary | ICD-10-CM | POA: Diagnosis present

## 2020-06-05 DIAGNOSIS — R29810 Facial weakness: Secondary | ICD-10-CM

## 2020-06-05 DIAGNOSIS — R270 Ataxia, unspecified: Secondary | ICD-10-CM | POA: Diagnosis present

## 2020-06-05 DIAGNOSIS — Z20822 Contact with and (suspected) exposure to covid-19: Secondary | ICD-10-CM | POA: Diagnosis present

## 2020-06-05 DIAGNOSIS — Z043 Encounter for examination and observation following other accident: Secondary | ICD-10-CM | POA: Diagnosis not present

## 2020-06-05 DIAGNOSIS — Z7982 Long term (current) use of aspirin: Secondary | ICD-10-CM

## 2020-06-05 DIAGNOSIS — I672 Cerebral atherosclerosis: Secondary | ICD-10-CM | POA: Diagnosis present

## 2020-06-05 DIAGNOSIS — Z833 Family history of diabetes mellitus: Secondary | ICD-10-CM | POA: Diagnosis not present

## 2020-06-05 DIAGNOSIS — Z87891 Personal history of nicotine dependence: Secondary | ICD-10-CM

## 2020-06-05 DIAGNOSIS — R471 Dysarthria and anarthria: Secondary | ICD-10-CM | POA: Diagnosis present

## 2020-06-05 DIAGNOSIS — Z6841 Body Mass Index (BMI) 40.0 and over, adult: Secondary | ICD-10-CM | POA: Diagnosis not present

## 2020-06-05 DIAGNOSIS — K219 Gastro-esophageal reflux disease without esophagitis: Secondary | ICD-10-CM | POA: Diagnosis present

## 2020-06-05 DIAGNOSIS — M1990 Unspecified osteoarthritis, unspecified site: Secondary | ICD-10-CM | POA: Diagnosis present

## 2020-06-05 DIAGNOSIS — K922 Gastrointestinal hemorrhage, unspecified: Secondary | ICD-10-CM | POA: Diagnosis not present

## 2020-06-05 DIAGNOSIS — S0990XA Unspecified injury of head, initial encounter: Secondary | ICD-10-CM | POA: Diagnosis not present

## 2020-06-05 DIAGNOSIS — Z888 Allergy status to other drugs, medicaments and biological substances status: Secondary | ICD-10-CM

## 2020-06-05 DIAGNOSIS — Z79899 Other long term (current) drug therapy: Secondary | ICD-10-CM

## 2020-06-05 DIAGNOSIS — E1165 Type 2 diabetes mellitus with hyperglycemia: Secondary | ICD-10-CM

## 2020-06-05 DIAGNOSIS — I6529 Occlusion and stenosis of unspecified carotid artery: Secondary | ICD-10-CM

## 2020-06-05 DIAGNOSIS — I6782 Cerebral ischemia: Secondary | ICD-10-CM | POA: Diagnosis not present

## 2020-06-05 HISTORY — DX: Polyneuropathy, unspecified: G62.9

## 2020-06-05 LAB — CBC
HCT: 34 % — ABNORMAL LOW (ref 39.0–52.0)
Hemoglobin: 11.9 g/dL — ABNORMAL LOW (ref 13.0–17.0)
MCH: 29.8 pg (ref 26.0–34.0)
MCHC: 35 g/dL (ref 30.0–36.0)
MCV: 85.2 fL (ref 80.0–100.0)
Platelets: 254 10*3/uL (ref 150–400)
RBC: 3.99 MIL/uL — ABNORMAL LOW (ref 4.22–5.81)
RDW: 13.8 % (ref 11.5–15.5)
WBC: 11.4 10*3/uL — ABNORMAL HIGH (ref 4.0–10.5)
nRBC: 0 % (ref 0.0–0.2)

## 2020-06-05 LAB — COMPREHENSIVE METABOLIC PANEL
ALT: 21 U/L (ref 0–44)
AST: 27 U/L (ref 15–41)
Albumin: 3.9 g/dL (ref 3.5–5.0)
Alkaline Phosphatase: 56 U/L (ref 38–126)
Anion gap: 15 (ref 5–15)
BUN: 19 mg/dL (ref 8–23)
CO2: 22 mmol/L (ref 22–32)
Calcium: 8.6 mg/dL — ABNORMAL LOW (ref 8.9–10.3)
Chloride: 103 mmol/L (ref 98–111)
Creatinine, Ser: 0.81 mg/dL (ref 0.61–1.24)
GFR calc Af Amer: >60 mL/min (ref >60)
GFR calc non Af Amer: >60 mL/min (ref >60)
Glucose, Bld: 252 mg/dL — ABNORMAL HIGH (ref 70–99)
Potassium: 3.4 mmol/L — ABNORMAL LOW (ref 3.5–5.1)
Sodium: 140 mmol/L (ref 135–145)
Total Bilirubin: 0.7 mg/dL (ref 0.3–1.2)
Total Protein: 7.2 g/dL (ref 6.5–8.1)

## 2020-06-05 LAB — DIFFERENTIAL
Abs Immature Granulocytes: 0.06 10*3/uL (ref 0.00–0.07)
Basophils Absolute: 0.1 10*3/uL (ref 0.0–0.1)
Basophils Relative: 1 %
Eosinophils Absolute: 0.1 10*3/uL (ref 0.0–0.5)
Eosinophils Relative: 1 %
Immature Granulocytes: 1 %
Lymphocytes Relative: 13 %
Lymphs Abs: 1.4 10*3/uL (ref 0.7–4.0)
Monocytes Absolute: 0.8 10*3/uL (ref 0.1–1.0)
Monocytes Relative: 7 %
Neutro Abs: 8.9 10*3/uL — ABNORMAL HIGH (ref 1.7–7.7)
Neutrophils Relative %: 77 %

## 2020-06-05 LAB — APTT: aPTT: 29 seconds (ref 24–36)

## 2020-06-05 LAB — PROTIME-INR
INR: 1 (ref 0.8–1.2)
Prothrombin Time: 12.9 seconds (ref 11.4–15.2)

## 2020-06-05 LAB — SARS CORONAVIRUS 2 BY RT PCR (HOSPITAL ORDER, PERFORMED IN ~~LOC~~ HOSPITAL LAB): SARS Coronavirus 2: NEGATIVE

## 2020-06-05 LAB — GLUCOSE, CAPILLARY
Glucose-Capillary: 166 mg/dL — ABNORMAL HIGH (ref 70–99)
Glucose-Capillary: 166 mg/dL — ABNORMAL HIGH (ref 70–99)

## 2020-06-05 MED ORDER — VITAMIN D 25 MCG (1000 UNIT) PO TABS
1000.0000 [IU] | ORAL_TABLET | Freq: Every day | ORAL | Status: DC
  Administered 2020-06-06 – 2020-06-14 (×9): 1000 [IU] via ORAL
  Filled 2020-06-05 (×10): qty 1

## 2020-06-05 MED ORDER — HYDROCORTISONE 1 % EX LOTN
TOPICAL_LOTION | CUTANEOUS | Status: DC
  Filled 2020-06-05: qty 118

## 2020-06-05 MED ORDER — HYDROCORTISONE 1 % EX CREA
TOPICAL_CREAM | CUTANEOUS | Status: DC
  Filled 2020-06-05: qty 28

## 2020-06-05 MED ORDER — ASPIRIN EC 325 MG PO TBEC
325.0000 mg | DELAYED_RELEASE_TABLET | Freq: Every day | ORAL | Status: DC
  Filled 2020-06-05: qty 1

## 2020-06-05 MED ORDER — STROKE: EARLY STAGES OF RECOVERY BOOK: Freq: Once | Status: DC

## 2020-06-05 MED ORDER — LORAZEPAM 2 MG/ML IJ SOLN
2.0000 mg | Freq: Once | INTRAMUSCULAR | Status: AC
  Administered 2020-06-05: 2 mg via INTRAVENOUS
  Filled 2020-06-05: qty 1

## 2020-06-05 MED ORDER — INSULIN ASPART 100 UNIT/ML ~~LOC~~ SOLN
0.0000 [IU] | SUBCUTANEOUS | Status: DC
  Administered 2020-06-06: 3 [IU] via SUBCUTANEOUS
  Administered 2020-06-06 – 2020-06-07 (×4): 4 [IU] via SUBCUTANEOUS
  Administered 2020-06-07: 3 [IU] via SUBCUTANEOUS
  Administered 2020-06-07: 4 [IU] via SUBCUTANEOUS
  Administered 2020-06-07 (×2): 3 [IU] via SUBCUTANEOUS
  Administered 2020-06-08: 4 [IU] via SUBCUTANEOUS
  Administered 2020-06-08: 3 [IU] via SUBCUTANEOUS
  Filled 2020-06-05 (×13): qty 1

## 2020-06-05 MED ORDER — ASPIRIN EC 325 MG PO TBEC
325.0000 mg | DELAYED_RELEASE_TABLET | Freq: Every day | ORAL | Status: DC
  Administered 2020-06-05 – 2020-06-14 (×10): 325 mg via ORAL
  Filled 2020-06-05 (×9): qty 1

## 2020-06-05 MED ORDER — SODIUM CHLORIDE 0.9% FLUSH
3.0000 mL | Freq: Once | INTRAVENOUS | Status: DC
Start: 2020-06-05 — End: 2020-06-14

## 2020-06-05 MED ORDER — ACETAMINOPHEN 160 MG/5ML PO SOLN
650.0000 mg | ORAL | Status: DC | PRN
  Filled 2020-06-05: qty 20.3

## 2020-06-05 MED ORDER — ACETAMINOPHEN 325 MG PO TABS: 650.0000 mg | ORAL_TABLET | ORAL | Status: DC | PRN

## 2020-06-05 MED ORDER — IOHEXOL 350 MG/ML SOLN
75.0000 mL | Freq: Once | INTRAVENOUS | Status: AC | PRN
  Administered 2020-06-05: 75 mL via INTRAVENOUS

## 2020-06-05 MED ORDER — ACETAMINOPHEN 650 MG RE SUPP: 650.0000 mg | RECTAL | Status: DC | PRN

## 2020-06-05 MED ORDER — ASPIRIN 300 MG RE SUPP
300.0000 mg | Freq: Every day | RECTAL | Status: DC
  Filled 2020-06-05 (×9): qty 1

## 2020-06-05 MED ORDER — ATORVASTATIN CALCIUM 20 MG PO TABS
80.0000 mg | ORAL_TABLET | Freq: Every day | ORAL | Status: DC
  Filled 2020-06-05 (×7): qty 4

## 2020-06-05 MED ORDER — ALLOPURINOL 300 MG PO TABS
300.0000 mg | ORAL_TABLET | Freq: Every day | ORAL | Status: DC
  Administered 2020-06-05 – 2020-06-14 (×9): 300 mg via ORAL
  Filled 2020-06-05 (×11): qty 1

## 2020-06-05 MED ORDER — FINASTERIDE 5 MG PO TABS
5.0000 mg | ORAL_TABLET | Freq: Every day | ORAL | Status: DC
  Administered 2020-06-05 – 2020-06-14 (×10): 5 mg via ORAL
  Filled 2020-06-05 (×10): qty 1

## 2020-06-05 NOTE — ED Triage Notes (Signed)
Arrives via [facility].  STates woke up last night at 0400 and fell due  To right sided weakness.  STates noticed right side weakness 2 days ago.  Per EMS, patient walks with a walker, but has not been able to walk since fall this morning at 0400.   Per EMS report CBG:  218.  VS wnl.  Right sided facial droop noted, speech slurred.  Right arm drift noted, equal hand grips.  Sensation equal.

## 2020-06-05 NOTE — ED Notes (Signed)
Pt transported to MRI 

## 2020-06-05 NOTE — ED Notes (Signed)
This tech, an inpatient tech and MRI tech assisted pt to use the bsc. 3x assistance.  Pt was unable to use bsc and urinal. Pt was unable to help much with getting up from stretcher to bsc. Chrissy, RN aware.

## 2020-06-05 NOTE — H&P (Addendum)
History and Physical    Peter Ryan MEQ:[id number] DOB: 1951/03/15 DOA: 06/05/2020  PCP: [name]., MD   Patient coming from: Home  I have personally briefly reviewed patient's old medical records in [place] [place]  Chief Complaint: Weakness and aphasia  HPI: Peter Ryan is a 69 y.o. male with medical history significant for obesity, BPH, hypertension, diabetes mellitus, dyslipidemia and non-Hodgkin's lymphoma status post chemotherapy currently under surveillance who presents to the emergency room for evaluation of right-sided weakness which started 1 day prior to his presentation but was more pronounced on the day of admission when he got up to use the bathroom and fell on his bottom. He was unable to get up and his wife was unable to assist him up so she called EMS. EMS was able to assist patient back to bed.  He has not been able to ambulate since his fall this morning. His wife also states that his speech was slurred. He denies hitting his head when he fell.  He denies having any difficulty swallowing, no blurred vision or headache. He denies having any fever, no chills, no cough, no chest pain, no nausea, no vomiting no changes in his bowel habits or urinary symptoms. Labs show sodium 140, potassium 3.4, chloride 93, bicarb 22, BUN 19, creatinine 0.81, AST 27, ALT 21, white count 11.4, hemoglobin 11.9, hematocrit 34, RDW 13.8, platelet count 254, PT 12.9, INR 1.0 CT scan of the head without contrast shows no acute intracranial findings.  Chronic small vessel ischemic changes in the periventricular white matter and basal ganglia. CT angiogram shows no large vessel occlusion.  Intracranial atherosclerosis including severe stenosis of the left M2 branch vessel and not the median entry of the corpus callosum.  Strongly dominant right vertebral artery with possible moderate proximal B1 stenosis.  Hypoplastic left vertebral artery with a severe distal V4 stenosis. Twelve-lead  EKG shows normal sinus rhythm with prolonged QT.   ED Course: Patient is a 69 year old male with a history of diabetes mellitus, hypertension, morbid obesity, history of non-Hodgkin's lymphoma status post chemotherapy with complications of neuropathy.  Patient was brought to the ER by EMS for evaluation of right-sided weakness and slurred speech concerning for stroke.  Initial CT scan of the head without contrast did not show any acute bleed and CT angiogram did not show any large vessel occlusion.  Patient will be admitted to the hospital for stroke work-up.  Review of Systems: As per HPI otherwise 10 point review of systems negative.    Past Medical History:  Diagnosis Date  . Arthritis   . BPH (benign prostatic hyperplasia)   . Cancer ([place])    Non-Hodgkins lymphoma  . Chronic prostatitis   . Cirrhosis of liver ([place])   . Diabetes mellitus without complication ([place])   . Dyslipidemia   . Elevated PSA   . Gastric reflux   . Gout   . HTN (hypertension)   . Neuropathy   . Over weight   . Psoriasis   . Testicular pain, right   . Urinary frequency     Past Surgical History:  Procedure Laterality Date  . hydrocelectomy    . IR FLUORO GUIDE PORT INSERTION RIGHT  04/11/2017  . IR REMOVAL TUN ACCESS W/ PORT W/O FL MOD SED  05/29/2018  . PILONIDAL CYST EXCISION       reports that he quit smoking about 46 years ago. His smoking use included cigarettes. He has a 12.00 pack-year smoking history. He has  never used smokeless tobacco. He reports that he does not drink alcohol and does not use drugs.  Allergies  Allergen Reactions  . Metformin Other (See Comments)    Stomach upset     Family History  Problem Relation Age of Onset  . Uterine cancer Mother   . Diabetes Mother   . Cancer Mother   . Cancer Maternal Uncle   . Bladder Cancer Neg Hx   . Kidney disease Neg Hx   . Prostate cancer Neg Hx      Prior to Admission medications   Medication Sig Start Date End Date Taking?  Authorizing Provider  allopurinol (ZYLOPRIM) 300 MG tablet TAKE ONE TABLET EVERY DAY 03/20/20   [name]., MD  amLODipine (NORVASC) 10 MG tablet TAKE ONE TABLET BY MOUTH EVERY DAY 06/05/20   [name]., MD  aspirin EC 81 MG tablet Take 81 mg by mouth daily.     [provider]  Cholecalciferol (VITAMIN D3) 1000 UNITS CAPS Take 1,000 Units by mouth daily.     [provider]  finasteride (PROSCAR) 5 MG tablet TAKE ONE TABLET BY MOUTH EVERY DAY 05/31/20   McGowan, Larene Beach A, PA-C  hydrocortisone 2.5 % lotion APPLY A SMALL AMOUNT TO AFFECTED AREA 3 TIMES A WEEK 09/21/19   [provider]  ketoconazole (NIZORAL) 2 % shampoo SHAMPOO WITH A SMALL AMOUNT AS DIRECTED EVERY OTHER DAY TO DAILY, [place] FACE AND SCALP, LET SIT SEVERAL MINUTES AND RINSE OFF 09/21/19   [provider]  losartan (COZAAR) 100 MG tablet TAKE ONE TABLET EVERY DAY BY MOUTH 03/28/20   [name]., MD  rosuvastatin (CRESTOR) 10 MG tablet Take 1 tablet (10 mg total) by mouth daily. Patient not taking: Reported on 01/04/2020 11/29/19   [name]., MD    Physical Exam: Vitals:   06/05/20 1051 06/05/20 1054 06/05/20 1400 06/05/20 1430  BP:  (!) 133/59 139/67 136/63  Pulse:  92 77 76  Resp:  16 20 18   Temp:  99.1 F (37.3 C)    TempSrc:  Oral    SpO2:  95% 100% 99%  Weight: 129.1 kg     Height: 5\' 10"  (1.778 m)        Vitals:   06/05/20 1051 06/05/20 1054 06/05/20 1400 06/05/20 1430  BP:  (!) 133/59 139/67 136/63  Pulse:  92 77 76  Resp:  16 20 18   Temp:  99.1 F (37.3 C)    TempSrc:  Oral    SpO2:  95% 100% 99%  Weight: 129.1 kg     Height: 5\' 10"  (1.778 m)       Constitutional: NAD, alert and oriented x 3 Eyes: PERRL, lids and conjunctivae normal ENMT: Mucous membranes are moist.  Neck: normal, supple, no masses, no thyromegaly Respiratory: clear to auscultation bilaterally, no wheezing, no crackles. Normal respiratory effort. No accessory  muscle use.  Cardiovascular: Regular rate and rhythm, no murmurs / rubs / gallops. No extremity edema. 2+ pedal pulses. No carotid bruits.  Abdomen: no tenderness, no masses palpated. No hepatosplenomegaly. Bowel sounds positive.  Musculoskeletal: no clubbing / cyanosis. No joint deformity upper and lower extremities.  Skin: no rashes, lesions, ulcers.  Neurologic: Patient has a right upper extremity pronator drift.  No finger dysmetria.  Patient does have a right-sided facial droop. Strength 4/5 in right upper and lowe extremity  Psychiatric: Normal mood and affect.   Labs on Admission: I have personally reviewed  following labs and imaging studies  CBC: Recent Labs  Lab 05/30/20 2358 06/05/20 1100  WBC 10.3 11.4*  NEUTROABS  --  8.9*  HGB 14.2 11.9*  HCT 40.5 34.0*  MCV 85.6 85.2  PLT 233 762   Basic Metabolic Panel: Recent Labs  Lab 05/30/20 2358 06/05/20 1100  NA 137 140  K 3.3* 3.4*  CL 104 103  CO2 20* 22  GLUCOSE 307* 252*  BUN 21 19  CREATININE 0.87 0.81  CALCIUM 9.1 8.6*   GFR: Estimated Creatinine Clearance: 119.4 mL/min (by C-G formula based on SCr of 0.81 mg/dL). Liver Function Tests: Recent Labs  Lab 05/30/20 2358 06/05/20 1100  AST 18 27  ALT 17 21  ALKPHOS 54 56  BILITOT 0.9 0.7  PROT 7.2 7.2  ALBUMIN 3.9 3.9   No results for input(s): LIPASE, AMYLASE in the last 168 hours. No results for input(s): AMMONIA in the last 168 hours. Coagulation Profile: Recent Labs  Lab 06/05/20 1100  INR 1.0   Cardiac Enzymes: No results for input(s): CKTOTAL, CKMB, CKMBINDEX, TROPONINI in the last 168 hours. BNP (last 3 results) No results for input(s): PROBNP in the last 8760 hours. HbA1C: No results for input(s): HGBA1C in the last 72 hours. CBG: No results for input(s): GLUCAP in the last 168 hours. Lipid Profile: No results for input(s): CHOL, HDL, LDLCALC, TRIG, CHOLHDL, LDLDIRECT in the last 72 hours. Thyroid Function Tests: No results for  input(s): TSH, T4TOTAL, FREET4, T3FREE, THYROIDAB in the last 72 hours. Anemia Panel: No results for input(s): VITAMINB12, FOLATE, FERRITIN, TIBC, IRON, RETICCTPCT in the last 72 hours. Urine analysis:    Component Value Date/Time   APPEARANCEUR Clear 07/18/2016 0927   GLUCOSEU Negative 07/18/2016 0927   BILIRUBINUR Negative 07/18/2016 0927   PROTEINUR 1+ (A) 07/18/2016 0927   UROBILINOGEN 0.2 06/14/2016 1419   NITRITE Negative 07/18/2016 0927   LEUKOCYTESUR Negative 07/18/2016 0927    Radiological Exams on Admission: CT Angio Head W or Wo Contrast  Result Date: 06/05/2020 CLINICAL DATA:  Right-sided weakness for 2 days with a fall today. Facial droop and slurred speech. EXAM: CT ANGIOGRAPHY HEAD AND NECK TECHNIQUE: Multidetector CT imaging of the head and neck was performed using the standard protocol during bolus administration of intravenous contrast. Multiplanar CT image reconstructions and MIPs were obtained to evaluate the vascular anatomy. Carotid stenosis measurements (when applicable) are obtained utilizing NASCET criteria, using the distal internal carotid diameter as the denominator. CONTRAST:  42mL OMNIPAQUE IOHEXOL 350 MG/ML SOLN COMPARISON:  None. FINDINGS: CTA NECK FINDINGS Aortic arch: Standard 3 vessel aortic arch with widely patent arch vessel origins. Right carotid system: Patent with mild calcified and soft plaque at the carotid bifurcation. No evidence of significant stenosis or dissection. Left carotid system: Patent with mild calcified and soft plaque at the carotid bifurcation. No evidence of significant stenosis or dissection. Vertebral arteries: The vertebral arteries are patent with the right being strongly dominant. There is the suggestion of a moderate proximal right V1 stenosis although shoulder artifact may also be contributing to this appearance. Skeleton: Cervical spondylosis most notable at C6-7 where asymmetric left uncovertebral spurring results in moderate left  neural foraminal stenosis. Other neck: No evidence of cervical lymphadenopathy or mass. Upper chest: Clear lung apices. Review of the MIP images confirms the above findings CTA HEAD FINDINGS Anterior circulation: The internal carotid arteries are patent from skull base to carotid termini with mild atherosclerotic plaque bilaterally not resulting in significant stenosis. ACAs and MCAs are  patent without evidence of a proximal branch occlusion or significant A1 or M1 stenosis. There is atherosclerotic irregularity of the branch vessels as well as a focal severe proximal left M2 branch stenosis. There is a median artery of the corpus callosum with a severe stenosis distally. No aneurysm is identified. Posterior circulation: The intracranial vertebral arteries are patent to the basilar with mild atherosclerotic narrowing on the right. The left V4 segment is diffusely small with multiple superimposed stenoses including a severe stenosis distally. Patent AICA and SCA origins are visualized bilaterally. The basilar artery is widely patent. There is a fetal origin of the right PCA. Both PCAs demonstrate mild diffuse atherosclerotic irregularity without evidence of a flow limiting proximal stenosis. No aneurysm is identified. Venous sinuses: Patent. Anatomic variants: Strongly dominant right vertebral artery. Median artery of the corpus callosum. Review of the MIP images confirms the above findings IMPRESSION: 1. No large vessel occlusion. 2. Intracranial atherosclerosis including severe stenoses of a left M2 branch vessel and of the median artery of the corpus callosum. 3. Strongly dominant right vertebral artery with possible moderate proximal V1 stenosis. 4. Hypoplastic left vertebral artery with a severe distal V4 stenosis. 5. Widely patent cervical carotid arteries. Electronically Signed   By: Logan Bores M.D.   On: 06/05/2020 15:50   CT HEAD WO CONTRAST  Result Date: 06/05/2020 CLINICAL DATA:  Right-sided weakness  for 2 days.  Fall this morning. EXAM: CT HEAD WITHOUT CONTRAST TECHNIQUE: Contiguous axial images were obtained from the base of the skull through the vertex without intravenous contrast. COMPARISON:  Limited correlation made with PET-CT 05/08/2018. FINDINGS: Brain: There is no evidence of acute intracranial hemorrhage, mass lesion, brain edema or extra-axial fluid collection. The ventricles and subarachnoid spaces are appropriately sized for age. There is no CT evidence of acute cortical infarction. There is patchy and confluent low-density in the periventricular white matter bilaterally, most consistent with chronic small vessel ischemic changes. There is mild involvement of the right basal ganglia and left thalamus. Vascular: Intracranial vascular calcifications. No hyperdense vessel identified. Skull: Negative for fracture or focal lesion. Sinuses/Orbits: The visualized paranasal sinuses and mastoid air cells are clear. No orbital abnormalities are seen. Other: None. IMPRESSION: 1. No acute intracranial findings. 2. Chronic small vessel ischemic changes in the periventricular white matter and basal ganglia. Electronically Signed   By: Richardean Sale M.D.   On: 06/05/2020 11:38   CT Angio Neck W and/or Wo Contrast  Result Date: 06/05/2020 CLINICAL DATA:  Right-sided weakness for 2 days with a fall today. Facial droop and slurred speech. EXAM: CT ANGIOGRAPHY HEAD AND NECK TECHNIQUE: Multidetector CT imaging of the head and neck was performed using the standard protocol during bolus administration of intravenous contrast. Multiplanar CT image reconstructions and MIPs were obtained to evaluate the vascular anatomy. Carotid stenosis measurements (when applicable) are obtained utilizing NASCET criteria, using the distal internal carotid diameter as the denominator. CONTRAST:  81mL OMNIPAQUE IOHEXOL 350 MG/ML SOLN COMPARISON:  None. FINDINGS: CTA NECK FINDINGS Aortic arch: Standard 3 vessel aortic arch with widely  patent arch vessel origins. Right carotid system: Patent with mild calcified and soft plaque at the carotid bifurcation. No evidence of significant stenosis or dissection. Left carotid system: Patent with mild calcified and soft plaque at the carotid bifurcation. No evidence of significant stenosis or dissection. Vertebral arteries: The vertebral arteries are patent with the right being strongly dominant. There is the suggestion of a moderate proximal right V1 stenosis although shoulder artifact may  also be contributing to this appearance. Skeleton: Cervical spondylosis most notable at C6-7 where asymmetric left uncovertebral spurring results in moderate left neural foraminal stenosis. Other neck: No evidence of cervical lymphadenopathy or mass. Upper chest: Clear lung apices. Review of the MIP images confirms the above findings CTA HEAD FINDINGS Anterior circulation: The internal carotid arteries are patent from skull base to carotid termini with mild atherosclerotic plaque bilaterally not resulting in significant stenosis. ACAs and MCAs are patent without evidence of a proximal branch occlusion or significant A1 or M1 stenosis. There is atherosclerotic irregularity of the branch vessels as well as a focal severe proximal left M2 branch stenosis. There is a median artery of the corpus callosum with a severe stenosis distally. No aneurysm is identified. Posterior circulation: The intracranial vertebral arteries are patent to the basilar with mild atherosclerotic narrowing on the right. The left V4 segment is diffusely small with multiple superimposed stenoses including a severe stenosis distally. Patent AICA and SCA origins are visualized bilaterally. The basilar artery is widely patent. There is a fetal origin of the right PCA. Both PCAs demonstrate mild diffuse atherosclerotic irregularity without evidence of a flow limiting proximal stenosis. No aneurysm is identified. Venous sinuses: Patent. Anatomic variants:  Strongly dominant right vertebral artery. Median artery of the corpus callosum. Review of the MIP images confirms the above findings IMPRESSION: 1. No large vessel occlusion. 2. Intracranial atherosclerosis including severe stenoses of a left M2 branch vessel and of the median artery of the corpus callosum. 3. Strongly dominant right vertebral artery with possible moderate proximal V1 stenosis. 4. Hypoplastic left vertebral artery with a severe distal V4 stenosis. 5. Widely patent cervical carotid arteries. Electronically Signed   By: Logan Bores M.D.   On: 06/05/2020 15:50    EKG: Independently reviewed.  Normal sinus rhythm Prolonged QT  Assessment/Plan Principal Problem:   Acute CVA (cerebrovascular accident) ([place]) Active Problems:   Type 2 diabetes mellitus ([facility])   Benign essential hypertension   Obesity, Class III, BMI 40-49.9 (morbid obesity) ([place])    Acute CVA Patient presented to the ER for evaluation of right-sided weakness and slurred speech Symptoms had started 1 day prior to his presentation to the ER and so patient was not a candidate for TPA Initial CT scan of the head without contrast was negative for a bleed and CT angiogram was negative for large vessel occlusion Obtain MRI of the brain to rule out an acute stroke Place patient on aspirin and high intensity statins Obtain 2D echocardiogram to assess LVEF and rule out cardiac thrombus We will request PT/speech therapy/occupational therapy consult Allow for permissive hypertension Neurology consult   Morbid obesity (BMI 40) Complicates overall prognosis   Hypertension Hold all antihypertensive medications for now Allow for permissive hypertension due to acute CVA   Diabetes mellitus Patient is n.p.o. until swallow function evaluation has been performed We will place on sliding scale coverage   DVT prophylaxis: SCD Code Status: Full code Family Communication: Greater than 50% of time was spent discussing  patient's condition and plan of care with him at the bedside.  All questions and concerns have been addressed.  He verbalizes understanding and agrees with the plan. Disposition Plan: Back to previous home environment Consults called: Neurology    Collier Bullock MD [facility]     06/05/2020, 3:59 PM

## 2020-06-05 NOTE — ED Provider Notes (Signed)
[facility] Emergency Department Provider Note  ____________________________________________   First MD Initiated Contact with Patient 06/05/20 1347     (approximate)  I have reviewed the triage vital signs and the nursing notes.   HISTORY  Chief Complaint Weakness and Aphasia   HPI Peter Ryan is a 69 y.o. male with a past medical history of obesity, BPH, HTN, DM, HDL, peripheral neuropathy, gout, and non-Hodgkin's lymphoma status post chemotherapy currently under surveillance who presents for assessment after developing some right-sided weakness that became more pronounced this morning when he got to the bath around 4 AM and fell onto his bottom.  Patient states he is not sure when yesterday he first noticed weakness in his right arm and right leg.  He does state he went to bed last night feeling weaker on the right side.  He also states his speech seems different although is not sure when exactly was normal was normal.  He denies hitting his head and is not on any blood thinners.  No history of CVA.  He states he has used a cane in the past for some neuropathy that was attributed to his cancer treatment.  He denies any recent fevers, chills, cough, chest pain, dental pain, nausea, vomiting, diarrhea, dysuria, blood in stool, blood in his urine, rash, or other recent falls or injuries.  Denies tobacco abuse or drug use.  Denies any known CAD or pulmonary problems.         Past Medical History:  Diagnosis Date  . Arthritis   . BPH (benign prostatic hyperplasia)   . Cancer ([place])    Non-Hodgkins lymphoma  . Chronic prostatitis   . Cirrhosis of liver ([place])   . Diabetes mellitus without complication ([place])   . Dyslipidemia   . Elevated PSA   . Gastric reflux   . Gout   . HTN (hypertension)   . Neuropathy   . Over weight   . Psoriasis   . Testicular pain, right   . Urinary frequency     Patient Active Problem List   Diagnosis Date Noted  .  Secondary malignant neoplasm of retroperitoneum and peritoneum ([place]) 05/02/2020  . Atherosclerosis of aorta ([place]) 05/02/2020  . Goals of care, counseling/discussion 04/08/2017  . 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 ([place]) 04/08/2017  . Ascites 02/08/2017  . Liver cirrhosis ([place]) 02/08/2017  . SBP (spontaneous bacterial peritonitis) ([place]) 02/07/2017  . 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  . DOE (dyspnea on exertion) 11/15/2016  . Obesity, morbid (more than 100 lbs over ideal weight or BMI > 40) ([place]) 11/15/2016  . Elevated PSA 05/30/2015  . BPH with obstruction/lower urinary tract symptoms 05/30/2015  . Type 2 diabetes mellitus ([place]) 05/18/2014  . Arthritis 01/03/2014  . Dyslipidemia 01/03/2014  . Benign essential hypertension 01/03/2014  . Gout 01/03/2014  . Psoriasis 01/03/2014  . Reflux 01/03/2014    Past Surgical History:  Procedure Laterality Date  . hydrocelectomy    . IR FLUORO GUIDE PORT INSERTION RIGHT  04/11/2017  . IR REMOVAL TUN ACCESS W/ PORT W/O FL MOD SED  05/29/2018  . PILONIDAL CYST EXCISION      Prior to Admission medications   Medication Sig Start Date End Date Taking? Authorizing Provider  allopurinol (ZYLOPRIM) 300 MG tablet TAKE ONE TABLET EVERY DAY 03/20/20   [name]., MD  amLODipine (NORVASC) 10 MG tablet TAKE ONE TABLET BY MOUTH EVERY DAY 06/05/20   [name].,  MD  aspirin EC 81 MG tablet Take 81 mg by mouth daily.     [provider]  Cholecalciferol (VITAMIN D3) 1000 UNITS CAPS Take 1,000 Units by mouth daily.     [provider]  finasteride (PROSCAR) 5 MG tablet TAKE ONE TABLET BY MOUTH EVERY DAY 05/31/20   McGowan, Larene [place] A, PA-C  hydrocortisone 2.5 % lotion APPLY A SMALL AMOUNT TO AFFECTED AREA 3 TIMES A WEEK 09/21/19   [provider]  ketoconazole (NIZORAL) 2 % shampoo SHAMPOO WITH A SMALL AMOUNT AS DIRECTED EVERY OTHER DAY TO DAILY, [place] FACE AND  SCALP, LET SIT SEVERAL MINUTES AND RINSE OFF 09/21/19   [provider]  losartan (COZAAR) 100 MG tablet TAKE ONE TABLET EVERY DAY BY MOUTH 03/28/20   [name]., MD  rosuvastatin (CRESTOR) 10 MG tablet Take 1 tablet (10 mg total) by mouth daily. Patient not taking: Reported on 01/04/2020 11/29/19   [name]., MD    Allergies Metformin  Family History  Problem Relation Age of Onset  . Uterine cancer Mother   . Diabetes Mother   . Cancer Mother   . Cancer Maternal Uncle   . Bladder Cancer Neg Hx   . Kidney disease Neg Hx   . Prostate cancer Neg Hx     Social History Social History   Tobacco Use  . Smoking status: Former Smoker    Packs/day: 1.50    Years: 8.00    Pack years: 12.00    Types: Cigarettes    Quit date: 05/29/1974    Years since quitting: 46.0  . Smokeless tobacco: Never Used  . Tobacco comment: quit 40 years ago  Vaping Use  . Vaping Use: Never used  Substance Use Topics  . Alcohol use: No    Alcohol/week: 0.0 standard drinks    Comment: rarely  . Drug use: No    Review of Systems  Review of Systems  Constitutional: Negative for chills and fever.  HENT: Negative for sore throat.   Eyes: Negative for pain.  Respiratory: Negative for cough and stridor.   Cardiovascular: Negative for chest pain.  Gastrointestinal: Negative for vomiting.  Genitourinary: Negative for dysuria.  Musculoskeletal: Positive for falls.  Skin: Negative for rash.  Neurological: Positive for speech change, focal weakness and weakness. Negative for seizures, loss of consciousness and headaches.  Psychiatric/Behavioral: Negative for suicidal ideas.  All other systems reviewed and are negative.     ____________________________________________   PHYSICAL EXAM:  VITAL SIGNS: ED Triage Vitals  Enc Vitals Group     BP 06/05/20 1054 (!) 133/59     Pulse Rate 06/05/20 1054 92     Resp 06/05/20 1054 16     Temp 06/05/20 1054 99.1 F (37.3 C)      Temp Source 06/05/20 1054 Oral     SpO2 06/05/20 1054 95 %     Weight 06/05/20 1051 284 lb 9.8 oz (129.1 kg)     Height 06/05/20 1051 5\' 10"  (1.778 m)     Head Circumference --      Peak Flow --      Pain Score 06/05/20 1051 0     Pain Loc --      Pain Edu? --      Excl. in [place]? --    Vitals:   06/05/20 1054  BP: (!) 133/59  Pulse: 92  Resp: 16  Temp: 99.1 F (37.3 C)  SpO2: 95%   Physical Exam Vitals  and nursing note reviewed.  Constitutional:      Appearance: He is well-developed.  HENT:     Head: Normocephalic and atraumatic.     Right Ear: External ear normal.     Left Ear: External ear normal.     Nose: Nose normal.     Mouth/Throat:     Mouth: Mucous membranes are moist.  Eyes:     Conjunctiva/sclera: Conjunctivae normal.  Cardiovascular:     Rate and Rhythm: Normal rate and regular rhythm.     Heart sounds: No murmur heard.   Pulmonary:     Effort: Pulmonary effort is normal. No respiratory distress.     Breath sounds: Normal breath sounds.  Abdominal:     Palpations: Abdomen is soft.     Tenderness: There is no abdominal tenderness.  Musculoskeletal:     Cervical back: Neck supple.  Skin:    General: Skin is warm and dry.     Capillary Refill: Capillary refill takes less than 2 seconds.  Neurological:     Mental Status: He is alert and oriented to person, place, and time.  Psychiatric:        Mood and Affect: Mood normal.     Patient has a right upper extremity pronator drift.  No finger dysmetria.  Patient does have a right-sided facial droop but otherwise cranial nerves are unremarkable.  Patient has 4/5 strength in the right upper extremity and right lower extremity compared to 5/5 throughout the left upper and left lower extremity.  Sensation is intact to light touch of all extremities. ____________________________________________   LABS (all labs ordered are listed, but only abnormal results are displayed)  Labs Reviewed  CBC - Abnormal;  Notable for the following components:      Result Value   WBC 11.4 (*)    RBC 3.99 (*)    Hemoglobin 11.9 (*)    HCT 34.0 (*)    All other components within normal limits  DIFFERENTIAL - Abnormal; Notable for the following components:   Neutro Abs 8.9 (*)    All other components within normal limits  COMPREHENSIVE METABOLIC PANEL - Abnormal; Notable for the following components:   Potassium 3.4 (*)    Glucose, Bld 252 (*)    Calcium 8.6 (*)    All other components within normal limits  PROTIME-INR  APTT  I-STAT CREATININE, ED  CBG MONITORING, ED   ____________________________________________  EKG  Normal sinus rhythm with a ventricular rate of 95, normal axis, unremarkable intervals, no evidence of acute ischemia or other significant underlying arrhythmia. ____________________________________________  RADIOLOGY  ED MD interpretation: No intracranial bleeding, large infarct, or other acute intracranial process.  Official radiology report(s): CT HEAD WO CONTRAST  Result Date: 06/05/2020 CLINICAL DATA:  Right-sided weakness for 2 days.  Fall this morning. EXAM: CT HEAD WITHOUT CONTRAST TECHNIQUE: Contiguous axial images were obtained from the base of the skull through the vertex without intravenous contrast. COMPARISON:  Limited correlation made with PET-CT 05/08/2018. FINDINGS: Brain: There is no evidence of acute intracranial hemorrhage, mass lesion, brain edema or extra-axial fluid collection. The ventricles and subarachnoid spaces are appropriately sized for age. There is no CT evidence of acute cortical infarction. There is patchy and confluent low-density in the periventricular white matter bilaterally, most consistent with chronic small vessel ischemic changes. There is mild involvement of the right basal ganglia and left thalamus. Vascular: Intracranial vascular calcifications. No hyperdense vessel identified. Skull: Negative for fracture or focal lesion. Sinuses/Orbits: The  visualized paranasal sinuses and mastoid air cells are clear. No orbital abnormalities are seen. Other: None. IMPRESSION: 1. No acute intracranial findings. 2. Chronic small vessel ischemic changes in the periventricular white matter and basal ganglia. Electronically Signed   By: Richardean Sale M.D.   On: 06/05/2020 11:38    ____________________________________________   PROCEDURES  Procedure(s) performed (including Critical Care):  .1-3 Lead EKG Interpretation Performed by: Lucrezia Starch, MD Authorized by: Lucrezia Starch, MD     Interpretation: normal     ECG rate assessment: normal     Rhythm: sinus rhythm     Ectopy: none     Conduction: normal       ____________________________________________   INITIAL IMPRESSION / ASSESSMENT AND PLAN / ED COURSE        Overall patient's history, exam, and the work-up is concerning for acute ischemic stroke.  Time of onset is likely greater than 12 hours patient states he definitely had deficits for going to bed last night.  Patient is afebrile and hemodynamically stable on arrival.  Deficits as described above.  CT head without any evidence of hemorrhagic stroke or other acute intracranial process.  No significant metabolic derangements noted on above labs although patient is known to be hyperglycemic.  History exam and work-up is not consistent with acute traumatic injury, toxic ingestion, acute infectious process, or significant metabolic derangement.  I did discuss patient's presentation with on-call neurologist who recommended vessel imaging as well as MRI and agreed with hospital admission for further work-up.  Patient given ASA.  Will plan to admit to hospital service.    FINAL CLINICAL IMPRESSION(S) / ED DIAGNOSES  Final diagnoses:  Weakness  Facial droop  Hyperglycemia    Medications  sodium chloride flush (NS) 0.9 % injection 3 mL (has no administration in time range)  aspirin EC tablet 325 mg (has no administration in  time range)     ED Discharge Orders    None       Note:  This document was prepared using Dragon voice recognition software and may include unintentional dictation errors.   Lucrezia Starch, MD 06/05/20 [phone]

## 2020-06-05 NOTE — Consult Note (Signed)
Referring Physician: Dr. Tamala Julian      Chief Complaint: Right sided weakness   HPI: Peter Ryan is an 69 y.o. male with a PMHx of NHL, arthritis, cirrhosis of liver, DM, dyslipidemia, HTN, neuropathy and obesity who presents with right sided weakness. He first noticed the weakness 2 days ago. On awakening today at 0400, he fell due to the weakness. EMS was called and helped him back up; he remained at home. EMS was called again at about 11 AM due to continued weakness. At baseline he uses a walker due to weakness of bilateral lower extremities during ambulation, which has been gradually worsening over a long period of time. He feels that the chronic component of his ambulatory difficulty is due to his chronic neuropathy. He has not been able to walk, even with his walker, since the fall this morning. Per EMS, his vitals were normal but CBG was elevated at 218.   On arrival to the ED, right facial droop, slurred speech and right arm drift were noted.   Home medications include ASA and rosuvastatin.   LSN: 2 days ago tPA Given: No: Out of time window  Past Medical History:  Diagnosis Date  . Arthritis   . BPH (benign prostatic hyperplasia)   . Cancer ([place])    Non-Hodgkins lymphoma  . Chronic prostatitis   . Cirrhosis of liver ([place])   . Diabetes mellitus without complication ([place])   . Dyslipidemia   . Elevated PSA   . Gastric reflux   . Gout   . HTN (hypertension)   . Neuropathy   . Over weight   . Psoriasis   . Testicular pain, right   . Urinary frequency     Past Surgical History:  Procedure Laterality Date  . hydrocelectomy    . IR FLUORO GUIDE PORT INSERTION RIGHT  04/11/2017  . IR REMOVAL TUN ACCESS W/ PORT W/O FL MOD SED  05/29/2018  . PILONIDAL CYST EXCISION      Family History  Problem Relation Age of Onset  . Uterine cancer Mother   . Diabetes Mother   . Cancer Mother   . Cancer Maternal Uncle   . Bladder Cancer Neg Hx   . Kidney disease Neg Hx   . Prostate  cancer Neg Hx    Social History:  reports that he quit smoking about 46 years ago. His smoking use included cigarettes. He has a 12.00 pack-year smoking history. He has never used smokeless tobacco. He reports that he does not drink alcohol and does not use drugs.  Allergies:  Allergies  Allergen Reactions  . Metformin Other (See Comments)    Stomach upset     Home Medications:  Current Facility-Administered Medications on File Prior to Encounter  Medication Dose Route Frequency Provider Last Rate Last Admin  . sodium chloride flush (NS) 0.9 % injection 10 mL  10 mL Intravenous PRN Sindy Guadeloupe, MD   10 mL at 05/12/18 1421   Current Outpatient Medications on File Prior to Encounter  Medication Sig Dispense Refill  . allopurinol (ZYLOPRIM) 300 MG tablet TAKE ONE TABLET EVERY DAY 30 tablet 3  . amLODipine (NORVASC) 10 MG tablet TAKE ONE TABLET BY MOUTH EVERY DAY 30 tablet 5  . aspirin EC 81 MG tablet Take 81 mg by mouth daily.     . Cholecalciferol (VITAMIN D3) 1000 UNITS CAPS Take 1,000 Units by mouth daily.     . finasteride (PROSCAR) 5 MG tablet TAKE ONE TABLET BY MOUTH EVERY  DAY 90 tablet 3  . hydrocortisone 2.5 % lotion APPLY A SMALL AMOUNT TO AFFECTED AREA 3 TIMES A WEEK    . ketoconazole (NIZORAL) 2 % shampoo SHAMPOO WITH A SMALL AMOUNT AS DIRECTED EVERY OTHER DAY TO DAILY, WASH FACE AND SCALP, LET SIT SEVERAL MINUTES AND RINSE OFF    . losartan (COZAAR) 100 MG tablet TAKE ONE TABLET EVERY DAY BY MOUTH 90 tablet 1  . rosuvastatin (CRESTOR) 10 MG tablet Take 1 tablet (10 mg total) by mouth daily. (Patient not taking: Reported on 01/04/2020) 30 tablet 0    ROS: Had bloody diarrhea one week ago that has improved, but still with some blood in stools. No vision changes, trouble with language, CP or SOB. Other symptoms as per HPI with comprehensive ROS otherwise negative.   Physical Examination: Blood pressure (!) 133/59, pulse 92, temperature 99.1 F (37.3 C), temperature source  Oral, resp. rate 16, height 5\' 10"  (1.778 m), weight 129.1 kg, SpO2 95 %.  HEENT: [place]/AT Lungs: Respirations unlabored  Ext: Chronic pigmentary changes to distal BLE  Neurologic Examination: Mental Status: Alert, fully oriented, thought content appropriate, pleasant and cooperative.  Speech fluent with intact naming and comprehension. Repetition intact. Mild dysarthria is noted.   Cranial Nerves: II:  Visual fields intact with no extinction to DSS. Pupils equal.  III,IV, VI: No ptosis. Mild exotropia (chronic). EOMI with saccadic pursuits noted. No nystagmus.  V,VII: Temp sensation equal bilaterally. Right facial droop.  VIII: Hearing intact to voice IX,X: No hypophonia XI: Symmetric XII: Midline tongue extension  Motor: RUE 4+/5 RLE 4+/5 LUE and LLE 5/5 Sensory: Temp sensation intact to proximal upper and lower extremities bilaterally. No extinction to DSS.  Deep Tendon Reflexes:  1+ bilateral brachioradialis and biceps.  2+ bilateral patellae.  Trace achilles bilaterally Right toe upgoing. Left toe downgoing.  Cerebellar: No ataxia with FNF bilaterally. Mild ataxia with right H-S. Left H-S is normal.  Gait: Deferred due to falls risk concerns   Results for orders placed or performed during the hospital encounter of 06/05/20 (from the past 48 hour(s))  Protime-INR     Status: None   Collection Time: 06/05/20 11:00 AM  Result Value Ref Range   Prothrombin Time 12.9 11.4 - 15.2 seconds   INR 1.0 0.8 - 1.2    Comment: (NOTE) INR goal varies based on device and disease states. Performed at [facility], [address] [place], [place] [address]   APTT     Status: None   Collection Time: 06/05/20 11:00 AM  Result Value Ref Range   aPTT 29 24 - 36 seconds    Comment: Performed at [facility], [address] [place], [place] [address]  CBC     Status: Abnormal   Collection Time: 06/05/20 11:00 AM  Result Value Ref Range   WBC 11.4 (H) 4.0 - 10.5 K/uL    RBC 3.99 (L) 4.22 - 5.81 MIL/uL   Hemoglobin 11.9 (L) 13.0 - 17.0 g/dL   HCT 34.0 (L) 39 - 52 %   MCV 85.2 80.0 - 100.0 fL   MCH 29.8 26.0 - 34.0 pg   MCHC 35.0 30.0 - 36.0 g/dL   RDW 13.8 11.5 - 15.5 %   Platelets 254 150 - 400 K/uL   nRBC 0.0 0.0 - 0.2 %    Comment: Performed at [facility], [address] [place], [place] [address]  Differential     Status: Abnormal   Collection Time: 06/05/20 11:00  AM  Result Value Ref Range   Neutrophils Relative % 77 %   Neutro Abs 8.9 (H) 1.7 - 7.7 K/uL   Lymphocytes Relative 13 %   Lymphs Abs 1.4 0.7 - 4.0 K/uL   Monocytes Relative 7 %   Monocytes Absolute 0.8 0 - 1 K/uL   Eosinophils Relative 1 %   Eosinophils Absolute 0.1 0 - 0 K/uL   Basophils Relative 1 %   Basophils Absolute 0.1 0 - 0 K/uL   Immature Granulocytes 1 %   Abs Immature Granulocytes 0.06 0.00 - 0.07 K/uL    Comment: Performed at [facility], [address], [place], [place] [address]  Comprehensive metabolic panel     Status: Abnormal   Collection Time: 06/05/20 11:00 AM  Result Value Ref Range   Sodium 140 135 - 145 mmol/L   Potassium 3.4 (L) 3.5 - 5.1 mmol/L   Chloride 103 98 - 111 mmol/L   CO2 22 22 - 32 mmol/L   Glucose, Bld 252 (H) 70 - 99 mg/dL    Comment: Glucose reference range applies only to samples taken after fasting for at least 8 hours.   BUN 19 8 - 23 mg/dL   Creatinine, Ser 0.81 0.61 - 1.24 mg/dL   Calcium 8.6 (L) 8.9 - 10.3 mg/dL   Total Protein 7.2 6.5 - 8.1 g/dL   Albumin 3.9 3.5 - 5.0 g/dL   AST 27 15 - 41 U/L   ALT 21 0 - 44 U/L   Alkaline Phosphatase 56 38 - 126 U/L   Total Bilirubin 0.7 0.3 - 1.2 mg/dL   GFR calc non Af Amer >60 >60 mL/min   GFR calc Af Amer >60 >60 mL/min   Anion gap 15 5 - 15    Comment: Performed at [facility], [address], [place], [place] [address]   CT HEAD WO CONTRAST  Result Date: 06/05/2020 CLINICAL DATA:  Right-sided weakness for 2 days.  Fall this morning. EXAM: CT  HEAD WITHOUT CONTRAST TECHNIQUE: Contiguous axial images were obtained from the base of the skull through the vertex without intravenous contrast. COMPARISON:  Limited correlation made with PET-CT 05/08/2018. FINDINGS: Brain: There is no evidence of acute intracranial hemorrhage, mass lesion, brain edema or extra-axial fluid collection. The ventricles and subarachnoid spaces are appropriately sized for age. There is no CT evidence of acute cortical infarction. There is patchy and confluent low-density in the periventricular white matter bilaterally, most consistent with chronic small vessel ischemic changes. There is mild involvement of the right basal ganglia and left thalamus. Vascular: Intracranial vascular calcifications. No hyperdense vessel identified. Skull: Negative for fracture or focal lesion. Sinuses/Orbits: The visualized paranasal sinuses and mastoid air cells are clear. No orbital abnormalities are seen. Other: None. IMPRESSION: 1. No acute intracranial findings. 2. Chronic small vessel ischemic changes in the periventricular white matter and basal ganglia. Electronically Signed   By: Richardean Sale M.D.   On: 06/05/2020 11:38    Assessment: 69 y.o. male presenting with new onset of right sided weakness. Symptoms were first noted 2 days ago.  1. Exam reveals right face, arm and leg weakness with mild RLE ataxia. Findings best localize to the right internal capsule, basal ganglia or thalamus.  2. CT head shows no acute abnormalities. Chronic small vessel ischemic changes are noted.  3. Stroke Risk Factors - Cancer history, DM, dyslipidemia, HTN and obesity   Recommendations: 1. HgbA1c, fasting lipid panel 2. MRI, MRA of the brain without  contrast 3. PT consult, OT consult, Speech consult 4. Echocardiogram 5. Carotid dopplers 6. Prophylactic therapy-Increase ASA to 325 mg po qd 7. Change rosuvastatin to atorvastatin 40 mg po qd 8. Telemetry monitoring 9. Frequent neuro checks 10. BP  management. Out of the permissive HTN time window.    @Electronically  signed: Dr. Kerney Elbe 06/05/2020, 2:12 PM

## 2020-06-05 NOTE — ED Notes (Signed)
Pt transported to CT ?

## 2020-06-06 ENCOUNTER — Encounter: Payer: Self-pay | Admitting: Internal Medicine

## 2020-06-06 DIAGNOSIS — I639 Cerebral infarction, unspecified: Secondary | ICD-10-CM

## 2020-06-06 LAB — LIPID PANEL
Cholesterol: 170 mg/dL (ref 0–200)
HDL: 29 mg/dL — ABNORMAL LOW (ref >40)
LDL Cholesterol: 77 mg/dL (ref 0–99)
Total CHOL/HDL Ratio: 5.9 RATIO
Triglycerides: 321 mg/dL — ABNORMAL HIGH (ref <150)
VLDL: 64 mg/dL — ABNORMAL HIGH (ref 0–40)

## 2020-06-06 LAB — ECHOCARDIOGRAM COMPLETE
AR max vel: 3.27 cm2
AV Peak grad: 5.2 mmHg
Ao pk vel: 1.14 m/s
Area-P 1/2: 4.71 cm2
Height: 70 in
S' Lateral: 4.02 cm
Weight: 4553.82 oz

## 2020-06-06 LAB — HEMOGLOBIN A1C
Hgb A1c MFr Bld: 7.5 % — ABNORMAL HIGH (ref 4.8–5.6)
Mean Plasma Glucose: 168.55 mg/dL

## 2020-06-06 LAB — GLUCOSE, CAPILLARY
Glucose-Capillary: 133 mg/dL — ABNORMAL HIGH (ref 70–99)
Glucose-Capillary: 149 mg/dL — ABNORMAL HIGH (ref 70–99)
Glucose-Capillary: 150 mg/dL — ABNORMAL HIGH (ref 70–99)
Glucose-Capillary: 154 mg/dL — ABNORMAL HIGH (ref 70–99)
Glucose-Capillary: 161 mg/dL — ABNORMAL HIGH (ref 70–99)
Glucose-Capillary: 180 mg/dL — ABNORMAL HIGH (ref 70–99)

## 2020-06-06 LAB — HIV ANTIBODY (ROUTINE TESTING W REFLEX): HIV Screen 4th Generation wRfx: NONREACTIVE

## 2020-06-06 MED ORDER — CLOPIDOGREL BISULFATE 75 MG PO TABS: 75.0000 mg | ORAL_TABLET | Freq: Every day | ORAL | Status: DC

## 2020-06-06 NOTE — Evaluation (Addendum)
Speech Language Pathology Evaluation Patient Details Name: Peter Ryan MRN: [id number] DOB: 1951/05/07 Today's Date: 06/06/2020 Time: 0930-1030 SLP Time Calculation (min) (ACUTE ONLY): 60 min  Problem List:  Patient Active Problem List   Diagnosis Date Noted  . Acute CVA (cerebrovascular accident) ([place]) 06/05/2020  . Obesity, Class III, BMI 40-49.9 (morbid obesity) ([place]) 06/05/2020  . Secondary malignant neoplasm of retroperitoneum and peritoneum ([place]) 05/02/2020  . Atherosclerosis of aorta ([place]) 05/02/2020  . Goals of care, counseling/discussion 04/08/2017  . 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 ([place]) 04/08/2017  . Ascites 02/08/2017  . Liver cirrhosis ([place]) 02/08/2017  . SBP (spontaneous bacterial peritonitis) ([place]) 02/07/2017  . 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  . DOE (dyspnea on exertion) 11/15/2016  . Obesity, morbid (more than 100 lbs over ideal weight or BMI > 40) ([place]) 11/15/2016  . Elevated PSA 05/30/2015  . BPH with obstruction/lower urinary tract symptoms 05/30/2015  . Type 2 diabetes mellitus ([place]) 05/18/2014  . Arthritis 01/03/2014  . Dyslipidemia 01/03/2014  . Benign essential hypertension 01/03/2014  . Gout 01/03/2014  . Psoriasis 01/03/2014  . Reflux 01/03/2014   Past Medical History:  Past Medical History:  Diagnosis Date  . Arthritis   . BPH (benign prostatic hyperplasia)   . Cancer ([place])    Non-Hodgkins lymphoma  . Chronic prostatitis   . Cirrhosis of liver ([place])   . Diabetes mellitus without complication ([place])   . Dyslipidemia   . Elevated PSA   . Gastric reflux   . Gout   . HTN (hypertension)   . Neuropathy   . Over weight   . Psoriasis   . Testicular pain, right   . Urinary frequency    Past Surgical History:  Past Surgical History:  Procedure Laterality Date  . hydrocelectomy    . IR FLUORO GUIDE PORT INSERTION RIGHT  04/11/2017  . IR REMOVAL TUN ACCESS W/ PORT W/O FL MOD SED   05/29/2018  . PILONIDAL CYST EXCISION     HPI:  Pt is a 69 y.o. male with medical history significant for obesity, BPH, hypertension, diabetes mellitus, dyslipidemia and non-Hodgkin's lymphoma status post chemotherapy currently under surveillance who presents to the emergency room for evaluation of right-sided weakness which started 1 day prior to his presentation but was more pronounced on the day of admission when he got up to use the bathroom and fell on his bottom. Wife reports speech is slurred. Pt denies hitting his head when he fell. He denies having any difficulty swallowing, blurred vision or headache. MRI Findings (06/05/20): Acute left pontine and right frontal white matter infarcts. Severe chronic small vessel ischemic disease.   Assessment / Plan / Recommendation Clinical Impression  Pt was administered the Western Aphasia Battery bedside screening tool; distractions were minimized as able. Pt appears to present w/ grossly [facility] cognitive-linguistic function w/ no apparent receptive or expressive aphasia noted during this screening. Pt appears to present w/ mod reduced intelligibility at phrase/conversation level secondary to Dysarthria s/p acute CVA (see MRI results).  Results found during the screening: Spontaneous Speech Content WFL; Spontaneous Speech Fluency WFL; Auditory Verbal Comprehension (Y/N questions) WFL; Sequential Commands WFL; Repetition WFL; Object Naming WFL; Reading WFL; Oral Apraxia WFL. Writing NT d/t R-side weakness & baseline R-hand dominance in pt; pt attempted to write name using R hand & was able to correctly sequence letters in writing & confirm sequencing abilities w/ clinicians via oral recitation of letters within his name. Cognitive-linguistic aspects of screening were  WFL-- pt was A&O x4. However, overall intelligibility for tasks involving speech (reading, spontaneous speech, repetition) was impacted d/t Dysarthria: intelligibility min impaired at the word level &  mod impaired at sentence, phrase, and conversation level. At the word level, labial consonants are most impacted by dysarthria/R-sided labial weakness. Decreased intelligibility observed w/ increased complexity and length of sentences during Reading task. Given verbal & visual cues to use compensatory strategies including over-articulation, reduced rate of speech, and increased loudness during a reading task, intelligibility increased at the word-short phrase level. Noted slight disfluency intermittently as pt attempted increased length of utterance during conversation & tasks. Oral-motor exam revealed adequate tongue protrusion/posterior strength, & ROM. R-side labial weakness & decreased tone noted, labial asymmetry & decreased ROM during tasks.  Pt consulted re goals & continuation of SLP services; indicated interest in continuation of SLP services for addressing Dysarthria-- increasing intelligibility & overall fluency of speech for functional use in ADL's & communication w/ others. Pt was given handouts & education on over-articulation strategies.      SLP Assessment  SLP Recommendation/Assessment: Patient needs continued Speech Lanaguage Pathology Services SLP Visit Diagnosis: Dysarthria and anarthria (R47.1)    Follow Up Recommendations  Outpatient SLP    Frequency and Duration TBD at next venue of care     SLP Evaluation Cognition  Overall Cognitive Status: Within Functional Limits for tasks assessed Arousal/Alertness: Awake/alert Memory: Appears intact Immediate Memory Recall:  (wfl) Memory Recall Sock:  (NT) Memory Recall Blue:  (NT) Memory Recall Bed:  (NT) Problem Solving:  (NT) Executive Function: Sequencing;Reasoning;Organizing (wfl) Reasoning: Appears intact Sequencing: Appears intact Organizing: Appears intact       Comprehension  Auditory Comprehension Overall Auditory Comprehension: Appears within functional limits for tasks assessed Yes/No Questions: Within  Functional Limits Commands: Within Functional Limits Conversation: Simple Other Conversation Comments: overall decreased intelligibility d/t r sided labial weakness causing dysarthric speech EffectiveTechniques: Increased volume;Slowed speech;Stressing words (overarticulation) Reading Comprehension Reading Status: Within funtional limits    Expression Expression Primary Mode of Expression: Verbal Verbal Expression Overall Verbal Expression: Appears within functional limits for tasks assessed Automatic Speech: Name;Social Response Level of Generative/Spontaneous Verbalization: Word;Phrase;Sentence;Conversation Repetition: No impairment Naming: No impairment Interfering Components: Anatomical limitation;Speech intelligibility Effective Techniques: Articulatory cues Non-Verbal Means of Communication: Not applicable Written Expression Dominant Hand: Right Written Expression: Unable to assess (comment) (Wrote his name & lettering correctly sequenced; R hand weak)   Oral / Motor  Oral Motor/Sensory Function Overall Oral Motor/Sensory Function: Moderate impairment Facial ROM: Reduced right (labial) Facial Symmetry: Abnormal symmetry right (L pontine infarct) Facial Strength: Within Functional Limits;Reduced right Lingual ROM: Within Functional Limits Lingual Symmetry: Within Functional Limits Lingual Strength: Within Functional Limits [place]: Within Functional Limits Mandible: Within Functional Limits Motor Speech Overall Motor Speech: Impaired Respiration: Within functional limits Phonation: Normal Resonance: Within functional limits Articulation: Impaired Level of Impairment: Word Intelligibility: Intelligibility reduced Word: 25-49% accurate (intelligibility) Phrase: 25-49% accurate Sentence: 0-24% accurate Conversation: 0-24% accurate Motor Planning: Witnin functional limits Motor Speech Errors: Aware;Consistent Interfering Components: Anatomical limitations Effective  Techniques: Slow rate;Increased vocal intensity;Over-articulate;Pacing (Tested at end of eval)   GO                    Quintella Baton  Graduate Clinician  06/06/2020, 11:43 AM   The information in this patient note, response to treatment, and overall treatment plan developed has been reviewed and agreed upon by this clinician.  Orinda Kenner, [place], [place] Speech Language Pathologist Rehab Services [phone]  06/07/20,8:14  AM

## 2020-06-06 NOTE — TOC Progression Note (Signed)
Transition of Care [facility]) - Progression Note    Patient Details  Name: Peter Ryan MRN: [id number] Date of Birth: [birth year]  [facility]) CM/SW [place], RN Phone Number: 06/06/2020, 2:55 PM  Clinical Narrative:   RNCM met with patient and wife at bedside, wife speaking with someone on phone. Patient verbalizes that he is somewhat tired today but is thankful that he is getting the help he needs. Patient has slurred speech but can make himself understood. He is agreeable to having assessment for CIR and is hopeful he will qualify. RNCM will continue to follow for needs.          Expected Discharge Plan and Services                                                 Social Determinants of Health (SDOH) Interventions    Readmission Risk Interventions No flowsheet data found.

## 2020-06-06 NOTE — Consult Note (Addendum)
Physical Medicine and Rehabilitation Consult Reason for Consult: Impaired mobility and ADLs Referring Physician: Jennye Boroughs, MD   HPI: Peter Ryan is a 69 y.o. male with PMH of obesity, BPH, HTN, DM, dyslipidemia, and non-Hodgkin's lymphoma s/p chemotherapy who presented to the ED with right sided weakness. He was found to have R pontine and L frontal infarcts. He is married. He is highly motivated in therapy. Continues to have right sided weakness, decreased coordination, slurred speech, numbness. Physical Medicine & Rehabilitation was consulted to assess candidacy for CIR.   Review of Systems  Constitutional: Negative for chills and fever.  HENT: Negative for hearing loss and tinnitus.   Eyes: Negative for blurred vision and double vision.  Respiratory: Negative for cough and hemoptysis.   Cardiovascular: Negative for chest pain and palpitations.  Gastrointestinal: Negative for heartburn and nausea.  Genitourinary: Negative for dysuria and urgency.  Musculoskeletal: Negative for myalgias and neck pain.  Skin: Negative for rash.  Neurological: Positive for speech change and focal weakness.  Endo/Heme/Allergies: Negative for environmental allergies. Does not bruise/bleed easily.  Psychiatric/Behavioral: Negative for depression and suicidal ideas.  + difficulty swallowing and bloody diarrhea prior to admission  Past Medical History:  Diagnosis Date  . Arthritis   . BPH (benign prostatic hyperplasia)   . Cancer ([place])    Non-Hodgkins lymphoma  . Chronic prostatitis   . Cirrhosis of liver ([place])   . Diabetes mellitus without complication ([place])   . Dyslipidemia   . Elevated PSA   . Gastric reflux   . Gout   . HTN (hypertension)   . Neuropathy   . Over weight   . Psoriasis   . Testicular pain, right   . Urinary frequency    Past Surgical History:  Procedure Laterality Date  . hydrocelectomy    . IR FLUORO GUIDE PORT INSERTION RIGHT  04/11/2017  . IR REMOVAL TUN  ACCESS W/ PORT W/O FL MOD SED  05/29/2018  . PILONIDAL CYST EXCISION     Family History  Problem Relation Age of Onset  . Uterine cancer Mother   . Diabetes Mother   . Cancer Mother   . Cancer Maternal Uncle   . Bladder Cancer Neg Hx   . Kidney disease Neg Hx   . Prostate cancer Neg Hx    Social History:  reports that he quit smoking about 46 years ago. His smoking use included cigarettes. He has a 12.00 pack-year smoking history. He has never used smokeless tobacco. He reports that he does not drink alcohol and does not use drugs. Allergies:  Allergies  Allergen Reactions  . Metformin Other (See Comments)    Stomach upset    Medications Prior to Admission  Medication Sig Dispense Refill  . allopurinol (ZYLOPRIM) 300 MG tablet TAKE ONE TABLET EVERY DAY (Patient taking differently: Take 300 mg by mouth daily. ) 30 tablet 3  . amLODipine (NORVASC) 10 MG tablet TAKE ONE TABLET BY MOUTH EVERY DAY (Patient taking differently: Take 10 mg by mouth at bedtime. ) 30 tablet 5  . aspirin EC 81 MG tablet Take 81 mg by mouth daily.     . Cholecalciferol (VITAMIN D3) 1000 UNITS CAPS Take 1,000 Units by mouth daily.     . finasteride (PROSCAR) 5 MG tablet TAKE ONE TABLET BY MOUTH EVERY DAY (Patient taking differently: Take 5 mg by mouth daily. ) 90 tablet 3  . ketoconazole (NIZORAL) 2 % cream Apply 1 application topically daily.    Marland Kitchen  ketoconazole (NIZORAL) 2 % shampoo Apply 1 application topically every other day. (wash face and scalp)    . losartan (COZAAR) 100 MG tablet TAKE ONE TABLET EVERY DAY BY MOUTH (Patient taking differently: Take 100 mg by mouth at bedtime. ) 90 tablet 1  . vitamin B-12 (CYANOCOBALAMIN) 1000 MCG tablet Take 1,000 mcg by mouth daily.      Home: Home Living Family/patient expects to be discharged to:: Inpatient rehab Living Arrangements: Spouse/significant other Available Help at Discharge: Family Type of Home: House Home Access: Stairs to enter Engineer, site of Steps: 4 Entrance Stairs-Rails: Right Home Layout: One level Home Equipment: Environmental consultant - 4 wheels, Radio producer - single point  Lives With: Spouse (wife)  Functional History: Prior Function Level of Independence: Independent with assistive device(s) Comments: Pt had been able to do moderate community ambulation, has been seeing outpt PT for balance, strength, LBP Functional Status:  Mobility: Bed Mobility Overal bed mobility: Needs Assistance Bed Mobility: Sidelying to Sit, Supine to Sit Sidelying to sit: Max assist Supine to sit: Max assist General bed mobility comments: Pt made good effort with getting to and from EOB but did need heavy assist to get trunk to upright.  Poor balance with lean back and to the R, even with heavy assist to square to EOB and cuing for UE use he struggled to maintain static sitting Transfers Overall transfer level: Needs assistance Equipment used: Rolling walker (2 wheeled) Transfers: Sit to/from Stand Sit to Stand: Mod assist, Max assist General transfer comment: Pt showed excellent effort with attempts to get to standing.  First attempt w/o assist from low bed setting was a non-starter.  Heavy cuing for set up and sequencing/positioning with ~2" raised bed and with consistent mod assist to keep hips forward he was able to attain near standing but could not shift weight over the walker and actually attain standing w/o direct assist.  Adjusted walker down for next attempt with increased ability to put weight though it once but but still needing considerable assist to keep weight forward during transition  Ambulation/Gait General Gait Details: Pt was able to manage a few inconsistent step-in-place (R > L due to poor WBing tolerance on R) and side shuffle steps but no true ambulation/steps    ADL:    Cognition: Cognition Overall Cognitive Status: Within Functional Limits for tasks assessed Arousal/Alertness: Awake/alert Orientation Level: Oriented  X4 Memory: Appears intact Immediate Memory Recall:  (wfl) Memory Recall Sock:  (NT) Memory Recall Blue:  (NT) Memory Recall Bed:  (NT) Problem Solving:  (NT) Executive Function: Sequencing, Reasoning, Armed forces logistics/support/administrative officer (wfl) Reasoning: Appears intact Sequencing: Appears intact Organizing: Appears intact Cognition Arousal/Alertness: Awake/alert Behavior During Therapy: WFL for tasks assessed/performed Overall Cognitive Status: Within Functional Limits for tasks assessed  Blood pressure (!) 150/85, pulse 90, temperature 98.7 F (37.1 C), temperature source Oral, resp. rate 18, height 5\' 10"  (1.778 m), weight 129.1 kg, SpO2 97 %. Physical Exam  General: Alert and oriented x 3, No apparent distress HEENT: Head is normocephalic, atraumatic, PERRLA, EOMI, sclera anicteric, oral mucosa pink and moist, dentition intact, ext ear canals clear,  Neck: Supple without JVD or lymphadenopathy Heart: Reg rate and rhythm. No murmurs rubs or gallops Chest: CTA bilaterally without wheezes, rales, or rhonchi; no distress Abdomen: Soft, non-tender, non-distended, bowel sounds positive. Extremities: No clubbing, cyanosis, or edema. Pulses are 2+ Skin: Clean and intact without signs of breakdown Neuro: Pt is cognitively appropriate with normal insight, memory, and awareness. Slurred speech.  Motor function  is grossly 4/5 right side.  Psych: Pt's affect is appropriate. Pt is cooperative  Results for orders placed or performed during the hospital encounter of 06/05/20 (from the past 24 hour(s))  SARS Coronavirus 2 by RT PCR (hospital order, performed in [place] hospital lab) Nasopharyngeal Nasopharyngeal Swab     Status: None   Collection Time: 06/05/20  2:39 PM   Specimen: Nasopharyngeal Swab  Result Value Ref Range   SARS Coronavirus 2 NEGATIVE NEGATIVE  Glucose, capillary     Status: Abnormal   Collection Time: 06/05/20  4:14 PM  Result Value Ref Range   Glucose-Capillary 166 (H) 70 - 99 mg/dL    Glucose, capillary     Status: Abnormal   Collection Time: 06/05/20  8:24 PM  Result Value Ref Range   Glucose-Capillary 166 (H) 70 - 99 mg/dL  Glucose, capillary     Status: Abnormal   Collection Time: 06/06/20 12:53 AM  Result Value Ref Range   Glucose-Capillary 161 (H) 70 - 99 mg/dL   Comment 1 Notify RN   Lipid panel     Status: Abnormal   Collection Time: 06/06/20  4:13 AM  Result Value Ref Range   Cholesterol 170 0 - 200 mg/dL   Triglycerides 321 (H) <150 mg/dL   HDL 29 (L) >40 mg/dL   Total CHOL/HDL Ratio 5.9 RATIO   VLDL 64 (H) 0 - 40 mg/dL   LDL Cholesterol 77 0 - 99 mg/dL  Glucose, capillary     Status: Abnormal   Collection Time: 06/06/20  5:26 AM  Result Value Ref Range   Glucose-Capillary 149 (H) 70 - 99 mg/dL  Glucose, capillary     Status: Abnormal   Collection Time: 06/06/20  7:37 AM  Result Value Ref Range   Glucose-Capillary 150 (H) 70 - 99 mg/dL  Glucose, capillary     Status: Abnormal   Collection Time: 06/06/20 11:40 AM  Result Value Ref Range   Glucose-Capillary 180 (H) 70 - 99 mg/dL   CT Angio Head W or Wo Contrast  Result Date: 06/05/2020 CLINICAL DATA:  Right-sided weakness for 2 days with a fall today. Facial droop and slurred speech. EXAM: CT ANGIOGRAPHY HEAD AND NECK TECHNIQUE: Multidetector CT imaging of the head and neck was performed using the standard protocol during bolus administration of intravenous contrast. Multiplanar CT image reconstructions and MIPs were obtained to evaluate the vascular anatomy. Carotid stenosis measurements (when applicable) are obtained utilizing NASCET criteria, using the distal internal carotid diameter as the denominator. CONTRAST:  66mL OMNIPAQUE IOHEXOL 350 MG/ML SOLN COMPARISON:  None. FINDINGS: CTA NECK FINDINGS Aortic arch: Standard 3 vessel aortic arch with widely patent arch vessel origins. Right carotid system: Patent with mild calcified and soft plaque at the carotid bifurcation. No evidence of significant  stenosis or dissection. Left carotid system: Patent with mild calcified and soft plaque at the carotid bifurcation. No evidence of significant stenosis or dissection. Vertebral arteries: The vertebral arteries are patent with the right being strongly dominant. There is the suggestion of a moderate proximal right V1 stenosis although shoulder artifact may also be contributing to this appearance. Skeleton: Cervical spondylosis most notable at C6-7 where asymmetric left uncovertebral spurring results in moderate left neural foraminal stenosis. Other neck: No evidence of cervical lymphadenopathy or mass. Upper chest: Clear lung apices. Review of the MIP images confirms the above findings CTA HEAD FINDINGS Anterior circulation: The internal carotid arteries are patent from skull base to carotid termini with mild atherosclerotic plaque  bilaterally not resulting in significant stenosis. ACAs and MCAs are patent without evidence of a proximal branch occlusion or significant A1 or M1 stenosis. There is atherosclerotic irregularity of the branch vessels as well as a focal severe proximal left M2 branch stenosis. There is a median artery of the corpus callosum with a severe stenosis distally. No aneurysm is identified. Posterior circulation: The intracranial vertebral arteries are patent to the basilar with mild atherosclerotic narrowing on the right. The left V4 segment is diffusely small with multiple superimposed stenoses including a severe stenosis distally. Patent AICA and SCA origins are visualized bilaterally. The basilar artery is widely patent. There is a fetal origin of the right PCA. Both PCAs demonstrate mild diffuse atherosclerotic irregularity without evidence of a flow limiting proximal stenosis. No aneurysm is identified. Venous sinuses: Patent. Anatomic variants: Strongly dominant right vertebral artery. Median artery of the corpus callosum. Review of the MIP images confirms the above findings IMPRESSION: 1.  No large vessel occlusion. 2. Intracranial atherosclerosis including severe stenoses of a left M2 branch vessel and of the median artery of the corpus callosum. 3. Strongly dominant right vertebral artery with possible moderate proximal V1 stenosis. 4. Hypoplastic left vertebral artery with a severe distal V4 stenosis. 5. Widely patent cervical carotid arteries. Electronically Signed   By: Logan Bores M.D.   On: 06/05/2020 15:50   CT HEAD WO CONTRAST  Result Date: 06/05/2020 CLINICAL DATA:  Right-sided weakness for 2 days.  Fall this morning. EXAM: CT HEAD WITHOUT CONTRAST TECHNIQUE: Contiguous axial images were obtained from the base of the skull through the vertex without intravenous contrast. COMPARISON:  Limited correlation made with PET-CT 05/08/2018. FINDINGS: Brain: There is no evidence of acute intracranial hemorrhage, mass lesion, brain edema or extra-axial fluid collection. The ventricles and subarachnoid spaces are appropriately sized for age. There is no CT evidence of acute cortical infarction. There is patchy and confluent low-density in the periventricular white matter bilaterally, most consistent with chronic small vessel ischemic changes. There is mild involvement of the right basal ganglia and left thalamus. Vascular: Intracranial vascular calcifications. No hyperdense vessel identified. Skull: Negative for fracture or focal lesion. Sinuses/Orbits: The visualized paranasal sinuses and mastoid air cells are clear. No orbital abnormalities are seen. Other: None. IMPRESSION: 1. No acute intracranial findings. 2. Chronic small vessel ischemic changes in the periventricular white matter and basal ganglia. Electronically Signed   By: Richardean Sale M.D.   On: 06/05/2020 11:38   CT Angio Neck W and/or Wo Contrast  Result Date: 06/05/2020 CLINICAL DATA:  Right-sided weakness for 2 days with a fall today. Facial droop and slurred speech. EXAM: CT ANGIOGRAPHY HEAD AND NECK TECHNIQUE: Multidetector CT  imaging of the head and neck was performed using the standard protocol during bolus administration of intravenous contrast. Multiplanar CT image reconstructions and MIPs were obtained to evaluate the vascular anatomy. Carotid stenosis measurements (when applicable) are obtained utilizing NASCET criteria, using the distal internal carotid diameter as the denominator. CONTRAST:  25mL OMNIPAQUE IOHEXOL 350 MG/ML SOLN COMPARISON:  None. FINDINGS: CTA NECK FINDINGS Aortic arch: Standard 3 vessel aortic arch with widely patent arch vessel origins. Right carotid system: Patent with mild calcified and soft plaque at the carotid bifurcation. No evidence of significant stenosis or dissection. Left carotid system: Patent with mild calcified and soft plaque at the carotid bifurcation. No evidence of significant stenosis or dissection. Vertebral arteries: The vertebral arteries are patent with the right being strongly dominant. There is the suggestion of  a moderate proximal right V1 stenosis although shoulder artifact may also be contributing to this appearance. Skeleton: Cervical spondylosis most notable at C6-7 where asymmetric left uncovertebral spurring results in moderate left neural foraminal stenosis. Other neck: No evidence of cervical lymphadenopathy or mass. Upper chest: Clear lung apices. Review of the MIP images confirms the above findings CTA HEAD FINDINGS Anterior circulation: The internal carotid arteries are patent from skull base to carotid termini with mild atherosclerotic plaque bilaterally not resulting in significant stenosis. ACAs and MCAs are patent without evidence of a proximal branch occlusion or significant A1 or M1 stenosis. There is atherosclerotic irregularity of the branch vessels as well as a focal severe proximal left M2 branch stenosis. There is a median artery of the corpus callosum with a severe stenosis distally. No aneurysm is identified. Posterior circulation: The intracranial vertebral  arteries are patent to the basilar with mild atherosclerotic narrowing on the right. The left V4 segment is diffusely small with multiple superimposed stenoses including a severe stenosis distally. Patent AICA and SCA origins are visualized bilaterally. The basilar artery is widely patent. There is a fetal origin of the right PCA. Both PCAs demonstrate mild diffuse atherosclerotic irregularity without evidence of a flow limiting proximal stenosis. No aneurysm is identified. Venous sinuses: Patent. Anatomic variants: Strongly dominant right vertebral artery. Median artery of the corpus callosum. Review of the MIP images confirms the above findings IMPRESSION: 1. No large vessel occlusion. 2. Intracranial atherosclerosis including severe stenoses of a left M2 branch vessel and of the median artery of the corpus callosum. 3. Strongly dominant right vertebral artery with possible moderate proximal V1 stenosis. 4. Hypoplastic left vertebral artery with a severe distal V4 stenosis. 5. Widely patent cervical carotid arteries. Electronically Signed   By: Logan Bores M.D.   On: 06/05/2020 15:50   MR BRAIN WO CONTRAST  Result Date: 06/05/2020 CLINICAL DATA:  Right-sided weakness for 2 days. EXAM: MRI HEAD WITHOUT CONTRAST TECHNIQUE: Multiplanar, multiecho pulse sequences of the brain and surrounding structures were obtained without intravenous contrast. COMPARISON:  Head CT and CTA 06/05/2020 FINDINGS: Brain: There are patchy acute infarcts in the left paramedian pons, and there is an 8 mm acute right frontal white matter infarct inferior to the frontal horn. Chronic microhemorrhages in the right greater than left thalami are likely secondary to chronic hypertension. There is also a linear region of chronic hemorrhage in the right external capsule. Patchy to confluent T2 hyperintensities in the cerebral white matter bilaterally are nonspecific but compatible with severely age advanced chronic small vessel ischemic  disease. There are chronic lacunar infarcts in the thalami, right basal ganglia, and bilateral cerebral white matter. There is mildly age advanced cerebral atrophy. No mass, midline shift, or extra-axial fluid collection is identified. Vascular: Abnormal appearance of the distal left vertebral artery with severe atherosclerotic stenosis shown on earlier CTA. Other major intracranial vascular flow voids are preserved. Skull and upper cervical spine: Unremarkable bone marrow signal. Sinuses/Orbits: Right cataract extraction. Paranasal sinuses and mastoid air cells are clear. Other: None. IMPRESSION: 1. Acute left pontine and right frontal white matter infarcts. 2. Severe chronic small vessel ischemic disease. Electronically Signed   By: Logan Bores M.D.   On: 06/05/2020 17:53   Assessment/Plan: Diagnosis: R pontine and L frontal infarcts 1. Does the need for close, 24 hr/day medical supervision in concert with the patient's rehab needs make it unreasonable for this patient to be served in a less intensive setting? Yes 2. Co-Morbidities requiring supervision/potential  complications: obesity (BMI 40.84), BPH, HTN, DM2, dyslipidemia, non-Hodgkin's lymphoma s/p chemotherapy 3. Due to bladder management, bowel management, safety, skin/wound care, disease management, medication administration, pain management and patient education, does the patient require 24 hr/day rehab nursing? Yes 4. Does the patient require coordinated care of a physician, rehab nurse, therapy disciplines of PT, OT, SLP to address physical and functional deficits in the context of the above medical diagnosis(es)? Yes Addressing deficits in the following areas: balance, endurance, locomotion, strength, transferring, bowel/bladder control, bathing, dressing, feeding, grooming, toileting, speech and psychosocial support 5. Can the patient actively participate in an intensive therapy program of at least 3 hrs of therapy per day at least 5 days  per week? Yes 6. The potential for patient to make measurable gains while on inpatient rehab is excellent 7. Anticipated functional outcomes upon discharge from inpatient rehab are min assist  with PT, modified independent with OT, modified independent with SLP. 8. Estimated rehab length of stay to reach the above functional goals is: 12-16 days 9. Anticipated discharge destination: Home 10. Overall Rehab/Functional Prognosis: excellent  RECOMMENDATIONS: This patient's condition is appropriate for continued rehabilitative care in the following setting: CIR Patient has agreed to participate in recommended program. Yes Note that insurance prior authorization may be required for reimbursement for recommended care.  Comment: Thank you for this consult. Admission coordinator to follow.  It would be beneficial for him to get a swallow study.  Leeroy Cha, MD

## 2020-06-06 NOTE — Plan of Care (Signed)
  Problem: Education: Goal: Knowledge of disease or condition will improve Outcome: Progressing Goal: Knowledge of secondary prevention will improve Outcome: Progressing Goal: Knowledge of patient specific risk factors addressed and post discharge goals established will improve Outcome: Progressing   Problem: Coping: Goal: Will verbalize positive feelings about self Outcome: Progressing Goal: Will identify appropriate support needs Outcome: Progressing   Problem: Health Behavior/Discharge Planning: Goal: Ability to manage health-related needs will improve Outcome: Progressing   Problem: Self-Care: Goal: Ability to participate in self-care as condition permits will improve Outcome: Progressing Goal: Verbalization of feelings and concerns over difficulty with self-care will improve Outcome: Progressing Goal: Ability to communicate needs accurately will improve Outcome: Progressing   Problem: Nutrition: Goal: Risk of aspiration will decrease Outcome: Progressing   Problem: Ischemic Stroke/TIA Tissue Perfusion: Goal: Complications of ischemic stroke/TIA will be minimized Outcome: Progressing

## 2020-06-06 NOTE — Progress Notes (Signed)
OT Cancellation Note  Patient Details Name: Peter Ryan MRN: [id number] DOB: 17-Oct-1950   Cancelled Treatment:    Reason Eval/Treat Not Completed: Fatigue/lethargy limiting ability to participate  Upon OT presenting to pt room, pt is finishing up with physical therapy. Pt is fatigued and politely requests rest break at this time. Will f/u as able for OT evaluation. Thank you.  Gerrianne Scale, [place], OTR/L ascom [phone] 06/06/20, 9:07 AM

## 2020-06-06 NOTE — Evaluation (Signed)
Occupational Therapy Evaluation Patient Details Name: Peter Ryan MRN: [id number] DOB: Dec 01, 1950 Today's Date: 06/06/2020    History of Present Illness HPI: Peter Ryan is a 69 y.o. male with medical history significant for obesity, BPH, hypertension, diabetes mellitus, dyslipidemia and non-Hodgkin's lymphoma status post chemotherapy currently under surveillance who presents to the emergency room for evaluation of right-sided weakness which started 1 day prior to his presentation but was more pronounced on the day of admission when he got up to use the bathroom and fell on his bottom. He was unable to get up and his wife was unable to assist him up so she called EMS. EMS was able to assist patient back to bed.  He has not been able to ambulate since his fall this morning. His wife also states that his speech was slurred. Imaging reveals R pontine and L frontal infarcts.    Clinical Impression   Pt was seen for OT evaluation this date. Prior to hospital admission, pt reports being Indep with self care ADLs/ADL mobility and was driving up until cataract surgery in July. [facility] with [facility] STE. Currently pt demonstrates impairments as described below (See OT problem list) which functionally limit his ability to perform ADL/self-care tasks. Pt currently requires MOD A with HOB elevated for sup to sit and MOD +2 with arm in arm technique for SPS from EOB to chair. In addition, pt requiring at least MAX A with LB dsg and MIN/MOD A with UB ADLs d/t decreased coordination/proprioceptoion with R UE/R LE. Pt would benefit from skilled OT to address noted impairments and functional limitations (see below for any additional details) in order to maximize safety and independence while minimizing falls risk and caregiver burden. Upon hospital discharge, recommend CIR to maximize pt safety and return to PLOF.     Follow Up Recommendations  CIR    Equipment Recommendations  Other (comment) (defer to next  venue of care)    Recommendations for Other Services Rehab consult     Precautions / Restrictions Precautions Precautions: Fall Restrictions Weight Bearing Restrictions: No      Mobility Bed Mobility Overal bed mobility: Needs Assistance Bed Mobility: Sidelying to Sit   Sidelying to sit: Mod assist;HOB elevated       General bed mobility comments: use of bed rails, increased time, assist for advancing LEs and trunk  Transfers Overall transfer level: Needs assistance   Transfers: Stand Pivot Transfers;Sit to/from Stand Sit to Stand: Mod assist;Max assist;From elevated surface Stand pivot transfers: Mod assist;+2 physical assistance       General transfer comment: pt demos decreased motor planning with R LE to contribute to advancing with transfers.    Balance Overall balance assessment: Needs assistance Sitting-balance support: Bilateral upper extremity supported Sitting balance-Leahy Scale: Fair Sitting balance - Comments: requires UE support and demos lean with some awareness and righting with intermittent MIN A (initailly MIN A continuously, but progresses with extended time sitting) Postural control: Posterior lean;Right lateral lean   Standing balance-Leahy Scale: Poor Standing balance comment: Pt requires UE support and MOD/MAX A to sustain static stand                           ADL either performed or assessed with clinical judgement   ADL  General ADL Comments: MAX A with LB ADLs in sitting, MIN/MOD A with UB ADLs in sitting. MOD +2 for SPS arm in arm t/f's     Vision Baseline Vision/History: Wears glasses Wears Glasses: At all times Patient Visual Report: No change from baseline (reports recent catact surgery in July) Vision Assessment?: Yes Eye Alignment: Within Functional Limits Ocular Range of Motion: Within Functional Limits Alignment/Gaze Preference: Within Defined  Limits Tracking/Visual Pursuits: Able to track stimulus in all quads without difficulty Convergence: Within functional limits     Perception     Praxis      Pertinent Vitals/Pain Pain Assessment: No/denies pain     Hand Dominance Right   Extremity/Trunk Assessment Upper Extremity Assessment Upper Extremity Assessment: RUE deficits/detail;LUE deficits/detail RUE Deficits / Details: shld flexion to 1/2 range, grip 4/5. Presence of dysdiadochokinesia RUE Sensation: decreased proprioception RUE Coordination: decreased fine motor;decreased gross motor LUE Deficits / Details: WFL, grip 5/5   Lower Extremity Assessment Lower Extremity Assessment: Defer to PT evaluation;RLE deficits/detail RLE Deficits / Details: grossly weak with fxl assessment       Communication Communication Communication: Other (comment) (acute slurring of words, mostly able to be understood/make needs known/inunciate)   Cognition Arousal/Alertness: Awake/alert Behavior During Therapy: WFL for tasks assessed/performed Overall Cognitive Status: Within Functional Limits for tasks assessed                                     General Comments       Exercises Other Exercises Other Exercises: OT facilitates ed re: role of OT, task modification considerations, fall prevention-use of call light, chair alarm. Pt with good understanding, but will require continued ed/trials/training for task mods.   Shoulder Instructions      Home Living Family/patient expects to be discharged to:: Inpatient rehab Living Arrangements: Spouse/significant other Available Help at Discharge: Family Type of Home: House Home Access: Stairs to enter [employer] of Steps: 4 Entrance Stairs-Rails: Right Home Layout: One level               Home Equipment: [place] - 4 wheels;Kasandra Knudsen - single point   Additional Comments: reports he has not been driving since cataract surgery in July.  Lives With:  Spouse    Prior Functioning/Environment Level of Independence: Independent with assistive device(s)        Comments: Pt had been able to do moderate community ambulation, has been seeing outpt PT for balance, strength, LBP        OT Problem List: Decreased strength;Decreased range of motion;Decreased activity tolerance;Impaired balance (sitting and/or standing);Decreased coordination;Decreased knowledge of use of DME or AE;Decreased knowledge of precautions;Impaired UE functional use      OT Treatment/Interventions: Self-care/ADL training;DME and/or AE instruction;Therapeutic activities;Balance training;Therapeutic exercise;Energy conservation;Patient/family education    OT Goals(Current goals can be found in the care plan section) Acute Rehab OT Goals Patient Stated Goal: get back to walking and being able to do for himself OT Goal Formulation: With patient Time For Goal Achievement: 06/20/20 Potential to Achieve Goals: Good ADL Goals Pt Will Perform Upper Body Dressing: with min assist (with modified hemi dressing technique) Pt Will Perform Lower Body Dressing: with min assist;with adaptive equipment;sit to/from stand Pt Will Transfer to Toilet: with min assist;stand pivot transfer;bedside commode Pt Will Perform Toileting - Clothing Manipulation and hygiene: with min assist;sit to/from stand  OT Frequency: Min 2X/week   Barriers to D/C:  Co-evaluation              AM-PAC OT "6 Clicks" Daily Activity     Outcome Measure Help from another person eating meals?: A Little Help from another person taking care of personal grooming?: A Little Help from another person toileting, which includes using toliet, bedpan, or urinal?: A Lot Help from another person bathing (including washing, rinsing, drying)?: A Lot Help from another person to put on and taking off regular upper body clothing?: A Little Help from another person to put on and taking off regular lower body  clothing?: A Lot 6 Click Score: 15   End of Session Equipment Utilized During Treatment: Gait belt Nurse Communication: Mobility status  Activity Tolerance: Patient tolerated treatment well Patient left: in chair;with call bell/phone within reach;with chair alarm set  OT Visit Diagnosis: Unsteadiness on feet (R26.81);Muscle weakness (generalized) (M62.81);Other symptoms and signs involving the nervous system (R29.898);Hemiplegia and hemiparesis Hemiplegia - Right/Left: Right Hemiplegia - dominant/non-dominant: Dominant Hemiplegia - caused by: Cerebral infarction                Time: 1032-1101 OT Time Calculation (min): 29 min Charges:  OT General Charges $OT Visit: 1 Visit OT Evaluation $OT Eval Moderate Complexity: 1 Mod OT Treatments $Therapeutic Activity: 8-22 mins  Gerrianne Scale, MS, OTR/L ascom [phone] 06/06/20, 3:01 PM

## 2020-06-06 NOTE — Evaluation (Signed)
Physical Therapy Evaluation Patient Details Name: Peter Ryan MRN: [id number] DOB: [birth year] Today's Date: 06/06/2020   History of Present Illness  HPI: Peter Ryan is a 69 y.o. male with medical history significant for obesity, BPH, hypertension, diabetes mellitus, dyslipidemia and non-Hodgkin's lymphoma status post chemotherapy currently under surveillance who presents to the emergency room for evaluation of right-sided weakness which started 1 day prior to his presentation but was more pronounced on the day of admission when he got up to use the bathroom and fell on his bottom. He was unable to get up and his wife was unable to assist him up so she called EMS. EMS was able to assist patient back to bed.  He has not been able to ambulate since his fall this morning. His wife also states that his speech was slurred. Imaging reveals R pontine and L frontal infarcts.   Clinical Impression  Pt initially hesitant to do a lot with PT as he very much struggled with transfers (needing +3 assist) to CT/MRI yesterday.  However he is highly motivated to get better and with some reassurance he embraced that he has a lot of work ahead of him and was willing to try as much as he could.  Pt with some R sided weakness (grossly 3+/5 t/o U&LE) with decreased coordination and vague c/o some numbness.  Pt also with significantly slurred speech (wife reports worse now than on arrival to ED.  Pt showed good effort in getting to sitting EOB but due to poor balance and ability to make timely righting adjustments.  We did manage a few standing bouts, with the final one ~4 minutes.  He showed great effort with attempts at small side and marching steps, but struggled with R LE WBing needing increased assist to maintain standing.  Pt leaning back and to the R, needing at least min assist much of the time (leaning into PT's body) but did manage to maintain standing w/o direct assist for ~30 seconds at one point with heavy  cuing to insure appropriate use of walker/UE WBing.       Follow Up Recommendations CIR;Supervision/Assistance - 24 hour    Equipment Recommendations   (TBD, has rollator but will likely need RW )    Recommendations for Other Services       Precautions / Restrictions Precautions Precautions: Fall Restrictions Weight Bearing Restrictions: No      Mobility  Bed Mobility Overal bed mobility: Needs Assistance Bed Mobility: Sidelying to Sit;Supine to Sit   Sidelying to sit: Max assist Supine to sit: Max assist     General bed mobility comments: Pt made good effort with getting to and from EOB but did need heavy assist to get trunk to upright.  Poor balance with lean back and to the R, even with heavy assist to square to EOB and cuing for UE use he struggled to maintain static sitting  Transfers Overall transfer level: Needs assistance Equipment used: Rolling walker (2 wheeled) Transfers: Sit to/from Stand Sit to Stand: Mod assist;Max assist         General transfer comment: Pt showed excellent effort with attempts to get to standing.  First attempt w/o assist from low bed setting was a non-starter.  Heavy cuing for set up and sequencing/positioning with ~2" raised bed and with consistent mod assist to keep hips forward he was able to attain near standing but could not shift weight over the walker and actually attain standing w/o direct assist.  Adjusted walker down  for next attempt with increased ability to put weight though it once but but still needing considerable assist to keep weight forward during transition   Ambulation/Gait             General Gait Details: Pt was able to manage a few inconsistent step-in-place (R > L due to poor WBing tolerance on R) and side shuffle steps but no true ambulation/steps  Stairs            Wheelchair Mobility    Modified Rankin (Stroke Patients Only)       Balance Overall balance assessment: Needs  assistance Sitting-balance support: Bilateral upper extremity supported Sitting balance-Leahy Scale: Fair Sitting balance - Comments: Pt initially with heavy posterior and to a lesser degree R lean, heavy cuing and assist to get fully to EOB showed some improvement, still needing at least min assist t/o the effort       Standing balance comment: After excessive cuing during "prolonged" (3-4 minutes) standing pt was able to maintain static standing with walker and on CGA.  However much of the effort in standing he was leaning back and to the R and PT's body was supporting at least some weight to maintain upright.  Excellent effort and determination to follow cuing but pt certainly struggled to maintain standing on his own t/o the time in standing                             Pertinent Vitals/Pain Pain Assessment: No/denies pain    Home Living Family/patient expects to be discharged to:: Inpatient rehab Living Arrangements: Spouse/significant other Available Help at Discharge: Family   Home Access: Stairs to enter Entrance Stairs-Rails: Right Entrance Stairs-Number of Steps: 4 Home Layout: One level Home Equipment: [place] - 4 wheels;Cane - single point      Prior Function Level of Independence: Independent with assistive device(s)         Comments: Pt had been able to do moderate community ambulation, has been seeing outpt PT for balance, strength, LBP     Hand Dominance        Extremity/Trunk Assessment   Upper Extremity Assessment Upper Extremity Assessment: RUE deficits/detail RUE Deficits / Details: Pt with reasonable pure strength, but with decreased coordination, quality of motion and proprioceptive awareness    Lower Extremity Assessment Lower Extremity Assessment: RLE deficits/detail RLE Deficits / Details: Pt again with good pure strength but with decreased control, quality of motion, coordination       Communication   Communication: No  difficulties (acutely slurred speech)  Cognition Arousal/Alertness: Awake/alert Behavior During Therapy: WFL for tasks assessed/performed Overall Cognitive Status: Within Functional Limits for tasks assessed                                        General Comments General comments (skin integrity, edema, etc.): Pt showed relatively good gross strength on effected side, however struggled to translate this during functional tasks, balance (tending back and to the R) were significant functional limiters     Exercises     Assessment/Plan    PT Assessment Patient needs continued PT services  PT Problem List Decreased strength;Decreased activity tolerance;Decreased range of motion;Decreased balance;Decreased mobility;Decreased coordination;Decreased knowledge of use of DME;Decreased safety awareness;Impaired sensation       PT Treatment Interventions DME instruction;Gait training;Functional mobility training;Therapeutic activities;Therapeutic exercise;Balance training;Neuromuscular  re-education;Patient/family education    PT Goals (Current goals can be found in the Care Plan section)  Acute Rehab PT Goals Patient Stated Goal: get back to walking PT Goal Formulation: With patient/family Time For Goal Achievement: 06/20/20 Potential to Achieve Goals: Fair    Frequency 7X/week   Barriers to discharge        Co-evaluation               AM-PAC PT "6 Clicks" Mobility  Outcome Measure Help needed turning from your back to your side while in a flat bed without using bedrails?: A Lot Help needed moving from lying on your back to sitting on the side of a flat bed without using bedrails?: A Lot Help needed moving to and from a bed to a chair (including a wheelchair)?: A Lot Help needed standing up from a chair using your arms (e.g., wheelchair or bedside chair)?: A Lot Help needed to walk in hospital room?: Total Help needed climbing 3-5 steps with a railing? :  Total 6 Click Score: 10    End of Session   Activity Tolerance: Patient limited by fatigue;Patient tolerated treatment well Patient left: with bed alarm set;with call bell/phone within reach;with family/visitor present Nurse Communication: Mobility status (difficulty with swallowing reported) PT Visit Diagnosis: Muscle weakness (generalized) (M62.81);Difficulty in walking, not elsewhere classified (R26.2);Other symptoms and signs involving the nervous system (R29.898);Hemiplegia and hemiparesis Hemiplegia - Right/Left: Right Hemiplegia - dominant/non-dominant: Dominant Hemiplegia - caused by: Cerebral infarction    Time: 0805-0858 PT Time Calculation (min) (ACUTE ONLY): 53 min   Charges:   PT Evaluation $PT Eval Moderate Complexity: 1 Mod PT Treatments $Therapeutic Activity: 23-37 mins        Kreg Shropshire, DPT 06/06/2020, 10:25 AM

## 2020-06-06 NOTE — Progress Notes (Signed)
Progress Note    Peter Ryan  XQJ:[id number] DOB: 1951-06-10  DOA: 06/05/2020 PCP: [name]., MD      Brief Narrative:    Medical records reviewed and are as summarized below:  Peter Ryan is a 69 y.o. male with medical history significant for obesity, BPH, hypertension, diabetes mellitus, dyslipidemia and non-Hodgkin's lymphoma status post chemotherapy currently under surveillance.  He presented to the hospital because of right-sided weakness that started on the day prior to admission.  He still felt weak in his right leg the following day and he fell on the floor when he tried to get up to use the bathroom.  His wife also noticed slurred speech.  MRI brain showed acute left pontine and right frontal white matter infarcts.  He was on low-dose aspirin at home and neurologist has recommended aspirin be increased to 325 mg daily.      Assessment/Plan:   Principal Problem:   Acute CVA (cerebrovascular accident) ([facility]) Active Problems:   Type 2 diabetes mellitus ([place])   Benign essential hypertension   Obesity, Class III, BMI 40-49.9 (morbid obesity) ([place])   Acute left pontine and right frontal infarct/stroke S/p fall at home Type II DM Hypertension Morbid obesity Dyslipidemia Dysphagia Non-Hodgkin's lymphoma s/p chemotherapy  Body mass index is 40.84 kg/m.  (Morbid obesity)   PLAN  Continue aspirin and Lipitor PT and OT evaluation 2D echo is pending Amlodipine and losartan have been held Follow-up with neurologist for further recommendations Plan discussed with patient and his wife at the bedside   Diet Order            Diet heart healthy/carb modified Room service appropriate? Yes; Fluid consistency: Thin  Diet effective now                    Consultants:  Neurologist  Procedures:  None    Medications:   .  stroke: mapping our early stages of recovery book   Does not apply Once  . allopurinol  300 mg Oral Daily  .  aspirin  300 mg Rectal Daily   Or  . aspirin EC  325 mg Oral Daily  . atorvastatin  80 mg Oral Daily  . cholecalciferol  1,000 Units Oral Daily  . finasteride  5 mg Oral Daily  . [START ON 06/07/2020] hydrocortisone cream   Topical Q M,W,F  . insulin aspart  0-20 Units Subcutaneous Q4H  . sodium chloride flush  3 mL Intravenous Once   Continuous Infusions:   Anti-infectives (From admission, onward)   None             Family Communication/Anticipated D/C date and plan/Code Status   DVT prophylaxis: SCD's Start: 06/05/20 1430     Code Status: Full Code  Family Communication: Plan discussed with his wife Disposition Plan:    Status is: Inpatient  Remains inpatient appropriate because:Unsafe d/c plan and Inpatient level of care appropriate due to severity of illness   Dispo: The patient is from: Home              Anticipated d/c is to: [facility]              Anticipated d/c date is: 2 days              Patient currently is not medically stable to d/c.           Subjective:   C/o right leg weakness, slurred speech and difficulty swallowing.  Objective:    Vitals:   06/05/20 2336 06/06/20 0442 06/06/20 0734 06/06/20 1139  BP: (!) 153/75 (!) 154/67 (!) 157/70 (!) 150/85  Pulse: 76 79 79 90  Resp: 17 18 20 18   Temp: 98.3 F (36.8 C) 97.9 F (36.6 C) 98.2 F (36.8 C) 98.7 F (37.1 C)  TempSrc: Oral Oral Oral Oral  SpO2: 98% 94% 98% 97%  Weight:      Height:       No data found.   Intake/Output Summary (Last 24 hours) at 06/06/2020 1422 Last data filed at 06/06/2020 1007 Gross per 24 hour  Intake 0 ml  Output --  Net 0 ml   Filed Weights   06/05/20 1051  Weight: 129.1 kg    Exam:  GEN: NAD SKIN: No rash EYES: EOMI ENT: MMM CV: RRR PULM: CTA B ABD: soft, obese and, NT, +BS CNS: AAO x 3, right facial droop, right leg weakness (power 3/5) EXT: No edema or tenderness   Data Reviewed:   I have personally reviewed following labs and  imaging studies:  Labs: Labs show the following:   Basic Metabolic Panel: Recent Labs  Lab 05/30/20 2358 06/05/20 1100  NA 137 140  K 3.3* 3.4*  CL 104 103  CO2 20* 22  GLUCOSE 307* 252*  BUN 21 19  CREATININE 0.87 0.81  CALCIUM 9.1 8.6*   GFR Estimated Creatinine Clearance: 119.4 mL/min (by C-G formula based on SCr of 0.81 mg/dL). Liver Function Tests: Recent Labs  Lab 05/30/20 2358 06/05/20 1100  AST 18 27  ALT 17 21  ALKPHOS 54 56  BILITOT 0.9 0.7  PROT 7.2 7.2  ALBUMIN 3.9 3.9   No results for input(s): LIPASE, AMYLASE in the last 168 hours. No results for input(s): AMMONIA in the last 168 hours. Coagulation profile Recent Labs  Lab 06/05/20 1100  INR 1.0    CBC: Recent Labs  Lab 05/30/20 2358 06/05/20 1100  WBC 10.3 11.4*  NEUTROABS  --  8.9*  HGB 14.2 11.9*  HCT 40.5 34.0*  MCV 85.6 85.2  PLT 233 254   Cardiac Enzymes: No results for input(s): CKTOTAL, CKMB, CKMBINDEX, TROPONINI in the last 168 hours. BNP (last 3 results) No results for input(s): PROBNP in the last 8760 hours. CBG: Recent Labs  Lab 06/05/20 2024 06/06/20 0053 06/06/20 0526 06/06/20 0737 06/06/20 1140  GLUCAP 166* 161* 149* 150* 180*   D-Dimer: No results for input(s): DDIMER in the last 72 hours. Hgb A1c: No results for input(s): HGBA1C in the last 72 hours. Lipid Profile: Recent Labs    06/06/20 0413  CHOL 170  HDL 29*  LDLCALC 77  TRIG 321*  CHOLHDL 5.9   Thyroid function studies: No results for input(s): TSH, T4TOTAL, T3FREE, THYROIDAB in the last 72 hours.  Invalid input(s): FREET3 Anemia work up: No results for input(s): VITAMINB12, FOLATE, FERRITIN, TIBC, IRON, RETICCTPCT in the last 72 hours. Sepsis Labs: Recent Labs  Lab 05/30/20 2358 06/05/20 1100  WBC 10.3 11.4*    Microbiology Recent Results (from the past 240 hour(s))  SARS Coronavirus 2 by RT PCR (hospital order, performed in [facility] hospital lab) Nasopharyngeal Nasopharyngeal  Swab     Status: None   Collection Time: 06/05/20  2:39 PM   Specimen: Nasopharyngeal Swab  Result Value Ref Range Status   SARS Coronavirus 2 NEGATIVE NEGATIVE Final    Comment: (NOTE) SARS-CoV-2 target nucleic acids are NOT DETECTED.  The SARS-CoV-2 RNA is generally detectable in upper  and lower respiratory specimens during the acute phase of infection. The lowest concentration of SARS-CoV-2 viral copies this assay can detect is 250 copies / mL. A negative result does not preclude SARS-CoV-2 infection and should not be used as the sole basis for treatment or other patient management decisions.  A negative result may occur with improper specimen collection / handling, submission of specimen other than nasopharyngeal swab, presence of viral mutation(s) within the areas targeted by this assay, and inadequate number of viral copies (<250 copies / mL). A negative result must be combined with clinical observations, patient history, and epidemiological information.  Fact Sheet for Patients:   StrictlyIdeas.no  Fact Sheet for Healthcare Providers: BankingDealers.co.za  This test is not yet approved or  cleared by the [place] FDA and has been authorized for detection and/or diagnosis of SARS-CoV-2 by FDA under an Emergency Use Authorization (EUA).  This EUA will remain in effect (meaning this test can be used) for the duration of the COVID-19 declaration under Section 564(b)(1) of the Act, 21 U.S.C. section 360bbb-3(b)(1), unless the authorization is terminated or revoked sooner.  Performed at [facility], [address] [place], [place] [address]     Procedures and diagnostic studies:  CT Angio Head W or Wo Contrast  Result Date: 06/05/2020 CLINICAL DATA:  Right-sided weakness for 2 days with a fall today. Facial droop and slurred speech. EXAM: CT ANGIOGRAPHY HEAD AND NECK TECHNIQUE: Multidetector CT imaging of the  head and neck was performed using the standard protocol during bolus administration of intravenous contrast. Multiplanar CT image reconstructions and MIPs were obtained to evaluate the vascular anatomy. Carotid stenosis measurements (when applicable) are obtained utilizing NASCET criteria, using the distal internal carotid diameter as the denominator. CONTRAST:  32mL OMNIPAQUE IOHEXOL 350 MG/ML SOLN COMPARISON:  None. FINDINGS: CTA NECK FINDINGS Aortic arch: Standard 3 vessel aortic arch with widely patent arch vessel origins. Right carotid system: Patent with mild calcified and soft plaque at the carotid bifurcation. No evidence of significant stenosis or dissection. Left carotid system: Patent with mild calcified and soft plaque at the carotid bifurcation. No evidence of significant stenosis or dissection. Vertebral arteries: The vertebral arteries are patent with the right being strongly dominant. There is the suggestion of a moderate proximal right V1 stenosis although shoulder artifact may also be contributing to this appearance. Skeleton: Cervical spondylosis most notable at C6-7 where asymmetric left uncovertebral spurring results in moderate left neural foraminal stenosis. Other neck: No evidence of cervical lymphadenopathy or mass. Upper chest: Clear lung apices. Review of the MIP images confirms the above findings CTA HEAD FINDINGS Anterior circulation: The internal carotid arteries are patent from skull base to carotid termini with mild atherosclerotic plaque bilaterally not resulting in significant stenosis. ACAs and MCAs are patent without evidence of a proximal branch occlusion or significant A1 or M1 stenosis. There is atherosclerotic irregularity of the branch vessels as well as a focal severe proximal left M2 branch stenosis. There is a median artery of the corpus callosum with a severe stenosis distally. No aneurysm is identified. Posterior circulation: The intracranial vertebral arteries are  patent to the basilar with mild atherosclerotic narrowing on the right. The left V4 segment is diffusely small with multiple superimposed stenoses including a severe stenosis distally. Patent AICA and SCA origins are visualized bilaterally. The basilar artery is widely patent. There is a fetal origin of the right PCA. Both PCAs demonstrate mild diffuse atherosclerotic irregularity without evidence of a flow limiting proximal  stenosis. No aneurysm is identified. Venous sinuses: Patent. Anatomic variants: Strongly dominant right vertebral artery. Median artery of the corpus callosum. Review of the MIP images confirms the above findings IMPRESSION: 1. No large vessel occlusion. 2. Intracranial atherosclerosis including severe stenoses of a left M2 branch vessel and of the median artery of the corpus callosum. 3. Strongly dominant right vertebral artery with possible moderate proximal V1 stenosis. 4. Hypoplastic left vertebral artery with a severe distal V4 stenosis. 5. Widely patent cervical carotid arteries. Electronically Signed   By: Logan Bores M.D.   On: 06/05/2020 15:50   CT HEAD WO CONTRAST  Result Date: 06/05/2020 CLINICAL DATA:  Right-sided weakness for 2 days.  Fall this morning. EXAM: CT HEAD WITHOUT CONTRAST TECHNIQUE: Contiguous axial images were obtained from the base of the skull through the vertex without intravenous contrast. COMPARISON:  Limited correlation made with PET-CT 05/08/2018. FINDINGS: Brain: There is no evidence of acute intracranial hemorrhage, mass lesion, brain edema or extra-axial fluid collection. The ventricles and subarachnoid spaces are appropriately sized for age. There is no CT evidence of acute cortical infarction. There is patchy and confluent low-density in the periventricular white matter bilaterally, most consistent with chronic small vessel ischemic changes. There is mild involvement of the right basal ganglia and left thalamus. Vascular: Intracranial vascular  calcifications. No hyperdense vessel identified. Skull: Negative for fracture or focal lesion. Sinuses/Orbits: The visualized paranasal sinuses and mastoid air cells are clear. No orbital abnormalities are seen. Other: None. IMPRESSION: 1. No acute intracranial findings. 2. Chronic small vessel ischemic changes in the periventricular white matter and basal ganglia. Electronically Signed   By: Richardean Sale M.D.   On: 06/05/2020 11:38   CT Angio Neck W and/or Wo Contrast  Result Date: 06/05/2020 CLINICAL DATA:  Right-sided weakness for 2 days with a fall today. Facial droop and slurred speech. EXAM: CT ANGIOGRAPHY HEAD AND NECK TECHNIQUE: Multidetector CT imaging of the head and neck was performed using the standard protocol during bolus administration of intravenous contrast. Multiplanar CT image reconstructions and MIPs were obtained to evaluate the vascular anatomy. Carotid stenosis measurements (when applicable) are obtained utilizing NASCET criteria, using the distal internal carotid diameter as the denominator. CONTRAST:  18mL OMNIPAQUE IOHEXOL 350 MG/ML SOLN COMPARISON:  None. FINDINGS: CTA NECK FINDINGS Aortic arch: Standard 3 vessel aortic arch with widely patent arch vessel origins. Right carotid system: Patent with mild calcified and soft plaque at the carotid bifurcation. No evidence of significant stenosis or dissection. Left carotid system: Patent with mild calcified and soft plaque at the carotid bifurcation. No evidence of significant stenosis or dissection. Vertebral arteries: The vertebral arteries are patent with the right being strongly dominant. There is the suggestion of a moderate proximal right V1 stenosis although shoulder artifact may also be contributing to this appearance. Skeleton: Cervical spondylosis most notable at C6-7 where asymmetric left uncovertebral spurring results in moderate left neural foraminal stenosis. Other neck: No evidence of cervical lymphadenopathy or mass. Upper  chest: Clear lung apices. Review of the MIP images confirms the above findings CTA HEAD FINDINGS Anterior circulation: The internal carotid arteries are patent from skull base to carotid termini with mild atherosclerotic plaque bilaterally not resulting in significant stenosis. ACAs and MCAs are patent without evidence of a proximal branch occlusion or significant A1 or M1 stenosis. There is atherosclerotic irregularity of the branch vessels as well as a focal severe proximal left M2 branch stenosis. There is a median artery of the corpus callosum with  a severe stenosis distally. No aneurysm is identified. Posterior circulation: The intracranial vertebral arteries are patent to the basilar with mild atherosclerotic narrowing on the right. The left V4 segment is diffusely small with multiple superimposed stenoses including a severe stenosis distally. Patent AICA and SCA origins are visualized bilaterally. The basilar artery is widely patent. There is a fetal origin of the right PCA. Both PCAs demonstrate mild diffuse atherosclerotic irregularity without evidence of a flow limiting proximal stenosis. No aneurysm is identified. Venous sinuses: Patent. Anatomic variants: Strongly dominant right vertebral artery. Median artery of the corpus callosum. Review of the MIP images confirms the above findings IMPRESSION: 1. No large vessel occlusion. 2. Intracranial atherosclerosis including severe stenoses of a left M2 branch vessel and of the median artery of the corpus callosum. 3. Strongly dominant right vertebral artery with possible moderate proximal V1 stenosis. 4. Hypoplastic left vertebral artery with a severe distal V4 stenosis. 5. Widely patent cervical carotid arteries. Electronically Signed   By: Logan Bores M.D.   On: 06/05/2020 15:50   MR BRAIN WO CONTRAST  Result Date: 06/05/2020 CLINICAL DATA:  Right-sided weakness for 2 days. EXAM: MRI HEAD WITHOUT CONTRAST TECHNIQUE: Multiplanar, multiecho pulse sequences  of the brain and surrounding structures were obtained without intravenous contrast. COMPARISON:  Head CT and CTA 06/05/2020 FINDINGS: Brain: There are patchy acute infarcts in the left paramedian pons, and there is an 8 mm acute right frontal white matter infarct inferior to the frontal horn. Chronic microhemorrhages in the right greater than left thalami are likely secondary to chronic hypertension. There is also a linear region of chronic hemorrhage in the right external capsule. Patchy to confluent T2 hyperintensities in the cerebral white matter bilaterally are nonspecific but compatible with severely age advanced chronic small vessel ischemic disease. There are chronic lacunar infarcts in the thalami, right basal ganglia, and bilateral cerebral white matter. There is mildly age advanced cerebral atrophy. No mass, midline shift, or extra-axial fluid collection is identified. Vascular: Abnormal appearance of the distal left vertebral artery with severe atherosclerotic stenosis shown on earlier CTA. Other major intracranial vascular flow voids are preserved. Skull and upper cervical spine: Unremarkable bone marrow signal. Sinuses/Orbits: Right cataract extraction. Paranasal sinuses and mastoid air cells are clear. Other: None. IMPRESSION: 1. Acute left pontine and right frontal white matter infarcts. 2. Severe chronic small vessel ischemic disease. Electronically Signed   By: Logan Bores M.D.   On: 06/05/2020 17:53               LOS: 1 day   Abbigale Mcelhaney  [facility]   Pager on [email or web address]. If 7PM-7AM, please contact night-coverage at [email or web address]     06/06/2020, 2:22 PM

## 2020-06-06 NOTE — Progress Notes (Addendum)
Subjective: Feels that his RUE is slightly weaker than yesterday. MRI brain has been completed, revealing a left pontine acute ischemic infarction.   Objective: Current vital signs: BP (!) 150/85 (BP Location: Left Arm)   Pulse 90   Temp 98.7 F (37.1 C) (Oral)   Resp 18   Ht 5\' 10"  (1.778 m)   Wt 129.1 kg   SpO2 97%   BMI 40.84 kg/m  Vital signs in last 24 hours: Temp:  [97.9 F (36.6 C)-98.7 F (37.1 C)] 98.7 F (37.1 C) (09/07 1139) Pulse Rate:  [76-90] 90 (09/07 1139) Resp:  [13-20] 18 (09/07 1139) BP: (145-157)/(66-85) 150/85 (09/07 1139) SpO2:  [94 %-99 %] 97 % (09/07 1139)  Intake/Output from previous day: No intake/output data recorded. Intake/Output this shift: No intake/output data recorded. Nutritional status:  Diet Order            Diet heart healthy/carb modified Room service appropriate? Yes; Fluid consistency: Thin  Diet effective now                HEENT: [place]/AT Lungs: Respirations unlabored Ext: No edema  Neurologic Exam: Ment: Alert and oriented. Speech fluent with intact comprehension. Dysarthria is noted.  CN: Fixates normally on visual stimuli. Pupils equal. EOMI with saccadic pursuits noted. Right facial droop.  Motor: RUE and RLE weakness noted relative to the left. RUE drift when held antigravity.  Cerebellar: FNF mildly ataxic on the right.   Lab Results: Results for orders placed or performed during the hospital encounter of 06/05/20 (from the past 48 hour(s))  Protime-INR     Status: None   Collection Time: 06/05/20 11:00 AM  Result Value Ref Range   Prothrombin Time 12.9 11.4 - 15.2 seconds   INR 1.0 0.8 - 1.2    Comment: (NOTE) INR goal varies based on device and disease states. Performed at [facility], [address] [place], [place] [address]   APTT     Status: None   Collection Time: 06/05/20 11:00 AM  Result Value Ref Range   aPTT 29 24 - 36 seconds    Comment: Performed at [facility], [address] [place], [place] [address]  CBC     Status: Abnormal   Collection Time: 06/05/20 11:00 AM  Result Value Ref Range   WBC 11.4 (H) 4.0 - 10.5 K/uL   RBC 3.99 (L) 4.22 - 5.81 MIL/uL   Hemoglobin 11.9 (L) 13.0 - 17.0 g/dL   HCT 34.0 (L) 39 - 52 %   MCV 85.2 80.0 - 100.0 fL   MCH 29.8 26.0 - 34.0 pg   MCHC 35.0 30.0 - 36.0 g/dL   RDW 13.8 11.5 - 15.5 %   Platelets 254 150 - 400 K/uL   nRBC 0.0 0.0 - 0.2 %    Comment: Performed at [facility], [address] [place], [place] [address]  Differential     Status: Abnormal   Collection Time: 06/05/20 11:00 AM  Result Value Ref Range   Neutrophils Relative % 77 %   Neutro Abs 8.9 (H) 1.7 - 7.7 K/uL   Lymphocytes Relative 13 %   Lymphs Abs 1.4 0.7 - 4.0 K/uL   Monocytes Relative 7 %   Monocytes Absolute 0.8 0 - 1 K/uL   Eosinophils Relative 1 %   Eosinophils Absolute 0.1 0 - 0 K/uL   Basophils Relative 1 %   Basophils Absolute 0.1 0 - 0 K/uL   Immature Granulocytes 1 %  Abs Immature Granulocytes 0.06 0.00 - 0.07 K/uL    Comment: Performed at [facility], [address] [place], [place] [address]  Comprehensive metabolic panel     Status: Abnormal   Collection Time: 06/05/20 11:00 AM  Result Value Ref Range   Sodium 140 135 - 145 mmol/L   Potassium 3.4 (L) 3.5 - 5.1 mmol/L   Chloride 103 98 - 111 mmol/L   CO2 22 22 - 32 mmol/L   Glucose, Bld 252 (H) 70 - 99 mg/dL    Comment: Glucose reference range applies only to samples taken after fasting for at least 8 hours.   BUN 19 8 - 23 mg/dL   Creatinine, Ser 0.81 0.61 - 1.24 mg/dL   Calcium 8.6 (L) 8.9 - 10.3 mg/dL   Total Protein 7.2 6.5 - 8.1 g/dL   Albumin 3.9 3.5 - 5.0 g/dL   AST 27 15 - 41 U/L   ALT 21 0 - 44 U/L   Alkaline Phosphatase 56 38 - 126 U/L   Total Bilirubin 0.7 0.3 - 1.2 mg/dL   GFR calc non Af Amer >60 >60 mL/min   GFR calc Af Amer >60 >60 mL/min   Anion gap 15 5 - 15    Comment: Performed at [facility], [address],  [place], [place] [address]  SARS Coronavirus 2 by RT PCR (hospital order, performed in [facility] hospital lab) Nasopharyngeal Nasopharyngeal Swab     Status: None   Collection Time: 06/05/20  2:39 PM   Specimen: Nasopharyngeal Swab  Result Value Ref Range   SARS Coronavirus 2 NEGATIVE NEGATIVE    Comment: (NOTE) SARS-CoV-2 target nucleic acids are NOT DETECTED.  The SARS-CoV-2 RNA is generally detectable in upper and lower respiratory specimens during the acute phase of infection. The lowest concentration of SARS-CoV-2 viral copies this assay can detect is 250 copies / mL. A negative result does not preclude SARS-CoV-2 infection and should not be used as the sole basis for treatment or other patient management decisions.  A negative result may occur with improper specimen collection / handling, submission of specimen other than nasopharyngeal swab, presence of viral mutation(s) within the areas targeted by this assay, and inadequate number of viral copies (<250 copies / mL). A negative result must be combined with clinical observations, patient history, and epidemiological information.  Fact Sheet for Patients:   StrictlyIdeas.no  Fact Sheet for Healthcare Providers: BankingDealers.co.za  This test is not yet approved or  cleared by the [place] FDA and has been authorized for detection and/or diagnosis of SARS-CoV-2 by FDA under an Emergency Use Authorization (EUA).  This EUA will remain in effect (meaning this test can be used) for the duration of the COVID-19 declaration under Section 564(b)(1) of the Act, 21 U.S.C. section 360bbb-3(b)(1), unless the authorization is terminated or revoked sooner.  Performed at [facility], [address] [place], [place] [address]   Glucose, capillary     Status: Abnormal   Collection Time: 06/05/20  4:14 PM  Result Value Ref Range   Glucose-Capillary 166 (H) 70 - 99 mg/dL     Comment: Glucose reference range applies only to samples taken after fasting for at least 8 hours.  Glucose, capillary     Status: Abnormal   Collection Time: 06/05/20  8:24 PM  Result Value Ref Range   Glucose-Capillary 166 (H) 70 - 99 mg/dL    Comment: Glucose reference range applies only to samples taken after fasting for  at least 8 hours.  Glucose, capillary     Status: Abnormal   Collection Time: 06/06/20 12:53 AM  Result Value Ref Range   Glucose-Capillary 161 (H) 70 - 99 mg/dL    Comment: Glucose reference range applies only to samples taken after fasting for at least 8 hours.   Comment 1 Notify RN   HIV Antibody (routine testing w rflx)     Status: None   Collection Time: 06/06/20  4:13 AM  Result Value Ref Range   HIV Screen 4th Generation wRfx Non Reactive Non Reactive    Comment: Performed at [facility], [place] [address] [place], [place] [address]  Hemoglobin A1c     Status: Abnormal   Collection Time: 06/06/20  4:13 AM  Result Value Ref Range   Hgb A1c MFr Bld 7.5 (H) 4.8 - 5.6 %    Comment: (NOTE) Pre diabetes:          5.7%-6.4%  Diabetes:              >6.4%  Glycemic control for   <7.0% adults with diabetes    Mean Plasma Glucose 168.55 mg/dL    Comment: Performed at [facility] [address], [place], [place] [address]  Lipid panel     Status: Abnormal   Collection Time: 06/06/20  4:13 AM  Result Value Ref Range   Cholesterol 170 0 - 200 mg/dL   Triglycerides 321 (H) <150 mg/dL   HDL 29 (L) >40 mg/dL   Total CHOL/HDL Ratio 5.9 RATIO   VLDL 64 (H) 0 - 40 mg/dL   LDL Cholesterol 77 0 - 99 mg/dL    Comment:        Total Cholesterol/HDL:CHD Risk Coronary Heart Disease Risk Table                     Men   Women  1/2 Average Risk   3.4   3.3  Average Risk       5.0   4.4  2 X Average Risk   9.6   7.1  3 X Average Risk  23.4   11.0        Use the calculated Patient Ratio above and the CHD Risk Table to determine the patient's CHD Risk.         ATP III CLASSIFICATION (LDL):  <100     mg/dL   Optimal  100-129  mg/dL   Near or Above                    Optimal  130-159  mg/dL   Borderline  160-189  mg/dL   High  >190     mg/dL   Very High Performed at [facility], [address] [place], [place] [address]   Glucose, capillary     Status: Abnormal   Collection Time: 06/06/20  5:26 AM  Result Value Ref Range   Glucose-Capillary 149 (H) 70 - 99 mg/dL    Comment: Glucose reference range applies only to samples taken after fasting for at least 8 hours.  Glucose, capillary     Status: Abnormal   Collection Time: 06/06/20  7:37 AM  Result Value Ref Range   Glucose-Capillary 150 (H) 70 - 99 mg/dL    Comment: Glucose reference range applies only to samples taken after fasting for at least 8 hours.  Glucose, capillary     Status: Abnormal   Collection Time: 06/06/20 11:40  AM  Result Value Ref Range   Glucose-Capillary 180 (H) 70 - 99 mg/dL    Comment: Glucose reference range applies only to samples taken after fasting for at least 8 hours.    Recent Results (from the past 240 hour(s))  SARS Coronavirus 2 by RT PCR (hospital order, performed in [facility] hospital lab) Nasopharyngeal Nasopharyngeal Swab     Status: None   Collection Time: 06/05/20  2:39 PM   Specimen: Nasopharyngeal Swab  Result Value Ref Range Status   SARS Coronavirus 2 NEGATIVE NEGATIVE Final    Comment: (NOTE) SARS-CoV-2 target nucleic acids are NOT DETECTED.  The SARS-CoV-2 RNA is generally detectable in upper and lower respiratory specimens during the acute phase of infection. The lowest concentration of SARS-CoV-2 viral copies this assay can detect is 250 copies / mL. A negative result does not preclude SARS-CoV-2 infection and should not be used as the sole basis for treatment or other patient management decisions.  A negative result may occur with improper specimen collection / handling, submission of specimen other than nasopharyngeal  swab, presence of viral mutation(s) within the areas targeted by this assay, and inadequate number of viral copies (<250 copies / mL). A negative result must be combined with clinical observations, patient history, and epidemiological information.  Fact Sheet for Patients:   StrictlyIdeas.no  Fact Sheet for Healthcare Providers: BankingDealers.co.za  This test is not yet approved or  cleared by the [place] FDA and has been authorized for detection and/or diagnosis of SARS-CoV-2 by FDA under an Emergency Use Authorization (EUA).  This EUA will remain in effect (meaning this test can be used) for the duration of the COVID-19 declaration under Section 564(b)(1) of the Act, 21 U.S.C. section 360bbb-3(b)(1), unless the authorization is terminated or revoked sooner.  Performed at [facility], [address] [place], [place] [address]     Lipid Panel Recent Labs    06/06/20 0413  CHOL 170  TRIG 321*  HDL 29*  CHOLHDL 5.9  VLDL 64*  LDLCALC 77    Studies/Results: CT Angio Head W or Wo Contrast  Result Date: 06/05/2020 CLINICAL DATA:  Right-sided weakness for 2 days with a fall today. Facial droop and slurred speech. EXAM: CT ANGIOGRAPHY HEAD AND NECK TECHNIQUE: Multidetector CT imaging of the head and neck was performed using the standard protocol during bolus administration of intravenous contrast. Multiplanar CT image reconstructions and MIPs were obtained to evaluate the vascular anatomy. Carotid stenosis measurements (when applicable) are obtained utilizing NASCET criteria, using the distal internal carotid diameter as the denominator. CONTRAST:  33mL OMNIPAQUE IOHEXOL 350 MG/ML SOLN COMPARISON:  None. FINDINGS: CTA NECK FINDINGS Aortic arch: Standard 3 vessel aortic arch with widely patent arch vessel origins. Right carotid system: Patent with mild calcified and soft plaque at the carotid bifurcation. No evidence of  significant stenosis or dissection. Left carotid system: Patent with mild calcified and soft plaque at the carotid bifurcation. No evidence of significant stenosis or dissection. Vertebral arteries: The vertebral arteries are patent with the right being strongly dominant. There is the suggestion of a moderate proximal right V1 stenosis although shoulder artifact may also be contributing to this appearance. Skeleton: Cervical spondylosis most notable at C6-7 where asymmetric left uncovertebral spurring results in moderate left neural foraminal stenosis. Other neck: No evidence of cervical lymphadenopathy or mass. Upper chest: Clear lung apices. Review of the MIP images confirms the above findings CTA HEAD FINDINGS Anterior circulation: The internal carotid arteries are patent  from skull base to carotid termini with mild atherosclerotic plaque bilaterally not resulting in significant stenosis. ACAs and MCAs are patent without evidence of a proximal branch occlusion or significant A1 or M1 stenosis. There is atherosclerotic irregularity of the branch vessels as well as a focal severe proximal left M2 branch stenosis. There is a median artery of the corpus callosum with a severe stenosis distally. No aneurysm is identified. Posterior circulation: The intracranial vertebral arteries are patent to the basilar with mild atherosclerotic narrowing on the right. The left V4 segment is diffusely small with multiple superimposed stenoses including a severe stenosis distally. Patent AICA and SCA origins are visualized bilaterally. The basilar artery is widely patent. There is a fetal origin of the right PCA. Both PCAs demonstrate mild diffuse atherosclerotic irregularity without evidence of a flow limiting proximal stenosis. No aneurysm is identified. Venous sinuses: Patent. Anatomic variants: Strongly dominant right vertebral artery. Median artery of the corpus callosum. Review of the MIP images confirms the above findings  IMPRESSION: 1. No large vessel occlusion. 2. Intracranial atherosclerosis including severe stenoses of a left M2 branch vessel and of the median artery of the corpus callosum. 3. Strongly dominant right vertebral artery with possible moderate proximal V1 stenosis. 4. Hypoplastic left vertebral artery with a severe distal V4 stenosis. 5. Widely patent cervical carotid arteries. Electronically Signed   By: Logan Bores M.D.   On: 06/05/2020 15:50   CT HEAD WO CONTRAST  Result Date: 06/05/2020 CLINICAL DATA:  Right-sided weakness for 2 days.  Fall this morning. EXAM: CT HEAD WITHOUT CONTRAST TECHNIQUE: Contiguous axial images were obtained from the base of the skull through the vertex without intravenous contrast. COMPARISON:  Limited correlation made with PET-CT 05/08/2018. FINDINGS: Brain: There is no evidence of acute intracranial hemorrhage, mass lesion, brain edema or extra-axial fluid collection. The ventricles and subarachnoid spaces are appropriately sized for age. There is no CT evidence of acute cortical infarction. There is patchy and confluent low-density in the periventricular white matter bilaterally, most consistent with chronic small vessel ischemic changes. There is mild involvement of the right basal ganglia and left thalamus. Vascular: Intracranial vascular calcifications. No hyperdense vessel identified. Skull: Negative for fracture or focal lesion. Sinuses/Orbits: The visualized paranasal sinuses and mastoid air cells are clear. No orbital abnormalities are seen. Other: None. IMPRESSION: 1. No acute intracranial findings. 2. Chronic small vessel ischemic changes in the periventricular white matter and basal ganglia. Electronically Signed   By: Richardean Sale M.D.   On: 06/05/2020 11:38   CT Angio Neck W and/or Wo Contrast  Result Date: 06/05/2020 CLINICAL DATA:  Right-sided weakness for 2 days with a fall today. Facial droop and slurred speech. EXAM: CT ANGIOGRAPHY HEAD AND NECK TECHNIQUE:  Multidetector CT imaging of the head and neck was performed using the standard protocol during bolus administration of intravenous contrast. Multiplanar CT image reconstructions and MIPs were obtained to evaluate the vascular anatomy. Carotid stenosis measurements (when applicable) are obtained utilizing NASCET criteria, using the distal internal carotid diameter as the denominator. CONTRAST:  29mL OMNIPAQUE IOHEXOL 350 MG/ML SOLN COMPARISON:  None. FINDINGS: CTA NECK FINDINGS Aortic arch: Standard 3 vessel aortic arch with widely patent arch vessel origins. Right carotid system: Patent with mild calcified and soft plaque at the carotid bifurcation. No evidence of significant stenosis or dissection. Left carotid system: Patent with mild calcified and soft plaque at the carotid bifurcation. No evidence of significant stenosis or dissection. Vertebral arteries: The vertebral arteries are patent with  the right being strongly dominant. There is the suggestion of a moderate proximal right V1 stenosis although shoulder artifact may also be contributing to this appearance. Skeleton: Cervical spondylosis most notable at C6-7 where asymmetric left uncovertebral spurring results in moderate left neural foraminal stenosis. Other neck: No evidence of cervical lymphadenopathy or mass. Upper chest: Clear lung apices. Review of the MIP images confirms the above findings CTA HEAD FINDINGS Anterior circulation: The internal carotid arteries are patent from skull base to carotid termini with mild atherosclerotic plaque bilaterally not resulting in significant stenosis. ACAs and MCAs are patent without evidence of a proximal branch occlusion or significant A1 or M1 stenosis. There is atherosclerotic irregularity of the branch vessels as well as a focal severe proximal left M2 branch stenosis. There is a median artery of the corpus callosum with a severe stenosis distally. No aneurysm is identified. Posterior circulation: The  intracranial vertebral arteries are patent to the basilar with mild atherosclerotic narrowing on the right. The left V4 segment is diffusely small with multiple superimposed stenoses including a severe stenosis distally. Patent AICA and SCA origins are visualized bilaterally. The basilar artery is widely patent. There is a fetal origin of the right PCA. Both PCAs demonstrate mild diffuse atherosclerotic irregularity without evidence of a flow limiting proximal stenosis. No aneurysm is identified. Venous sinuses: Patent. Anatomic variants: Strongly dominant right vertebral artery. Median artery of the corpus callosum. Review of the MIP images confirms the above findings IMPRESSION: 1. No large vessel occlusion. 2. Intracranial atherosclerosis including severe stenoses of a left M2 branch vessel and of the median artery of the corpus callosum. 3. Strongly dominant right vertebral artery with possible moderate proximal V1 stenosis. 4. Hypoplastic left vertebral artery with a severe distal V4 stenosis. 5. Widely patent cervical carotid arteries. Electronically Signed   By: Logan Bores M.D.   On: 06/05/2020 15:50   MR BRAIN WO CONTRAST  Result Date: 06/05/2020 CLINICAL DATA:  Right-sided weakness for 2 days. EXAM: MRI HEAD WITHOUT CONTRAST TECHNIQUE: Multiplanar, multiecho pulse sequences of the brain and surrounding structures were obtained without intravenous contrast. COMPARISON:  Head CT and CTA 06/05/2020 FINDINGS: Brain: There are patchy acute infarcts in the left paramedian pons, and there is an 8 mm acute right frontal white matter infarct inferior to the frontal horn. Chronic microhemorrhages in the right greater than left thalami are likely secondary to chronic hypertension. There is also a linear region of chronic hemorrhage in the right external capsule. Patchy to confluent T2 hyperintensities in the cerebral white matter bilaterally are nonspecific but compatible with severely age advanced chronic small  vessel ischemic disease. There are chronic lacunar infarcts in the thalami, right basal ganglia, and bilateral cerebral white matter. There is mildly age advanced cerebral atrophy. No mass, midline shift, or extra-axial fluid collection is identified. Vascular: Abnormal appearance of the distal left vertebral artery with severe atherosclerotic stenosis shown on earlier CTA. Other major intracranial vascular flow voids are preserved. Skull and upper cervical spine: Unremarkable bone marrow signal. Sinuses/Orbits: Right cataract extraction. Paranasal sinuses and mastoid air cells are clear. Other: None. IMPRESSION: 1. Acute left pontine and right frontal white matter infarcts. 2. Severe chronic small vessel ischemic disease. Electronically Signed   By: Logan Bores M.D.   On: 06/05/2020 17:53    Medications:  Scheduled: .  stroke: mapping our early stages of recovery book   Does not apply Once  . allopurinol  300 mg Oral Daily  . aspirin  300 mg  Rectal Daily   Or  . aspirin EC  325 mg Oral Daily  . atorvastatin  80 mg Oral Daily  . cholecalciferol  1,000 Units Oral Daily  . finasteride  5 mg Oral Daily  . [START ON 06/07/2020] hydrocortisone cream   Topical Q M,W,F  . insulin aspart  0-20 Units Subcutaneous Q4H  . sodium chloride flush  3 mL Intravenous Once    Assessment: 69 y.o. male presenting with new onset of right sided weakness. Symptoms were first noted 2 days ago.  1. On exam today he continues to exhibit right face, arm and leg weakness with mild RLE ataxia.  2. MRI brain reveals an acute left pontine and right frontal white matter infarcts. Severe chronic small vessel ischemic disease is also noted.  3. CTA of head and neck: No large vessel occlusion. Intracranial atherosclerosis including severe stenoses of a left M2 branch vessel and of the median artery of the corpus callosum. Strongly dominant right vertebral artery with possible moderate proximal V1 stenosis. Hypoplastic left  vertebral artery with a severe distal V4 stenosis. Widely patent cervical carotid arteries. 4. EKG (9/6): Normal sinus rhythm. Prolonged QT.  5. Stroke Risk Factors - Cancer history, DM, dyslipidemia, HTN and obesity  6. HgbA1c elevated at 7.5 7. Abnormal lipid profile 8. Recent lower GIB with BRBPR. Unknown bleeding source.   Recommendations: 1. BP management. Out of the permissive HTN time window 2. Echocardiogram completed with report pending.  3. PT consult, OT consult, Speech consult 4. Frequent neuro checks 5. Continue ASA at 325 mg po qd. Due to severe intracranial atherosclerosis, would consider adding Plavix after source of his lower GIB is found and treated.   6. Continue atorvastatin  7. Telemetry monitoring 8. When able, he should be encouraged to engage in a daily light exercise program of 30 minutes walking or cycling per day. Recommend low intensity aerobic exercise without breaking into a sweat or significant SOB. Intensity should be such that he is able to carry on a conversation while exercising.  9. MRI reviewed and discussed with the patient and his wife.   Addendum: --Echocardiogram report completed. No PFO or mural thrombus is described in the report. LVEF is  >55%.  --Will need outpatient Neurology follow up.  --Neurohospitalist service will sign off. Please call if there are additional questions.    LOS: 1 day   @Electronically  signed: Dr. Kerney Elbe 06/06/2020  4:00 PM

## 2020-06-06 NOTE — Progress Notes (Signed)
Rehab Admissions Coordinator Note:  Patient was screened by Cleatrice Burke for appropriateness for an Inpatient Acute Rehab admit at [facility].   At this time, we are recommending Inpatient Rehab consult. I will place order per protocol and one of our Admission coordinators will follow up.  Cleatrice Burke RN MSN 06/06/2020, 10:39 AM  I can be reached at [phone].

## 2020-06-07 ENCOUNTER — Ambulatory Visit: Payer: Medicare Other

## 2020-06-07 LAB — GLUCOSE, CAPILLARY
Glucose-Capillary: 136 mg/dL — ABNORMAL HIGH (ref 70–99)
Glucose-Capillary: 147 mg/dL — ABNORMAL HIGH (ref 70–99)
Glucose-Capillary: 149 mg/dL — ABNORMAL HIGH (ref 70–99)
Glucose-Capillary: 150 mg/dL — ABNORMAL HIGH (ref 70–99)
Glucose-Capillary: 162 mg/dL — ABNORMAL HIGH (ref 70–99)
Glucose-Capillary: 175 mg/dL — ABNORMAL HIGH (ref 70–99)
Glucose-Capillary: 181 mg/dL — ABNORMAL HIGH (ref 70–99)

## 2020-06-07 LAB — OCCULT BLOOD X 1 CARD TO LAB, STOOL: Fecal Occult Bld: POSITIVE — AB

## 2020-06-07 LAB — HEMOGLOBIN: Hemoglobin: 12.1 g/dL — ABNORMAL LOW (ref 13.0–17.0)

## 2020-06-07 MED ORDER — LOSARTAN POTASSIUM 50 MG PO TABS
100.0000 mg | ORAL_TABLET | Freq: Every day | ORAL | Status: DC
  Administered 2020-06-07 – 2020-06-14 (×8): 100 mg via ORAL
  Filled 2020-06-07 (×8): qty 2

## 2020-06-07 MED ORDER — AMLODIPINE BESYLATE 5 MG PO TABS
5.0000 mg | ORAL_TABLET | Freq: Every day | ORAL | Status: DC
  Administered 2020-06-07 – 2020-06-08 (×2): 5 mg via ORAL
  Filled 2020-06-07 (×3): qty 1

## 2020-06-07 MED ORDER — ENSURE MAX PROTEIN PO LIQD
11.0000 [oz_av] | Freq: Two times a day (BID) | ORAL | Status: DC
  Administered 2020-06-07 – 2020-06-14 (×12): 11 [oz_av] via ORAL
  Filled 2020-06-07: qty 330

## 2020-06-07 MED ORDER — ENSURE ENLIVE PO LIQD
237.0000 mL | Freq: Two times a day (BID) | ORAL | Status: DC
  Administered 2020-06-07: 237 mL via ORAL

## 2020-06-07 NOTE — Progress Notes (Signed)
Inpatient Rehab Admissions:  Inpatient Rehab Consult received.  I spoke with pt and his wife over the phone for rehabilitation assessment and to discuss goals and expectations of an inpatient rehab admission.  They are both anxious for CIR and for pt to regain some independence prior to returning home.  Pt's wife is retired and can provide up to min assist if needed at discharge from [place].  Will continue to follow for timing of potential admission pending bed availability.   Signed: Shann Medal, PT, DPT Admissions Coordinator [phone] 06/07/20  11:44 AM

## 2020-06-07 NOTE — Evaluation (Addendum)
Clinical/Bedside Swallow Evaluation Patient Details  Name: Peter Ryan MRN: [id number] Date of Birth: [birth year]  Today's Date: 06/07/2020 Time: SLP Start Time (ACUTE ONLY): 23 SLP Stop Time (ACUTE ONLY): 1320 SLP Time Calculation (min) (ACUTE ONLY): 60 min  Past Medical History:  Past Medical History:  Diagnosis Date  . Arthritis   . BPH (benign prostatic hyperplasia)   . Cancer ([place])    Non-Hodgkins lymphoma  . Chronic prostatitis   . Cirrhosis of liver ([place])   . Diabetes mellitus without complication ([place])   . Dyslipidemia   . Elevated PSA   . Gastric reflux   . Gout   . HTN (hypertension)   . Neuropathy   . Over weight   . Psoriasis   . Testicular pain, right   . Urinary frequency    Past Surgical History:  Past Surgical History:  Procedure Laterality Date  . hydrocelectomy    . IR FLUORO GUIDE PORT INSERTION RIGHT  04/11/2017  . IR REMOVAL TUN ACCESS W/ PORT W/O FL MOD SED  05/29/2018  . PILONIDAL CYST EXCISION     HPI:  Pt is a 69 y.o. male with medical history significant for obesity, BPH, hypertension, diabetes mellitus, dyslipidemia and non-Hodgkin's lymphoma status post chemotherapy currently under surveillance who presents to the emergency room for evaluation of right-sided weakness which started 1 day prior to his presentation but was more pronounced on the day of admission when he got up to use the bathroom and fell on his bottom. Wife reports speech is slurred. Pt denies hitting his head when he fell. He denies having any difficulty swallowing, blurred vision or headache. MRI Findings (06/05/20): Acute left pontine and right frontal white matter infarcts. Severe chronic small vessel ischemic disease.   Assessment / Plan / Recommendation Clinical Impression  Pt appears to present w/ grossly adequate oropharyngeal phase swallow w/ No immediate, oropharyngeal phase dysphagia noted. Pt consumed po trials w/ No immediate, overt clinical s/s of aspiration during po  trials and no reported difficulty swallowing consistencies/trials given. However, he did exhibit a mild, delayed throat clearing b/t po trials given and when talking -- pt's wife and pt stated he "clears his throat often at home both when not eating and when eating meals". Wife also mentioned/questioned about his blood pressure medication causing "this" -- encouraged her to talk w/ MD to clarify. This mild throat clearing PRIOR to any po's given and when talking w/ pt upon entering room; it was noted intermittently between trial consistencies and did NOT increase in intensity or frequency as trials continued. Unsure if this is a true Baseline issue for pt or if any impact d/t recent (new) L CVA. Pt appears at reduced risk for aspiration following general aspiration precautions including NO STRAW USE and focusing on following the general aspiration precautions(pt endorsed eating too much too fast at home).  During po trials, pt consumed all consistencies w/ no decline in vocal quality or change in respiratory presentation during/post trials. Oral phase appeared [facility] w/ timely bolus management, mastication, and control of bolus propulsion for A-P transfer for swallowing. Oral clearing achieved w/ all trial consistencies. OM Exam appeared [facility] w/ no gross unilateral weakness noted. Pt fed self w/ setup support. Of note, both Wife and pt endorsed "nervousness" about pt's eating at meals; this was discussed w/ both and questions answered.  Recommend a more Mech Soft consistency diet w/ well-Cut meats, moistened foods; Thin liquidsVIA CUP - pt not to use Straws for better control when  drinking. Recommend general aspiration precautions, Pills WHOLE in Puree for safer, easier swallowing as pt described some pills were difficult to swallow. Education given on Pills in Puree; food consistencies and easy to eat options; general aspiration precautions; handouts given. ST services will f/u w/ pt's toleration of diet next 1-2  days; education. NSG agreed.  SLP Visit Diagnosis: Dysphagia, unspecified (R13.10)    Aspiration Risk   (reduced following general aspiration precautions)    Diet Recommendation  Mech Soft diet consistency foods, moistened; Thin liquids VIA CUP - No Straws. General aspiration precautions; tray setup support d/t RUE weakness, positioning support  Medication Administration: Whole meds with puree (for safer swallowing)    Other  Recommendations Recommended Consults:  (Dietician f/u for support - dec'd appetite) Oral Care Recommendations: Oral care BID;Oral care before and after PO;Patient independent with oral care (w/ support) Other Recommendations:  (n/a )   Follow up Recommendations  (TBD)      Frequency and Duration min 2x/week  1 week       Prognosis Prognosis for Safe Diet Advancement: Good Barriers to Reach Goals: Time post onset;Severity of deficits      Swallow Study   General Date of Onset: 06/05/20 HPI: Pt is a 69 y.o. male with medical history significant for obesity, BPH, hypertension, diabetes mellitus, dyslipidemia and non-Hodgkin's lymphoma status post chemotherapy currently under surveillance who presents to the emergency room for evaluation of right-sided weakness which started 1 day prior to his presentation but was more pronounced on the day of admission when he got up to use the bathroom and fell on his bottom. Wife reports speech is slurred. Pt denies hitting his head when he fell. He denies having any difficulty swallowing, blurred vision or headache. MRI Findings (06/05/20): Acute left pontine and right frontal white matter infarcts. Severe chronic small vessel ischemic disease. Type of Study: Bedside Swallow Evaluation Previous Swallow Assessment: none Diet Prior to this Study: Regular;Thin liquids Temperature Spikes Noted: No Respiratory Status: Room air History of Recent Intubation: No Behavior/Cognition: Alert;Cooperative;Pleasant mood Oral Cavity  Assessment: Within Functional Limits Oral Care Completed by SLP: Recent completion by staff Oral Cavity - Dentition: Adequate natural dentition Vision: Functional for self-feeding Self-Feeding Abilities: Able to feed self;Needs assist;Needs set up (RUE weakness s/p stroke) Patient Positioning: Upright in chair Baseline Vocal Quality: Normal;Low vocal intensity (min -- needed cues to use his articulation strats) Volitional Cough: Strong Volitional Swallow: Able to elicit    Oral/Motor/Sensory Function Overall Oral Motor/Sensory Function: Mild impairment (-Moderate impairment) Facial ROM: Within Functional Limits Facial Symmetry: Within Functional Limits Facial Strength: Within Functional Limits Lingual ROM: Within Functional Limits Lingual Symmetry: Within Functional Limits (grossly - no significant deviation) Lingual Strength: Within Functional Limits (especially posterior) Lingual Sensation: Within Functional Limits Velum: Within Functional Limits Mandible: Within Functional Limits   Ice Chips Ice chips: Within functional limits Presentation: Spoon   Thin Liquid Thin Liquid: Impaired (mild, inconsistent but overall adequate) Presentation: Cup;Self Fed (10 trials) Oral Phase Impairments: Reduced labial seal (R side - min) Oral Phase Functional Implications:  (none) Pharyngeal  Phase Impairments: Throat Clearing - Delayed (x3 b/t trials but not immediately following) Other Comments: pt's wife and pt stated he clears his throat often at home both when not eating/eating meals -- stated a quesion about his blood pressure medication    Nectar Thick Nectar Thick Liquid: Not tested   Honey Thick Honey Thick Liquid: Not tested   Puree Puree: Within functional limits Presentation: Spoon;Self Fed (6 trials)  Other Comments: throat clear x1 b/t trials   Solid     Solid: Within functional limits Presentation: Spoon;Self Fed (4 trials) Other Comments: throat clear x1 b/t trials        Orinda Kenner, MS, CCC-SLP Speech Language Pathologist Rehab Services [phone] Rose Hippler 06/07/2020,5:52 PM

## 2020-06-07 NOTE — Progress Notes (Signed)
Physical Therapy Treatment Patient Details Name: Peter Ryan MRN: [id number] DOB: 12-28-1950 Today's Date: 06/07/2020    History of Present Illness HPI: Peter Ryan is a 69 y.o. male with medical history significant for obesity, BPH, hypertension, diabetes mellitus, dyslipidemia and non-Hodgkin's lymphoma status post chemotherapy currently under surveillance who presents to the emergency room for evaluation of right-sided weakness which started 1 day prior to his presentation but was more pronounced on the day of admission when he got up to use the bathroom and fell on his bottom. He was unable to get up and his wife was unable to assist him up so she called EMS. EMS was able to assist patient back to bed.  He has not been able to ambulate since his fall this morning. His wife also states that his speech was slurred. Imaging reveals R pontine and L frontal infarcts.     PT Comments    Pt lying in bed upon arrival to room with wife at bedside. Pt initially reporting that he was soiled of urine. Pt performed bed mobility with max A for rolling to L and R for coming into sidelying position. Verbal and tactile cues for hand/foot placement and for sequencing of activity. Pt then performed supine to sit with max A for truncal elevation into sitting from elevated HOB and max A for pivoting hips to sit edge of bed. Once edge of bed, pt able to maintain static and dynamic sitting with SBA for safety. Pt performed balance and RUE reaching activities. Max+2 for stand pivot sit transfer from bed to chair for boost into standing, balance while upright, and for eccentric control on descent. Pt attempted to take steps during transfer with max verbal cues for sequencing, however pt with poor foot clearance. Pt reporting that his speech is the same as it was on arrival and states that his sitting balance has improved since yesterday. Pt left sitting up in chair with needs within reach and wife present. Pt continues  to demonstrate good motivation for getting back to prior level of function and willingness to work hard to gain back as much independence for decreased caregiver burden.    Follow Up Recommendations  CIR;Supervision/Assistance - 24 hour     Equipment Recommendations  Other (comment) (TBD, has rollator but will likely need RW )    Recommendations for Other Services       Precautions / Restrictions Precautions Precautions: Fall Restrictions Weight Bearing Restrictions: No    Mobility  Bed Mobility Overal bed mobility: Needs Assistance Bed Mobility: Supine to Sit;Rolling Rolling: Max assist Sidelying to sit: Max assist;HOB elevated       General bed mobility comments: pt able to self initiate rolling to L and R and required max A for coming into sidelying position for pericare, verbal and tactile cues for hand and foot placement; Max A to bring trunk upright into sitting from elevated HOB, and for pivoting hips to sit edge of bed  Transfers Overall transfer level: Needs assistance Equipment used: None Transfers: Stand Pivot Transfers   Stand pivot transfers: +2 physical assistance;Max assist       General transfer comment: pt performed stand pivot sit from bed to recliner chair with max+2 A for boosting to stand, balance while upright, and eccentric control on descent  Ambulation/Gait             General Gait Details: pt able to perform minimal stepping while performeing stand pivot sit transfer to improve position of BLE during transfer  Stairs             Wheelchair Mobility    Modified Rankin (Stroke Patients Only)       Balance Overall balance assessment: Needs assistance Sitting-balance support: Bilateral upper extremity supported Sitting balance-Leahy Scale: Fair Sitting balance - Comments: pt able to maintain static sitting balance with BUE on bed with occasional LOB however able to self correct   Standing balance support: Bilateral upper  extremity supported Standing balance-Leahy Scale: Poor Standing balance comment: max+2 A for upright balance during dynamic activity                            Cognition Arousal/Alertness: Awake/alert Behavior During Therapy: WFL for tasks assessed/performed Overall Cognitive Status: Within Functional Limits for tasks assessed                                        Exercises Other Exercises Other Exercises: pt performed balance activities sitting with unsupported back; noted pt would over-correct with perturbations however no overt LOB noted Other Exercises: pt performed reaching activities with RUE across midline and in various directions; squeeze ball given for R hand grasping    General Comments        Pertinent Vitals/Pain Pain Assessment: No/denies pain    Home Living                      Prior Function            PT Goals (current goals can now be found in the care plan section) Acute Rehab PT Goals Patient Stated Goal: get back to walking and being able to do for himself PT Goal Formulation: With patient/family Time For Goal Achievement: 06/20/20 Potential to Achieve Goals: Fair Progress towards PT goals: Progressing toward goals    Frequency    7X/week      PT Plan Current plan remains appropriate    Co-evaluation              AM-PAC PT "6 Clicks" Mobility   Outcome Measure  Help needed turning from your back to your side while in a flat bed without using bedrails?: A Lot Help needed moving from lying on your back to sitting on the side of a flat bed without using bedrails?: A Lot Help needed moving to and from a bed to a chair (including a wheelchair)?: A Lot Help needed standing up from a chair using your arms (e.g., wheelchair or bedside chair)?: A Lot Help needed to walk in hospital room?: Total Help needed climbing 3-5 steps with a railing? : Total 6 Click Score: 10    End of Session Equipment  Utilized During Treatment: Gait belt Activity Tolerance: Patient tolerated treatment well Patient left: in chair;with call bell/phone within reach;with chair alarm set;with family/visitor present;with SCD's reapplied Nurse Communication: Mobility status PT Visit Diagnosis: Muscle weakness (generalized) (M62.81);Difficulty in walking, not elsewhere classified (R26.2);Other symptoms and signs involving the nervous system (R29.898);Hemiplegia and hemiparesis Hemiplegia - Right/Left: Right Hemiplegia - dominant/non-dominant: Dominant Hemiplegia - caused by: Cerebral infarction     Time: 7510-2585 PT Time Calculation (min) (ACUTE ONLY): 31 min  Charges:                        Vale Haven, SPT   Vale Haven 06/07/2020, 12:57 PM

## 2020-06-07 NOTE — Progress Notes (Signed)
PT Cancellation Note  Patient Details Name: Peter Ryan MRN: [id number] DOB: [birth year]   Cancelled Treatment:    Reason Eval/Treat Not Completed: Other (comment) Upon arrival to room, nurse tech giving patient a bath so pt unavailable at this time. Will attempt treatment another date/time when available.   Vale Haven 06/07/2020, 10:32 AM

## 2020-06-07 NOTE — Progress Notes (Signed)
Initial Nutrition Assessment  DOCUMENTATION CODES:   Morbid obesity  INTERVENTION:  Provide Ensure Max Protein po BID, each supplement provides 150 kcal and 30 grams of protein.  Encouraged adequate intake at meals.  NUTRITION DIAGNOSIS:   Inadequate oral intake related to decreased appetite as evidenced by per patient/family report.  GOAL:   Patient will meet greater than or equal to 90% of their needs  MONITOR:   PO intake, Supplement acceptance, Labs, Weight trends, I & O's  REASON FOR ASSESSMENT:   Consult Assessment of nutrition requirement/status  ASSESSMENT:   69 year old male with PMHx of gastric reflux, psoriasis, arthritis, gout, HTN, cirrhosis of liver, Non-Hodgkin's lymphoma s/p chemotherapy currently under surveillance, DM admitted with acute left pontine ischemic infarction.   Met with patient and his wife at bedside. Patient reports he had a good appetite and intake at baseline. He ate 3 meals daily PTA. Patient reports he has not been eating well since admission 2 days ago and was mainly only having liquids. Wife reports it is because they were afraid he was choking since he was coughing. She reports they spoke with SLP today and were reassured that patient was not choking on food and it is okay for him to eat (note still not in chart). Patient reports he is also not feeling hungry and has a decreased appetite but understands he needs the nutrition. Per chart patient ate 0% of breakfast on 9/7 and today. No other meal documentation available in chart. Patient reports he will begin eating more at meals. Wife ordered his dinner already for tonight. He also reports he likes Ensure Max Protein and would like to drink these between meals.   Patient denies any weight loss and reports he is weight-stable. Patient currently 129.1 kg (284.61 lbs).  Medications reviewed and include: vitamin D3 1000 units daily, Novolog 0-20 units Q4hrs.  Labs reviewed: CBG  136-162.  Patient does not meet criteria for malnutrition at this time.  NUTRITION - FOCUSED PHYSICAL EXAM:    Most Recent Value  Orbital Region No depletion  Upper Arm Region No depletion  Thoracic and Lumbar Region No depletion  Buccal Region No depletion  Temple Region No depletion  Clavicle Bone Region No depletion  Clavicle and Acromion Bone Region No depletion  Scapular Bone Region No depletion  Dorsal Hand No depletion  Patellar Region No depletion  Anterior Thigh Region No depletion  Posterior Calf Region No depletion  Edema (RD Assessment) None  Hair Reviewed  Eyes Reviewed  Mouth Reviewed  Skin Reviewed  Nails Reviewed     Diet Order:   Diet Order            Diet heart healthy/carb modified Room service appropriate? Yes; Fluid consistency: Thin  Diet effective now                EDUCATION NEEDS:   No education needs have been identified at this time  Skin:  Skin Assessment: Reviewed RN Assessment  Last BM:  06/05/2020 per chart  Height:   Ht Readings from Last 1 Encounters:  06/05/20 $RemoveB'5\' 10"'[id number]$  (1.778 m)   Weight:   Wt Readings from Last 1 Encounters:  06/05/20 129.1 kg   BMI:  Body mass index is 40.84 kg/m.  Estimated Nutritional Needs:   Kcal:  2500-2700  Protein:  125-135 grams  Fluid:  >/= 2.2 L/day  Jacklynn Barnacle, MS, RD, LDN Pager number available on Amion

## 2020-06-07 NOTE — Progress Notes (Signed)
PROGRESS NOTE    Peter Ryan  FAO:[id number] DOB: 16-Jun-1951 DOA: 06/05/2020 PCP: [name]., MD  Brief Narrative:  Peter Ryan is a 69 y.o. male with medical history significant forobesity, BPH, hypertension, diabetes mellitus, dyslipidemia and non-Hodgkin's lymphoma status post chemotherapy currently under surveillance.  He presented to the hospital because of right-sided weakness that started on the day prior to admission.  He still felt weak in his right leg the following day and he fell on the floor when he tried to get up to use the bathroom.  His wife also noticed slurred speech.  MRI brain showed acute left pontine and right frontal white matter infarcts.  He was on low-dose aspirin at home and neurologist has recommended aspirin be increased to 325 mg daily.  9/8: Patient seen and examined.  Wife at bedside.  Complains of weakness.   Assessment & Plan:   Principal Problem:   Acute CVA (cerebrovascular accident) ([place]) Active Problems:   Type 2 diabetes mellitus (HCC)   Benign essential hypertension   Obesity, Class III, BMI 40-49.9 (morbid obesity) ([place])  Acute CVA, left pontine acute infarction 2 days prior to admission Not a TPA candidate given last known time well MRI confirmed infarction CTA head and neck: No LVO EKG normal sinus rhythm Echocardiogram reassuring Plan: BP management, home medications restarted Continue aspirin 325 mg p.o. daily Continue Lipitor PT and OT as tolerated Frequent neuro checks SLP evaluation Patient could be a good candidate for dual antiplatelet therapy can considering severe intracranial atherosclerosis.  Will defer this addition for now.  Patient is currently undergoing outpatient work-up for suspected GI bleed.  I recommend that the patient go back and see the gastroenterologist to achieve resolution on GI bleed.  Once this is achieved outpatient neurology can evaluate for need regarding dual antiplatelet  therapy  Patient can discharge to CIR once bed available  Hypertension Resume amlodipine, half home dose 5 mg daily Resume losartan 100 mg nightly  Type 2 diabetes mellitus On sliding scale insulin Can resume home regimen at discharge  Morbid obesity Dietitian consult  Gout Continue allopurinol  DVT prophylaxis: SCD Code Status: Full Family Communication: Wife at bedside Disposition Plan: Status is: Inpatient  Remains inpatient appropriate because:Unsafe d/c plan   Dispo: The patient is from: Home              Anticipated d/c is to: CIR              Anticipated d/c date is: 1 day              Patient currently is medically stable to d/c.   Patient currently medically stable for discharge to CIR.  Can discharge pending bed availability    Consultants:   Neurology  Procedures:   None  Antimicrobials:   None   Subjective: Patient seen and examined.  Endorses some fatigue.  Objective: Vitals:   06/06/20 1638 06/07/20 0022 06/07/20 0804 06/07/20 1410  BP: (!) 162/86 (!) 144/73 (!) 162/90 (!) 151/72  Pulse: 85 73 75 83  Resp: 18 15 16 20   Temp: 97.9 F (36.6 C) 97.9 F (36.6 C) 98.4 F (36.9 C) 98.4 F (36.9 C)  TempSrc: Oral Oral Oral Oral  SpO2: 98% 98% 96% 96%  Weight:      Height:        Intake/Output Summary (Last 24 hours) at 06/07/2020 1421 Last data filed at 06/07/2020 1335 Gross per 24 hour  Intake 120 ml  Output --  Net 120 ml   Filed Weights   06/05/20 1051  Weight: 129.1 kg    Examination:  General exam: Appears calm and comfortable no acute distress Respiratory system: Clear to auscultation. Respiratory effort normal. Cardiovascular system: S1 & S2 heard, RRR. No JVD, murmurs, rubs, gallops or clicks. No pedal edema. Gastrointestinal system: Obese, nontender, nondistended, normal bowel sounds Central nervous system: Alert, oriented x3.  Dysarthria noted right-sided weakness Extremities: Right upper and right lower extremity  weakness Skin: No rashes, lesions or ulcers Psychiatry: Judgement and insight appear normal. Mood & affect appropriate.     Data Reviewed: I have personally reviewed following labs and imaging studies  CBC: Recent Labs  Lab 06/05/20 1100  WBC 11.4*  NEUTROABS 8.9*  HGB 11.9*  HCT 34.0*  MCV 85.2  PLT 829   Basic Metabolic Panel: Recent Labs  Lab 06/05/20 1100  NA 140  K 3.4*  CL 103  CO2 22  GLUCOSE 252*  BUN 19  CREATININE 0.81  CALCIUM 8.6*   GFR: Estimated Creatinine Clearance: 119.4 mL/min (by C-G formula based on SCr of 0.81 mg/dL). Liver Function Tests: Recent Labs  Lab 06/05/20 1100  AST 27  ALT 21  ALKPHOS 56  BILITOT 0.7  PROT 7.2  ALBUMIN 3.9   No results for input(s): LIPASE, AMYLASE in the last 168 hours. No results for input(s): AMMONIA in the last 168 hours. Coagulation Profile: Recent Labs  Lab 06/05/20 1100  INR 1.0   Cardiac Enzymes: No results for input(s): CKTOTAL, CKMB, CKMBINDEX, TROPONINI in the last 168 hours. BNP (last 3 results) No results for input(s): PROBNP in the last 8760 hours. HbA1C: Recent Labs    06/06/20 0413  HGBA1C 7.5*   CBG: Recent Labs  Lab 06/06/20 2058 06/07/20 0024 06/07/20 0438 06/07/20 0803 06/07/20 1131  GLUCAP 154* 136* 150* 149* 162*   Lipid Profile: Recent Labs    06/06/20 0413  CHOL 170  HDL 29*  LDLCALC 77  TRIG 321*  CHOLHDL 5.9   Thyroid Function Tests: No results for input(s): TSH, T4TOTAL, FREET4, T3FREE, THYROIDAB in the last 72 hours. Anemia Panel: No results for input(s): VITAMINB12, FOLATE, FERRITIN, TIBC, IRON, RETICCTPCT in the last 72 hours. Sepsis Labs: No results for input(s): PROCALCITON, LATICACIDVEN in the last 168 hours.  Recent Results (from the past 240 hour(s))  SARS Coronavirus 2 by RT PCR (hospital order, performed in [facility] hospital lab) Nasopharyngeal Nasopharyngeal Swab     Status: None   Collection Time: 06/05/20  2:39 PM   Specimen:  Nasopharyngeal Swab  Result Value Ref Range Status   SARS Coronavirus 2 NEGATIVE NEGATIVE Final    Comment: (NOTE) SARS-CoV-2 target nucleic acids are NOT DETECTED.  The SARS-CoV-2 RNA is generally detectable in upper and lower respiratory specimens during the acute phase of infection. The lowest concentration of SARS-CoV-2 viral copies this assay can detect is 250 copies / mL. A negative result does not preclude SARS-CoV-2 infection and should not be used as the sole basis for treatment or other patient management decisions.  A negative result may occur with improper specimen collection / handling, submission of specimen other than nasopharyngeal swab, presence of viral mutation(s) within the areas targeted by this assay, and inadequate number of viral copies (<250 copies / mL). A negative result must be combined with clinical observations, patient history, and epidemiological information.  Fact Sheet for Patients:   StrictlyIdeas.no  Fact Sheet for Healthcare Providers: BankingDealers.co.za  This test is not yet approved  or  cleared by the [place] and has been authorized for detection and/or diagnosis of SARS-CoV-2 by FDA under an Emergency Use Authorization (EUA).  This EUA will remain in effect (meaning this test can be used) for the duration of the COVID-19 declaration under Section 564(b)(1) of the Act, 21 U.S.C. section 360bbb-3(b)(1), unless the authorization is terminated or revoked sooner.  Performed at [facility], [address] [place], [place] [address]          Radiology Studies: CT Angio Head W or Wo Contrast  Result Date: 06/05/2020 CLINICAL DATA:  Right-sided weakness for 2 days with a fall today. Facial droop and slurred speech. EXAM: CT ANGIOGRAPHY HEAD AND NECK TECHNIQUE: Multidetector CT imaging of the head and neck was performed using the standard protocol during bolus administration  of intravenous contrast. Multiplanar CT image reconstructions and MIPs were obtained to evaluate the vascular anatomy. Carotid stenosis measurements (when applicable) are obtained utilizing NASCET criteria, using the distal internal carotid diameter as the denominator. CONTRAST:  18mL OMNIPAQUE IOHEXOL 350 MG/ML SOLN COMPARISON:  None. FINDINGS: CTA NECK FINDINGS Aortic arch: Standard 3 vessel aortic arch with widely patent arch vessel origins. Right carotid system: Patent with mild calcified and soft plaque at the carotid bifurcation. No evidence of significant stenosis or dissection. Left carotid system: Patent with mild calcified and soft plaque at the carotid bifurcation. No evidence of significant stenosis or dissection. Vertebral arteries: The vertebral arteries are patent with the right being strongly dominant. There is the suggestion of a moderate proximal right V1 stenosis although shoulder artifact may also be contributing to this appearance. Skeleton: Cervical spondylosis most notable at C6-7 where asymmetric left uncovertebral spurring results in moderate left neural foraminal stenosis. Other neck: No evidence of cervical lymphadenopathy or mass. Upper chest: Clear lung apices. Review of the MIP images confirms the above findings CTA HEAD FINDINGS Anterior circulation: The internal carotid arteries are patent from skull base to carotid termini with mild atherosclerotic plaque bilaterally not resulting in significant stenosis. ACAs and MCAs are patent without evidence of a proximal branch occlusion or significant A1 or M1 stenosis. There is atherosclerotic irregularity of the branch vessels as well as a focal severe proximal left M2 branch stenosis. There is a median artery of the corpus callosum with a severe stenosis distally. No aneurysm is identified. Posterior circulation: The intracranial vertebral arteries are patent to the basilar with mild atherosclerotic narrowing on the right. The left V4  segment is diffusely small with multiple superimposed stenoses including a severe stenosis distally. Patent AICA and SCA origins are visualized bilaterally. The basilar artery is widely patent. There is a fetal origin of the right PCA. Both PCAs demonstrate mild diffuse atherosclerotic irregularity without evidence of a flow limiting proximal stenosis. No aneurysm is identified. Venous sinuses: Patent. Anatomic variants: Strongly dominant right vertebral artery. Median artery of the corpus callosum. Review of the MIP images confirms the above findings IMPRESSION: 1. No large vessel occlusion. 2. Intracranial atherosclerosis including severe stenoses of a left M2 branch vessel and of the median artery of the corpus callosum. 3. Strongly dominant right vertebral artery with possible moderate proximal V1 stenosis. 4. Hypoplastic left vertebral artery with a severe distal V4 stenosis. 5. Widely patent cervical carotid arteries. Electronically Signed   By: Logan Bores M.D.   On: 06/05/2020 15:50   CT Angio Neck W and/or Wo Contrast  Result Date: 06/05/2020 CLINICAL DATA:  Right-sided weakness for 2 days with a fall  today. Facial droop and slurred speech. EXAM: CT ANGIOGRAPHY HEAD AND NECK TECHNIQUE: Multidetector CT imaging of the head and neck was performed using the standard protocol during bolus administration of intravenous contrast. Multiplanar CT image reconstructions and MIPs were obtained to evaluate the vascular anatomy. Carotid stenosis measurements (when applicable) are obtained utilizing NASCET criteria, using the distal internal carotid diameter as the denominator. CONTRAST:  2mL OMNIPAQUE IOHEXOL 350 MG/ML SOLN COMPARISON:  None. FINDINGS: CTA NECK FINDINGS Aortic arch: Standard 3 vessel aortic arch with widely patent arch vessel origins. Right carotid system: Patent with mild calcified and soft plaque at the carotid bifurcation. No evidence of significant stenosis or dissection. Left carotid system:  Patent with mild calcified and soft plaque at the carotid bifurcation. No evidence of significant stenosis or dissection. Vertebral arteries: The vertebral arteries are patent with the right being strongly dominant. There is the suggestion of a moderate proximal right V1 stenosis although shoulder artifact may also be contributing to this appearance. Skeleton: Cervical spondylosis most notable at C6-7 where asymmetric left uncovertebral spurring results in moderate left neural foraminal stenosis. Other neck: No evidence of cervical lymphadenopathy or mass. Upper chest: Clear lung apices. Review of the MIP images confirms the above findings CTA HEAD FINDINGS Anterior circulation: The internal carotid arteries are patent from skull base to carotid termini with mild atherosclerotic plaque bilaterally not resulting in significant stenosis. ACAs and MCAs are patent without evidence of a proximal branch occlusion or significant A1 or M1 stenosis. There is atherosclerotic irregularity of the branch vessels as well as a focal severe proximal left M2 branch stenosis. There is a median artery of the corpus callosum with a severe stenosis distally. No aneurysm is identified. Posterior circulation: The intracranial vertebral arteries are patent to the basilar with mild atherosclerotic narrowing on the right. The left V4 segment is diffusely small with multiple superimposed stenoses including a severe stenosis distally. Patent AICA and SCA origins are visualized bilaterally. The basilar artery is widely patent. There is a fetal origin of the right PCA. Both PCAs demonstrate mild diffuse atherosclerotic irregularity without evidence of a flow limiting proximal stenosis. No aneurysm is identified. Venous sinuses: Patent. Anatomic variants: Strongly dominant right vertebral artery. Median artery of the corpus callosum. Review of the MIP images confirms the above findings IMPRESSION: 1. No large vessel occlusion. 2. Intracranial  atherosclerosis including severe stenoses of a left M2 branch vessel and of the median artery of the corpus callosum. 3. Strongly dominant right vertebral artery with possible moderate proximal V1 stenosis. 4. Hypoplastic left vertebral artery with a severe distal V4 stenosis. 5. Widely patent cervical carotid arteries. Electronically Signed   By: Logan Bores M.D.   On: 06/05/2020 15:50   MR BRAIN WO CONTRAST  Result Date: 06/05/2020 CLINICAL DATA:  Right-sided weakness for 2 days. EXAM: MRI HEAD WITHOUT CONTRAST TECHNIQUE: Multiplanar, multiecho pulse sequences of the brain and surrounding structures were obtained without intravenous contrast. COMPARISON:  Head CT and CTA 06/05/2020 FINDINGS: Brain: There are patchy acute infarcts in the left paramedian pons, and there is an 8 mm acute right frontal white matter infarct inferior to the frontal horn. Chronic microhemorrhages in the right greater than left thalami are likely secondary to chronic hypertension. There is also a linear region of chronic hemorrhage in the right external capsule. Patchy to confluent T2 hyperintensities in the cerebral white matter bilaterally are nonspecific but compatible with severely age advanced chronic small vessel ischemic disease. There are chronic lacunar infarcts in  the thalami, right basal ganglia, and bilateral cerebral white matter. There is mildly age advanced cerebral atrophy. No mass, midline shift, or extra-axial fluid collection is identified. Vascular: Abnormal appearance of the distal left vertebral artery with severe atherosclerotic stenosis shown on earlier CTA. Other major intracranial vascular flow voids are preserved. Skull and upper cervical spine: Unremarkable bone marrow signal. Sinuses/Orbits: Right cataract extraction. Paranasal sinuses and mastoid air cells are clear. Other: None. IMPRESSION: 1. Acute left pontine and right frontal white matter infarcts. 2. Severe chronic small vessel ischemic disease.  Electronically Signed   By: Logan Bores M.D.   On: 06/05/2020 17:53   ECHOCARDIOGRAM COMPLETE  Result Date: 06/06/2020    ECHOCARDIOGRAM REPORT   Patient Name:   Kejuan Andrew Date of Exam: 06/05/2020 Medical Rec #:  [id number]       Height:       70.0 in Accession #:    [id number]      Weight:       284.6 lb Date of Birth:  02-19-1951       BSA:          2.425 m Patient Age:    67 years        BP:           136/63 mmHg Patient Gender: M               HR:           76 bpm. Exam Location:  [facility] Procedure: 2D Echo, Cardiac Doppler and Color Doppler Indications:     Stroke 434.91 / I163.9  History:         Patient has prior history of Echocardiogram examinations. Risk                  Factors:Hypertension and Diabetes.  Sonographer:     Alyse Low Roar Referring Phys:  [id number] AGBATA Diagnosing Phys: Nelva Bush MD IMPRESSIONS  1. Left ventricular ejection fraction, by estimation, is >55%. The left ventricle has normal function. Left ventricular endocardial border not optimally defined to evaluate regional wall motion. There is mild left ventricular hypertrophy. Left ventricular diastolic parameters are consistent with Grade I diastolic dysfunction (impaired relaxation).  2. Right ventricular systolic function is normal. The right ventricular size is normal.  3. The mitral valve is grossly normal. No evidence of mitral valve regurgitation.  4. The aortic valve was not well visualized. Aortic valve regurgitation is not visualized. No aortic stenosis is present. FINDINGS  Left Ventricle: Left ventricular ejection fraction, by estimation, is >55%. The left ventricle has normal function. Left ventricular endocardial border not optimally defined to evaluate regional wall motion. The left ventricular internal cavity size was  normal in size. There is mild left ventricular hypertrophy. Left ventricular diastolic parameters are consistent with Grade I diastolic dysfunction (impaired relaxation). Right Ventricle:  The right ventricular size is normal. No increase in right ventricular wall thickness. Right ventricular systolic function is normal. Left Atrium: Left atrial size was not well visualized. Right Atrium: Right atrial size was not well visualized. Pericardium: Trivial pericardial effusion is present. Mitral Valve: The mitral valve is grossly normal. No evidence of mitral valve regurgitation. Tricuspid Valve: The tricuspid valve is not well visualized. Tricuspid valve regurgitation is not demonstrated. Aortic Valve: The aortic valve was not well visualized. Aortic valve regurgitation is not visualized. No aortic stenosis is present. Aortic valve peak gradient measures 5.2 mmHg. Pulmonic Valve: The pulmonic valve was not well visualized. Pulmonic valve  regurgitation is not visualized. No evidence of pulmonic stenosis. Aorta: The aortic root is normal in size and structure. Pulmonary Artery: The pulmonary artery is not well seen. Venous: The inferior vena cava was not well visualized. IAS/Shunts: The interatrial septum was not well visualized.  LEFT VENTRICLE PLAX 2D LVIDd:         5.33 cm  Diastology LVIDs:         4.02 cm  LV e' lateral:   6.96 cm/s LV PW:         1.11 cm  LV E/e' lateral: 10.4 LV IVS:        1.12 cm  LV e' medial:    6.96 cm/s LVOT diam:     2.30 cm  LV E/e' medial:  10.4 LVOT Area:     4.15 cm  RIGHT VENTRICLE RV S prime:     13.40 cm/s LEFT ATRIUM         Index LA diam:    4.20 cm 1.73 cm/m  AORTIC VALVE                PULMONIC VALVE AV Area (Vmax): 3.27 cm    PV Vmax:        0.96 m/s AV Vmax:        114.00 cm/s PV Peak grad:   3.7 mmHg AV Peak Grad:   5.2 mmHg    RVOT Peak grad: 2 mmHg LVOT Vmax:      89.70 cm/s  AORTA Ao Root diam: 3.40 cm MITRAL VALVE MV Area (PHT): 4.71 cm    SHUNTS MV Decel Time: 161 msec    Systemic Diam: 2.30 cm MV E velocity: 72.70 cm/s MV A velocity: 93.00 cm/s MV E/A ratio:  0.78 MV A Prime:    12.1 cm/s Nelva Bush MD Electronically signed by Nelva Bush MD  Signature Date/Time: 06/06/2020/6:34:37 PM    Final         Scheduled Meds: .  stroke: mapping our early stages of recovery book   Does not apply Once  . allopurinol  300 mg Oral Daily  . amLODipine  5 mg Oral Daily  . aspirin  300 mg Rectal Daily   Or  . aspirin EC  325 mg Oral Daily  . atorvastatin  80 mg Oral Daily  . cholecalciferol  1,000 Units Oral Daily  . feeding supplement (ENSURE ENLIVE)  237 mL Oral BID BM  . finasteride  5 mg Oral Daily  . hydrocortisone cream   Topical Q M,W,F  . insulin aspart  0-20 Units Subcutaneous Q4H  . losartan  100 mg Oral Daily  . sodium chloride flush  3 mL Intravenous Once   Continuous Infusions:   LOS: 2 days    Time spent: 15 minutes    Sidney Ace, MD [facility] Pager [phone]  If 7PM-7AM, please contact night-coverage 06/07/2020, 2:21 PM

## 2020-06-08 ENCOUNTER — Inpatient Hospital Stay: Payer: Medicare Other

## 2020-06-08 LAB — GLUCOSE, CAPILLARY
Glucose-Capillary: 156 mg/dL — ABNORMAL HIGH (ref 70–99)
Glucose-Capillary: 171 mg/dL — ABNORMAL HIGH (ref 70–99)
Glucose-Capillary: 177 mg/dL — ABNORMAL HIGH (ref 70–99)
Glucose-Capillary: 185 mg/dL — ABNORMAL HIGH (ref 70–99)
Glucose-Capillary: 163 mg/dL — ABNORMAL HIGH (ref 70–99)

## 2020-06-08 LAB — CBC WITH DIFFERENTIAL/PLATELET
Abs Immature Granulocytes: 0.03 10*3/uL (ref 0.00–0.07)
Basophils Absolute: 0.1 10*3/uL (ref 0.0–0.1)
Basophils Relative: 1 %
Eosinophils Absolute: 0.2 10*3/uL (ref 0.0–0.5)
Eosinophils Relative: 3 %
HCT: 32.8 % — ABNORMAL LOW (ref 39.0–52.0)
Hemoglobin: 11.7 g/dL — ABNORMAL LOW (ref 13.0–17.0)
Immature Granulocytes: 0 %
Lymphocytes Relative: 25 %
Lymphs Abs: 1.8 10*3/uL (ref 0.7–4.0)
MCH: 29.9 pg (ref 26.0–34.0)
MCHC: 35.7 g/dL (ref 30.0–36.0)
MCV: 83.9 fL (ref 80.0–100.0)
Monocytes Absolute: 0.7 10*3/uL (ref 0.1–1.0)
Monocytes Relative: 10 %
Neutro Abs: 4.4 10*3/uL (ref 1.7–7.7)
Neutrophils Relative %: 61 %
Platelets: 218 10*3/uL (ref 150–400)
RBC: 3.91 MIL/uL — ABNORMAL LOW (ref 4.22–5.81)
RDW: 14.4 % (ref 11.5–15.5)
WBC: 7.2 10*3/uL (ref 4.0–10.5)
nRBC: 0 % (ref 0.0–0.2)

## 2020-06-08 MED ORDER — AMLODIPINE BESYLATE 10 MG PO TABS
10.0000 mg | ORAL_TABLET | Freq: Every day | ORAL | Status: DC
  Administered 2020-06-09 – 2020-06-14 (×6): 10 mg via ORAL
  Filled 2020-06-08 (×6): qty 1

## 2020-06-08 MED ORDER — LORAZEPAM 2 MG/ML IJ SOLN
1.0000 mg | Freq: Once | INTRAMUSCULAR | Status: AC
  Administered 2020-06-08: 1 mg via INTRAVENOUS
  Filled 2020-06-08: qty 1

## 2020-06-08 MED ORDER — INSULIN ASPART 100 UNIT/ML ~~LOC~~ SOLN
0.0000 [IU] | Freq: Three times a day (TID) | SUBCUTANEOUS | Status: DC
  Administered 2020-06-08 (×3): 4 [IU] via SUBCUTANEOUS
  Administered 2020-06-09: 7 [IU] via SUBCUTANEOUS
  Administered 2020-06-09 – 2020-06-11 (×6): 4 [IU] via SUBCUTANEOUS
  Administered 2020-06-11: 7 [IU] via SUBCUTANEOUS
  Administered 2020-06-12 – 2020-06-13 (×5): 4 [IU] via SUBCUTANEOUS
  Administered 2020-06-13 (×2): 7 [IU] via SUBCUTANEOUS
  Administered 2020-06-14: 4 [IU] via SUBCUTANEOUS
  Filled 2020-06-08 (×19): qty 1

## 2020-06-08 MED ORDER — GADOBUTROL 1 MMOL/ML IV SOLN
10.0000 mL | Freq: Once | INTRAVENOUS | Status: AC | PRN
  Administered 2020-06-08: 10 mL via INTRAVENOUS

## 2020-06-08 NOTE — Care Management Important Message (Signed)
Important Message  Patient Details  Name: Peter Ryan MRN: [id number] Date of Birth: Mar 10, 1951   Medicare Important Message Given:  Yes     [name] 06/08/2020, 2:35 PM

## 2020-06-08 NOTE — Progress Notes (Signed)
Subjective: RUE has weakened significantly since last neuro exam. RLE has also weakened but less so. Repeat MRI brain was ordered, revealing more prominent DWI signal in the left paramedian pons, and one new linear focus of DWI abnormality at the midline. Patient and family are very concerned about the worsening.   Objective: Current vital signs: BP 133/73 (BP Location: Left Arm)   Pulse 83   Temp 98.5 F (36.9 C) (Oral)   Resp 16   Ht 5\' 10"  (1.778 m)   Wt 129.1 kg   SpO2 96%   BMI 40.84 kg/m  Vital signs in last 24 hours: Temp:  [97.8 F (36.6 C)-98.5 F (36.9 C)] 98.5 F (36.9 C) (09/09 1532) Pulse Rate:  [73-88] 83 (09/09 1532) Resp:  [16] 16 (09/09 1532) BP: (133-162)/(65-73) 133/73 (09/09 1532) SpO2:  [96 %-99 %] 96 % (09/09 1532)  Intake/Output from previous day: 09/08 0701 - 09/09 0700 In: 120 [P.O.:120] Out: 0  Intake/Output this shift: Total I/O In: 480 [P.O.:480] Out: 600 [Urine:600] Nutritional status:  Diet Order            Diet heart healthy/carb modified Room service appropriate? Yes with Assist; Fluid consistency: Thin  Diet effective now                HEENT: [place]/AT Lungs: Respirations unlabored Ext: Chronic pigmentary changes to distal BLE  Neurologic Exam: Ment: Mild drowsiness due to recent Ativan dose. Oriented. Able to follow all commands. Speech is fluent but dysarthric. Able to answer complex questions.  CN: PERRL. EOMI without nystagmus. Right facial droop is more prominent than on prior exam. Tongue protrudes midline.  Motor:  LUE and LLE 5/5 RUE 2/5 deltoid, biceps, triceps. 3/5 grip strength RLE 2/5 hip flexion, knee extension and ADF Sensory: Intact to FT   Lab Results: Results for orders placed or performed during the hospital encounter of 06/05/20 (from the past 48 hour(s))  Glucose, capillary     Status: Abnormal   Collection Time: 06/06/20  8:58 PM  Result Value Ref Range   Glucose-Capillary 154 (H) 70 - 99 mg/dL     Comment: Glucose reference range applies only to samples taken after fasting for at least 8 hours.  Glucose, capillary     Status: Abnormal   Collection Time: 06/07/20 12:24 AM  Result Value Ref Range   Glucose-Capillary 136 (H) 70 - 99 mg/dL    Comment: Glucose reference range applies only to samples taken after fasting for at least 8 hours.   Comment 1 Notify RN   Glucose, capillary     Status: Abnormal   Collection Time: 06/07/20  4:38 AM  Result Value Ref Range   Glucose-Capillary 150 (H) 70 - 99 mg/dL    Comment: Glucose reference range applies only to samples taken after fasting for at least 8 hours.   Comment 1 Notify RN   Glucose, capillary     Status: Abnormal   Collection Time: 06/07/20  8:03 AM  Result Value Ref Range   Glucose-Capillary 149 (H) 70 - 99 mg/dL    Comment: Glucose reference range applies only to samples taken after fasting for at least 8 hours.   Comment 1 Notify RN    Comment 2 Document in Chart   Glucose, capillary     Status: Abnormal   Collection Time: 06/07/20 11:31 AM  Result Value Ref Range   Glucose-Capillary 162 (H) 70 - 99 mg/dL    Comment: Glucose reference range applies only to samples  taken after fasting for at least 8 hours.   Comment 1 Notify RN    Comment 2 Document in Chart   Occult blood card to lab, stool     Status: Abnormal   Collection Time: 06/07/20  5:04 PM  Result Value Ref Range   Fecal Occult Bld POSITIVE (A) NEGATIVE    Comment: Performed at [facility], [address] [place], [place] [address]  Hemoglobin     Status: Abnormal   Collection Time: 06/07/20  5:16 PM  Result Value Ref Range   Hemoglobin 12.1 (L) 13.0 - 17.0 g/dL    Comment: Performed at [facility], [address] [place], [place] [address]  Glucose, capillary     Status: Abnormal   Collection Time: 06/07/20  5:19 PM  Result Value Ref Range   Glucose-Capillary 181 (H) 70 - 99 mg/dL    Comment: Glucose reference range applies only to  samples taken after fasting for at least 8 hours.   Comment 1 Notify RN    Comment 2 Document in Chart   Glucose, capillary     Status: Abnormal   Collection Time: 06/07/20  8:57 PM  Result Value Ref Range   Glucose-Capillary 175 (H) 70 - 99 mg/dL    Comment: Glucose reference range applies only to samples taken after fasting for at least 8 hours.   Comment 1 Notify RN   Glucose, capillary     Status: Abnormal   Collection Time: 06/07/20 11:50 PM  Result Value Ref Range   Glucose-Capillary 147 (H) 70 - 99 mg/dL    Comment: Glucose reference range applies only to samples taken after fasting for at least 8 hours.   Comment 1 Notify RN   Glucose, capillary     Status: Abnormal   Collection Time: 06/08/20  3:51 AM  Result Value Ref Range   Glucose-Capillary 156 (H) 70 - 99 mg/dL    Comment: Glucose reference range applies only to samples taken after fasting for at least 8 hours.   Comment 1 Notify RN   CBC with Differential/Platelet     Status: Abnormal   Collection Time: 06/08/20  5:07 AM  Result Value Ref Range   WBC 7.2 4.0 - 10.5 K/uL   RBC 3.91 (L) 4.22 - 5.81 MIL/uL   Hemoglobin 11.7 (L) 13.0 - 17.0 g/dL   HCT 32.8 (L) 39 - 52 %   MCV 83.9 80.0 - 100.0 fL   MCH 29.9 26.0 - 34.0 pg   MCHC 35.7 30.0 - 36.0 g/dL   RDW 14.4 11.5 - 15.5 %   Platelets 218 150 - 400 K/uL   nRBC 0.0 0.0 - 0.2 %   Neutrophils Relative % 61 %   Neutro Abs 4.4 1.7 - 7.7 K/uL   Lymphocytes Relative 25 %   Lymphs Abs 1.8 0.7 - 4.0 K/uL   Monocytes Relative 10 %   Monocytes Absolute 0.7 0 - 1 K/uL   Eosinophils Relative 3 %   Eosinophils Absolute 0.2 0 - 0 K/uL   Basophils Relative 1 %   Basophils Absolute 0.1 0 - 0 K/uL   Immature Granulocytes 0 %   Abs Immature Granulocytes 0.03 0.00 - 0.07 K/uL    Comment: Performed at [facility], [address] [place], [place] [address]  Glucose, capillary     Status: Abnormal   Collection Time: 06/08/20  8:24 AM  Result Value Ref Range    Glucose-Capillary 171 (H) 70 - 99  mg/dL    Comment: Glucose reference range applies only to samples taken after fasting for at least 8 hours.   Comment 1 Notify RN   Glucose, capillary     Status: Abnormal   Collection Time: 06/08/20 11:45 AM  Result Value Ref Range   Glucose-Capillary 177 (H) 70 - 99 mg/dL    Comment: Glucose reference range applies only to samples taken after fasting for at least 8 hours.   Comment 1 Notify RN   Glucose, capillary     Status: Abnormal   Collection Time: 06/08/20  4:39 PM  Result Value Ref Range   Glucose-Capillary 185 (H) 70 - 99 mg/dL    Comment: Glucose reference range applies only to samples taken after fasting for at least 8 hours.   Comment 1 Notify RN     Recent Results (from the past 240 hour(s))  SARS Coronavirus 2 by RT PCR (hospital order, performed in [facility] hospital lab) Nasopharyngeal Nasopharyngeal Swab     Status: None   Collection Time: 06/05/20  2:39 PM   Specimen: Nasopharyngeal Swab  Result Value Ref Range Status   SARS Coronavirus 2 NEGATIVE NEGATIVE Final    Comment: (NOTE) SARS-CoV-2 target nucleic acids are NOT DETECTED.  The SARS-CoV-2 RNA is generally detectable in upper and lower respiratory specimens during the acute phase of infection. The lowest concentration of SARS-CoV-2 viral copies this assay can detect is 250 copies / mL. A negative result does not preclude SARS-CoV-2 infection and should not be used as the sole basis for treatment or other patient management decisions.  A negative result may occur with improper specimen collection / handling, submission of specimen other than nasopharyngeal swab, presence of viral mutation(s) within the areas targeted by this assay, and inadequate number of viral copies (<250 copies / mL). A negative result must be combined with clinical observations, patient history, and epidemiological information.  Fact Sheet for Patients:    StrictlyIdeas.no  Fact Sheet for Healthcare Providers: BankingDealers.co.za  This test is not yet approved or  cleared by the [place] FDA and has been authorized for detection and/or diagnosis of SARS-CoV-2 by FDA under an Emergency Use Authorization (EUA).  This EUA will remain in effect (meaning this test can be used) for the duration of the COVID-19 declaration under Section 564(b)(1) of the Act, 21 U.S.C. section 360bbb-3(b)(1), unless the authorization is terminated or revoked sooner.  Performed at [facility], [address] [place],  [address]     Lipid Panel Recent Labs    06/06/20 0413  CHOL 170  TRIG 321*  HDL 29*  CHOLHDL 5.9  VLDL 64*  LDLCALC 77    Studies/Results: MR BRAIN W WO CONTRAST  Result Date: 06/08/2020 CLINICAL DATA:  69 year old male with slurred speech, right side weakness. Left paracentral pontine and right frontal horn white matter infarcts on MRI 3 days ago. Severe stenosis left MCA M2 branch on CTA 3 days ago. EXAM: MRI HEAD WITHOUT AND WITH CONTRAST TECHNIQUE: Multiplanar, multiecho pulse sequences of the brain and surrounding structures were obtained without and with intravenous contrast. CONTRAST:  37mL GADAVIST GADOBUTROL 1 MMOL/ML IV SOLN COMPARISON:  CT head and CTA head and neck 06/05/2020. Brain MRI 06/05/2020. FINDINGS: Brain: Progressive restricted diffusion in the left pons, involving a fairly extensive 2.6 cm area of the brainstem there (series 2, image 20). Associated T2 and FLAIR hyperintensity. No associated hemorrhage or mass effect. Stable to slightly faded restricted diffusion in the white matter adjacent  to the right frontal horn on series 2, image 30 today. No new areas of restriction. Stable gray and white matter signal elsewhere with extensive white matter T2 and FLAIR hyperintensity, occasional cystic encephalomalacia of the white matter (left corona radiata  series 9, image 18). No cortical encephalomalacia identified. Chronic lacunar infarcts in the bilateral thalami and right basal ganglia. Right thalamic chronic microhemorrhage. No midline shift, mass effect, evidence of mass lesion, ventriculomegaly, extra-axial collection or acute intracranial hemorrhage. Cervicomedullary junction and pituitary are within normal limits. No abnormal enhancement identified. No dural thickening. Vascular: Major intracranial vascular flow voids are stable. Skull and upper cervical spine: Negative visible cervical spine, bone marrow signal. Sinuses/Orbits: Stable, negative. Other: Mastoids remain clear. Grossly normal visible internal auditory structures. Scalp and face appear negative. IMPRESSION: 1. Moderate size left pontine infarct with increased diffusion restriction compared to 3 days ago, but no associated hemorrhage or mass effect. 2. Stable to perhaps slightly faded acute white matter lacunar infarct adjacent to the right frontal lobe. 3. No new intracranial abnormality. Underlying advanced chronic small vessel disease. Electronically Signed   By: Genevie Ann M.D.   On: 06/08/2020 14:15    Medications:  Scheduled: .  stroke: mapping our early stages of recovery book   Does not apply Once  . allopurinol  300 mg Oral Daily  . [START ON 06/09/2020] amLODipine  10 mg Oral Daily  . aspirin  300 mg Rectal Daily   Or  . aspirin EC  325 mg Oral Daily  . atorvastatin  80 mg Oral Daily  . cholecalciferol  1,000 Units Oral Daily  . finasteride  5 mg Oral Daily  . hydrocortisone cream   Topical Q M,W,F  . insulin aspart  0-20 Units Subcutaneous TID AC & HS  . losartan  100 mg Oral Daily  . Ensure Max Protein  11 oz Oral BID BM  . sodium chloride flush  3 mL Intravenous Once    Assessment:69 y.o.malepresenting with new onset of right sided weakness. Symptoms were first noted 2 days prior to presentation. Today (Thursday) his right sided weakness was noted to have  worsened. Repeat MRI brain revealed more prominent DWI signal in the left paramedian pons, more prominent T2-weighted signal in the same region and one new linear focus of DWI abnormality at the midline. 1. Exam today reveals worsened facial droop and right sided weakness.   2. Overall clinical and imaging findings are most consistent with increased edema and one small linear region of stroke extension in the bed of the subacute left paramedian pontine ischemic infarction. No indication for steroids or anticoagulation. Will continue ASA. Has a GI bleed and cannot use Plavix.      3. CTA of head and neck: No large vessel occlusion. Intracranial atherosclerosis including severe stenoses of a left M2 branch vessel and of the median artery of the corpus callosum. Strongly dominant right vertebral artery with possible moderate proximal V1 stenosis. Hypoplastic left vertebral artery with a severe distal V4 stenosis. Widely patent cervical carotid arteries. 4. EKG (9/6): Normal sinus rhythm. Prolonged QT.  5.Stroke Risk Factors -Cancer history, DM, dyslipidemia, HTN and obesity 6. HgbA1c elevated at 7.5 7. Abnormal lipid profile 8. Recent lower GIB with BRBPR. Unknown bleeding source. Hgb on admission was significantly lower than the value from 7 days prior (14.2 >> 11.9) but is relatively stable today (11.7) relative to the admission value. The patient's stool yesterday did contain some blood. No BM today (9/9).  9. TTE: No PFO or mural thrombus is described in the report. LVEF is  >55%.   Recommendations: 1. BP management. Out of the permissive HTN time window 2. Will need TEE and Zio patch. Discussed with patient, his wife and Hospitalist  3. PT consult, OT consult, Speech consult 4. Frequent neuro checks 5. Continue ASA at 325 mg po qd. Due to severe intracranial atherosclerosis at some locations on CTA, would consider adding Plavix after source of his lower GIB is found and treated. Due to GIB  source not being known and drop in Hgb of 4 points in one week, the risk for a catastrophic GIB, although relatively low, is present and the risks of Plavix administration without knowing the source is felt to significantly outweigh the benefits. Discussed with patient and wife who expressed understanding and agreement with the plan.  6.Continue atorvastatin  7. Telemetry monitoring 8. Repeat MRI reviewed and discussed with the patient and his wife.  9. Ordering a thromboxane B2 platelet aggregation assay.from [facility] to assess for ASA efficacy in this patient. The turnaround time is 4-9 days.  10. Neurology service will continue to follow with you.       LOS: 3 days   @Electronically  signed: Dr. Kerney Elbe 06/08/2020  5:45 PM

## 2020-06-08 NOTE — Progress Notes (Signed)
Physical Therapy Treatment Patient Details Name: Peter Ryan MRN: [id number] DOB: 12-Aug-1951 Today's Date: 06/08/2020    History of Present Illness Peter Ryan is a 69 y.o. male with medical history significant for obesity, BPH, hypertension, diabetes mellitus, dyslipidemia and non-Hodgkin's lymphoma status post chemotherapy currently under surveillance who presented to the emergency room for evaluation of right-sided weakness that was progressing  prior to arrival.  Prior to admit he had a fall and needed EMS to assist patient back to bed.  He has not been able to ambulate since his fall and by all accounts signs and symptoms have progressively gotten worse since. His wife also states that his speech was slurred. Imaging reveals R pontine and L frontal infarcts.     PT Comments    Pt with increased slurred speech and facial droop, essentially flaccid R UE and decreased strength and motor control in the LE as compared to 2 days ago when this PT saw him for eval.  Pt had rapid response called earlier today, cleared for PT with discussion with nurse.  Pt had follow up MRI this afternoon that did not appear to show much significant change/evolution intracranially.  Pt somewhat lethargic (likely from Ativan), but eager and able to participate with PT/exercises.  Deferred mobility secondary to safety concerns, pt likely a +3 at this point for safe transfers.  Pt showed some minimal strength with elbow flx/ext but essentially no muscle control/contraction in shoulder or wrist/hand ext and very minimal with flexion.  He did do better with R LE A/AAROM but generally is weaker and has less control/coordination than previous session.  Nuerology in room to see pt at end of session.  Educated pt and wife about PT, future care, expectations and ways to facilitate good positioning and exercises/activity moving forward.     Follow Up Recommendations  CIR;SNF;Supervision/Assistance - 24 hour (per progress)      Equipment Recommendations  Other (comment) (TBD at next venue of care)    Recommendations for Other Services       Precautions / Restrictions Precautions Precautions: Fall Restrictions Weight Bearing Restrictions: No    Mobility  Bed Mobility               General bed mobility comments: deferred secondary to lethargy, rapid response called earlier today and change in functional status  Transfers                    Ambulation/Gait                 Stairs             Wheelchair Mobility    Modified Rankin (Stroke Patients Only)       Balance                                            Cognition Arousal/Alertness: Suspect due to medications;Lethargic Behavior During Therapy: WFL for tasks assessed/performed Overall Cognitive Status: Within Functional Limits for tasks assessed                                 General Comments: had ativan for MRI, still somewhat lethargic      Exercises General Exercises - Upper Extremity Shoulder Flexion: PROM;AAROM;10 reps;Right (PNF pattern 1 and 2) Shoulder Extension: AAROM;10 reps;PROM (PNF pattern  1 and 2) Elbow Flexion: AROM;AAROM;10 reps;Right Elbow Extension: AROM;AAROM;Right;10 reps Composite Extension: PROM;AAROM;10 reps General Exercises - Lower Extremity Ankle Circles/Pumps: AAROM;PROM;10 reps;Right (able to give some PF push, limited DF ) Quad Sets: AROM;Strengthening;Right;10 reps Short Arc Quad: AROM;AAROM;10 reps;Right Heel Slides: AAROM;AROM;10 reps;Right (with resisted leg extensions) Hip ABduction/ADduction: 10 reps;AROM;Right (also hip IR/ER AAROM X 10 with hip/knees at ~70*)    General Comments General comments (skin integrity, edema, etc.): Pt with marked difference in R facial droop, slurred speech, R UE flaccidity and to a lesser degree weaker in the L LE       Pertinent Vitals/Pain Pain Assessment: No/denies pain    Home Living                       Prior Function            PT Goals (current goals can now be found in the care plan section) Progress towards PT goals: Not progressing toward goals - comment (devolution of R sided symptoms)    Frequency    7X/week      PT Plan Current plan remains appropriate    Co-evaluation              AM-PAC PT "6 Clicks" Mobility   Outcome Measure  Help needed turning from your back to your side while in a flat bed without using bedrails?: A Lot Help needed moving from lying on your back to sitting on the side of a flat bed without using bedrails?: A Lot Help needed moving to and from a bed to a chair (including a wheelchair)?: Total Help needed standing up from a chair using your arms (e.g., wheelchair or bedside chair)?: Total Help needed to walk in hospital room?: Total Help needed climbing 3-5 steps with a railing? : Total 6 Click Score: 8    End of Session Equipment Utilized During Treatment: Gait belt Activity Tolerance: Patient tolerated treatment well Patient left: with bed alarm set;with call bell/phone within reach;with family/visitor present Nurse Communication: Mobility status PT Visit Diagnosis: Muscle weakness (generalized) (M62.81);Difficulty in walking, not elsewhere classified (R26.2);Other symptoms and signs involving the nervous system (R29.898);Hemiplegia and hemiparesis Hemiplegia - Right/Left: Right Hemiplegia - dominant/non-dominant: Dominant Hemiplegia - caused by: Cerebral infarction     Time: 8325-4982 PT Time Calculation (min) (ACUTE ONLY): 43 min  Charges:  $Therapeutic Exercise: 23-37 mins $Therapeutic Activity: 8-22 mins                     Kreg Shropshire, DPT 06/08/2020, 5:36 PM

## 2020-06-08 NOTE — Progress Notes (Signed)
Received report from Chris, RN. Assuming care of patient at this time.  

## 2020-06-08 NOTE — Progress Notes (Signed)
Inpatient Rehab Admissions Coordinator:   I have no beds available for this patient to admit to CIR today.  Will continue to follow for timing of potential admission pending bed availability.    Jordy Verba, PT, DPT Admissions Coordinator [phone] 06/08/20  9:42 AM   

## 2020-06-08 NOTE — Progress Notes (Signed)
[place] paged to rm. for rapid response; when Peter Ryan arrived, pt. awake, sitting up in bed attended by medical team; pt. greeted Peter Ryan.  Pt.'s wife Peter Ryan standing out of the way in corner; [facility] spoke w/her and learned that pt. came to [facility] earlier this week for stroke symptoms; pt. scheduled to go to rehab @ [facility] as soon as bed is available; this AM, pt.'s arm significantly weaker, wife said; pt. to be taken to MRI for scan.  Per wife's request, [place] prayed for pt.'s scan and further recovery and for strength for wife.  [facility] remains available as needed.

## 2020-06-08 NOTE — Progress Notes (Signed)
PROGRESS NOTE    Peter Ryan  SWF:[id number] DOB: [birth year] DOA: 06/05/2020 PCP: [name]., MD  Brief Narrative:  Peter Ryan is a 69 y.o. male with medical history significant forobesity, BPH, hypertension, diabetes mellitus, dyslipidemia and non-Hodgkin's lymphoma status post chemotherapy currently under surveillance.  He presented to the hospital because of right-sided weakness that started on the day prior to admission.  He still felt weak in his right leg the following day and he fell on the floor when he tried to get up to use the bathroom.  His wife also noticed slurred speech.  MRI brain showed acute left pontine and right frontal white matter infarcts.  He was on low-dose aspirin at home and neurologist has recommended aspirin be increased to 325 mg daily.  9/8: Patient seen and examined.  Wife at bedside.  Complains of weakness.  9/9: Patient seen and examined.  Wife remains at bedside.  Lengthy conversation about slow GI bleed.  Hemoglobin has been stable.  At this time we elect to defer GI evaluation outpatient.  Patient and wife were given clear instructions regarding need for urgent medical attention should bleed acutely worsen.  Approximately 1230 received a call from bedside RN with concerns of patient had worsening neurologic exam and deficits.  Worsening right-sided facial droop.  Decreased grip strength on right.  Discussed with on-call neurologist who recommended stat MRI brain.  Ordered.  Currently pending   Assessment & Plan:   Principal Problem:   Acute CVA (cerebrovascular accident) [facility]) Active Problems:   Type 2 diabetes mellitus ([place])   Benign essential hypertension   Obesity, Class III, BMI 40-49.9 (morbid obesity) ([place])  Acute CVA, left pontine acute infarction 2 days prior to admission Not a TPA candidate given last known time well MRI confirmed infarction CTA head and neck: No LVO EKG normal sinus rhythm Echocardiogram  reassuring 9/9: Worsening neurologic symptoms noted.  Repeat stat MRI brain ordered Plan: Follow-up repeat MRI BP management, home medications restarted Continue aspirin 325 mg p.o. daily Continue Lipitor PT and OT as tolerated Frequent neuro checks SLP evaluation Patient could be a good candidate for dual antiplatelet therapy can considering severe intracranial atherosclerosis.  Will defer this addition for now.  Patient is currently undergoing outpatient work-up for suspected GI bleed.  I recommend that the patient go back and see the gastroenterologist to achieve resolution on GI bleed.  Once this is achieved outpatient neurology can evaluate for need regarding dual antiplatelet therapy  Lower GI bleed Patient has had intermittent BRBPR Hemoglobin has been stable Rectal bleeding has been intermittent and small-volume Could be hemorrhoidal versus possibly diverticular Lengthy conversation with the patient and wife today at bedside.  Explained that endoscopic evaluation likely involve a colonoscopy and associated bowel prep.  I offered to reach out to the gastroenterologist here.  At this time patient and wife are electing to defer this procedure to outpatient.  I feel this is acceptable choice.  Patient's hemoglobin has been relatively stable and given his recent infarct he is significantly debilitated.  Clear return to ED instructions have been provided should patient's bleed acutely worsen.   Hypertension Resume amlodipine, home dose 10 mg Resume losartan 100 mg nightly  Type 2 diabetes mellitus On sliding scale insulin before meals and at bedtime Can resume home regimen at discharge  Morbid obesity Dietitian consult  Gout Continue allopurinol  DVT prophylaxis: SCD Code Status: Full Family Communication: Wife at bedside Disposition Plan: Status is: Inpatient  Remains inpatient  appropriate because:Inpatient level of care appropriate due to severity of illness   Dispo: The  patient is from: Home              Anticipated d/c is to: CIR              Anticipated d/c date is: 1 day              Patient currently is not medically stable to d/c.   Patient was awaiting CRI bed.  Patient has slightly worse neurologic exam between yesterday and today.  Pursuing work-up with a repeat MRI brain.  Disposition plan pending results.   Consultants:   Neurology  Procedures:   None  Antimicrobials:   None   Subjective: Patient seen and examined.  Continues to endorse fatigue  Objective: Vitals:   06/07/20 1610 06/07/20 2341 06/08/20 0823 06/08/20 1310  BP: 137/63 (!) 141/68 140/65 (!) 162/73  Pulse: 82 76 73 88  Resp: 16  16   Temp: 98.4 F (36.9 C) 97.8 F (36.6 C) 98 F (36.7 C)   TempSrc: Axillary Oral Oral   SpO2: 99% 99% 97% 97%  Weight:      Height:        Intake/Output Summary (Last 24 hours) at 06/08/2020 1345 Last data filed at 06/08/2020 1044 Gross per 24 hour  Intake 240 ml  Output 600 ml  Net -360 ml   Filed Weights   06/05/20 1051  Weight: 129.1 kg    Examination:  General: No apparent distress, patient appears well HEENT: Normocephalic, atraumatic Neck, supple, trachea midline, no tenderness Heart: Regular rate and rhythm, S1/S2 normal, no murmurs Lungs: Clear to auscultation bilaterally, no adventitious sounds, normal work of breathing Abdomen: Obese, soft, nontender, nondistended, positive bowel sounds  extremities: Normal, atraumatic, no clubbing or cyanosis, normal muscle tone Skin: No rashes or lesions, normal color Neurologic: Right upper and lower extremity weakness.  Right-sided facial droop.  Decreased grip strength on right upper extremity  psychiatric: Normal affect     Data Reviewed: I have personally reviewed following labs and imaging studies  CBC: Recent Labs  Lab 06/05/20 1100 06/07/20 1716 06/08/20 0507  WBC 11.4*  --  7.2  NEUTROABS 8.9*  --  4.4  HGB 11.9* 12.1* 11.7*  HCT 34.0*  --  32.8*  MCV  85.2  --  83.9  PLT 254  --  409   Basic Metabolic Panel: Recent Labs  Lab 06/05/20 1100  NA 140  K 3.4*  CL 103  CO2 22  GLUCOSE 252*  BUN 19  CREATININE 0.81  CALCIUM 8.6*   GFR: Estimated Creatinine Clearance: 119.4 mL/min (by C-G formula based on SCr of 0.81 mg/dL). Liver Function Tests: Recent Labs  Lab 06/05/20 1100  AST 27  ALT 21  ALKPHOS 56  BILITOT 0.7  PROT 7.2  ALBUMIN 3.9   No results for input(s): LIPASE, AMYLASE in the last 168 hours. No results for input(s): AMMONIA in the last 168 hours. Coagulation Profile: Recent Labs  Lab 06/05/20 1100  INR 1.0   Cardiac Enzymes: No results for input(s): CKTOTAL, CKMB, CKMBINDEX, TROPONINI in the last 168 hours. BNP (last 3 results) No results for input(s): PROBNP in the last 8760 hours. HbA1C: Recent Labs    06/06/20 0413  HGBA1C 7.5*   CBG: Recent Labs  Lab 06/07/20 2057 06/07/20 2350 06/08/20 0351 06/08/20 0824 06/08/20 1145  GLUCAP 175* 147* 156* 171* 177*   Lipid Profile: Recent Labs  06/06/20 0413  CHOL 170  HDL 29*  LDLCALC 77  TRIG 321*  CHOLHDL 5.9   Thyroid Function Tests: No results for input(s): TSH, T4TOTAL, FREET4, T3FREE, THYROIDAB in the last 72 hours. Anemia Panel: No results for input(s): VITAMINB12, FOLATE, FERRITIN, TIBC, IRON, RETICCTPCT in the last 72 hours. Sepsis Labs: No results for input(s): PROCALCITON, LATICACIDVEN in the last 168 hours.  Recent Results (from the past 240 hour(s))  SARS Coronavirus 2 by RT PCR (hospital order, performed in [facility] hospital lab) Nasopharyngeal Nasopharyngeal Swab     Status: None   Collection Time: 06/05/20  2:39 PM   Specimen: Nasopharyngeal Swab  Result Value Ref Range Status   SARS Coronavirus 2 NEGATIVE NEGATIVE Final    Comment: (NOTE) SARS-CoV-2 target nucleic acids are NOT DETECTED.  The SARS-CoV-2 RNA is generally detectable in upper and lower respiratory specimens during the acute phase of infection. The  lowest concentration of SARS-CoV-2 viral copies this assay can detect is 250 copies / mL. A negative result does not preclude SARS-CoV-2 infection and should not be used as the sole basis for treatment or other patient management decisions.  A negative result may occur with improper specimen collection / handling, submission of specimen other than nasopharyngeal swab, presence of viral mutation(s) within the areas targeted by this assay, and inadequate number of viral copies (<250 copies / mL). A negative result must be combined with clinical observations, patient history, and epidemiological information.  Fact Sheet for Patients:   StrictlyIdeas.no  Fact Sheet for Healthcare Providers: BankingDealers.co.za  This test is not yet approved or  cleared by the [place] FDA and has been authorized for detection and/or diagnosis of SARS-CoV-2 by FDA under an Emergency Use Authorization (EUA).  This EUA will remain in effect (meaning this test can be used) for the duration of the COVID-19 declaration under Section 564(b)(1) of the Act, 21 U.S.C. section 360bbb-3(b)(1), unless the authorization is terminated or revoked sooner.  Performed at [facility], [address], [place], [place] [address]          Radiology Studies: No results found.      Scheduled Meds: .  stroke: mapping our early stages of recovery book   Does not apply Once  . allopurinol  300 mg Oral Daily  . amLODipine  5 mg Oral Daily  . aspirin  300 mg Rectal Daily   Or  . aspirin EC  325 mg Oral Daily  . atorvastatin  80 mg Oral Daily  . cholecalciferol  1,000 Units Oral Daily  . finasteride  5 mg Oral Daily  . hydrocortisone cream   Topical Q M,W,F  . insulin aspart  0-20 Units Subcutaneous TID AC & HS  . losartan  100 mg Oral Daily  . Ensure Max Protein  11 oz Oral BID BM  . sodium chloride flush  3 mL Intravenous Once   Continuous  Infusions:   LOS: 3 days    Time spent: 25 minutes    Sidney Ace, MD [facility] Pager [phone]  If 7PM-7AM, please contact night-coverage 06/08/2020, 1:45 PM

## 2020-06-08 NOTE — Plan of Care (Signed)
  Problem: Education: Goal: Knowledge of disease or condition will improve Outcome: Progressing Goal: Knowledge of secondary prevention will improve Outcome: Not Progressing Goal: Knowledge of patient specific risk factors addressed and post discharge goals established will improve Outcome: Not Progressing   Problem: Coping: Goal: Will verbalize positive feelings about self Outcome: Progressing Goal: Will identify appropriate support needs Outcome: Progressing   Problem: Health Behavior/Discharge Planning: Goal: Ability to manage health-related needs will improve Outcome: Not Progressing   Problem: Self-Care: Goal: Ability to participate in self-care as condition permits will improve Outcome: Not Progressing Goal: Verbalization of feelings and concerns over difficulty with self-care will improve Outcome: Progressing Goal: Ability to communicate needs accurately will improve Outcome: Not Progressing   Problem: Nutrition: Goal: Risk of aspiration will decrease Outcome: Not Progressing   Problem: Ischemic Stroke/TIA Tissue Perfusion: Goal: Complications of ischemic stroke/TIA will be minimized Outcome: Not Progressing  Pt r sided deficits more pronounced today - new MRI completed for f/u with non change in nursing poc at this time - per MD neuro will order TEE for additional f/u

## 2020-06-09 ENCOUNTER — Ambulatory Visit: Payer: Medicare Other

## 2020-06-09 ENCOUNTER — Encounter
Admission: EM | Disposition: A | Discharge status: Rehabilitation Facility | Payer: Self-pay | Source: Home / Self Care | Attending: Internal Medicine

## 2020-06-09 ENCOUNTER — Telehealth: Payer: Self-pay | Admitting: Cardiovascular Disease

## 2020-06-09 ENCOUNTER — Inpatient Hospital Stay (HOSPITAL_COMMUNITY)
Admit: 2020-06-09 | Discharge: 2020-06-09 | Disposition: A | Discharge status: Home / Self Care | Payer: Medicare Other | Attending: Cardiovascular Disease | Admitting: Cardiovascular Disease

## 2020-06-09 DIAGNOSIS — I6389 Other cerebral infarction: Secondary | ICD-10-CM

## 2020-06-09 HISTORY — PX: TEE WITHOUT CARDIOVERSION: SHX5443

## 2020-06-09 LAB — CBC WITH DIFFERENTIAL/PLATELET
Abs Immature Granulocytes: 0.03 10*3/uL (ref 0.00–0.07)
Basophils Absolute: 0.1 10*3/uL (ref 0.0–0.1)
Basophils Relative: 1 %
Eosinophils Absolute: 0.3 10*3/uL (ref 0.0–0.5)
Eosinophils Relative: 3 %
HCT: 35.2 % — ABNORMAL LOW (ref 39.0–52.0)
Hemoglobin: 12 g/dL — ABNORMAL LOW (ref 13.0–17.0)
Immature Granulocytes: 0 %
Lymphocytes Relative: 27 %
Lymphs Abs: 2.4 10*3/uL (ref 0.7–4.0)
MCH: 29.9 pg (ref 26.0–34.0)
MCHC: 34.1 g/dL (ref 30.0–36.0)
MCV: 87.8 fL (ref 80.0–100.0)
Monocytes Absolute: 0.8 10*3/uL (ref 0.1–1.0)
Monocytes Relative: 9 %
Neutro Abs: 5.2 10*3/uL (ref 1.7–7.7)
Neutrophils Relative %: 60 %
Platelets: 246 10*3/uL (ref 150–400)
RBC: 4.01 MIL/uL — ABNORMAL LOW (ref 4.22–5.81)
RDW: 14.4 % (ref 11.5–15.5)
WBC: 8.8 10*3/uL (ref 4.0–10.5)
nRBC: 0 % (ref 0.0–0.2)

## 2020-06-09 LAB — GLUCOSE, CAPILLARY
Glucose-Capillary: 176 mg/dL — ABNORMAL HIGH (ref 70–99)
Glucose-Capillary: 205 mg/dL — ABNORMAL HIGH (ref 70–99)
Glucose-Capillary: 152 mg/dL — ABNORMAL HIGH (ref 70–99)

## 2020-06-09 SURGERY — ECHOCARDIOGRAM, TRANSESOPHAGEAL
Anesthesia: Moderate Sedation

## 2020-06-09 MED ORDER — LIDOCAINE VISCOUS HCL 2 % MT SOLN
OROMUCOSAL | Status: AC | PRN
  Administered 2020-06-09: 15 mL via OROMUCOSAL

## 2020-06-09 MED ORDER — MIDAZOLAM HCL 5 MG/5ML IJ SOLN
INTRAMUSCULAR | Status: AC
  Filled 2020-06-09: qty 5

## 2020-06-09 MED ORDER — BUTAMBEN-TETRACAINE-BENZOCAINE 2-2-14 % EX AERO
INHALATION_SPRAY | CUTANEOUS | Status: AC
  Administered 2020-06-09: 3
  Filled 2020-06-09: qty 5

## 2020-06-09 MED ORDER — LIDOCAINE VISCOUS HCL 2 % MT SOLN
OROMUCOSAL | Status: AC
  Filled 2020-06-09: qty 15

## 2020-06-09 MED ORDER — FENTANYL CITRATE (PF) 100 MCG/2ML IJ SOLN
INTRAMUSCULAR | Status: AC | PRN
  Administered 2020-06-09 (×3): 25 ug via INTRAVENOUS

## 2020-06-09 MED ORDER — FENTANYL CITRATE (PF) 100 MCG/2ML IJ SOLN
INTRAMUSCULAR | Status: AC
  Filled 2020-06-09: qty 2

## 2020-06-09 MED ORDER — SODIUM CHLORIDE 0.9 % IV SOLN: INTRAVENOUS | Status: DC

## 2020-06-09 MED ORDER — MIDAZOLAM HCL 2 MG/2ML IJ SOLN
INTRAMUSCULAR | Status: AC | PRN
  Administered 2020-06-09 (×3): 1 mg via INTRAVENOUS

## 2020-06-09 MED ORDER — SODIUM CHLORIDE 0.9 % IV SOLN
INTRAVENOUS | Status: AC | PRN
  Administered 2020-06-09: 50 mL/h via INTRAVENOUS

## 2020-06-09 MED ORDER — SODIUM CHLORIDE FLUSH 0.9 % IV SOLN
INTRAVENOUS | Status: AC
  Filled 2020-06-09: qty 10

## 2020-06-09 NOTE — Progress Notes (Signed)
Transesophageal Echocardiogram :  Indication: CVA Requesting/ordering  physician:  Dr. Priscella Mann Procedure: Benzocaine spray x2 and 2 mls x 2 of viscous lidocaine were given orally to provide local anesthesia to the oropharynx. The patient was positioned supine on the left side, bite block provided. The patient was moderately sedated with the doses of versed and fentanyl as detailed below.  Using digital technique an omniplane probe was advanced into the distal esophagus without incident.   Moderate sedation: 1. Sedation used:  Versed: 3 mg, Fentanyl: 75 ug 2. Time administered: 10:45 AM    Time when patient started recovery:11:20 Total sedation time 35 min 3. I was face to face during this time  See report in EPIC  for complete details: In brief,  No left atrial or left atrial appendage thrombus No PFO or ASD, saline contrast bubble study negative  transgastric imaging revealed normal LV function with no RWMAs and no mural apical thrombus.  .  Estimated ejection fraction was 50 to 55%.  Right sided cardiac chambers were normal with no evidence of pulmonary hypertension.  Imaging of the septum showed no ASD or VSD Bubble study was negative for shunt 2D and color flow confirmed no PFO  The LA was well visualized in orthogonal views.  There was no spontaneous contrast and no thrombus in the LA and LA appendage   The descending thoracic aorta had no significant aortic atherosclerosis, there was mild mural aortic debris in the arch with no evidence of aneurysmal dilation or disection   Ida Rogue 06/09/2020 12:40 PM

## 2020-06-09 NOTE — Telephone Encounter (Signed)
Attempted to schedule.  LMOV

## 2020-06-09 NOTE — Progress Notes (Signed)
*  PRELIMINARY RESULTS* Echocardiogram Echocardiogram Transesophageal has been performed.  Sherrie Sport 06/09/2020, 11:06 AM

## 2020-06-09 NOTE — Progress Notes (Signed)
Physical Therapy Treatment Patient Details Name: Peter Ryan MRN: [id number] DOB: [birth year] Today's Date: 06/09/2020    History of Present Illness Peter Ryan is a 69 y.o. male with medical history significant for obesity, BPH, hypertension, diabetes mellitus, dyslipidemia and non-Hodgkin's lymphoma status post chemotherapy currently under surveillance who presented to the emergency room for evaluation of right-sided weakness that was progressing  prior to arrival.  Prior to admit he had a fall and needed EMS to assist patient back to bed.  He has not been able to ambulate since his fall and by all accounts signs and symptoms have progressively gotten worse since. His wife also states that his speech was slurred. Imaging reveals R pontine and L frontal infarcts.     PT Comments    Pt with change in physical abilities today requiring increased assistance. Pt required max+2 A for supine <> sit for truncal and BLE management. Pt with poor to fair static sitting balance and noted to have difficulty maintaining upright and midline orientation. Pt with L and L posterolateral leaning and required max verbal cues for orientation to midline. Pt required max A for balance and able to maintain static sitting balance with close SBA for very short bouts of time. Verbal and tactile cues for improved sitting posture with fair carryover, as pt with forward rounded shoulders and slumped posture. Heavy max+2 A for sit <> stand for boost into standing, balance to remain upright, and eccentric control on descent. Pt stood to RW. R knee blocked and pt unable to fully extend L knee. Pt remained flexed at trunk and unable to come into full upright stance despite heavy verbal and tactile cues for glute, trunk extensor, and tricep activation. Pt's R hand required manual placement on RW. Pt fatigued and with poor eccentric control when sitting to bed. Once returned to supine, pt able to utilize LUE and LLE to slide up for  better positioning in bed with max A for R sided assistance. Pt performed therex for promotion of muscle activation, muscle strengthening, and improved awareness of RUE/RLE.Overall, pt with decreased muscle control and spatial awareness today while RUE and RLE presented with increased weakness. Pt at times exhibited poor awareness of deficits and their physical carryover. Will continue to follow and progress as tolerated.   Follow Up Recommendations  CIR;SNF;Supervision/Assistance - 24 hour;Other (comment) (per progress)     Equipment Recommendations  Other (comment) (TBD at next venue of care)    Recommendations for Other Services       Precautions / Restrictions Precautions Precautions: Fall Restrictions Weight Bearing Restrictions: No Other Position/Activity Restrictions: protect weak RUE    Mobility  Bed Mobility Overal bed mobility: Needs Assistance Bed Mobility: Supine to Sit;Sit to Supine     Supine to sit: Max assist;+2 for physical assistance Sit to supine: Max assist;+2 for physical assistance   General bed mobility comments: required heavy max+2 A for truncal elevation and BLE control over and off of bed and for trunk/BLE management onto bed  Transfers Overall transfer level: Needs assistance Equipment used: Rolling walker (2 wheeled) Transfers: Sit to/from Stand Sit to Stand: Max assist;+2 physical assistance         General transfer comment: heavy max+2 A for boosting to standing, balance while upright, and for eccentric control on descent; pt stood to RW and had difficulty grasping RW with R hand  Ambulation/Gait             General Gait Details: not attempted; unsafe at this  time   Marine scientist [place] (Stroke Patients Only)       Balance Overall balance assessment: Needs assistance Sitting-balance support: Single extremity supported;Feet supported (RUE placed in dependent position to attempt  WBing) Sitting balance-Leahy Scale: Poor Sitting balance - Comments: pt with left and L posterolateral lean with static sitting requiring mod-max A to correct; pt with poor-fair midline orientation noted Postural control: Posterior lean;Left lateral lean Standing balance support: Bilateral upper extremity supported Standing balance-Leahy Scale: Poor Standing balance comment: max+2 A for upright balance during dynamic activity with pt unable to attain full upright stance                            Cognition Arousal/Alertness: Suspect due to medications;Lethargic Behavior During Therapy: WFL for tasks assessed/performed;Flat affect Overall Cognitive Status: Within Functional Limits for tasks assessed                                 General Comments: wife states he is still a bit sleepy/groggy after having had procedure (TEE) earlier today      Exercises Other Exercises Other Exercises: pt performed shoulder shrugs x 10 with noted RUE deficit; pt with compensatory movements noted Other Exercises: in semi supine position: LLE resistive leg presses, clamshells, hip abd/add x 10 reps, 2 sets, max A for R hip abduction; RLE resistive leg presses x 10 reps, 2 sets; bilat APs x 10 reps    General Comments General comments (skin integrity, edema, etc.): R facial droop more noticeable with slurred speech still present      Pertinent Vitals/Pain Pain Assessment: No/denies pain    Home Living                      Prior Function            PT Goals (current goals can now be found in the care plan section) Acute Rehab PT Goals Patient Stated Goal: get back to walking and being able to do for himself PT Goal Formulation: With patient/family Time For Goal Achievement: 06/20/20 Potential to Achieve Goals: Fair Progress towards PT goals: Progressing toward goals    Frequency    7X/week      PT Plan Current plan remains appropriate     Co-evaluation              AM-PAC PT "6 Clicks" Mobility   Outcome Measure  Help needed turning from your back to your side while in a flat bed without using bedrails?: A Lot Help needed moving from lying on your back to sitting on the side of a flat bed without using bedrails?: A Lot Help needed moving to and from a bed to a chair (including a wheelchair)?: Total Help needed standing up from a chair using your arms (e.g., wheelchair or bedside chair)?: Total Help needed to walk in hospital room?: Total Help needed climbing 3-5 steps with a railing? : Total 6 Click Score: 8    End of Session Equipment Utilized During Treatment: Gait belt Activity Tolerance: Patient tolerated treatment well Patient left: in bed;with call bell/phone within reach;with bed alarm set;with family/visitor present;with SCD's reapplied Nurse Communication: Mobility status PT Visit Diagnosis: Muscle weakness (generalized) (M62.81);Difficulty in walking, not elsewhere classified (R26.2);Other symptoms and signs involving the  nervous system (R29.898);Hemiplegia and hemiparesis Hemiplegia - Right/Left: Right Hemiplegia - dominant/non-dominant: Dominant Hemiplegia - caused by: Cerebral infarction     Time: 4765-4650 PT Time Calculation (min) (ACUTE ONLY): 45 min  Charges:                       Vale Haven, SPT   Vale Haven 06/09/2020, 3:51 PM

## 2020-06-09 NOTE — Telephone Encounter (Signed)
-----   Message from Minna Merritts, MD sent at 06/09/2020 12:43 PM EDT ----- Regarding: reveal Scheduling, can we see if Dr. Quentin Ore  might be available for a virtual visit with this patient and his wife.  He has some right-sided deficit and hard to get around. Has had several strokes, I did TEE today in the hospital with no acute findings Possible atrial fibrillation as cause of his embolic strokes I did talk with wife about reveal device She would like to learn more about it before placement Perhaps a quick virtual visit with EP to discuss then arrangements could be made for procedure either [place] or [place].  I think he may live locally Thx tg

## 2020-06-09 NOTE — Progress Notes (Signed)
Subjective: Feels somewhat stronger than yesterday. Going for TEE this AM.   Objective: Current vital signs: BP (!) 147/74 (BP Location: Left Arm)   Pulse 68   Temp 98.2 F (36.8 C) (Oral)   Resp 15   Ht 5\' 10"  (1.778 m)   Wt 129.1 kg   SpO2 93%   BMI 40.84 kg/m  Vital signs in last 24 hours: Temp:  [98 F (36.7 C)-98.5 F (36.9 C)] 98.2 F (36.8 C) (09/10 0743) Pulse Rate:  [68-88] 68 (09/10 0743) Resp:  [15-18] 15 (09/10 0743) BP: (133-162)/(65-74) 147/74 (09/10 0743) SpO2:  [93 %-97 %] 93 % (09/10 0743)  Intake/Output from previous day: 09/09 0701 - 09/10 0700 In: 720 [P.O.:720] Out: 600 [Urine:600] Intake/Output this shift: No intake/output data recorded. Nutritional status:  Diet Order            Diet NPO time specified  Diet effective midnight                HEENT: [place]/AT Lungs: Respirations unlabored Ext: Chronic pigmentary changes to distal BLE  Neurologic Exam: Ment: Awake, alert and oriented. Brighter affect today. Able to follow all commands. Speech is fluent. Mild dysarthria. Able to answer complex questions.  CN: PERRL. EOMI without nystagmus. Right facial droop is improved since yesterday. Motor:  LUE and LLE 5/5 RUE 3/5 deltoid, biceps, triceps. 3/5 grip strength RLE 3/5 hip flexion and knee extension  Sensory: Intact to FT x 4 without asymmetry Reflexes: Unchanged.  Cerebellar: Left FNF normal. Slow, ataxic FNF on the right.   Lab Results: Results for orders placed or performed during the hospital encounter of 06/05/20 (from the past 48 hour(s))  Glucose, capillary     Status: Abnormal   Collection Time: 06/07/20  8:03 AM  Result Value Ref Range   Glucose-Capillary 149 (H) 70 - 99 mg/dL    Comment: Glucose reference range applies only to samples taken after fasting for at least 8 hours.   Comment 1 Notify RN    Comment 2 Document in Chart   Glucose, capillary     Status: Abnormal   Collection Time: 06/07/20 11:31 AM  Result Value Ref  Range   Glucose-Capillary 162 (H) 70 - 99 mg/dL    Comment: Glucose reference range applies only to samples taken after fasting for at least 8 hours.   Comment 1 Notify RN    Comment 2 Document in Chart   Occult blood card to lab, stool     Status: Abnormal   Collection Time: 06/07/20  5:04 PM  Result Value Ref Range   Fecal Occult Bld POSITIVE (A) NEGATIVE    Comment: Performed at [facility], [address] [place], [place] [address]  Hemoglobin     Status: Abnormal   Collection Time: 06/07/20  5:16 PM  Result Value Ref Range   Hemoglobin 12.1 (L) 13.0 - 17.0 g/dL    Comment: Performed at [facility], [address] [place], [place] [address]  Glucose, capillary     Status: Abnormal   Collection Time: 06/07/20  5:19 PM  Result Value Ref Range   Glucose-Capillary 181 (H) 70 - 99 mg/dL    Comment: Glucose reference range applies only to samples taken after fasting for at least 8 hours.   Comment 1 Notify RN    Comment 2 Document in Chart   Glucose, capillary     Status: Abnormal   Collection Time: 06/07/20  8:57 PM  Result Value Ref Range  Glucose-Capillary 175 (H) 70 - 99 mg/dL    Comment: Glucose reference range applies only to samples taken after fasting for at least 8 hours.   Comment 1 Notify RN   Glucose, capillary     Status: Abnormal   Collection Time: 06/07/20 11:50 PM  Result Value Ref Range   Glucose-Capillary 147 (H) 70 - 99 mg/dL    Comment: Glucose reference range applies only to samples taken after fasting for at least 8 hours.   Comment 1 Notify RN   Glucose, capillary     Status: Abnormal   Collection Time: 06/08/20  3:51 AM  Result Value Ref Range   Glucose-Capillary 156 (H) 70 - 99 mg/dL    Comment: Glucose reference range applies only to samples taken after fasting for at least 8 hours.   Comment 1 Notify RN   CBC with Differential/Platelet     Status: Abnormal   Collection Time: 06/08/20  5:07 AM  Result Value Ref Range   WBC  7.2 4.0 - 10.5 K/uL   RBC 3.91 (L) 4.22 - 5.81 MIL/uL   Hemoglobin 11.7 (L) 13.0 - 17.0 g/dL   HCT 32.8 (L) 39 - 52 %   MCV 83.9 80.0 - 100.0 fL   MCH 29.9 26.0 - 34.0 pg   MCHC 35.7 30.0 - 36.0 g/dL   RDW 14.4 11.5 - 15.5 %   Platelets 218 150 - 400 K/uL   nRBC 0.0 0.0 - 0.2 %   Neutrophils Relative % 61 %   Neutro Abs 4.4 1.7 - 7.7 K/uL   Lymphocytes Relative 25 %   Lymphs Abs 1.8 0.7 - 4.0 K/uL   Monocytes Relative 10 %   Monocytes Absolute 0.7 0 - 1 K/uL   Eosinophils Relative 3 %   Eosinophils Absolute 0.2 0 - 0 K/uL   Basophils Relative 1 %   Basophils Absolute 0.1 0 - 0 K/uL   Immature Granulocytes 0 %   Abs Immature Granulocytes 0.03 0.00 - 0.07 K/uL    Comment: Performed at [facility], [address] [place], [place] [address]  Glucose, capillary     Status: Abnormal   Collection Time: 06/08/20  8:24 AM  Result Value Ref Range   Glucose-Capillary 171 (H) 70 - 99 mg/dL    Comment: Glucose reference range applies only to samples taken after fasting for at least 8 hours.   Comment 1 Notify RN   Glucose, capillary     Status: Abnormal   Collection Time: 06/08/20 11:45 AM  Result Value Ref Range   Glucose-Capillary 177 (H) 70 - 99 mg/dL    Comment: Glucose reference range applies only to samples taken after fasting for at least 8 hours.   Comment 1 Notify RN   Glucose, capillary     Status: Abnormal   Collection Time: 06/08/20  4:39 PM  Result Value Ref Range   Glucose-Capillary 185 (H) 70 - 99 mg/dL    Comment: Glucose reference range applies only to samples taken after fasting for at least 8 hours.   Comment 1 Notify RN   Glucose, capillary     Status: Abnormal   Collection Time: 06/08/20 10:15 PM  Result Value Ref Range   Glucose-Capillary 163 (H) 70 - 99 mg/dL    Comment: Glucose reference range applies only to samples taken after fasting for at least 8 hours.   Comment 1 Notify RN   CBC with Differential/Platelet     Status: Abnormal   Collection  Time: 06/09/20  4:13 AM  Result Value Ref Range   WBC 8.8 4.0 - 10.5 K/uL   RBC 4.01 (L) 4.22 - 5.81 MIL/uL   Hemoglobin 12.0 (L) 13.0 - 17.0 g/dL   HCT 35.2 (L) 39 - 52 %   MCV 87.8 80.0 - 100.0 fL   MCH 29.9 26.0 - 34.0 pg   MCHC 34.1 30.0 - 36.0 g/dL   RDW 14.4 11.5 - 15.5 %   Platelets 246 150 - 400 K/uL   nRBC 0.0 0.0 - 0.2 %   Neutrophils Relative % 60 %   Neutro Abs 5.2 1.7 - 7.7 K/uL   Lymphocytes Relative 27 %   Lymphs Abs 2.4 0.7 - 4.0 K/uL   Monocytes Relative 9 %   Monocytes Absolute 0.8 0 - 1 K/uL   Eosinophils Relative 3 %   Eosinophils Absolute 0.3 0 - 0 K/uL   Basophils Relative 1 %   Basophils Absolute 0.1 0 - 0 K/uL   Immature Granulocytes 0 %   Abs Immature Granulocytes 0.03 0.00 - 0.07 K/uL    Comment: Performed at [facility], [address], [place], [place] [address]    Recent Results (from the past 240 hour(s))  SARS Coronavirus 2 by RT PCR (hospital order, performed in [facility] hospital lab) Nasopharyngeal Nasopharyngeal Swab     Status: None   Collection Time: 06/05/20  2:39 PM   Specimen: Nasopharyngeal Swab  Result Value Ref Range Status   SARS Coronavirus 2 NEGATIVE NEGATIVE Final    Comment: (NOTE) SARS-CoV-2 target nucleic acids are NOT DETECTED.  The SARS-CoV-2 RNA is generally detectable in upper and lower respiratory specimens during the acute phase of infection. The lowest concentration of SARS-CoV-2 viral copies this assay can detect is 250 copies / mL. A negative result does not preclude SARS-CoV-2 infection and should not be used as the sole basis for treatment or other patient management decisions.  A negative result may occur with improper specimen collection / handling, submission of specimen other than nasopharyngeal swab, presence of viral mutation(s) within the areas targeted by this assay, and inadequate number of viral copies (<250 copies / mL). A negative result must be combined with clinical observations,  patient history, and epidemiological information.  Fact Sheet for Patients:   StrictlyIdeas.no  Fact Sheet for Healthcare Providers: BankingDealers.co.za  This test is not yet approved or  cleared by the [place] FDA and has been authorized for detection and/or diagnosis of SARS-CoV-2 by FDA under an Emergency Use Authorization (EUA).  This EUA will remain in effect (meaning this test can be used) for the duration of the COVID-19 declaration under Section 564(b)(1) of the Act, 21 U.S.C. section 360bbb-3(b)(1), unless the authorization is terminated or revoked sooner.  Performed at [facility], [address] [place], [place] [address]     Lipid Panel No results for input(s): CHOL, TRIG, HDL, CHOLHDL, VLDL, LDLCALC in the last 72 hours.  Studies/Results: MR BRAIN W WO CONTRAST  Result Date: 06/08/2020 CLINICAL DATA:  69 year old male with slurred speech, right side weakness. Left paracentral pontine and right frontal horn white matter infarcts on MRI 3 days ago. Severe stenosis left MCA M2 branch on CTA 3 days ago. EXAM: MRI HEAD WITHOUT AND WITH CONTRAST TECHNIQUE: Multiplanar, multiecho pulse sequences of the brain and surrounding structures were obtained without and with intravenous contrast. CONTRAST:  44mL GADAVIST GADOBUTROL 1 MMOL/ML IV SOLN COMPARISON:  CT head and CTA head and neck 06/05/2020.  Brain MRI 06/05/2020. FINDINGS: Brain: Progressive restricted diffusion in the left pons, involving a fairly extensive 2.6 cm area of the brainstem there (series 2, image 20). Associated T2 and FLAIR hyperintensity. No associated hemorrhage or mass effect. Stable to slightly faded restricted diffusion in the white matter adjacent to the right frontal horn on series 2, image 30 today. No new areas of restriction. Stable gray and white matter signal elsewhere with extensive white matter T2 and FLAIR hyperintensity, occasional cystic  encephalomalacia of the white matter (left corona radiata series 9, image 18). No cortical encephalomalacia identified. Chronic lacunar infarcts in the bilateral thalami and right basal ganglia. Right thalamic chronic microhemorrhage. No midline shift, mass effect, evidence of mass lesion, ventriculomegaly, extra-axial collection or acute intracranial hemorrhage. Cervicomedullary junction and pituitary are within normal limits. No abnormal enhancement identified. No dural thickening. Vascular: Major intracranial vascular flow voids are stable. Skull and upper cervical spine: Negative visible cervical spine, bone marrow signal. Sinuses/Orbits: Stable, negative. Other: Mastoids remain clear. Grossly normal visible internal auditory structures. Scalp and face appear negative. IMPRESSION: 1. Moderate size left pontine infarct with increased diffusion restriction compared to 3 days ago, but no associated hemorrhage or mass effect. 2. Stable to perhaps slightly faded acute white matter lacunar infarct adjacent to the right frontal lobe. 3. No new intracranial abnormality. Underlying advanced chronic small vessel disease. Electronically Signed   By: Genevie Ann M.D.   On: 06/08/2020 14:15    Medications:  Scheduled: .  stroke: mapping our early stages of recovery book   Does not apply Once  . allopurinol  300 mg Oral Daily  . amLODipine  10 mg Oral Daily  . aspirin  300 mg Rectal Daily   Or  . aspirin EC  325 mg Oral Daily  . atorvastatin  80 mg Oral Daily  . cholecalciferol  1,000 Units Oral Daily  . finasteride  5 mg Oral Daily  . hydrocortisone cream   Topical Q M,W,F  . insulin aspart  0-20 Units Subcutaneous TID AC & HS  . losartan  100 mg Oral Daily  . Ensure Max Protein  11 oz Oral BID BM  . sodium chloride flush  3 mL Intravenous Once     Assessment:69 y.o.malepresenting with new onset of right sided weakness. Symptoms were first noted 2 days prior to presentation. On Thursday, his right sided  weakness was noted to have worsened; repeat MRI brain revealed more prominent DWI signal in the left paramedian pons, more prominent T2-weighted signal in the same region and one new linear focus of DWI abnormality at the midline. 1.Exam today reveals improved RUE and RLE strength relative to yesterday. Facial droop also improved. Brighter affect.   2.Overall clinical and imaging findings are most consistent with increased edema and one small linear region of stroke extension in the bed of the subacute left paramedian pontine ischemic infarction.  Of note, progressive motor deficits in the subacute phase is not uncommon in pontine infarctions. The exact mechanism for this is not definitively known, per the literature, but may involve ischemic demyelination of the corticospinal tracts not initially overlapped by the region of infarction Nicoletta Dress, et al. Neural Regen Res. 2015 Mar; 10(3): 501-504). Edema adjacent to the infarction may also contribute.  3. CTA of head and neck:No large vessel occlusion. Intracranial atherosclerosis including severe stenoses of a left M2 branch vessel and of the median artery of the corpus callosum. Strongly dominant right vertebral artery with possible moderate proximal V1 stenosis. 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 Widely patent cervical carotid arteries. 4. EKG (9/6):Normal sinus rhythm.Prolonged QT.  5.Stroke Risk Factors -Cancer history, DM, dyslipidemia, HTN and obesity. HgbA1c elevated at 7.5. Abnormal lipid profile. 6. Recent lower GIB with BRBPR. Unknown bleeding source. Hgb on admission was significantly lower than the value from 7 days prior (14.2 >> 11.9) but is relatively stable today (11.7) relative to the admission value. The patient's stool yesterday did contain some blood. No BM today (9/9).   7. TTE: No PFO or mural thrombus described in the report. LVEF is>55%. 8. TEE is being obtained today.  9. No indication for  steroids or anticoagulation.  Recommendations: 1. BP management. Goal: Normotensive.  2.Will need Zio patch or Reveal implantable cardiac monitor to assess for possible intermittent atrial fibrillation as an outpatient. .  3. PT/OT/Speech 4. Frequent neuro checks 5.ContinueASAat325 mg po qd. Due to severe intracranial atherosclerosis at some locations on CTA,would consideraddingPlavix after source of his lower GIB is found and treated.Due to GIB source not being known and drop in Hgb of 4 points in one week, the risk for a catastrophic GIB, although relatively low, is present and the risks of Plavix administration without knowing the source is felt to significantly outweigh the benefits. Discussed with patient and wife who expressed understanding and agreement with the plan.  6.Continueatorvastatin  7. Telemetry monitoring 8.Repeat MRI was reviewed and discussed with the patient and his wife yesterday. 9. A thromboxane B2 platelet aggregation assay.from [facility] to assess for ASA efficacy in this patient was ordered on Thursday. The turnaround time is 4-9 days.  10. Neurology service will continue to follow with you.       LOS: 4 days   @Electronically  signed: Dr. Kerney Elbe 06/09/2020  7:52 AM

## 2020-06-09 NOTE — Progress Notes (Signed)
OT Cancellation Note  Patient Details Name: Peter Ryan MRN: [id number] DOB: [birth year]   Cancelled Treatment:    Reason Eval/Treat Not Completed: Patient at procedure or test/ unavailable. Pt currently off the floor for TEE. OT will follow up when pt is available.   Darleen Crocker, [place], OTR/L , CBIS ascom [phone]  06/09/20, 10:07 AM  06/09/2020, 10:06 AM

## 2020-06-09 NOTE — Progress Notes (Signed)
Inpatient Rehab Admissions Coordinator:   Note pt with progression of symptoms yesterday, MRI showed small extension and increased edema.  Pt has not been able to mobilize with therapy yet, so will follow up on Monday once he's had a chance to be re-evaluated.    Shann Medal, PT, DPT Admissions Coordinator [phone] 06/09/20  10:10 AM

## 2020-06-09 NOTE — Progress Notes (Signed)
PROGRESS NOTE    Peter Ryan  BZJ:[id number] DOB: September 26, 1951 DOA: 06/05/2020 PCP: [name]., MD  Brief Narrative:  Peter Ryan is a 69 y.o. male with medical history significant forobesity, BPH, hypertension, diabetes mellitus, dyslipidemia and non-Hodgkin's lymphoma status post chemotherapy currently under surveillance.  He presented to the hospital because of right-sided weakness that started on the day prior to admission.  He still felt weak in his right leg the following day and he fell on the floor when he tried to get up to use the bathroom.  His wife also noticed slurred speech.  MRI brain showed acute left pontine and right frontal white matter infarcts.  He was on low-dose aspirin at home and neurologist has recommended aspirin be increased to 325 mg daily.  9/8: Patient seen and examined.  Wife at bedside.  Complains of weakness.  9/9: Patient seen and examined.  Wife remains at bedside.  Lengthy conversation about slow GI bleed.  Hemoglobin has been stable.  At this time we elect to defer GI evaluation outpatient.  Patient and wife were given clear instructions regarding need for urgent medical attention should bleed acutely worsen.  Approximately 1230 received a call from bedside RN with concerns of patient had worsening neurologic exam and deficits.  Worsening right-sided facial droop.  Decreased grip strength on right.  Discussed with on-call neurologist who recommended stat MRI brain.  Ordered.  Currently pending  9/10: Patient seen and examined.  Patient's case discussed with Dr. Rockey Situ from cardiology.  TEE today.  No acute findings.  Cardiology recommended EP evaluation which can be deferred outpatient for consideration of a reveal device essentially an implantable loop recorder.  Discussed plan of care with patient and wife at bedside today.  Plan of care also discussed with neurology consultant.   Assessment & Plan:   Principal Problem:   Acute CVA  (cerebrovascular accident) [facility]) Active Problems:   Type 2 diabetes mellitus (HCC)   Benign essential hypertension   Obesity, Class III, BMI 40-49.9 (morbid obesity) ([place])  Acute CVA, left pontine acute infarction 2 days prior to admission Not a TPA candidate given last known time well MRI confirmed infarction CTA head and neck: No LVO EKG normal sinus rhythm Transthoracic echocardiogram reassuring 9/9: Worsening neurologic symptoms noted.  Repeat stat MRI brain ordered -Repeat MRI brain demonstrates increased diffusion-weighted signal without hemorrhage or mass-effect -TEE today did not reveal any PFO, ASD Plan: BP management, home medications restarted Continue aspirin 325 mg p.o. daily Continue Lipitor PT and OT as tolerated Frequent neuro checks SLP evaluation -Cardiology has arrange for outpatient evaluation by EP for consideration of implantable loop recorder, reveal device  Patient could be a good candidate for dual antiplatelet therapy can considering severe intracranial atherosclerosis.  Will defer this addition for now.  Patient is currently undergoing outpatient work-up for suspected GI bleed.  I recommend that the patient go back and see the gastroenterologist to achieve resolution on GI bleed.  Once this is achieved outpatient neurology can evaluate for need regarding dual antiplatelet therapy  Lower GI bleed Patient has had intermittent BRBPR Hemoglobin has been stable Rectal bleeding has been intermittent and small-volume Could be hemorrhoidal versus possibly diverticular  Lengthy conversation with the patient and wife at bedside.  Explained that endoscopic evaluation likely involve a colonoscopy and associated bowel prep.  I offered to reach out to the gastroenterologist here.  At this time patient and wife are electing to defer this procedure to outpatient.  I  feel this is acceptable choice.  Patient's hemoglobin has been relatively stable and given his recent  infarct he is significantly debilitated.  Clear return to ED instructions have been provided should patient's bleed acutely worsen.   Hypertension Resume amlodipine, home dose 10 mg Resume losartan 100 mg nightly  Type 2 diabetes mellitus On sliding scale insulin before meals and at bedtime Can resume home regimen at discharge  Morbid obesity Dietitian consult  Gout Continue allopurinol  DVT prophylaxis: SCD Code Status: Full Family Communication: Wife at bedside Disposition Plan: Status is: Inpatient  Remains inpatient appropriate because:Inpatient level of care appropriate due to severity of illness   Dispo: The patient is from: Home              Anticipated d/c is to: [facility]              Anticipated d/c date is: 2 days              Patient currently is not medically stable to d/c.   TEE today.  Will likely not be able to go to CIR over the weekend.  We will continue to monitor.  Anticipate discharge within 48 hours.         Consultants:   Neurology, cardiology  Procedures:   None  Antimicrobials:   None   Subjective: Patient seen and examined.  Feels stronger today  Objective: Vitals:   06/09/20 0743 06/09/20 0933 06/09/20 1157 06/09/20 1243  BP: (!) 147/74 (!) 156/67 (!) 145/63 (!) 138/58  Pulse: 68 76 69 68  Resp: 15 16 15 16   Temp: 98.2 F (36.8 C) 98.1 F (36.7 C) 98.3 F (36.8 C) 98.5 F (36.9 C)  TempSrc: Oral Oral Oral Oral  SpO2: 93% 95% 96% 98%  Weight:  129.3 kg    Height:  5\' 10"  (1.778 m)      Intake/Output Summary (Last 24 hours) at 06/09/2020 1419 Last data filed at 06/09/2020 1300 Gross per 24 hour  Intake 240 ml  Output --  Net 240 ml   Filed Weights   06/05/20 1051 06/09/20 0933  Weight: 129.1 kg 129.3 kg    Examination:  General: No apparent distress, patient appears well HEENT: Normocephalic, atraumatic Neck, supple, trachea midline, no tenderness Heart: Regular rate and rhythm, S1/S2 normal, no murmurs Lungs:  Clear to auscultation bilaterally, no adventitious sounds, normal work of breathing Abdomen: Obese, soft, nontender, nondistended, positive bowel sounds  extremities: Normal, atraumatic, no clubbing or cyanosis, normal muscle tone Skin: No rashes or lesions, normal color Neurologic: Right upper and lower extremity weakness, decreased grip strength on right, right-sided facial droop psychiatric: Normal affect     Data Reviewed: I have personally reviewed following labs and imaging studies  CBC: Recent Labs  Lab 06/05/20 1100 06/07/20 1716 06/08/20 0507 06/09/20 0413  WBC 11.4*  --  7.2 8.8  NEUTROABS 8.9*  --  4.4 5.2  HGB 11.9* 12.1* 11.7* 12.0*  HCT 34.0*  --  32.8* 35.2*  MCV 85.2  --  83.9 87.8  PLT 254  --  218 347   Basic Metabolic Panel: Recent Labs  Lab 06/05/20 1100  NA 140  K 3.4*  CL 103  CO2 22  GLUCOSE 252*  BUN 19  CREATININE 0.81  CALCIUM 8.6*   GFR: Estimated Creatinine Clearance: 119.5 mL/min (by C-G formula based on SCr of 0.81 mg/dL). Liver Function Tests: Recent Labs  Lab 06/05/20 1100  AST 27  ALT 21  ALKPHOS 56  BILITOT 0.7  PROT 7.2  ALBUMIN 3.9   No results for input(s): LIPASE, AMYLASE in the last 168 hours. No results for input(s): AMMONIA in the last 168 hours. Coagulation Profile: Recent Labs  Lab 06/05/20 1100  INR 1.0   Cardiac Enzymes: No results for input(s): CKTOTAL, CKMB, CKMBINDEX, TROPONINI in the last 168 hours. BNP (last 3 results) No results for input(s): PROBNP in the last 8760 hours. HbA1C: No results for input(s): HGBA1C in the last 72 hours. CBG: Recent Labs  Lab 06/08/20 0824 06/08/20 1145 06/08/20 1639 06/08/20 2215 06/09/20 0745  GLUCAP 171* 177* 185* 163* 176*   Lipid Profile: No results for input(s): CHOL, HDL, LDLCALC, TRIG, CHOLHDL, LDLDIRECT in the last 72 hours. Thyroid Function Tests: No results for input(s): TSH, T4TOTAL, FREET4, T3FREE, THYROIDAB in the last 72 hours. Anemia  Panel: No results for input(s): VITAMINB12, FOLATE, FERRITIN, TIBC, IRON, RETICCTPCT in the last 72 hours. Sepsis Labs: No results for input(s): PROCALCITON, LATICACIDVEN in the last 168 hours.  Recent Results (from the past 240 hour(s))  SARS Coronavirus 2 by RT PCR (hospital order, performed in [facility] hospital lab) Nasopharyngeal Nasopharyngeal Swab     Status: None   Collection Time: 06/05/20  2:39 PM   Specimen: Nasopharyngeal Swab  Result Value Ref Range Status   SARS Coronavirus 2 NEGATIVE NEGATIVE Final    Comment: (NOTE) SARS-CoV-2 target nucleic acids are NOT DETECTED.  The SARS-CoV-2 RNA is generally detectable in upper and lower respiratory specimens during the acute phase of infection. The lowest concentration of SARS-CoV-2 viral copies this assay can detect is 250 copies / mL. A negative result does not preclude SARS-CoV-2 infection and should not be used as the sole basis for treatment or other patient management decisions.  A negative result may occur with improper specimen collection / handling, submission of specimen other than nasopharyngeal swab, presence of viral mutation(s) within the areas targeted by this assay, and inadequate number of viral copies (<250 copies / mL). A negative result must be combined with clinical observations, patient history, and epidemiological information.  Fact Sheet for Patients:   StrictlyIdeas.no  Fact Sheet for Healthcare Providers: BankingDealers.co.za  This test is not yet approved or  cleared by the [place] FDA and has been authorized for detection and/or diagnosis of SARS-CoV-2 by FDA under an Emergency Use Authorization (EUA).  This EUA will remain in effect (meaning this test can be used) for the duration of the COVID-19 declaration under Section 564(b)(1) of the Act, 21 U.S.C. section 360bbb-3(b)(1), unless the authorization is terminated or revoked  sooner.  Performed at [facility] ([place] [address] [place], [place] [address]          Radiology Studies: MR BRAIN W WO CONTRAST  Result Date: 06/08/2020 CLINICAL DATA:  69 year old male with slurred speech, right side weakness. Left paracentral pontine and right frontal horn white matter infarcts on MRI 3 days ago. Severe stenosis left MCA M2 branch on CTA 3 days ago. EXAM: MRI HEAD WITHOUT AND WITH CONTRAST TECHNIQUE: Multiplanar, multiecho pulse sequences of the brain and surrounding structures were obtained without and with intravenous contrast. CONTRAST:  71mL GADAVIST GADOBUTROL 1 MMOL/ML IV SOLN COMPARISON:  CT head and CTA head and neck 06/05/2020. Brain MRI 06/05/2020. FINDINGS: Brain: Progressive restricted diffusion in the left pons, involving a fairly extensive 2.6 cm area of the brainstem there (series 2, image 20). Associated T2 and FLAIR hyperintensity. No associated hemorrhage or mass effect. Stable to slightly faded  restricted diffusion in the white matter adjacent to the right frontal horn on series 2, image 30 today. No new areas of restriction. Stable gray and white matter signal elsewhere with extensive white matter T2 and FLAIR hyperintensity, occasional cystic encephalomalacia of the white matter (left corona radiata series 9, image 18). No cortical encephalomalacia identified. Chronic lacunar infarcts in the bilateral thalami and right basal ganglia. Right thalamic chronic microhemorrhage. No midline shift, mass effect, evidence of mass lesion, ventriculomegaly, extra-axial collection or acute intracranial hemorrhage. Cervicomedullary junction and pituitary are within normal limits. No abnormal enhancement identified. No dural thickening. Vascular: Major intracranial vascular flow voids are stable. Skull and upper cervical spine: Negative visible cervical spine, bone marrow signal. Sinuses/Orbits: Stable, negative. Other: Mastoids remain clear. Grossly normal visible  internal auditory structures. Scalp and face appear negative. IMPRESSION: 1. Moderate size left pontine infarct with increased diffusion restriction compared to 3 days ago, but no associated hemorrhage or mass effect. 2. Stable to perhaps slightly faded acute white matter lacunar infarct adjacent to the right frontal lobe. 3. No new intracranial abnormality. Underlying advanced chronic small vessel disease. Electronically Signed   By: Genevie Ann M.D.   On: 06/08/2020 14:15        Scheduled Meds: .  stroke: mapping our early stages of recovery book   Does not apply Once  . allopurinol  300 mg Oral Daily  . amLODipine  10 mg Oral Daily  . aspirin  300 mg Rectal Daily   Or  . aspirin EC  325 mg Oral Daily  . atorvastatin  80 mg Oral Daily  . cholecalciferol  1,000 Units Oral Daily  . fentaNYL      . finasteride  5 mg Oral Daily  . hydrocortisone cream   Topical Q M,W,F  . insulin aspart  0-20 Units Subcutaneous TID AC & HS  . lidocaine      . losartan  100 mg Oral Daily  . midazolam      . Ensure Max Protein  11 oz Oral BID BM  . sodium chloride flush  3 mL Intravenous Once  . sodium chloride flush       Continuous Infusions:   LOS: 4 days    Time spent: 25 minutes    Sidney Ace, MD [facility] Pager [phone]  If 7PM-7AM, please contact night-coverage 06/09/2020, 2:19 PM

## 2020-06-10 LAB — MISC LABCORP TEST (SEND OUT)
LabCorp test name: 11
Labcorp test code: 501620

## 2020-06-10 LAB — CBC WITH DIFFERENTIAL/PLATELET
Abs Immature Granulocytes: 0.05 10*3/uL (ref 0.00–0.07)
Basophils Absolute: 0 10*3/uL (ref 0.0–0.1)
Basophils Relative: 1 %
Eosinophils Absolute: 0.2 10*3/uL (ref 0.0–0.5)
Eosinophils Relative: 3 %
HCT: 33.5 % — ABNORMAL LOW (ref 39.0–52.0)
Hemoglobin: 11.8 g/dL — ABNORMAL LOW (ref 13.0–17.0)
Immature Granulocytes: 1 %
Lymphocytes Relative: 24 %
Lymphs Abs: 2.1 10*3/uL (ref 0.7–4.0)
MCH: 29.9 pg (ref 26.0–34.0)
MCHC: 35.2 g/dL (ref 30.0–36.0)
MCV: 85 fL (ref 80.0–100.0)
Monocytes Absolute: 0.7 10*3/uL (ref 0.1–1.0)
Monocytes Relative: 8 %
Neutro Abs: 5.5 10*3/uL (ref 1.7–7.7)
Neutrophils Relative %: 63 %
Platelets: 236 10*3/uL (ref 150–400)
RBC: 3.94 MIL/uL — ABNORMAL LOW (ref 4.22–5.81)
RDW: 14.7 % (ref 11.5–15.5)
WBC: 8.6 10*3/uL (ref 4.0–10.5)
nRBC: 0 % (ref 0.0–0.2)

## 2020-06-10 LAB — GLUCOSE, CAPILLARY
Glucose-Capillary: 143 mg/dL — ABNORMAL HIGH (ref 70–99)
Glucose-Capillary: 158 mg/dL — ABNORMAL HIGH (ref 70–99)
Glucose-Capillary: 159 mg/dL — ABNORMAL HIGH (ref 70–99)
Glucose-Capillary: 163 mg/dL — ABNORMAL HIGH (ref 70–99)
Glucose-Capillary: 192 mg/dL — ABNORMAL HIGH (ref 70–99)
Glucose-Capillary: 198 mg/dL — ABNORMAL HIGH (ref 70–99)

## 2020-06-10 MED ORDER — HYDRALAZINE HCL 20 MG/ML IJ SOLN: 10.0000 mg | INTRAMUSCULAR | Status: DC | PRN

## 2020-06-10 NOTE — Progress Notes (Signed)
Subjective: States that he feels his right sided weakness is slightly improved since yesterday.   Objective: Current vital signs: BP (!) 144/67 (BP Location: Left Arm)   Pulse 69   Temp 98.5 F (36.9 C) (Oral)   Resp 14   Ht 5\' 10"  (1.778 m)   Wt 129.3 kg   SpO2 99%   BMI 40.89 kg/m  Vital signs in last 24 hours: Temp:  [98.1 F (36.7 C)-99.2 F (37.3 C)] 98.5 F (36.9 C) (09/11 0749) Pulse Rate:  [68-81] 69 (09/11 0749) Resp:  [14-19] 14 (09/11 0749) BP: (81-171)/(39-148) 144/67 (09/11 0749) SpO2:  [90 %-99 %] 99 % (09/11 0749) Weight:  [129.3 kg] 129.3 kg (09/10 0933)  Intake/Output from previous day: 09/10 0701 - 09/11 0700 In: 240 [P.O.:240] Out: 500 [Urine:500] Intake/Output this shift: No intake/output data recorded. Nutritional status:  Diet Order            Diet heart healthy/carb modified Room service appropriate? Yes; Fluid consistency: Thin  Diet effective now                 HEENT: [place]/AT  Lungs: Respirations unlabored Ext: Chronic pigmentary changes to distal BLE  Neurologic Exam: Ment: Awake, alert and oriented. Bright affect today. Able to follow all commands. Speech is fluent. Mild dysarthria. Able to answer complex questions.  CN: PERRL. EOMI without nystagmus. Temp sensation equal bilaterally. Right facial droop is noted. Motor:  LUE and LLE 5/5 RUE 2/5 deltoid. 3/5 biceps, triceps and grip strength RLE 2/5 hip flexion. 3/5 knee extension and APF. 1/5 ADF.   Sensory: Intact to FT and temp sensation x 4 without asymmetry. No extinction to DSS.  Reflexes: Unchanged.    Lab Results: Results for orders placed or performed during the hospital encounter of 06/05/20 (from the past 48 hour(s))  Glucose, capillary     Status: Abnormal   Collection Time: 06/08/20  8:24 AM  Result Value Ref Range   Glucose-Capillary 171 (H) 70 - 99 mg/dL    Comment: Glucose reference range applies only to samples taken after fasting for at least 8 hours.    Comment 1 Notify RN   Glucose, capillary     Status: Abnormal   Collection Time: 06/08/20 11:45 AM  Result Value Ref Range   Glucose-Capillary 177 (H) 70 - 99 mg/dL    Comment: Glucose reference range applies only to samples taken after fasting for at least 8 hours.   Comment 1 Notify RN   Glucose, capillary     Status: Abnormal   Collection Time: 06/08/20  4:39 PM  Result Value Ref Range   Glucose-Capillary 185 (H) 70 - 99 mg/dL    Comment: Glucose reference range applies only to samples taken after fasting for at least 8 hours.   Comment 1 Notify RN   Glucose, capillary     Status: Abnormal   Collection Time: 06/08/20 10:15 PM  Result Value Ref Range   Glucose-Capillary 163 (H) 70 - 99 mg/dL    Comment: Glucose reference range applies only to samples taken after fasting for at least 8 hours.   Comment 1 Notify RN   Miscellaneous [facility] test (send-out)     Status: None   Collection Time: 06/08/20 11:49 PM  Result Value Ref Range   [facility] test code [id number]     Comment: CORRECTED ON 09/10 AT 0000: PREVIOUSLY REPORTED AS URINE, RANDOM   [facility] test name 11 DEHYDRO THROMBOXANE B2     Comment: Performed at  [facility], [address] [place],  [address] CORRECTED ON 09/10 AT 0000: PREVIOUSLY REPORTED AS URINE, RANDOM    Misc [facility] result COMMENT     Comment: (NOTE) Performed At: [facility] [place], [place] [id number] Rush Farmer MD LD:[id number]   CBC with Differential/Platelet     Status: Abnormal   Collection Time: 06/09/20  4:13 AM  Result Value Ref Range   WBC 8.8 4.0 - 10.5 K/uL   RBC 4.01 (L) 4.22 - 5.81 MIL/uL   Hemoglobin 12.0 (L) 13.0 - 17.0 g/dL   HCT 35.2 (L) 39 - 52 %   MCV 87.8 80.0 - 100.0 fL   MCH 29.9 26.0 - 34.0 pg   MCHC 34.1 30.0 - 36.0 g/dL   RDW 14.4 11.5 - 15.5 %   Platelets 246 150 - 400 K/uL   nRBC 0.0 0.0 - 0.2 %   Neutrophils Relative % 60 %   Neutro Abs 5.2 1.7 - 7.7 K/uL   Lymphocytes Relative  27 %   Lymphs Abs 2.4 0.7 - 4.0 K/uL   Monocytes Relative 9 %   Monocytes Absolute 0.8 0 - 1 K/uL   Eosinophils Relative 3 %   Eosinophils Absolute 0.3 0 - 0 K/uL   Basophils Relative 1 %   Basophils Absolute 0.1 0 - 0 K/uL   Immature Granulocytes 0 %   Abs Immature Granulocytes 0.03 0.00 - 0.07 K/uL    Comment: Performed at [facility], [address] [place], [place] [address]  Glucose, capillary     Status: Abnormal   Collection Time: 06/09/20  7:45 AM  Result Value Ref Range   Glucose-Capillary 176 (H) 70 - 99 mg/dL    Comment: Glucose reference range applies only to samples taken after fasting for at least 8 hours.  Glucose, capillary     Status: Abnormal   Collection Time: 06/09/20  4:04 PM  Result Value Ref Range   Glucose-Capillary 205 (H) 70 - 99 mg/dL    Comment: Glucose reference range applies only to samples taken after fasting for at least 8 hours.  Glucose, capillary     Status: Abnormal   Collection Time: 06/09/20  8:48 PM  Result Value Ref Range   Glucose-Capillary 152 (H) 70 - 99 mg/dL    Comment: Glucose reference range applies only to samples taken after fasting for at least 8 hours.   Comment 1 Notify RN   Glucose, capillary     Status: Abnormal   Collection Time: 06/10/20 12:56 AM  Result Value Ref Range   Glucose-Capillary 143 (H) 70 - 99 mg/dL    Comment: Glucose reference range applies only to samples taken after fasting for at least 8 hours.   Comment 1 Notify RN   CBC with Differential/Platelet     Status: Abnormal   Collection Time: 06/10/20  4:07 AM  Result Value Ref Range   WBC 8.6 4.0 - 10.5 K/uL   RBC 3.94 (L) 4.22 - 5.81 MIL/uL   Hemoglobin 11.8 (L) 13.0 - 17.0 g/dL   HCT 33.5 (L) 39 - 52 %   MCV 85.0 80.0 - 100.0 fL   MCH 29.9 26.0 - 34.0 pg   MCHC 35.2 30.0 - 36.0 g/dL   RDW 14.7 11.5 - 15.5 %   Platelets 236 150 - 400 K/uL   nRBC 0.0 0.0 - 0.2 %   Neutrophils Relative % 63 %   Neutro Abs 5.5 1.7 - 7.7 K/uL  Lymphocytes  Relative 24 %   Lymphs Abs 2.1 0.7 - 4.0 K/uL   Monocytes Relative 8 %   Monocytes Absolute 0.7 0 - 1 K/uL   Eosinophils Relative 3 %   Eosinophils Absolute 0.2 0 - 0 K/uL   Basophils Relative 1 %   Basophils Absolute 0.0 0 - 0 K/uL   Immature Granulocytes 1 %   Abs Immature Granulocytes 0.05 0.00 - 0.07 K/uL    Comment: Performed at [facility], [address] [place], [place] [address]  Glucose, capillary     Status: Abnormal   Collection Time: 06/10/20  4:21 AM  Result Value Ref Range   Glucose-Capillary 159 (H) 70 - 99 mg/dL    Comment: Glucose reference range applies only to samples taken after fasting for at least 8 hours.   Comment 1 Notify RN     Recent Results (from the past 240 hour(s))  SARS Coronavirus 2 by RT PCR (hospital order, performed in [facility] hospital lab) Nasopharyngeal Nasopharyngeal Swab     Status: None   Collection Time: 06/05/20  2:39 PM   Specimen: Nasopharyngeal Swab  Result Value Ref Range Status   SARS Coronavirus 2 NEGATIVE NEGATIVE Final    Comment: (NOTE) SARS-CoV-2 target nucleic acids are NOT DETECTED.  The SARS-CoV-2 RNA is generally detectable in upper and lower respiratory specimens during the acute phase of infection. The lowest concentration of SARS-CoV-2 viral copies this assay can detect is 250 copies / mL. A negative result does not preclude SARS-CoV-2 infection and should not be used as the sole basis for treatment or other patient management decisions.  A negative result may occur with improper specimen collection / handling, submission of specimen other than nasopharyngeal swab, presence of viral mutation(s) within the areas targeted by this assay, and inadequate number of viral copies (<250 copies / mL). A negative result must be combined with clinical observations, patient history, and epidemiological information.  Fact Sheet for Patients:   StrictlyIdeas.no  Fact Sheet for Healthcare  Providers: BankingDealers.co.za  This test is not yet approved or  cleared by the [place] FDA and has been authorized for detection and/or diagnosis of SARS-CoV-2 by FDA under an Emergency Use Authorization (EUA).  This EUA will remain in effect (meaning this test can be used) for the duration of the COVID-19 declaration under Section 564(b)(1) of the Act, 21 U.S.C. section 360bbb-3(b)(1), unless the authorization is terminated or revoked sooner.  Performed at [facility], [address] [place], [place] [address]     Lipid Panel No results for input(s): CHOL, TRIG, HDL, CHOLHDL, VLDL, LDLCALC in the last 72 hours.  Studies/Results: MR BRAIN W WO CONTRAST  Result Date: 06/08/2020 CLINICAL DATA:  69 year old male with slurred speech, right side weakness. Left paracentral pontine and right frontal horn white matter infarcts on MRI 3 days ago. Severe stenosis left MCA M2 branch on CTA 3 days ago. EXAM: MRI HEAD WITHOUT AND WITH CONTRAST TECHNIQUE: Multiplanar, multiecho pulse sequences of the brain and surrounding structures were obtained without and with intravenous contrast. CONTRAST:  49mL GADAVIST GADOBUTROL 1 MMOL/ML IV SOLN COMPARISON:  CT head and CTA head and neck 06/05/2020. Brain MRI 06/05/2020. FINDINGS: Brain: Progressive restricted diffusion in the left pons, involving a fairly extensive 2.6 cm area of the brainstem there (series 2, image 20). Associated T2 and FLAIR hyperintensity. No associated hemorrhage or mass effect. Stable to slightly faded restricted diffusion in the white matter adjacent to the right frontal  horn on series 2, image 30 today. No new areas of restriction. Stable gray and white matter signal elsewhere with extensive white matter T2 and FLAIR hyperintensity, occasional cystic encephalomalacia of the white matter (left corona radiata series 9, image 18). No cortical encephalomalacia identified. Chronic lacunar infarcts in the  bilateral thalami and right basal ganglia. Right thalamic chronic microhemorrhage. No midline shift, mass effect, evidence of mass lesion, ventriculomegaly, extra-axial collection or acute intracranial hemorrhage. Cervicomedullary junction and pituitary are within normal limits. No abnormal enhancement identified. No dural thickening. Vascular: Major intracranial vascular flow voids are stable. Skull and upper cervical spine: Negative visible cervical spine, bone marrow signal. Sinuses/Orbits: Stable, negative. Other: Mastoids remain clear. Grossly normal visible internal auditory structures. Scalp and face appear negative. IMPRESSION: 1. Moderate size left pontine infarct with increased diffusion restriction compared to 3 days ago, but no associated hemorrhage or mass effect. 2. Stable to perhaps slightly faded acute white matter lacunar infarct adjacent to the right frontal lobe. 3. No new intracranial abnormality. Underlying advanced chronic small vessel disease. Electronically Signed   By: Genevie Ann M.D.   On: 06/08/2020 14:15   ECHO TEE  Result Date: 06/09/2020    TRANSESOPHOGEAL ECHO REPORT   Patient Name:   Peter Ryan Date of Exam: 06/09/2020 Medical Rec #:  [id number]       Height:       70.0 in Accession #:    [id number]      Weight:       284.6 lb Date of Birth:  Oct 20, 1950       BSA:          2.425 m Patient Age:    69 years        BP:           103/53 mmHg Patient Gender: M               HR:           78 bpm. Exam Location:  [facility] Procedure: Transesophageal Echo, Color Doppler, Cardiac Doppler and Saline            Contrast Bubble Study Indications:     Stroke 434.91  History:         Patient has prior history of Echocardiogram examinations, most                  recent 06/05/2020. Risk Factors:Diabetes and Hypertension.  Sonographer:     Sherrie Sport RDCS (AE) Referring Phys:  [place] Diagnosing Phys: Ida Rogue MD PROCEDURE: After discussion of the risks and benefits of a TEE, an  informed consent was obtained from the patient. TEE procedure time was 35 minutes. The transesophogeal probe was passed without difficulty through the esophogus of the patient. Imaged were obtained with the patient in a left lateral decubitus position. Local oropharyngeal anesthetic was provided with Benzocaine spray and Cetacaine. Sedation performed by performing physician. Image quality was excellent. The patient's vital signs; including heart rate, blood pressure, and oxygen saturation; remained stable throughout the procedure. The patient developed no complications during the procedure. IMPRESSIONS  1. Left ventricular ejection fraction, by estimation, is 55 %. The left ventricle has normal function. The left ventricle has no regional wall motion abnormalities.  2. Right ventricular systolic function is normal. The right ventricular size is normal.  3. No left atrial/left atrial appendage thrombus was detected.  4. Negative saline contrast bubble study, unable to test with valsalva. Conclusion(s)/Recommendation(s): Normal biventricular function  without evidence of hemodynamically significant valvular heart disease. FINDINGS  Left Ventricle: Left ventricular ejection fraction, by estimation, is 55 to 60%. The left ventricle has normal function. The left ventricle has no regional wall motion abnormalities. The left ventricular internal cavity size was normal in size. There is  no left ventricular hypertrophy. Right Ventricle: The right ventricular size is normal. No increase in right ventricular wall thickness. Right ventricular systolic function is normal. Left Atrium: Left atrial size was normal in size. No left atrial/left atrial appendage thrombus was detected. Right Atrium: Right atrial size was normal in size. Pericardium: There is no evidence of pericardial effusion. Mitral Valve: The mitral valve is normal in structure. No evidence of mitral valve regurgitation. No evidence of mitral valve stenosis.  Tricuspid Valve: The tricuspid valve is normal in structure. Tricuspid valve regurgitation is not demonstrated. No evidence of tricuspid stenosis. Aortic Valve: The aortic valve is normal in structure. Aortic valve regurgitation is not visualized. No aortic stenosis is present. Pulmonic Valve: The pulmonic valve was normal in structure. Pulmonic valve regurgitation is not visualized. No evidence of pulmonic stenosis. Aorta: The aortic root is normal in size and structure. Venous: The inferior vena cava is normal in size with greater than 50% respiratory variability, suggesting right atrial pressure of 3 mmHg. IAS/Shunts: No atrial level shunt detected by color flow Doppler. Agitated saline contrast was given intravenously to evaluate for intracardiac shunting. There is no evidence of an atrial septal defect. Ida Rogue MD Electronically signed by Ida Rogue MD Signature Date/Time: 06/09/2020/5:05:41 PM    Final     Medications:  Scheduled: .  stroke: mapping our early stages of recovery book   Does not apply Once  . allopurinol  300 mg Oral Daily  . amLODipine  10 mg Oral Daily  . aspirin  300 mg Rectal Daily   Or  . aspirin EC  325 mg Oral Daily  . atorvastatin  80 mg Oral Daily  . cholecalciferol  1,000 Units Oral Daily  . finasteride  5 mg Oral Daily  . hydrocortisone cream   Topical Q M,W,F  . insulin aspart  0-20 Units Subcutaneous TID AC & HS  . losartan  100 mg Oral Daily  . Ensure Max Protein  11 oz Oral BID BM  . sodium chloride flush  3 mL Intravenous Once    Assessment:69 y.o.malepresenting with new onset of right sided weakness. Symptoms were first noted 2 daysprior to presentation. MRI revealed an acute left paramedian pontine ischemic infarction and a subcentimeter deep white matter ischemic infarction in the right ACA territory. On Thursday, his right sided weakness was noted to have worsened; repeat MRI brain revealed more prominent DWI signal in the left paramedian  pons, more prominent T2-weighted signal in the same region and one new linear focus of DWI abnormality at the midline. His exam then improved on Friday. 1.Exam today reveals slightly worse deltoid and hip flexor strength relative to yesterday. Has had fluctuating strength since the stroke, but overall trend is towards improvement. See below regarding most likely pathophysiological mechanism for the fluctuations.    2.Overall clinical and imaging findings at the time of his worsening on Thursday were most consistent with worsened motor function secondary to increased edema and one small linear region of stroke extension in the bed of the subacute left paramedian pontine ischemic infarction.  Of note, progressive motor deficits in the subacute phase is not uncommon in pontine infarctions. The exact mechanism for this is not  definitively known, per the literature, but may involve ischemic demyelination of the corticospinal tracts not initially overlapped by the region of infarction Nicoletta Dress, et al. Neural Regen Res. 2015 Mar; 10(3): 501-504). Edema adjacent to the infarction may also contribute.  3. CTA of head and neck:No large vessel occlusion. Intracranial atherosclerosis including severe stenoses of a left M2 branch vessel and of the median artery of the corpus callosum. Strongly dominant right vertebral artery with possible moderate proximal V1 stenosis. Hypoplastic left vertebral artery with a severe distal V4 stenosis. Widely patent cervical carotid arteries. 4. EKG (9/6):Normal sinus rhythm.Prolonged QT.  5.Stroke Risk Factors -Cancer history, DM, dyslipidemia, HTN and obesity. HgbA1c elevated at 7.5. Abnormal lipid profile. 6. Recent lower GIB with BRBPR. Unknown bleeding source.Hgb on admission was significantly lower than the value from 7 days prior (14.2 >>11.9) but is relatively stable today (11.7) relative to the admission value. The patient's stool yesterday did contain some blood. No BM today  (9/9). 7. TTE:No PFO or mural thrombus described in the report. LVEF is>55%. 8. TEE was completed on Friday. No left atrial or left atrial appendage thrombus was seen. No PFO or ASD; saline contrast bubble study negative.   Recommendations: 1. BP management. Goal: Normotensive.  2.Will need Zio patch or Reveal implantable cardiac monitor to assess for possible intermittent atrial fibrillation as an outpatient. .  3. PT/OT/Speech 4. Frequent neuro checks 5.ContinueASAat325 mg po qd. Due to severe intracranial atherosclerosisat some locations on CTA,would consideraddingPlavix after source of his lower GIB is found and treated.Due to GIB source not being known and drop in Hgb of 4 points in one week, the risk for a catastrophic GIB, although relatively low, is present and the risks of Plavix administration without knowing the source is felt to significantly outweigh the benefits. Discussed with patient and wife who expressed understanding and agreement with the plan. 6.Continueatorvastatin  7. Telemetry monitoring 8.Athromboxane B2platelet aggregation assay.from [facility] to assess for ASA efficacy in this patient was ordered on Thursday. The turnaround time is 4-9 days.  9. Neurology service will continue to follow with you.   LOS: 5 days   @Electronically  signed: Dr. Kerney Elbe 06/10/2020  8:13 AM

## 2020-06-10 NOTE — Progress Notes (Signed)
PROGRESS NOTE    Peter Ryan  ZJI:[id number] DOB: [birth year] DOA: 06/05/2020 PCP: [name]., MD  Brief Narrative:  Peter Ryan is a 69 y.o. male with medical history significant forobesity, BPH, hypertension, diabetes mellitus, dyslipidemia and non-Hodgkin's lymphoma status post chemotherapy currently under surveillance.  He presented to the hospital because of right-sided weakness that started on the day prior to admission.  He still felt weak in his right leg the following day and he fell on the floor when he tried to get up to use the bathroom.  His wife also noticed slurred speech.  MRI brain showed acute left pontine and right frontal white matter infarcts.  He was on low-dose aspirin at home and neurologist has recommended aspirin be increased to 325 mg daily.  9/8: Patient seen and examined.  Wife at bedside.  Complains of weakness.  9/9: Patient seen and examined.  Wife remains at bedside.  Lengthy conversation about slow GI bleed.  Hemoglobin has been stable.  At this time we elect to defer GI evaluation outpatient.  Patient and wife were given clear instructions regarding need for urgent medical attention should bleed acutely worsen.  Approximately 1230 received a call from bedside RN with concerns of patient had worsening neurologic exam and deficits.  Worsening right-sided facial droop.  Decreased grip strength on right.  Discussed with on-call neurologist who recommended stat MRI brain.  Ordered.  Currently pending  9/10: Patient seen and examined.  Patient's case discussed with Dr. Rockey Situ from cardiology.  TEE today.  No acute findings.  Cardiology recommended EP evaluation which can be deferred outpatient for consideration of a reveal device essentially an implantable loop recorder.  Discussed plan of care with patient and wife at bedside today.  Plan of care also discussed with neurology consultant.  9/11: Patient seen and examined.  TEE complete yesterday.   No acute findings.  Outpatient EP follow-up for reveal device consideration.  Plan remains to get patient to CIR.  Unfortunately is what happened over the weekend.   Assessment & Plan:   Principal Problem:   Acute CVA (cerebrovascular accident) [facility]) Active Problems:   Type 2 diabetes mellitus (HCC)   Benign essential hypertension   Obesity, Class III, BMI 40-49.9 (morbid obesity) ([place])  Acute CVA, left pontine acute infarction 2 days prior to admission Not a TPA candidate given last known time well MRI confirmed infarction CTA head and neck: No LVO EKG normal sinus rhythm Transthoracic echocardiogram reassuring 9/9: Worsening neurologic symptoms noted.  Repeat stat MRI brain ordered -Repeat MRI brain demonstrates increased diffusion-weighted signal without hemorrhage or mass-effect -TEE 9/10 did not reveal any PFO, ASD Plan: BP management, home medications restarted Continue aspirin 325 mg p.o. daily Continue Lipitor PT and OT as tolerated Frequent neuro checks SLP evaluation -Cardiology has arrange for outpatient evaluation by EP for consideration of implantable loop recorder, reveal device  Patient could be a good candidate for dual antiplatelet therapy can considering severe intracranial atherosclerosis.  Will defer this addition for now.  Patient is currently undergoing outpatient work-up for suspected GI bleed.  I recommend that the patient go back and see the gastroenterologist to achieve resolution on GI bleed.  Once this is achieved outpatient neurology can evaluate for need regarding dual antiplatelet therapy  Lower GI bleed Patient has had intermittent BRBPR Hemoglobin has been stable Rectal bleeding has been intermittent and small-volume Could be hemorrhoidal versus possibly diverticular  Lengthy conversation with the patient and wife at bedside.  Explained  that endoscopic evaluation likely involve a colonoscopy and associated bowel prep.  I offered to reach out to  the gastroenterologist here.  At this time patient and wife are electing to defer this procedure to outpatient.  I feel this is acceptable choice.  Patient's hemoglobin has been relatively stable and given his recent infarct he is significantly debilitated.  Clear return to ED instructions have been provided should patient's bleed acutely worsen.   Hypertension Resume amlodipine, home dose 10 mg Resume losartan 100 mg nightly  Type 2 diabetes mellitus On sliding scale insulin before meals and at bedtime Can resume home regimen at discharge  Morbid obesity Dietitian consult  Gout Continue allopurinol  DVT prophylaxis: SCD Code Status: Full Family Communication: Wife at bedside Disposition Plan: Status is: Inpatient  Remains inpatient appropriate because:Inpatient level of care appropriate due to severity of illness   Dispo: The patient is from: Home              Anticipated d/c is to: CIR              Anticipated d/c date is: 1 day              Patient currently is not medically stable to d/c.   Patient still has some mild residual CVA symptoms.  Per neurology this is known with pontine infarcts.  Patient is relatively stable.  I would say 1 additional day of inpatient treatment and monitoring patient will be medically ready for discharge to CIR on 06/11/2020.  He likely will not be able to dispo before 06/12/2020   Consultants:   Neurology, cardiology  Procedures:   None  Antimicrobials:   None   Subjective: Patient seen and examined.  Persistent right-sided weakness endorsed  Objective: Vitals:   06/09/20 1602 06/09/20 2017 06/10/20 0056 06/10/20 0749  BP: 118/87 (!) 155/73 (!) 148/62 (!) 144/67  Pulse: 79 80 70 69  Resp: 16 18 18 14   Temp: 99.2 F (37.3 C) 98.4 F (36.9 C) 98.4 F (36.9 C) 98.5 F (36.9 C)  TempSrc: Oral Oral Oral Oral  SpO2: 95% 94% 94% 99%  Weight:      Height:        Intake/Output Summary (Last 24 hours) at 06/10/2020 1117 Last  data filed at 06/09/2020 1835 Gross per 24 hour  Intake 240 ml  Output --  Net 240 ml   Filed Weights   06/05/20 1051 06/09/20 0933  Weight: 129.1 kg 129.3 kg    Examination:  General: No apparent distress, patient appears well HEENT: Normocephalic, atraumatic Neck, supple, trachea midline, no tenderness Heart: Regular rate and rhythm, S1/S2 normal, no murmurs Lungs: Clear to auscultation bilaterally, no adventitious sounds, normal work of breathing Abdomen: Obese, Soft, nontender, nondistended, positive bowel sounds Extremities: Normal, atraumatic, no clubbing or cyanosis, normal muscle tone Skin: No rashes or lesions, normal color Neurologic:Right upper and lower extremity weakness, decreased grip strength on right, right-sided facial droop3 Psychiatric: Normal affect      Data Reviewed: I have personally reviewed following labs and imaging studies  CBC: Recent Labs  Lab 06/05/20 1100 06/07/20 1716 06/08/20 0507 06/09/20 0413 06/10/20 0407  WBC 11.4*  --  7.2 8.8 8.6  NEUTROABS 8.9*  --  4.4 5.2 5.5  HGB 11.9* 12.1* 11.7* 12.0* 11.8*  HCT 34.0*  --  32.8* 35.2* 33.5*  MCV 85.2  --  83.9 87.8 85.0  PLT 254  --  218 246 431   Basic Metabolic Panel: Recent  Labs  Lab 06/05/20 1100  NA 140  K 3.4*  CL 103  CO2 22  GLUCOSE 252*  BUN 19  CREATININE 0.81  CALCIUM 8.6*   GFR: Estimated Creatinine Clearance: 119.5 mL/min (by C-G formula based on SCr of 0.81 mg/dL). Liver Function Tests: Recent Labs  Lab 06/05/20 1100  AST 27  ALT 21  ALKPHOS 56  BILITOT 0.7  PROT 7.2  ALBUMIN 3.9   No results for input(s): LIPASE, AMYLASE in the last 168 hours. No results for input(s): AMMONIA in the last 168 hours. Coagulation Profile: Recent Labs  Lab 06/05/20 1100  INR 1.0   Cardiac Enzymes: No results for input(s): CKTOTAL, CKMB, CKMBINDEX, TROPONINI in the last 168 hours. BNP (last 3 results) No results for input(s): PROBNP in the last 8760  hours. HbA1C: No results for input(s): HGBA1C in the last 72 hours. CBG: Recent Labs  Lab 06/09/20 1604 06/09/20 2048 06/10/20 0056 06/10/20 0421 06/10/20 0751  GLUCAP 205* 152* 143* 159* 158*   Lipid Profile: No results for input(s): CHOL, HDL, LDLCALC, TRIG, CHOLHDL, LDLDIRECT in the last 72 hours. Thyroid Function Tests: No results for input(s): TSH, T4TOTAL, FREET4, T3FREE, THYROIDAB in the last 72 hours. Anemia Panel: No results for input(s): VITAMINB12, FOLATE, FERRITIN, TIBC, IRON, RETICCTPCT in the last 72 hours. Sepsis Labs: No results for input(s): PROCALCITON, LATICACIDVEN in the last 168 hours.  Recent Results (from the past 240 hour(s))  SARS Coronavirus 2 by RT PCR (hospital order, performed in [facility] hospital lab) Nasopharyngeal Nasopharyngeal Swab     Status: None   Collection Time: 06/05/20  2:39 PM   Specimen: Nasopharyngeal Swab  Result Value Ref Range Status   SARS Coronavirus 2 NEGATIVE NEGATIVE Final    Comment: (NOTE) SARS-CoV-2 target nucleic acids are NOT DETECTED.  The SARS-CoV-2 RNA is generally detectable in upper and lower respiratory specimens during the acute phase of infection. The lowest concentration of SARS-CoV-2 viral copies this assay can detect is 250 copies / mL. A negative result does not preclude SARS-CoV-2 infection and should not be used as the sole basis for treatment or other patient management decisions.  A negative result may occur with improper specimen collection / handling, submission of specimen other than nasopharyngeal swab, presence of viral mutation(s) within the areas targeted by this assay, and inadequate number of viral copies (<250 copies / mL). A negative result must be combined with clinical observations, patient history, and epidemiological information.  Fact Sheet for Patients:   StrictlyIdeas.no  Fact Sheet for Healthcare  Providers: BankingDealers.co.za  This test is not yet approved or  cleared by the [place] FDA and has been authorized for detection and/or diagnosis of SARS-CoV-2 by FDA under an Emergency Use Authorization (EUA).  This EUA will remain in effect (meaning this test can be used) for the duration of the COVID-19 declaration under Section 564(b)(1) of the Act, 21 U.S.C. section 360bbb-3(b)(1), unless the authorization is terminated or revoked sooner.  Performed at [facility], [address] [place], [place] [address]          Radiology Studies: MR BRAIN W WO CONTRAST  Result Date: 06/08/2020 CLINICAL DATA:  69 year old male with slurred speech, right side weakness. Left paracentral pontine and right frontal horn white matter infarcts on MRI 3 days ago. Severe stenosis left MCA M2 branch on CTA 3 days ago. EXAM: MRI HEAD WITHOUT AND WITH CONTRAST TECHNIQUE: Multiplanar, multiecho pulse sequences of the brain and surrounding structures were obtained without  and with intravenous contrast. CONTRAST:  54mL GADAVIST GADOBUTROL 1 MMOL/ML IV SOLN COMPARISON:  CT head and CTA head and neck 06/05/2020. Brain MRI 06/05/2020. FINDINGS: Brain: Progressive restricted diffusion in the left pons, involving a fairly extensive 2.6 cm area of the brainstem there (series 2, image 20). Associated T2 and FLAIR hyperintensity. No associated hemorrhage or mass effect. Stable to slightly faded restricted diffusion in the white matter adjacent to the right frontal horn on series 2, image 30 today. No new areas of restriction. Stable gray and white matter signal elsewhere with extensive white matter T2 and FLAIR hyperintensity, occasional cystic encephalomalacia of the white matter (left corona radiata series 9, image 18). No cortical encephalomalacia identified. Chronic lacunar infarcts in the bilateral thalami and right basal ganglia. Right thalamic chronic microhemorrhage. No  midline shift, mass effect, evidence of mass lesion, ventriculomegaly, extra-axial collection or acute intracranial hemorrhage. Cervicomedullary junction and pituitary are within normal limits. No abnormal enhancement identified. No dural thickening. Vascular: Major intracranial vascular flow voids are stable. Skull and upper cervical spine: Negative visible cervical spine, bone marrow signal. Sinuses/Orbits: Stable, negative. Other: Mastoids remain clear. Grossly normal visible internal auditory structures. Scalp and face appear negative. IMPRESSION: 1. Moderate size left pontine infarct with increased diffusion restriction compared to 3 days ago, but no associated hemorrhage or mass effect. 2. Stable to perhaps slightly faded acute white matter lacunar infarct adjacent to the right frontal lobe. 3. No new intracranial abnormality. Underlying advanced chronic small vessel disease. Electronically Signed   By: Genevie Ann M.D.   On: 06/08/2020 14:15   ECHO TEE  Result Date: 06/09/2020    TRANSESOPHOGEAL ECHO REPORT   Patient Name:   Aran Vendetti Date of Exam: 06/09/2020 Medical Rec #:  [id number]       Height:       70.0 in Accession #:    [id number]      Weight:       284.6 lb Date of Birth:  1951/09/30       BSA:          2.425 m Patient Age:    3 years        BP:           103/53 mmHg Patient Gender: M               HR:           78 bpm. Exam Location:  [facility] Procedure: Transesophageal Echo, Color Doppler, Cardiac Doppler and Saline            Contrast Bubble Study Indications:     Stroke 434.91  History:         Patient has prior history of Echocardiogram examinations, most                  recent 06/05/2020. Risk Factors:Diabetes and Hypertension.  Sonographer:     Sherrie Sport RDCS (AE) Referring Phys:  [place]ino [place] Diagnosing Phys: Ida Rogue MD PROCEDURE: After discussion of the risks and benefits of a TEE, an informed consent was obtained from the patient. TEE procedure time was 35 minutes. The  transesophogeal probe was passed without difficulty through the esophogus of the patient. Imaged were obtained with the patient in a left lateral decubitus position. Local oropharyngeal anesthetic was provided with Benzocaine spray and Cetacaine. Sedation performed by performing physician. Image quality was excellent. The patient's vital signs; including heart rate, blood pressure, and oxygen saturation; remained stable throughout the  procedure. The patient developed no complications during the procedure. IMPRESSIONS  1. Left ventricular ejection fraction, by estimation, is 55 %. The left ventricle has normal function. The left ventricle has no regional wall motion abnormalities.  2. Right ventricular systolic function is normal. The right ventricular size is normal.  3. No left atrial/left atrial appendage thrombus was detected.  4. Negative saline contrast bubble study, unable to test with valsalva. Conclusion(s)/Recommendation(s): Normal biventricular function without evidence of hemodynamically significant valvular heart disease. FINDINGS  Left Ventricle: Left ventricular ejection fraction, by estimation, is 55 to 60%. The left ventricle has normal function. The left ventricle has no regional wall motion abnormalities. The left ventricular internal cavity size was normal in size. There is  no left ventricular hypertrophy. Right Ventricle: The right ventricular size is normal. No increase in right ventricular wall thickness. Right ventricular systolic function is normal. Left Atrium: Left atrial size was normal in size. No left atrial/left atrial appendage thrombus was detected. Right Atrium: Right atrial size was normal in size. Pericardium: There is no evidence of pericardial effusion. Mitral Valve: The mitral valve is normal in structure. No evidence of mitral valve regurgitation. No evidence of mitral valve stenosis. Tricuspid Valve: The tricuspid valve is normal in structure. Tricuspid valve regurgitation is  not demonstrated. No evidence of tricuspid stenosis. Aortic Valve: The aortic valve is normal in structure. Aortic valve regurgitation is not visualized. No aortic stenosis is present. Pulmonic Valve: The pulmonic valve was normal in structure. Pulmonic valve regurgitation is not visualized. No evidence of pulmonic stenosis. Aorta: The aortic root is normal in size and structure. Venous: The inferior vena cava is normal in size with greater than 50% respiratory variability, suggesting right atrial pressure of 3 mmHg. IAS/Shunts: No atrial level shunt detected by color flow Doppler. Agitated saline contrast was given intravenously to evaluate for intracardiac shunting. There is no evidence of an atrial septal defect. Ida Rogue MD Electronically signed by Ida Rogue MD Signature Date/Time: 06/09/2020/5:05:41 PM    Final         Scheduled Meds: .  stroke: mapping our early stages of recovery book   Does not apply Once  . allopurinol  300 mg Oral Daily  . amLODipine  10 mg Oral Daily  . aspirin  300 mg Rectal Daily   Or  . aspirin EC  325 mg Oral Daily  . atorvastatin  80 mg Oral Daily  . cholecalciferol  1,000 Units Oral Daily  . finasteride  5 mg Oral Daily  . hydrocortisone cream   Topical Q M,W,F  . insulin aspart  0-20 Units Subcutaneous TID AC & HS  . losartan  100 mg Oral Daily  . Ensure Max Protein  11 oz Oral BID BM  . sodium chloride flush  3 mL Intravenous Once   Continuous Infusions:   LOS: 5 days    Time spent: 25 minutes    Sidney Ace, MD [facility] Pager [phone]  If 7PM-7AM, please contact night-coverage 06/10/2020, 11:17 AM

## 2020-06-10 NOTE — Progress Notes (Signed)
Speech Language Pathology Treatment: Cognitive-Linquistic  Patient Details Name: Peter Ryan MRN: [id number] DOB: Aug 03, 1951 Today's Date: 06/10/2020 Time: 7026-3785 SLP Time Calculation (min) (ACUTE ONLY): 35 min  Assessment / Plan / Recommendation Clinical Impression  Pt seen for ongoing treatment of dysarthria and education on general aspiration precautions as discussed at his BSE. Pt was given handouts and exercises for Dysarthria last session. Wife present in room. Pt appears min frustrated taht he is still in the hospital and wants to go to Rehab "to get started". Discussed pt's ability to start now using his Dysarthria exercises as given to him. Of note, on 06/08/2020, RN had concerns of patient w/ worsened neurologic exam and deficits; worsened right-sided facial droop, grip strength on right. MRI revealed "Moderate size left pontine infarct with increased diffusion restriction compared to 3 days ago but no associated hemorrhage or mass effect; Underlying advanced chronic small vessel disease".  At this session, pt had recently finished breakfast meal w/ no c/o difficulty swallowing; Wife agreed. Pt stated he was following general aspiration precautions and slowing down when eating/drinking but Wife disagreed w/ this. Reviewed and discussed dysphagia, aspiration precautions, risk for aspiration thus pulmonary decline w/ pt again. Reviewed and discussed his Articulation strategies - pt's R sided droop, decreased tone, did not look worse from initial evaluation and speech intelligibility was about the same w/ ~75+% intelligibility at sentence/conversation level. When pt utilized increased volume and effort, articulation precision increased more; pt and Wife agreed. Encouraged pt to get out the handouts on the exercises and practice these today, and everyday, as something he can start doing now as part of his Rehab while waiting for discharge.  Encouraged pt/Wife to reach out to SW/CM to get an  update on his discharge plan/options including CIR, other facilities. Both agreed. Recommend continuing w/ general aspiration precautions w/ all oral intake/meals; Dysarthria tx at next venue of care for Rehab.     HPI HPI: Pt is a 69 y.o. male with medical history significant for obesity, BPH, hypertension, diabetes mellitus, dyslipidemia and non-Hodgkin's lymphoma status post chemotherapy currently under surveillance who presents to the emergency room for evaluation of right-sided weakness which started 1 day prior to his presentation but was more pronounced on the day of admission when he got up to use the bathroom and fell on his bottom. Wife reports speech is slurred. Pt denies hitting his head when he fell. He denies having any difficulty swallowing, blurred vision or headache. MRI Findings (06/05/20): Acute left pontine and right frontal white matter infarcts. Severe chronic small vessel ischemic disease.       SLP Plan  Continue with current plan of care (rec. f/u at next venue of care for ongoing Rehab)       Recommendations  Diet recommendations: Regular;Thin liquid Liquids provided via: Cup;No straw Medication Administration: Whole meds with puree (for cohesive swallowing) Supervision: Patient able to self feed (tray setup d/t RUE weaknes) Compensations: Minimize environmental distractions;Slow rate;Small sips/bites;Lingual sweep for clearance of pocketing;Follow solids with liquid Postural Changes and/or Swallow Maneuvers: Seated upright 90 degrees;Upright 30-60 min after meal (Reflux precautions)                General recommendations:  (Dietician f/u) Oral Care Recommendations: Oral care BID;Oral care before and after PO;Patient independent with oral care (setup support) Follow up Recommendations: Inpatient Rehab (CIR) SLP Visit Diagnosis: Dysarthria and anarthria (R47.1) Plan: Continue with current plan of care (rec. f/u at next venue of care for ongoing Rehab)  GO                 Orinda Kenner, MS, CCC-SLP Speech Language Pathologist Rehab Services [phone] [facility] 06/10/2020, 11:01 AM

## 2020-06-10 NOTE — Progress Notes (Signed)
Physical Therapy Treatment Patient Details Name: Peter Ryan MRN: [id number] DOB: [birth year] Today's Date: 06/10/2020    History of Present Illness Peter Ryan is a 69 y.o. male with medical history significant for obesity, BPH, hypertension, diabetes mellitus, dyslipidemia and non-Hodgkin's lymphoma status post chemotherapy currently under surveillance who presented to the emergency room for evaluation of right-sided weakness that was progressing  prior to arrival.  Prior to admit he had a fall and needed EMS to assist patient back to bed.  He has not been able to ambulate since his fall and by all accounts signs and symptoms have progressively gotten worse since. His wife also states that his speech was slurred. Imaging reveals R pontine and L frontal infarcts.     PT Comments    Pt demonstrated excellent motivation during session today. He had more movement in his R UE/LE this session, and was a mod+2 for supine <> sit for truncal and BLE management instead of max+2. He continues to have poor to fair static sitting balance and difficulty maintaining upright and midline orientation. Patient performed sitting exercises on edge of bed with alternating hip marches and R heel slides. Patient cued for good posture and to keep shoulders back while sitting EOB. From sitting to supine, patient requires max +2 with help of trunk and LE placement. Pt performed therex for promotion of muscle activation, muscle strengthening, and improved awareness of RUE/RLE. Overall, pt had increased muscle control and spatial awareness today with increased strength in RUE and RLE. He will continue to benefit from skilled therapy to strength musculature and improve standing/sitting balance.   Follow Up Recommendations  CIR;SNF;Supervision/Assistance - 24 hour;Other (comment)     Equipment Recommendations  Other (comment) (TBD at next venue of care)    Recommendations for Other Services       Precautions /  Restrictions Precautions Precautions: Fall Restrictions Weight Bearing Restrictions: No Other Position/Activity Restrictions: protect weak RUE    Mobility  Bed Mobility Overal bed mobility: Needs Assistance Bed Mobility: Supine to Sit;Sit to Supine;Rolling Rolling: Mod assist (Verbal cues needed for proper UE placement)   Supine to sit: Max assist;+2 for physical assistance Sit to supine: Max assist;+2 for physical assistance   General bed mobility comments: required heavy max+2 A for truncal elevation and BLE control over and off of bed and for trunk/BLE management onto bed  Transfers Overall transfer level: Needs assistance   Transfers: Sit to/from Stand Sit to Stand: +2 physical assistance;Mod assist (Successful 2 attempts for standing)         General transfer comment: Mod +2 A for boosting to standing, balance while upright, and for eccentric control on descent. 3rd person present for equipment management  Ambulation/Gait             General Gait Details: not attempted; unsafe at this time   Stairs             Wheelchair Mobility    Modified Rankin (Stroke Patients Only)       Balance Overall balance assessment: Needs assistance Sitting-balance support: Single extremity supported;Feet supported Sitting balance-Leahy Scale: Poor Sitting balance - Comments: Pt with L and L posterolateral lean with static sitting requiring mod A to correct; patient tried sitting with no UE support but needed to use LUE to keep from falling backwards Postural control: Posterior lean;Left lateral lean Standing balance support: Bilateral upper extremity supported Standing balance-Leahy Scale: Poor Standing balance comment: max+2 A for upright balance during dynamic activity with pt unable  to attain full upright stance                            Cognition Arousal/Alertness: Awake/alert Behavior During Therapy: WFL for tasks assessed/performed Overall  Cognitive Status: Within Functional Limits for tasks assessed                                        Exercises General Exercises - Lower Extremity Heel Slides: AROM;Right;5 reps;Seated Hip ABduction/ADduction: 10 reps;AROM;Right Hip Flexion/Marching: Seated;AROM;15 reps Other Exercises Other Exercises: Pt performed shoulder shrugs x 10 with noted RUE deficit; pt with compensatory movements noted Other Exercises: In semi supine position: clamshells, R hip abd/add x 10 reps; scapular squeezes Other Exercises: Removed soiled chux pad and changed gown c min assist needed to help with R UE.    General Comments General comments (skin integrity, edema, etc.): Slurred speech still present, but has improved from yesterday      Pertinent Vitals/Pain Pain Assessment: No/denies pain    Home Living                      Prior Function            PT Goals (current goals can now be found in the care plan section) Acute Rehab PT Goals Patient Stated Goal: get back to walking and being able to do for himself PT Goal Formulation: With patient/family Time For Goal Achievement: 06/20/20 Potential to Achieve Goals: Fair Progress towards PT goals: Progressing toward goals    Frequency    7X/week      PT Plan Current plan remains appropriate    Co-evaluation              AM-PAC PT "6 Clicks" Mobility   Outcome Measure  Help needed turning from your back to your side while in a flat bed without using bedrails?: A Lot Help needed moving from lying on your back to sitting on the side of a flat bed without using bedrails?: A Lot Help needed moving to and from a bed to a chair (including a wheelchair)?: Total Help needed standing up from a chair using your arms (e.g., wheelchair or bedside chair)?: Total Help needed to walk in hospital room?: Total Help needed climbing 3-5 steps with a railing? : Total 6 Click Score: 8    End of Session Equipment Utilized  During Treatment: Gait belt Activity Tolerance: Patient tolerated treatment well Patient left: in bed;with call bell/phone within reach;with bed alarm set;with family/visitor present;with SCD's reapplied Nurse Communication: Mobility status PT Visit Diagnosis: Muscle weakness (generalized) (M62.81);Difficulty in walking, not elsewhere classified (R26.2);Other symptoms and signs involving the nervous system (R29.898);Hemiplegia and hemiparesis Hemiplegia - Right/Left: Right Hemiplegia - dominant/non-dominant: Dominant Hemiplegia - caused by: Cerebral infarction     Time: 9150-5697 PT Time Calculation (min) (ACUTE ONLY): 39 min  Charges:  $Therapeutic Exercise: 23-37 mins $Therapeutic Activity: 8-22 mins                     Kimmie Jacelyn Grip, SPT Bernita Raisin 06/10/2020, 1:45 PM

## 2020-06-10 NOTE — Plan of Care (Signed)
  Problem: Education: Goal: Knowledge of disease or condition will improve Outcome: Progressing   

## 2020-06-11 ENCOUNTER — Encounter: Payer: Self-pay | Admitting: Cardiovascular Disease

## 2020-06-11 LAB — GLUCOSE, CAPILLARY
Glucose-Capillary: 170 mg/dL — ABNORMAL HIGH (ref 70–99)
Glucose-Capillary: 171 mg/dL — ABNORMAL HIGH (ref 70–99)
Glucose-Capillary: 176 mg/dL — ABNORMAL HIGH (ref 70–99)
Glucose-Capillary: 180 mg/dL — ABNORMAL HIGH (ref 70–99)
Glucose-Capillary: 187 mg/dL — ABNORMAL HIGH (ref 70–99)
Glucose-Capillary: 197 mg/dL — ABNORMAL HIGH (ref 70–99)
Glucose-Capillary: 216 mg/dL — ABNORMAL HIGH (ref 70–99)

## 2020-06-11 NOTE — Progress Notes (Signed)
PROGRESS NOTE    Peter Ryan  TMH:[id number] DOB: December 18, 1950 DOA: 06/05/2020 PCP: [name]., MD  Brief Narrative:  Peter Ryan is a 69 y.o. male with medical history significant forobesity, BPH, hypertension, diabetes mellitus, dyslipidemia and non-Hodgkin's lymphoma status post chemotherapy currently under surveillance.  He presented to the hospital because of right-sided weakness that started on the day prior to admission.  He still felt weak in his right leg the following day and he fell on the floor when he tried to get up to use the bathroom.  His wife also noticed slurred speech.  MRI brain showed acute left pontine and right frontal white matter infarcts.  He was on low-dose aspirin at home and neurologist has recommended aspirin be increased to 325 mg daily.  9/8: Patient seen and examined.  Wife at bedside.  Complains of weakness.  9/9: Patient seen and examined.  Wife remains at bedside.  Lengthy conversation about slow GI bleed.  Hemoglobin has been stable.  At this time we elect to defer GI evaluation outpatient.  Patient and wife were given clear instructions regarding need for urgent medical attention should bleed acutely worsen.  Approximately 1230 received a call from bedside RN with concerns of patient had worsening neurologic exam and deficits.  Worsening right-sided facial droop.  Decreased grip strength on right.  Discussed with on-call neurologist who recommended stat MRI brain.  Ordered.  Currently pending  9/10: Patient seen and examined.  Patient's case discussed with Dr. Rockey Situ from cardiology.  TEE today.  No acute findings.  Cardiology recommended EP evaluation which can be deferred outpatient for consideration of a reveal device essentially an implantable loop recorder.  Discussed plan of care with patient and wife at bedside today.  Plan of care also discussed with neurology consultant.  9/11: Patient seen and examined.  TEE complete yesterday.   No acute findings.  Outpatient EP follow-up for reveal device consideration.  Plan remains to get patient to CIR.  Unfortunately is what happened over the weekend.  9/12: Patient seen and examined.  No acute events overnight.  Wife at bedside.  Neurologic exam slightly improved.   Assessment & Plan:   Principal Problem:   Acute CVA (cerebrovascular accident) [facility]) Active Problems:   Type 2 diabetes mellitus (HCC)   Benign essential hypertension   Obesity, Class III, BMI 40-49.9 (morbid obesity) ([place])  Acute CVA, left pontine acute infarction 2 days prior to admission Not a TPA candidate given last known time well MRI confirmed infarction CTA head and neck: No LVO EKG normal sinus rhythm Transthoracic echocardiogram reassuring 9/9: Worsening neurologic symptoms noted.  Repeat stat MRI brain ordered -Repeat MRI brain demonstrates increased diffusion-weighted signal without hemorrhage or mass-effect -TEE 9/10 did not reveal any PFO, ASD Plan: BP management, home medications restarted Continue aspirin 325 mg p.o. daily Continue Lipitor PT and OT as tolerated Frequent neuro checks SLP evaluation -Cardiology has arrange for outpatient evaluation by EP for consideration of implantable loop recorder, reveal device  Patient could be a good candidate for dual antiplatelet therapy can considering severe intracranial atherosclerosis.  Will defer this addition for now.  Patient is currently undergoing outpatient work-up for suspected GI bleed.  I recommend that the patient go back and see the gastroenterologist to achieve resolution on GI bleed.  Once this is achieved outpatient neurology can evaluate for need regarding dual antiplatelet therapy  Lower GI bleed Patient has had intermittent BRBPR Hemoglobin has been stable Rectal bleeding has been intermittent  and small-volume Could be hemorrhoidal versus possibly diverticular  Lengthy conversation with the patient and wife at bedside.   Explained that endoscopic evaluation likely involve a colonoscopy and associated bowel prep.  I offered to reach out to the gastroenterologist here.  At this time patient and wife are electing to defer this procedure to outpatient.  I feel this is acceptable choice.  Patient's hemoglobin has been relatively stable and given his recent infarct he is significantly debilitated.  Clear return to ED instructions have been provided should patient's bleed acutely worsen.   Hypertension Resume amlodipine, home dose 10 mg Resume losartan 100 mg nightly  Type 2 diabetes mellitus On sliding scale insulin before meals and at bedtime Can resume home regimen at discharge  Morbid obesity Dietitian consult  Gout Continue allopurinol  DVT prophylaxis: SCD Code Status: Full Family Communication: Wife at bedside Disposition Plan: Status is: Inpatient  Remains inpatient appropriate because:Unsafe d/c plan   Dispo: The patient is from: Home              Anticipated d/c is to: CIR              Anticipated d/c date is: 1 day              Patient currently is medically stable to d/c.   Patient currently medically stable for discharge.  Pending bed availability at CIR.         Consultants:   Neurology, cardiology  Procedures:   None  Antimicrobials:   None   Subjective: Patient seen and examined.  Persistent right-sided weakness endorsed  Objective: Vitals:   06/10/20 0749 06/10/20 1557 06/11/20 0111 06/11/20 0810  BP: (!) 144/67 (!) 148/68 (!) 149/78 (!) 152/75  Pulse: 69 78 79 70  Resp: 14 16 17 14   Temp: 98.5 F (36.9 C) 98.2 F (36.8 C) 98.2 F (36.8 C) 98.5 F (36.9 C)  TempSrc: Oral Oral Oral Oral  SpO2: 99% 97% 94% 96%  Weight:      Height:        Intake/Output Summary (Last 24 hours) at 06/11/2020 1218 Last data filed at 06/10/2020 1500 Gross per 24 hour  Intake --  Output 300 ml  Net -300 ml   Filed Weights   06/05/20 1051 06/09/20 0933  Weight: 129.1 kg  129.3 kg    Examination:  General: No apparent distress, patient appears well HEENT: Normocephalic, atraumatic Neck, supple, trachea midline, no tenderness Heart: Regular rate and rhythm, S1/S2 normal, no murmurs Lungs: Clear to auscultation bilaterally, no adventitious sounds, normal work of breathing Abdomen: Soft, nontender, nondistended, positive bowel sounds Extremities: Normal, atraumatic, no clubbing or cyanosis, normal muscle tone Skin: No rashes or lesions, normal color Neurologic: Right upper and lower extremity weakness, decreased grip strength on right, right-sided facial droop Psychiatric: Normal affect       Data Reviewed: I have personally reviewed following labs and imaging studies  CBC: Recent Labs  Lab 06/05/20 1100 06/07/20 1716 06/08/20 0507 06/09/20 0413 06/10/20 0407  WBC 11.4*  --  7.2 8.8 8.6  NEUTROABS 8.9*  --  4.4 5.2 5.5  HGB 11.9* 12.1* 11.7* 12.0* 11.8*  HCT 34.0*  --  32.8* 35.2* 33.5*  MCV 85.2  --  83.9 87.8 85.0  PLT 254  --  218 246 161   Basic Metabolic Panel: Recent Labs  Lab 06/05/20 1100  NA 140  K 3.4*  CL 103  CO2 22  GLUCOSE 252*  BUN 19  CREATININE 0.81  CALCIUM 8.6*   GFR: Estimated Creatinine Clearance: 119.5 mL/min (by C-G formula based on SCr of 0.81 mg/dL). Liver Function Tests: Recent Labs  Lab 06/05/20 1100  AST 27  ALT 21  ALKPHOS 56  BILITOT 0.7  PROT 7.2  ALBUMIN 3.9   No results for input(s): LIPASE, AMYLASE in the last 168 hours. No results for input(s): AMMONIA in the last 168 hours. Coagulation Profile: Recent Labs  Lab 06/05/20 1100  INR 1.0   Cardiac Enzymes: No results for input(s): CKTOTAL, CKMB, CKMBINDEX, TROPONINI in the last 168 hours. BNP (last 3 results) No results for input(s): PROBNP in the last 8760 hours. HbA1C: No results for input(s): HGBA1C in the last 72 hours. CBG: Recent Labs  Lab 06/10/20 2001 06/11/20 0110 06/11/20 0425 06/11/20 0811 06/11/20 1109    GLUCAP 198* 176* 180* 170* 197*   Lipid Profile: No results for input(s): CHOL, HDL, LDLCALC, TRIG, CHOLHDL, LDLDIRECT in the last 72 hours. Thyroid Function Tests: No results for input(s): TSH, T4TOTAL, FREET4, T3FREE, THYROIDAB in the last 72 hours. Anemia Panel: No results for input(s): VITAMINB12, FOLATE, FERRITIN, TIBC, IRON, RETICCTPCT in the last 72 hours. Sepsis Labs: No results for input(s): PROCALCITON, LATICACIDVEN in the last 168 hours.  Recent Results (from the past 240 hour(s))  SARS Coronavirus 2 by RT PCR (hospital order, performed in [facility] hospital lab) Nasopharyngeal Nasopharyngeal Swab     Status: None   Collection Time: 06/05/20  2:39 PM   Specimen: Nasopharyngeal Swab  Result Value Ref Range Status   SARS Coronavirus 2 NEGATIVE NEGATIVE Final    Comment: (NOTE) SARS-CoV-2 target nucleic acids are NOT DETECTED.  The SARS-CoV-2 RNA is generally detectable in upper and lower respiratory specimens during the acute phase of infection. The lowest concentration of SARS-CoV-2 viral copies this assay can detect is 250 copies / mL. A negative result does not preclude SARS-CoV-2 infection and should not be used as the sole basis for treatment or other patient management decisions.  A negative result may occur with improper specimen collection / handling, submission of specimen other than nasopharyngeal swab, presence of viral mutation(s) within the areas targeted by this assay, and inadequate number of viral copies (<250 copies / mL). A negative result must be combined with clinical observations, patient history, and epidemiological information.  Fact Sheet for Patients:   StrictlyIdeas.no  Fact Sheet for Healthcare Providers: BankingDealers.co.za  This test is not yet approved or  cleared by the [place] FDA and has been authorized for detection and/or diagnosis of SARS-CoV-2 by FDA under an Emergency Use  Authorization (EUA).  This EUA will remain in effect (meaning this test can be used) for the duration of the COVID-19 declaration under Section 564(b)(1) of the Act, 21 U.S.C. section 360bbb-3(b)(1), unless the authorization is terminated or revoked sooner.  Performed at [facility], [address], [place], [place] [address]          Radiology Studies: No results found.      Scheduled Meds: .  stroke: mapping our early stages of recovery book   Does not apply Once  . allopurinol  300 mg Oral Daily  . amLODipine  10 mg Oral Daily  . aspirin  300 mg Rectal Daily   Or  . aspirin EC  325 mg Oral Daily  . atorvastatin  80 mg Oral Daily  . cholecalciferol  1,000 Units Oral Daily  . finasteride  5 mg Oral Daily  . hydrocortisone cream  Topical Q M,W,F  . insulin aspart  0-20 Units Subcutaneous TID AC & HS  . losartan  100 mg Oral Daily  . Ensure Max Protein  11 oz Oral BID BM  . sodium chloride flush  3 mL Intravenous Once   Continuous Infusions:   LOS: 6 days    Time spent: 15 minutes    Sidney Ace, MD [facility] Pager [phone]  If 7PM-7AM, please contact night-coverage 06/11/2020, 12:18 PM

## 2020-06-11 NOTE — Progress Notes (Signed)
Physical Therapy Treatment Patient Details Name: Peter Ryan MRN: [id number] DOB: 11-21-1950 Today's Date: 06/11/2020    History of Present Illness Peter Ryan is a 69 y.o. male with medical history significant for obesity, BPH, hypertension, diabetes mellitus, dyslipidemia and non-Hodgkin's lymphoma status post chemotherapy currently under surveillance who presented to the emergency room for evaluation of right-sided weakness that was progressing  prior to arrival.  Prior to admit he had a fall and needed EMS to assist patient back to bed.  He has not been able to ambulate since his fall and by all accounts signs and symptoms have progressively gotten worse since. His wife also states that his speech was slurred. Imaging reveals R pontine and L frontal infarcts.     PT Comments    Pt in bed upon arrival with wife at bedside. Pt feeling very down today regarding and agreeable to bed level exercises. Pt offered emotional support. Pt had many questions about the status of his current condition, prognosis, and the likelihood of a full recovery. Education provided on continuing to work with PT and the goal is to get him as independent as possible regardless of physical condition. Clinician offered to connect with nursing to get an order for chaplain to come and visit however pt declines at this point. Pt performed open and closed chain BUE/BLE therex for promotion of muscle activation/strengthening. Pt with fair R elbow active movement today and is a slight improvement from yesterday. Pt noted to be soiled of urine and was through absorbant pads and sheets. Pt performed rolling side to side with mod-max A. Pt able to slide himself up in bed using LUE to pull on headboard and powering through BLE, with max support for RLE positioning. Pt continues to make progress each session and would greatly benefit from CIR level therapy to maximize functional mobility, independence, and safety with movements.       Follow Up Recommendations  CIR;SNF;Supervision/Assistance - 24 hour;Other (comment)     Equipment Recommendations  Other (comment) (TBD at next venue of care)    Recommendations for Other Services       Precautions / Restrictions Precautions Precautions: Fall Restrictions Weight Bearing Restrictions: No    Mobility  Bed Mobility Overal bed mobility: Needs Assistance Bed Mobility: Rolling Rolling: Mod assist;Max assist         General bed mobility comments: Mod A for rolling to R as pt able to initiate reach with LUE and power through LLE to come into sidelying position. Max A for rolling to L for initiation of movements and coming into sidelying position. Verbal and tactile cues for sequencing of tasks and placement of BUE/BLE during movements.  Transfers                 General transfer comment: deferred today  Ambulation/Gait             General Gait Details: not attempted   Stairs             Wheelchair Mobility    Modified Rankin (Stroke Patients Only)       Balance       Sitting balance - Comments: not performed       Standing balance comment: not performed                            Cognition Arousal/Alertness: Awake/alert Behavior During Therapy: WFL for tasks assessed/performed Overall Cognitive Status: Within Functional Limits for tasks  assessed                                        Exercises General Exercises - Upper Extremity Shoulder Flexion: Other (comment) Other Exercises Other Exercises: Pt performed shoulder shrugs x 10 with noted RUE deficit; pt with compensatory movements noted Other Exercises: pt performed open and closed chain BLE therex x 10 reps each; manual positioning of RLE needed occasionally and assistance needed to hold bilat feet in place during closed chain activities Other Exercises: pt's gown, sheets, and absorbant pads removed and exchanged with clean linen     General Comments General comments (skin integrity, edema, etc.): pt still with slurred speech but is intelligible      Pertinent Vitals/Pain Pain Assessment: No/denies pain    Home Living                      Prior Function            PT Goals (current goals can now be found in the care plan section) Acute Rehab PT Goals Patient Stated Goal: get back to walking and being able to do for himself PT Goal Formulation: With patient/family Time For Goal Achievement: 06/20/20 Potential to Achieve Goals: Fair Progress towards PT goals: Progressing toward goals    Frequency    7X/week      PT Plan Current plan remains appropriate    Co-evaluation              AM-PAC PT "6 Clicks" Mobility   Outcome Measure  Help needed turning from your back to your side while in a flat bed without using bedrails?: A Lot Help needed moving from lying on your back to sitting on the side of a flat bed without using bedrails?: A Lot Help needed moving to and from a bed to a chair (including a wheelchair)?: Total Help needed standing up from a chair using your arms (e.g., wheelchair or bedside chair)?: Total Help needed to walk in hospital room?: Total Help needed climbing 3-5 steps with a railing? : Total 6 Click Score: 8    End of Session   Activity Tolerance: Patient tolerated treatment well Patient left: in bed;with call bell/phone within reach;with bed alarm set;with family/visitor present Nurse Communication: Mobility status PT Visit Diagnosis: Muscle weakness (generalized) (M62.81);Difficulty in walking, not elsewhere classified (R26.2);Other symptoms and signs involving the nervous system (R29.898);Hemiplegia and hemiparesis Hemiplegia - Right/Left: Right Hemiplegia - dominant/non-dominant: Dominant Hemiplegia - caused by: Cerebral infarction     Time: 1055-1140 PT Time Calculation (min) (ACUTE ONLY): 45 min  Charges:                        Vale Haven,  SPT   Vale Haven 06/11/2020, 1:03 PM

## 2020-06-12 ENCOUNTER — Telehealth: Payer: Self-pay | Admitting: Cardiology

## 2020-06-12 LAB — GLUCOSE, CAPILLARY
Glucose-Capillary: 163 mg/dL — ABNORMAL HIGH (ref 70–99)
Glucose-Capillary: 167 mg/dL — ABNORMAL HIGH (ref 70–99)
Glucose-Capillary: 168 mg/dL — ABNORMAL HIGH (ref 70–99)
Glucose-Capillary: 178 mg/dL — ABNORMAL HIGH (ref 70–99)
Glucose-Capillary: 179 mg/dL — ABNORMAL HIGH (ref 70–99)
Glucose-Capillary: 189 mg/dL — ABNORMAL HIGH (ref 70–99)

## 2020-06-12 MED ORDER — SENNOSIDES-DOCUSATE SODIUM 8.6-50 MG PO TABS
1.0000 | ORAL_TABLET | Freq: Two times a day (BID) | ORAL | Status: DC
  Administered 2020-06-12 – 2020-06-14 (×5): 1 via ORAL
  Filled 2020-06-12 (×5): qty 1

## 2020-06-12 MED ORDER — BISACODYL 10 MG RE SUPP: 10.0000 mg | Freq: Every day | RECTAL | Status: DC | PRN

## 2020-06-12 MED ORDER — POLYETHYLENE GLYCOL 3350 17 G PO PACK
17.0000 g | PACK | Freq: Every day | ORAL | Status: DC
  Administered 2020-06-12 – 2020-06-14 (×3): 17 g via ORAL
  Filled 2020-06-12 (×3): qty 1

## 2020-06-12 NOTE — Progress Notes (Addendum)
Occupational Therapy Treatment Patient Details Name: Peter Ryan MRN: [id number] DOB: 11-22-1950 Today's Date: 06/12/2020    History of present illness Peter Ryan is a 69 y.o. male with medical history significant for obesity, BPH, hypertension, diabetes mellitus, dyslipidemia and non-Hodgkin's lymphoma status post chemotherapy currently under surveillance who presented to the emergency room for evaluation of right-sided weakness that was progressing  prior to arrival.  Prior to admit he had a fall and needed EMS to assist patient back to bed.  He has not been able to ambulate since his fall and by all accounts signs and symptoms have progressively gotten worse since. His wife also states that his speech was slurred. Imaging reveals R pontine and L frontal infarcts.    OT comments  Pt seen for OT treatment on this date. Upon arrival to room pt awake and alert, upright in bed with wife present. Pt A&O x 4, reporting fatigue but no pain. Pt agreeable to tx. Engaged in therapeutic exercises at EOB. Max A x 2 required for pt to transition supine<>EOB. Pt initially required Mod A for sitting balance, with a L lateral lean, but then progressed to CGA with L UE supported in static sitting, reverting back to Mod A when engaging in functional reaching activities. Pt participated in AAROM UE exercises, 10 repetitions X 2 sets of raising/lower R UE with L UE assist. With assist from PTs for safety, engaged pt in standing with Mod A and +3. Pt making good progress toward goals. Pt continues to benefit from skilled OT services to maximize return to PLOF. Will continue to follow POC. Discharge recommendation remains appropriate.    Follow Up Recommendations  CIR    Equipment Recommendations  Other (comment)    Recommendations for Other Services      Precautions / Restrictions Precautions Precautions: Fall Restrictions Weight Bearing Restrictions: No Other Position/Activity Restrictions: protect  weak RUE       Mobility Bed Mobility Overal bed mobility: Needs Assistance   Supine to sit: Max assist;+2 for physical assistance Sit to supine: Max assist;+2 for physical assistance   General bed mobility comments: Verbal and tactile cues required for placement of BUE/BLU during movements/  Transfers Overall transfer level: Needs assistance   Transfers: Sit to/from Stand Sit to Stand: Mod assist (+3)         General transfer comment: deferred today    Balance Overall balance assessment: Needs assistance Sitting-balance support: Single extremity supported;Feet supported Sitting balance-Leahy Scale: Fair Sitting balance - Comments: Mod A for initial sitting balance, with L lateral lean, pt then able to maintain sitting balance with CGA without challenges, reverting back to Mod A with functional reaching or perturbations Postural control: Posterior lean;Left lateral lean   Standing balance-Leahy Scale: Poor                             ADL either performed or assessed with clinical judgement   ADL Overall ADL's : Needs assistance/impaired                                       General ADL Comments: MAX A with LB ADLs in sitting, MIN/MOD A with UB ADLs in sitting. MOD +2 for SPS arm in arm t/f's     Vision Baseline Vision/History: Wears glasses Wears Glasses: At all times Patient Visual Report: No change from baseline  Eye Alignment: Within Functional Limits Ocular Range of Motion: Within Functional Limits Alignment/Gaze Preference: Within Defined Limits Tracking/Visual Pursuits: Able to track stimulus in all quads without difficulty Convergence: Within functional limits   Perception     Praxis      Cognition Arousal/Alertness: Awake/alert Behavior During Therapy: WFL for tasks assessed/performed;Flat affect Overall Cognitive Status: Within Functional Limits for tasks assessed                                           Exercises Other Exercises Other Exercises: pt engaged in AAROM UE exercises, using LUE to lift R, repetitions of 10 x 2 sets; weight-bearing into RUE; finger exercises -- able to extend thumb, only minimal flexion with four remaining fingers   Shoulder Instructions       General Comments      Pertinent Vitals/ Pain       Pain Assessment: No/denies pain  Home Living                                          Prior Functioning/Environment              Frequency  Min 2X/week        Progress Toward Goals  OT Goals(current goals can now be found in the care plan section)  Progress towards OT goals: Progressing toward goals  Acute Rehab OT Goals Patient Stated Goal: get back to walking and being able to do for himself OT Goal Formulation: With patient Time For Goal Achievement: 06/20/20 Potential to Achieve Goals: Good  Plan Discharge plan remains appropriate    Co-evaluation                 AM-PAC OT "6 Clicks" Daily Activity     Outcome Measure   Help from another person eating meals?: A Little Help from another person taking care of personal grooming?: A Little Help from another person toileting, which includes using toliet, bedpan, or urinal?: A Lot Help from another person bathing (including washing, rinsing, drying)?: A Lot Help from another person to put on and taking off regular upper body clothing?: A Little Help from another person to put on and taking off regular lower body clothing?: A Lot 6 Click Score: 15    End of Session Equipment Utilized During Treatment: Gait belt  OT Visit Diagnosis: Unsteadiness on feet (R26.81);Muscle weakness (generalized) (M62.81);Other symptoms and signs involving the nervous system (R29.898);Hemiplegia and hemiparesis Hemiplegia - Right/Left: Right Hemiplegia - dominant/non-dominant: Dominant Hemiplegia - caused by: Cerebral infarction   Activity Tolerance Patient tolerated treatment well    Patient Left in bed;with family/visitor present (with PTs in room)   Nurse Communication          Time: 0017-4944 OT Time Calculation (min): 22 min  Charges: OT General Charges $OT Visit: 1 Visit OT Treatments $Therapeutic Activity: 8-22 mins  Josiah Lobo, PhD, [place], OTR/L ascom [phone] 06/12/20, 4:19 PM

## 2020-06-12 NOTE — Telephone Encounter (Signed)
Attempted to schedule.  LMOV to call office.  ° °

## 2020-06-12 NOTE — Progress Notes (Signed)
Physical Therapy Treatment Patient Details Name: Peter Ryan MRN: [id number] DOB: Mar 21, 1951 Today's Date: 06/12/2020    History of Present Illness Peter Ryan is a 69 y.o. male with medical history significant for obesity, BPH, hypertension, diabetes mellitus, dyslipidemia and non-Hodgkin's lymphoma status post chemotherapy currently under surveillance who presented to the emergency room for evaluation of right-sided weakness that was progressing  prior to arrival.  Prior to admit he had a fall and needed EMS to assist patient back to bed.  He has not been able to ambulate since his fall and by all accounts signs and symptoms have progressively gotten worse since. His wife also states that his speech was slurred. Imaging reveals R pontine and L frontal infarcts.     PT Comments    Pt demonstrated excellent motivation during session today. With assist from OTs for safety, engaged pt in standing with Mod A and +3. He was able to stand for 10-15 seconds x 2 reps. He continues to have poor to fair static sitting balance and difficulty maintaing upright and midline orientation. Patient performed sitting exercises on edge of bed with hip marches, modified clamshells with manual resistance, and hip adductions with manual resistance BLE. From sitting to supine, patient requires max +2 with help of trunk and LE placement. Pt performed therex for promotion of muscle activation, muscle strengthening, and improved awareness of RUE/RLE. Verbal cued utilized for proper UE/LE placement and technique for exercises.  Patient's strength fluctuates between session due to level of fatigue, but is still making good progress toward goals. Pt continues to benefit from skilled PT services to maximize return to PLOF. Will continue to follow POC. Discharge recommendation remains appropriate.    Follow Up Recommendations  CIR;SNF;Supervision/Assistance - 24 hour;Other (comment)     Equipment Recommendations        Recommendations for Other Services       Precautions / Restrictions Precautions Precautions: Fall Restrictions Weight Bearing Restrictions: No Other Position/Activity Restrictions: protect weak RUE    Mobility  Bed Mobility Overal bed mobility: Needs Assistance Bed Mobility: Rolling;Sit to Supine Rolling: Mod assist   Supine to sit: Max assist;+2 for physical assistance Sit to supine: +2 for physical assistance;Max assist   General bed mobility comments: During rolling, patient cued to reach for bed rail and needed min assistance to roll onto R side; He is still mod assist to roll onto L side due to R sided weakness; Verbal and tacile cues required for placement of BUE/BLE during movements  Transfers Overall transfer level: Needs assistance Equipment used: None Transfers: Sit to/from Stand Sit to Stand: Mod assist;+2 physical assistance         General transfer comment: For sit to stand transfer, pt requires mod +3 for transfer. He was able to stand twice for about 10-15 seconds and was cued to stand up right and squeeze glutes to help stand all the way up.   Ambulation/Gait             General Gait Details: not attempted   Stairs             Wheelchair Mobility    Modified Rankin (Stroke Patients Only)       Balance Overall balance assessment: Needs assistance Sitting-balance support: Single extremity supported;Feet supported Sitting balance-Leahy Scale: Fair Sitting balance - Comments: Mod A for sitting balance, with posterior and L lateral lean; Patient cued to sit up straight  Postural control: Posterior lean;Left lateral lean Standing balance support: Bilateral upper extremity supported  Standing balance-Leahy Scale: Poor Standing balance comment: Pt relied on +3 to stand for 10-15 seconds. He was holding onto SPTs arms and leaning back into OTs hands as she was pushing his hips forward. He struggled to maintain standing on his own throughout the  time in standing.                            Cognition Arousal/Alertness: Awake/alert Behavior During Therapy: WFL for tasks assessed/performed;Flat affect Overall Cognitive Status: Within Functional Limits for tasks assessed                                        Exercises General Exercises - Lower Extremity Short Arc Quad: AROM;10 reps;Right;Supine Long Arc Quad: AROM;10 reps;Both;Seated Hip ABduction/ADduction: 10 reps;AROM;Right;Supine Hip Flexion/Marching: AROM;Both;10 reps;Seated Other Exercises Other Exercises: Pt performed scap squeezes pushing into bed x 10, shoulder shrugs x 10 with noted RUE deficit; pt with compensatory movements noted; glute bridges to move higher in the bed x 5 reps Other Exercises: Supine hip marches with eccentric resistance x 10 reps on R LE Other Exercises: pt engaged in AAROM UE exercises, using LUE to lift R, repetitions of 10 x 2 sets; weight-bearing into RUE; finger exercises -- able to extend thumb, only minimal flexion with four remaining fingers    General Comments        Pertinent Vitals/Pain Pain Assessment: No/denies pain    Home Living                      Prior Function            PT Goals (current goals can now be found in the care plan section) Acute Rehab PT Goals Patient Stated Goal: Get back to walking PT Goal Formulation: With patient/family Time For Goal Achievement: 06/20/20 Potential to Achieve Goals: Fair Progress towards PT goals: Progressing toward goals    Frequency    7X/week      PT Plan Current plan remains appropriate    Co-evaluation              AM-PAC PT "6 Clicks" Mobility   Outcome Measure  Help needed turning from your back to your side while in a flat bed without using bedrails?: A Lot Help needed moving from lying on your back to sitting on the side of a flat bed without using bedrails?: A Lot Help needed moving to and from a bed to a chair  (including a wheelchair)?: Total Help needed standing up from a chair using your arms (e.g., wheelchair or bedside chair)?: Total Help needed to walk in hospital room?: Total Help needed climbing 3-5 steps with a railing? : Total 6 Click Score: 8    End of Session Equipment Utilized During Treatment: Gait belt Activity Tolerance: Patient tolerated treatment well Patient left: in bed;with call bell/phone within reach;with bed alarm set;with family/visitor present Nurse Communication: Mobility status PT Visit Diagnosis: Muscle weakness (generalized) (M62.81);Difficulty in walking, not elsewhere classified (R26.2);Other symptoms and signs involving the nervous system (R29.898);Hemiplegia and hemiparesis Hemiplegia - Right/Left: Right Hemiplegia - dominant/non-dominant: Dominant Hemiplegia - caused by: Cerebral infarction     Time: 9622-2979 PT Time Calculation (min) (ACUTE ONLY): 32 min  Charges:  $Therapeutic Exercise: 8-22 mins $Therapeutic Activity: 8-22 mins  Noemi Chapel, SPT Bernita Raisin 06/12/2020, 5:09 PM

## 2020-06-12 NOTE — Progress Notes (Signed)
PROGRESS NOTE    Peter Ryan  ZOX:[id number] DOB: [birth year] DOA: 06/05/2020 PCP: [name]., MD  Brief Narrative:  Peter Ryan is a 69 y.o. male with medical history significant forobesity, BPH, hypertension, diabetes mellitus, dyslipidemia and non-Hodgkin's lymphoma status post chemotherapy currently under surveillance.  He presented to the hospital because of right-sided weakness that started on the day prior to admission.  He still felt weak in his right leg the following day and he fell on the floor when he tried to get up to use the bathroom.  His wife also noticed slurred speech.  MRI brain showed acute left pontine and right frontal white matter infarcts.  He was on low-dose aspirin at home and neurologist has recommended aspirin be increased to 325 mg daily.  9/8: Patient seen and examined.  Wife at bedside.  Complains of weakness.  9/9: Patient seen and examined.  Wife remains at bedside.  Lengthy conversation about slow GI bleed.  Hemoglobin has been stable.  At this time we elect to defer GI evaluation outpatient.  Patient and wife were given clear instructions regarding need for urgent medical attention should bleed acutely worsen.  Approximately 1230 received a call from bedside RN with concerns of patient had worsening neurologic exam and deficits.  Worsening right-sided facial droop.  Decreased grip strength on right.  Discussed with on-call neurologist who recommended stat MRI brain.  Ordered.  Currently pending  9/10: Patient seen and examined.  Patient's case discussed with Dr. Rockey Situ from cardiology.  TEE today.  No acute findings.  Cardiology recommended EP evaluation which can be deferred outpatient for consideration of a reveal device essentially an implantable loop recorder.  Discussed plan of care with patient and wife at bedside today.  Plan of care also discussed with neurology consultant.  9/11: Patient seen and examined.  TEE complete yesterday.   No acute findings.  Outpatient EP follow-up for reveal device consideration.  Plan remains to get patient to CIR.  Unfortunately is what happened over the weekend.  9/12: Patient seen and examined.  No acute events overnight.  Wife at bedside.  Neurologic exam slightly improved.  9/13: Patient seen and examined.  No acute events overnight.  Wife at bedside.  Neurologic exam slightly improved.  Grip strength on the right improving.  No beds available at CIR today   Assessment & Plan:   Principal Problem:   Acute CVA (cerebrovascular accident) [facility]) Active Problems:   Type 2 diabetes mellitus (HCC)   Benign essential hypertension   Obesity, Class III, BMI 40-49.9 (morbid obesity) ([place])  Acute CVA, left pontine acute infarction 2 days prior to admission Not a TPA candidate given last known time well MRI confirmed infarction CTA head and neck: No LVO EKG normal sinus rhythm Transthoracic echocardiogram reassuring 9/9: Worsening neurologic symptoms noted.  Repeat stat MRI brain ordered -Repeat MRI brain demonstrates increased diffusion-weighted signal without hemorrhage or mass-effect -TEE 9/10 did not reveal any PFO, ASD Plan: BP management, home medications restarted Continue aspirin 325 mg p.o. daily Continue Lipitor PT and OT as tolerated Frequent neuro checks SLP evaluation -Cardiology has arrange for outpatient evaluation by EP for consideration of implantable loop recorder, reveal device  Patient could be a good candidate for dual antiplatelet therapy can considering severe intracranial atherosclerosis.  Will defer this addition for now.  Patient is currently undergoing outpatient work-up for suspected GI bleed.  I recommend that the patient go back and see the gastroenterologist to achieve resolution on GI  bleed.  Once this is achieved outpatient neurology can evaluate for need regarding dual antiplatelet therapy  Lower GI bleed Patient has had intermittent BRBPR Hemoglobin  has been stable Rectal bleeding has been intermittent and small-volume Could be hemorrhoidal versus possibly diverticular  Lengthy conversation with the patient and wife at bedside.  Explained that endoscopic evaluation likely involve a colonoscopy and associated bowel prep.  I offered to reach out to the gastroenterologist here.  At this time patient and wife are electing to defer this procedure to outpatient.  I feel this is acceptable choice.  Patient's hemoglobin has been relatively stable and given his recent infarct he is significantly debilitated.  Clear return to ED instructions have been provided should patient's bleed acutely worsen.   Hypertension Resume amlodipine, home dose 10 mg Resume losartan 100 mg nightly  Type 2 diabetes mellitus On sliding scale insulin before meals and at bedtime Can resume home regimen at discharge  Morbid obesity Dietitian consult  Gout Continue allopurinol  DVT prophylaxis: SCD Code Status: Full Family Communication: Wife at bedside Disposition Plan: Status is: Inpatient  Remains inpatient appropriate because:Unsafe d/c plan   Dispo: The patient is from: Home              Anticipated d/c is to: CIR              Anticipated d/c date is: 1 day              Patient currently is medically stable to d/c.   Patient currently medically stable for discharge.  Pending bed availability at CIR.         Consultants:   Neurology, cardiology  Procedures:   None  Antimicrobials:   None   Subjective: Patient seen and examined.  Complains of decreased grip strength on right and inability to raise arm.  Objective: Vitals:   06/11/20 0810 06/11/20 1536 06/11/20 2339 06/12/20 0831  BP: (!) 152/75 (!) 147/71 (!) 152/72 (!) 152/72  Pulse: 70 82 79 71  Resp: 14 18 17 18   Temp: 98.5 F (36.9 C) 97.8 F (36.6 C) 97.8 F (36.6 C) 97.7 F (36.5 C)  TempSrc: Oral Oral Oral Axillary  SpO2: 96% 95% 95% 95%  Weight:      Height:         No intake or output data in the 24 hours ending 06/12/20 1038 Filed Weights   06/05/20 1051 06/09/20 0933  Weight: 129.1 kg 129.3 kg    Examination:  General: No apparent distress, patient appears well HEENT: Normocephalic, atraumatic Neck, supple, trachea midline, no tenderness Heart: Regular rate and rhythm, S1/S2 normal, no murmurs Lungs: Clear to auscultation bilaterally, no adventitious sounds, normal work of breathing Abdomen: Soft, nontender, nondistended, positive bowel sounds Extremities: Normal, atraumatic, no clubbing or cyanosis, normal muscle tone Skin: No rashes or lesions, normal color Neurologic: Right upper and lower extremity weakness.  Decreased grip strength on right.  Right-sided facial droop.   psychiatric: Normal affect        Data Reviewed: I have personally reviewed following labs and imaging studies  CBC: Recent Labs  Lab 06/05/20 1100 06/07/20 1716 06/08/20 0507 06/09/20 0413 06/10/20 0407  WBC 11.4*  --  7.2 8.8 8.6  NEUTROABS 8.9*  --  4.4 5.2 5.5  HGB 11.9* 12.1* 11.7* 12.0* 11.8*  HCT 34.0*  --  32.8* 35.2* 33.5*  MCV 85.2  --  83.9 87.8 85.0  PLT 254  --  218 246 236  Basic Metabolic Panel: Recent Labs  Lab 06/05/20 1100  NA 140  K 3.4*  CL 103  CO2 22  GLUCOSE 252*  BUN 19  CREATININE 0.81  CALCIUM 8.6*   GFR: Estimated Creatinine Clearance: 119.5 mL/min (by C-G formula based on SCr of 0.81 mg/dL). Liver Function Tests: Recent Labs  Lab 06/05/20 1100  AST 27  ALT 21  ALKPHOS 56  BILITOT 0.7  PROT 7.2  ALBUMIN 3.9   No results for input(s): LIPASE, AMYLASE in the last 168 hours. No results for input(s): AMMONIA in the last 168 hours. Coagulation Profile: Recent Labs  Lab 06/05/20 1100  INR 1.0   Cardiac Enzymes: No results for input(s): CKTOTAL, CKMB, CKMBINDEX, TROPONINI in the last 168 hours. BNP (last 3 results) No results for input(s): PROBNP in the last 8760 hours. HbA1C: No results for  input(s): HGBA1C in the last 72 hours. CBG: Recent Labs  Lab 06/11/20 1537 06/11/20 1955 06/11/20 2340 06/12/20 0458 06/12/20 0800  GLUCAP 187* 216* 171* 189* 167*   Lipid Profile: No results for input(s): CHOL, HDL, LDLCALC, TRIG, CHOLHDL, LDLDIRECT in the last 72 hours. Thyroid Function Tests: No results for input(s): TSH, T4TOTAL, FREET4, T3FREE, THYROIDAB in the last 72 hours. Anemia Panel: No results for input(s): VITAMINB12, FOLATE, FERRITIN, TIBC, IRON, RETICCTPCT in the last 72 hours. Sepsis Labs: No results for input(s): PROCALCITON, LATICACIDVEN in the last 168 hours.  Recent Results (from the past 240 hour(s))  SARS Coronavirus 2 by RT PCR (hospital order, performed in [facility] hospital lab) Nasopharyngeal Nasopharyngeal Swab     Status: None   Collection Time: 06/05/20  2:39 PM   Specimen: Nasopharyngeal Swab  Result Value Ref Range Status   SARS Coronavirus 2 NEGATIVE NEGATIVE Final    Comment: (NOTE) SARS-CoV-2 target nucleic acids are NOT DETECTED.  The SARS-CoV-2 RNA is generally detectable in upper and lower respiratory specimens during the acute phase of infection. The lowest concentration of SARS-CoV-2 viral copies this assay can detect is 250 copies / mL. A negative result does not preclude SARS-CoV-2 infection and should not be used as the sole basis for treatment or other patient management decisions.  A negative result may occur with improper specimen collection / handling, submission of specimen other than nasopharyngeal swab, presence of viral mutation(s) within the areas targeted by this assay, and inadequate number of viral copies (<250 copies / mL). A negative result must be combined with clinical observations, patient history, and epidemiological information.  Fact Sheet for Patients:   StrictlyIdeas.no  Fact Sheet for Healthcare Providers: BankingDealers.co.za  This test is not yet approved  or  cleared by the [place] FDA and has been authorized for detection and/or diagnosis of SARS-CoV-2 by FDA under an Emergency Use Authorization (EUA).  This EUA will remain in effect (meaning this test can be used) for the duration of the COVID-19 declaration under Section 564(b)(1) of the Act, 21 U.S.C. section 360bbb-3(b)(1), unless the authorization is terminated or revoked sooner.  Performed at [facility], [address] [place], [place] [address]          Radiology Studies: No results found.      Scheduled Meds: .  stroke: mapping our early stages of recovery book   Does not apply Once  . allopurinol  300 mg Oral Daily  . amLODipine  10 mg Oral Daily  . aspirin  300 mg Rectal Daily   Or  . aspirin EC  325 mg Oral Daily  .  atorvastatin  80 mg Oral Daily  . cholecalciferol  1,000 Units Oral Daily  . finasteride  5 mg Oral Daily  . hydrocortisone cream   Topical Q M,W,F  . insulin aspart  0-20 Units Subcutaneous TID AC & HS  . losartan  100 mg Oral Daily  . polyethylene glycol  17 g Oral Daily  . Ensure Max Protein  11 oz Oral BID BM  . senna-docusate  1 tablet Oral BID  . sodium chloride flush  3 mL Intravenous Once   Continuous Infusions:   LOS: 7 days    Time spent: 15 minutes    Sidney Ace, MD [facility] Pager [phone]  If 7PM-7AM, please contact night-coverage 06/12/2020, 10:38 AM

## 2020-06-12 NOTE — Progress Notes (Signed)
Inpatient Rehab Admissions Coordinator:   Note pt's progress with therapy (mod +2 for transfers with PT over the weekend), and pt continues to be appropriate for CIR. I have no beds available for this patient to admit to CIR today.  Will continue to follow for timing of potential admission pending bed availability.   Shann Medal, PT, DPT Admissions Coordinator [phone] 06/12/20  10:31 AM

## 2020-06-12 NOTE — Care Management Important Message (Signed)
Important Message  Patient Details  Name: Peter Ryan MRN: [id number] Date of Birth: 13-Jun-1951   Medicare Important Message Given:  Yes     [name] 06/12/2020, 12:00 PM

## 2020-06-12 NOTE — Telephone Encounter (Signed)
°  Patient Consent for Virtual Visit         Peter Ryan has provided verbal consent on 06/12/2020 for a virtual visit (video or telephone).   CONSENT FOR VIRTUAL VISIT FOR:  Peter Ryan  By participating in this virtual visit I agree to the following:  I hereby voluntarily request, consent and authorize [facility] and its employed or contracted physicians, physician assistants, nurse practitioners or other licensed health care professionals (the Practitioner), to provide me with telemedicine health care services (the Services") as deemed necessary by the treating Practitioner. I acknowledge and consent to receive the Services by the Practitioner via telemedicine. I understand that the telemedicine visit will involve communicating with the Practitioner through live audiovisual communication technology and the disclosure of certain medical information by electronic transmission. I acknowledge that I have been given the opportunity to request an in-person assessment or other available alternative prior to the telemedicine visit and am voluntarily participating in the telemedicine visit.  I understand that I have the right to withhold or withdraw my consent to the use of telemedicine in the course of my care at any time, without affecting my right to future care or treatment, and that the Practitioner or I may terminate the telemedicine visit at any time. I understand that I have the right to inspect all information obtained and/or recorded in the course of the telemedicine visit and may receive copies of available information for a reasonable fee.  I understand that some of the potential risks of receiving the Services via telemedicine include:   Delay or interruption in medical evaluation due to technological equipment failure or disruption;  Information transmitted may not be sufficient (e.g. poor resolution of images) to allow for appropriate medical decision making by the Practitioner;  and/or   In rare instances, security protocols could fail, causing a breach of personal health information.  Furthermore, I acknowledge that it is my responsibility to provide information about my medical history, conditions and care that is complete and accurate to the best of my ability. I acknowledge that Practitioner's advice, recommendations, and/or decision may be based on factors not within their control, such as incomplete or inaccurate data provided by me or distortions of diagnostic images or specimens that may result from electronic transmissions. I understand that the practice of medicine is not an exact science and that Practitioner makes no warranties or guarantees regarding treatment outcomes. I acknowledge that a copy of this consent can be made available to me via my patient portal ([facility]), or I can request a printed copy by calling the office of [facility].    I understand that my insurance will be billed for this visit.   I have read or had this consent read to me.  I understand the contents of this consent, which adequately explains the benefits and risks of the Services being provided via telemedicine.   I have been provided ample opportunity to ask questions regarding this consent and the Services and have had my questions answered to my satisfaction.  I give my informed consent for the services to be provided through the use of telemedicine in my medical care

## 2020-06-13 LAB — GLUCOSE, CAPILLARY
Glucose-Capillary: 183 mg/dL — ABNORMAL HIGH (ref 70–99)
Glucose-Capillary: 189 mg/dL — ABNORMAL HIGH (ref 70–99)
Glucose-Capillary: 197 mg/dL — ABNORMAL HIGH (ref 70–99)
Glucose-Capillary: 217 mg/dL — ABNORMAL HIGH (ref 70–99)
Glucose-Capillary: 219 mg/dL — ABNORMAL HIGH (ref 70–99)

## 2020-06-13 NOTE — Progress Notes (Signed)
Physical Therapy Treatment Patient Details Name: Peter Ryan MRN: [id number] DOB: 19-Aug-1951 Today's Date: 06/13/2020    History of Present Illness Peter Ryan is a 69 y.o. male with medical history significant for obesity, BPH, hypertension, diabetes mellitus, dyslipidemia and non-Hodgkin's lymphoma status post chemotherapy currently under surveillance who presented to the emergency room for evaluation of right-sided weakness that was progressing  prior to arrival.  Prior to admit he had a fall and needed EMS to assist patient back to bed.  He has not been able to ambulate since his fall and by all accounts signs and symptoms have progressively gotten worse since. His wife also states that his speech was slurred. Imaging reveals R pontine and L frontal infarcts.     PT Comments    Patient received in bed, agrees to PT session. He reports some improvement on his right side. Requires max +2 assist for supine to sit to raise trunk up and for scooting hips. Max +2 for sit to stand from bed in low position. He requires mod assist with standing to straighten up and for blocking right knee. He was able to pivot transfer to recliner with mod/max +2 assist. Patient will continue to benefit from skilled PT while here to improve strength, functional independence and safety with mobility.      Follow Up Recommendations  CIR;SNF;Supervision/Assistance - 24 hour;Other (comment)     Equipment Recommendations  Other (comment) (TBD)    Recommendations for Other Services       Precautions / Restrictions Precautions Precautions: Fall Restrictions Weight Bearing Restrictions: No    Mobility  Bed Mobility Overal bed mobility: Needs Assistance Bed Mobility: Supine to Sit     Supine to sit: Mod assist;+2 for physical assistance     General bed mobility comments: Patient requires mod +2 assist for raising trunk to seated position and to scoot hips forward in sitting.  Transfers Overall  transfer level: Needs assistance Equipment used: None Transfers: Stand Pivot Transfers;Sit to/from Stand Sit to Stand: Mod assist;+2 physical assistance Stand pivot transfers: Mod assist;+2 physical assistance       General transfer comment: sit to stand transfer from bed with left hand on arm rest of recliner and mod assist +2. Stand pivot to recliner with +2 mod assist to his left side.  Ambulation/Gait             General Gait Details: not attempted   Stairs             Wheelchair Mobility    Modified Rankin (Stroke Patients Only) Modified Rankin (Stroke Patients Only) Pre-Morbid Rankin Score: No symptoms Modified Rankin: Moderately severe disability     Balance Overall balance assessment: Needs assistance Sitting-balance support: Feet supported;Single extremity supported Sitting balance-Leahy Scale: Good Sitting balance - Comments: independent sitting balance   Standing balance support: Single extremity supported;During functional activity   Standing balance comment: Requires cues to straighten up and right knee blocking in standing                            Cognition Arousal/Alertness: Awake/alert Behavior During Therapy: WFL for tasks assessed/performed Overall Cognitive Status: Within Functional Limits for tasks assessed                                        Exercises Total Joint Exercises Ankle Circles/Pumps: PROM;10 reps;Right Quad  Sets: AROM;5 reps;Right Towel Squeeze: AROM;Both;10 reps Heel Slides: AAROM;Right;10 reps Long Arc Quad: AAROM;Right;10 reps    General Comments        Pertinent Vitals/Pain Pain Assessment: No/denies pain    Home Living                      Prior Function            PT Goals (current goals can now be found in the care plan section) Acute Rehab PT Goals Patient Stated Goal: Get back to walking PT Goal Formulation: With patient/family Time For Goal Achievement:  06/20/20 Potential to Achieve Goals: Fair Progress towards PT goals: Progressing toward goals    Frequency    7X/week      PT Plan Current plan remains appropriate    Co-evaluation              AM-PAC PT "6 Clicks" Mobility   Outcome Measure  Help needed turning from your back to your side while in a flat bed without using bedrails?: A Lot Help needed moving from lying on your back to sitting on the side of a flat bed without using bedrails?: A Lot Help needed moving to and from a bed to a chair (including a wheelchair)?: A Lot Help needed standing up from a chair using your arms (e.g., wheelchair or bedside chair)?: A Lot Help needed to walk in hospital room?: Total Help needed climbing 3-5 steps with a railing? : Total 6 Click Score: 10    End of Session Equipment Utilized During Treatment: Gait belt Activity Tolerance: Patient tolerated treatment well Patient left: in chair;with call bell/phone within reach;with chair alarm set;with family/visitor present Nurse Communication: Mobility status PT Visit Diagnosis: Muscle weakness (generalized) (M62.81);Hemiplegia and hemiparesis;Other abnormalities of gait and mobility (R26.89);Unsteadiness on feet (R26.81) Hemiplegia - Right/Left: Right Hemiplegia - dominant/non-dominant: Dominant Hemiplegia - caused by: Cerebral infarction     Time: 2919-1660 PT Time Calculation (min) (ACUTE ONLY): 24 min  Charges:  $Therapeutic Exercise: 8-22 mins $Therapeutic Activity: 8-22 mins                     Ethelreda Sukhu, PT, GCS 06/13/20,12:37 PM

## 2020-06-13 NOTE — Progress Notes (Signed)
Inpatient Rehab Admissions Coordinator:   I have no beds available for this patient to admit to CIR today.  Will continue to follow for timing of potential admission pending bed availability, hopefully tomorrow.   Shann Medal, PT, DPT Admissions Coordinator [phone] 06/13/20  11:01 AM

## 2020-06-13 NOTE — H&P (Addendum)
Physical Medicine and Rehabilitation Admission H&P    Chief Complaint  Patient presents with  . Weakness  . Aphasia  : HPI: Peter Ryan is a 69 year old right-handed male with history of obesity BMI 40.89, BPH, quit smoking 46 years ago, hypertension, diabetes mellitus, history of rectal bleeding, hyperlipidemia and non-Hodgkin's lymphoma status post chemotherapy currently in remission and under surveillance followed by Dr Randa Evens.  History taken from chart review and patient.  Patient lives with spouse.  1 level home 4 steps to entry.  Independent assistive device community ambulation.  He presented on 06/05/2020 with right hemiparesis and dysarthria.  Cranial CT unremarkable for acute intercranial process.  Patient did not receive TPA.  CT angiogram of head and neck with no large vessel occlusion.  MRI of the brain showed moderate sized left pontine infarct, no associated hemorrhage or mass-effect.  Stable to perhaps slightly faded acute white matter lacunar infarct adjacent to the right frontal lobe.  Admission chemistries potassium 3.4, glucose 252, SARS coronavirus negative, hemoglobin 11.9.  Echocardiogram with ejection fraction of 24%, grade 1 diastolic dysfunction.TEE with no acute findings and did not reveal any PFO.  Awaiting plan for loop recorder which will be discussed as outpatient with cardiology service Dr. Rockey Situ.  Currently maintained on aspirin for CVA prophylaxis.  No DAPT at this time due to history of rectal outlet bleeding.  Tolerating regular consistency diet.  Therapy evaluations completed and patient was admitted for a comprehensive rehab program.  Please see preadmission assessment from earlier today as well.  Review of Systems  Constitutional: Positive for malaise/fatigue. Negative for chills and fever.  HENT: Negative for hearing loss.   Eyes: Negative for double vision.  Respiratory: Negative for cough and shortness of breath.   Cardiovascular: Negative for  chest pain, palpitations and leg swelling.  Gastrointestinal: Positive for constipation. Negative for heartburn, nausea and vomiting.       GERD  Genitourinary: Positive for urgency. Negative for dysuria and flank pain.  Musculoskeletal: Positive for joint pain and myalgias.  Skin: Negative for rash.  Neurological: Positive for speech change, focal weakness and weakness.  All other systems reviewed and are negative.  Past Medical History:  Diagnosis Date  . Arthritis   . BPH (benign prostatic hyperplasia)   . Cancer ([place])    Non-Hodgkins lymphoma  . Chronic prostatitis   . Cirrhosis of liver ([place])   . Diabetes mellitus without complication ([place])   . Dyslipidemia   . Elevated PSA   . Gastric reflux   . Gout   . HTN (hypertension)   . Neuropathy   . Over weight   . Psoriasis   . Testicular pain, right   . Urinary frequency    Past Surgical History:  Procedure Laterality Date  . hydrocelectomy    . IR FLUORO GUIDE PORT INSERTION RIGHT  04/11/2017  . IR REMOVAL TUN ACCESS W/ PORT W/O FL MOD SED  05/29/2018  . PILONIDAL CYST EXCISION    . TEE WITHOUT CARDIOVERSION N/A 06/09/2020   Procedure: TRANSESOPHAGEAL ECHOCARDIOGRAM (TEE);  Surgeon: Minna Merritts, MD;  Location: [facility] ORS;  Service: Cardiovascular;  Laterality: N/A;   Family History  Problem Relation Age of Onset  . Uterine cancer Mother   . Diabetes Mother   . Cancer Mother   . Cancer Maternal Uncle   . Bladder Cancer Neg Hx   . Kidney disease Neg Hx   . Prostate cancer Neg Hx    Social History:  reports  that he quit smoking about 46 years ago. His smoking use included cigarettes. He has a 12.00 pack-year smoking history. He has never used smokeless tobacco. He reports that he does not drink alcohol and does not use drugs. Allergies:  Allergies  Allergen Reactions  . Metformin Other (See Comments)    Stomach upset    Medications Prior to Admission  Medication Sig Dispense Refill  . allopurinol (ZYLOPRIM)  300 MG tablet TAKE ONE TABLET EVERY DAY (Patient taking differently: Take 300 mg by mouth daily. ) 30 tablet 3  . amLODipine (NORVASC) 10 MG tablet TAKE ONE TABLET BY MOUTH EVERY DAY (Patient taking differently: Take 10 mg by mouth at bedtime. ) 30 tablet 5  . aspirin EC 81 MG tablet Take 81 mg by mouth daily.     . Cholecalciferol (VITAMIN D3) 1000 UNITS CAPS Take 1,000 Units by mouth daily.     . finasteride (PROSCAR) 5 MG tablet TAKE ONE TABLET BY MOUTH EVERY DAY (Patient taking differently: Take 5 mg by mouth daily. ) 90 tablet 3  . ketoconazole (NIZORAL) 2 % cream Apply 1 application topically daily.    Marland Kitchen ketoconazole (NIZORAL) 2 % shampoo Apply 1 application topically every other day. (wash face and scalp)    . losartan (COZAAR) 100 MG tablet TAKE ONE TABLET EVERY DAY BY MOUTH (Patient taking differently: Take 100 mg by mouth at bedtime. ) 90 tablet 1  . vitamin B-12 (CYANOCOBALAMIN) 1000 MCG tablet Take 1,000 mcg by mouth daily.      Drug Regimen Review Drug regimen was reviewed and remains appropriate with no significant issues identified  Home: Home Living Family/patient expects to be discharged to:: Inpatient rehab Living Arrangements: Spouse/significant other Available Help at Discharge: Family Type of Home: House Home Access: Stairs to enter [employer] of Steps: 4 Entrance Stairs-Rails: Right Home Layout: One level Home Equipment: [place] - 4 wheels, Cane - single point Additional Comments: reports he has not been driving since cataract surgery in July.  Lives With: Spouse   Functional History: Prior Function Level of Independence: Independent with assistive device(s) Comments: Pt had been able to do moderate community ambulation, has been seeing outpt PT for balance, strength, LBP  Functional Status:  Mobility: Bed Mobility Overal bed mobility: Needs Assistance Bed Mobility: Supine to Sit Rolling: Mod assist Sidelying to sit: Max assist, HOB  elevated Supine to sit: Mod assist, +2 for physical assistance Sit to supine: +2 for physical assistance, Max assist General bed mobility comments: Max A +2 required for pt to move from sitting EOB to supine Transfers Overall transfer level: Needs assistance Equipment used: None Transfers: Stand Pivot Transfers, Sit to/from Stand Sit to Stand: +2 physical assistance, Max assist Stand pivot transfers: Max assist, +2 physical assistance General transfer comment: Transfer from recliner to bed, pivoting toward L side. Left hand on bed rail, Max A +2 with knees blocked to facilitate stand and several stand attempts required to fully position bottom on EOB Ambulation/Gait General Gait Details: not attempted    ADL: ADL Overall ADL's : Needs assistance/impaired General ADL Comments: MAX A with LB ADLs in sitting, MIN/MOD A with UB ADLs in sitting. MOD +2 for SPS arm in arm t/f's  Cognition: Cognition Overall Cognitive Status: Within Functional Limits for tasks assessed Arousal/Alertness: Awake/alert Orientation Level: Oriented X4 Memory: Appears intact Immediate Memory Recall:  (wfl) Memory Recall Sock:  (NT) Memory Recall Blue:  (NT) Memory Recall Bed:  (NT) Problem Solving:  (NT) Executive Function: Sequencing,  Reasoning, Organizing (wfl) Reasoning: Appears intact Sequencing: Appears intact Organizing: Appears intact Cognition Arousal/Alertness: Awake/alert Behavior During Therapy: WFL for tasks assessed/performed Overall Cognitive Status: Within Functional Limits for tasks assessed General Comments: wife states he is still a bit sleepy/groggy after having had procedure (TEE) earlier today  Physical Exam: Blood pressure 137/67, pulse 66, temperature 98.1 F (36.7 C), temperature source Oral, resp. rate 16, height 5\' 10"  (1.778 m), weight 129.3 kg, SpO2 97 %. Physical Exam Vitals reviewed.  Constitutional:      General: He is not in acute distress.    Appearance: He is  obese. He is not ill-appearing.  HENT:     Head: Normocephalic and atraumatic.     Right Ear: External ear normal.     Left Ear: External ear normal.     Nose: Nose normal.  Eyes:     General:        Right eye: No discharge.        Left eye: No discharge.     Extraocular Movements: Extraocular movements intact.  Cardiovascular:     Rate and Rhythm: Normal rate and regular rhythm.  Pulmonary:     Effort: Pulmonary effort is normal. No respiratory distress.     Breath sounds: No stridor.  Abdominal:     General: Abdomen is flat. Bowel sounds are normal. There is no distension.  Musculoskeletal:     Cervical back: Normal range of motion and neck supple.     Comments: No edema or tenderness in extremities  Skin:    General: Skin is warm and dry.  Neurological:     Mental Status: He is alert.     Comments: Makes eye contact with examiner.   Dysarthria, but intelligible.   Alert and oriented x3 Motor: LUE/LLE: 5/5 proximal distal RUE: Shoulder abduction 2/5, distally 3/5 Right lower extremity: 2/5 proximal distal Sensation intact light touch Left facial weakness  Psychiatric:        Mood and Affect: Mood normal.        Behavior: Behavior normal.     Results for orders placed or performed during the hospital encounter of 06/05/20 (from the past 48 hour(s))  Glucose, capillary     Status: Abnormal   Collection Time: 06/12/20 11:35 AM  Result Value Ref Range   Glucose-Capillary 168 (H) 70 - 99 mg/dL    Comment: Glucose reference range applies only to samples taken after fasting for at least 8 hours.   Comment 1 Notify RN    Comment 2 Document in Chart   Glucose, capillary     Status: Abnormal   Collection Time: 06/12/20  5:00 PM  Result Value Ref Range   Glucose-Capillary 163 (H) 70 - 99 mg/dL    Comment: Glucose reference range applies only to samples taken after fasting for at least 8 hours.   Comment 1 Notify RN    Comment 2 Document in Chart   Glucose, capillary      Status: Abnormal   Collection Time: 06/12/20  7:43 PM  Result Value Ref Range   Glucose-Capillary 179 (H) 70 - 99 mg/dL    Comment: Glucose reference range applies only to samples taken after fasting for at least 8 hours.  Glucose, capillary     Status: Abnormal   Collection Time: 06/12/20 11:41 PM  Result Value Ref Range   Glucose-Capillary 178 (H) 70 - 99 mg/dL    Comment: Glucose reference range applies only to samples taken after fasting for at least  8 hours.  Glucose, capillary     Status: Abnormal   Collection Time: 06/13/20  3:32 AM  Result Value Ref Range   Glucose-Capillary 183 (H) 70 - 99 mg/dL    Comment: Glucose reference range applies only to samples taken after fasting for at least 8 hours.  Glucose, capillary     Status: Abnormal   Collection Time: 06/13/20  7:35 AM  Result Value Ref Range   Glucose-Capillary 189 (H) 70 - 99 mg/dL    Comment: Glucose reference range applies only to samples taken after fasting for at least 8 hours.   Comment 1 Notify RN   Glucose, capillary     Status: Abnormal   Collection Time: 06/13/20 11:45 AM  Result Value Ref Range   Glucose-Capillary 197 (H) 70 - 99 mg/dL    Comment: Glucose reference range applies only to samples taken after fasting for at least 8 hours.   Comment 1 Notify RN   Glucose, capillary     Status: Abnormal   Collection Time: 06/13/20  4:41 PM  Result Value Ref Range   Glucose-Capillary 217 (H) 70 - 99 mg/dL    Comment: Glucose reference range applies only to samples taken after fasting for at least 8 hours.   Comment 1 Notify RN   Glucose, capillary     Status: Abnormal   Collection Time: 06/13/20  8:01 PM  Result Value Ref Range   Glucose-Capillary 219 (H) 70 - 99 mg/dL    Comment: Glucose reference range applies only to samples taken after fasting for at least 8 hours.  Glucose, capillary     Status: Abnormal   Collection Time: 06/13/20 11:55 PM  Result Value Ref Range   Glucose-Capillary 180 (H) 70 - 99  mg/dL    Comment: Glucose reference range applies only to samples taken after fasting for at least 8 hours.  Glucose, capillary     Status: Abnormal   Collection Time: 06/14/20  3:09 AM  Result Value Ref Range   Glucose-Capillary 161 (H) 70 - 99 mg/dL    Comment: Glucose reference range applies only to samples taken after fasting for at least 8 hours.  Glucose, capillary     Status: Abnormal   Collection Time: 06/14/20  7:39 AM  Result Value Ref Range   Glucose-Capillary 170 (H) 70 - 99 mg/dL    Comment: Glucose reference range applies only to samples taken after fasting for at least 8 hours.   Comment 1 Notify RN    No results found.     Medical Problem List and Plan: 1.  Right hemiparesis secondary to left pontine infarction.  Follow-up outpatient cardiology services to discuss loop recorder  -patient may shower  -ELOS/Goals: 14-17 days/supervision/min a  Admit to CIR 2.  Antithrombotics: -DVT/anticoagulation: SCDs  -antiplatelet therapy: Aspirin 325 mg daily 3. Pain Management: Tylenol as needed 4. Mood: Provide emotional support  -antipsychotic agents: N/A 5. Neuropsych: This patient is capable of making decisions on his own behalf. 6. Skin/Wound Care: Routine skin checks 7. Fluids/Electrolytes/Nutrition: Routine in and outs.  CMP ordered for tomorrow. 8.  Hypertension.  Norvasc 10 mg daily, Cozaar 100 mg daily.    Monitor with increased mobility. 9.  Diabetes mellitus with hyperglycemia.  Hemoglobin A1c 7.5.  SSI.  Monitor with increased mobility. 10.  BPH.  Proscar.    Check PVRs 11.  Hyperlipidemia.  Lipitor 12.  History of gout.  Zyloprim 300 mg daily.  Monitor for any gout flareups 13.  History of non-Hodgkin's lymphoma status post chemotherapy.  Follow-up Dr Randa Evens as outpatient. 14.  Morbid obesity.  BMI 40.89.  Dietary follow-up 15.  History of rectal outlet bleeding.  Felt to be related to possible hemorrhoids.  Hemoglobin hematocrit mains stable.  He was  to follow-up outpatient gastroenterology services.  Patient had deferred GI work-up while in the hospital.  CBC ordered for tomorrow.  Lavon Paganini Angiulli, PA-C 06/14/2020  I have personally performed a face to face diagnostic evaluation, including, but not limited to relevant history and physical exam findings, of this patient and developed relevant assessment and plan.  Additionally, I have reviewed and concur with the physician assistant's documentation above.  Delice Lesch, MD, ABPMR

## 2020-06-13 NOTE — Progress Notes (Addendum)
Occupational Therapy Treatment Patient Details Name: Peter Ryan MRN: [id number] DOB: 02-Jul-1951 Today's Date: 06/13/2020    History of present illness Stancil Deisher is a 69 y.o. male with medical history significant for obesity, BPH, hypertension, diabetes mellitus, dyslipidemia and non-Hodgkin's lymphoma status post chemotherapy currently under surveillance who presented to the emergency room for evaluation of right-sided weakness that was progressing  prior to arrival.  Prior to admit he had a fall and needed EMS to assist patient back to bed.  He has not been able to ambulate since his fall and by all accounts signs and symptoms have progressively gotten worse since. His wife also states that his speech was slurred. Imaging reveals R pontine and L frontal infarcts.    OT comments  Pt seen for OT treatment on this date. Upon arrival to room, pt alert, seated in recliner. States that he has enjoyed being up out of bed, but is now eager to move back, as his "tailbone is getting sore." Repositioned recliner so that pt could transfer to his strong left side, lowered left armrest on recliner so that it could sit flush with bed. Pt able to lift his R UE slightly to allow for gait belt placement -- a movement he could not make yesterday. Pt required MaxA and +2 to stand, with R knee blocked. Required several attempts to position bottom fully on EOB. Displayed left lateral lean while seated. MaxA +2 to transition from EOB to supine, with HOB elevated. Pt able to demonstrate slight finger extension in R hand -- another movement he was unable to perform yesterday. Pt left in bed with R UE supported and within view, call bell activated. Pt making good progress toward goals and continues to benefit from skilled OT services to maximize recovery. Will continue to follow POC. Discharge recommendation remains appropriate.    Follow Up Recommendations  CIR    Equipment Recommendations       Recommendations  for Other Services      Precautions / Restrictions Precautions Precautions: Fall Restrictions Weight Bearing Restrictions: No Other Position/Activity Restrictions: protect weak RUE       Mobility Bed Mobility Overal bed mobility: Needs Assistance Bed Mobility: Sit to Supine     Sit to supine: +2 for physical assistance;Max assist   General bed mobility comments: Max A +2 required for pt to move from sitting EOB to supine  Transfers Overall transfer level: Needs assistance Equipment used: None Transfers: Stand Pivot Transfers;Sit to/from Stand Sit to Stand: +2 physical assistance;Max assist Stand pivot transfers: Max assist;+2 physical assistance       General transfer comment: Transfer from recliner to bed, pivoting toward L side. Left hand on bed rail, Max A +2 with knees blocked to facilitate stand and several stand attempts required to fully position bottom on EOB    Balance Overall balance assessment: Needs assistance Sitting-balance support: Feet supported;Single extremity supported Sitting balance-Leahy Scale: Fair Sitting balance - Comments: sitting unsupported in chair, pt has several episodes of left lateral leaning, requiring MinA for righting Postural control: Posterior lean;Left lateral lean Standing balance support: Single extremity supported;During functional activity Standing balance-Leahy Scale: Poor Standing balance comment: R knee blocking for standing                           ADL either performed or assessed with clinical judgement   ADL Overall ADL's : Needs assistance/impaired  Vision       Perception     Praxis      Cognition Arousal/Alertness: Awake/alert Behavior During Therapy: WFL for tasks assessed/performed Overall Cognitive Status: Within Functional Limits for tasks assessed                                          Exercises  Other  Exercises: Pt transfered recliner to bed; educated re: positioning of RUE, exercises pt can perform in bed   Shoulder Instructions       General Comments      Pertinent Vitals/ Pain       Pain Assessment: No/denies pain  Home Living                                          Prior Functioning/Environment              Frequency  Min 2X/week        Progress Toward Goals  OT Goals(current goals can now be found in the care plan section)  Progress towards OT goals: Progressing toward goals  Acute Rehab OT Goals Patient Stated Goal: Get back to walking OT Goal Formulation: With patient Time For Goal Achievement: 06/20/20 Potential to Achieve Goals: Good  Plan Discharge plan remains appropriate    Co-evaluation                 AM-PAC OT "6 Clicks" Daily Activity     Outcome Measure   Help from another person eating meals?: A Little Help from another person taking care of personal grooming?: A Little Help from another person toileting, which includes using toliet, bedpan, or urinal?: A Lot Help from another person bathing (including washing, rinsing, drying)?: A Lot Help from another person to put on and taking off regular upper body clothing?: A Little Help from another person to put on and taking off regular lower body clothing?: A Lot 6 Click Score: 15    End of Session Equipment Utilized During Treatment: Gait belt  OT Visit Diagnosis: Unsteadiness on feet (R26.81);Muscle weakness (generalized) (M62.81);Other symptoms and signs involving the nervous system (R29.898);Hemiplegia and hemiparesis Hemiplegia - Right/Left: Right Hemiplegia - dominant/non-dominant: Dominant   Activity Tolerance Patient tolerated treatment well   Patient Left in bed;with call bell/phone within reach;with bed alarm set   Nurse Communication          Time: 3329-5188 OT Time Calculation (min): 26 min  Charges: OT General Charges $OT Visit: 1 Visit OT  Treatments $Self Care/Home Management : 8-22 mins $Therapeutic Activity: 8-22 mins

## 2020-06-13 NOTE — Plan of Care (Signed)
  Problem: Self-Care: Goal: Ability to participate in self-care as condition permits will improve Outcome: Progressing  Patient was able to pull himself up in bed with verbal assistance from this nurse. Will continue to encourage patient to attempt self care when possible.

## 2020-06-13 NOTE — Progress Notes (Signed)
PROGRESS NOTE    Peter Ryan  EKC:[id number] DOB: July 03, 1951 DOA: 06/05/2020 PCP: [name]., MD  Brief Narrative:  Peter Ryan is a 69 y.o. male with medical history significant forobesity, BPH, hypertension, diabetes mellitus, dyslipidemia and non-Hodgkin's lymphoma status post chemotherapy currently under surveillance.  He presented to the hospital because of right-sided weakness that started on the day prior to admission.  He still felt weak in his right leg the following day and he fell on the floor when he tried to get up to use the bathroom.  His wife also noticed slurred speech.  MRI brain showed acute left pontine and right frontal white matter infarcts.  He was on low-dose aspirin at home and neurologist has recommended aspirin be increased to 325 mg daily.  9/8: Patient seen and examined.  Wife at bedside.  Complains of weakness.  9/9: Patient seen and examined.  Wife remains at bedside.  Lengthy conversation about slow GI bleed.  Hemoglobin has been stable.  At this time we elect to defer GI evaluation outpatient.  Patient and wife were given clear instructions regarding need for urgent medical attention should bleed acutely worsen.  Approximately 1230 received a call from bedside RN with concerns of patient had worsening neurologic exam and deficits.  Worsening right-sided facial droop.  Decreased grip strength on right.  Discussed with on-call neurologist who recommended stat MRI brain.  Ordered.  Currently pending  9/10: Patient seen and examined.  Patient's case discussed with Dr. Rockey Situ from cardiology.  TEE today.  No acute findings.  Cardiology recommended EP evaluation which can be deferred outpatient for consideration of a reveal device essentially an implantable loop recorder.  Discussed plan of care with patient and wife at bedside today.  Plan of care also discussed with neurology consultant.  9/11: Patient seen and examined.  TEE complete yesterday.   No acute findings.  Outpatient EP follow-up for reveal device consideration.  Plan remains to get patient to CIR.  Unfortunately is what happened over the weekend.  9/12: Patient seen and examined.  No acute events overnight.  Wife at bedside.  Neurologic exam slightly improved.  9/13: Patient seen and examined.  No acute events overnight.  Wife at bedside.  Neurologic exam slightly improved.  Grip strength on the right improving.  No beds available at CIR today  9/14: Patient seen and examined.  Awaiting bed at CIR.  Anticipate tomorrow.  Wife at bedside.  No complaints.   Assessment & Plan:   Principal Problem:   Acute CVA (cerebrovascular accident) [facility]) Active Problems:   Type 2 diabetes mellitus (HCC)   Benign essential hypertension   Obesity, Class III, BMI 40-49.9 (morbid obesity) ([place])  Acute CVA, left pontine acute infarction 2 days prior to admission Not a TPA candidate given last known time well MRI confirmed infarction CTA head and neck: No LVO EKG normal sinus rhythm Transthoracic echocardiogram reassuring 9/9: Worsening neurologic symptoms noted.  Repeat stat MRI brain ordered -Repeat MRI brain demonstrates increased diffusion-weighted signal without hemorrhage or mass-effect -TEE 9/10 did not reveal any PFO, ASD Plan: BP management, home medications restarted Continue aspirin 325 mg p.o. daily Continue Lipitor PT and OT as tolerated Frequent neuro checks SLP evaluation -Cardiology has arrange for outpatient evaluation by EP for consideration of implantable loop recorder, reveal device  Patient could be a good candidate for dual antiplatelet therapy can considering severe intracranial atherosclerosis.  Will defer this addition for now.  Patient is currently undergoing outpatient work-up  for suspected GI bleed.  I recommend that the patient go back and see the gastroenterologist to achieve resolution on GI bleed.  Once this is achieved outpatient neurology can  evaluate for need regarding dual antiplatelet therapy  Lower GI bleed Patient has had intermittent BRBPR Hemoglobin has been stable Rectal bleeding has been intermittent and small-volume Could be hemorrhoidal versus possibly diverticular  Lengthy conversation with the patient and wife at bedside.  Explained that endoscopic evaluation likely involve a colonoscopy and associated bowel prep.  I offered to reach out to the gastroenterologist here.  At this time patient and wife are electing to defer this procedure to outpatient.  I feel this is acceptable choice.  Patient's hemoglobin has been relatively stable and given his recent infarct he is significantly debilitated.  Clear return to ED instructions have been provided should patient's bleed acutely worsen.   Hypertension Resume amlodipine, home dose 10 mg Resume losartan 100 mg nightly  Type 2 diabetes mellitus On sliding scale insulin before meals and at bedtime Can resume home regimen at discharge  Morbid obesity Dietitian consult  Gout Continue allopurinol  DVT prophylaxis: SCD Code Status: Full Family Communication: Wife at bedside Disposition Plan: Status is: Inpatient  Remains inpatient appropriate because:Unsafe d/c plan   Dispo: The patient is from: Home              Anticipated d/c is to: CIR              Anticipated d/c date is: 1 day              Patient currently is medically stable to d/c.   Patient currently medically stable for discharge.  Pending bed availability at CIR. for rehab and motor plan for discharge on 1521.         Consultants:   Neurology, cardiology  Procedures:   None  Antimicrobials:   None   Subjective: Patient seen and examined.  Decreased grip strength on right.  Inability to raise arm.  Objective: Vitals:   06/12/20 0831 06/12/20 1520 06/12/20 2054 06/13/20 0733  BP: (!) 152/72 (!) 154/75 (!) 154/73 (!) 157/77  Pulse: 71 81 77 74  Resp: 18 16 18 16   Temp: 97.7 F  (36.5 C) 98.6 F (37 C) 98.7 F (37.1 C) 98.3 F (36.8 C)  TempSrc: Axillary Oral Oral Oral  SpO2: 95% 93% 97% 96%  Weight:      Height:        Intake/Output Summary (Last 24 hours) at 06/13/2020 1321 Last data filed at 06/13/2020 1006 Gross per 24 hour  Intake 240 ml  Output 500 ml  Net -260 ml   Filed Weights   06/05/20 1051 06/09/20 0933  Weight: 129.1 kg 129.3 kg    Examination:  General: No apparent distress, patient appears well HEENT: Normocephalic, atraumatic Neck, supple, trachea midline, no tenderness Heart: Regular rate and rhythm, S1/S2 normal, no murmurs Lungs: Clear to auscultation bilaterally, no adventitious sounds, normal work of breathing Abdomen: Soft, nontender, nondistended, positive bowel sounds Extremities: Normal, atraumatic, no clubbing or cyanosis, normal muscle tone Skin: No rashes or lesions, normal color Neurologic: Right upper and lower extremity weakness.  Decreased grip strength on right.  Right-sided facial droop.   psychiatric: Normal affect        Data Reviewed: I have personally reviewed following labs and imaging studies  CBC: Recent Labs  Lab 06/07/20 1716 06/08/20 0507 06/09/20 0413 06/10/20 0407  WBC  --  7.2 8.8 8.6  NEUTROABS  --  4.4 5.2 5.5  HGB 12.1* 11.7* 12.0* 11.8*  HCT  --  32.8* 35.2* 33.5*  MCV  --  83.9 87.8 85.0  PLT  --  218 246 935   Basic Metabolic Panel: No results for input(s): NA, K, CL, CO2, GLUCOSE, BUN, CREATININE, CALCIUM, MG, PHOS in the last 168 hours. GFR: Estimated Creatinine Clearance: 119.5 mL/min (by C-G formula based on SCr of 0.81 mg/dL). Liver Function Tests: No results for input(s): AST, ALT, ALKPHOS, BILITOT, PROT, ALBUMIN in the last 168 hours. No results for input(s): LIPASE, AMYLASE in the last 168 hours. No results for input(s): AMMONIA in the last 168 hours. Coagulation Profile: No results for input(s): INR, PROTIME in the last 168 hours. Cardiac Enzymes: No results for  input(s): CKTOTAL, CKMB, CKMBINDEX, TROPONINI in the last 168 hours. BNP (last 3 results) No results for input(s): PROBNP in the last 8760 hours. HbA1C: No results for input(s): HGBA1C in the last 72 hours. CBG: Recent Labs  Lab 06/12/20 1943 06/12/20 2341 06/13/20 0332 06/13/20 0735 06/13/20 1145  GLUCAP 179* 178* 183* 189* 197*   Lipid Profile: No results for input(s): CHOL, HDL, LDLCALC, TRIG, CHOLHDL, LDLDIRECT in the last 72 hours. Thyroid Function Tests: No results for input(s): TSH, T4TOTAL, FREET4, T3FREE, THYROIDAB in the last 72 hours. Anemia Panel: No results for input(s): VITAMINB12, FOLATE, FERRITIN, TIBC, IRON, RETICCTPCT in the last 72 hours. Sepsis Labs: No results for input(s): PROCALCITON, LATICACIDVEN in the last 168 hours.  Recent Results (from the past 240 hour(s))  SARS Coronavirus 2 by RT PCR (hospital order, performed in [facility] hospital lab) Nasopharyngeal Nasopharyngeal Swab     Status: None   Collection Time: 06/05/20  2:39 PM   Specimen: Nasopharyngeal Swab  Result Value Ref Range Status   SARS Coronavirus 2 NEGATIVE NEGATIVE Final    Comment: (NOTE) SARS-CoV-2 target nucleic acids are NOT DETECTED.  The SARS-CoV-2 RNA is generally detectable in upper and lower respiratory specimens during the acute phase of infection. The lowest concentration of SARS-CoV-2 viral copies this assay can detect is 250 copies / mL. A negative result does not preclude SARS-CoV-2 infection and should not be used as the sole basis for treatment or other patient management decisions.  A negative result may occur with improper specimen collection / handling, submission of specimen other than nasopharyngeal swab, presence of viral mutation(s) within the areas targeted by this assay, and inadequate number of viral copies (<250 copies / mL). A negative result must be combined with clinical observations, patient history, and epidemiological information.  Fact Sheet for  Patients:   StrictlyIdeas.no  Fact Sheet for Healthcare Providers: BankingDealers.co.za  This test is not yet approved or  cleared by the [place] FDA and has been authorized for detection and/or diagnosis of SARS-CoV-2 by FDA under an Emergency Use Authorization (EUA).  This EUA will remain in effect (meaning this test can be used) for the duration of the COVID-19 declaration under Section 564(b)(1) of the Act, 21 U.S.C. section 360bbb-3(b)(1), unless the authorization is terminated or revoked sooner.  Performed at [facility], [address], [place], [place] [address]          Radiology Studies: No results found.      Scheduled Meds: .  stroke: mapping our early stages of recovery book   Does not apply Once  . allopurinol  300 mg Oral Daily  . amLODipine  10 mg Oral Daily  . aspirin  300 mg  Rectal Daily   Or  . aspirin EC  325 mg Oral Daily  . atorvastatin  80 mg Oral Daily  . cholecalciferol  1,000 Units Oral Daily  . finasteride  5 mg Oral Daily  . hydrocortisone cream   Topical Q M,W,F  . insulin aspart  0-20 Units Subcutaneous TID AC & HS  . losartan  100 mg Oral Daily  . polyethylene glycol  17 g Oral Daily  . Ensure Max Protein  11 oz Oral BID BM  . senna-docusate  1 tablet Oral BID  . sodium chloride flush  3 mL Intravenous Once   Continuous Infusions:   LOS: 8 days    Time spent: 15 minutes    Sidney Ace, MD [facility] Pager [phone]  If 7PM-7AM, please contact night-coverage 06/13/2020, 1:21 PM

## 2020-06-14 ENCOUNTER — Other Ambulatory Visit: Payer: Self-pay

## 2020-06-14 ENCOUNTER — Encounter (HOSPITAL_COMMUNITY): Payer: Self-pay | Admitting: Physical Medicine & Rehabilitation

## 2020-06-14 ENCOUNTER — Inpatient Hospital Stay (HOSPITAL_COMMUNITY)
Admission: RE | Admit: 2020-06-14 | Discharge: 2020-07-11 | DRG: 057 | Disposition: A | Discharge status: Home Health | Payer: Medicare Other | Source: Other Acute Inpatient Hospital | Attending: Physical Medicine & Rehabilitation | Admitting: Physical Medicine & Rehabilitation

## 2020-06-14 ENCOUNTER — Ambulatory Visit: Payer: Medicare Other

## 2020-06-14 DIAGNOSIS — I10 Essential (primary) hypertension: Secondary | ICD-10-CM | POA: Diagnosis present

## 2020-06-14 DIAGNOSIS — E785 Hyperlipidemia, unspecified: Secondary | ICD-10-CM | POA: Diagnosis present

## 2020-06-14 DIAGNOSIS — Z7982 Long term (current) use of aspirin: Secondary | ICD-10-CM | POA: Diagnosis not present

## 2020-06-14 DIAGNOSIS — G8191 Hemiplegia, unspecified affecting right dominant side: Secondary | ICD-10-CM

## 2020-06-14 DIAGNOSIS — E1142 Type 2 diabetes mellitus with diabetic polyneuropathy: Secondary | ICD-10-CM | POA: Diagnosis present

## 2020-06-14 DIAGNOSIS — C8333 Diffuse large B-cell lymphoma, intra-abdominal lymph nodes: Secondary | ICD-10-CM

## 2020-06-14 DIAGNOSIS — E1165 Type 2 diabetes mellitus with hyperglycemia: Secondary | ICD-10-CM | POA: Diagnosis present

## 2020-06-14 DIAGNOSIS — K5900 Constipation, unspecified: Secondary | ICD-10-CM | POA: Diagnosis present

## 2020-06-14 DIAGNOSIS — M109 Gout, unspecified: Secondary | ICD-10-CM | POA: Diagnosis present

## 2020-06-14 DIAGNOSIS — I6350 Cerebral infarction due to unspecified occlusion or stenosis of unspecified cerebral artery: Secondary | ICD-10-CM | POA: Diagnosis not present

## 2020-06-14 DIAGNOSIS — R29810 Facial weakness: Secondary | ICD-10-CM

## 2020-06-14 DIAGNOSIS — E876 Hypokalemia: Secondary | ICD-10-CM | POA: Diagnosis not present

## 2020-06-14 DIAGNOSIS — Z87891 Personal history of nicotine dependence: Secondary | ICD-10-CM

## 2020-06-14 DIAGNOSIS — Z9221 Personal history of antineoplastic chemotherapy: Secondary | ICD-10-CM

## 2020-06-14 DIAGNOSIS — Z6841 Body Mass Index (BMI) 40.0 and over, adult: Secondary | ICD-10-CM

## 2020-06-14 DIAGNOSIS — N400 Enlarged prostate without lower urinary tract symptoms: Secondary | ICD-10-CM | POA: Diagnosis present

## 2020-06-14 DIAGNOSIS — I639 Cerebral infarction, unspecified: Secondary | ICD-10-CM | POA: Diagnosis present

## 2020-06-14 DIAGNOSIS — I69351 Hemiplegia and hemiparesis following cerebral infarction affecting right dominant side: Secondary | ICD-10-CM | POA: Diagnosis not present

## 2020-06-14 DIAGNOSIS — K7460 Unspecified cirrhosis of liver: Secondary | ICD-10-CM | POA: Diagnosis present

## 2020-06-14 DIAGNOSIS — Z8572 Personal history of non-Hodgkin lymphomas: Secondary | ICD-10-CM | POA: Diagnosis not present

## 2020-06-14 DIAGNOSIS — Z79899 Other long term (current) drug therapy: Secondary | ICD-10-CM

## 2020-06-14 DIAGNOSIS — I69320 Aphasia following cerebral infarction: Secondary | ICD-10-CM | POA: Diagnosis not present

## 2020-06-14 DIAGNOSIS — K625 Hemorrhage of anus and rectum: Secondary | ICD-10-CM

## 2020-06-14 LAB — GLUCOSE, CAPILLARY
Glucose-Capillary: 140 mg/dL — ABNORMAL HIGH (ref 70–99)
Glucose-Capillary: 161 mg/dL — ABNORMAL HIGH (ref 70–99)
Glucose-Capillary: 170 mg/dL — ABNORMAL HIGH (ref 70–99)
Glucose-Capillary: 179 mg/dL — ABNORMAL HIGH (ref 70–99)
Glucose-Capillary: 180 mg/dL — ABNORMAL HIGH (ref 70–99)
Glucose-Capillary: 190 mg/dL — ABNORMAL HIGH (ref 70–99)

## 2020-06-14 MED ORDER — AMLODIPINE BESYLATE 10 MG PO TABS
10.0000 mg | ORAL_TABLET | Freq: Every day | ORAL | Status: DC
  Administered 2020-06-15 – 2020-06-19 (×5): 10 mg via ORAL
  Filled 2020-06-14 (×5): qty 1

## 2020-06-14 MED ORDER — ACETAMINOPHEN 160 MG/5ML PO SOLN: 650.0000 mg | ORAL | Status: DC | PRN

## 2020-06-14 MED ORDER — ACETAMINOPHEN 650 MG RE SUPP: 650.0000 mg | RECTAL | Status: DC | PRN

## 2020-06-14 MED ORDER — INSULIN ASPART 100 UNIT/ML ~~LOC~~ SOLN
0.0000 [IU] | Freq: Three times a day (TID) | SUBCUTANEOUS | Status: DC
  Administered 2020-06-14 – 2020-06-15 (×3): 3 [IU] via SUBCUTANEOUS
  Administered 2020-06-15 – 2020-06-16 (×6): 4 [IU] via SUBCUTANEOUS
  Administered 2020-06-16: 7 [IU] via SUBCUTANEOUS
  Administered 2020-06-17: 4 [IU] via SUBCUTANEOUS
  Administered 2020-06-17: 7 [IU] via SUBCUTANEOUS
  Administered 2020-06-17: 4 [IU] via SUBCUTANEOUS
  Administered 2020-06-17 – 2020-06-18 (×2): 7 [IU] via SUBCUTANEOUS
  Administered 2020-06-18 – 2020-06-19 (×7): 4 [IU] via SUBCUTANEOUS
  Administered 2020-06-20: 3 [IU] via SUBCUTANEOUS
  Administered 2020-06-20 (×3): 4 [IU] via SUBCUTANEOUS
  Administered 2020-06-21: 3 [IU] via SUBCUTANEOUS
  Administered 2020-06-21 – 2020-06-22 (×5): 4 [IU] via SUBCUTANEOUS
  Administered 2020-06-22: 3 [IU] via SUBCUTANEOUS
  Administered 2020-06-22 – 2020-06-23 (×2): 4 [IU] via SUBCUTANEOUS
  Administered 2020-06-23: 3 [IU] via SUBCUTANEOUS
  Administered 2020-06-23: 4 [IU] via SUBCUTANEOUS
  Administered 2020-06-23: 7 [IU] via SUBCUTANEOUS
  Administered 2020-06-24 (×2): 4 [IU] via SUBCUTANEOUS
  Administered 2020-06-24 (×2): 3 [IU] via SUBCUTANEOUS
  Administered 2020-06-25 (×3): 4 [IU] via SUBCUTANEOUS
  Administered 2020-06-25: 3 [IU] via SUBCUTANEOUS
  Administered 2020-06-26 (×3): 4 [IU] via SUBCUTANEOUS
  Administered 2020-06-26: 3 [IU] via SUBCUTANEOUS
  Administered 2020-06-27 (×3): 4 [IU] via SUBCUTANEOUS
  Administered 2020-06-27 – 2020-06-28 (×2): 3 [IU] via SUBCUTANEOUS
  Administered 2020-06-28 (×2): 4 [IU] via SUBCUTANEOUS
  Administered 2020-06-29 – 2020-07-01 (×7): 3 [IU] via SUBCUTANEOUS
  Administered 2020-07-01 – 2020-07-02 (×2): 4 [IU] via SUBCUTANEOUS
  Administered 2020-07-03 – 2020-07-04 (×4): 3 [IU] via SUBCUTANEOUS
  Administered 2020-07-05 (×2): 4 [IU] via SUBCUTANEOUS
  Administered 2020-07-05 – 2020-07-06 (×2): 3 [IU] via SUBCUTANEOUS
  Administered 2020-07-06: 4 [IU] via SUBCUTANEOUS
  Administered 2020-07-06 – 2020-07-08 (×4): 3 [IU] via SUBCUTANEOUS
  Administered 2020-07-08: 4 [IU] via SUBCUTANEOUS
  Administered 2020-07-08 – 2020-07-09 (×2): 3 [IU] via SUBCUTANEOUS
  Administered 2020-07-09: 4 [IU] via SUBCUTANEOUS
  Administered 2020-07-09 – 2020-07-11 (×5): 3 [IU] via SUBCUTANEOUS

## 2020-06-14 MED ORDER — ENSURE MAX PROTEIN PO LIQD
11.0000 [oz_av] | Freq: Two times a day (BID) | ORAL | Status: DC
  Administered 2020-06-17 – 2020-07-10 (×37): 11 [oz_av] via ORAL
  Filled 2020-06-14 (×3): qty 330
  Filled 2020-06-14: qty 660
  Filled 2020-06-14 (×10): qty 330
  Filled 2020-06-14: qty 660
  Filled 2020-06-14 (×6): qty 330

## 2020-06-14 MED ORDER — ASPIRIN 300 MG RE SUPP
300.0000 mg | Freq: Every day | RECTAL | Status: DC
  Filled 2020-06-14 (×12): qty 1

## 2020-06-14 MED ORDER — FINASTERIDE 5 MG PO TABS
5.0000 mg | ORAL_TABLET | Freq: Every day | ORAL | Status: DC
  Administered 2020-06-15 – 2020-07-11 (×27): 5 mg via ORAL
  Filled 2020-06-14 (×29): qty 1

## 2020-06-14 MED ORDER — ATORVASTATIN CALCIUM 80 MG PO TABS: 80.0000 mg | ORAL_TABLET | Freq: Every day | ORAL | Status: DC

## 2020-06-14 MED ORDER — VITAMIN B-12 1000 MCG PO TABS
1000.0000 ug | ORAL_TABLET | Freq: Every day | ORAL | Status: DC
  Administered 2020-06-14 – 2020-07-11 (×28): 1000 ug via ORAL
  Filled 2020-06-14 (×28): qty 1

## 2020-06-14 MED ORDER — ASPIRIN 325 MG PO TBEC
325.0000 mg | DELAYED_RELEASE_TABLET | Freq: Every day | ORAL | 0 refills | Status: DC
Start: 2020-06-15 — End: 2022-12-22

## 2020-06-14 MED ORDER — HYDROCORTISONE 1 % EX CREA
TOPICAL_CREAM | CUTANEOUS | Status: DC
  Filled 2020-06-14: qty 28

## 2020-06-14 MED ORDER — POLYETHYLENE GLYCOL 3350 17 G PO PACK
17.0000 g | PACK | Freq: Every day | ORAL | Status: DC
  Administered 2020-06-17 – 2020-07-09 (×6): 17 g via ORAL
  Filled 2020-06-14 (×22): qty 1

## 2020-06-14 MED ORDER — ATORVASTATIN CALCIUM 80 MG PO TABS
80.0000 mg | ORAL_TABLET | Freq: Every day | ORAL | Status: DC
  Administered 2020-06-20 – 2020-07-11 (×22): 80 mg via ORAL
  Filled 2020-06-14 (×27): qty 1

## 2020-06-14 MED ORDER — ALLOPURINOL 300 MG PO TABS
300.0000 mg | ORAL_TABLET | Freq: Every day | ORAL | Status: DC
  Administered 2020-06-15 – 2020-07-11 (×27): 300 mg via ORAL
  Filled 2020-06-14 (×27): qty 1

## 2020-06-14 MED ORDER — ASPIRIN EC 325 MG PO TBEC
325.0000 mg | DELAYED_RELEASE_TABLET | Freq: Every day | ORAL | Status: DC
  Administered 2020-06-15 – 2020-07-11 (×26): 325 mg via ORAL
  Filled 2020-06-14 (×27): qty 1

## 2020-06-14 MED ORDER — SENNOSIDES-DOCUSATE SODIUM 8.6-50 MG PO TABS
1.0000 | ORAL_TABLET | Freq: Two times a day (BID) | ORAL | Status: DC
  Administered 2020-06-14 – 2020-07-10 (×23): 1 via ORAL
  Filled 2020-06-14 (×48): qty 1

## 2020-06-14 MED ORDER — LOSARTAN POTASSIUM 50 MG PO TABS
100.0000 mg | ORAL_TABLET | Freq: Every day | ORAL | Status: DC
  Administered 2020-06-15 – 2020-06-19 (×5): 100 mg via ORAL
  Filled 2020-06-14 (×5): qty 2

## 2020-06-14 MED ORDER — VITAMIN D 25 MCG (1000 UNIT) PO TABS
1000.0000 [IU] | ORAL_TABLET | Freq: Every day | ORAL | Status: DC
  Administered 2020-06-15 – 2020-07-11 (×27): 1000 [IU] via ORAL
  Filled 2020-06-14 (×27): qty 1

## 2020-06-14 MED ORDER — ACETAMINOPHEN 325 MG PO TABS
650.0000 mg | ORAL_TABLET | ORAL | Status: DC | PRN
  Administered 2020-06-28 – 2020-07-01 (×4): 650 mg via ORAL
  Filled 2020-06-14 (×4): qty 2

## 2020-06-14 MED ORDER — BISACODYL 10 MG RE SUPP
10.0000 mg | Freq: Every day | RECTAL | Status: DC | PRN
  Filled 2020-06-14 (×2): qty 1

## 2020-06-14 NOTE — Progress Notes (Signed)
Inpatient Rehabilitation  Patient information reviewed and entered into eRehab system by Seeley Southgate M. Adiah Guereca, M.A., CCC/SLP, PPS Coordinator.  Information including medical coding, functional ability and quality indicators will be reviewed and updated through discharge.    

## 2020-06-14 NOTE — Progress Notes (Signed)
Inpatient Rehabilitation Medication Review by a Pharmacist  A complete drug regimen review was completed for this patient to identify any potential clinically significant medication issues.  Clinically significant medication issues were identified:  yes   Type of Medication Issue Identified Description of Issue Urgent (address now) Non-Urgent (address on AM team rounds) Plan Plan Accepted by Provider? (Yes / No / Pending AM Rounds)  Drug Interaction(s) (clinically significant)       Duplicate Therapy       Allergy       No Medication Administration End Date       Incorrect Dose       Additional Drug Therapy Needed       Other   Has been refusing Atorvastatin 80 mg PO daily, since prescribed 06/05/20 at [facility];  Also not using Hydrocortisone cream MWF (using own lotion?) > prior med list includes Ketoconazole cream daily and shampoo QOD.  Discharge summary also indicates plan to continue B-12 1000 mcg daily, as prior to [facility] admit Non-urgent  Informed D. Trego, PA-C via secure chat.   B-12 ordered    For non-urgent medication issues to be resolved on team rounds tomorrow morning a [facility] Secure Chat Handoff was sent to:   D. Coffee, PA-C  Pharmacist comments:  Will follow up for additional orders, Atrovastatin use/non-use and any need to modify lipid therapy.  May need to clarify home lotion or cream.    Time spent performing this drug regimen review (minutes):  15 minutes   Arty Baumgartner, [place] 06/14/2020 3:58 PM

## 2020-06-14 NOTE — Progress Notes (Signed)
Patient arrived on unit, oriented to unit. Reviewed medications, therapy schedule, rehab routine and plan of care. States an understanding of information reviewed. No complications noted at this time. Patient reports no pain and is AX4 Peter Ryan  

## 2020-06-14 NOTE — Discharge Summary (Signed)
Physician Discharge Summary  Peter Ryan NOI:[id number] DOB: 09-15-1951 DOA: 06/05/2020  PCP: [name]., MD  Admit date: 06/05/2020 Discharge date: 06/14/2020  Admitted From: Home Disposition: CIR  Recommendations for Outpatient Follow-up:  1. Follow up with PCP in 1-2 weeks 2. Follow-up with GI in 2 weeks 3. Follow-up with cardiology/EP 2 weeks 4. Establish care with a neurologist  Home Health: No Equipment/Devices: None Discharge Condition: Stable CODE STATUS: Full Diet recommendation: Heart Healthy / Carb Modified Brief/Interim Summary: Peter Ryan a 69 y.o.malewith medical history significant forobesity, BPH, hypertension, diabetes mellitus, dyslipidemia and non-Hodgkin's lymphoma status post chemotherapy currently under surveillance.He presented to the hospital because of right-sided weaknessthatstarted on the day prior to admission. He still felt weak in his right leg the following day and he fell on the floor when he tried to get up to use the bathroom. His wife also noticed slurred speech.  MRI brain showed acute left pontine and right frontal white matter infarcts. He was on low-dose aspirin at home and neurologist has recommended aspirin be increased to 325 mg daily.  9/8: Patient seen and examined.  Wife at bedside.  Complains of weakness.  9/9: Patient seen and examined.  Wife remains at bedside.  Lengthy conversation about slow GI bleed.  Hemoglobin has been stable.  At this time we elect to defer GI evaluation outpatient.  Patient and wife were given clear instructions regarding need for urgent medical attention should bleed acutely worsen.  Approximately 1230 received a call from bedside RN with concerns of patient had worsening neurologic exam and deficits.  Worsening right-sided facial droop.  Decreased grip strength on right.  Discussed with on-call neurologist who recommended stat MRI brain.  Ordered.  Currently pending  9/10:  Patient seen and examined.  Patient's case discussed with Dr. Rockey Situ from cardiology.  TEE today.  No acute findings.  Cardiology recommended EP evaluation which can be deferred outpatient for consideration of a reveal device essentially an implantable loop recorder.  Discussed plan of care with patient and wife at bedside today.  Plan of care also discussed with neurology consultant.  9/11: Patient seen and examined.  TEE complete yesterday.  No acute findings.  Outpatient EP follow-up for reveal device consideration.  Plan remains to get patient to CIR.  Unfortunately is what happened over the weekend.  9/12: Patient seen and examined.  No acute events overnight.  Wife at bedside.  Neurologic exam slightly improved.  9/13: Patient seen and examined.  No acute events overnight.  Wife at bedside.  Neurologic exam slightly improved.  Grip strength on the right improving.  No beds available at CIR today  9/14: Patient seen and examined.  Awaiting bed at CIR.  Anticipate tomorrow.  Wife at bedside.  No complaints.  9/15: Bed available in CIR.  Patient discharged in stable condition.  Discharge Diagnoses:  Principal Problem:   Acute CVA (cerebrovascular accident) [name]) Active Problems:   Type 2 diabetes mellitus (HCC)   Benign essential hypertension   Obesity, Class III, BMI 40-49.9 (morbid obesity) ([place])  Acute CVA, left pontine acute infarction 2 days prior to admission Not a TPA candidate given last known time well MRI confirmed infarction CTA head and neck: No LVO EKG normal sinus rhythm Transthoracic echocardiogram reassuring 9/9: Worsening neurologic symptoms noted.  Repeat stat MRI brain ordered -Repeat MRI brain demonstrates increased diffusion-weighted signal without hemorrhage or mass-effect -TEE 9/10 did not reveal any PFO, ASD Plan: BP management, home medications restarted Continue aspirin 325  mg p.o. daily Continue Lipitor PT and OT as tolerated Frequent neuro  checks SLP evaluation -Cardiology has arranged for outpatient evaluation by EP for consideration of implantable loop recorder, reveal device  Patient could be a good candidate for dual antiplatelet therapy can considering severe intracranial atherosclerosis.  Will defer this addition for now.  Patient is currently undergoing outpatient work-up for suspected GI bleed.  I recommend that the patient go back and see the gastroenterologist to achieve resolution on GI bleed.  Once this is achieved outpatient neurology can evaluate for need regarding dual antiplatelet therapy  Lower GI bleed Patient has had intermittent BRBPR Hemoglobin has been stable Rectal bleeding has been intermittent and small-volume Could be hemorrhoidal versus possibly diverticular  Lengthy conversation with the patient and wife at bedside.  Explained that endoscopic evaluation likely involve a colonoscopy and associated bowel prep.  I offered to reach out to the gastroenterologist here.  At this time patient and wife are electing to defer this procedure to outpatient.  I feel this is acceptable choice.  Patient's hemoglobin has been relatively stable and given his recent infarct he is significantly debilitated.  Clear return to ED instructions have been provided should patient's bleed acutely worsen.   Hypertension Resume amlodipine, home dose 10 mg Resume losartan 100 mg nightly  Type 2 diabetes mellitus On sliding scale insulin before meals and at bedtime Can resume home regimen at discharge  Morbid obesity Dietitian consult  Gout Continue allopurinol  Discharge Instructions  Discharge Instructions    Diet - low sodium heart healthy   Complete by: As directed    Increase activity slowly   Complete by: As directed      Allergies as of 06/14/2020      Reactions   Metformin Other (See Comments)   Stomach upset      Medication List    TAKE these medications   allopurinol 300 MG tablet Commonly  known as: ZYLOPRIM TAKE ONE TABLET EVERY DAY   amLODipine 10 MG tablet Commonly known as: NORVASC TAKE ONE TABLET BY MOUTH EVERY DAY What changed: when to take this   aspirin 325 MG EC tablet Take 1 tablet (325 mg total) by mouth daily. Start taking on: June 15, 2020 What changed:   medication strength  how much to take   atorvastatin 80 MG tablet Commonly known as: LIPITOR Take 1 tablet (80 mg total) by mouth daily. Start taking on: June 15, 2020   finasteride 5 MG tablet Commonly known as: PROSCAR TAKE ONE TABLET BY MOUTH EVERY DAY   ketoconazole 2 % shampoo Commonly known as: NIZORAL Apply 1 application topically every other day. (wash face and scalp)   ketoconazole 2 % cream Commonly known as: NIZORAL Apply 1 application topically daily.   losartan 100 MG tablet Commonly known as: COZAAR TAKE ONE TABLET EVERY DAY BY MOUTH What changed: See the new instructions.   vitamin B-12 1000 MCG tablet Commonly known as: CYANOCOBALAMIN Take 1,000 mcg by mouth daily.   Vitamin D3 25 MCG (1000 UT) Caps Take 1,000 Units by mouth daily.       Allergies  Allergen Reactions  . Metformin Other (See Comments)    Stomach upset     Consultations:  Cardiology Neurology   Procedures/Studies: CT Angio Head W or Wo Contrast  Result Date: 06/05/2020 CLINICAL DATA:  Right-sided weakness for 2 days with a fall today. Facial droop and slurred speech. EXAM: CT ANGIOGRAPHY HEAD AND NECK TECHNIQUE: Multidetector CT imaging of  the head and neck was performed using the standard protocol during bolus administration of intravenous contrast. Multiplanar CT image reconstructions and MIPs were obtained to evaluate the vascular anatomy. Carotid stenosis measurements (when applicable) are obtained utilizing NASCET criteria, using the distal internal carotid diameter as the denominator. CONTRAST:  3mL OMNIPAQUE IOHEXOL 350 MG/ML SOLN COMPARISON:  None. FINDINGS: CTA NECK FINDINGS  Aortic arch: Standard 3 vessel aortic arch with widely patent arch vessel origins. Right carotid system: Patent with mild calcified and soft plaque at the carotid bifurcation. No evidence of significant stenosis or dissection. Left carotid system: Patent with mild calcified and soft plaque at the carotid bifurcation. No evidence of significant stenosis or dissection. Vertebral arteries: The vertebral arteries are patent with the right being strongly dominant. There is the suggestion of a moderate proximal right V1 stenosis although shoulder artifact may also be contributing to this appearance. Skeleton: Cervical spondylosis most notable at C6-7 where asymmetric left uncovertebral spurring results in moderate left neural foraminal stenosis. Other neck: No evidence of cervical lymphadenopathy or mass. Upper chest: Clear lung apices. Review of the MIP images confirms the above findings CTA HEAD FINDINGS Anterior circulation: The internal carotid arteries are patent from skull base to carotid termini with mild atherosclerotic plaque bilaterally not resulting in significant stenosis. ACAs and MCAs are patent without evidence of a proximal branch occlusion or significant A1 or M1 stenosis. There is atherosclerotic irregularity of the branch vessels as well as a focal severe proximal left M2 branch stenosis. There is a median artery of the corpus callosum with a severe stenosis distally. No aneurysm is identified. Posterior circulation: The intracranial vertebral arteries are patent to the basilar with mild atherosclerotic narrowing on the right. The left V4 segment is diffusely small with multiple superimposed stenoses including a severe stenosis distally. Patent AICA and SCA origins are visualized bilaterally. The basilar artery is widely patent. There is a fetal origin of the right PCA. Both PCAs demonstrate mild diffuse atherosclerotic irregularity without evidence of a flow limiting proximal stenosis. No aneurysm is  identified. Venous sinuses: Patent. Anatomic variants: Strongly dominant right vertebral artery. Median artery of the corpus callosum. Review of the MIP images confirms the above findings IMPRESSION: 1. No large vessel occlusion. 2. Intracranial atherosclerosis including severe stenoses of a left M2 branch vessel and of the median artery of the corpus callosum. 3. Strongly dominant right vertebral artery with possible moderate proximal V1 stenosis. 4. Hypoplastic left vertebral artery with a severe distal V4 stenosis. 5. Widely patent cervical carotid arteries. Electronically Signed   By: Logan Bores M.D.   On: 06/05/2020 15:50   CT HEAD WO CONTRAST  Result Date: 06/05/2020 CLINICAL DATA:  Right-sided weakness for 2 days.  Fall this morning. EXAM: CT HEAD WITHOUT CONTRAST TECHNIQUE: Contiguous axial images were obtained from the base of the skull through the vertex without intravenous contrast. COMPARISON:  Limited correlation made with PET-CT 05/08/2018. FINDINGS: Brain: There is no evidence of acute intracranial hemorrhage, mass lesion, brain edema or extra-axial fluid collection. The ventricles and subarachnoid spaces are appropriately sized for age. There is no CT evidence of acute cortical infarction. There is patchy and confluent low-density in the periventricular white matter bilaterally, most consistent with chronic small vessel ischemic changes. There is mild involvement of the right basal ganglia and left thalamus. Vascular: Intracranial vascular calcifications. No hyperdense vessel identified. Skull: Negative for fracture or focal lesion. Sinuses/Orbits: The visualized paranasal sinuses and mastoid air cells are clear. No orbital abnormalities  are seen. Other: None. IMPRESSION: 1. No acute intracranial findings. 2. Chronic small vessel ischemic changes in the periventricular white matter and basal ganglia. Electronically Signed   By: Richardean Sale M.D.   On: 06/05/2020 11:38   CT Angio Neck W  and/or Wo Contrast  Result Date: 06/05/2020 CLINICAL DATA:  Right-sided weakness for 2 days with a fall today. Facial droop and slurred speech. EXAM: CT ANGIOGRAPHY HEAD AND NECK TECHNIQUE: Multidetector CT imaging of the head and neck was performed using the standard protocol during bolus administration of intravenous contrast. Multiplanar CT image reconstructions and MIPs were obtained to evaluate the vascular anatomy. Carotid stenosis measurements (when applicable) are obtained utilizing NASCET criteria, using the distal internal carotid diameter as the denominator. CONTRAST:  11mL OMNIPAQUE IOHEXOL 350 MG/ML SOLN COMPARISON:  None. FINDINGS: CTA NECK FINDINGS Aortic arch: Standard 3 vessel aortic arch with widely patent arch vessel origins. Right carotid system: Patent with mild calcified and soft plaque at the carotid bifurcation. No evidence of significant stenosis or dissection. Left carotid system: Patent with mild calcified and soft plaque at the carotid bifurcation. No evidence of significant stenosis or dissection. Vertebral arteries: The vertebral arteries are patent with the right being strongly dominant. There is the suggestion of a moderate proximal right V1 stenosis although shoulder artifact may also be contributing to this appearance. Skeleton: Cervical spondylosis most notable at C6-7 where asymmetric left uncovertebral spurring results in moderate left neural foraminal stenosis. Other neck: No evidence of cervical lymphadenopathy or mass. Upper chest: Clear lung apices. Review of the MIP images confirms the above findings CTA HEAD FINDINGS Anterior circulation: The internal carotid arteries are patent from skull base to carotid termini with mild atherosclerotic plaque bilaterally not resulting in significant stenosis. ACAs and MCAs are patent without evidence of a proximal branch occlusion or significant A1 or M1 stenosis. There is atherosclerotic irregularity of the branch vessels as well as a  focal severe proximal left M2 branch stenosis. There is a median artery of the corpus callosum with a severe stenosis distally. No aneurysm is identified. Posterior circulation: The intracranial vertebral arteries are patent to the basilar with mild atherosclerotic narrowing on the right. The left V4 segment is diffusely small with multiple superimposed stenoses including a severe stenosis distally. Patent AICA and SCA origins are visualized bilaterally. The basilar artery is widely patent. There is a fetal origin of the right PCA. Both PCAs demonstrate mild diffuse atherosclerotic irregularity without evidence of a flow limiting proximal stenosis. No aneurysm is identified. Venous sinuses: Patent. Anatomic variants: Strongly dominant right vertebral artery. Median artery of the corpus callosum. Review of the MIP images confirms the above findings IMPRESSION: 1. No large vessel occlusion. 2. Intracranial atherosclerosis including severe stenoses of a left M2 branch vessel and of the median artery of the corpus callosum. 3. Strongly dominant right vertebral artery with possible moderate proximal V1 stenosis. 4. Hypoplastic left vertebral artery with a severe distal V4 stenosis. 5. Widely patent cervical carotid arteries. Electronically Signed   By: Logan Bores M.D.   On: 06/05/2020 15:50   MR BRAIN WO CONTRAST  Result Date: 06/05/2020 CLINICAL DATA:  Right-sided weakness for 2 days. EXAM: MRI HEAD WITHOUT CONTRAST TECHNIQUE: Multiplanar, multiecho pulse sequences of the brain and surrounding structures were obtained without intravenous contrast. COMPARISON:  Head CT and CTA 06/05/2020 FINDINGS: Brain: There are patchy acute infarcts in the left paramedian pons, and there is an 8 mm acute right frontal white matter infarct inferior to  the frontal horn. Chronic microhemorrhages in the right greater than left thalami are likely secondary to chronic hypertension. There is also a linear region of chronic hemorrhage in  the right external capsule. Patchy to confluent T2 hyperintensities in the cerebral white matter bilaterally are nonspecific but compatible with severely age advanced chronic small vessel ischemic disease. There are chronic lacunar infarcts in the thalami, right basal ganglia, and bilateral cerebral white matter. There is mildly age advanced cerebral atrophy. No mass, midline shift, or extra-axial fluid collection is identified. Vascular: Abnormal appearance of the distal left vertebral artery with severe atherosclerotic stenosis shown on earlier CTA. Other major intracranial vascular flow voids are preserved. Skull and upper cervical spine: Unremarkable bone marrow signal. Sinuses/Orbits: Right cataract extraction. Paranasal sinuses and mastoid air cells are clear. Other: None. IMPRESSION: 1. Acute left pontine and right frontal white matter infarcts. 2. Severe chronic small vessel ischemic disease. Electronically Signed   By: Logan Bores M.D.   On: 06/05/2020 17:53   MR BRAIN W WO CONTRAST  Result Date: 06/08/2020 CLINICAL DATA:  69 year old male with slurred speech, right side weakness. Left paracentral pontine and right frontal horn white matter infarcts on MRI 3 days ago. Severe stenosis left MCA M2 branch on CTA 3 days ago. EXAM: MRI HEAD WITHOUT AND WITH CONTRAST TECHNIQUE: Multiplanar, multiecho pulse sequences of the brain and surrounding structures were obtained without and with intravenous contrast. CONTRAST:  57mL GADAVIST GADOBUTROL 1 MMOL/ML IV SOLN COMPARISON:  CT head and CTA head and neck 06/05/2020. Brain MRI 06/05/2020. FINDINGS: Brain: Progressive restricted diffusion in the left pons, involving a fairly extensive 2.6 cm area of the brainstem there (series 2, image 20). Associated T2 and FLAIR hyperintensity. No associated hemorrhage or mass effect. Stable to slightly faded restricted diffusion in the white matter adjacent to the right frontal horn on series 2, image 30 today. No new areas of  restriction. Stable gray and white matter signal elsewhere with extensive white matter T2 and FLAIR hyperintensity, occasional cystic encephalomalacia of the white matter (left corona radiata series 9, image 18). No cortical encephalomalacia identified. Chronic lacunar infarcts in the bilateral thalami and right basal ganglia. Right thalamic chronic microhemorrhage. No midline shift, mass effect, evidence of mass lesion, ventriculomegaly, extra-axial collection or acute intracranial hemorrhage. Cervicomedullary junction and pituitary are within normal limits. No abnormal enhancement identified. No dural thickening. Vascular: Major intracranial vascular flow voids are stable. Skull and upper cervical spine: Negative visible cervical spine, bone marrow signal. Sinuses/Orbits: Stable, negative. Other: Mastoids remain clear. Grossly normal visible internal auditory structures. Scalp and face appear negative. IMPRESSION: 1. Moderate size left pontine infarct with increased diffusion restriction compared to 3 days ago, but no associated hemorrhage or mass effect. 2. Stable to perhaps slightly faded acute white matter lacunar infarct adjacent to the right frontal lobe. 3. No new intracranial abnormality. Underlying advanced chronic small vessel disease. Electronically Signed   By: Genevie Ann M.D.   On: 06/08/2020 14:15   ECHOCARDIOGRAM COMPLETE  Result Date: 06/06/2020    ECHOCARDIOGRAM REPORT   Patient Name:   Peter Ryan Date of Exam: 06/05/2020 Medical Rec #:  [id number]       Height:       70.0 in Accession #:    [id number]      Weight:       284.6 lb Date of Birth:  02/03/1951       BSA:          2.425 m Patient Age:  69 years        BP:           136/63 mmHg Patient Gender: M               HR:           76 bpm. Exam Location:  [facility] Procedure: 2D Echo, Cardiac Doppler and Color Doppler Indications:     Stroke 434.91 / I163.9  History:         Patient has prior history of Echocardiogram examinations. Risk                   Factors:Hypertension and Diabetes.  Sonographer:     Alyse Low Roar Referring Phys:  [id number] PJASNKNL AGBATA Diagnosing Phys: Nelva Bush MD IMPRESSIONS  1. Left ventricular ejection fraction, by estimation, is >55%. The left ventricle has normal function. Left ventricular endocardial border not optimally defined to evaluate regional wall motion. There is mild left ventricular hypertrophy. Left ventricular diastolic parameters are consistent with Grade I diastolic dysfunction (impaired relaxation).  2. Right ventricular systolic function is normal. The right ventricular size is normal.  3. The mitral valve is grossly normal. No evidence of mitral valve regurgitation.  4. The aortic valve was not well visualized. Aortic valve regurgitation is not visualized. No aortic stenosis is present. FINDINGS  Left Ventricle: Left ventricular ejection fraction, by estimation, is >55%. The left ventricle has normal function. Left ventricular endocardial border not optimally defined to evaluate regional wall motion. The left ventricular internal cavity size was  normal in size. There is mild left ventricular hypertrophy. Left ventricular diastolic parameters are consistent with Grade I diastolic dysfunction (impaired relaxation). Right Ventricle: The right ventricular size is normal. No increase in right ventricular wall thickness. Right ventricular systolic function is normal. Left Atrium: Left atrial size was not well visualized. Right Atrium: Right atrial size was not well visualized. Pericardium: Trivial pericardial effusion is present. Mitral Valve: The mitral valve is grossly normal. No evidence of mitral valve regurgitation. Tricuspid Valve: The tricuspid valve is not well visualized. Tricuspid valve regurgitation is not demonstrated. Aortic Valve: The aortic valve was not well visualized. Aortic valve regurgitation is not visualized. No aortic stenosis is present. Aortic valve peak gradient measures 5.2 mmHg.  Pulmonic Valve: The pulmonic valve was not well visualized. Pulmonic valve regurgitation is not visualized. No evidence of pulmonic stenosis. Aorta: The aortic root is normal in size and structure. Pulmonary Artery: The pulmonary artery is not well seen. Venous: The inferior vena cava was not well visualized. IAS/Shunts: The interatrial septum was not well visualized.  LEFT VENTRICLE PLAX 2D LVIDd:         5.33 cm  Diastology LVIDs:         4.02 cm  LV e' lateral:   6.96 cm/s LV PW:         1.11 cm  LV E/e' lateral: 10.4 LV IVS:        1.12 cm  LV e' medial:    6.96 cm/s LVOT diam:     2.30 cm  LV E/e' medial:  10.4 LVOT Area:     4.15 cm  RIGHT VENTRICLE RV S prime:     13.40 cm/s LEFT ATRIUM         Index LA diam:    4.20 cm 1.73 cm/m  AORTIC VALVE                PULMONIC VALVE AV Area (Vmax): 3.27 cm  PV Vmax:        0.96 m/s AV Vmax:        114.00 cm/s PV Peak grad:   3.7 mmHg AV Peak Grad:   5.2 mmHg    RVOT Peak grad: 2 mmHg LVOT Vmax:      89.70 cm/s  AORTA Ao Root diam: 3.40 cm MITRAL VALVE MV Area (PHT): 4.71 cm    SHUNTS MV Decel Time: 161 msec    Systemic Diam: 2.30 cm MV E velocity: 72.70 cm/s MV A velocity: 93.00 cm/s MV E/A ratio:  0.78 MV A Prime:    12.1 cm/s Nelva Bush MD Electronically signed by Nelva Bush MD Signature Date/Time: 06/06/2020/6:34:37 PM    Final    ECHO TEE  Result Date: 06/09/2020    TRANSESOPHOGEAL ECHO REPORT   Patient Name:   Peter Ryan Date of Exam: 06/09/2020 Medical Rec #:  [id number]       Height:       70.0 in Accession #:    [id number]      Weight:       284.6 lb Date of Birth:  [birth year]       BSA:          2.425 m Patient Age:    41 years        BP:           103/53 mmHg Patient Gender: M               HR:           78 bpm. Exam Location:  [facility] Procedure: Transesophageal Echo, Color Doppler, Cardiac Doppler and [place]            Contrast Bubble Study Indications:     Stroke 434.91  History:         Patient has prior history of Echocardiogram  examinations, most                  recent 06/05/2020. Risk Factors:Diabetes and Hypertension.  Sonographer:     Sherrie Sport RDCS (AE) Referring Phys:  [place] Diagnosing Phys: Ida Rogue MD PROCEDURE: After discussion of the risks and benefits of a TEE, an informed consent was obtained from the patient. TEE procedure time was 35 minutes. The transesophogeal probe was passed without difficulty through the esophogus of the patient. Imaged were obtained with the patient in a left lateral decubitus position. Local oropharyngeal anesthetic was provided with Benzocaine spray and Cetacaine. Sedation performed by performing physician. Image quality was excellent. The patient's vital signs; including heart rate, blood pressure, and oxygen saturation; remained stable throughout the procedure. The patient developed no complications during the procedure. IMPRESSIONS  1. Left ventricular ejection fraction, by estimation, is 55 %. The left ventricle has normal function. The left ventricle has no regional wall motion abnormalities.  2. Right ventricular systolic function is normal. The right ventricular size is normal.  3. No left atrial/left atrial appendage thrombus was detected.  4. Negative saline contrast bubble study, unable to test with valsalva. Conclusion(s)/Recommendation(s): Normal biventricular function without evidence of hemodynamically significant valvular heart disease. FINDINGS  Left Ventricle: Left ventricular ejection fraction, by estimation, is 55 to 60%. The left ventricle has normal function. The left ventricle has no regional wall motion abnormalities. The left ventricular internal cavity size was normal in size. There is  no left ventricular hypertrophy. Right Ventricle: The right ventricular size is normal. No increase in right ventricular wall thickness. Right ventricular  systolic function is normal. Left Atrium: Left atrial size was normal in size. No left atrial/left atrial appendage  thrombus was detected. Right Atrium: Right atrial size was normal in size. Pericardium: There is no evidence of pericardial effusion. Mitral Valve: The mitral valve is normal in structure. No evidence of mitral valve regurgitation. No evidence of mitral valve stenosis. Tricuspid Valve: The tricuspid valve is normal in structure. Tricuspid valve regurgitation is not demonstrated. No evidence of tricuspid stenosis. Aortic Valve: The aortic valve is normal in structure. Aortic valve regurgitation is not visualized. No aortic stenosis is present. Pulmonic Valve: The pulmonic valve was normal in structure. Pulmonic valve regurgitation is not visualized. No evidence of pulmonic stenosis. Aorta: The aortic root is normal in size and structure. Venous: The inferior vena cava is normal in size with greater than 50% respiratory variability, suggesting right atrial pressure of 3 mmHg. IAS/Shunts: No atrial level shunt detected by color flow Doppler. Agitated saline contrast was given intravenously to evaluate for intracardiac shunting. There is no evidence of an atrial septal defect. Ida Rogue MD Electronically signed by Ida Rogue MD Signature Date/Time: 06/09/2020/5:05:41 PM    Final     (Echo, Carotid, EGD, Colonoscopy, ERCP)    Subjective: Seen and examined on day of discharge.  Neurologic exam slowly improving.  Patient stable no distress.  Wife at bedside.  All questions answered.  Patient discharged to CIR in stable condition.  Discharge Exam: Vitals:   06/13/20 2356 06/14/20 0717  BP: (!) 158/65 137/67  Pulse:  66  Resp: 16 16  Temp: 97.7 F (36.5 C) 98.1 F (36.7 C)  SpO2:  97%   Vitals:   06/13/20 1500 06/13/20 2354 06/13/20 2356 06/14/20 0717  BP: (!) 153/62 (!) 158/65 (!) 158/65 137/67  Pulse: 76 72  66  Resp: 15 16 16 16   Temp: (!) 97.5 F (36.4 C) 97.7 F (36.5 C) 97.7 F (36.5 C) 98.1 F (36.7 C)  TempSrc: Oral Oral Oral Oral  SpO2: 97% 98%  97%  Weight:      Height:         General: Pt is alert, awake, not in acute distress Cardiovascular: RRR, S1/S2 +, no rubs, no gallops Respiratory: CTA bilaterally, no wheezing, no rhonchi Abdominal: Soft, NT, ND, bowel sounds + Extremities: no edema, no cyanosis    The results of significant diagnostics from this hospitalization (including imaging, microbiology, ancillary and laboratory) are listed below for reference.     Microbiology: Recent Results (from the past 240 hour(s))  SARS Coronavirus 2 by RT PCR (hospital order, performed in [facility] hospital lab) Nasopharyngeal Nasopharyngeal Swab     Status: None   Collection Time: 06/05/20  2:39 PM   Specimen: Nasopharyngeal Swab  Result Value Ref Range Status   SARS Coronavirus 2 NEGATIVE NEGATIVE Final    Comment: (NOTE) SARS-CoV-2 target nucleic acids are NOT DETECTED.  The SARS-CoV-2 RNA is generally detectable in upper and lower respiratory specimens during the acute phase of infection. The lowest concentration of SARS-CoV-2 viral copies this assay can detect is 250 copies / mL. A negative result does not preclude SARS-CoV-2 infection and should not be used as the sole basis for treatment or other patient management decisions.  A negative result may occur with improper specimen collection / handling, submission of specimen other than nasopharyngeal swab, presence of viral mutation(s) within the areas targeted by this assay, and inadequate number of viral copies (<250 copies / mL). A negative result must be  combined with clinical observations, patient history, and epidemiological information.  Fact Sheet for Patients:   StrictlyIdeas.no  Fact Sheet for Healthcare Providers: BankingDealers.co.za  This test is not yet approved or  cleared by the [place] FDA and has been authorized for detection and/or diagnosis of SARS-CoV-2 by FDA under an Emergency Use Authorization (EUA).  This EUA will  remain in effect (meaning this test can be used) for the duration of the COVID-19 declaration under Section 564(b)(1) of the Act, 21 U.S.C. section 360bbb-3(b)(1), unless the authorization is terminated or revoked sooner.  Performed at [facility], [address] [place], [place] [address]      Labs: BNP (last 3 results) No results for input(s): BNP in the last 8760 hours. Basic Metabolic Panel: No results for input(s): NA, K, CL, CO2, GLUCOSE, BUN, CREATININE, CALCIUM, MG, PHOS in the last 168 hours. Liver Function Tests: No results for input(s): AST, ALT, ALKPHOS, BILITOT, PROT, ALBUMIN in the last 168 hours. No results for input(s): LIPASE, AMYLASE in the last 168 hours. No results for input(s): AMMONIA in the last 168 hours. CBC: Recent Labs  Lab 06/07/20 1716 06/08/20 0507 06/09/20 0413 06/10/20 0407  WBC  --  7.2 8.8 8.6  NEUTROABS  --  4.4 5.2 5.5  HGB 12.1* 11.7* 12.0* 11.8*  HCT  --  32.8* 35.2* 33.5*  MCV  --  83.9 87.8 85.0  PLT  --  218 246 236   Cardiac Enzymes: No results for input(s): CKTOTAL, CKMB, CKMBINDEX, TROPONINI in the last 168 hours. BNP: Invalid input(s): POCBNP CBG: Recent Labs  Lab 06/13/20 1641 06/13/20 2001 06/13/20 2355 06/14/20 0309 06/14/20 0739  GLUCAP 217* 219* 180* 161* 170*   D-Dimer No results for input(s): DDIMER in the last 72 hours. Hgb A1c No results for input(s): HGBA1C in the last 72 hours. Lipid Profile No results for input(s): CHOL, HDL, LDLCALC, TRIG, CHOLHDL, LDLDIRECT in the last 72 hours. Thyroid function studies No results for input(s): TSH, T4TOTAL, T3FREE, THYROIDAB in the last 72 hours.  Invalid input(s): FREET3 Anemia work up No results for input(s): VITAMINB12, FOLATE, FERRITIN, TIBC, IRON, RETICCTPCT in the last 72 hours. Urinalysis    Component Value Date/Time   APPEARANCEUR Clear 07/18/2016 0927   GLUCOSEU Negative 07/18/2016 0927   BILIRUBINUR Negative 07/18/2016 0927   PROTEINUR  1+ (A) 07/18/2016 0927   UROBILINOGEN 0.2 06/14/2016 1419   NITRITE Negative 07/18/2016 0927   LEUKOCYTESUR Negative 07/18/2016 0927   Sepsis Labs Invalid input(s): PROCALCITONIN,  WBC,  LACTICIDVEN Microbiology Recent Results (from the past 240 hour(s))  SARS Coronavirus 2 by RT PCR (hospital order, performed in [facility] hospital lab) Nasopharyngeal Nasopharyngeal Swab     Status: None   Collection Time: 06/05/20  2:39 PM   Specimen: Nasopharyngeal Swab  Result Value Ref Range Status   SARS Coronavirus 2 NEGATIVE NEGATIVE Final    Comment: (NOTE) SARS-CoV-2 target nucleic acids are NOT DETECTED.  The SARS-CoV-2 RNA is generally detectable in upper and lower respiratory specimens during the acute phase of infection. The lowest concentration of SARS-CoV-2 viral copies this assay can detect is 250 copies / mL. A negative result does not preclude SARS-CoV-2 infection and should not be used as the sole basis for treatment or other patient management decisions.  A negative result may occur with improper specimen collection / handling, submission of specimen other than nasopharyngeal swab, presence of viral mutation(s) within the areas targeted by this assay, and inadequate number of viral copies (<  250 copies / mL). A negative result must be combined with clinical observations, patient history, and epidemiological information.  Fact Sheet for Patients:   StrictlyIdeas.no  Fact Sheet for Healthcare Providers: BankingDealers.co.za  This test is not yet approved or  cleared by the [place] FDA and has been authorized for detection and/or diagnosis of SARS-CoV-2 by FDA under an Emergency Use Authorization (EUA).  This EUA will remain in effect (meaning this test can be used) for the duration of the COVID-19 declaration under Section 564(b)(1) of the Act, 21 U.S.C. section 360bbb-3(b)(1), unless the authorization is terminated  or revoked sooner.  Performed at [facility], [address] [place], [place] [address]      Time coordinating discharge: Over 30 minutes  SIGNED:   Sidney Ace, MD  [facility] 06/14/2020, 11:55 AM Pager   If 7PM-7AM, please contact night-coverage

## 2020-06-14 NOTE — PMR Pre-admission (Addendum)
PMR Admission Coordinator Pre-Admission Assessment  Patient: Peter Ryan is an 70 y.o., male MRN: [id number] DOB: 05-17-1951 Height: 5\' 10"  (177.8 cm) Weight: 129.3 kg              Insurance Information HMO:     PPO:      PCP:      IPA:      80/20:      OTHER:  PRIMARY: Medicare A and B      Policy#: [id number]      Subscriber: pt CM Name:       Phone#:      Fax#:  Pre-Cert#: verified Civil engineer, contracting:  Benefits:  Phone #:      Name:  Eff. Date: 02/28/18 A and B     Deduct: $1484      Out of Pocket Max: n/a      Life Max: n/a  CIR: 100%      SNF: 20 full days Outpatient: 80%     Co-Pay: 20% Home Health: 100%      Co-Pay:  DME: 80%     Co-Pay: 20% Providers: pt choice SECONDARY: [employer]      Policy#: [id number]      Phone#: [phone]  Financial Counselor:      Phone#:   The "Data Collection Information Summary" for patients in Inpatient Rehabilitation Facilities with attached "Privacy Act [place] Records" was provided and verbally reviewed with: Patient and Family  Emergency Contact Information Contact Information     Name Relation Home Work [place] L Spouse [phone]       Current Medical History  Patient Admitting Diagnosis: L pontine CVA  History of Present Illness: Peter Ryan is a 69 year old right-handed male with history of obesity BMI 40.89, BPH, quit smoking 46 years ago, hypertension, diabetes mellitus, history of rectal bleeding, hyperlipidemia and non-Hodgkin's lymphoma status post chemotherapy currently in remission and under surveillance followed by Dr Randa Evens.  Presented 06/05/2020 with right side weakness and slurred speech.  Cranial CT scan negative for acute changes.  Patient did not receive TPA.  CT angiogram of head and neck with no large vessel occlusion.  MRI of the brain showed moderate size left pontine infarct but no associated hemorrhage or mass-effect.  Stable to perhaps slightly faded  acute white matter lacunar infarct adjacent to the right frontal lobe.  Admission chemistries potassium 3.4, glucose 252, SARS coronavirus negative, hemoglobin 11.9.  Echocardiogram with ejection fraction of 26% Grade 1 diastolic dysfunction. TEE with no acute findings and did not reveal any PFO.  Awaiting plan for loop recorder which will be discussed as outpatient with cardiology service Dr. Rockey Situ.  Currently maintained on aspirin for CVA prophylaxis.  No DAPT at this time due to history of rectal outlet bleeding.  Tolerating regular consistency diet.  Therapy evaluations completed and patient was recommended for a comprehensive rehab program.   Complete NIHSS TOTAL: 5  Past Medical History  Past Medical History:  Diagnosis Date   Arthritis    BPH (benign prostatic hyperplasia)    Cancer ([facility])    Non-Hodgkins lymphoma   Chronic prostatitis    Cirrhosis of liver ([place])    Diabetes mellitus without complication (HCC)    Dyslipidemia    Elevated PSA    Gastric reflux    Gout    HTN (hypertension)    Neuropathy    Over weight    Psoriasis    Testicular pain, right  Urinary frequency     Family History  family history includes Cancer in his maternal uncle and mother; Diabetes in his mother; Uterine cancer in his mother.  Prior Rehab/Hospitalizations:  Has the patient had prior rehab or hospitalizations prior to admission? No  Has the patient had major surgery during 100 days prior to admission? No  Current Medications   Current Facility-Administered Medications:     stroke: mapping our early stages of recovery book, , Does not apply, Once, Agbata, Tochukwu, MD   acetaminophen (TYLENOL) tablet 650 mg, 650 mg, Oral, Q4H PRN **OR** acetaminophen (TYLENOL) 160 MG/5ML solution 650 mg, 650 mg, Per Tube, Q4H PRN **OR** acetaminophen (TYLENOL) suppository 650 mg, 650 mg, Rectal, Q4H PRN, Agbata, Tochukwu, MD   allopurinol (ZYLOPRIM) tablet 300 mg, 300 mg, Oral, Daily, Agbata, Tochukwu,  MD, 300 mg at 06/14/20 0826   amLODipine (NORVASC) tablet 10 mg, 10 mg, Oral, Daily, Sreenath, Sudheer B, MD, 10 mg at 06/14/20 3785   aspirin suppository 300 mg, 300 mg, Rectal, Daily **OR** aspirin EC tablet 325 mg, 325 mg, Oral, Daily, Agbata, Tochukwu, MD, 325 mg at 06/14/20 0825   atorvastatin (LIPITOR) tablet 80 mg, 80 mg, Oral, Daily, Agbata, Tochukwu, MD   bisacodyl (DULCOLAX) suppository 10 mg, 10 mg, Rectal, Daily PRN, Priscella Mann, Sudheer B, MD   cholecalciferol (VITAMIN D3) tablet 1,000 Units, 1,000 Units, Oral, Daily, Agbata, Tochukwu, MD, 1,000 Units at 06/14/20 0825   finasteride (PROSCAR) tablet 5 mg, 5 mg, Oral, Daily, Agbata, Tochukwu, MD, 5 mg at 06/14/20 0825   hydrALAZINE (APRESOLINE) injection 10 mg, 10 mg, Intravenous, Q4H PRN, Priscella Mann, Sudheer B, MD   hydrocortisone cream 1 %, , Topical, Q M,W,F, Agbata, Tochukwu, MD, Given at 06/07/20 1000   insulin aspart (novoLOG) injection 0-20 Units, 0-20 Units, Subcutaneous, TID AC & HS, Sreenath, Sudheer B, MD, 4 Units at 06/14/20 0827   losartan (COZAAR) tablet 100 mg, 100 mg, Oral, Daily, Sreenath, Sudheer B, MD, 100 mg at 06/14/20 0826   polyethylene glycol (MIRALAX / GLYCOLAX) packet 17 g, 17 g, Oral, Daily, Sreenath, Sudheer B, MD, 17 g at 06/14/20 8850   protein supplement (ENSURE MAX) liquid, 11 oz, Oral, BID BM, Sreenath, Sudheer B, MD, 11 oz at 06/14/20 0826   senna-docusate (Senokot-S) tablet 1 tablet, 1 tablet, Oral, BID, Sreenath, Sudheer B, MD, 1 tablet at 06/14/20 0825   sodium chloride flush (NS) 0.9 % injection 3 mL, 3 mL, Intravenous, Once, Lucrezia Starch, MD  Facility-Administered Medications Ordered in Other Encounters:    sodium chloride flush (NS) 0.9 % injection 10 mL, 10 mL, Intravenous, PRN, Sindy Guadeloupe, MD, 10 mL at 05/12/18 1421  Patients Current Diet:  Diet Order             Diet - low sodium heart healthy           Diet heart healthy/carb modified Room service appropriate? Yes; Fluid consistency:  Thin  Diet effective now                   Precautions / Restrictions Precautions Precautions: Fall Restrictions Weight Bearing Restrictions: No Other Position/Activity Restrictions: protect weak RUE   Has the patient had 2 or more falls or a fall with injury in the past year?Yes  Prior Activity Level Community (5-7x/wk): no longer driving, retired Geophysicist/field seismologist, no DME at baseline  Prior Functional Level Prior Function Level of Independence: Independent with assistive device(s) Comments: Pt had been able to do moderate community ambulation, has been  seeing outpt PT for balance, strength, LBP  Self Care: Did the patient need help bathing, dressing, using the toilet or eating?  Independent  Indoor Mobility: Did the patient need assistance with walking from room to room (with or without device)? Independent  Stairs: Did the patient need assistance with internal or external stairs (with or without device)? Independent  Functional Cognition: Did the patient need help planning regular tasks such as shopping or remembering to take medications? Independent  Home Assistive Devices / Equipment Home Assistive Devices/Equipment: Environmental consultant (specify type), CBG Meter, Eyeglasses Home Equipment: Walker - 4 wheels, Cane - single point  Prior Device Use: Indicate devices/aids used by the patient prior to current illness, exacerbation or injury? None of the above  Current Functional Level Cognition  Arousal/Alertness: Awake/alert Overall Cognitive Status: Within Functional Limits for tasks assessed Orientation Level: Oriented X4 General Comments: wife states he is still a bit sleepy/groggy after having had procedure (TEE) earlier today Memory: Appears intact Problem Solving:  (NT) Executive Function: Sequencing, Reasoning, Armed forces logistics/support/administrative officer (wfl) Reasoning: Appears intact Sequencing: Appears intact Organizing: Appears intact    Extremity Assessment (includes Sensation/Coordination)  Upper  Extremity Assessment: RUE deficits/detail, LUE deficits/detail RUE Deficits / Details: shld flexion to 1/2 range, grip 4/5. Presence of dysdiadochokinesia RUE Sensation: decreased proprioception RUE Coordination: decreased fine motor, decreased gross motor LUE Deficits / Details: WFL, grip 5/5  Lower Extremity Assessment: Defer to PT evaluation, RLE deficits/detail RLE Deficits / Details: grossly weak with fxl assessment    ADLs  Overall ADL's : Needs assistance/impaired General ADL Comments: MAX A with LB ADLs in sitting, MIN/MOD A with UB ADLs in sitting. MOD +2 for SPS arm in arm t/f's    Mobility  Overal bed mobility: Needs Assistance Bed Mobility: Supine to Sit Rolling: Mod assist Sidelying to sit: Max assist, HOB elevated Supine to sit: Mod assist, +2 for physical assistance Sit to supine: +2 for physical assistance, Max assist General bed mobility comments: Max A +2 required for pt to move from sitting EOB to supine    Transfers  Overall transfer level: Needs assistance Equipment used: None Transfers: Stand Pivot Transfers, Sit to/from Stand Sit to Stand: +2 physical assistance, Max assist Stand pivot transfers: Max assist, +2 physical assistance General transfer comment: Transfer from recliner to bed, pivoting toward L side. Left hand on bed rail, Max A +2 with knees blocked to facilitate stand and several stand attempts required to fully position bottom on EOB    Ambulation / Gait / Stairs / Wheelchair Mobility  Ambulation/Gait General Gait Details: not attempted    Posture / Balance Dynamic Sitting Balance Sitting balance - Comments: sitting unsupported in chair, pt has several episodes of left lateral leaning, requiring MinA for righting Balance Overall balance assessment: Needs assistance Sitting-balance support: Feet supported, Single extremity supported Sitting balance-Leahy Scale: Fair Sitting balance - Comments: sitting unsupported in chair, pt has several  episodes of left lateral leaning, requiring MinA for righting Postural control: Posterior lean, Left lateral lean Standing balance support: Single extremity supported, During functional activity Standing balance-Leahy Scale: Poor Standing balance comment: R knee blocking for standing    Special needs/care consideration Diabetic management yes and Designated visitor wife, [place] (from acute therapy documentation) Living Arrangements: Spouse/significant other  Lives With: Spouse Available Help at Discharge: Family Type of Home: House Home Layout: One level Home Access: Stairs to enter Entrance Stairs-Rails: Right Entrance Stairs-Number of Steps: [place]: No Additional Comments:  reports he has not been driving since cataract surgery in July.  Discharge Living Setting Plans for Discharge Living Setting: Patient's home Type of Home at Discharge: House Discharge Home Layout: One level Discharge Home Access: Stairs to enter Entrance Stairs-Rails: Right Entrance Stairs-Number of Steps: 3 Discharge Bathroom Shower/Tub: Tub/shower unit, Walk-in shower (no room for shower seat in walk in shower?) Discharge Bathroom Toilet: Standard Discharge Bathroom Accessibility: No Does the patient have any problems obtaining your medications?: No  Social/Family/Support Systems Patient Roles: Spouse Anticipated Caregiver: Uziel Covault Anticipated Ambulance person Information: [phone] (cell) Ability/Limitations of Caregiver: supervision/min assist Caregiver Availability: 24/7 Discharge Plan Discussed with Primary Caregiver: Yes Is Caregiver In Agreement with Plan?: Yes Does Caregiver/Family have Issues with Lodging/Transportation while Pt is in Rehab?: No   Goals Patient/Family Goal for Rehab: PT/OT/SLP Min A Expected length of stay: 14-18 days Pt/Family Agrees to Admission and willing to participate: Yes Program Orientation Provided &  Reviewed with Pt/Caregiver Including Roles  & Responsibilities: Yes   Decrease burden of Care through IP rehab admission: n/a   Possible need for SNF placement upon discharge:not anticipated   Patient Condition: I have reviewed medical records from [facility], spoken with CM, and patient and spouse. I discussed via phone for inpatient rehabilitation assessment.  Patient will benefit from ongoing PT, OT and SLP, can actively participate in 3 hours of therapy a day 5 days of the week, and can make measurable gains during the admission.  Patient will also benefit from the coordinated team approach during an Inpatient Acute Rehabilitation admission.  The patient will receive intensive therapy as well as Rehabilitation physician, nursing, social worker, and care management interventions.  Due to bladder management, bowel management, safety, disease management, medication administration, pain management and patient education the patient requires 24 hour a day rehabilitation nursing.  The patient is currently mod +2 with mobility and basic ADLs.  Discharge setting and therapy post discharge at  home  is anticipated.  Patient has agreed to participate in the Acute Inpatient Rehabilitation Program and will admit today.  Preadmission Screen Completed By:  Michel Santee, PT, DPT 06/14/2020 10:42 AM ______________________________________________________________________   Discussed status with Dr. Posey Pronto on 06/14/20 at 11:15 AM  and received approval for admission today.  Admission Coordinator:  Michel Santee, PT, DPT time 11:15 AM Sudie Grumbling 06/14/20

## 2020-06-14 NOTE — H&P (Signed)
Physical Medicine and Rehabilitation Admission H&P    Chief Complaint  Patient presents with  . Weakness  . Aphasia  : HPI: Peter Ryan is a 69 year old right-handed male with history of obesity BMI 40.89, BPH, quit smoking 46 years ago, hypertension, diabetes mellitus, history of rectal bleeding, hyperlipidemia and non-Hodgkin's lymphoma status post chemotherapy currently in remission and under surveillance followed by Dr Randa Evens.  History taken from chart review and patient.  Patient lives with spouse.  1 level home 4 steps to entry.  Independent assistive device community ambulation.  He presented on 06/05/2020 with right hemiparesis and dysarthria.  Cranial CT unremarkable for acute intercranial process.  Patient did not receive TPA.  CT angiogram of head and neck with no large vessel occlusion.  MRI of the brain showed moderate sized left pontine infarct, no associated hemorrhage or mass-effect.  Stable to perhaps slightly faded acute white matter lacunar infarct adjacent to the right frontal lobe.  Admission chemistries potassium 3.4, glucose 252, SARS coronavirus negative, hemoglobin 11.9.  Echocardiogram with ejection fraction of 29%, grade 1 diastolic dysfunction.TEE with no acute findings and did not reveal any PFO.  Awaiting plan for loop recorder which will be discussed as outpatient with cardiology service Dr. Rockey Situ.  Currently maintained on aspirin for CVA prophylaxis.  No DAPT at this time due to history of rectal outlet bleeding.  Tolerating regular consistency diet.  Therapy evaluations completed and patient was admitted for a comprehensive rehab program.  Please see preadmission assessment from earlier today as well.  Review of Systems  Constitutional: Positive for malaise/fatigue. Negative for chills and fever.  HENT: Negative for hearing loss.   Eyes: Negative for double vision.  Respiratory: Negative for cough and shortness of breath.   Cardiovascular: Negative for  chest pain, palpitations and leg swelling.  Gastrointestinal: Positive for constipation. Negative for heartburn, nausea and vomiting.       GERD  Genitourinary: Positive for urgency. Negative for dysuria and flank pain.  Musculoskeletal: Positive for joint pain and myalgias.  Skin: Negative for rash.  Neurological: Positive for speech change, focal weakness and weakness.  All other systems reviewed and are negative.  Past Medical History:  Diagnosis Date  . Arthritis   . BPH (benign prostatic hyperplasia)   . Cancer ([place])    Non-Hodgkins lymphoma  . Chronic prostatitis   . Cirrhosis of liver ([place])   . Diabetes mellitus without complication ([place])   . Dyslipidemia   . Elevated PSA   . Gastric reflux   . Gout   . HTN (hypertension)   . Neuropathy   . Over weight   . Psoriasis   . Testicular pain, right   . Urinary frequency    Past Surgical History:  Procedure Laterality Date  . hydrocelectomy    . IR FLUORO GUIDE PORT INSERTION RIGHT  04/11/2017  . IR REMOVAL TUN ACCESS W/ PORT W/O FL MOD SED  05/29/2018  . PILONIDAL CYST EXCISION    . TEE WITHOUT CARDIOVERSION N/A 06/09/2020   Procedure: TRANSESOPHAGEAL ECHOCARDIOGRAM (TEE);  Surgeon: Minna Merritts, MD;  Location: [facility] ORS;  Service: Cardiovascular;  Laterality: N/A;   Family History  Problem Relation Age of Onset  . Uterine cancer Mother   . Diabetes Mother   . Cancer Mother   . Cancer Maternal Uncle   . Bladder Cancer Neg Hx   . Kidney disease Neg Hx   . Prostate cancer Neg Hx    Social History:  reports  that he quit smoking about 46 years ago. His smoking use included cigarettes. He has a 12.00 pack-year smoking history. He has never used smokeless tobacco. He reports that he does not drink alcohol and does not use drugs. Allergies:  Allergies  Allergen Reactions  . Metformin Other (See Comments)    Stomach upset    Medications Prior to Admission  Medication Sig Dispense Refill  . allopurinol (ZYLOPRIM)  300 MG tablet TAKE ONE TABLET EVERY DAY (Patient taking differently: Take 300 mg by mouth daily. ) 30 tablet 3  . amLODipine (NORVASC) 10 MG tablet TAKE ONE TABLET BY MOUTH EVERY DAY (Patient taking differently: Take 10 mg by mouth at bedtime. ) 30 tablet 5  . aspirin EC 81 MG tablet Take 81 mg by mouth daily.     . Cholecalciferol (VITAMIN D3) 1000 UNITS CAPS Take 1,000 Units by mouth daily.     . finasteride (PROSCAR) 5 MG tablet TAKE ONE TABLET BY MOUTH EVERY DAY (Patient taking differently: Take 5 mg by mouth daily. ) 90 tablet 3  . ketoconazole (NIZORAL) 2 % cream Apply 1 application topically daily.    Marland Kitchen ketoconazole (NIZORAL) 2 % shampoo Apply 1 application topically every other day. (wash face and scalp)    . losartan (COZAAR) 100 MG tablet TAKE ONE TABLET EVERY DAY BY MOUTH (Patient taking differently: Take 100 mg by mouth at bedtime. ) 90 tablet 1  . vitamin B-12 (CYANOCOBALAMIN) 1000 MCG tablet Take 1,000 mcg by mouth daily.      Drug Regimen Review Drug regimen was reviewed and remains appropriate with no significant issues identified  Home: Home Living Family/patient expects to be discharged to:: Inpatient rehab Living Arrangements: Spouse/significant other Available Help at Discharge: Family Type of Home: House Home Access: Stairs to enter [employer] of Steps: 4 Entrance Stairs-Rails: Right Home Layout: One level Home Equipment: [place] - 4 wheels, Cane - single point Additional Comments: reports he has not been driving since cataract surgery in July.  Lives With: Spouse   Functional History: Prior Function Level of Independence: Independent with assistive device(s) Comments: Pt had been able to do moderate community ambulation, has been seeing outpt PT for balance, strength, LBP  Functional Status:  Mobility: Bed Mobility Overal bed mobility: Needs Assistance Bed Mobility: Supine to Sit Rolling: Mod assist Sidelying to sit: Max assist, HOB  elevated Supine to sit: Mod assist, +2 for physical assistance Sit to supine: +2 for physical assistance, Max assist General bed mobility comments: Max A +2 required for pt to move from sitting EOB to supine Transfers Overall transfer level: Needs assistance Equipment used: None Transfers: Stand Pivot Transfers, Sit to/from Stand Sit to Stand: +2 physical assistance, Max assist Stand pivot transfers: Max assist, +2 physical assistance General transfer comment: Transfer from recliner to bed, pivoting toward L side. Left hand on bed rail, Max A +2 with knees blocked to facilitate stand and several stand attempts required to fully position bottom on EOB Ambulation/Gait General Gait Details: not attempted    ADL: ADL Overall ADL's : Needs assistance/impaired General ADL Comments: MAX A with LB ADLs in sitting, MIN/MOD A with UB ADLs in sitting. MOD +2 for SPS arm in arm t/f's  Cognition: Cognition Overall Cognitive Status: Within Functional Limits for tasks assessed Arousal/Alertness: Awake/alert Orientation Level: Oriented X4 Memory: Appears intact Immediate Memory Recall:  (wfl) Memory Recall Sock:  (NT) Memory Recall Blue:  (NT) Memory Recall Bed:  (NT) Problem Solving:  (NT) Executive Function: Sequencing,  Reasoning, Organizing (wfl) Reasoning: Appears intact Sequencing: Appears intact Organizing: Appears intact Cognition Arousal/Alertness: Awake/alert Behavior During Therapy: WFL for tasks assessed/performed Overall Cognitive Status: Within Functional Limits for tasks assessed General Comments: wife states he is still a bit sleepy/groggy after having had procedure (TEE) earlier today  Physical Exam: Blood pressure 137/67, pulse 66, temperature 98.1 F (36.7 C), temperature source Oral, resp. rate 16, height 5\' 10"  (1.778 m), weight 129.3 kg, SpO2 97 %. Physical Exam Vitals reviewed.  Constitutional:      General: He is not in acute distress.    Appearance: He is  obese. He is not ill-appearing.  HENT:     Head: Normocephalic and atraumatic.     Right Ear: External ear normal.     Left Ear: External ear normal.     Nose: Nose normal.  Eyes:     General:        Right eye: No discharge.        Left eye: No discharge.     Extraocular Movements: Extraocular movements intact.  Cardiovascular:     Rate and Rhythm: Normal rate and regular rhythm.  Pulmonary:     Effort: Pulmonary effort is normal. No respiratory distress.     Breath sounds: No stridor.  Abdominal:     General: Abdomen is flat. Bowel sounds are normal. There is no distension.  Musculoskeletal:     Cervical back: Normal range of motion and neck supple.     Comments: No edema or tenderness in extremities  Skin:    General: Skin is warm and dry.  Neurological:     Mental Status: He is alert.     Comments: Makes eye contact with examiner.   Dysarthria, but intelligible.   Alert and oriented x3 Motor: LUE/LLE: 5/5 proximal distal RUE: Shoulder abduction 2/5, distally 3/5 Right lower extremity: 2/5 proximal distal Sensation intact light touch Left facial weakness  Psychiatric:        Mood and Affect: Mood normal.        Behavior: Behavior normal.     Results for orders placed or performed during the hospital encounter of 06/05/20 (from the past 48 hour(s))  Glucose, capillary     Status: Abnormal   Collection Time: 06/12/20 11:35 AM  Result Value Ref Range   Glucose-Capillary 168 (H) 70 - 99 mg/dL    Comment: Glucose reference range applies only to samples taken after fasting for at least 8 hours.   Comment 1 Notify RN    Comment 2 Document in Chart   Glucose, capillary     Status: Abnormal   Collection Time: 06/12/20  5:00 PM  Result Value Ref Range   Glucose-Capillary 163 (H) 70 - 99 mg/dL    Comment: Glucose reference range applies only to samples taken after fasting for at least 8 hours.   Comment 1 Notify RN    Comment 2 Document in Chart   Glucose, capillary      Status: Abnormal   Collection Time: 06/12/20  7:43 PM  Result Value Ref Range   Glucose-Capillary 179 (H) 70 - 99 mg/dL    Comment: Glucose reference range applies only to samples taken after fasting for at least 8 hours.  Glucose, capillary     Status: Abnormal   Collection Time: 06/12/20 11:41 PM  Result Value Ref Range   Glucose-Capillary 178 (H) 70 - 99 mg/dL    Comment: Glucose reference range applies only to samples taken after fasting for at least  8 hours.  Glucose, capillary     Status: Abnormal   Collection Time: 06/13/20  3:32 AM  Result Value Ref Range   Glucose-Capillary 183 (H) 70 - 99 mg/dL    Comment: Glucose reference range applies only to samples taken after fasting for at least 8 hours.  Glucose, capillary     Status: Abnormal   Collection Time: 06/13/20  7:35 AM  Result Value Ref Range   Glucose-Capillary 189 (H) 70 - 99 mg/dL    Comment: Glucose reference range applies only to samples taken after fasting for at least 8 hours.   Comment 1 Notify RN   Glucose, capillary     Status: Abnormal   Collection Time: 06/13/20 11:45 AM  Result Value Ref Range   Glucose-Capillary 197 (H) 70 - 99 mg/dL    Comment: Glucose reference range applies only to samples taken after fasting for at least 8 hours.   Comment 1 Notify RN   Glucose, capillary     Status: Abnormal   Collection Time: 06/13/20  4:41 PM  Result Value Ref Range   Glucose-Capillary 217 (H) 70 - 99 mg/dL    Comment: Glucose reference range applies only to samples taken after fasting for at least 8 hours.   Comment 1 Notify RN   Glucose, capillary     Status: Abnormal   Collection Time: 06/13/20  8:01 PM  Result Value Ref Range   Glucose-Capillary 219 (H) 70 - 99 mg/dL    Comment: Glucose reference range applies only to samples taken after fasting for at least 8 hours.  Glucose, capillary     Status: Abnormal   Collection Time: 06/13/20 11:55 PM  Result Value Ref Range   Glucose-Capillary 180 (H) 70 - 99  mg/dL    Comment: Glucose reference range applies only to samples taken after fasting for at least 8 hours.  Glucose, capillary     Status: Abnormal   Collection Time: 06/14/20  3:09 AM  Result Value Ref Range   Glucose-Capillary 161 (H) 70 - 99 mg/dL    Comment: Glucose reference range applies only to samples taken after fasting for at least 8 hours.  Glucose, capillary     Status: Abnormal   Collection Time: 06/14/20  7:39 AM  Result Value Ref Range   Glucose-Capillary 170 (H) 70 - 99 mg/dL    Comment: Glucose reference range applies only to samples taken after fasting for at least 8 hours.   Comment 1 Notify RN    No results found.     Medical Problem List and Plan: 1.  Right hemiparesis secondary to left pontine infarction.  Follow-up outpatient cardiology services to discuss loop recorder  -patient may shower  -ELOS/Goals: 14-17 days/supervision/min a  Admit to CIR 2.  Antithrombotics: -DVT/anticoagulation: SCDs  -antiplatelet therapy: Aspirin 325 mg daily 3. Pain Management: Tylenol as needed 4. Mood: Provide emotional support  -antipsychotic agents: N/A 5. Neuropsych: This patient is capable of making decisions on his own behalf. 6. Skin/Wound Care: Routine skin checks 7. Fluids/Electrolytes/Nutrition: Routine in and outs.  CMP ordered for tomorrow. 8.  Hypertension.  Norvasc 10 mg daily, Cozaar 100 mg daily.    Monitor with increased mobility. 9.  Diabetes mellitus with hyperglycemia.  Hemoglobin A1c 7.5.  SSI.  Monitor with increased mobility. 10.  BPH.  Proscar.    Check PVRs 11.  Hyperlipidemia.  Lipitor 12.  History of gout.  Zyloprim 300 mg daily.  Monitor for any gout flareups 13.  History of non-Hodgkin's lymphoma status post chemotherapy.  Follow-up Dr Randa Evens as outpatient. 14.  Morbid obesity.  BMI 40.89.  Dietary follow-up 15.  History of rectal outlet bleeding.  Felt to be related to possible hemorrhoids.  Hemoglobin hematocrit mains stable.  He was  to follow-up outpatient gastroenterology services.  Patient had deferred GI work-up while in the hospital.  CBC ordered for tomorrow.  Lavon Paganini Angiulli, PA-C 06/14/2020  I have personally performed a face to face diagnostic evaluation, including, but not limited to relevant history and physical exam findings, of this patient and developed relevant assessment and plan.  Additionally, I have reviewed and concur with the physician assistant's documentation above.  Delice Lesch, MD, ABPMR  The patient's status has not changed. The original post admission physician evaluation remains appropriate, and any changes from the pre-admission screening or documentation from the acute chart are noted above.   Delice Lesch, MD, ABPMR

## 2020-06-14 NOTE — Progress Notes (Signed)
PMR Admission Coordinator Pre-Admission Assessment   Patient: Peter Ryan is an 69 y.o., male MRN: [id number] DOB: 1951/01/22 Height: 5\' 10"  (177.8 cm) Weight: 129.3 kg                                                                                                                                                  Insurance Information HMO:     PPO:      PCP:      IPA:      80/20:      OTHER:  PRIMARY: Medicare A and B      Policy#: [id number]      Subscriber: pt CM Name:       Phone#:      Fax#:  Pre-Cert#: verified Civil engineer, contracting:  Benefits:  Phone #:      Name:  Eff. Date: 02/28/18 A and B     Deduct: $1484      Out of Pocket Max: n/a      Life Max: n/a  CIR: 100%      SNF: 20 full days Outpatient: 80%     Co-Pay: 20% Home Health: 100%      Co-Pay:  DME: 80%     Co-Pay: 20% Providers: pt choice SECONDARY: [employer]      Policy#: [id number]      Phone#: [phone]   Financial Counselor:      Phone#:    The "Data Collection Information Summary" for patients in Inpatient Rehabilitation Facilities with attached "Privacy Act [place] Records" was provided and verbally reviewed with: Patient and Family   Emergency Contact Information Contact Information       Name Relation Home Work [place] L Spouse [phone]           Current Medical History  Patient Admitting Diagnosis: L pontine CVA   History of Present Illness: Peter Ryan is a 69 year old right-handed male with history of obesity BMI 40.89, BPH, quit smoking 46 years ago, hypertension, diabetes mellitus, history of rectal bleeding, hyperlipidemia and non-Hodgkin's lymphoma status post chemotherapy currently in remission and under surveillance followed by Dr Randa Evens.  Presented 06/05/2020 with right side weakness and slurred speech.  Cranial CT scan negative for acute changes.  Patient did not receive TPA.  CT angiogram of head and neck with no large vessel  occlusion.  MRI of the brain showed moderate size left pontine infarct but no associated hemorrhage or mass-effect.  Stable to perhaps slightly faded acute white matter lacunar infarct adjacent to the right frontal lobe.  Admission chemistries potassium 3.4, glucose 252, SARS coronavirus negative, hemoglobin 11.9.  Echocardiogram with ejection fraction of 83% Grade 1 diastolic dysfunction. TEE with no acute findings and did not reveal any PFO.  Awaiting plan for loop recorder which will be discussed  as outpatient with cardiology service Dr. Rockey Situ.  Currently maintained on aspirin for CVA prophylaxis.  No DAPT at this time due to history of rectal outlet bleeding.  Tolerating regular consistency diet.  Therapy evaluations completed and patient was recommended for a comprehensive rehab program.   Complete NIHSS TOTAL: 5   Past Medical History      Past Medical History:  Diagnosis Date  . Arthritis    . BPH (benign prostatic hyperplasia)    . Cancer ([place])      Non-Hodgkins lymphoma  . Chronic prostatitis    . Cirrhosis of liver ([place])    . Diabetes mellitus without complication ([place])    . Dyslipidemia    . Elevated PSA    . Gastric reflux    . Gout    . HTN (hypertension)    . Neuropathy    . Over weight    . Psoriasis    . Testicular pain, right    . Urinary frequency        Family History  family history includes Cancer in his maternal uncle and mother; Diabetes in his mother; Uterine cancer in his mother.   Prior Rehab/Hospitalizations:  Has the patient had prior rehab or hospitalizations prior to admission? No   Has the patient had major surgery during 100 days prior to admission? No   Current Medications    Current Facility-Administered Medications:  .   stroke: mapping our early stages of recovery book, , Does not apply, Once, Agbata, Tochukwu, MD .  acetaminophen (TYLENOL) tablet 650 mg, 650 mg, Oral, Q4H PRN **OR** acetaminophen (TYLENOL) 160 MG/5ML solution 650 mg, 650  mg, Per Tube, Q4H PRN **OR** acetaminophen (TYLENOL) suppository 650 mg, 650 mg, Rectal, Q4H PRN, Agbata, Tochukwu, MD .  allopurinol (ZYLOPRIM) tablet 300 mg, 300 mg, Oral, Daily, Agbata, Tochukwu, MD, 300 mg at 06/14/20 0826 .  amLODipine (NORVASC) tablet 10 mg, 10 mg, Oral, Daily, Sreenath, Sudheer B, MD, 10 mg at 06/14/20 0826 .  aspirin suppository 300 mg, 300 mg, Rectal, Daily **OR** aspirin EC tablet 325 mg, 325 mg, Oral, Daily, Agbata, Tochukwu, MD, 325 mg at 06/14/20 0825 .  atorvastatin (LIPITOR) tablet 80 mg, 80 mg, Oral, Daily, Agbata, Tochukwu, MD .  bisacodyl (DULCOLAX) suppository 10 mg, 10 mg, Rectal, Daily PRN, Priscella Mann, Sudheer B, MD .  cholecalciferol (VITAMIN D3) tablet 1,000 Units, 1,000 Units, Oral, Daily, Agbata, Tochukwu, MD, 1,000 Units at 06/14/20 0825 .  finasteride (PROSCAR) tablet 5 mg, 5 mg, Oral, Daily, Agbata, Tochukwu, MD, 5 mg at 06/14/20 0825 .  hydrALAZINE (APRESOLINE) injection 10 mg, 10 mg, Intravenous, Q4H PRN, Priscella Mann, Sudheer B, MD .  hydrocortisone cream 1 %, , Topical, Q M,W,F, Agbata, Tochukwu, MD, Given at 06/07/20 1000 .  insulin aspart (novoLOG) injection 0-20 Units, 0-20 Units, Subcutaneous, TID AC & HS, Ralene Muskrat B, MD, 4 Units at 06/14/20 0827 .  losartan (COZAAR) tablet 100 mg, 100 mg, Oral, Daily, Sreenath, Sudheer B, MD, 100 mg at 06/14/20 0826 .  polyethylene glycol (MIRALAX / GLYCOLAX) packet 17 g, 17 g, Oral, Daily, Sreenath, Sudheer B, MD, 17 g at 06/14/20 0828 .  protein supplement (ENSURE MAX) liquid, 11 oz, Oral, BID BM, Priscella Mann, Sudheer B, MD, 11 oz at 06/14/20 0826 .  senna-docusate (Senokot-S) tablet 1 tablet, 1 tablet, Oral, BID, Priscella Mann, Sudheer B, MD, 1 tablet at 06/14/20 0825 .  sodium chloride flush (NS) 0.9 % injection 3 mL, 3 mL, Intravenous, Once, Tamala Julian Ida Rogue, MD  Facility-Administered Medications Ordered in Other Encounters:  .  sodium chloride flush (NS) 0.9 % injection 10 mL, 10 mL, Intravenous, PRN, Sindy Guadeloupe, MD, 10 mL at 05/12/18 1421   Patients Current Diet:  Diet Order                  Diet - low sodium heart healthy             Diet heart healthy/carb modified Room service appropriate? Yes; Fluid consistency: Thin  Diet effective now                         Precautions / Restrictions Precautions Precautions: Fall Restrictions Weight Bearing Restrictions: No Other Position/Activity Restrictions: protect weak RUE    Has the patient had 2 or more falls or a fall with injury in the past year?Yes   Prior Activity Level Community (5-7x/wk): no longer driving, retired Geophysicist/field seismologist, no DME at baseline   Prior Functional Level Prior Function Level of Independence: Independent with assistive device(s) Comments: Pt had been able to do moderate community ambulation, has been seeing outpt PT for balance, strength, LBP   Self Care: Did the patient need help bathing, dressing, using the toilet or eating?  Independent   Indoor Mobility: Did the patient need assistance with walking from room to room (with or without device)? Independent   Stairs: Did the patient need assistance with internal or external stairs (with or without device)? Independent   Functional Cognition: Did the patient need help planning regular tasks such as shopping or remembering to take medications? Independent   Home Assistive Devices / Equipment Home Assistive Devices/Equipment: Environmental consultant (specify type), CBG Meter, Eyeglasses Home Equipment: Walker - 4 wheels, Cane - single point   Prior Device Use: Indicate devices/aids used by the patient prior to current illness, exacerbation or injury? None of the above   Current Functional Level Cognition   Arousal/Alertness: Awake/alert Overall Cognitive Status: Within Functional Limits for tasks assessed Orientation Level: Oriented X4 General Comments: wife states he is still a bit sleepy/groggy after having had procedure (TEE) earlier today Memory: Appears  intact Problem Solving:  (NT) Executive Function: Sequencing, Reasoning, Armed forces logistics/support/administrative officer (wfl) Reasoning: Appears intact Sequencing: Appears intact Organizing: Appears intact    Extremity Assessment (includes Sensation/Coordination)   Upper Extremity Assessment: RUE deficits/detail, LUE deficits/detail RUE Deficits / Details: shld flexion to 1/2 range, grip 4/5. Presence of dysdiadochokinesia RUE Sensation: decreased proprioception RUE Coordination: decreased fine motor, decreased gross motor LUE Deficits / Details: WFL, grip 5/5  Lower Extremity Assessment: Defer to PT evaluation, RLE deficits/detail RLE Deficits / Details: grossly weak with fxl assessment     ADLs   Overall ADL's : Needs assistance/impaired General ADL Comments: MAX A with LB ADLs in sitting, MIN/MOD A with UB ADLs in sitting. MOD +2 for SPS arm in arm t/f's     Mobility   Overal bed mobility: Needs Assistance Bed Mobility: Supine to Sit Rolling: Mod assist Sidelying to sit: Max assist, HOB elevated Supine to sit: Mod assist, +2 for physical assistance Sit to supine: +2 for physical assistance, Max assist General bed mobility comments: Max A +2 required for pt to move from sitting EOB to supine     Transfers   Overall transfer level: Needs assistance Equipment used: None Transfers: Stand Pivot Transfers, Sit to/from Stand Sit to Stand: +2 physical assistance, Max assist Stand pivot transfers: Max assist, +2 physical assistance General transfer comment: Transfer from recliner to  bed, pivoting toward L side. Left hand on bed rail, Max A +2 with knees blocked to facilitate stand and several stand attempts required to fully position bottom on EOB     Ambulation / Gait / Stairs / Wheelchair Mobility   Ambulation/Gait General Gait Details: not attempted     Posture / Balance Dynamic Sitting Balance Sitting balance - Comments: sitting unsupported in chair, pt has several episodes of left lateral leaning, requiring  MinA for righting Balance Overall balance assessment: Needs assistance Sitting-balance support: Feet supported, Single extremity supported Sitting balance-Leahy Scale: Fair Sitting balance - Comments: sitting unsupported in chair, pt has several episodes of left lateral leaning, requiring MinA for righting Postural control: Posterior lean, Left lateral lean Standing balance support: Single extremity supported, During functional activity Standing balance-Leahy Scale: Poor Standing balance comment: R knee blocking for standing     Special needs/care consideration Diabetic management yes and Designated visitor wife, [place] (from acute therapy documentation) Living Arrangements: Spouse/significant other  Lives With: Spouse Available Help at Discharge: Family Type of Home: House Home Layout: One level Home Access: Stairs to enter Entrance Stairs-Rails: Right Entrance Stairs-Number of Steps: [place]: No Additional Comments: reports he has not been driving since cataract surgery in July.   Discharge Living Setting Plans for Discharge Living Setting: Patient's home Type of Home at Discharge: House Discharge Home Layout: One level Discharge Home Access: Stairs to enter Entrance Stairs-Rails: Right Entrance Stairs-Number of Steps: 3 Discharge Bathroom Shower/Tub: Tub/shower unit, Walk-in shower (no room for shower seat in walk in shower?) Discharge Bathroom Toilet: Standard Discharge Bathroom Accessibility: No Does the patient have any problems obtaining your medications?: No   Social/Family/Support Systems Patient Roles: Spouse Anticipated Caregiver: Jeramie Scogin Anticipated Ambulance person Information: [phone] (cell) Ability/Limitations of Caregiver: supervision/min assist Caregiver Availability: 24/7 Discharge Plan Discussed with Primary Caregiver: Yes Is Caregiver In Agreement with Plan?: Yes Does Caregiver/Family have  Issues with Lodging/Transportation while Pt is in Rehab?: No     Goals Patient/Family Goal for Rehab: PT/OT/SLP Min A Expected length of stay: 14-18 days Pt/Family Agrees to Admission and willing to participate: Yes Program Orientation Provided & Reviewed with Pt/Caregiver Including Roles  & Responsibilities: Yes     Decrease burden of Care through IP rehab admission: n/a     Possible need for SNF placement upon discharge:not anticipated     Patient Condition: I have reviewed medical records from [facility], spoken with CM, and patient and spouse. I discussed via phone for inpatient rehabilitation assessment.  Patient will benefit from ongoing PT, OT and SLP, can actively participate in 3 hours of therapy a day 5 days of the week, and can make measurable gains during the admission.  Patient will also benefit from the coordinated team approach during an Inpatient Acute Rehabilitation admission.  The patient will receive intensive therapy as well as Rehabilitation physician, nursing, social worker, and care management interventions.  Due to bladder management, bowel management, safety, disease management, medication administration, pain management and patient education the patient requires 24 hour a day rehabilitation nursing.  The patient is currently mod +2 with mobility and basic ADLs.  Discharge setting and therapy post discharge at  home  is anticipated.  Patient has agreed to participate in the Acute Inpatient Rehabilitation Program and will admit today.   Preadmission Screen Completed By:  Michel Santee, PT, DPT 06/14/2020 10:42 AM ______________________________________________________________________   Discussed status with Dr. Posey Pronto  on 06/14/20 at 11:15 AM  and received approval for admission today.   Admission Coordinator:  Michel Santee, PT, DPT time 11:15 AM Sudie Grumbling 06/14/20

## 2020-06-14 NOTE — Progress Notes (Signed)
Inpatient Rehab Admissions Coordinator:   I have a bed available for pt to admit to CIR today. Dr. Louanne Belton in agreement.  I will let pt/family know and I will call [facility] to arrange pickup around [place], PT, DPT Admissions Coordinator [phone] 06/14/20  10:28 AM

## 2020-06-15 ENCOUNTER — Telehealth: Payer: Self-pay

## 2020-06-15 ENCOUNTER — Inpatient Hospital Stay (HOSPITAL_COMMUNITY): Payer: Medicare Other | Admitting: Physical Therapy

## 2020-06-15 ENCOUNTER — Inpatient Hospital Stay (HOSPITAL_COMMUNITY): Payer: Medicare Other | Admitting: Occupational Therapy

## 2020-06-15 ENCOUNTER — Inpatient Hospital Stay (HOSPITAL_COMMUNITY): Payer: Medicare Other | Admitting: Speech Pathology

## 2020-06-15 DIAGNOSIS — I6350 Cerebral infarction due to unspecified occlusion or stenosis of unspecified cerebral artery: Secondary | ICD-10-CM

## 2020-06-15 LAB — CBC WITH DIFFERENTIAL/PLATELET
Abs Immature Granulocytes: 0.03 10*3/uL (ref 0.00–0.07)
Basophils Absolute: 0.1 10*3/uL (ref 0.0–0.1)
Basophils Relative: 1 %
Eosinophils Absolute: 0.3 10*3/uL (ref 0.0–0.5)
Eosinophils Relative: 4 %
HCT: 38.4 % — ABNORMAL LOW (ref 39.0–52.0)
Hemoglobin: 12.3 g/dL — ABNORMAL LOW (ref 13.0–17.0)
Immature Granulocytes: 0 %
Lymphocytes Relative: 23 %
Lymphs Abs: 1.6 10*3/uL (ref 0.7–4.0)
MCH: 28.7 pg (ref 26.0–34.0)
MCHC: 32 g/dL (ref 30.0–36.0)
MCV: 89.7 fL (ref 80.0–100.0)
Monocytes Absolute: 0.6 10*3/uL (ref 0.1–1.0)
Monocytes Relative: 9 %
Neutro Abs: 4.6 10*3/uL (ref 1.7–7.7)
Neutrophils Relative %: 63 %
Platelets: 229 10*3/uL (ref 150–400)
RBC: 4.28 MIL/uL (ref 4.22–5.81)
RDW: 14.5 % (ref 11.5–15.5)
WBC: 7.2 10*3/uL (ref 4.0–10.5)
nRBC: 0 % (ref 0.0–0.2)

## 2020-06-15 LAB — COMPREHENSIVE METABOLIC PANEL
ALT: 28 U/L (ref 0–44)
AST: 32 U/L (ref 15–41)
Albumin: 3.4 g/dL — ABNORMAL LOW (ref 3.5–5.0)
Alkaline Phosphatase: 60 U/L (ref 38–126)
Anion gap: 11 (ref 5–15)
BUN: 18 mg/dL (ref 8–23)
CO2: 26 mmol/L (ref 22–32)
Calcium: 8.9 mg/dL (ref 8.9–10.3)
Chloride: 104 mmol/L (ref 98–111)
Creatinine, Ser: 0.93 mg/dL (ref 0.61–1.24)
GFR calc Af Amer: >60 mL/min (ref >60)
GFR calc non Af Amer: >60 mL/min (ref >60)
Glucose, Bld: 176 mg/dL — ABNORMAL HIGH (ref 70–99)
Potassium: 3.6 mmol/L (ref 3.5–5.1)
Sodium: 141 mmol/L (ref 135–145)
Total Bilirubin: 1.2 mg/dL (ref 0.3–1.2)
Total Protein: 6.3 g/dL — ABNORMAL LOW (ref 6.5–8.1)

## 2020-06-15 LAB — GLUCOSE, CAPILLARY
Glucose-Capillary: 140 mg/dL — ABNORMAL HIGH (ref 70–99)
Glucose-Capillary: 159 mg/dL — ABNORMAL HIGH (ref 70–99)
Glucose-Capillary: 164 mg/dL — ABNORMAL HIGH (ref 70–99)
Glucose-Capillary: 166 mg/dL — ABNORMAL HIGH (ref 70–99)
Glucose-Capillary: 183 mg/dL — ABNORMAL HIGH (ref 70–99)
Glucose-Capillary: 200 mg/dL — ABNORMAL HIGH (ref 70–99)

## 2020-06-15 NOTE — Evaluation (Signed)
Speech Language Pathology Assessment and Plan  Patient Details  Name: Peter Ryan MRN: [id number] Date of Birth: 1951/05/16  SLP Diagnosis: Dysarthria;Cognitive Impairments;Dysphagia  Rehab Potential: Good ELOS: 3 weeks    Today's Date: 06/15/2020 SLP Individual Time: [id number] SLP Individual Time Calculation (min): 72 min   Hospital Problem: Principal Problem:   Left pontine cerebrovascular accident [facility])  Past Medical History:  Past Medical History:  Diagnosis Date  . Arthritis   . BPH (benign prostatic hyperplasia)   . Cancer ([place])    Non-Hodgkins lymphoma  . Chronic prostatitis   . Cirrhosis of liver ([place])   . Diabetes mellitus without complication ([place])   . Dyslipidemia   . Elevated PSA   . Gastric reflux   . Gout   . HTN (hypertension)   . Neuropathy   . Over weight   . Psoriasis   . Testicular pain, right   . Urinary frequency    Past Surgical History:  Past Surgical History:  Procedure Laterality Date  . hydrocelectomy    . IR FLUORO GUIDE PORT INSERTION RIGHT  04/11/2017  . IR REMOVAL TUN ACCESS W/ PORT W/O FL MOD SED  05/29/2018  . PILONIDAL CYST EXCISION    . TEE WITHOUT CARDIOVERSION N/A 06/09/2020   Procedure: TRANSESOPHAGEAL ECHOCARDIOGRAM (TEE);  Surgeon: Minna Merritts, MD;  Location: [facility] ORS;  Service: Cardiovascular;  Laterality: N/A;    Assessment / Plan / Recommendation Clinical Impression   HPI: Peter Ryan is a 69 year old right-handed male with history of obesity BMI 40.89, BPH, quit smoking 46 years ago, hypertension, diabetes mellitus, history of rectal bleeding, hyperlipidemia and non-Hodgkin's lymphoma status post chemotherapy currently in remission and under surveillance followed by Dr Randa Evens.  History taken from chart review and patient.  Patient lives with spouse.  1 level home 4 steps to entry.  Independent assistive device community ambulation.  He presented on 06/05/2020 with right hemiparesis and dysarthria.  Cranial CT  unremarkable for acute intercranial process.  Patient did not receive TPA.  CT angiogram of head and neck with no large vessel occlusion.  MRI of the brain showed moderate sized left pontine infarct, no associated hemorrhage or mass-effect.  Stable to perhaps slightly faded acute white matter lacunar infarct adjacent to the right frontal lobe.  Admission chemistries potassium 3.4, glucose 252, SARS coronavirus negative, hemoglobin 11.9.  Echocardiogram with ejection fraction of 56%, grade 1 diastolic dysfunction.TEE with no acute findings and did not reveal any PFO.  Awaiting plan for loop recorder which will be discussed as outpatient with cardiology service Dr. Rockey Situ.  Currently maintained on aspirin for CVA prophylaxis.  No DAPT at this time due to history of rectal outlet bleeding.  Tolerating regular consistency diet.  Therapy evaluations completed and patient was admitted for a comprehensive rehab program 06/14/20 and SLP evaluations were completed 06/15/20 with results as follows:  Pt presents with s/sx of mild oral dysphagia s/p right facial weakness after acute CVA. However, he is using strategies for oral clearance of regular textures with only Supervision and although his mastication is slightly prolonged, it is functional and pt is able to select items that are easier to chew from regular menu, therefore further restricting diet is not necessary. Pt reports some coughing with thins, however no overt s/sx aspiration noted across single or serial sips via cup or straw. Recommend continue current regular texture diet with thin liquids, meds whole with thins or in applesauce depending on pt preference. ST will follow up briefly to  ensure diet safety and efficiency and work toward Mod I use of swallow strategies.   Pt also present with very mild dysarthria marked by decreased intelligibility primarily with consonant clusters due to articulatory imprecision. Although pt is 85-90% intelligible in  conversation, pt reports he would like to learn compensatory strategies and would benefit from education regarding dysarthria. His expressive and receptive language was [facility] for all tasks assessed. Pt also demonstrated slightly slowed cognitive processing (which he endorses), moderate short term memory and mild complex problem solving and anticipatory awareness deficits, from a cognitive standpoint, per his performance during functional tasks and on the Cogn[facility]at cognitive evaluation.   Recommend pt received skilled ST services for dysarthria, cognitive impairments, and dysphagia while inpatient in order to maximize his safety and functional independence prior to discharge home.    Skilled Therapeutic Interventions          Bedside swallow and cognitive-linguistic evaluations were administered and results were reviewed with pt (please see above for details regarding results).  SLP Assessment  Patient will need skilled Speech Lanaguage Pathology Services during CIR admission    Recommendations  Medication Administration: Whole meds with puree Supervision: Patient able to self feed (needs set up assist) Compensations: Minimize environmental distractions;Slow rate;Small sips/bites;Lingual sweep for clearance of pocketing Postural Changes and/or Swallow Maneuvers: Seated upright 90 degrees;Upright 30-60 min after meal Oral Care Recommendations: Oral care BID Recommendations for Other Services: Neuropsych consult Patient destination: Home Follow up Recommendations: 24 hour supervision/assistance;Outpatient SLP Equipment Recommended: None recommended by SLP    SLP Frequency 3 to 5 out of 7 days   SLP Duration  SLP Intensity  SLP Treatment/Interventions 3 weeks  Minumum of 1-2 x/day, 30 to 90 minutes  Cognitive remediation/compensation;Cueing hierarchy;Dysphagia/aspiration precaution training;Functional tasks;Patient/family education;Internal/external aids;Speech/Language facilitation     Pain Pain Assessment Pain Scale: 0-10 Pain Score: 0-No pain  Prior Functioning Type of Home: House  Lives With: Spouse Available Help at Discharge: Family;Available 24 hours/day  SLP Evaluation Cognition Overall Cognitive Status: Impaired/Different from baseline Arousal/Alertness: Awake/alert Orientation Level: Oriented X4 Attention: Sustained;Selective Sustained Attention: Appears intact Selective Attention: Appears intact Memory: Impaired Memory Impairment: Retrieval deficit;Decreased short term memory Decreased Short Term Memory: Verbal basic Immediate Memory Recall: Sock;Blue;Bed Memory Recall Sock: Not able to recall Memory Recall Blue: With Cue Memory Recall Bed: With Cue Awareness: Impaired Awareness Impairment: Anticipatory impairment Problem Solving: Impaired Problem Solving Impairment: Functional complex Executive Function:  (WFL for tasks assessed today) Reasoning: Appears intact Sequencing: Appears intact Organizing: Appears intact Safety/Judgment: Appears intact  Comprehension Auditory Comprehension Overall Auditory Comprehension: Appears within functional limits for tasks assessed Conversation: Complex Interfering Components: Processing speed Visual Recognition/Discrimination Discrimination: Within Function Limits Reading Comprehension Reading Status: Within funtional limits Expression Expression Primary Mode of Expression: Verbal Verbal Expression Overall Verbal Expression: Appears within functional limits for tasks assessed Initiation: No impairment Level of Generative/Spontaneous Verbalization: Conversation [employer] of Communication: Not applicable Written Expression Dominant Hand: Right Written Expression: Not tested Oral Motor Oral Motor/Sensory Function Overall Oral Motor/Sensory Function: Mild impairment Facial ROM: Within Functional Limits Facial Symmetry: Abnormal symmetry right Facial Strength: Reduced right Lingual ROM:  Within Functional Limits Lingual Symmetry: Within Functional Limits Lingual Strength: Reduced Velum: Within Functional Limits Mandible: Within Functional Limits Motor Speech Overall Motor Speech: Impaired Respiration: Within functional limits Phonation: Normal Resonance: Within functional limits Articulation: Impaired Level of Impairment: Conversation Intelligibility: Intelligibility reduced Word: 75-100% accurate Phrase: 75-100% accurate Sentence: 75-100% accurate Conversation: 75-100% accurate Motor Planning: Witnin functional limits Effective Techniques: Over-articulate;Increased vocal  intensity  Care Tool Care Tool Cognition Expression of Ideas and Wants Expression of Ideas and Wants: Some difficulty - exhibits some difficulty with expressing needs and ideas (e.g, some words or finishing thoughts) or speech is not clear   Understanding Verbal and Non-Verbal Content Understanding Verbal and Non-Verbal Content: Understands (complex and basic) - clear comprehension without cues or repetitions   Memory/Recall Ability *first 3 days only Memory/Recall Ability *first 3 days only: Current season;Location of own room;Staff names and faces;That he or she is in a hospital/hospital unit      Intelligibility: Intelligibility reduced Word: 75-100% accurate Phrase: 75-100% accurate Sentence: 75-100% accurate Conversation: 75-100% accurate  Bedside Swallowing Assessment General Date of Onset: 06/05/20 Previous Swallow Assessment: BSE 06/07/20 Diet Prior to this Study: Regular;Thin liquids Temperature Spikes Noted: No Respiratory Status: Room air History of Recent Intubation: No Behavior/Cognition: Alert;Cooperative;Pleasant mood Oral Cavity - Dentition: Adequate natural dentition Self-Feeding Abilities: Able to feed self;Needs set up Vision: Functional for self-feeding Patient Positioning: Upright in bed Baseline Vocal Quality: Normal Volitional Cough: Strong Volitional Swallow:  Able to elicit  Oral Care Assessment Does patient have any of the following "high(er) risk" factors?: None of the above Does patient have any of the following "at risk" factors?: Other - dysphagia Patient is LOW RISK: Follow universal precautions (see row information) Ice Chips Ice chips: Not tested Thin Liquid Thin Liquid: Within functional limits Presentation: Cup;Straw;Self Fed Oral Phase Impairments: Reduced labial seal Nectar Thick Nectar Thick Liquid: Not tested Honey Thick Honey Thick Liquid: Not tested Puree Puree: Not tested Solid Solid: Impaired Presentation: Self Fed Oral Phase Functional Implications: Impaired mastication BSE Assessment Suspected Esophageal Findings Suspected Esophageal Findings: Belching Risk for Aspiration Other Related Risk Factors: History of GERD  Short Term Goals: Week 1: SLP Short Term Goal 1 (Week 1): Pt will consume regular solid textures and thin liquids with minimal overt s/sx aspiration and will use compensatory swallow strategies to clear right buccal pocketing Mod I. SLP Short Term Goal 2 (Week 1): Pt will use 90% intelligible speech in conversation with Supervision A verbal cues for use of compensatory strategies. SLP Short Term Goal 3 (Week 1): Pt will recall 2 speech intelligibility strategies Mod A verbal cues for recall/use of aids or strategies. SLP Short Term Goal 4 (Week 1): Pt will recall new/daily information with Mod A verbal/visual cues for use of aids/strategies. SLP Short Term Goal 5 (Week 1): Pt will demonstrate ability to problem solve mildly complex to complex tasks with Min A verbal/visual cues. SLP Short Term Goal 6 (Week 1): Pt will anticipate 2 ADLs he will need assistance with at discharge with Min A verbal/visual cues.  Refer to Care Plan for Long Term Goals  Recommendations for other services: Neuropsych  Discharge Criteria: Patient will be discharged from SLP if patient refuses treatment 3 consecutive times  without medical reason, if treatment goals not met, if there is a change in medical status, if patient makes no progress towards goals or if patient is discharged from hospital.  The above assessment, treatment plan, treatment alternatives and goals were discussed and mutually agreed upon: by patient  Arbutus Leas 06/15/2020, 12:39 PM

## 2020-06-15 NOTE — Telephone Encounter (Signed)
Transition Care Management Follow-up Telephone Call  Date of discharge and from where: [facility] 06/14/2020  How have you been since you were released from the hospital? [facility], still trouble with right side of mouth  Any questions or concerns? No  Items Reviewed:  Did the pt receive and understand the discharge instructions provided? Yes   Medications obtained and verified? Yes   Any new allergies since your discharge? No   Dietary orders reviewed? Yes  Do you have support at home? Yes has discharged to rehab  Functional Questionnaire: (I = Independent and D = Dependent) ADLs: may need some assistance will be working on that at rehab  Bathing/Dressing- may need some assistance will be working on that at rehab  Meal Prep- D  Eating- I  Maintaining continence- I  Transferring/Ambulation- I  Managing Meds- D  Follow up appointments reviewed:   PCP Hospital f/u appt confirmed? States does not need appointment with MD yet since he is in rehab.  Are transportation arrangements needed? Yes   If their condition worsens, is the pt aware to call PCP or go to the Emergency Dept.? Yes  Was the patient provided with contact information for the PCP's office or ED? Yes  Was to pt encouraged to call back with questions or concerns? Yes

## 2020-06-15 NOTE — Evaluation (Signed)
Physical Therapy Assessment and Plan  Patient Details  Name: Peter Ryan MRN: [id number] Date of Birth: 10-30-1950  PT Diagnosis: Abnormal posture, Abnormality of gait, Cognitive deficits, Difficulty walking, Hemiparesis dominant, Hypertonia, Impaired sensation and Muscle weakness Rehab Potential: Good ELOS: ~3.5 weeks   Today's Date: 06/15/2020 PT Individual Time: 1305-1410 PT Individual Time Calculation (min): 65 min    Hospital Problem: Principal Problem:   Left pontine cerebrovascular accident [facility])   Past Medical History:  Past Medical History:  Diagnosis Date  . Arthritis   . BPH (benign prostatic hyperplasia)   . Cancer ([place])    Non-Hodgkins lymphoma  . Chronic prostatitis   . Cirrhosis of liver ([place])   . Diabetes mellitus without complication ([place])   . Dyslipidemia   . Elevated PSA   . Gastric reflux   . Gout   . HTN (hypertension)   . Neuropathy   . Over weight   . Psoriasis   . Testicular pain, right   . Urinary frequency    Past Surgical History:  Past Surgical History:  Procedure Laterality Date  . hydrocelectomy    . IR FLUORO GUIDE PORT INSERTION RIGHT  04/11/2017  . IR REMOVAL TUN ACCESS W/ PORT W/O FL MOD SED  05/29/2018  . PILONIDAL CYST EXCISION    . TEE WITHOUT CARDIOVERSION N/A 06/09/2020   Procedure: TRANSESOPHAGEAL ECHOCARDIOGRAM (TEE);  Surgeon: Minna Merritts, MD;  Location: [facility] ORS;  Service: Cardiovascular;  Laterality: N/A;    Assessment & Plan Clinical Impression: Patient is a 69 y.o. year old right-handed male with history of obesity BMI 40.89, BPH, quit smoking 46 years ago, hypertension, diabetes mellitus, history of rectal bleeding, hyperlipidemia and non-Hodgkin's lymphoma status post chemotherapy currently in remission and under surveillance followed by Dr Randa Evens.  History taken from chart review and patient.  Patient lives with spouse.  1 level home 4 steps to entry.  Independent assistive device community ambulation.  He  presented on 06/05/2020 with right hemiparesis and dysarthria.  Cranial CT unremarkable for acute intercranial process.  Patient did not receive TPA.  CT angiogram of head and neck with no large vessel occlusion.  MRI of the brain showed moderate sized left pontine infarct, no associated hemorrhage or mass-effect.  Stable to perhaps slightly faded acute white matter lacunar infarct adjacent to the right frontal lobe.  Admission chemistries potassium 3.4, glucose 252, SARS coronavirus negative, hemoglobin 11.9.  Echocardiogram with ejection fraction of 17%, grade 1 diastolic dysfunction.TEE with no acute findings and did not reveal any PFO.  Awaiting plan for loop recorder which will be discussed as outpatient with cardiology service Dr. Rockey Situ.  Currently maintained on aspirin for CVA prophylaxis.  No DAPT at this time due to history of rectal outlet bleeding.  Tolerating regular consistency diet.  Therapy evaluations completed and patient was admitted for a comprehensive rehab program.  Patient transferred to CIR on 06/14/2020 .   Patient currently requires +2 total assist with mobility secondary to muscle weakness, muscle joint tightness and muscle paralysis, decreased cardiorespiratoy endurance, impaired timing and sequencing, abnormal tone and unbalanced muscle activation, decreased awareness, decreased problem solving and delayed processing and decreased sitting balance, decreased standing balance, decreased postural control, hemiplegia and decreased balance strategies.  Prior to hospitalization, patient was independent  with mobility and lived with Spouse in a House home.  Home access is 3Stairs to enter.  Patient will benefit from skilled PT intervention to maximize safe functional mobility, minimize fall risk and decrease caregiver burden  for planned discharge home with 24 hour assist.  Anticipate patient will benefit from follow up HH at discharge.  PT - End of Session Activity Tolerance: Tolerates 30+  min activity with multiple rests Endurance Deficit: Yes Endurance Deficit Description: requires frequent seated rest breaks PT Assessment Rehab Potential (ACUTE/IP ONLY): Good PT Barriers to Discharge: Inaccessible home environment;Home environment access/layout;Neurogenic Bowel & Bladder PT Patient demonstrates impairments in the following area(s): Balance;Pain;Behavior;Perception;Edema;Safety;Endurance;Sensory;Motor;Skin Integrity;Nutrition PT Transfers Functional Problem(s): Bed Mobility;Bed to Chair;Car;Furniture PT Locomotion Functional Problem(s): Ambulation;Wheelchair Mobility;Stairs PT Plan PT Intensity: Minimum of 1-2 x/day ,45 to 90 minutes PT Frequency: 5 out of 7 days PT Duration Estimated Length of Stay: ~3.5 weeks PT Treatment/Interventions: Ambulation/gait training;Cognitive remediation/compensation;Discharge planning;DME/adaptive equipment instruction;Functional mobility training;Pain management;Psychosocial support;Splinting/orthotics;Therapeutic Activities;UE/LE Strength taining/ROM;Visual/perceptual remediation/compensation;Balance/vestibular training;Community reintegration;Disease management/prevention;Functional electrical stimulation;Neuromuscular re-education;Patient/family education;Skin care/wound management;Stair training;Therapeutic Exercise;UE/LE Coordination activities;Wheelchair propulsion/positioning PT Transfers Anticipated Outcome(s): min assist PT Locomotion Anticipated Outcome(s): min assist PT Recommendation Recommendations for Other Services: Neuropsych consult Follow Up Recommendations: Home health PT;24 hour supervision/assistance Patient destination: Home Equipment Recommended: To be determined   PT Evaluation Precautions/Restrictions Precautions Precautions: Fall;Other (comment) Precaution Comments: R hemiparesis Restrictions Other Position/Activity Restrictions: protect weak RUE Pain Pain Assessment Pain Scale: 0-10 Pain Score: 0-No  pain Home Living/Prior Functioning Home Living Living Arrangements: Spouse/significant other Available Help at Discharge: Family;Available 24 hours/day Type of Home: House Home Access: Stairs to enter [employer] of Steps: 3 Entrance Stairs-Rails: Right Home Layout: One level Bathroom Shower/Tub: Tub/shower unit (Have walk-in shower but is small. Cannot fit shower chair inside.) Bathroom Toilet:  (comfort height) Additional Comments: Wife unsure at time of evaluation.  Lives With: Spouse [name]) Prior Function Level of Independence: Independent with gait;Independent with transfers  Able to Take Stairs?: Yes (reports heavily relying on rail) Driving: Yes Comments: reports he sleeps in a recliner, pt's wife reports his walking was "slow and deliberate" and limited distances due to B LE weakness (pt's wife reports he could have benefited from use of a cane) Perception  Perception Perception: Within Functional Limits Praxis Praxis: Intact  Cognition  Overall Cognitive Status: Impaired/Different from baseline Arousal/Alertness: Awake/alert Orientation Level: Oriented X4 Attention: Focused;Sustained Focused Attention: Appears intact Sustained Attention: Appears intact Selective Attention: Appears intact Awareness: Impaired Problem Solving: Impaired Safety/Judgment: Appears intact Sensation Sensation Light Touch: Impaired Detail Peripheral sensation comments: reports hx of peripheral neuropathy with stocking pattern impaired/absent Light Touch Impaired Details: Impaired RLE;Impaired LLE Hot/Cold: Not tested Proprioception: Impaired Detail Proprioception Impaired Details: Impaired RLE Stereognosis: Not tested Coordination Gross Motor Movements are Fluid and Coordinated: No Coordination and Movement Description: impaired due to R hemiparesis and impaired trunk control Motor  Motor Motor: Hemiplegia;Abnormal postural alignment and control;Abnormal tone Motor -  Skilled Clinical Observations: R hemiparesis (UE>LE)   Trunk/Postural Assessment  Cervical Assessment Cervical Assessment: Exceptions to [facility] (forward head) Thoracic Assessment Thoracic Assessment: Exceptions to [facility] (thoracic rounding) Lumbar Assessment Lumbar Assessment: Exceptions to [facility] (posterior pelvic tilt in sitting) Postural Control Postural Control: Deficits on evaluation Trunk Control: impaired requiring UE support and assist to maintain upright Righting Reactions: delayed and inadequate Postural Limitations: decreased  Balance Balance Balance Assessed: Yes Static Sitting Balance Static Sitting - Balance Support: Left upper extremity supported;Feet supported Static Sitting - Level of Assistance: 4: Min assist;3: Mod assist Dynamic Sitting Balance Dynamic Sitting - Balance Support: Left upper extremity supported;Feet supported Dynamic Sitting - Level of Assistance: 2: Max assist;3: Mod assist Static Standing Balance Static Standing - Balance Support: Bilateral upper extremity supported Static Standing - Level  of Assistance: Other (comment) (in stedy with mod assist) Dynamic Standing Balance Dynamic Standing - Balance Support: Bilateral upper extremity supported Dynamic Standing - Level of Assistance: Other (comment) (in stedy with max assist) Extremity Assessment  RLE Assessment RLE Assessment: Exceptions to [facility] Passive Range of Motion (PROM) Comments: WFL RLE Strength Right Hip Flexion: 2+/5 Right Knee Flexion: 2-/5 Right Knee Extension: 2/5 Right Ankle Dorsiflexion: 1/5 Right Ankle Plantar Flexion: 2-/5 RLE Tone RLE Tone: Mild;Hypertonic Hypertonic Details: extensor pattern noticed during rolling LLE Assessment LLE Assessment: Exceptions to [facility] Active Range of Motion (AROM) Comments: WFL LLE Strength Left Hip Flexion: 4-/5 Left Knee Flexion: 4/5 Left Knee Extension: 4+/5 Left Ankle Dorsiflexion: 4+/5 Left Ankle Plantar Flexion: 4+/5  Care Tool Care Tool  Bed Mobility Roll left and right activity   Roll left and right assist level: Total Assistance - Patient < 25%    Sit to lying activity   Sit to lying assist level: Total Assistance - Patient < 25%    Lying to sitting edge of bed activity   Lying to sitting edge of bed assist level: Total Assistance - Patient < 25%     Care Tool Transfers Sit to stand transfer   Sit to stand assist level: Dependent - Patient 0% (stedy)    Chair/bed transfer   Chair/bed transfer assist level: Dependent - mechanical lift (stedy)     Toilet transfer Toilet transfer activity did not occur: Safety/medical concerns      Scientist, product/process development transfer activity did not occur: Safety/medical concerns        Care Tool Locomotion Ambulation Ambulation activity did not occur: Safety/medical concerns        Walk 10 feet activity Walk 10 feet activity did not occur: Safety/medical concerns       Walk 50 feet with 2 turns activity Walk 50 feet with 2 turns activity did not occur: Safety/medical concerns      Walk 150 feet activity Walk 150 feet activity did not occur: Safety/medical concerns      Walk 10 feet on uneven surfaces activity Walk 10 feet on uneven surfaces activity did not occur: Safety/medical concerns      Stairs Stair activity did not occur: Safety/medical concerns        Walk up/down 1 step activity Walk up/down 1 step or curb (drop down) activity did not occur: Safety/medical concerns     Walk up/down 4 steps activity did not occuR: Safety/medical concerns  Walk up/down 4 steps activity      Walk up/down 12 steps activity Walk up/down 12 steps activity did not occur: Safety/medical concerns      Pick up small objects from floor Pick up small object from the floor (from standing position) activity did not occur: Safety/medical concerns      Wheelchair Will patient use wheelchair at discharge?:  (TBD)          Wheel 50 feet with 2 turns activity      Wheel 150 feet  activity        Refer to Care Plan for Long Term Goals  SHORT TERM GOAL WEEK 1 PT Short Term Goal 1 (Week 1): Pt will perform supine<>sit with max assist of 1 PT Short Term Goal 2 (Week 1): Pt will perform sit<>stands with +2 max assist using LRAD (not lift equipment) PT Short Term Goal 3 (Week 1): Pt will perform bed<>chair transfers with +2 max assist without use of lift equipment PT Short Term Goal 4 (Week 1):  Pt will initiate gait training  Recommendations for other services: Neuropsych  Skilled Therapeutic Intervention Evaluation completed (see details above) with education on PT POC and goals and individual treatment initiated with focus on bed mobility, sit<>stand transfers, activity tolerance, trunk control/midline orientation, and OOB activity tolerance. Pt received supine in bed with his wife, [name], present and pt agreeable to therapy session. Pt performed the below mobility tasks with assistance levels as described with therapist providing cuing for sequencing/technique and manual facilitation for increased independent. Pt requires assist for sitting balance EOB due to impaired trunk control. Unable to safely attempt transfers at this time without use of stedy due to significant trunk control impairments and R hemiparesis.Therapist retrieved patient a TIS w/c with cushion for improved pressure relief and to increase upright, OOB activity tolerance. Pt very motivated to continue working with therapy and educated on daily therapy schedule, weekly team meetings, purpose of PT evaluation, and other CIR information. At end of session pt left seated tilted back in TIS w/c with wife/pt educated on pressure relief schedule to notify nursing staff.  Mobility Bed Mobility Bed Mobility: Rolling Right;Rolling Left;Sit to Supine;Supine to Sit Rolling Right: Maximal Assistance - Patient 25-49% Rolling Left: Total Assistance - Patient < 25%;Maximal Assistance - Patient 25-49% Supine to Sit: Total  Assistance - Patient < 25% Sit to Supine: Total Assistance - Patient < 25% Transfers Transfers: Sit to Stand;Stand to Sit;Transfer via [place] to Stand: Moderate Assistance - Patient 50-74%;Maximal Assistance - Patient 25-49% Stand to Sit: Moderate Assistance - Patient 50-74%;Maximal Assistance - Patient 25-49% Transfer (Assistive device): Other (Comment) (stedy) Transfer via Lift Equipment: Animal nutritionist: No Gait Gait: No Stairs / Additional Locomotion Stairs: No Product manager Mobility: No   Discharge Criteria: Patient will be discharged from PT if patient refuses treatment 3 consecutive times without medical reason, if treatment goals not met, if there is a change in medical status, if patient makes no progress towards goals or if patient is discharged from hospital.  The above assessment, treatment plan, treatment alternatives and goals were discussed and mutually agreed upon: by patient and by family  Tawana Scale , PT, DPT, CSRS  06/15/2020, 12:23 PM

## 2020-06-15 NOTE — Progress Notes (Signed)
Inpatient [facility] Individual Statement of Services  Patient Name:  Peter Ryan  Date:  06/15/2020  Welcome to the [facility].  Our goal is to provide you with an individualized program based on your diagnosis and situation, designed to meet your specific needs.  With this comprehensive rehabilitation program, you will be expected to participate in at least 3 hours of rehabilitation therapies Monday-Friday, with modified therapy programming on the weekends.  Your rehabilitation program will include the following services:  Physical Therapy (PT), Occupational Therapy (OT), Speech Therapy (ST), 24 hour per day rehabilitation nursing, Therapeutic Recreaction (TR), Neuropsychology, Care Coordinator, Rehabilitation Medicine, Nutrition Services, Pharmacy Services and Other  Weekly team conferences will be held on Wednesdays to discuss your progress.  Your Inpatient Rehabilitation Care Coordinator will talk with you frequently to get your input and to update you on team discussions.  Team conferences with you and your family in attendance may also be held.  Expected length of stay: 14-18 Days  Overall anticipated outcome: Sup to Min A  Depending on your progress and recovery, your program may change. Your Inpatient Rehabilitation Care Coordinator will coordinate services and will keep you informed of any changes. Your Inpatient Rehabilitation Care Coordinator's name and contact numbers are listed  below.  The following services may also be recommended but are not provided by the [facility]:    [place] will be made to provide these services after discharge if needed.  Arrangements include referral to agencies that provide these services.  Your insurance has been verified to be:  Medicare Your primary doctor is:  [name]., MD  Pertinent  information will be shared with your doctor and your insurance company.  Inpatient Rehabilitation Care Coordinator:  Erlene Quan, [place] or [phone]  Information discussed with and copy given to patient by: Dyanne Iha, 06/15/2020, 11:57 AM

## 2020-06-15 NOTE — Progress Notes (Signed)
Occupational Therapy Assessment and Plan  Patient Details  Name: Peter Ryan MRN: [id number] Date of Birth: 1951-04-12  OT Diagnosis: abnormal posture, flaccid hemiplegia and hemiparesis and muscle weakness (generalized) Rehab Potential: Rehab Potential (ACUTE ONLY): Good ELOS: 21-23 days   Today's Date: 06/15/2020 OT Individual Time: 9678-9381 OT Individual Time Calculation (min): 72 min     Hospital Problem: Principal Problem:   Left pontine cerebrovascular accident [facility])   Past Medical History:  Past Medical History:  Diagnosis Date  . Arthritis   . BPH (benign prostatic hyperplasia)   . Cancer ([place])    Non-Hodgkins lymphoma  . Chronic prostatitis   . Cirrhosis of liver ([place])   . Diabetes mellitus without complication ([place])   . Dyslipidemia   . Elevated PSA   . Gastric reflux   . Gout   . HTN (hypertension)   . Neuropathy   . Over weight   . Psoriasis   . Testicular pain, right   . Urinary frequency    Past Surgical History:  Past Surgical History:  Procedure Laterality Date  . hydrocelectomy    . IR FLUORO GUIDE PORT INSERTION RIGHT  04/11/2017  . IR REMOVAL TUN ACCESS W/ PORT W/O FL MOD SED  05/29/2018  . PILONIDAL CYST EXCISION    . TEE WITHOUT CARDIOVERSION N/A 06/09/2020   Procedure: TRANSESOPHAGEAL ECHOCARDIOGRAM (TEE);  Surgeon: Minna Merritts, MD;  Location: [facility] ORS;  Service: Cardiovascular;  Laterality: N/A;    Assessment & Plan Clinical Impression: 69 y.o. R handed male presented on 06/05/2020 with right hemiparesis and dysarthria.  Cranial CT unremarkable for acute intercranial process.  Patient did not receive TPA.  CT angiogram of head and neck with no large vessel occlusion.  MRI of the brain showed moderate sized left pontine infarct, no associated hemorrhage or mass-effect.  Stable to perhaps slightly faded acute white matter lacunar infarct adjacent to the right frontal lobe.  Admission chemistries potassium 3.4, glucose 252, SARS coronavirus  negative, hemoglobin 11.9.  Echocardiogram with ejection fraction of 01%, grade 1 diastolic dysfunction.TEE with no acute findings and did not reveal any PFO.  Awaiting plan for loop recorder which will be discussed as outpatient with cardiology service Dr. Rockey Situ.  Currently maintained on aspirin for CVA prophylaxis. Patient transferred to CIR on 06/14/2020 .    Patient currently requires max with basic self-care skills and +2 assist for bed mobility and functional tranfers secondary to muscle weakness and muscle paralysis, decreased cardiorespiratoy endurance, abnormal tone, decreased coordination, decreased sitting balance, decreased standing balance, decreased postural control, hemiplegia and decreased balance strategies. Prior to hospitalization, patient could complete BADLs with modified independence.  Patient will benefit from skilled intervention to decrease level of assist with basic self-care skills and increase independence with basic self-care skills prior to discharge home with care partner.  Anticipate patient will require 24 hour supervision and minimal physical assistance and follow up home health.  OT - End of Session Activity Tolerance: Tolerates 30+ min activity with multiple rests Endurance Deficit: Yes OT Assessment Rehab Potential (ACUTE ONLY): Good OT Barriers to Discharge: Inaccessible home environment OT Patient demonstrates impairments in the following area(s): Balance;Endurance;Motor;Pain OT Basic ADL's Functional Problem(s): Grooming;Bathing;Dressing;Toileting OT Transfers Functional Problem(s): Toilet OT Additional Impairment(s): Fuctional Use of Upper Extremity OT Plan OT Intensity: Minimum of 1-2 x/day, 45 to 90 minutes OT Frequency: 5 out of 7 days OT Duration/Estimated Length of Stay: 21-23 days OT Treatment/Interventions: Medical illustrator training;Community reintegration;Discharge planning;DME/adaptive equipment instruction;Functional electrical  stimulation;Functional mobility training;Neuromuscular re-education;Pain  management;Patient/family education;Self Care/advanced ADL retraining;Therapeutic Activities;Therapeutic Exercise;UE/LE Coordination activities;Wheelchair propulsion/positioning OT Basic Self-Care Anticipated Outcome(s): Supervision to [employer] A OT Toileting Anticipated Outcome(s): Min A OT Bathroom Transfers Anticipated Outcome(s): Min A OT Recommendation Patient destination: Home Follow Up Recommendations: Home health OT;24 hour supervision/assistance Equipment Recommended: To be determined   OT Evaluation Precautions/Restrictions  Precautions Precautions: Fall Restrictions Weight Bearing Restrictions: No Other Position/Activity Restrictions: protect weak RUE General Chart Reviewed: Yes Family/Caregiver Present: Yes [name]) Vital Signs  Pain Pain Assessment Pain Scale: 0-10 Pain Score: 0-No pain Home Living/Prior Functioning Home Living Family/patient expects to be discharged to:: Inpatient rehab Living Arrangements: Spouse/significant other Available Help at Discharge: Family, Available 24 hours/day Type of Home: House Home Access: Stairs to enter [employer] of Steps: 3-4 Entrance Stairs-Rails: Right Home Layout: One level Bathroom Shower/Tub: Tub/shower unit (Have walk-in shower but is small. Cannot fit shower chair inside.) Bathroom Toilet:  (comfort height) Additional Comments: Wife unsure at time of evaluation.  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 [employer] Leisure and Hobbies: Browsing the internet on his computer Prior Function Level of Independence: Independent with basic ADLs  Able to Take Stairs?: Yes Driving: Yes (Wife reports difficulty getting into SUV 2/2 RLE.) Vision Baseline Vision/History: Wears glasses Wears Glasses: At all times Patient  Visual Report: No change from baseline Vision Assessment?: Yes Eye Alignment: Within Functional Limits Ocular Range of Motion: Within Functional Limits Alignment/Gaze Preference: Within Defined Limits Tracking/Visual Pursuits: Able to track stimulus in all quads without difficulty Convergence: Within functional limits Perception  Perception: Within Functional Limits Praxis Praxis: Intact Cognition Overall Cognitive Status: Within Functional Limits for tasks assessed Arousal/Alertness: Awake/alert Orientation Level: Person;Place;Situation Person: Oriented Place: Oriented Situation: Oriented Year: 2021 Month: September Day of Week: Correct Memory: Appears intact Immediate Memory Recall: Sock;Blue;Bed Memory Recall Sock: Not able to recall Memory Recall Blue: With Cue Memory Recall Bed: With Cue Sensation Sensation Light Touch: Appears Intact Hot/Cold: Appears Intact Coordination Gross Motor Movements are Fluid and Coordinated: No Fine Motor Movements are Fluid and Coordinated: No Motor  Motor Motor: Hemiplegia Motor - Skilled Clinical Observations: R hemi. Activation of proximal musculature. Please refer to UE section for further details.  Trunk/Postural Assessment  Cervical Assessment Cervical Assessment: Within Functional Limits Thoracic Assessment Thoracic Assessment: Within Functional Limits Lumbar Assessment Lumbar Assessment: Within Functional Limits Postural Control Postural Control: Deficits on evaluation Righting Reactions: Delayed  Balance Balance Balance Assessed: Yes Static Sitting Balance Static Sitting - Balance Support: Left upper extremity supported;Feet supported Static Sitting - Level of Assistance: 4: Min assist Dynamic Sitting Balance Dynamic Sitting - Balance Support: Left upper extremity supported;Feet supported Dynamic Sitting - Level of Assistance: 3: Mod assist;4: Min assist Sitting balance - Comments: Patient able to maintain static  sitting balance at EOB for ~10 minutes with close supervision A at best and up to Min A with fatigue. Extremity/Trunk Assessment RUE Assessment RUE Assessment: Exceptions to [facility] Passive Range of Motion (PROM) Comments: WFL up to 90 degrees shoulder flex/abd to maintain integrity of shoulder joint. Active Range of Motion (AROM) Comments: Activation of proximal musculature. Able to initiate shoulder flexion with blocking to prevent compensation. Can flex elbow and make loose fist. Trace wrist/digit extension. General Strength Comments: 3+/5 shoulder/elbow. 2/5 wrist and digit flexion. 1/5 wrist/digit extension. LUE Assessment LUE Assessment: Within Functional Limits Passive Range of Motion (PROM) Comments: WFL Active Range of Motion (AROM) Comments: WFL General  Strength Comments: MMT [facility]  Care Tool Care Tool Self Care Eating        Oral Care         Bathing   Body parts bathed by patient: Right arm;Chest;Abdomen;Front perineal area;Face Body parts bathed by helper: Buttocks;Left arm;Right lower leg;Left lower leg;Left upper leg;Right upper leg   Assist Level: Maximal Assistance - Patient 24 - 49%    Upper Body Dressing(including orthotics)   What is the patient wearing?: Pull over shirt   Assist Level: Maximal Assistance - Patient 25 - 49%    Lower Body Dressing (excluding footwear)   What is the patient wearing?: Pants Assist for lower body dressing: Total Assistance - Patient < 25%    Putting on/Taking off footwear   What is the patient wearing?: Non-skid slipper socks Assist for footwear: Total Assistance - Patient < 25%       Care Tool Toileting Toileting activity         Care Tool Bed Mobility Roll left and right activity   Roll left and right assist level: 2 Helpers    Sit to lying activity   Sit to lying assist level: 2 Helpers    Lying to sitting edge of bed activity   Lying to sitting edge of bed assist level: 2 Helpers     Care Tool Transfers Sit to  stand transfer        Chair/bed transfer         Toilet transfer Toilet transfer activity did not occur: Safety/medical concerns       Care Tool Cognition Expression of Ideas and Wants Expression of Ideas and Wants: Without difficulty (complex and basic) - expresses complex messages without difficulty and with speech that is clear and easy to understand   Understanding Verbal and Non-Verbal Content Understanding Verbal and Non-Verbal Content: Understands (complex and basic) - clear comprehension without cues or repetitions   Memory/Recall Ability *first 3 days only Memory/Recall Ability *first 3 days only: Current season;Location of own room;Staff names and faces;That he or she is in a hospital/hospital unit    Refer to Care Plan for [place] 1 OT Short Term Goal 1 (Week 1): Patient will complete 2/3 parts of UB dressing seated EOB with use of hemi technique. OT Short Term Goal 2 (Week 1): Patient will thread LLE into LB clothing at bed level. OT Short Term Goal 3 (Week 1): Patient will maintain static sitting balance at EOB for 15 minutes with close supervision assist in prep for BADLs. OT Short Term Goal 4 (Week 1): Patient will complete functional transfers with LRAD and Max A +1.  Recommendations for other services: Other: TBD   Skilled Therapeutic Intervention Patient met lying supine in bed with wife present at bedside. Discussed ELOS, establishment of POC, and goals with patient/family. All questions answered. Patient completed bed level bathing and dressing with Max A to +2 assist 2/2 girth and R hemiplegia. Supine to EOB with Max A with patient able to sustain static sitting balance with close supervision at best and Min A at most with fatigue. Return to supine with +2 assist at trunk and BLE. +2 assist for supine scoot toward [facility] with bed positioned in Trendelenburg. Patient would benefit from continued skilled OT services to maximize safety and  independence with self-care tasks, functional tranfers, and mobility with LRAD in prep for safety d/c home with wife.   ADL ADL Eating: Set up Where Assessed-Eating: Bed level Grooming:  Setup Where Assessed-Grooming: Bed level Upper Body Bathing: Moderate cueing Where Assessed-Upper Body Bathing: Bed level Lower Body Bathing: Maximal assistance Where Assessed-Lower Body Bathing: Bed level Upper Body Dressing: Maximal assistance Where Assessed-Upper Body Dressing: Bed level Lower Body Dressing: Maximal assistance Where Assessed-Lower Body Dressing: Bed level Toileting: Not assessed Toilet Transfer: Not assessed Mobility  Bed Mobility Bed Mobility: Rolling Right;Rolling Left;Right Sidelying to Sit;Sitting - Scoot to [employer] of Bed;Sit to Supine Rolling Right: Maximal Assistance - Patient 25-49% Rolling Left: Maximal Assistance - Patient 25-49% Right Sidelying to Sit: 2 Helpers Sitting - Scoot to [employer] of Bed: 2 Helpers Sit to Supine: 2 Helpers   Discharge Criteria: Patient will be discharged from OT if patient refuses treatment 3 consecutive times without medical reason, if treatment goals not met, if there is a change in medical status, if patient makes no progress towards goals or if patient is discharged from hospital.  The above assessment, treatment plan, treatment alternatives and goals were discussed and mutually agreed upon: by patient  Heywood Tokunaga R [place] 06/15/2020, 11:31 AM

## 2020-06-15 NOTE — Progress Notes (Signed)
[place] PHYSICAL MEDICINE & REHABILITATION PROGRESS NOTE   Subjective/Complaints:  Slept well Wife at bedisde , discussed lengthy time course of recovery   Objective:   No results found. No results for input(s): WBC, HGB, HCT, PLT in the last 72 hours. No results for input(s): NA, K, CL, CO2, GLUCOSE, BUN, CREATININE, CALCIUM in the last 72 hours.  Intake/Output Summary (Last 24 hours) at 06/15/2020 0640 Last data filed at 06/15/2020 0458 Gross per 24 hour  Intake 360 ml  Output 550 ml  Net -190 ml        Physical Exam: Vital Signs Blood pressure (!) 150/71, pulse 73, temperature 98.2 F (36.8 C), temperature source Oral, resp. rate 18, height 5\' 10"  (1.778 m), weight 129.3 kg, SpO2 97 %.    Assessment/Plan: 1. Functional deficits secondary to Left pontine infarct  which require 3+ hours per day of interdisciplinary therapy in a comprehensive inpatient rehab setting.  Physiatrist is providing close team supervision and 24 hour management of active medical problems listed below.  Physiatrist and rehab team continue to assess barriers to discharge/monitor patient progress toward functional and medical goals  Care Tool:  Bathing              Bathing assist       Upper Body Dressing/Undressing Upper body dressing        Upper body assist      Lower Body Dressing/Undressing Lower body dressing            Lower body assist       Toileting Toileting    Toileting assist Assist for toileting: Independent with assistive device Assistive Device Comment: urinal   Transfers Chair/bed transfer  Transfers assist           Locomotion Ambulation   Ambulation assist              Walk 10 feet activity   Assist           Walk 50 feet activity   Assist           Walk 150 feet activity   Assist           Walk 10 feet on uneven surface  activity   Assist           Wheelchair     Assist                Wheelchair 50 feet with 2 turns activity    Assist            Wheelchair 150 feet activity     Assist          Blood pressure (!) 150/71, pulse 73, temperature 98.2 F (36.8 C), temperature source Oral, resp. rate 18, height 5\' 10"  (1.778 m), weight 129.3 kg, SpO2 97 %.  Medical Problem List and Plan: 1.  Right hemiparesis secondary to left pontine infarction.  Follow-up outpatient cardiology services to discuss loop recorder             -patient may shower             -ELOS/Goals: 14-17 days/supervision/min a             Admit to CIR 2.  Antithrombotics: -DVT/anticoagulation: SCDs             -antiplatelet therapy: Aspirin 325 mg daily 3. Pain Management: Tylenol as needed 4. Mood: Provide emotional support             -  antipsychotic agents: N/A 5. Neuropsych: This patient is capable of making decisions on his own behalf. 6. Skin/Wound Care: Routine skin checks 7. Fluids/Electrolytes/Nutrition: Routine in and outs.  CMP ordered for tomorrow. 8.  Hypertension.  Norvasc 10 mg daily, Cozaar 100 mg daily.               Monitor with increased mobility. Vitals:   06/14/20 1941 06/15/20 0403  BP: (!) 149/75 (!) 150/71  Pulse: 77 73  Resp: 16 18  Temp: 97.6 F (36.4 C) 98.2 F (36.8 C)  SpO2: 96% 97%  mild/mod elevation of systolic permissive for now  9.  Diabetes mellitus with hyperglycemia.  Hemoglobin A1c 7.5.  SSI.          CBG (last 3)  Recent Labs    06/15/20 0000 06/15/20 0405 06/15/20 0747  GLUCAP 140* 166* 159*  controlled 9/16  10.  BPH.  Proscar.               Check PVRs 11.  Hyperlipidemia.  Lipitor 12.  History of gout.  Zyloprim 300 mg daily.  Monitor for any gout flareups 13.  History of non-Hodgkin's lymphoma status post chemotherapy.  Follow-up Dr Randa Evens as outpatient. 14.  Morbid obesity.  BMI 40.89.  Dietary follow-up 15.  History of rectal outlet bleeding.  Felt to be related to possible hemorrhoids.  Hemoglobin hematocrit  mains stable.  He was to follow-up outpatient gastroenterology services.  Patient had deferred GI work-up while in the hospital.             CBC ordered for tomorrow.    LOS: 1 days A FACE TO FACE EVALUATION WAS PERFORMED  Charlett Blake 06/15/2020, 6:40 AM

## 2020-06-15 NOTE — Progress Notes (Signed)
   Patient Details  Name: Peter Ryan MRN: [id number] Date of Birth: 12-Aug-1951  Today's Date: 06/15/2020  Hospital Problems: Principal Problem:   Left pontine cerebrovascular accident [facility])  Past Medical History:  Past Medical History:  Diagnosis Date  . Arthritis   . BPH (benign prostatic hyperplasia)   . Cancer ([place])    Non-Hodgkins lymphoma  . Chronic prostatitis   . Cirrhosis of liver ([place])   . Diabetes mellitus without complication ([place])   . Dyslipidemia   . Elevated PSA   . Gastric reflux   . Gout   . HTN (hypertension)   . Neuropathy   . Over weight   . Psoriasis   . Testicular pain, right   . Urinary frequency    Past Surgical History:  Past Surgical History:  Procedure Laterality Date  . hydrocelectomy    . IR FLUORO GUIDE PORT INSERTION RIGHT  04/11/2017  . IR REMOVAL TUN ACCESS W/ PORT W/O FL MOD SED  05/29/2018  . PILONIDAL CYST EXCISION    . TEE WITHOUT CARDIOVERSION N/A 06/09/2020   Procedure: TRANSESOPHAGEAL ECHOCARDIOGRAM (TEE);  Surgeon: Minna Merritts, MD;  Location: [facility] ORS;  Service: Cardiovascular;  Laterality: N/A;   Social History:  reports that he quit smoking about 46 years ago. His smoking use included cigarettes. He has a 12.00 pack-year smoking history. He has never used smokeless tobacco. He reports that he does not drink alcohol and does not use drugs.  Family / Support Systems Spouse/Significant Other: Sherry Children: no children in home Other Supports: Spouse daughter Anticipated Caregiver: Designer, television/film set (Spouse) Ability/Limitations of Caregiver: Supervision/Min A Caregiver Availability: 24/7  Social History Preferred language: English Religion: None Read: Yes Write: Yes Employment Status: Retired   Abuse/Neglect Abuse/Neglect Assessment Can Be Completed: Yes Physical Abuse: Denies Verbal Abuse: Denies Sexual Abuse: Denies Exploitation of patient/patient's resources: Denies Self-Neglect: Denies  Emotional  Status Pt's affect, behavior and adjustment status: Patient as been down Recent Psychosocial Issues: no Psychiatric History: no Substance Abuse History: no  Patient / Family Perceptions, Expectations & Goals Pt/Family understanding of illness & functional limitations: Yes, spouse understanding thus far Premorbid pt/family roles/activities: Indpendent Pt/family expectations/goals: Goal to discharge home [place]: None Premorbid Home Care/DME Agencies: None Armed forces training and education officer, Radio producer) Transportation available at discharge: yes, spouse able to [employer] referrals recommended: Neuropsychology  Discharge Planning Living Arrangements: Spouse/significant other Support Systems: Spouse/significant other Type of Residence: Private residence (1 level home: 3 steps to enter (R side railings),Walk in shower (no room for shower seat)) Insurance Resources: [employer] Living Expenses: Rent Money Management: Patient, Spouse Does the patient have any problems obtaining your medications?: No Home Management: Independent Patient/Family Preliminary Plans: Able to recieve asisstance from spouse Care Coordinator Anticipated Follow Up Needs: HH/OP Expected length of stay: 14-18 Days  Clinical Impression Sw entered room, spouse at beside. Introduced self, explained role and process. Addressed questions and concerns. Patient reports being down. Added to neuro list. Sw will continue to follow up.  Dyanne Iha 06/15/2020, 1:13 PM

## 2020-06-16 ENCOUNTER — Inpatient Hospital Stay (HOSPITAL_COMMUNITY): Payer: Medicare Other | Admitting: Speech Pathology

## 2020-06-16 ENCOUNTER — Ambulatory Visit: Payer: Medicare Other

## 2020-06-16 ENCOUNTER — Inpatient Hospital Stay (HOSPITAL_COMMUNITY): Payer: Medicare Other | Admitting: Physical Therapy

## 2020-06-16 ENCOUNTER — Inpatient Hospital Stay (HOSPITAL_COMMUNITY): Payer: Medicare Other | Admitting: Occupational Therapy

## 2020-06-16 LAB — GLUCOSE, CAPILLARY
Glucose-Capillary: 154 mg/dL — ABNORMAL HIGH (ref 70–99)
Glucose-Capillary: 163 mg/dL — ABNORMAL HIGH (ref 70–99)
Glucose-Capillary: 169 mg/dL — ABNORMAL HIGH (ref 70–99)
Glucose-Capillary: 177 mg/dL — ABNORMAL HIGH (ref 70–99)
Glucose-Capillary: 179 mg/dL — ABNORMAL HIGH (ref 70–99)
Glucose-Capillary: 218 mg/dL — ABNORMAL HIGH (ref 70–99)

## 2020-06-16 NOTE — IPOC Note (Signed)
Overall Plan of Care [facility]) Patient Details Name: Peter Ryan MRN: [id number] DOB: September 14, 1951  Admitting Diagnosis: Left pontine cerebrovascular accident [facility])  Hospital Problems: Principal Problem:   Left pontine cerebrovascular accident [facility])     Functional Problem List: Nursing Bladder, Bowel, Endurance, Medication Management, Pain, Safety, Sensory, Skin Integrity, Edema  PT Balance, Pain, Behavior, Perception, Edema, Safety, Endurance, Sensory, Motor, Skin Integrity, Nutrition  OT Balance, Endurance, Motor, Pain  SLP Cognition, Linguistic, Nutrition  TR         Basic ADLs: OT Grooming, Bathing, Dressing, Toileting     Advanced  ADLs: OT       Transfers: PT Bed Mobility, Bed to Chair, Car, Chief Operating Officer: PT Ambulation, Emergency planning/management officer, Stairs     Additional Impairments: OT Fuctional Use of Upper Extremity  SLP Swallowing, Communication, Social Cognition expression Problem Solving, Memory, Awareness  TR      Anticipated Outcomes Item Anticipated Outcome  Self Feeding    Swallowing  Mod I   Basic self-care  Supervision to [employer] A  Toileting  Min A   Bathroom Transfers Min A  Bowel/Bladder  Continent with min assist  Transfers  min assist  Locomotion  min assist  Communication  Mod I  Cognition  Supervision-Mod I  Pain  <3 on a 0-10 pain scale  Safety/Judgment  min assist to supervision for appropriate safety awareness   Therapy Plan: PT Intensity: Minimum of 1-2 x/day ,45 to 90 minutes PT Frequency: 5 out of 7 days PT Duration Estimated Length of Stay: ~3.5 weeks OT Intensity: Minimum of 1-2 x/day, 45 to 90 minutes OT Frequency: 5 out of 7 days OT Duration/Estimated Length of Stay: 21-23 days SLP Intensity: Minumum of 1-2 x/day, 30 to 90 minutes SLP Frequency: 3 to 5 out of 7 days SLP Duration/Estimated Length of Stay: 3 weeks   Due to the current state of emergency, patients may not be receiving their 3-hours  of Medicare-mandated therapy.   Team Interventions: Nursing Interventions Patient/Family Education, Bladder Management, Bowel Management, Disease Management/Prevention, Pain Management, Medication Management, Skin Care/Wound Management, Discharge Planning, Psychosocial Support  PT interventions Ambulation/gait training, Cognitive remediation/compensation, Discharge planning, DME/adaptive equipment instruction, Functional mobility training, Pain management, Psychosocial support, Splinting/orthotics, Therapeutic Activities, UE/LE Strength taining/ROM, Visual/perceptual remediation/compensation, Training and development officer, Community reintegration, Disease management/prevention, Functional electrical stimulation, Neuromuscular re-education, Patient/family education, Skin care/wound management, Stair training, Therapeutic Exercise, UE/LE Coordination activities, Wheelchair propulsion/positioning  OT Interventions Training and development officer, Academic librarian, Discharge planning, DME/adaptive equipment instruction, Functional electrical stimulation, Functional mobility training, Neuromuscular re-education, Pain management, Patient/family education, Self Care/advanced ADL retraining, Therapeutic Activities, Therapeutic Exercise, UE/LE Coordination activities, Wheelchair propulsion/positioning  SLP Interventions Cognitive remediation/compensation, English as a second language teacher, Dysphagia/aspiration precaution training, Functional tasks, Patient/family education, Internal/external aids, Speech/Language facilitation  TR Interventions    SW/CM Interventions Psychosocial Support, Discharge Planning, Patient/Family Education   Barriers to Discharge MD  Medical stability and Weight  Nursing Inaccessible home environment, Decreased caregiver support, Home environment access/layout, Incontinence, Lack of/limited family support, Weight, Medication compliance    PT Inaccessible home environment, Home environment  access/layout, Neurogenic Bowel & Bladder    OT Inaccessible home environment    SLP      SW       Team Discharge Planning: Destination: PT-Home ,OT- Home , SLP-Home Projected Follow-up: PT-Home health PT, 24 hour supervision/assistance, OT-  Home health OT, 24 hour supervision/assistance, SLP-24 hour supervision/assistance, Outpatient SLP Projected Equipment Needs: PT-To be determined, OT- To be determined, SLP-None recommended by SLP  Equipment Details: PT- , OT-  Patient/family involved in discharge planning: PT- Patient, Family member/caregiver,  OT-Patient, Family member/caregiver, SLP-Patient  MD ELOS: 18-21d Medical Rehab Prognosis:  Excellent Assessment:   69 year old right-handed male with history of obesity BMI 40.89, BPH, quit smoking 46 years ago, hypertension, diabetes mellitus, history of rectal bleeding, hyperlipidemia and non-Hodgkin's lymphoma status post chemotherapy currently in remission and under surveillance followed by Dr Randa Evens.  History taken from chart review and patient.  Patient lives with spouse.  1 level home 4 steps to entry.  Independent assistive device community ambulation.  He presented on 06/05/2020 with right hemiparesis and dysarthria.  Cranial CT unremarkable for acute intercranial process.  Patient did not receive TPA.  CT angiogram of head and neck with no large vessel occlusion.  MRI of the brain showed moderate sized left pontine infarct, no associated hemorrhage or mass-effect.  Stable to perhaps slightly faded acute white matter lacunar infarct adjacent to the right frontal lobe.  Admission chemistries potassium 3.4, glucose 252, SARS coronavirus negative, hemoglobin 11.9.  Echocardiogram with ejection fraction of 35%, grade 1 diastolic dysfunction.TEE with no acute findings and did not reveal any PFO.  Awaiting plan for loop recorder which will be discussed as outpatient with cardiology service Dr. Rockey Situ.  Currently maintained on aspirin for CVA  prophylaxis.  No DAPT at this time due to history of rectal outlet bleeding.  Tolerating regular consistency diet.  Therapy evaluations completed and patient was admitted for a comprehensive rehab program.  Please see preadmission assessment from earlier today as well.   Now requiring 24/7 Rehab RN,MD, as well as CIR level PT, OT and SLP.  Treatment team will focus on ADLs and mobility with goals set at [place] See Team Conference Notes for weekly updates to the plan of care

## 2020-06-16 NOTE — Progress Notes (Signed)
Speech Language Pathology Daily Session Note  Patient Details  Name: Peter Ryan MRN: [id number] Date of Birth: [birth year]  Today's Date: 06/16/2020 SLP Individual Time: 0733-0828 SLP Individual Time Calculation (min): 55 min  Short Term Goals: Week 1: SLP Short Term Goal 1 (Week 1): Pt will consume regular solid textures and thin liquids with minimal overt s/sx aspiration and will use compensatory swallow strategies to clear right buccal pocketing Mod I. SLP Short Term Goal 2 (Week 1): Pt will use 90% intelligible speech in conversation with Supervision A verbal cues for use of compensatory strategies. SLP Short Term Goal 3 (Week 1): Pt will recall 2 speech intelligibility strategies Mod A verbal cues for recall/use of aids or strategies. SLP Short Term Goal 4 (Week 1): Pt will recall new/daily information with Mod A verbal/visual cues for use of aids/strategies. SLP Short Term Goal 5 (Week 1): Pt will demonstrate ability to problem solve mildly complex to complex tasks with Min A verbal/visual cues. SLP Short Term Goal 6 (Week 1): Pt will anticipate 2 ADLs he will need assistance with at discharge with Min A verbal/visual cues.  Skilled Therapeutic Interventions: Pt was seen for skilled ST targeting cognitive-linguistic goals. SLP facilitated session with Supervision A verbal and visual cues for problem solving and awareness of functional errors during basic medication and money management tasks. Slightly increased Min A verbal and visual cues for problem solving provided with semi-complex functional daily math problems. Pt initially required Max A for recall of speech intelligibility strategies, faded to Min A at end of session with use of acronym and written aid as strategies. Pt remained 90% intelligible in conversation throughout session. Pt left laying in bed with alarm set and needs within reach. Continue per current plan of care.          Pain Pain Assessment Pain Scale:  0-10 Pain Score: 0-No pain  Therapy/Group: Individual Therapy  Arbutus Leas 06/16/2020, 12:16 PM

## 2020-06-16 NOTE — Progress Notes (Signed)
Patient has had two mucousy bowel movements, no stool, just mucous. Large amounts of mucous each time requiring changing of the sheets.

## 2020-06-16 NOTE — Progress Notes (Signed)
Occupational Therapy Session Note  Patient Details  Name: Peter Ryan MRN: [id number] Date of Birth: June 10, 1951  Today's Date: 06/16/2020 OT Individual Time: 1431-1531 OT Individual Time Calculation (min): 60 min   Short Term Goals: Week 1:  OT Short Term Goal 1 (Week 1): Patient will complete 2/3 parts of UB dressing seated EOB with use of hemi technique. OT Short Term Goal 2 (Week 1): Patient will thread LLE into LB clothing at bed level. OT Short Term Goal 3 (Week 1): Patient will maintain static sitting balance at EOB for 15 minutes with close supervision assist in prep for BADLs. OT Short Term Goal 4 (Week 1): Patient will complete functional transfers with LRAD and Max A +1.  Skilled Therapeutic Interventions/Progress Updates:    Pt greeted in bed with no c/o pain. He declined need to toilet. Supine<sit completed with Mod A. Worked on trunk control and Rt NMR via various activities while EOB- weightbearing through Rt elbow and pushing up into neutral sitting, reaching outside of base of support to target on Rt side ~90% of the time, correcting small LOBs with cuing alone, active and active assist ROM of the Rt shoulder with education provided on self ROM/stretching of the Rt hand. Sit<stand with Mod-Max A using Stedy lift. Min A for maintaining Rt hand grip on the bar in supported standing. Sit<stand from elevated paddles completed with Min A for the Rt hand, then worked on lateral weight shifting to the Rt and returning to neutral, pt completing 5 reps 4 sets in total. Pt then had incontinent mucus BM. He returned to bed and engaged in hygiene/dressing tasks, rolling Rt>Lt with Mod A. 2nd helper present to assist with bed change. RN made aware of mucus BM. At end of session pt remained in bed with all needs within reach and bed alarm set.    Therapy Documentation Precautions:  Precautions Precautions: Fall, Other (comment) Precaution Comments: R hemiparesis Restrictions Weight  Bearing Restrictions: No Other Position/Activity Restrictions: protect weak RUE Vital Signs: Therapy Vitals Temp: 97.8 F (36.6 C) Pulse Rate: 84 Resp: 18 BP: (!) 165/88 Patient Position (if appropriate): Sitting Oxygen Therapy SpO2: 98 % O2 Device: Room Air ADL: ADL Eating: Set up Where Assessed-Eating: Bed level Grooming: Setup Where Assessed-Grooming: Bed level Upper Body Bathing: Moderate cueing Where Assessed-Upper Body Bathing: Bed level Lower Body Bathing: Maximal assistance Where Assessed-Lower Body Bathing: Bed level Upper Body Dressing: Maximal assistance Where Assessed-Upper Body Dressing: Bed level Lower Body Dressing: Maximal assistance Where Assessed-Lower Body Dressing: Bed level Toileting: Not assessed Toilet Transfer: Not assessed      Therapy/Group: Individual Therapy  Taquila Leys A Castella Lerner 06/16/2020, 4:16 PM

## 2020-06-16 NOTE — Progress Notes (Signed)
Peter Ryan PHYSICAL MEDICINE & REHABILITATION PROGRESS NOTE   Subjective/Complaints: Pt working with SLP, no issues overnite, still with occ coughing when drinking liquids  ROS neg CP, SOB, N/V/D  Objective:   No results found. Recent Labs    06/15/20 0744  WBC 7.2  HGB 12.3*  HCT 38.4*  PLT 229   Recent Labs    06/15/20 0744  NA 141  K 3.6  CL 104  CO2 26  GLUCOSE 176*  BUN 18  CREATININE 0.93  CALCIUM 8.9    Intake/Output Summary (Last 24 hours) at 06/16/2020 0817 Last data filed at 06/15/2020 2032 Gross per 24 hour  Intake 580 ml  Output 1100 ml  Net -520 ml        Physical Exam: Vital Signs Blood pressure (!) 145/74, pulse 68, temperature 97.8 F (36.6 C), temperature source Oral, resp. rate 18, height 5\' 10"  (1.778 m), weight 129.3 kg, SpO2 97 %.   General: No acute distress Mood and affect are appropriate Heart: Regular rate and rhythm no rubs murmurs or extra sounds Lungs: Clear to auscultation, breathing unlabored, no rales or wheezes Abdomen: Positive bowel sounds, soft nontender to palpation, nondistended Extremities: No clubbing, cyanosis, or edema Skin: No evidence of breakdown, no evidence of rash Neurologic: Cranial nerves II through XII intact, motor strength is 5/5 in left and 3- right deltoid, bicep, tricep, grip, hip flexor, knee extensors, ankle dorsiflexor and plantar flexor Sensory exam normal sensation to light touch and proprioception in bilateral upper and lower extremities Cerebellar exam normal finger to nose to finger as well as heel to shin in left  upper and lower extremities right side testing limited by strength  Musculoskeletal: Full range of motion in all 4 extremities. No joint swelling   Assessment/Plan: 1. Functional deficits secondary to Left pontine infarct  which require 3+ hours per day of interdisciplinary therapy in a comprehensive inpatient rehab setting.  Physiatrist is providing close team supervision and 24  hour management of active medical problems listed below.  Physiatrist and rehab team continue to assess barriers to discharge/monitor patient progress toward functional and medical goals  Care Tool:  Bathing    Body parts bathed by patient: Right arm, Chest, Abdomen, Front perineal area, Face   Body parts bathed by helper: Buttocks, Left arm, Right lower leg, Left lower leg, Left upper leg, Right upper leg     Bathing assist Assist Level: Maximal Assistance - Patient 24 - 49%     Upper Body Dressing/Undressing Upper body dressing   What is the patient wearing?: Pull over shirt    Upper body assist Assist Level: Maximal Assistance - Patient 25 - 49%    Lower Body Dressing/Undressing Lower body dressing      What is the patient wearing?: Incontinence brief     Lower body assist Assist for lower body dressing: 2 Helpers     Toileting Toileting    Toileting assist Assist for toileting: Independent with assistive device Assistive Device Comment: urinal   Transfers Chair/bed transfer  Transfers assist     Chair/bed transfer assist level: Dependent - mechanical lift (stedy)     Locomotion Ambulation   Ambulation assist   Ambulation activity did not occur: Safety/medical concerns          Walk 10 feet activity   Assist  Walk 10 feet activity did not occur: Safety/medical concerns        Walk 50 feet activity   Assist Walk 50 feet with 2  turns activity did not occur: Safety/medical concerns         Walk 150 feet activity   Assist Walk 150 feet activity did not occur: Safety/medical concerns         Walk 10 feet on uneven surface  activity   Assist Walk 10 feet on uneven surfaces activity did not occur: Safety/medical concerns         Wheelchair     Assist Will patient use wheelchair at discharge?:  (TBD)             Wheelchair 50 feet with 2 turns activity    Assist            Wheelchair 150 feet activity      Assist          Blood pressure (!) 145/74, pulse 68, temperature 97.8 F (36.6 C), temperature source Oral, resp. rate 18, height 5\' 10"  (1.778 m), weight 129.3 kg, SpO2 97 %.  Medical Problem List and Plan: 1.  Right hemiparesis secondary to left pontine infarction.  Follow-up outpatient cardiology services to discuss loop recorder             -patient may shower             -ELOS/Goals: 14-17 days/supervision/min a             CIR PT, OT,SLP  2.  Antithrombotics: -DVT/anticoagulation: SCDs             -antiplatelet therapy: Aspirin 325 mg daily 3. Pain Management: Tylenol as needed 4. Mood: Provide emotional support             -antipsychotic agents: N/A 5. Neuropsych: This patient is capable of making decisions on his own behalf. 6. Skin/Wound Care: Routine skin checks 7. Fluids/Electrolytes/Nutrition: Routine in and outs.  CMP ordered for tomorrow. 8.  Hypertension.  Norvasc 10 mg daily, Cozaar 100 mg daily.               Monitor with increased mobility. Vitals:   06/15/20 1951 06/16/20 0436  BP: (!) 150/69 (!) 145/74  Pulse: 80 68  Resp:    Temp: 98.3 F (36.8 C) 97.8 F (36.6 C)  SpO2: 91% 97%  mild/mod elevation of systolic permissive for now  9.  Diabetes mellitus with hyperglycemia.  Hemoglobin A1c 7.5.  SSI.          CBG (last 3)  Recent Labs    06/16/20 0004 06/16/20 0433 06/16/20 0736  GLUCAP 154* 169* 163*  controlled 9/17  10.  BPH.  Proscar.               Check PVRs 11.  Hyperlipidemia.  Lipitor 12.  History of gout.  Zyloprim 300 mg daily.  Monitor for any gout flareups 13.  History of non-Hodgkin's lymphoma status post chemotherapy.  Follow-up Dr Randa Evens as outpatient. 14.  Morbid obesity.  BMI 40.89.  Dietary follow-up 15.  History of rectal outlet bleeding.  Felt to be related to possible hemorrhoids.  Hemoglobin hematocrit mains stable.  He was to follow-up outpatient gastroenterology services.  Patient had deferred GI work-up while  in the hospital. Hgb low nl    LOS: 2 days A FACE TO FACE EVALUATION WAS PERFORMED  Charlett Blake 06/16/2020, 8:17 AM

## 2020-06-16 NOTE — Progress Notes (Signed)
Physical Therapy Session Note  Patient Details  Name: Peter Ryan MRN: [id number] Date of Birth: Mar 22, 1951  Today's Date: 06/16/2020 PT Individual Time:1001-1115   74 min   Short Term Goals: Week 1:  PT Short Term Goal 1 (Week 1): Pt will perform supine<>sit with max assist of 1 PT Short Term Goal 2 (Week 1): Pt will perform sit<>stands with +2 max assist using LRAD (not lift equipment) PT Short Term Goal 3 (Week 1): Pt will perform bed<>chair transfers with +2 max assist without use of lift equipment PT Short Term Goal 4 (Week 1): Pt will initiate gait training  Skilled Therapeutic Interventions/Progress Updates:   Pt received supine in bed and agreeable to PT. Pt noted to have been incontinent of bladder, reporting that he spilled the urinal. Rolling R and L to remove soilet brief and pants with mod assist and max cues for set up and improved motor planning. Supine>sit with max assist from PT due to R lateral lean. Once sitting EOB, mod assist initially to maintain balance and prevent R LOB, but progressed to supervision assist once pt achieved midline. Sit<>stand in bariatric stedy x 4 to perform hygiene and clothing management. Min assist overall once BLE placed in neutral position.   Pt transported to rail in hall. Sit<>stand at rail x 3 with min assist and R knee blocked to facilitate terminal knee extension. Gait training at rail with max A + 2, 43ft x 2 with max A+2 to block the RLE, assist with advancement, and WC follow.   Kinetron reciprocal movement training,20 cm/sec, 5 x 1.5 min with therapeutic rest break between bouts. Cues for improved reciprocal pattern and postural control to prevent posterior pushing as well as improved attention to the RLE.   Patient returned to room and left sitting in [facility] with call bell in reach and all needs met.        Therapy Documentation Precautions:  Precautions Precautions: Fall, Other (comment) Precaution Comments: R  hemiparesis Restrictions Weight Bearing Restrictions: No Other Position/Activity Restrictions: protect weak RUE Vital Signs: Therapy Vitals Temp: 97.8 F (36.6 C) Temp Source: Oral Pulse Rate: 68 BP: (!) 145/74 Patient Position (if appropriate): Lying Oxygen Therapy SpO2: 97 % O2 Device: Room Air Pain: Pain Assessment Pain Scale: 0-10 Pain Score: 0-No pain  Therapy/Group: Individual Therapy  Lorie Phenix 06/16/2020, 8:01 AM

## 2020-06-17 ENCOUNTER — Inpatient Hospital Stay (HOSPITAL_COMMUNITY): Payer: Medicare Other | Admitting: Occupational Therapy

## 2020-06-17 ENCOUNTER — Inpatient Hospital Stay (HOSPITAL_COMMUNITY): Payer: Medicare Other | Admitting: Physical Therapy

## 2020-06-17 ENCOUNTER — Inpatient Hospital Stay (HOSPITAL_COMMUNITY): Payer: Medicare Other

## 2020-06-17 LAB — GLUCOSE, CAPILLARY
Glucose-Capillary: 155 mg/dL — ABNORMAL HIGH (ref 70–99)
Glucose-Capillary: 173 mg/dL — ABNORMAL HIGH (ref 70–99)
Glucose-Capillary: 183 mg/dL — ABNORMAL HIGH (ref 70–99)
Glucose-Capillary: 185 mg/dL — ABNORMAL HIGH (ref 70–99)
Glucose-Capillary: 190 mg/dL — ABNORMAL HIGH (ref 70–99)
Glucose-Capillary: 204 mg/dL — ABNORMAL HIGH (ref 70–99)
Glucose-Capillary: 238 mg/dL — ABNORMAL HIGH (ref 70–99)

## 2020-06-17 NOTE — Progress Notes (Signed)
Speech Language Pathology Daily Session Note  Patient Details  Name: Peter Ryan MRN: [id number] Date of Birth: [birth year]  Today's Date: 06/17/2020 SLP Individual Time: 0947-0962 SLP Individual Time Calculation (min): 40 min  Short Term Goals: Week 1: SLP Short Term Goal 1 (Week 1): Pt will consume regular solid textures and thin liquids with minimal overt s/sx aspiration and will use compensatory swallow strategies to clear right buccal pocketing Mod I. SLP Short Term Goal 2 (Week 1): Pt will use 90% intelligible speech in conversation with Supervision A verbal cues for use of compensatory strategies. SLP Short Term Goal 3 (Week 1): Pt will recall 2 speech intelligibility strategies Mod A verbal cues for recall/use of aids or strategies. SLP Short Term Goal 4 (Week 1): Pt will recall new/daily information with Mod A verbal/visual cues for use of aids/strategies. SLP Short Term Goal 5 (Week 1): Pt will demonstrate ability to problem solve mildly complex to complex tasks with Min A verbal/visual cues. SLP Short Term Goal 6 (Week 1): Pt will anticipate 2 ADLs he will need assistance with at discharge with Min A verbal/visual cues.  Skilled Therapeutic Interventions: Skilled SLP intervention focused on cognition. Pt read medicaion labels to locatate relevant information with 90% accuracy and min to no assistance required. Moderately complex auditory processing task requiring pt to complete time calculations mentally completed with 80% accuracy with increased time and min A verbal cues for repetition. Pt's speech is 85-90% intelligible and required mod A verbal cues for use of overarticulation and segmenting words with less structured conversational task. Pt left seated upright in bed with wife present. Call bell within reach and bed alarm set. Cont with therapy per plan of care.     Pain Pain Assessment Pain Scale: Faces Pain Score: 0-No pain Faces Pain Scale: No hurt  Therapy/Group:  Individual Therapy  Peter Ryan Peter Ryan 06/17/2020, 11:58 AM

## 2020-06-17 NOTE — Progress Notes (Signed)
Physical Therapy Session Note  Patient Details  Name: Peter Ryan MRN: [id number] Date of Birth: 1950/10/15  Today's Date: 06/17/2020 PT Individual Time: 0900-1008 PT Individual Time Calculation (min): 68 min   Short Term Goals: Week 1:  PT Short Term Goal 1 (Week 1): Pt will perform supine<>sit with max assist of 1 PT Short Term Goal 2 (Week 1): Pt will perform sit<>stands with +2 max assist using LRAD (not lift equipment) PT Short Term Goal 3 (Week 1): Pt will perform bed<>chair transfers with +2 max assist without use of lift equipment PT Short Term Goal 4 (Week 1): Pt will initiate gait training  Skilled Therapeutic Interventions/Progress Updates:   Pt received supine in bed and agreeable to PT. Supine>sit transfer with max assist from PT for safety and min cues for improved anterior weight shift to prevent posterior LOB as well as use of RUE to push into midline. Sitting balance EOB with supervision assist x 5 minutes with min cues for improved LE position and attention to the RUE to prevent lateral lean. Stand pivot transfer to [facility] with max assist to block the RLE  And RUE over PT shoulder. Sit<>stand from [facility] with mod assist and RLE blocked into extension to pull pants to waist with +2 assist from wife.   Pt transported day room in TIS WC. Sit<>stand from [facility] with mod assist x 2 to don lite gait harness. RLE blocked into extension throughout standing and UE supported on hand rails of litegait.  Lite gait BWSTT 1.5 min +2.5 min, 0.5 mph. With max assist to advance/stabilize RLE. DF wrap added on second bout to improve foot clearance as well as medium resistance tube to stabilize pelvis on the R side. Noted improvement in posture, step length, and heel contact on the RLE hrouhgout second bout.    Seated NMR:  LAQ x 12 Ankle DF x 15 Hip abduction x 12 Hip adduction x 12  Hip/knee flexion/extension x 12.  AAROM from PT on the RLE as well as cues for full ROM and decreased compensation  from PT.   Patient returned to room and left sitting in [place] with call bell in reach and all needs met.        Therapy Documentation Precautions:  Precautions Precautions: Fall, Other (comment) Precaution Comments: R hemiparesis Restrictions Weight Bearing Restrictions: No Other Position/Activity Restrictions: protect weak RUE   Pain: Pain Assessment Pain Scale: 0-10 Pain Score: 0-No pain    Therapy/Group: Individual Therapy  Lorie Phenix 06/17/2020, 10:09 AM

## 2020-06-17 NOTE — Progress Notes (Signed)
Physical Therapy Session Note  Patient Details  Name: Peter Ryan MRN: [id number] Date of Birth: 11-22-1950  Today's Date: 06/17/2020 PT Individual Time: 2355-7322 PT Individual Time Calculation (min): 15 min   Short Term Goals: Week 1:  PT Short Term Goal 1 (Week 1): Pt will perform supine<>sit with max assist of 1 PT Short Term Goal 2 (Week 1): Pt will perform sit<>stands with +2 max assist using LRAD (not lift equipment) PT Short Term Goal 3 (Week 1): Pt will perform bed<>chair transfers with +2 max assist without use of lift equipment PT Short Term Goal 4 (Week 1): Pt will initiate gait training  Skilled Therapeutic Interventions/Progress Updates:     Pt received sitting in WC, requesting to return to bed. Stand pivot transfer with max assist from PT to block the RLE as well as max cues for LE positioning, midline awareness, and proper use of LUE to push from arm rest on chair. Max assist to perform sit>supine for RLE and trunk control to improve positioning in bed. Cues for sequencing from PT as well awareness of RUE through transfer. Pt left supine in be with call bell in reach with all needs met.    Therapy Documentation Precautions:  Precautions Precautions: Fall, Other (comment) Precaution Comments: R hemiparesis Restrictions Weight Bearing Restrictions: No Other Position/Activity Restrictions: protect weak RUE Vital Signs: Therapy Vitals Temp:  (not taken- pt recently drank a cold drink temp read 94.3-pr ) Pulse Rate: 77 Resp: 17 BP: (!) 155/72 Patient Position (if appropriate): Sitting Oxygen Therapy SpO2: 98 % O2 Device: Room Air  Therapy/Group: Individual Therapy  Lorie Phenix 06/17/2020, 4:56 PM

## 2020-06-17 NOTE — Progress Notes (Signed)
Occupational Therapy Session Note  Patient Details  Name: Peter Ryan MRN: [id number] Date of Birth: 1950-12-17  Today's Date: 06/17/2020 OT Individual Time: 1300-1430 Individual OT time calculation=90 minutes  Short Term Goals: Week 1:  OT Short Term Goal 1 (Week 1): Patient will complete 2/3 parts of UB dressing seated EOB with use of hemi technique. OT Short Term Goal 2 (Week 1): Patient will thread LLE into LB clothing at bed level. OT Short Term Goal 3 (Week 1): Patient will maintain static sitting balance at EOB for 15 minutes with close supervision assist in prep for BADLs. OT Short Term Goal 4 (Week 1): Patient will complete functional transfers with LRAD and Max A +1.  Skilled Therapeutic Interventions/Progress Updates: Patient participated in session as follows:  Lateral rolls = max A to check brief as he asked if it was soiled of BM.   Supine to Edge of Bed=total Ax1;     Patient sat edge of bed to participated in much breaking up of tone and pushing to right.     Edge of bed to xwide drop arm 3:1= Max A x2 stand pivot  Patient asked for extra time to try to have BM..... as a small smear produced.  Toileting= total A x2  Patient fatigued ater toileting and was unable to attempt stand of or squat pivot. Patient was able to pull up in stand pivot with contact guard assist.   Due to body girth, he was not able to push up with arm from 3:1 to w/c   Hemi upper body  techniques .Marland Kitchen..educated dpatient and wife to doff old shirt and don new shirt.  He required extra time to process steps and asked for repeat of insttructions.   Wife wwas educated to give him time to process commands and problem solve rather than if he did not answer or produce action at her command that she immediately corrected him.  He was able to don shirt with extra time and moderate assistance.  For visual processing he had a bit of difficulty with topographical orientation of his sold colored dark teal  shirt.  He as leaned bk in  Tilt chair at end of session with alrm belt engaged.   Wife was present  Continue OT Plan of Care      Therapy Documentation Precautions:  Precautions Precautions: Fall, Other (comment) Precaution Comments: R hemiparesis Restrictions Weight Bearing Restrictions: No Other Position/Activity Restrictions: protect weak RUE  Pain: Pain Assessment Pain Scale: Faces Faces Pain Scale: No hurt   Therapy/Group: Individual Therapy  Alfredia Ferguson [facility] 06/17/2020, 3:35 PM

## 2020-06-18 LAB — GLUCOSE, CAPILLARY
Glucose-Capillary: 152 mg/dL — ABNORMAL HIGH (ref 70–99)
Glucose-Capillary: 160 mg/dL — ABNORMAL HIGH (ref 70–99)
Glucose-Capillary: 162 mg/dL — ABNORMAL HIGH (ref 70–99)
Glucose-Capillary: 187 mg/dL — ABNORMAL HIGH (ref 70–99)
Glucose-Capillary: 200 mg/dL — ABNORMAL HIGH (ref 70–99)
Glucose-Capillary: 206 mg/dL — ABNORMAL HIGH (ref 70–99)

## 2020-06-18 NOTE — Progress Notes (Signed)
[place] PHYSICAL MEDICINE & REHABILITATION PROGRESS NOTE   Subjective/Complaints:  no issues overnite , discussed elevated CBG, had been on metformin in past -made him sick ROS neg CP, SOB, N/V/D  Objective:   No results found. No results for input(s): WBC, HGB, HCT, PLT in the last 72 hours. No results for input(s): NA, K, CL, CO2, GLUCOSE, BUN, CREATININE, CALCIUM in the last 72 hours.  Intake/Output Summary (Last 24 hours) at 06/18/2020 1104 Last data filed at 06/18/2020 0400 Gross per 24 hour  Intake --  Output 350 ml  Net -350 ml        Physical Exam: Vital Signs Blood pressure 137/68, pulse 66, temperature 97.8 F (36.6 C), resp. rate 15, height 5\' 10"  (1.778 m), weight 129.3 kg, SpO2 98 %.    General: No acute distress Mood and affect are appropriate Heart: Regular rate and rhythm no rubs murmurs or extra sounds Lungs: Clear to auscultation, breathing unlabored, no rales or wheezes Abdomen: Positive bowel sounds, soft nontender to palpation, nondistended Extremities: No clubbing, cyanosis, or edema Skin: No evidence of breakdown, no evidence of rash  Cerebellar exam normal finger to nose to finger as well as heel to shin in left  upper and lower extremities right side testing limited by strength  Musculoskeletal: Full range of motion in all 4 extremities. No joint swelling   Assessment/Plan: 1. Functional deficits secondary to Left pontine infarct  which require 3+ hours per day of interdisciplinary therapy in a comprehensive inpatient rehab setting.  Physiatrist is providing close team supervision and 24 hour management of active medical problems listed below.  Physiatrist and rehab team continue to assess barriers to discharge/monitor patient progress toward functional and medical goals  Care Tool:  Bathing  Bathing activity did not occur:  (N/A) Body parts bathed by patient: Right arm, Chest, Abdomen, Front perineal area, Face   Body parts bathed by  helper: Buttocks, Left arm, Right lower leg, Left lower leg, Left upper leg, Right upper leg     Bathing assist Assist Level: Maximal Assistance - Patient 24 - 49%     Upper Body Dressing/Undressing Upper body dressing   What is the patient wearing?: Hospital gown only    Upper body assist Assist Level: Maximal Assistance - Patient 25 - 49%    Lower Body Dressing/Undressing Lower body dressing      What is the patient wearing?: Incontinence brief     Lower body assist Assist for lower body dressing: 2 Helpers     Toileting Toileting    Toileting assist Assist for toileting: 2 Helpers Assistive Device Comment: urinal   Transfers Chair/bed transfer  Transfers assist  Chair/bed transfer activity did not occur: N/A  Chair/bed transfer assist level: Dependent - mechanical lift (stedy)     Locomotion Ambulation   Ambulation assist   Ambulation activity did not occur: N/A          Walk 10 feet activity   Assist  Walk 10 feet activity did not occur: N/A        Walk 50 feet activity   Assist Walk 50 feet with 2 turns activity did not occur: N/A         Walk 150 feet activity   Assist Walk 150 feet activity did not occur: N/A         Walk 10 feet on uneven surface  activity   Assist Walk 10 feet on uneven surfaces activity did not occur: N/A  Wheelchair     Assist Will patient use wheelchair at discharge?:  (TBD)             Wheelchair 50 feet with 2 turns activity    Assist            Wheelchair 150 feet activity     Assist          Blood pressure 137/68, pulse 66, temperature 97.8 F (36.6 C), resp. rate 15, height 5\' 10"  (1.778 m), weight 129.3 kg, SpO2 98 %.  Medical Problem List and Plan: 1.  Right hemiparesis secondary to left pontine infarction.  Follow-up outpatient cardiology services to discuss loop recorder             -patient may shower             -ELOS/Goals: 14-17  days/supervision/min a             CIR PT, OT,SLP  2.  Antithrombotics: -DVT/anticoagulation: SCDs             -antiplatelet therapy: Aspirin 325 mg daily 3. Pain Management: Tylenol as needed 4. Mood: Provide emotional support             -antipsychotic agents: N/A 5. Neuropsych: This patient is capable of making decisions on his own behalf. 6. Skin/Wound Care: Routine skin checks 7. Fluids/Electrolytes/Nutrition: Routine in and outs.  CMP ordered for tomorrow. 8.  Hypertension.  Norvasc 10 mg daily, Cozaar 100 mg daily.               Monitor with increased mobility. Vitals:   06/17/20 2007 06/18/20 0406  BP: (!) 149/76 137/68  Pulse: 72 66  Resp: 17 15  Temp: 97.8 F (36.6 C) 97.8 F (36.6 C)  SpO2: 97% 98%  mild/mod elevation of systolic permissive for now  9.  Diabetes mellitus with hyperglycemia.  Hemoglobin A1c 7.5.  SSI.          CBG (last 3)  Recent Labs    06/17/20 2334 06/18/20 0400 06/18/20 0755  GLUCAP 155* 162* 200*   elevated 9/19  10.  BPH.  Proscar.               Check PVRs 11.  Hyperlipidemia.  Lipitor 12.  History of gout.  Zyloprim 300 mg daily.  Monitor for any gout flareups 13.  History of non-Hodgkin's lymphoma status post chemotherapy.  Follow-up Dr Randa Evens as outpatient. 14.  Morbid obesity.  BMI 40.89.  Dietary follow-up 15.  History of rectal outlet bleeding.  Felt to be related to possible hemorrhoids.  Hemoglobin hematocrit mains stable.  He was to follow-up outpatient gastroenterology services.  Patient had deferred GI work-up while in the hospital. Hgb low nl    LOS: 4 days A FACE TO FACE EVALUATION WAS PERFORMED  Charlett Blake 06/18/2020, 11:04 AM

## 2020-06-19 ENCOUNTER — Inpatient Hospital Stay (HOSPITAL_COMMUNITY): Payer: Medicare Other

## 2020-06-19 ENCOUNTER — Inpatient Hospital Stay (HOSPITAL_COMMUNITY): Payer: Medicare Other | Admitting: Physical Therapy

## 2020-06-19 ENCOUNTER — Ambulatory Visit: Admit: 2020-06-19 | Payer: Medicare Other | Admitting: Gastroenterology

## 2020-06-19 ENCOUNTER — Inpatient Hospital Stay (HOSPITAL_COMMUNITY): Payer: Medicare Other | Admitting: Occupational Therapy

## 2020-06-19 LAB — GLUCOSE, CAPILLARY
Glucose-Capillary: 169 mg/dL — ABNORMAL HIGH (ref 70–99)
Glucose-Capillary: 176 mg/dL — ABNORMAL HIGH (ref 70–99)
Glucose-Capillary: 172 mg/dL — ABNORMAL HIGH (ref 70–99)
Glucose-Capillary: 163 mg/dL — ABNORMAL HIGH (ref 70–99)
Glucose-Capillary: 157 mg/dL — ABNORMAL HIGH (ref 70–99)

## 2020-06-19 SURGERY — COLONOSCOPY WITH PROPOFOL
Anesthesia: General

## 2020-06-19 MED ORDER — LOSARTAN POTASSIUM 50 MG PO TABS
100.0000 mg | ORAL_TABLET | Freq: Every day | ORAL | Status: DC
  Administered 2020-06-20 – 2020-07-10 (×21): 100 mg via ORAL
  Filled 2020-06-19 (×21): qty 2

## 2020-06-19 MED ORDER — AMLODIPINE BESYLATE 10 MG PO TABS
10.0000 mg | ORAL_TABLET | Freq: Every day | ORAL | Status: DC
  Administered 2020-06-20 – 2020-07-10 (×21): 10 mg via ORAL
  Filled 2020-06-19 (×21): qty 1

## 2020-06-19 MED ORDER — SORBITOL 70 % SOLN
60.0000 mL | Status: AC
  Administered 2020-06-19: 60 mL via ORAL
  Filled 2020-06-19: qty 60

## 2020-06-19 NOTE — Progress Notes (Signed)
Speech Language Pathology Daily Session Note  Patient Details  Name: Peter Ryan MRN: [id number] Date of Birth: Aug 06, 1951  Today's Date: 06/19/2020 SLP Individual Time: 0805-0900 SLP Individual Time Calculation (min): 55 min  Short Term Goals: Week 1: SLP Short Term Goal 1 (Week 1): Pt will consume regular solid textures and thin liquids with minimal overt s/sx aspiration and will use compensatory swallow strategies to clear right buccal pocketing Mod I. SLP Short Term Goal 2 (Week 1): Pt will use 90% intelligible speech in conversation with Supervision A verbal cues for use of compensatory strategies. SLP Short Term Goal 3 (Week 1): Pt will recall 2 speech intelligibility strategies Mod A verbal cues for recall/use of aids or strategies. SLP Short Term Goal 4 (Week 1): Pt will recall new/daily information with Mod A verbal/visual cues for use of aids/strategies. SLP Short Term Goal 5 (Week 1): Pt will demonstrate ability to problem solve mildly complex to complex tasks with Min A verbal/visual cues. SLP Short Term Goal 6 (Week 1): Pt will anticipate 2 ADLs he will need assistance with at discharge with Min A verbal/visual cues.  Skilled Therapeutic Interventions:Skilled ST services focused on cognitive skills. Pt refused to consume breakfast due to reduced appetite, but was agreeable to consume 1/2 cup of yogurt. Pt demonstrated oral clearance and no overt s/s aspiration when consuming dys 1 textures and thin via straw. Pt demonstrated good awareness of previous medication names/functions and times per day. SLP recorded novel medications and pt demonstrated mod I verbal problem solving for medication times per day and required supervision A verbal cues for recall with visual aid.Pt had questions pertaining to need for medication (statin) and times to take blood pressure medication. SLP provided supervision A verbal cues to recall questions when pt was communicating with MD. Pt required min A  fade to supervision A verbal cues for use of slow rate and over articulation in conversation.SLP will complete BID pill organizer in next treatment session. Pt was left in room with call bell within reach and bed alarm set. SLP recommends to continue skilled services.     Pain Pain Assessment Pain Score: 0-No pain  Therapy/Group: Individual Therapy  Daaiel Starlin  [facility] 06/19/2020, 3:05 PM

## 2020-06-19 NOTE — Progress Notes (Signed)
Physical Therapy Session Note  Patient Details  Name: Peter Ryan MRN: [id number] Date of Birth: October 25, 1950  Today's Date: 06/19/2020 PT Individual Time: 2992-4268 PT Individual Time Calculation (min): 79 min   Short Term Goals: Week 1:  PT Short Term Goal 1 (Week 1): Pt will perform supine<>sit with max assist of 1 PT Short Term Goal 2 (Week 1): Pt will perform sit<>stands with +2 max assist using LRAD (not lift equipment) PT Short Term Goal 3 (Week 1): Pt will perform bed<>chair transfers with +2 max assist without use of lift equipment PT Short Term Goal 4 (Week 1): Pt will initiate gait training  Skilled Therapeutic Interventions/Progress Updates: Pt presented in bed agreeable to therapy. Pt denies pain during session. Performed supine to sit with modA and use of bed features. Pt indicated possibly need for urinary void, set up with Stedy and performed STS with modA x 1. In stedy attempted to void however unsuccessful at present time. Performed stand in [place] and PTA assisted with clothing management then transferred to [place]. Pt transported to rehab gym and performed squat pivot transfer to max maxA x 2 with mod cues for sequencing. Use of mirror for visual feedback to maintain midline statically. Pt then participated in reaching and placing cards with LUE performed x 9 cards three rounds forwards to R and L. Pt was able to reach moderate challenges and return to midline with min verbal cues. Pt also participated in ball taps with both L and RUE for dynamic balance and forced use of RUE. Pt was able to reach R hand towards midline and maintain neutral position. Pt then transferred back to [place] x2. Pt then transported to wall rail and participated in gait training for forced use of RLE and wt bearing of RLE. Pt was able to ambulate 66ft and 54ft with maxA and chair follow. Pt was able to advance RLE but required assistance for placement due to foot being significantly externally rotated. PTA  blocked R knee in stance phase however with facilitation pt was able to activate quad to achieve TKE approx 50% of time. Mirror was used to promote upright posture and midline orientation. Pt indicated after gait training possible need for BM. Pt was transported back to room and used Stedy for transfer to toilet. Total A for clothing management. Pt sat at toilet with Stedy placed in front of pt for safety for approx 5 min with pt unsuccessful. Pt stood in [place] from toilet minA and PTA provided total A for clothing management. Pt transferred back to TIS and pt agreeable to stay in TIS for approx 1 hour. PTA provided edu to pt's wife on use of TIS so that chair can be repositioned to pt's comfort as needed when she is present. Pt left in TIS at end of session with belt alarm on, call bell within reach and current needs met.       Therapy Documentation Precautions:  Precautions Precautions: Fall, Other (comment) Precaution Comments: R hemiparesis Restrictions Weight Bearing Restrictions: No Other Position/Activity Restrictions: protect weak RUE General:   Vital Signs: Therapy Vitals Temp: 97.7 F (36.5 C) Pulse Rate: 73 Resp: 18 BP: (!) 154/67 Patient Position (if appropriate): Lying Oxygen Therapy SpO2: 97 % O2 Device: Room Air Pain: Pain Assessment Pain Score: 0-No pain Mobility:   Locomotion :    Trunk/Postural Assessment :    Balance:   Exercises:   Other Treatments:      Therapy/Group: Individual Therapy  Peter Ryan 06/19/2020, 4:41  PM  

## 2020-06-19 NOTE — Progress Notes (Signed)
Occupational Therapy Session Note  Patient Details  Name: Peter Ryan MRN: [id number] Date of Birth: [birth year]  Today's Date: 06/19/2020 OT Individual Time: 0902-1000 OT Individual Time Calculation (min): 58 min   Short Term Goals: Week 1:  OT Short Term Goal 1 (Week 1): Patient will complete 2/3 parts of UB dressing seated EOB with use of hemi technique. OT Short Term Goal 2 (Week 1): Patient will thread LLE into LB clothing at bed level. OT Short Term Goal 3 (Week 1): Patient will maintain static sitting balance at EOB for 15 minutes with close supervision assist in prep for BADLs. OT Short Term Goal 4 (Week 1): Patient will complete functional transfers with LRAD and Max A +1.  Skilled Therapeutic Interventions/Progress Updates:    Pt greeted via SLP handoff. No c/o pain and agreeable to shower this AM. Max A for bed mobility and then pt completed sit<stand in [place] with Min A of 1, 2nd person present on standby. Pt transferred to the bariatric BSC placed in shower and doffed clothing using Stedy sit<stand. He then bathed while seated, Mod HOH to use the Rt UE to wash his Lt side, noted increased grasp strength this session however he still has trouble fully extending digits during release. Able to wash Lt forearm by himself but he lacks the proximal strength needed to reach his underarm unassisted. OT placed him in modified figure 4 position for bilateral hip stretching when washing feet, assistance needed for washing buttocks but pt was able to complete frontal hygiene with 1 BSC bar lowered and abducting Lt hip. Sit<stand in [place] completed with CGA afterwards. Focused on unsupported sitting balance while he then engaged in dressing tasks sit<stand using Stedy while seated EOB. Pt with posterior lean needing Min-Mod A for balance while participating in dressing. Static sitting with supervision/cuing to correct Rt lean, these cues were also needed while semi perched in [place] during  transfers. At end of session pt wanted to return to bed, Mod A to return to supine. Left pt comfortably in bed with hemiplegic side protected, all needs within reach and bed alarm set.    Therapy Documentation Precautions:  Precautions Precautions: Fall, Other (comment) Precaution Comments: R hemiparesis Restrictions Weight Bearing Restrictions: No Other Position/Activity Restrictions: protect weak RUE Pain: Pain Assessment Pain Scale: 0-10 Pain Score: 0-No pain ADL: ADL Eating: Set up Where Assessed-Eating: Bed level Grooming: Setup Where Assessed-Grooming: Bed level Upper Body Bathing: Moderate cueing Where Assessed-Upper Body Bathing: Bed level Lower Body Bathing: Maximal assistance Where Assessed-Lower Body Bathing: Bed level Upper Body Dressing: Maximal assistance Where Assessed-Upper Body Dressing: Bed level Lower Body Dressing: Maximal assistance Where Assessed-Lower Body Dressing: Bed level Toileting: Not assessed Toilet Transfer: Not assessed      Therapy/Group: Individual Therapy  Kaitlynn Tramontana A [place] [facility]r 06/19/2020, 12:36 PM

## 2020-06-19 NOTE — Progress Notes (Addendum)
[place] PHYSICAL MEDICINE & REHABILITATION PROGRESS NOTE   Subjective/Complaints: Has questions about timing of bp meds. Having mucousy stools--last stool 9/13, refusing suppository. Doesn't want to be on statin " due to brain damage" it can cause.   ROS: Patient denies fever, rash, sore throat, blurred vision,   vomiting, diarrhea, cough, shortness of breath or chest pain, joint or back pain, headache, or mood change.   Objective:   No results found. No results for input(s): WBC, HGB, HCT, PLT in the last 72 hours. No results for input(s): NA, K, CL, CO2, GLUCOSE, BUN, CREATININE, CALCIUM in the last 72 hours.  Intake/Output Summary (Last 24 hours) at 06/19/2020 1117 Last data filed at 06/19/2020 0409 Gross per 24 hour  Intake --  Output 375 ml  Net -375 ml        Physical Exam: Vital Signs Blood pressure (!) 147/71, pulse 70, temperature 97.8 F (36.6 C), temperature source Oral, resp. rate 18, height 5\' 10"  (1.778 m), weight 129.3 kg, SpO2 96 %.    Constitutional: No distress . Vital signs reviewed. HEENT: EOMI, oral membranes moist Neck: supple Cardiovascular: RRR without murmur. No JVD    Respiratory/Chest: CTA Bilaterally without wheezes or rales. Normal effort    GI/Abdomen: BS +, non-tender, non-distended Ext: no clubbing, cyanosis, or edema Psych: pleasant and cooperative Skin: No evidence of breakdown, no evidence of rash RUE 2 to 2+/5. RLE 2+/5 Cerebellar exam normal finger to nose to finger as well as heel to shin in left  upper and lower extremities right side testing limited by strength  Musculoskeletal: Full range of motion in all 4 extremities. No joint swelling   Assessment/Plan: 1. Functional deficits secondary to Left pontine infarct  which require 3+ hours per day of interdisciplinary therapy in a comprehensive inpatient rehab setting.  Physiatrist is providing close team supervision and 24 hour management of active medical problems listed  below.  Physiatrist and rehab team continue to assess barriers to discharge/monitor patient progress toward functional and medical goals  Care Tool:  Bathing  Bathing activity did not occur:  (N/A) Body parts bathed by patient: Right arm, Chest, Abdomen, Front perineal area, Face, Right upper leg, Left upper leg   Body parts bathed by helper: Left arm, Buttocks, Right lower leg, Left lower leg     Bathing assist Assist Level: Moderate Assistance - Patient 50 - 74%     Upper Body Dressing/Undressing Upper body dressing   What is the patient wearing?: Pull over shirt    Upper body assist Assist Level: Maximal Assistance - Patient 25 - 49%    Lower Body Dressing/Undressing Lower body dressing      What is the patient wearing?: Incontinence brief, Pants     Lower body assist Assist for lower body dressing: Total Assistance - Patient < 25%     Toileting Toileting    Toileting assist Assist for toileting: 2 Helpers Assistive Device Comment: urinal   Transfers Chair/bed transfer  Transfers assist  Chair/bed transfer activity did not occur: N/A  Chair/bed transfer assist level: Dependent - mechanical lift (stedy)     Locomotion Ambulation   Ambulation assist   Ambulation activity did not occur: N/A          Walk 10 feet activity   Assist  Walk 10 feet activity did not occur: N/A        Walk 50 feet activity   Assist Walk 50 feet with 2 turns activity did not occur: N/A  Walk 150 feet activity   Assist Walk 150 feet activity did not occur: N/A         Walk 10 feet on uneven surface  activity   Assist Walk 10 feet on uneven surfaces activity did not occur: N/A         Wheelchair     Assist Will patient use wheelchair at discharge?:  (TBD)             Wheelchair 50 feet with 2 turns activity    Assist            Wheelchair 150 feet activity     Assist          Blood pressure (!) 147/71,  pulse 70, temperature 97.8 F (36.6 C), temperature source Oral, resp. rate 18, height 5\' 10"  (1.778 m), weight 129.3 kg, SpO2 96 %.  Medical Problem List and Plan: 1.  Right hemiparesis secondary to left pontine infarction.  Follow-up outpatient cardiology services to discuss loop recorder             -patient may shower             -ELOS/Goals: 14-17 days/supervision/min a             CIR PT, OT,SLP  2.  Antithrombotics: -DVT/anticoagulation: SCDs             -antiplatelet therapy: Aspirin 325 mg daily 3. Pain Management: Tylenol as needed 4. Mood: Provide emotional support             -antipsychotic agents: N/A 5. Neuropsych: This patient is capable of making decisions on his own behalf. 6. Skin/Wound Care: Routine skin checks 7. Fluids/Electrolytes/Nutrition: Routine in and outs.  CMP ordered for tomorrow. 8.  Hypertension.  Norvasc 10 mg daily, Cozaar 100 mg daily.              will change medications to PM schedule per pt request. May have elevation tomorrow due to already receiving AM meds today Vitals:   06/18/20 1937 06/19/20 0408  BP: (!) 158/75 (!) 147/71  Pulse: 78 70  Resp: 20 18  Temp: 98.1 F (36.7 C) 97.8 F (36.6 C)  SpO2: 92% 96%  9.  Diabetes mellitus with hyperglycemia.  Hemoglobin A1c 7.5.  SSI.          CBG (last 3)  Recent Labs    06/18/20 2351 06/19/20 0406 06/19/20 0743  GLUCAP 160* 169* 176*  fair to borderline control over last 24  10.  BPH.  Proscar.               Check PVRs 11.  Hyperlipidemia.  Lipitor--discussed rationale for statin after stroke and in general. He wishes to hold regardless 12.  History of gout.  Zyloprim 300 mg daily.  Monitor for any gout flareups 13.  History of non-Hodgkin's lymphoma status post chemotherapy.  Follow-up Dr Randa Evens as outpatient. 14.  Morbid obesity.  BMI 40.89.  Dietary follow-up 15.  History of rectal outlet bleeding.  Felt to be related to possible hemorrhoids.  Hemoglobin hematocrit mains stable.   He was to follow-up outpatient gastroenterology services.  Patient had deferred GI work-up while in the hospital. Hgb low nl 16. Constipation which will only exacerbate #15. No bm since 9/13  -sorbitol, KUB  -SSE later today if no results.   -spoke with patient regarding plan. He agreed.     LOS: 5 days A FACE TO FACE EVALUATION WAS PERFORMED  Meredith Staggers  06/19/2020, 11:17 AM

## 2020-06-19 NOTE — Progress Notes (Signed)
Patient's last BM was 9/13. He has refused dulcolax suppository. Education provided. Patient also had some clear mucous coming out from his rectum medium in amount when he tried to move his bowels last night.

## 2020-06-20 ENCOUNTER — Inpatient Hospital Stay (HOSPITAL_COMMUNITY): Payer: Medicare Other

## 2020-06-20 ENCOUNTER — Inpatient Hospital Stay (HOSPITAL_COMMUNITY): Payer: Medicare Other | Admitting: Occupational Therapy

## 2020-06-20 LAB — GLUCOSE, CAPILLARY
Glucose-Capillary: 177 mg/dL — ABNORMAL HIGH (ref 70–99)
Glucose-Capillary: 174 mg/dL — ABNORMAL HIGH (ref 70–99)
Glucose-Capillary: 143 mg/dL — ABNORMAL HIGH (ref 70–99)
Glucose-Capillary: 156 mg/dL — ABNORMAL HIGH (ref 70–99)

## 2020-06-20 MED ORDER — INFLUENZA VAC A&B SA ADJ QUAD 0.5 ML IM PRSY
0.5000 mL | PREFILLED_SYRINGE | INTRAMUSCULAR | Status: DC
  Filled 2020-06-20: qty 0.5

## 2020-06-20 NOTE — Plan of Care (Signed)
Nutrition Education Note  RD consulted for nutrition education regarding diabetes.  Spoke with pt and wife at bedside. Pt reports that he does not typically check his blood sugar at bedside. Pt does not take DM medication and is not on insulin at baseline. Pt does not typically follow a diabetic diet. He eats whatever he wants to at home.  Lab Results  Component Value Date   HGBA1C 7.5 (H) 06/06/2020    RD provided "Carbohydrate Counting for People with Diabetes" handout from the Academy of [employer]. Discussed different food groups and their effects on blood sugar, emphasizing carbohydrate-containing foods. Provided list of carbohydrates and recommended serving sizes of common foods.  Discussed importance of controlled and consistent carbohydrate intake throughout the day. Provided examples of ways to balance meals/snacks and encouraged intake of high-fiber, whole grain complex carbohydrates. Teach back method used.  Expect good compliance.  Body mass index is 40.89 kg/m. Pt meets criteria for obesity class I based on current BMI.  Current diet order is Heart Healthy/Carb Modified, patient is consuming mostly 100% of meals at this time. Labs and medications reviewed. No further nutrition interventions warranted at this time. RD contact information provided. If additional nutrition issues arise, please re-consult RD.   Gaynell Face, MS, RD, LDN Inpatient Clinical Dietitian Please see [facility] for contact information.

## 2020-06-20 NOTE — Progress Notes (Signed)
[place] PHYSICAL MEDICINE & REHABILITATION PROGRESS NOTE   Subjective/Complaints: No complaints.  Denies pain. Had several BM after laxatives over the weekend Sleeping well.   ROS: Patient denies fever, rash, sore throat, blurred vision,   vomiting, diarrhea, cough, shortness of breath or chest pain, joint or back pain, headache, or mood change.   Objective:   DG Abd 1 View  Result Date: 06/19/2020 CLINICAL DATA:  Constipation EXAM: ABDOMEN - 1 VIEW COMPARISON:  CT abdomen and pelvis January 03, 2020. FINDINGS: There is diffuse stool throughout colon. There is no bowel dilatation or air-fluid level to suggest bowel obstruction. No free air. There are foci of arterial vascular calcification in the left pelvis. IMPRESSION: Diffuse stool throughout colon consistent with constipation. No bowel obstruction or free air demonstrable. Electronically Signed   By: Lowella Grip III M.D.   On: 06/19/2020 13:04   No results for input(s): WBC, HGB, HCT, PLT in the last 72 hours. No results for input(s): NA, K, CL, CO2, GLUCOSE, BUN, CREATININE, CALCIUM in the last 72 hours.  Intake/Output Summary (Last 24 hours) at 06/20/2020 1015 Last data filed at 06/20/2020 0757 Gross per 24 hour  Intake 500 ml  Output 250 ml  Net 250 ml       Physical Exam: Vital Signs Blood pressure 135/69, pulse 70, temperature 98.2 F (36.8 C), resp. rate 18, height 5\' 10"  (1.778 m), weight 129.3 kg, SpO2 96 %. General: Alert and oriented x 3, No apparent distress HEENT: Head is normocephalic, atraumatic, PERRLA, EOMI, sclera anicteric, oral mucosa pink and moist, dentition intact, ext ear canals clear,  Neck: Supple without JVD or lymphadenopathy Heart: Reg rate and rhythm. No murmurs rubs or gallops Chest: CTA bilaterally without wheezes, rales, or rhonchi; no distress Abdomen: Soft, non-tender, non-distended, bowel sounds positive. Extremities: No clubbing, cyanosis, or edema. Pulses are 2+ Skin: Clean and  intact without signs of breakdown Neuro: RUE 2 to 2+/5. RLE 2+/5 Cerebellar exam normal finger to nose to finger as well as heel to shin in left  upper and lower extremities right side testing limited by strength  Musculoskeletal: Full range of motion in all 4 extremities. No joint swelling    Assessment/Plan: 1. Functional deficits secondary to Left pontine infarct  which require 3+ hours per day of interdisciplinary therapy in a comprehensive inpatient rehab setting.  Physiatrist is providing close team supervision and 24 hour management of active medical problems listed below.  Physiatrist and rehab team continue to assess barriers to discharge/monitor patient progress toward functional and medical goals  Care Tool:  Bathing  Bathing activity did not occur:  (N/A) Body parts bathed by patient: Right arm, Chest, Abdomen, Front perineal area, Face, Right upper leg, Left upper leg   Body parts bathed by helper: Left arm, Buttocks, Right lower leg, Left lower leg     Bathing assist Assist Level: Moderate Assistance - Patient 50 - 74%     Upper Body Dressing/Undressing Upper body dressing   What is the patient wearing?: Pull over shirt    Upper body assist Assist Level: Maximal Assistance - Patient 25 - 49%    Lower Body Dressing/Undressing Lower body dressing      What is the patient wearing?: Incontinence brief, Pants     Lower body assist Assist for lower body dressing: Total Assistance - Patient < 25%     Toileting Toileting    Toileting assist Assist for toileting: 2 Helpers Assistive Device Comment: urinal   Transfers Chair/bed  transfer  Transfers assist  Chair/bed transfer activity did not occur: N/A  Chair/bed transfer assist level: Dependent - mechanical lift (stedy)     Locomotion Ambulation   Ambulation assist   Ambulation activity did not occur: N/A          Walk 10 feet activity   Assist  Walk 10 feet activity did not occur: N/A         Walk 50 feet activity   Assist Walk 50 feet with 2 turns activity did not occur: N/A         Walk 150 feet activity   Assist Walk 150 feet activity did not occur: N/A         Walk 10 feet on uneven surface  activity   Assist Walk 10 feet on uneven surfaces activity did not occur: N/A         Wheelchair     Assist Will patient use wheelchair at discharge?: No (Per PT goals )             Wheelchair 50 feet with 2 turns activity    Assist            Wheelchair 150 feet activity     Assist          Blood pressure 135/69, pulse 70, temperature 98.2 F (36.8 C), resp. rate 18, height 5\' 10"  (1.778 m), weight 129.3 kg, SpO2 96 %.  Medical Problem List and Plan: 1.  Right hemiparesis secondary to left pontine infarction.  Follow-up outpatient cardiology services to discuss loop recorder             -patient may shower             -ELOS/Goals: 14-17 days/supervision/min a             Continue CIR PT, OT,SLP  2.  Antithrombotics: -DVT/anticoagulation: SCDs             -antiplatelet therapy: Aspirin 325 mg daily 3. Pain Management: Tylenol as needed. Well controlled.  4. Mood: Provide emotional support             -antipsychotic agents: N/A 5. Neuropsych: This patient is capable of making decisions on his own behalf. 6. Skin/Wound Care: Routine skin checks 7. Fluids/Electrolytes/Nutrition: Routine in and outs.  CMP ordered for tomorrow. 8.  Hypertension.  Norvasc 10 mg daily, Cozaar 100 mg daily.           well controlled.  Vitals:   06/19/20 2027 06/20/20 0619  BP: (!) 145/62 135/69  Pulse: 71 70  Resp: 19 18  Temp: 97.7 F (36.5 C) 98.2 F (36.8 C)  SpO2: 98% 96%  9.  Diabetes mellitus with hyperglycemia.  Hemoglobin A1c 7.5.  SSI.          CBG (last 3)  Recent Labs    06/19/20 1631 06/19/20 2031 06/20/20 0616  GLUCAP 163* 157* 177*  Uncontrolled- will order dietary consult for education.  10.  BPH.  Proscar.                Check PVRs 11.  Hyperlipidemia.  Lipitor--discussed rationale for statin after stroke and in general. He wishes to hold regardless 12.  History of gout.  Zyloprim 300 mg daily.  Monitor for any gout flareups 13.  History of non-Hodgkin's lymphoma status post chemotherapy.  Follow-up Dr Randa Evens as outpatient. 14.  Morbid obesity.  BMI 40.89.  Dietary follow-up 15.  History of rectal outlet bleeding.  Felt  to be related to possible hemorrhoids.  Hemoglobin hematocrit mains stable.  He was to follow-up outpatient gastroenterology services.  Patient had deferred GI work-up while in the hospital. Hgb low nl 16. Constipation which will only exacerbate #15.   -sorbitol, KUB shows stool burden.   -patient reports multiple BMs with laxatives    LOS: 6 days A FACE TO FACE EVALUATION WAS PERFORMED  Adrieanna Boteler P Manny Vitolo 06/20/2020, 10:15 AM

## 2020-06-20 NOTE — Progress Notes (Signed)
Occupational Therapy Session Note  Patient Details  Name: Peter Ryan MRN: [id number] Date of Birth: [birth year]  Today's Date: 06/20/2020 OT Individual Time: 1300-1400 OT Individual Time Calculation (min): 60 min    Short Term Goals: Week 1:  OT Short Term Goal 1 (Week 1): Patient will complete 2/3 parts of UB dressing seated EOB with use of hemi technique. OT Short Term Goal 2 (Week 1): Patient will thread LLE into LB clothing at bed level. OT Short Term Goal 3 (Week 1): Patient will maintain static sitting balance at EOB for 15 minutes with close supervision assist in prep for BADLs. OT Short Term Goal 4 (Week 1): Patient will complete functional transfers with LRAD and Max A +1.  Skilled Therapeutic Interventions/Progress Updates:  Patient met seated in TIS wc in agreement with OT treatment session. Total A for wc transport to therapy gym. Sit to stand with use of Stedy with Min A. From perched position, patient able to come to stand with CGA and hand over hand to maintain position of R hand. Dynamic sitting balance task with bean bag activity. With hand over hand assist, patient able to externally rotate RUE to reach under buttocks to retrieve bean bags. Focus on wrist and digit extension to release bean bag. Reciprocal scooting anteriorly in prep for functional tranfers. Sit to stand from mat table to bariatric RW and Max A to Mod A with cues for hand/foot placement and R knee block x3 trials. Wc transport back to room for toileting task. Use of Stedy for transfer to bariatric [facility] over standard commode in bathroom and Total A for hygiene/clothing management with second person on standby. Smear of BM present in brief with patient unaware. Return to supine with +2 assist and trunk and BLE. Session concluded with patient lying supine in bed with call bell within reach, bed alarm activated, and all needs met. Wife present at bedside.   Therapy Documentation Precautions:   Precautions Precautions: Fall, Other (comment) Precaution Comments: R hemiparesis Restrictions Weight Bearing Restrictions: No Other Position/Activity Restrictions: protect weak RUE General:    Therapy/Group: Individual Therapy  Peter Ryan 06/20/2020, 12:55 PM

## 2020-06-20 NOTE — Progress Notes (Signed)
Speech Language Pathology Daily Session Note  Patient Details  Name: Peter Ryan MRN: [id number] Date of Birth: 15-Jul-1951  Today's Date: 06/20/2020 SLP Individual Time: 0900-0959 SLP Individual Time Calculation (min): 59 min  Short Term Goals: Week 1: SLP Short Term Goal 1 (Week 1): Pt will consume regular solid textures and thin liquids with minimal overt s/sx aspiration and will use compensatory swallow strategies to clear right buccal pocketing Mod I. SLP Short Term Goal 2 (Week 1): Pt will use 90% intelligible speech in conversation with Supervision A verbal cues for use of compensatory strategies. SLP Short Term Goal 3 (Week 1): Pt will recall 2 speech intelligibility strategies Mod A verbal cues for recall/use of aids or strategies. SLP Short Term Goal 4 (Week 1): Pt will recall new/daily information with Mod A verbal/visual cues for use of aids/strategies. SLP Short Term Goal 5 (Week 1): Pt will demonstrate ability to problem solve mildly complex to complex tasks with Min A verbal/visual cues. SLP Short Term Goal 6 (Week 1): Pt will anticipate 2 ADLs he will need assistance with at discharge with Min A verbal/visual cues.  Skilled Therapeutic Interventions:Skilled St services focused on education and cognitive skills. SLP facilitated mildly complex to comples problems solving and error awareness skills in BID pill organizer task an account balancing task. Pt required supervision A verbal cues fading to mod I for error awareness pertaining to x2 per day and supervision A verbal cues for errors while using calculator in account balancing task. SLP began to assess cognitive skills with CLQT to target higher level cognitive function. Pt's wife entered room and was present for administeration of three subsections. Pt scored below normal limits in the subsections symbol cancellation task, symbol trial task and maze tasks. Pt and pt's wife support performance is near cognitive baseline. SLP  provided education pertaining to noted deficits in higher level problem solving and attention, as well as needed suppport from pt's wife in the areas mentioned above. Pt's wife agreed. SLP downgraded ST treatment time from x5 a week to x3 a week and will continue reassessment in upcoming session piror to decision of discharge. Pt was left in room with wife. SLP recommends to continue skilled ST services.       Pain Pain Assessment Pain Score: 0-No pain  Therapy/Group: Individual Therapy  [name]  [facility] 06/20/2020, 4:49 PM

## 2020-06-20 NOTE — Progress Notes (Signed)
Physical Therapy Session Note  Patient Details  Name: Peter Ryan MRN: [id number] Date of Birth: Jul 21, 1951  Today's Date: 06/20/2020 PT Individual Time: 1050-1205 PT Individual Time Calculation (min): 75 min   Short Term Goals: Week 1:  PT Short Term Goal 1 (Week 1): Pt will perform supine<>sit with max assist of 1 PT Short Term Goal 2 (Week 1): Pt will perform sit<>stands with +2 max assist using LRAD (not lift equipment) PT Short Term Goal 3 (Week 1): Pt will perform bed<>chair transfers with +2 max assist without use of lift equipment PT Short Term Goal 4 (Week 1): Pt will initiate gait training  Skilled Therapeutic Interventions/Progress Updates: Pt presented in bed agreeable to therapy. Pt denies pain during session. Pt noted to not have shorts/pants on. PTA threaded pants for time management and pt performed rolling L minA and R modA to allow PTA to pull pants over hips. Pt then performed supine to sit via log roll technique to R with modA. Pt required cues to sequencing and truncal support. Upon sitting EOB pt use of foot board for support as bed noted to be malfunctioning and would automatically drop to reverse Trendelenburg. For pt safety, pt transferred to [facility] via Stedy. Pt required modA to perform STS from EOB (nsg notified and bed changed during session). Pt transported to rehab gym and performed squat pivot to mat with maxA x 2. Pt required max cues to increase anterior lean and push through BLE to facilitate scoot. Once at mat pt participated in lateral leans for forced use of RUE as well as reaching behind to grab playing cards to promote wt shifting through hips. Pt then set up for [facility] and pt participated in STS x5 with heavy duty RW and mat elevated. Pt progressed from modA x 2 to modA x 1 with repetitions. Pt required cues for increased R knee extension, improving anterior translation of hips to promote erect posture and correct hand placement. Pt was able to stand with RW  and perform lateral wt shifting while remaining standing for up to 30 sec. Pt with noted fatigue after stands and required maxA x 2 for squat pivot back to TIS. Pt transported back to room at end of session and agreeable to remain in TIS for lunch with OT session following. Pt left in chair with belt alarm on, call bell within reach and needs met.      Therapy Documentation Precautions:  Precautions Precautions: Fall, Other (comment) Precaution Comments: R hemiparesis Restrictions Weight Bearing Restrictions: No Other Position/Activity Restrictions: protect weak RUE General:   Vital Signs: Therapy Vitals Temp: (!) 97.3 F (36.3 C) Pulse Rate: 84 Resp: 18 BP: (!) 160/73 Patient Position (if appropriate): Sitting Oxygen Therapy SpO2: 97 % O2 Device: Room Air Pain:     Therapy/Group: Individual Therapy  Lynze Reddy  Malea Swilling, PTA  06/20/2020, 4:17 PM

## 2020-06-21 ENCOUNTER — Inpatient Hospital Stay (HOSPITAL_COMMUNITY): Payer: Medicare Other | Admitting: Occupational Therapy

## 2020-06-21 ENCOUNTER — Encounter (HOSPITAL_COMMUNITY): Payer: Medicare Other | Admitting: Psychology

## 2020-06-21 ENCOUNTER — Inpatient Hospital Stay (HOSPITAL_COMMUNITY): Payer: Medicare Other

## 2020-06-21 ENCOUNTER — Telehealth: Payer: Medicare Other | Admitting: Cardiology

## 2020-06-21 ENCOUNTER — Ambulatory Visit: Payer: Medicare Other

## 2020-06-21 ENCOUNTER — Inpatient Hospital Stay (HOSPITAL_COMMUNITY): Payer: Medicare Other | Admitting: Physical Therapy

## 2020-06-21 LAB — CBC
HCT: 40.1 % (ref 39.0–52.0)
Hemoglobin: 13 g/dL (ref 13.0–17.0)
MCH: 28.4 pg (ref 26.0–34.0)
MCHC: 32.4 g/dL (ref 30.0–36.0)
MCV: 87.7 fL (ref 80.0–100.0)
Platelets: 246 10*3/uL (ref 150–400)
RBC: 4.57 MIL/uL (ref 4.22–5.81)
RDW: 13.9 % (ref 11.5–15.5)
WBC: 7.4 10*3/uL (ref 4.0–10.5)
nRBC: 0 % (ref 0.0–0.2)

## 2020-06-21 LAB — GLUCOSE, CAPILLARY
Glucose-Capillary: 160 mg/dL — ABNORMAL HIGH (ref 70–99)
Glucose-Capillary: 169 mg/dL — ABNORMAL HIGH (ref 70–99)
Glucose-Capillary: 147 mg/dL — ABNORMAL HIGH (ref 70–99)
Glucose-Capillary: 157 mg/dL — ABNORMAL HIGH (ref 70–99)

## 2020-06-21 NOTE — Progress Notes (Signed)
Occupational Therapy Session Note  Patient Details  Name: Peter Ryan MRN: [id number] Date of Birth: 1951/08/14  Today's Date: 06/21/2020 OT Individual Time: 7846-9629 OT Individual Time Calculation (min): 41 min   OT Individual Time: 1431-1500 OT Individual Time Calculation (min): 29 min    Short Term Goals: Week 1:  OT Short Term Goal 1 (Week 1): Patient will complete 2/3 parts of UB dressing seated EOB with use of hemi technique. OT Short Term Goal 2 (Week 1): Patient will thread LLE into LB clothing at bed level. OT Short Term Goal 3 (Week 1): Patient will maintain static sitting balance at EOB for 15 minutes with close supervision assist in prep for BADLs. OT Short Term Goal 4 (Week 1): Patient will complete functional transfers with LRAD and Max A +1.  Skilled Therapeutic Interventions/Progress Updates:  Session 1: Patient reporting 0/10 pain at rest and with activity, only minor discomfort in buttocks 2/2 sitting in TIS wc. Total A for wc mobility to therapy gym. Patient completed stand-pivot transfer TIS wc to <> mat table with Mod A and cues for hand/foot placement and technique. Focus on RUE weightbearing to facilitate wrist/digit extension and reciprocal scooting and hip dissociation in prep for functional transfers and BADLs. Patient initially hesitant to scoot noting fear of falling but with time, progressed to completing multiple lateral, anterior/posterior scoots with CGA to Min A. Return to supine with Mod A and BLE and cues for technique. Session concluded with patient lying supine in bed with call bell within reach and bed alarm activated.   Session 2: Afternoon session with focus on RUE NMR. Patient given green foam block to facilitate gross grasp in prep for grooming/BADL tasks. Patient also given styrofoam cup with foam blocks with patient able to demonstrate gross raking grasp without assist and tip pinch/3-jaw chuck with hand over hand assist. Patient repositioned in  supine with +1 assist for supine scoot toward [facility] with use of RUE on headboard. Session concluded with call bell within reach and bed alarm activated.     Therapy Documentation Precautions:  Precautions Precautions: Fall, Other (comment) Precaution Comments: R hemiparesis Restrictions Weight Bearing Restrictions: No Other Position/Activity Restrictions: protect weak RUE General:    Therapy/Group: Individual Therapy  Teruko Joswick R Howerton-Davis 06/21/2020, 7:30 AM

## 2020-06-21 NOTE — Patient Care Conference (Signed)
Inpatient RehabilitationTeam Conference and Plan of Care Update Date: 06/21/2020   Time: 10:12 AM    Patient Name: Peter Ryan      Medical Record Number: [id number]  Date of Birth: [birth year] Sex: Male         Room/Bed: 4W23C/4W23C-01 Payor Info: Payor: MEDICARE / Plan: MEDICARE PART A AND B / Product Type: *No Product type* /    Admit Date/Time:  06/14/2020  1:20 PM  Primary Diagnosis:  Left pontine cerebrovascular accident [facility] [place])  Hospital Problems: Principal Problem:   Left pontine cerebrovascular accident [facility])    Expected Discharge Date: Expected Discharge Date: 07/11/20  Team Members Present: Physician leading conference: Dr. Alysia Penna Care Coodinator Present: Dorien Chihuahua, RN, BSN, CRRN;Christina Easton, [place] Nurse Present: Rayne Du, LPN PT Present: Barrie Folk, PT OT Present: Turner Daniels, OT SLP Present: Charolett Bumpers, SLP PPS Coordinator present : Gunnar Fusi, SLP     Current Status/Progress Goal Weekly Team Focus  Bowel/Bladder   continent of bladder, cont/incontinent of bowel; LBM 9/21  become continent of B&B  assess q shift/prn   Swallow/Nutrition/ Hydration   Supervision, has refused to trials with SLP so haven't seen much  Mod I  swallow strategies for oral clearance   ADL's   Mod A bathing seated in shower, Max A UB dressing, Total A LB dressing, Dependent toilet + shower transfers using Stedy lift  Min A overall  Rt NMR, sitting balance/postural control, functional transfers, activity tolerance, sit<stands   Mobility   modA bed mobility, maxA x2 squar pivot transfers, maxA STS from elevated height, maxA x 2 from lower height, maxA gait at wall rail 18ft  minA overall  transfers, standing tolerance, pre-gait/gait, balance d/c planning   Communication   Supervision A, conversation  Mod I  over articulate, slow rate and incraese vocal intensity   Safety/Cognition/ Behavioral Observations  Supervision A, approaching  baseline per pt/pt's wife, downgraded times per week and will likely d/c soon  Mod I  semi-complex to complex problem solving, error/anticipatory awareness, and recall of novel information   Pain   no c/o pain  remain pain free  assess q shift/prn   Skin   skin intact  remain free of further breakdown/infection  assess q shift/prn     Discharge Planning:  Goal to d/c Min A, spouse avaliable 24/7. 3 steps to enter home (R side railings)   Team Discussion: Right hemiparesis with poor insight and easily distracted, atypical with pons stroke. Family reports cognitive status is baseline. CBGs well controlled. Continent of bowel and bladder.  Good motivation noted. 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 Ambulates with RW without use of a harness with mod assist. Patient on target to meet rehab goals: yes, min assist overall  *See Care Plan and progress notes for long and short-term goals.   Revisions to Treatment Plan:   SLP changed to three times/week treatments. Work on anticipatory awareness and attention, right side pocketing Teaching Needs: Need for awareness cues, transfers, toileting, medications, etc.  Current Barriers to Discharge: None  Possible Resolutions to Barriers: Family education     Medical Summary Current Status: constipation improved, no further rectal bleeding, still with severe trunkal an dRight side limb weakness  Barriers to Discharge: Medical stability   Possible Resolutions to [employer] Focus: Cnt rehab, cont laxative, monitor CBC   Continued Need for Acute Rehabilitation Level of Care: The patient requires daily medical management by a physician with specialized training in physical medicine and rehabilitation for  the following reasons: Direction of a multidisciplinary physical rehabilitation program to maximize functional independence : Yes Medical management of patient stability for increased activity during participation in an intensive  rehabilitation regime.: Yes Analysis of laboratory values and/or radiology reports with any subsequent need for medication adjustment and/or medical intervention. : Yes   I attest that I was present, lead the team conference, and concur with the assessment and plan of the team.   Dorien Chihuahua B 06/21/2020, 2:44 PM

## 2020-06-21 NOTE — Progress Notes (Signed)
Physical Therapy Session Note  Patient Details  Name: Peter Ryan MRN: [id number] Date of Birth: 1950-11-01  Today's Date: 06/21/2020 PT Individual Time: 0800-0900 1620-1700 PT Individual Time Calculation (min): 60 min  And 40 min   Short Term Goals: Week 1:  PT Short Term Goal 1 (Week 1): Pt will perform supine<>sit with max assist of 1 PT Short Term Goal 2 (Week 1): Pt will perform sit<>stands with +2 max assist using LRAD (not lift equipment) PT Short Term Goal 3 (Week 1): Pt will perform bed<>chair transfers with +2 max assist without use of lift equipment PT Short Term Goal 4 (Week 1): Pt will initiate gait training  Skilled Therapeutic Interventions/Progress Updates:   Pt received supine in bed and agreeable to PT. Supine>sit transfer with mod assist and moderate cues for sequencing and improved trunkal activation. Pt reports need for BM.   Stedy transfer to toilet. Able to void bowel and bladder on toilet. Clothing management and pericare performed by PT for safety and dtime management in stedy.   Lower body dressing in stedy with max assist for time management as well as safety with BUE supported on stedy rail.   Pregait weight shifting R and L with BUE support on RW 2 x 5 Bil with mi assist from PT to prevent posterior LOB initially. Gait training with RW 3 x89ft mod A +2 for WC follow and safety, cues for attention to visual feedback from mirror, improved knee extension on the R, and increased step width to prevent lateral lean to the R. Pt noted to remain in RE at the R hip.  Prolonged rest break between bouts for fatigue recovery.     Session . 2  Pt received supine in bed and agreeable to PT. Supine>sit transfer with mod assist and moderate cues for sequencing and proper use of BUE to push into siting.   Toilet transfer in stedy for small BM. Pt able to void (+). All clothing management and peri care performed by PT. Stedy transfer to [facility] with min assist. From PT>    Gait training in room to transfer to EOB with RW and mod assist. Max cues for midline orientation, Attention to the RLE and improved sequencing to prevent LOB to the R.   Weight shifting. Stepping to target, sit<>stand from EOB with BUR support on RW. Each completed x 10 BLE with min-mod assist from PT for safety and constant cues for midline awareness and attention to the RLE to improve terminal knee extension.   Sit>supine completed with mod assist for BLE management, and left supine in bed with call bell in reach and all needs met.       Therapy Documentation Precautions:  Precautions Precautions: Fall, Other (comment) Precaution Comments: R hemiparesis Restrictions Weight Bearing Restrictions: No Other Position/Activity Restrictions: protect weak RUE General:   Vital Signs: Therapy Vitals Temp: 97.7 F (36.5 C) Temp Source: Oral Pulse Rate: 68 Resp: 18 BP: (!) 147/66 Patient Position (if appropriate): Lying Oxygen Therapy SpO2: 96 % O2 Device: Room Air Pain: Pain Assessment Pain Scale: 0-10 Pain Score: 0-No pain    Therapy/Group: Individual Therapy  Lorie Phenix 06/21/2020, 9:03 AM

## 2020-06-21 NOTE — Progress Notes (Addendum)
[facility] PHYSICAL MEDICINE & REHABILITATION PROGRESS NOTE   Subjective/Complaints: No issues, still needing tilt in space  ROS: Patient denies CP, SOB N/V/D Objective:   DG Abd 1 View  Result Date: 06/19/2020 CLINICAL DATA:  Constipation EXAM: ABDOMEN - 1 VIEW COMPARISON:  CT abdomen and pelvis January 03, 2020. FINDINGS: There is diffuse stool throughout colon. There is no bowel dilatation or air-fluid level to suggest bowel obstruction. No free air. There are foci of arterial vascular calcification in the left pelvis. IMPRESSION: Diffuse stool throughout colon consistent with constipation. No bowel obstruction or free air demonstrable. Electronically Signed   By: Lowella Grip III M.D.   On: 06/19/2020 13:04   No results for input(s): WBC, HGB, HCT, PLT in the last 72 hours. No results for input(s): NA, K, CL, CO2, GLUCOSE, BUN, CREATININE, CALCIUM in the last 72 hours.  Intake/Output Summary (Last 24 hours) at 06/21/2020 0956 Last data filed at 06/21/2020 0547 Gross per 24 hour  Intake 240 ml  Output 500 ml  Net -260 ml       Physical Exam: Vital Signs Blood pressure (!) 147/66, pulse 68, temperature 97.7 F (36.5 C), temperature source Oral, resp. rate 18, height 5\' 10"  (1.778 m), weight 129.3 kg, SpO2 96 %.  General: No acute distress Mood and affect are appropriate Heart: Regular rate and rhythm no rubs murmurs or extra sounds Lungs: Clear to auscultation, breathing unlabored, no rales or wheezes Abdomen: Positive bowel sounds, soft nontender to palpation, nondistended Extremities: No clubbing, cyanosis, or edema Skin: No evidence of breakdown, no evidence of rash   Cerebellar exam normal finger to nose to finger as well as heel to shin in left  upper and lower extremities right side testing limited by strength  Musculoskeletal: Full range of motion in all 4 extremities. No joint swelling    Assessment/Plan: 1. Functional deficits secondary to Left pontine infarct   which require 3+ hours per day of interdisciplinary therapy in a comprehensive inpatient rehab setting.  Physiatrist is providing close team supervision and 24 hour management of active medical problems listed below.  Physiatrist and rehab team continue to assess barriers to discharge/monitor patient progress toward functional and medical goals  Care Tool:  Bathing  Bathing activity did not occur:  (N/A) Body parts bathed by patient: Right arm, Chest, Abdomen, Front perineal area, Face, Right upper leg, Left upper leg   Body parts bathed by helper: Left arm, Buttocks, Right lower leg, Left lower leg     Bathing assist Assist Level: Moderate Assistance - Patient 50 - 74%     Upper Body Dressing/Undressing Upper body dressing   What is the patient wearing?: Pull over shirt    Upper body assist Assist Level: Maximal Assistance - Patient 25 - 49%    Lower Body Dressing/Undressing Lower body dressing      What is the patient wearing?: Incontinence brief, Pants     Lower body assist Assist for lower body dressing: Total Assistance - Patient < 25%     Chartered loss adjuster assist Assist for toileting: 2 Helpers Assistive Device Comment: Bariatric BSC   Transfers Chair/bed transfer  Transfers assist  Chair/bed transfer activity did not occur: N/A  Chair/bed transfer assist level: Dependent - mechanical lift (stedy)     Locomotion Ambulation   Ambulation assist   Ambulation activity did not occur: N/A          Walk 10 feet activity   Assist  Walk  10 feet activity did not occur: N/A        Walk 50 feet activity   Assist Walk 50 feet with 2 turns activity did not occur: N/A         Walk 150 feet activity   Assist Walk 150 feet activity did not occur: N/A         Walk 10 feet on uneven surface  activity   Assist Walk 10 feet on uneven surfaces activity did not occur: N/A         Wheelchair     Assist Will patient  use wheelchair at discharge?: No (Per PT goals )             Wheelchair 50 feet with 2 turns activity    Assist            Wheelchair 150 feet activity     Assist          Blood pressure (!) 147/66, pulse 68, temperature 97.7 F (36.5 C), temperature source Oral, resp. rate 18, height 5\' 10"  (1.778 m), weight 129.3 kg, SpO2 96 %.  Medical Problem List and Plan: 1.  Right hemiparesis secondary to left pontine infarction.  Follow-up outpatient cardiology services to discuss loop recorder             -patient may shower             -ELOS/Goals: 14-17 days/supervision/min a             Continue CIR PT, OT,SLP  2.  Antithrombotics: -DVT/anticoagulation: SCDs             -antiplatelet therapy: Aspirin 325 mg daily 3. Pain Management: Tylenol as needed. Well controlled.  4. Mood: Provide emotional support             -antipsychotic agents: N/A 5. Neuropsych: This patient is capable of making decisions on his own behalf. 6. Skin/Wound Care: Routine skin checks 7. Fluids/Electrolytes/Nutrition: Routine in and outs.  CMP ordered for tomorrow. 8.  Hypertension.  Norvasc 10 mg daily, Cozaar 100 mg daily.           well controlled.  Vitals:   06/20/20 2052 06/21/20 0604  BP: 134/80 (!) 147/66  Pulse: 69 68  Resp: 18 18  Temp: 98.3 F (36.8 C) 97.7 F (36.5 C)  SpO2: 98% 96%  9.  Diabetes mellitus with hyperglycemia.  Hemoglobin A1c 7.5.  SSI.          CBG (last 3)  Recent Labs    06/20/20 1625 06/20/20 2053 06/21/20 0606  GLUCAP 143* 156* 160*  good control   10.  BPH.  Proscar.               Check PVRs 11.  Hyperlipidemia.  Lipitor--discussed rationale for statin after stroke and in general. He wishes to hold regardless 12.  History of gout.  Zyloprim 300 mg daily.  Monitor for any gout flareups 13.  History of non-Hodgkin's lymphoma status post chemotherapy.  Follow-up Dr Randa Evens as outpatient. 14.  Morbid obesity.  BMI 40.89.  Dietary follow-up 15.   History of rectal outlet bleeding.  Felt to be related to possible hemorrhoids.  Hemoglobin hematocrit mains stable.  He was to follow-up outpatient gastroenterology services.  Patient had deferred GI work-up while in the hospital. Hgb low nl 16. Constipation improved with laxatives, no gross bleeding , repeat CBC    LOS: 7 days A FACE TO FACE EVALUATION WAS PERFORMED  Mitzi Hansen  E Breyton Vanscyoc 06/21/2020, 9:56 AM

## 2020-06-21 NOTE — Progress Notes (Signed)
Speech Language Pathology Daily Session Note  Patient Details  Name: Peter Ryan MRN: [id number] Date of Birth: 1951-08-09  Today's Date: 06/21/2020 SLP Individual Time: 1300-1355 SLP Individual Time Calculation (min): 55 min  Short Term Goals: Week 1: SLP Short Term Goal 1 (Week 1): Pt will consume regular solid textures and thin liquids with minimal overt s/sx aspiration and will use compensatory swallow strategies to clear right buccal pocketing Mod I. SLP Short Term Goal 2 (Week 1): Pt will use 90% intelligible speech in conversation with Supervision A verbal cues for use of compensatory strategies. SLP Short Term Goal 3 (Week 1): Pt will recall 2 speech intelligibility strategies Mod A verbal cues for recall/use of aids or strategies. SLP Short Term Goal 4 (Week 1): Pt will recall new/daily information with Mod A verbal/visual cues for use of aids/strategies. SLP Short Term Goal 5 (Week 1): Pt will demonstrate ability to problem solve mildly complex to complex tasks with Min A verbal/visual cues. SLP Short Term Goal 6 (Week 1): Pt will anticipate 2 ADLs he will need assistance with at discharge with Min A verbal/visual cues.  Skilled Therapeutic Interventions: Skilled therapeutic intervention focused on cognition and speech intelligibility. SLP administered additional subtests of CLQT to assess attention and processing speed. Pt named 5 items in one minute with first generative naming task and 10 items in category for second generative naming task. Pt reported he feels cognition is at or near baseline. SLP informed pt of decrease in frequency to 3x/week and pt agreeable with plan. Speech intelligibility task completed with min A for use of overarticulation with multisyllabic words. Pt left seated upright in bed with call bell within reach and bed alarm set. Cont with therapy per plan of care.      Pain Pain Assessment Pain Scale: Faces Faces Pain Scale: No hurt  Therapy/Group:  Individual Therapy  Peter Ryan Peter Ryan 06/21/2020, 2:01 PM

## 2020-06-21 NOTE — Progress Notes (Signed)
Team Conference Report to Patient/Family  Team Conference discussion was reviewed with the patient and caregiver, including goals, any changes in plan of care and target discharge date.  Patient and caregiver express understanding and are in agreement.  The patient has a target discharge date of 07/11/20.  Dyanne Iha 06/21/2020, 1:13 PM

## 2020-06-22 ENCOUNTER — Inpatient Hospital Stay (HOSPITAL_COMMUNITY): Payer: Medicare Other | Admitting: Physical Therapy

## 2020-06-22 ENCOUNTER — Inpatient Hospital Stay (HOSPITAL_COMMUNITY): Payer: Medicare Other

## 2020-06-22 ENCOUNTER — Inpatient Hospital Stay (HOSPITAL_COMMUNITY): Payer: Medicare Other | Admitting: Occupational Therapy

## 2020-06-22 LAB — GLUCOSE, CAPILLARY
Glucose-Capillary: 160 mg/dL — ABNORMAL HIGH (ref 70–99)
Glucose-Capillary: 184 mg/dL — ABNORMAL HIGH (ref 70–99)
Glucose-Capillary: 174 mg/dL — ABNORMAL HIGH (ref 70–99)
Glucose-Capillary: 149 mg/dL — ABNORMAL HIGH (ref 70–99)

## 2020-06-22 NOTE — Progress Notes (Signed)
Speech Language Pathology Weekly Progress and Session Note  Patient Details  Name: Peter Ryan MRN: [id number] Date of Birth: January 03, 1951  Beginning of progress report period: June 15, 2020 End of progress report period: June 22, 2020  Today's Date: 06/22/2020 SLP Individual Time: 1400-1455 SLP Individual Time Calculation (min): 55 min  Short Term Goals: Week 1: SLP Short Term Goal 1 (Week 1): Pt will consume regular solid textures and thin liquids with minimal overt s/sx aspiration and will use compensatory swallow strategies to clear right buccal pocketing Mod I. SLP Short Term Goal 1 - Progress (Week 1): Met SLP Short Term Goal 2 (Week 1): Pt will use 90% intelligible speech in conversation with Supervision A verbal cues for use of compensatory strategies. SLP Short Term Goal 2 - Progress (Week 1): Progressing toward goal SLP Short Term Goal 3 (Week 1): Pt will recall 2 speech intelligibility strategies Mod A verbal cues for recall/use of aids or strategies. SLP Short Term Goal 3 - Progress (Week 1): Met SLP Short Term Goal 4 (Week 1): Pt will recall new/daily information with Mod A verbal/visual cues for use of aids/strategies. SLP Short Term Goal 4 - Progress (Week 1): Met SLP Short Term Goal 5 (Week 1): Pt will demonstrate ability to problem solve mildly complex to complex tasks with Min A verbal/visual cues. SLP Short Term Goal 5 - Progress (Week 1): Partly met SLP Short Term Goal 6 (Week 1): Pt will anticipate 2 ADLs he will need assistance with at discharge with Min A verbal/visual cues. SLP Short Term Goal 6 - Progress (Week 1): Progressing toward goal    New Short Term Goals: Week 2: SLP Short Term Goal 1 (Week 2): Pt will use 90% intelligible speech in conversation with Supervision A verbal cues for use of compensatory strategies. SLP Short Term Goal 2 (Week 2): Pt will demonstrate ability to problem solve mildly complex to complex tasks with Min A verbal/visual  cues. SLP Short Term Goal 3 (Week 2): Pt will anticipate 2 ADLs he will need assistance with at discharge with Min A verbal/visual cues.  Weekly Progress Updates: Pt has met 3/6 goals and party met an additional goal due to improved tolerance with diet, improved problem solving skills,  And improved memory. Currently pt continues to require mod A for semi complex problem solving tasks. Pt requires mod A to increase awareness of higher level cognitive deficits. Wife reports current cognitive function is baseline. Pt will benefit from continued skilled SLP tx to increase awareness, and semi complex problem solving skills. In order to maximize his functional  Independence.      Intensity: Minumum of 1-2 x/day, 30 to 90 minutes Frequency: 1 to 3 out of 7 days Duration/Length of Stay: 3 weeks Treatment/Interventions: Cognitive remediation/compensation;Cueing hierarchy;Dysphagia/aspiration precaution training;Functional tasks;Patient/family education;Internal/external aids;Speech/Language facilitation   Daily Session  Skilled Therapeutic Interventions: Skilled SLP intervention focused on cognition and speech intelligiblity. Pt read 10-12 word sentences with min a for use of overarticulation. Pt completed money word problems with mod a and using calculator with more complex calculations. Pt required mod A for completing deductive reasoning scheduling task accurately and encouragement to continue task. Decreased interest noted in speech therapy session. Pt left seated upright in bed with bed alarm on and wife present. Cont with therapy per pan of care.    General    Pain Pain Assessment Pain Scale: Faces Faces Pain Scale: No hurt  Therapy/Group: Individual Therapy  Darrol Poke Tanielle Emigh 06/22/2020, 5:38 PM

## 2020-06-22 NOTE — Progress Notes (Signed)
[place] PHYSICAL MEDICINE & REHABILITATION PROGRESS NOTE   Subjective/Complaints: Discussed DC date with pt   ROS: Patient denies CP, SOB N/V/D Objective:   No results found. Recent Labs    06/21/20 1024  WBC 7.4  HGB 13.0  HCT 40.1  PLT 246   No results for input(s): NA, K, CL, CO2, GLUCOSE, BUN, CREATININE, CALCIUM in the last 72 hours.  Intake/Output Summary (Last 24 hours) at 06/22/2020 0727 Last data filed at 06/22/2020 9518 Gross per 24 hour  Intake 600 ml  Output 600 ml  Net 0 ml       Physical Exam: Vital Signs Blood pressure (!) 147/67, pulse 67, temperature 98 F (36.7 C), resp. rate 17, height 5\' 10"  (1.778 m), weight 129.3 kg, SpO2 98 %.   General: No acute distress Mood and affect are appropriate Heart: Regular rate and rhythm no rubs murmurs or extra sounds Lungs: Clear to auscultation, breathing unlabored, no rales or wheezes Abdomen: Positive bowel sounds, soft nontender to palpation, nondistended Extremities: No clubbing, cyanosis, or edema Skin: No evidence of breakdown, no evidence of rash Cerebellar exam normal finger to nose to finger as well as heel to shin in left  upper and lower extremities right side testing limited by strength  Motor 3- RUE and RLE 5/5 on left  Musculoskeletal: Full range of motion in all 4 extremities. No joint swelling    Assessment/Plan: 1. Functional deficits secondary to Left pontine infarct  which require 3+ hours per day of interdisciplinary therapy in a comprehensive inpatient rehab setting.  Physiatrist is providing close team supervision and 24 hour management of active medical problems listed below.  Physiatrist and rehab team continue to assess barriers to discharge/monitor patient progress toward functional and medical goals  Care Tool:  Bathing  Bathing activity did not occur:  (N/A) Body parts bathed by patient: Right arm, Chest, Abdomen, Front perineal area, Face, Right upper leg, Left upper leg    Body parts bathed by helper: Left arm, Buttocks, Right lower leg, Left lower leg     Bathing assist Assist Level: Moderate Assistance - Patient 50 - 74%     Upper Body Dressing/Undressing Upper body dressing   What is the patient wearing?: Pull over shirt    Upper body assist Assist Level: Maximal Assistance - Patient 25 - 49%    Lower Body Dressing/Undressing Lower body dressing      What is the patient wearing?: Incontinence brief, Pants     Lower body assist Assist for lower body dressing: Total Assistance - Patient < 25%     Chartered loss adjuster assist Assist for toileting: 2 Helpers Assistive Device Comment: Bariatric BSC   Transfers Chair/bed transfer  Transfers assist  Chair/bed transfer activity did not occur: N/A  Chair/bed transfer assist level: Dependent - mechanical lift (stedy)     Locomotion Ambulation   Ambulation assist   Ambulation activity did not occur: N/A          Walk 10 feet activity   Assist  Walk 10 feet activity did not occur: N/A        Walk 50 feet activity   Assist Walk 50 feet with 2 turns activity did not occur: N/A         Walk 150 feet activity   Assist Walk 150 feet activity did not occur: N/A         Walk 10 feet on uneven surface  activity   Assist Walk 10  feet on uneven surfaces activity did not occur: N/A         Wheelchair     Assist Will patient use wheelchair at discharge?: No (Per PT goals )             Wheelchair 50 feet with 2 turns activity    Assist            Wheelchair 150 feet activity     Assist          Blood pressure (!) 147/67, pulse 67, temperature 98 F (36.7 C), resp. rate 17, height 5\' 10"  (1.778 m), weight 129.3 kg, SpO2 98 %.  Medical Problem List and Plan: 1.  Right hemiparesis secondary to left pontine infarction.  Follow-up outpatient cardiology services to discuss loop recorder             -patient may shower              -ELOS/Goals: 14-17 days/supervision/min a             Continue CIR PT, OT,SLP  2.  Antithrombotics: -DVT/anticoagulation: SCDs             -antiplatelet therapy: Aspirin 325 mg daily 3. Pain Management: Tylenol as needed. Well controlled.  4. Mood: Provide emotional support             -antipsychotic agents: N/A 5. Neuropsych: This patient is capable of making decisions on his own behalf. 6. Skin/Wound Care: Routine skin checks 7. Fluids/Electrolytes/Nutrition: Routine in and outs.  CMP ordered for tomorrow. 8.  Hypertension.  Norvasc 10 mg daily, Cozaar 100 mg daily.           well controlled.  Vitals:   06/21/20 1951 06/22/20 0606  BP: (!) 155/89 (!) 147/67  Pulse: 70 67  Resp: 18 17  Temp: 98.7 F (37.1 C) 98 F (36.7 C)  SpO2: 96% 98%  9.  Diabetes mellitus with hyperglycemia.  Hemoglobin A1c 7.5.  SSI.          CBG (last 3)  Recent Labs    06/21/20 1701 06/21/20 2135 06/22/20 0606  GLUCAP 147* 157* 160*  good control 9/23   10.  BPH.  Proscar.               Check PVRs 11.  Hyperlipidemia.  Lipitor--discussed rationale for statin after stroke and in general. He wishes to hold regardless 12.  History of gout.  Zyloprim 300 mg daily.  Monitor for any gout flareups 13.  History of non-Hodgkin's lymphoma status post chemotherapy.  Follow-up Dr Randa Evens as outpatient. 14.  Morbid obesity.  BMI 40.89.  Dietary follow-up 15.  History of rectal outlet bleeding.  Felt to be related to possible hemorrhoids.  Hemoglobin hematocrit mains stable.  He was to follow-up outpatient gastroenterology services.  Patient had deferred GI work-up while in the hospital. Hgb low nl 16. Constipation improved with laxatives, no gross bleeding , repeat CBC    LOS: 8 days A FACE TO FACE EVALUATION WAS PERFORMED  Charlett Blake 06/22/2020, 7:27 AM

## 2020-06-22 NOTE — Progress Notes (Signed)
Occupational Therapy Session Note  Patient Details  Name: Peter Ryan MRN: [id number] Date of Birth: 09/01/1951  Today's Date: 06/22/2020 OT Individual Time: 0569-7948 OT Individual Time Calculation (min): 70 min    Short Term Goals: Week 1:  OT Short Term Goal 1 (Week 1): Patient will complete 2/3 parts of UB dressing seated EOB with use of hemi technique. OT Short Term Goal 2 (Week 1): Patient will thread LLE into LB clothing at bed level. OT Short Term Goal 3 (Week 1): Patient will maintain static sitting balance at EOB for 15 minutes with close supervision assist in prep for BADLs. OT Short Term Goal 4 (Week 1): Patient will complete functional transfers with LRAD and Max A +1.  Skilled Therapeutic Interventions/Progress Updates:  Patient met seated in wc in agreement with OT treatment session. Seated at sink level, patient completed oral hygiene, face washing, and shaving task with Mod A to incorporate RUE. Education on utilizing RUE in as many tasks as possible to improve strength and coordination with patient expressing verbal understanding. Patient very motivated to return home expressing concern about not being able to if more progress isn't made. OT provided emotional support and education on progress already made since initial evaluation. Initiation of wc education on forward/right/left propulsion. Patient requiring multimodal cues for use of RUE but able to self-propel short distance forward. Blocked practice for stand-pivot transfers in prep for toileting tasks with Mod to Max A and multimodal cues for R foot placement and technique. Patient with fear of falling causing him to sit prematurely. Education provided on technique. Static standing tolerance with incorporation of functional activity and weight bearing through RUE. Wc transport with total A for time management and energy conservation. Stand pivot transfer with +1 assist and 2nd person nearby. Return to supine with +2 assist  at trunk and BLE. Session concluded with patient lying supine in bed with call bell within reach, bed alarm activated, and all needs met.   Therapy Documentation Precautions:  Precautions Precautions: Fall, Other (comment) Precaution Comments: R hemiparesis Restrictions Weight Bearing Restrictions: No Other Position/Activity Restrictions: protect weak RUE General:    Therapy/Group: Individual Therapy  Brea Coleson R [place]Davis 06/22/2020, 7:27 AM

## 2020-06-22 NOTE — Progress Notes (Signed)
Physical Therapy Weekly Progress Note  Patient Details  Name: Peter Ryan MRN: [id number] Date of Birth: 20-Aug-1951  Beginning of progress report period: June 18, 2020 End of progress report period: June 22, 2020  Today's Date: 06/22/2020 PT Individual Time: 0802-0857 PT Individual Time Calculation (min): 55 min   Patient has met 4 of 4 short term goals.  Pt is making steady progress towards LTG. Over the past week he has had noted improvement in midline orientation and attention to the RLE/RUE to allow progressing to mod assist bed mobility mod assist sit<>stand and stand pivot transfers and initiation of WC mobility with mod assist using BUE. Pt has also demonstrated ability to perform gait training with moderate assist and WC follow for safety due. Mild inattention, impulsivity and midline disorientation have limited great progress to this point.   Patient continues to demonstrate the following deficits muscle weakness and muscle joint tightness, decreased cardiorespiratoy endurance, unbalanced muscle activation, motor apraxia, decreased coordination and decreased motor planning, decreased midline orientation, decreased attention to right, decreased motor planning and ideational apraxia, decreased initiation, decreased attention, decreased awareness, decreased problem solving, decreased safety awareness and delayed processing and decreased sitting balance, decreased standing balance, decreased postural control, hemiplegia and decreased balance strategies and therefore will continue to benefit from skilled PT intervention to increase functional independence with mobility.  Patient progressing toward long term goals..  Continue plan of care.  PT Short Term Goals Week 1:  PT Short Term Goal 1 (Week 1): Pt will perform supine<>sit with max assist of 1 PT Short Term Goal 1 - Progress (Week 1): Met PT Short Term Goal 2 (Week 1): Pt will perform sit<>stands with +2 max assist using LRAD  (not lift equipment) PT Short Term Goal 2 - Progress (Week 1): Met PT Short Term Goal 3 (Week 1): Pt will perform bed<>chair transfers with +2 max assist without use of lift equipment PT Short Term Goal 3 - Progress (Week 1): Met PT Short Term Goal 4 (Week 1): Pt will initiate gait training PT Short Term Goal 4 - Progress (Week 1): Met Week 2:  PT Short Term Goal 1 (Week 2): Pt will ambulate 36ft with mod assist and LRAD PT Short Term Goal 2 (Week 2): Pt will perform bed mobility with min assist PT Short Term Goal 3 (Week 2): Pt will transfer to and from [facility] with mod assist PT Short Term Goal 4 (Week 2): Pt will propell WC 196ft with min assist  Skilled Therapeutic Interventions/Progress Updates:   Pt received supine in bed and agreeable to PT. Supine>sit transfer with max assist and max cues for sequencing and improved attention to the RUE/RLE to positioning to aid in push to EOB as well as postural aliignment. Sit<>stand from PT with mod assist initially to pull pants to waist. Stand pivot transfer with mod assist from PT, max cues for AD management, seuqecning and midline orientation as well as + 2 present for safety.   Pt transported to rehab gym in [facility] Stand pivot transfer to mat table with mod assist and moderate cues for AD management and sequencing as listed above. Pt performed lateral reach to the L with RUE on RW to improve midline orientation; visual feedback from mirror throughout as needed. Pt performed lateral reach 6 x 2 to place and remove clothes pins from basketball net.   Gait training with RW, mod assist and +2 for close WC follow x 46ft. Mod-max cues for use of visula feedback from mirror,  improved sequencing, and attention the RLE to maintain terminal knee extension in stance. Noted to have consistent ER in the RLE hip throughout.   PT applied tband to R side 22'x20' WC. WC propulsion with BUE and moderate assist to force symmetry in the RUE/LUE x 151ft.   Pt reports need  for BM. Transported to toilet in stedy and min assist from PT. Pt reports void, but no noted stool in toilet. Patient returned to room and left sitting in [facility] with call bell in reach and all needs met.        Therapy Documentation Precautions:  Precautions Precautions: Fall, Other (comment) Precaution Comments: R hemiparesis Restrictions Weight Bearing Restrictions: No Other Position/Activity Restrictions: protect weak RUE Vital Signs: Therapy Vitals Temp: 98 F (36.7 C) Pulse Rate: 67 Resp: 17 BP: (!) 147/67 Patient Position (if appropriate): Lying Oxygen Therapy SpO2: 98 % O2 Device: Room Air Pain: Pain Assessment Pain Scale: 0-10 Pain Score: 0-No pain       Therapy/Group: Individual Therapy  Lorie Phenix 06/22/2020, 8:59 AM

## 2020-06-23 ENCOUNTER — Inpatient Hospital Stay (HOSPITAL_COMMUNITY): Payer: Medicare Other

## 2020-06-23 ENCOUNTER — Other Ambulatory Visit: Payer: Self-pay | Admitting: Family Medicine

## 2020-06-23 ENCOUNTER — Inpatient Hospital Stay (HOSPITAL_COMMUNITY): Payer: Medicare Other | Admitting: Occupational Therapy

## 2020-06-23 ENCOUNTER — Ambulatory Visit: Payer: Medicare Other

## 2020-06-23 ENCOUNTER — Inpatient Hospital Stay (HOSPITAL_COMMUNITY): Payer: Medicare Other | Admitting: Physical Therapy

## 2020-06-23 DIAGNOSIS — C8333 Diffuse large B-cell lymphoma, intra-abdominal lymph nodes: Secondary | ICD-10-CM

## 2020-06-23 LAB — GLUCOSE, CAPILLARY
Glucose-Capillary: 144 mg/dL — ABNORMAL HIGH (ref 70–99)
Glucose-Capillary: 163 mg/dL — ABNORMAL HIGH (ref 70–99)
Glucose-Capillary: 211 mg/dL — ABNORMAL HIGH (ref 70–99)

## 2020-06-23 NOTE — Progress Notes (Signed)
Pt getting back into bed and stated he was exhausted from shower with OT and working with PT. SLP offered variety of tasks however pt stated he would like to rest.

## 2020-06-23 NOTE — Progress Notes (Signed)
Physical Therapy Session Note  Patient Details  Name: Peter Ryan MRN: [id number] Date of Birth: Oct 29, 1950  Today's Date: 06/23/2020 PT Individual Time: 0909-1002 PT Individual Time Calculation (min): 53 min   Short Term Goals: Week 2:  PT Short Term Goal 1 (Week 2): Pt will ambulate 78f with mod assist and LRAD PT Short Term Goal 2 (Week 2): Pt will perform bed mobility with min assist PT Short Term Goal 3 (Week 2): [facility] Short Term Goal 4 (Week 2): Pt will propell WC 1062fwith min assist  Skilled Therapeutic Interventions/Progress Updates:   Pt received supine in bed and agreeable to PT. Supine>sit transfer with moderate assist and moderate cues for use or RUE to push into sitting. Sitting balance EOB x 5 minutes with supervision assist to don socks and pants over feet. Sit<>stand from EOB with mod assist and +2 assist to pull pants to waist for safety. Moderate cues for terminal knee extension on the RLE.   [facility] for safety and max cues for sequencing from PT. With emphasis on improved midline orientation.   WC mobility through rehab unit x [id number] mod assist from PT to facilitate improved shoulder ROM on the R and endure symmetry.  Pt performed sit<>stnad transfer from WC [place] throughout session with mod assist overall to facilitate improved anterior weight shift and cues for LE placement.  pregait weight shifting R and L with symmetrical stance. Then sustained standing balance in partial lunge 2 x 30sec Bil with BUE support on RW and visual feedback from mirror.   Gait training with RW and visual feedback from mirror 2 x [id number] mod assist and +2 for WC follow. Max cues for sequencing or AD and proper weight shift to allow swing through on the R, as well as cues for use of visual feedback from mirror to improve midline orientation.   Kinetron reciprocal movement 2 x 2 min with min assist  throughout to improve hip flexor activation as well as improved ER/IR position of the RLE. 1 prolonged therapeutic rest break between bouts required by pt.   Patient returned to room and [facility] reach and all needs met.        Therapy Documentation Precautions:  Precautions Precautions: Fall, Other (comment) Precaution Comments: R hemiparesis Restrictions Weight Bearing Restrictions: No Other Position/Activity Restrictions: protect weak RUE Vital Signs: Therapy Vitals Temp: (!) 97.3 F (36.3 C) Pulse Rate: 64 Resp: 18 BP: (!) 143/64 Patient Position (if appropriate): Lying Oxygen Therapy SpO2: 98 % O2 Device: Room Air Pain:    Denies Therapy/Group: Individual Therapy  AustLorie Phenix4/2021, 10:03 AM

## 2020-06-23 NOTE — Progress Notes (Signed)
[place] PHYSICAL MEDICINE & REHABILITATION PROGRESS NOTE   Subjective/Complaints: No issues overnite  ROS: Patient denies CP, SOB N/V/D Objective:   No results found. Recent Labs    06/21/20 1024  WBC 7.4  HGB 13.0  HCT 40.1  PLT 246   No results for input(s): NA, K, CL, CO2, GLUCOSE, BUN, CREATININE, CALCIUM in the last 72 hours.  Intake/Output Summary (Last 24 hours) at 06/23/2020 0803 Last data filed at 06/22/2020 2052 Gross per 24 hour  Intake 580 ml  Output 300 ml  Net 280 ml       Physical Exam: Vital Signs Blood pressure (!) 143/64, pulse 64, temperature (!) 97.3 F (36.3 C), resp. rate 18, height 5\' 10"  (1.778 m), weight 129.3 kg, SpO2 98 %.    General: No acute distress Mood and affect are appropriate Heart: Regular rate and rhythm no rubs murmurs or extra sounds Lungs: Clear to auscultation, breathing unlabored, no rales or wheezes Abdomen: Positive bowel sounds, soft nontender to palpation, nondistended Extremities: No clubbing, cyanosis, or edema Skin: No evidence of breakdown, no evidence of rash   Motor 3- RUE and RLE 5/5 on left  Musculoskeletal: Full range of motion in all 4 extremities. No joint swelling    Assessment/Plan: 1. Functional deficits secondary to Left pontine infarct  which require 3+ hours per day of interdisciplinary therapy in a comprehensive inpatient rehab setting.  Physiatrist is providing close team supervision and 24 hour management of active medical problems listed below.  Physiatrist and rehab team continue to assess barriers to discharge/monitor patient progress toward functional and medical goals  Care Tool:  Bathing  Bathing activity did not occur:  (N/A) Body parts bathed by patient: Right arm, Chest, Abdomen, Front perineal area, Face, Right upper leg, Left upper leg   Body parts bathed by helper: Left arm, Buttocks, Right lower leg, Left lower leg     Bathing assist Assist Level: Moderate Assistance -  Patient 50 - 74%     Upper Body Dressing/Undressing Upper body dressing   What is the patient wearing?: Pull over shirt    Upper body assist Assist Level: Maximal Assistance - Patient 25 - 49%    Lower Body Dressing/Undressing Lower body dressing      What is the patient wearing?: Incontinence brief, Pants     Lower body assist Assist for lower body dressing: Total Assistance - Patient < 25%     Chartered loss adjuster assist Assist for toileting: 2 Helpers Assistive Device Comment: Bariatric BSC   Transfers Chair/bed transfer  Transfers assist  Chair/bed transfer activity did not occur: N/A  Chair/bed transfer assist level: Dependent - mechanical lift (stedy)     Locomotion Ambulation   Ambulation assist   Ambulation activity did not occur: N/A          Walk 10 feet activity   Assist  Walk 10 feet activity did not occur: N/A        Walk 50 feet activity   Assist Walk 50 feet with 2 turns activity did not occur: N/A         Walk 150 feet activity   Assist Walk 150 feet activity did not occur: N/A         Walk 10 feet on uneven surface  activity   Assist Walk 10 feet on uneven surfaces activity did not occur: N/A         Wheelchair     Assist Will patient use wheelchair  at discharge?: No (Per PT goals )             Wheelchair 50 feet with 2 turns activity    Assist            Wheelchair 150 feet activity     Assist          Blood pressure (!) 143/64, pulse 64, temperature (!) 97.3 F (36.3 C), resp. rate 18, height 5\' 10"  (1.778 m), weight 129.3 kg, SpO2 98 %.  Medical Problem List and Plan: 1.  Right hemiparesis secondary to left pontine infarction.  Follow-up outpatient cardiology services to discuss loop recorder             -patient may shower             -ELOS/Goals: 10/12/supervision/min a             Continue CIR PT, OT,SLP  2.  Antithrombotics: -DVT/anticoagulation: SCDs              -antiplatelet therapy: Aspirin 325 mg daily 3. Pain Management: Tylenol as needed. Well controlled.  4. Mood: Provide emotional support             -antipsychotic agents: N/A 5. Neuropsych: This patient is capable of making decisions on his own behalf. 6. Skin/Wound Care: Routine skin checks 7. Fluids/Electrolytes/Nutrition: Routine in and outs.  CMP ordered for tomorrow. 8.  Hypertension.  Norvasc 10 mg daily, Cozaar 100 mg daily.           well controlled.  Vitals:   06/22/20 1939 06/23/20 0609  BP: (!) 153/81 (!) 143/64  Pulse: 72 64  Resp: 18 18  Temp: 98.4 F (36.9 C) (!) 97.3 F (36.3 C)  SpO2: 96% 98%  9.  Diabetes mellitus with hyperglycemia.  Hemoglobin A1c 7.5.  SSI.          CBG (last 3)  Recent Labs    06/22/20 1620 06/22/20 2101 06/23/20 0611  GLUCAP 174* 149* 144*  good control 9/24  10.  BPH.  Proscar.               Check PVRs 11.  Hyperlipidemia.  Lipitor--discussed rationale for statin after stroke and in general. He wishes to hold regardless 12.  History of gout.  Zyloprim 300 mg daily.  Monitor for any gout flareups 13.  History of non-Hodgkin's lymphoma status post chemotherapy.  Follow-up Dr Randa Evens as outpatient. 14.  Morbid obesity.  BMI 40.89.  Dietary follow-up 15.  History of rectal outlet bleeding.  Felt to be related to possible hemorrhoids.  Hemoglobin hematocrit mains stable.  He was to follow-up outpatient gastroenterology services.  Patient had deferred GI work-up while in the hospital. Hgb low nl 16. Constipation improved with laxatives, no gross bleeding , repeat CBC normal 9/22    LOS: 9 days A FACE TO [place] E Cassell Voorhies 06/23/2020, 8:03 AM

## 2020-06-23 NOTE — Progress Notes (Signed)
Occupational Therapy Weekly Progress Note  Patient Details  Name: Peter Ryan MRN: [id number] Date of Birth: 28-Sep-1951  Beginning of progress report period: June 15, 2020 End of progress report period: June 23, 2020  Today's Date: 06/23/2020 OT Individual Time: 1045-1200 OT Individual Time Calculation (min): 75 min    Patient has met 3 of 4 short term goals. Patient making steady progress toward goals demonstrating ability to thread LUE and head through UB clothing in semi-seated/perched position in [place] with use of hemi-dressing technique. Upon evaluation, patient was requiring Total A to don UB clothing. Patient also demonstrates increased static sitting balance with ability to maintain for >15 minutes with close supervision A. Patient has progressed from requiring Max A +2 for stand-pivot transfers to requiring Max to Mod A +1 with use of bariatric RW. Patient continues to require assist to thread BLE in LB clothing 2/2 girth and decreased flexibility.   Patient continues to demonstrate the following deficits: muscle weakness and muscle paralysis,decreased cardiorespiratoy endurance,abnormal tone, decreased coordination, decreased sitting balance, decreased standing balance, decreased postural control, hemiplegia and decreased balance strategies and therefore will continue to benefit from skilled OT intervention to enhance overall performance with BADL and Reduce care partner burden.  Patient progressing toward long term goals..  Continue plan of care.  OT Short Term Goals Week 1:  OT Short Term Goal 1 (Week 1): Patient will complete 2/3 parts of UB dressing seated EOB with use of hemi technique. OT Short Term Goal 1 - Progress (Week 1): Met OT Short Term Goal 2 (Week 1): Patient will thread LLE into LB clothing at bed level. OT Short Term Goal 2 - Progress (Week 1): Progressing toward goal OT Short Term Goal 3 (Week 1): Patient will maintain static sitting balance at EOB  for 15 minutes with close supervision assist in prep for BADLs. OT Short Term Goal 3 - Progress (Week 1): Met OT Short Term Goal 4 (Week 1): Patient will complete functional transfers with LRAD and Max A +1. OT Short Term Goal 4 - Progress (Week 1): Met OT Short Term Goal 5 (Week 1): Patient will complete UB dressing with Min A and use of hemi technique. Week 2:  OT Short Term Goal 1 (Week 2): Patient will thread LLE in LB clothing seated EOB. OT Short Term Goal 2 (Week 2): Patient will complete functional transfers with LRAD and Mod A +1. OT Short Term Goal 3 (Week 2): Patient will incorporate RUE into 1/3 grooming tasks with Min A.  Skilled Therapeutic Interventions/Progress Updates:  Patient met seated in wc in agreement with OT treatment session with focus on self-care re-education, NMR, and there act. Walk-in shower transfer to TTB with use of Stedy. Patient able to incorporate RUE in UB bathing tasks but requires hand over hand assist to wash LUE 2/2 decreased wrist/digit extension. Patient able to wash front perineal area, upper thighs and lower legs/feet with assist to facilitate figure-4 position. UB dressing in perched position on Stedy at sink level with visual feedback from mirror. Patient able to thread head and LUE without assist but required assist to thread RUE with cues for orientation to midline. +2 assist to thread BLE in brief/pants and hike over hips in standing with use of Stedy. NMR with towel pushes and Saebo e-stim unit. Session concluded with with Saebo e-stim applied to R wrist/digit extensors, call bell within reach and all needs met.   Therapy Documentation Precautions:  Precautions Precautions: Fall, Other (comment) Precaution Comments: R  hemiparesis Restrictions Weight Bearing Restrictions: No Other Position/Activity Restrictions: protect weak RUE General:   Therapy/Group: Individual Therapy  Destanae R Howerton-Davis 06/23/2020, 12:48 PM

## 2020-06-24 ENCOUNTER — Inpatient Hospital Stay (HOSPITAL_COMMUNITY): Payer: Medicare Other

## 2020-06-24 LAB — GLUCOSE, CAPILLARY
Glucose-Capillary: 127 mg/dL — ABNORMAL HIGH (ref 70–99)
Glucose-Capillary: 149 mg/dL — ABNORMAL HIGH (ref 70–99)
Glucose-Capillary: 175 mg/dL — ABNORMAL HIGH (ref 70–99)
Glucose-Capillary: 185 mg/dL — ABNORMAL HIGH (ref 70–99)

## 2020-06-24 NOTE — Progress Notes (Signed)
SLP Cancellation Note  Patient Details Name: Peter Ryan MRN: [id number] DOB: December 11, 1950   Cancelled treatment:       SLP offered ST services, however pt refused due to unscheduled treatment and requested to rest. Pt was left in room with wife.         Latonia Conrow  [facility] 06/24/2020, 2:39 PM

## 2020-06-24 NOTE — Progress Notes (Signed)
Physical Therapy Session Note  Patient Details  Name: Peter Ryan MRN: [id number] Date of Birth: 03-Oct-1950  Today's Date: 06/24/2020 PT Individual Time: 0915-1020 PT Individual Time Calculation (min): 65 min   Short Term Goals:   Week 2:  PT Short Term Goal 1 (Week 2): Pt will ambulate 62ft with mod assist and LRAD PT Short Term Goal 2 (Week 2): Pt will perform bed mobility with min assist PT Short Term Goal 3 (Week 2): Pt will transfer to and from [facility] with mod assist PT Short Term Goal 4 (Week 2): Pt will propell WC 143ft with min assist  Skilled Therapeutic Interventions/Progress Updates:   Pt presents in bed, eager for therapy session but also requires positive reinforcement and encouragement throughout session as pt appears somewhat discouraged. Mod assist to facilitate coming to sit EOB with cues for sequencing and technique and facilitation through trunk. Pt with tendency for R lateral lean during dynamic balance activities requiring min to mod assist and verbal cues for awareness. Pt worked on threading pants (assistance required by PT to get started) and assist with orientation of clothing needed as well. Utilized stedy for sit <> stands to work on standing balance and attempted to patient start to initiate assisting with clothing management with LUE (wife wanted to just do it for him so encouraged to allow him to try) with mod assist for balance in stedy while performing this activity. Sit <> stand in stedy was min assist and assist with RUE management and placement. At sink, pt performed oral hygiene with set up assistance and focus on functional use of RUE as stabilizer. Utilized Lite Gait harness to focus on NMR during sit <> stands and standing balance/postural control retraining. Pt able to perform inconsistent sit <. Stands with mod assist and UE support on handle bars for donning of sling and during activity but also at times unable to coordinate anterior weightshift enough and  required total assist by lift. RLE kept slipping ( had already discussed with wife to bring in sneakers today for better grip and overall support- she was in agreement) and pt with tendency for posterior lean in standing. When able to coordinate, could maintain standing without support of sling with min assist statically. Endurance was also limiting factor and required several rest breaks. Verbal and tactile cues provided to promote trunk and hip extension, stance control on R, and progressed to stepping in place to progress for pre-gait activities. Pt declined staying up in w/c at end of session, so utilized stedy for transfer back to bed and mod assist for sit to supine for management of trunk and RLE for repositioning.   Therapy Documentation Precautions:  Precautions Precautions: Fall, Other (comment) Precaution Comments: R hemiparesis Restrictions Weight Bearing Restrictions: No Other Position/Activity Restrictions: protect weak RUE  Pain: Pain Assessment Pain Scale: 0-10 Pain Score: 0-No pain    Therapy/Group: Individual Therapy  [name], PT, DPT, CBIS  06/24/2020, 11:36 AM

## 2020-06-25 ENCOUNTER — Inpatient Hospital Stay (HOSPITAL_COMMUNITY): Payer: Medicare Other | Admitting: Physical Therapy

## 2020-06-25 LAB — GLUCOSE, CAPILLARY
Glucose-Capillary: 156 mg/dL — ABNORMAL HIGH (ref 70–99)
Glucose-Capillary: 142 mg/dL — ABNORMAL HIGH (ref 70–99)
Glucose-Capillary: 191 mg/dL — ABNORMAL HIGH (ref 70–99)
Glucose-Capillary: 171 mg/dL — ABNORMAL HIGH (ref 70–99)

## 2020-06-25 NOTE — Progress Notes (Signed)
Physical Therapy Session Note  Patient Details  Name: Peter Ryan MRN: [id number] Date of Birth: 03-28-1951  Today's Date: 06/25/2020 PT Individual Time: 0800-0900; 1300-1355 PT Individual Time Calculation (min): 60 min and 55 min  Short Term Goals: Week 2:  PT Short Term Goal 1 (Week 2): Pt will ambulate 52ft with mod assist and LRAD PT Short Term Goal 2 (Week 2): Pt will perform bed mobility with min assist PT Short Term Goal 3 (Week 2): Pt will transfer to and from [facility] with mod assist PT Short Term Goal 4 (Week 2): Pt will propell WC 135ft with min assist  Skilled Therapeutic Interventions/Progress Updates:    Session 1: Pt received seated in bed, agreeable to PT session. No complaints of pain. Pt is mod A for semi-reclined in bed to sitting EOB for some LE management and trunk control, heavy lean to the R in sitting initially. Pt is max A to don pants while seated EOB. Sit to stand with min A to stedy. Stedy transfer to w/c. Dependent transport via w/c to/from therapy gym for time conservation. Stedy transfer to mat table. Session focus on sitting balance and R NMR via forced use. Seated latera leans L/R 2 x 5 reps each direction with use of mirror for visual feedback for return to midline. Pt is min A to return to midline from R lateral lean, SBA from L lateral lean. Seated crunches 2 x 5 reps with assist x 2 for core activation and trunk control to return to midline. Pt exhibits posterior lean in sitting with onset of fatigue. Seated alt UE reaching outside BOS with min A for trunk control and some support needed for RUE to maintain shoulder flexion. Pt then found to be incontinent of BM. Returned to room. Pt able to stand in stedy with min A for dependent pericare, brief change, and clothing change. Pt requests to return to bed at end of session. Sit to supine mod A for BLE management. Pt left semi-reclined in bed with needs in reach, bed alarm in place at end of session.  Session 2: Pt  received seated in bed, agreeable to PT session. No complaints of pain. Bed mobility mod A for trunk control. Assisted pt with donning shorts and tennis shoes while seated EOB. Sit to stand with min A to stedy, dependent to pull shorts up over hips. Pt then found to be incontinent of urine, assisted pt with brief change in standing. Dependent transport via w/c to/from therapy gym for time conservation. Set pt up with LiteGait sling for work in standing. Sit to stand with min A to LiteGait with R knee blocked. Pt exhibits decreased ability to maintain BLE knee extension in standing and exhibits increased knee flexion with onset of fatigue. Standing mini-squats x 10 reps, x 5 reps with decreased assist from [place] in standing. Pt fatigues quickly with standing activity. Pt reports urge to use the bathroom. Stedy transfer to elevated BSC over toilet, pt is dependent for clothing management. Pt left seated on toilet in bathroom with call button in reach, stedy in place, wife present in room.  Therapy Documentation Precautions:  Precautions Precautions: Fall, Other (comment) Precaution Comments: R hemiparesis Restrictions Weight Bearing Restrictions: No Other Position/Activity Restrictions: protect weak RUE    Therapy/Group: Individual Therapy   Excell Seltzer, PT, DPT  06/25/2020, 12:00 PM

## 2020-06-25 NOTE — Progress Notes (Signed)
[place] PHYSICAL MEDICINE & REHABILITATION PROGRESS NOTE   Subjective/Complaints: Pt without new complaints.   ROS: Patient denies fever, rash, sore throat, blurred vision, nausea, vomiting, diarrhea, cough, shortness of breath or chest pain, joint or back pain, headache, or mood change.    Objective:   No results found. No results for input(s): WBC, HGB, HCT, PLT in the last 72 hours. No results for input(s): NA, K, CL, CO2, GLUCOSE, BUN, CREATININE, CALCIUM in the last 72 hours.  Intake/Output Summary (Last 24 hours) at 06/25/2020 1145 Last data filed at 06/25/2020 0730 Gross per 24 hour  Intake 297 ml  Output 750 ml  Net -453 ml       Physical Exam: Vital Signs Blood pressure 125/64, pulse 69, temperature 97.9 F (36.6 C), resp. rate 18, height 5\' 10"  (1.778 m), weight 129.3 kg, SpO2 94 %.    Constitutional: No distress . Vital signs reviewed. HEENT: EOMI, oral membranes moist Neck: supple Cardiovascular: RRR without murmur. No JVD    Respiratory/Chest: CTA Bilaterally without wheezes or rales. Normal effort    GI/Abdomen: BS +, non-tender, non-distended Ext: no clubbing, cyanosis, or edema Psych: pleasant and cooperative Skin: No evidence of breakdown, no evidence of rash   Motor 3- RUE and RLE 5/5 on left  Musculoskeletal: Full range of motion in all 4 extremities. No joint swelling    Assessment/Plan: 1. Functional deficits secondary to Left pontine infarct  which require 3+ hours per day of interdisciplinary therapy in a comprehensive inpatient rehab setting.  Physiatrist is providing close team supervision and 24 hour management of active medical problems listed below.  Physiatrist and rehab team continue to assess barriers to discharge/monitor patient progress toward functional and medical goals  Care Tool:  Bathing  Bathing activity did not occur:  (N/A) Body parts bathed by patient: Right arm, Chest, Abdomen, Front perineal area, Face, Right  upper leg, Left upper leg   Body parts bathed by helper: Left arm, Buttocks, Right lower leg, Left lower leg     Bathing assist Assist Level: Moderate Assistance - Patient 50 - 74%     Upper Body Dressing/Undressing Upper body dressing   What is the patient wearing?: Pull over shirt    Upper body assist Assist Level: Moderate Assistance - Patient 50 - 74%    Lower Body Dressing/Undressing Lower body dressing      What is the patient wearing?: Incontinence brief, Pants     Lower body assist Assist for lower body dressing: 2 Helpers     Toileting Toileting    Toileting assist Assist for toileting: 2 Helpers Assistive Device Comment: Bariatric BSC   Transfers Chair/bed transfer  Transfers assist  Chair/bed transfer activity did not occur: N/A  Chair/bed transfer assist level: Dependent - mechanical lift     Locomotion Ambulation   Ambulation assist   Ambulation activity did not occur: N/A  Assist level: 2 helpers Assistive device: Walker-rolling Max distance: 25   Walk 10 feet activity   Assist  Walk 10 feet activity did not occur: N/A  Assist level: 2 helpers Assistive device: Walker-rolling   Walk 50 feet activity   Assist Walk 50 feet with 2 turns activity did not occur: N/A         Walk 150 feet activity   Assist Walk 150 feet activity did not occur: N/A         Walk 10 feet on uneven surface  activity   Assist Walk 10 feet on uneven surfaces  activity did not occur: N/A         Wheelchair     Assist Will patient use wheelchair at discharge?: No (Per PT goals )      Wheelchair assist level: Moderate Assistance - Patient 50 - 74% Max wheelchair distance: 150    Wheelchair 50 feet with 2 turns activity    Assist        Assist Level: Moderate Assistance - Patient 50 - 74%   Wheelchair 150 feet activity     Assist      Assist Level: Moderate Assistance - Patient 50 - 74%   Blood pressure 125/64,  pulse 69, temperature 97.9 F (36.6 C), resp. rate 18, height 5\' 10"  (1.778 m), weight 129.3 kg, SpO2 94 %.  Medical Problem List and Plan: 1.  Right hemiparesis secondary to left pontine infarction.  Follow-up outpatient cardiology services to discuss loop recorder             -patient may shower             -ELOS/Goals: 10/12/supervision/min a             Continue CIR PT, OT,SLP  2.  Antithrombotics: -DVT/anticoagulation: SCDs             -antiplatelet therapy: Aspirin 325 mg daily 3. Pain Management: Tylenol as needed. Well controlled.  4. Mood: Provide emotional support             -antipsychotic agents: N/A 5. Neuropsych: This patient is capable of making decisions on his own behalf. 6. Skin/Wound Care: Routine skin checks 7. Fluids/Electrolytes/Nutrition: Routine in and outs.  CMP ordered for tomorrow. 8.  Hypertension.  Norvasc 10 mg daily, Cozaar 100 mg daily.           well controlled 9/26  Vitals:   06/24/20 1943 06/25/20 0517  BP: (!) 144/71 125/64  Pulse: 81 69  Resp: 18 18  Temp: 98.3 F (36.8 C) 97.9 F (36.6 C)  SpO2: 96% 94%  9.  Diabetes mellitus with hyperglycemia.  Hemoglobin A1c 7.5.  SSI.          CBG (last 3)  Recent Labs    06/24/20 2100 06/25/20 0629 06/25/20 1130  GLUCAP 185* 156* 142*  fair to borderline control 9/26 10.  BPH.  Proscar.               emptying well 11.  Hyperlipidemia.  Lipitor--discussed rationale for statin after stroke and in general. He wishes to hold regardless 12.  History of gout.  Zyloprim 300 mg daily.  Monitor for any gout flareups 13.  History of non-Hodgkin's lymphoma status post chemotherapy.  Follow-up Dr Randa Evens as outpatient. 14.  Morbid obesity.  BMI 40.89.  Dietary follow-up 15.  History of rectal outlet bleeding.  Felt to be related to possible hemorrhoids.  Hemoglobin hematocrit mains stable.  He was to follow-up outpatient gastroenterology services.  Patient had deferred GI work-up while in the  hospital. Hgb low nl 16. Constipation improved with laxatives: emptying regularly now  - no gross bleeding , repeat CBC normal 9/22    LOS: 11 days A FACE TO FACE EVALUATION WAS PERFORMED  Meredith Staggers 06/25/2020, 11:45 AM

## 2020-06-26 ENCOUNTER — Inpatient Hospital Stay (HOSPITAL_COMMUNITY): Payer: Medicare Other | Admitting: Speech Pathology

## 2020-06-26 ENCOUNTER — Inpatient Hospital Stay (HOSPITAL_COMMUNITY): Payer: Medicare Other

## 2020-06-26 ENCOUNTER — Inpatient Hospital Stay (HOSPITAL_COMMUNITY): Payer: Medicare Other | Admitting: Occupational Therapy

## 2020-06-26 LAB — URINALYSIS, ROUTINE W REFLEX MICROSCOPIC
Bacteria, UA: NONE SEEN
Bilirubin Urine: NEGATIVE
Glucose, UA: 50 mg/dL — AB
Hgb urine dipstick: NEGATIVE
Ketones, ur: NEGATIVE mg/dL
Leukocytes,Ua: NEGATIVE
Nitrite: NEGATIVE
Protein, ur: 30 mg/dL — AB
Specific Gravity, Urine: 1.026 (ref 1.005–1.030)
pH: 5 (ref 5.0–8.0)

## 2020-06-26 LAB — GLUCOSE, CAPILLARY
Glucose-Capillary: 155 mg/dL — ABNORMAL HIGH (ref 70–99)
Glucose-Capillary: 146 mg/dL — ABNORMAL HIGH (ref 70–99)
Glucose-Capillary: 151 mg/dL — ABNORMAL HIGH (ref 70–99)
Glucose-Capillary: 182 mg/dL — ABNORMAL HIGH (ref 70–99)
Glucose-Capillary: 162 mg/dL — ABNORMAL HIGH (ref 70–99)

## 2020-06-26 NOTE — Progress Notes (Signed)
Occupational Therapy Session Note  Patient Details  Name: Peter Ryan MRN: [id number] Date of Birth: 05-Jul-1951  Today's Date: 06/26/2020 OT Individual Time: 9030-0923 OT Individual Time Calculation (min): 74 min   Short Term Goals: Week 2:  OT Short Term Goal 1 (Week 2): Patient will thread LLE in LB clothing seated EOB. OT Short Term Goal 2 (Week 2): Patient will complete functional transfers with LRAD and Mod A +1. OT Short Term Goal 3 (Week 2): Patient will incorporate RUE into 1/3 grooming tasks with Min A.  Skilled Therapeutic Interventions/Progress Updates:    Pt greeted in the recliner with no c/o pain, agreeable to shower but wanted to use the restroom first. Pt engaged in toileting (using Stedy +1 assist), bathing (seated on TTB using lateral leans), dressing (sit<stand from elevated bed using RW), and oral care/grooming tasks (semi perched in [place] in front of sink). Tx focus placed on Rt NMR, postural control/postural awareness, functional transfers, and sit<stands. Mod balance assistance while pt assisted with clothing mgt during toileting (using both Rt and Lt hands!), pt with continent liquid BM and needed Max A for hygiene. Transitioned to shower after, pt needing assistance for washing buttocks, both feet, and Lt underarm. Pt able to use Rt hand to wash the majority of his Lt arm, and also Rt thigh. Vcs to increase functional use of affected UE. CGA and vcs for neutral midline while threading both LEs into pants while EOB. Min A for sit<stand with RW with Rt knee supported, increased buckling when he removed the Lt hand to assist with pulling pants over hips, Mod balance assistance at this time. Active assist for using Rt hand to reach for needed items during sinkside ADL tasks and also for using Rt hand to use his toothbrush. Questioning cues and CGA for maintaining upright neutral posture due to Rt lean. Pt declined sitting up in the recliner, opting to return to bed due to  fatigue. Mod A to return to bed. Left him with all needs within reach and bed alarm set.     Therapy Documentation Precautions:  Precautions Precautions: Fall, Other (comment) Precaution Comments: R hemiparesis Restrictions Weight Bearing Restrictions: No Other Position/Activity Restrictions: protect weak RUE Vital Signs: Therapy Vitals Temp: 98.2 F (36.8 C) Pulse Rate: 75 Resp: 17 BP: (!) 154/67 Patient Position (if appropriate): Lying Oxygen Therapy SpO2: 95 % O2 Device: Room Air ADL: ADL Eating: Set up Where Assessed-Eating: Bed level Grooming: Setup Where Assessed-Grooming: Bed level Upper Body Bathing: Moderate cueing Where Assessed-Upper Body Bathing: Bed level Lower Body Bathing: Maximal assistance Where Assessed-Lower Body Bathing: Bed level Upper Body Dressing: Maximal assistance Where Assessed-Upper Body Dressing: Bed level Lower Body Dressing: Maximal assistance Where Assessed-Lower Body Dressing: Bed level Toileting: Not assessed Toilet Transfer: Not assessed      Therapy/Group: Individual Therapy  [name] 06/26/2020, 1:46 PM

## 2020-06-26 NOTE — Progress Notes (Signed)
Speech Language Pathology Daily Session Note  Patient Details  Name: Peter Ryan MRN: [id number] Date of Birth: [birth year]  Today's Date: 06/26/2020 SLP Individual Time: 1330-1415 SLP Individual Time Calculation (min): 45 min  Short Term Goals: Week 2: SLP Short Term Goal 1 (Week 2): Pt will use 90% intelligible speech in conversation with Supervision A verbal cues for use of compensatory strategies. SLP Short Term Goal 2 (Week 2): Pt will demonstrate ability to problem solve mildly complex to complex tasks with Min A verbal/visual cues. SLP Short Term Goal 3 (Week 2): Pt will anticipate 2 ADLs he will need assistance with at discharge with Min A verbal/visual cues.  Skilled Therapeutic Interventions: Pt was seen for skilled ST targeting speech goals and education with pt and family. Reviewed all cognitive, swallow, and speech goals with pt and wife at bedside. Discussed compensatory strategies to minimize aspiration risk with thins and pt recalled strategy to clear buccal pocketing Mod I. He and wife report pt is back to cognitive baseline and do not feel further ST is warranted. All questions answered to their satisfaction.  Speech interventions focused on recall and use of compensatory strategies for intelligibility at sentence and conversation levels. Pt remained 95% intelligible during informal conversation ad 100% intelligible at sentence level during barrier speech task. Mod I for verbal recall of strategies. Discussed potential impact of fatigue, phone calls, and environments with background noise. Recommend follow up 1 additional session prior to potential early d/c from [place], given that pt is near cognitive baseline and goal level for CIR. Pt and wife in verbal agreement with plan. Pt left laying in bed with alarm set and needs within reach. Continue per current plan of care.          Pain Pain Assessment Pain Scale: 0-10 Pain Score: 0-No pain  Therapy/Group: Individual  Therapy  Arbutus Leas 06/26/2020, 3:11 PM

## 2020-06-26 NOTE — Progress Notes (Signed)
[place] PHYSICAL MEDICINE & REHABILITATION PROGRESS NOTE   Subjective/Complaints:  No issues overnite , bowels moving 1-2 x per day   ROS: Patient denies CP, SOB, N/V/D   Objective:   No results found. No results for input(s): WBC, HGB, HCT, PLT in the last 72 hours. No results for input(s): NA, K, CL, CO2, GLUCOSE, BUN, CREATININE, CALCIUM in the last 72 hours.  Intake/Output Summary (Last 24 hours) at 06/26/2020 0744 Last data filed at 06/26/2020 0600 Gross per 24 hour  Intake 240 ml  Output 400 ml  Net -160 ml       Physical Exam: Vital Signs Blood pressure (!) 145/80, pulse 70, temperature (!) 97.5 F (36.4 C), temperature source Oral, resp. rate 18, height 5\' 10"  (1.778 m), weight 129.3 kg, SpO2 97 %.     General: No acute distress Mood and affect are appropriate Heart: Regular rate and rhythm no rubs murmurs or extra sounds Lungs: Clear to auscultation, breathing unlabored, no rales or wheezes Abdomen: Positive bowel sounds, soft nontender to palpation, nondistended Extremities: No clubbing, cyanosis, or edema Skin: No evidence of breakdown, no evidence of rash   Motor 3- RUE and RLE 5/5 on left  Musculoskeletal: Full range of motion in all 4 extremities. No joint swelling    Assessment/Plan: 1. Functional deficits secondary to Left pontine infarct  which require 3+ hours per day of interdisciplinary therapy in a comprehensive inpatient rehab setting.  Physiatrist is providing close team supervision and 24 hour management of active medical problems listed below.  Physiatrist and rehab team continue to assess barriers to discharge/monitor patient progress toward functional and medical goals  Care Tool:  Bathing  Bathing activity did not occur:  (N/A) Body parts bathed by patient: Right arm, Chest, Abdomen, Front perineal area, Face, Right upper leg, Left upper leg   Body parts bathed by helper: Left arm, Buttocks, Right lower leg, Left lower leg      Bathing assist Assist Level: Moderate Assistance - Patient 50 - 74%     Upper Body Dressing/Undressing Upper body dressing   What is the patient wearing?: Pull over shirt    Upper body assist Assist Level: Moderate Assistance - Patient 50 - 74%    Lower Body Dressing/Undressing Lower body dressing      What is the patient wearing?: Incontinence brief, Pants     Lower body assist Assist for lower body dressing: 2 Helpers     Toileting Toileting    Toileting assist Assist for toileting: 2 Helpers Assistive Device Comment: Bariatric BSC   Transfers Chair/bed transfer  Transfers assist  Chair/bed transfer activity did not occur: N/A  Chair/bed transfer assist level: Dependent - mechanical lift     Locomotion Ambulation   Ambulation assist   Ambulation activity did not occur: N/A  Assist level: 2 helpers Assistive device: Walker-rolling Max distance: 25   Walk 10 feet activity   Assist  Walk 10 feet activity did not occur: N/A  Assist level: 2 helpers Assistive device: Walker-rolling   Walk 50 feet activity   Assist Walk 50 feet with 2 turns activity did not occur: N/A         Walk 150 feet activity   Assist Walk 150 feet activity did not occur: N/A         Walk 10 feet on uneven surface  activity   Assist Walk 10 feet on uneven surfaces activity did not occur: N/A         Wheelchair  Assist Will patient use wheelchair at discharge?: No (Per PT goals )      Wheelchair assist level: Moderate Assistance - Patient 50 - 74% Max wheelchair distance: 150    Wheelchair 50 feet with 2 turns activity    Assist        Assist Level: Moderate Assistance - Patient 50 - 74%   Wheelchair 150 feet activity     Assist      Assist Level: Moderate Assistance - Patient 50 - 74%   Blood pressure (!) 145/80, pulse 70, temperature (!) 97.5 F (36.4 C), temperature source Oral, resp. rate 18, height 5\' 10"  (1.778 m),  weight 129.3 kg, SpO2 97 %.  Medical Problem List and Plan: 1.  Right hemiparesis secondary to left pontine infarction.  Follow-up outpatient cardiology services to discuss loop recorder             -patient may shower             -ELOS/Goals: 10/12/supervision/min a             Continue CIR PT, OT,SLP  2.  Antithrombotics: -DVT/anticoagulation: SCDs             -antiplatelet therapy: Aspirin 325 mg daily 3. Pain Management: Tylenol as needed. Well controlled.  4. Mood: Provide emotional support             -antipsychotic agents: N/A 5. Neuropsych: This patient is capable of making decisions on his own behalf. 6. Skin/Wound Care: Routine skin checks 7. Fluids/Electrolytes/Nutrition: Routine in and outs.  CMP ordered for tomorrow. 8.  Hypertension.  Norvasc 10 mg daily, Cozaar 100 mg daily.           well controlled 9/26  Vitals:   06/25/20 1926 06/26/20 0556  BP: (!) 150/68 (!) 145/80  Pulse: 72 70  Resp: 16 18  Temp: (!) 97.5 F (36.4 C) (!) 97.5 F (36.4 C)  SpO2: 98% 97%  9.  Diabetes mellitus with hyperglycemia.  Hemoglobin A1c 7.5.  SSI.          CBG (last 3)  Recent Labs    06/25/20 1633 06/25/20 2118 06/26/20 0553  GLUCAP 191* 171* 146*  fair to borderline control 9/27 10.  BPH.  Proscar.               emptying well 11.  Hyperlipidemia.  Lipitor--discussed rationale for statin after stroke and in general. He wishes to hold regardless 12.  History of gout.  Zyloprim 300 mg daily.  Monitor for any gout flareups 13.  History of non-Hodgkin's lymphoma status post chemotherapy.  Follow-up Dr Randa Evens as outpatient. 14.  Morbid obesity.  BMI 40.89.  Dietary follow-up 15.  History of rectal outlet bleeding.  Felt to be related to possible hemorrhoids.  Hemoglobin hematocrit mains stable.  He was to follow-up outpatient gastroenterology services.  Patient had deferred GI work-up while in the hospital. Hgb low nl 16. Constipation improved with laxatives: emptying  regularly now  - no gross bleeding , repeat CBC normal 9/22    LOS: 12 days A FACE TO [place] 06/26/2020, 7:44 AM

## 2020-06-26 NOTE — Progress Notes (Signed)
Physical Therapy Session Note  Patient Details  Name: Peter Ryan MRN: [id number] Date of Birth: [birth year]  Today's Date: 06/26/2020 PT Individual Time: 5284-1324 and 1455-1545  PT Individual Time Calculation (min): 27 min and 50 min  Short Term Goals: Week 2:  PT Short Term Goal 1 (Week 2): Pt will ambulate 70ft with mod assist and LRAD PT Short Term Goal 2 (Week 2): Pt will perform bed mobility with min assist PT Short Term Goal 3 (Week 2): Pt will transfer to and from [facility] with mod assist PT Short Term Goal 4 (Week 2): Pt will propell WC 141ft with min assist  Skilled Therapeutic Interventions/Progress Updates: Pt presented in bed agreeable to therapy. Pt denies pain during session. Session focused on sitting balance and transfers. Pt performed supine to sit at EOB with modA and verbal cues for trunk activation. Pt noted to initially have posterior lean upon sitting and was able to achieve midline with mod multimodal cues. PTA threaded pants and donned shoes total A for time management. Pt then performed stand from slightly elevated bed with modA x 2 however noted that brief was soiled with urine. In standing pt required cues for improved wt shifting to L and to activate quad for improved knee extension in R. With fatigue pt noted to have posterior lean and ultimately required seated rest on bed while PTA completed changing brief. Pt performed another stand with minA x 2 and required increased time to achieve midline. PTA completed clothing management and pt returned to sitting. Pt agreeable to trial recliner to increase time OOB. PTA was able to obtain recliner and pt performed STS minA x 2 and performed stand pivot transfer to recliner with modA x 2. Pt required mod multimodal cues for sequencing as well as assist from PTA to improve alignment of R foot when performing transfer. Once in recliner pt set up with belt alarm, and call bell and left with current needs met.   Tx2: Pt presented in  recliner with wife present agreeable to therapy. Pt denies pain during session. Session focused on gait training and standing balance. Pt performed STS from recliner with modA and performed stand pivot to w/c with modA x 1. Pt required multimodal cues for sequencing and to maintain R knee extension in stance phase. Pt transported to rehab gym for energy conservation and performed stand pivot to mat in same manner. Pt then participated in gait training with RW and mirror in front of pt for visual feedback for erect posture and maintaining midline. Pt was able to ambulate 68ft and 66ft with RW and modA. Pt required PTA assist for adjusting R foot but was able to advance foot without assist. Pt required mod verbal cues for sequencing due to pt attempting to push RW too far in front or advancing LLE when not in safe positioning. Pt also required assist for correcting RW as pt would drift to R consistently. Once completed pt participated in STS from w/c and kicking yoga block to assist with gait training. With each kick pt required increased time and verbal cues correct placement of RLE as pt with decreased coordination and creating narrow BOS. Pt then participated in w/c propulsion x 59ft requiring mod A due to poor coordination and sequencing with pt only attempting to use BUE without LLE despite instruction. Pt transported remaining distance to room and performed stand pivot transfer to recliner with modA. Pt repositioned to comfort and left with call bell within reach and needs met.  Therapy Documentation Precautions:  Precautions Precautions: Fall, Other (comment) Precaution Comments: R hemiparesis Restrictions Weight Bearing Restrictions: No Other Position/Activity Restrictions: protect weak RUE General:   Vital Signs: Therapy Vitals Temp: 98.2 F (36.8 C) Pulse Rate: 75 Resp: 17 BP: (!) 154/67 Patient Position (if appropriate): Lying Oxygen Therapy SpO2: 95 % O2 Device: Room  Air Pain: Pain Assessment Pain Scale: 0-10 Pain Score: 0-No pain   Therapy/Group: Individual Therapy  Andreyah Natividad  Jesenya Bowditch, PTA  06/26/2020, 4:23 PM

## 2020-06-27 ENCOUNTER — Inpatient Hospital Stay (HOSPITAL_COMMUNITY): Payer: Medicare Other | Admitting: Speech Pathology

## 2020-06-27 ENCOUNTER — Inpatient Hospital Stay (HOSPITAL_COMMUNITY): Payer: Medicare Other

## 2020-06-27 ENCOUNTER — Inpatient Hospital Stay (HOSPITAL_COMMUNITY): Payer: Medicare Other | Admitting: Occupational Therapy

## 2020-06-27 LAB — URINE CULTURE: Culture: <10000 — AB

## 2020-06-27 LAB — GLUCOSE, CAPILLARY
Glucose-Capillary: 164 mg/dL — ABNORMAL HIGH (ref 70–99)
Glucose-Capillary: 143 mg/dL — ABNORMAL HIGH (ref 70–99)
Glucose-Capillary: 186 mg/dL — ABNORMAL HIGH (ref 70–99)
Glucose-Capillary: 173 mg/dL — ABNORMAL HIGH (ref 70–99)

## 2020-06-27 NOTE — Progress Notes (Signed)
Physical Therapy Session Note  Patient Details  Name: Peter Ryan MRN: [id number] Date of Birth: 1951/02/06  Today's Date: 06/27/2020 PT Individual Time: 1105-1203 PT Individual Time Calculation (min): 58 min   Short Term Goals: Week 2:  PT Short Term Goal 1 (Week 2): Pt will ambulate 69f with mod assist and LRAD PT Short Term Goal 2 (Week 2): Pt will perform bed mobility with min assist PT Short Term Goal 3 (Week 2): [facility] Short Term Goal 4 (Week 2): Pt will propell WC 103fwith min assist  Skilled Therapeutic Interventions/Progress Updates: Pt presented in bed agreeable to therapy. Pt denies pain during session. Pt performed bed mobility with minA and max verbal cues for sequencing. Pt requires cues to engage trunk muscles vs pulling self with use of footboard to achieve midline. Pt then performed stand pivot to w/c with modA and improved STS transition. Pt transported to day room and participated in threadmill activities. Participated in two 3:30 bouts then third bout of 3 min at 0.4-0.96m22mrespectively with decreased body weight for forced use of RLE and faciliation of timing. First trial 3:30 with PTA providing 80% assistance of advancing RLE and max multimodal cues for increased quad activation in stance phase as well as assistance of R DF assist in swing through. Pt ambulated total 124f5fen required extended rest break due to fatigue. On second bout pt ambulated 123ft2f3:30 at 0.4mph 32mh pt able to demonstrate increased advancement of RLE  Initially then required increased assistance with fatigue. On third bout pt ambulated at 0.96mph a496mdecreased assistance from PTA and pt demonstrating fair carryover initially with improved timing of RLE in stance phase and fair advancement until becoming fatigued. Once completed pt returned to w/c and transported back to room due to fatigue. With rest pt was able to perform stand pivot w/c to recliner  however required heavy mod A to engage extensors and step back towards chair. Pt agreeable to remain in recliner until next session (OT at 2:15) then return to bed. Pt left in chair with call bell within reach and current needs met.      Therapy Documentation Precautions:  Precautions Precautions: Fall, Other (comment) Precaution Comments: R hemiparesis Restrictions Weight Bearing Restrictions: No Other Position/Activity Restrictions: protect weak RUE General:   Vital Signs:  Pain: Pain Assessment Pain Scale: 0-10 Pain Score: 0-No pain Mobility:   Locomotion :    Trunk/Postural Assessment :    Balance:   Exercises:   Other Treatments:      Therapy/Group: Individual Therapy  Melanie Openshaw 06/27/2020, 1:00 PM

## 2020-06-27 NOTE — Progress Notes (Signed)
[place] PHYSICAL MEDICINE & REHABILITATION PROGRESS NOTE   Subjective/Complaints:  No issues overnite, needs to have BM this am , no pains , no bladder incont   ROS: Patient denies CP, SOB, N/V/D   Objective:   No results found. No results for input(s): WBC, HGB, HCT, PLT in the last 72 hours. No results for input(s): NA, K, CL, CO2, GLUCOSE, BUN, CREATININE, CALCIUM in the last 72 hours.  Intake/Output Summary (Last 24 hours) at 06/27/2020 0749 Last data filed at 06/27/2020 0500 Gross per 24 hour  Intake 820 ml  Output 500 ml  Net 320 ml       Physical Exam: Vital Signs Blood pressure (!) 151/75, pulse 69, temperature 97.6 F (36.4 C), resp. rate 16, height 5\' 10"  (1.778 m), weight 129.3 kg, SpO2 95 %.  General: No acute distress Mood and affect are appropriate Heart: Regular rate and rhythm no rubs murmurs or extra sounds Lungs: Clear to auscultation, breathing unlabored, no rales or wheezes Abdomen: Positive bowel sounds, soft nontender to palpation, nondistended Extremities: No clubbing, cyanosis, or edema Skin: No evidence of breakdown, no evidence of rash  Motor 3- RUE and RLE,  5/5 on left  Musculoskeletal: Full range of motion in all 4 extremities. No joint swelling    Assessment/Plan: 1. Functional deficits secondary to Left pontine infarct  which require 3+ hours per day of interdisciplinary therapy in a comprehensive inpatient rehab setting.  Physiatrist is providing close team supervision and 24 hour management of active medical problems listed below.  Physiatrist and rehab team continue to assess barriers to discharge/monitor patient progress toward functional and medical goals  Care Tool:  Bathing  Bathing activity did not occur:  (N/A) Body parts bathed by patient: Right arm, Chest, Abdomen, Front perineal area, Face, Right upper leg, Left upper leg   Body parts bathed by helper: Left arm, Buttocks, Right lower leg, Left lower leg     Bathing  assist Assist Level: Moderate Assistance - Patient 50 - 74%     Upper Body Dressing/Undressing Upper body dressing   What is the patient wearing?: Pull over shirt    Upper body assist Assist Level: Minimal Assistance - Patient > 75%    Lower Body Dressing/Undressing Lower body dressing      What is the patient wearing?: Incontinence brief, Pants     Lower body assist Assist for lower body dressing: Maximal Assistance - Patient 25 - 49%     Toileting Toileting    Toileting assist Assist for toileting: Dependent - Patient 0% (using Stedy +1 assist) Assistive Device Comment: Bariatric BSC   Transfers Chair/bed transfer  Transfers assist  Chair/bed transfer activity did not occur: N/A  Chair/bed transfer assist level: Dependent - mechanical lift     Locomotion Ambulation   Ambulation assist   Ambulation activity did not occur: N/A  Assist level: 2 helpers Assistive device: Walker-rolling Max distance: 25   Walk 10 feet activity   Assist  Walk 10 feet activity did not occur: N/A  Assist level: 2 helpers Assistive device: Walker-rolling   Walk 50 feet activity   Assist Walk 50 feet with 2 turns activity did not occur: N/A         Walk 150 feet activity   Assist Walk 150 feet activity did not occur: N/A         Walk 10 feet on uneven surface  activity   Assist Walk 10 feet on uneven surfaces activity did not occur: N/A  Wheelchair     Assist Will patient use wheelchair at discharge?: No (Per PT goals )      Wheelchair assist level: Moderate Assistance - Patient 50 - 74% Max wheelchair distance: 150    Wheelchair 50 feet with 2 turns activity    Assist        Assist Level: Moderate Assistance - Patient 50 - 74%   Wheelchair 150 feet activity     Assist      Assist Level: Moderate Assistance - Patient 50 - 74%   Blood pressure (!) 151/75, pulse 69, temperature 97.6 F (36.4 C), resp. rate 16, height  5\' 10"  (1.778 m), weight 129.3 kg, SpO2 95 %.  Medical Problem List and Plan: 1.  Right hemiparesis secondary to left pontine infarction.  Follow-up outpatient cardiology services to discuss loop recorder             -patient may shower             -ELOS/Goals: 10/12/supervision/min a             Continue CIR PT, OT,SLP  2.  Antithrombotics: -DVT/anticoagulation: SCDs             -antiplatelet therapy: Aspirin 325 mg daily 3. Pain Management: Tylenol as needed. Well controlled.  4. Mood: Provide emotional support             -antipsychotic agents: N/A 5. Neuropsych: This patient is capable of making decisions on his own behalf. 6. Skin/Wound Care: Routine skin checks 7. Fluids/Electrolytes/Nutrition: Routine in and outs.  CMP ordered for tomorrow. 8.  Hypertension.  Norvasc 10 mg daily, Cozaar 100 mg daily.        mild elevation of systolic will monitor prior to med changes  Vitals:   06/26/20 1931 06/27/20 0435  BP: (!) 147/76 (!) 151/75  Pulse: 72 69  Resp: 16 16  Temp: 98.1 F (36.7 C) 97.6 F (36.4 C)  SpO2: 98% 95%  9.  Diabetes mellitus with hyperglycemia.  Hemoglobin A1c 7.5.  SSI.          CBG (last 3)  Recent Labs    06/26/20 1640 06/26/20 2114 06/27/20 0630  GLUCAP 182* 162* 164*  fair to borderline control 9/28 10.  BPH.  Proscar.               emptying well 11.  Hyperlipidemia.  Lipitor--discussed rationale for statin after stroke and in general. He wishes to hold regardless 12.  History of gout.  Zyloprim 300 mg daily.  Monitor for any gout flareups 13.  History of non-Hodgkin's lymphoma status post chemotherapy.  Follow-up Dr Randa Evens as outpatient. 14.  Morbid obesity.  BMI 40.89.  Dietary follow-up 15.  History of rectal outlet bleeding.  Felt to be related to possible hemorrhoids.  Hemoglobin hematocrit mains stable.  He was to follow-up outpatient gastroenterology services.  Patient had deferred GI work-up while in the hospital. Hgb low nl 16.  Constipation improved with laxatives: emptying regularly now  - no gross bleeding , repeat CBC normal 9/22    LOS: 13 days A FACE TO FACE EVALUATION WAS PERFORMED  Charlett Blake 06/27/2020, 7:49 AM

## 2020-06-27 NOTE — Progress Notes (Signed)
Occupational Therapy Session Note  Patient Details  Name: Peter Ryan MRN: [id number] Date of Birth: Sep 20, 1951  Today's Date: 06/27/2020 OT Individual Time: 7159-5396 OT Individual Time Calculation (min): 77 min    Short Term Goals: Week 2:  OT Short Term Goal 1 (Week 2): Patient will thread LLE in LB clothing seated EOB. OT Short Term Goal 2 (Week 2): Patient will complete functional transfers with LRAD and Mod A +1. OT Short Term Goal 3 (Week 2): Patient will incorporate RUE into 1/3 grooming tasks with Min A.  Skilled Therapeutic Interventions/Progress Updates:    Patient in bed, alert and ready for therapy session.  Wife present and remained in room.  Patient denies pain.  Supine to sitting edge of bed with mod A at trunk and right LE.  He is able to maintain unsupported sitting with CS.  Dependent to donn and tie shoes.  Sit to stand, short distance ambulation with RW (right hand assist) with min/mod A to/from bed, w/c, mat table.  Completed seated activities with focus on trunk mobility, core strengthening, balance, posture, scapular stability, functional reach, NMRE R UE/LE, positioning, weight bearing.  Supine proximal control activities with focus on shoulder and scapular mobility, elbow control.  Completed standing weight shift and stepping activity to engage right with WB through both UE & LE.  Completed hand mobility and dexterity activities.  He tolerated all activities well - note fatigue toward end of session.  Returned to bed at close of session, mod A sit to supine and positioning in bed.  He was able to tell wife about session.  Bed alarm set and call bell in hand.    Therapy Documentation Precautions:  Precautions Precautions: Fall, Other (comment) Precaution Comments: R hemiparesis Restrictions Weight Bearing Restrictions: No Other Position/Activity Restrictions: protect weak RUE   Therapy/Group: Individual Therapy  Carlos Levering 06/27/2020, 7:39 AM

## 2020-06-27 NOTE — Progress Notes (Signed)
Speech Language Pathology Discharge Summary ° °Patient Details  °Name: Peter Ryan °MRN: [id number] °Date of Birth: 02/08/1951 ° °Today's Date: 06/27/2020 °SLP Individual Time: 0930-1025 °SLP Individual Time Calculation (min): 55 min ° ° °Skilled Therapeutic Interventions:  Pt was seen for skilled ST targeting cognitive skills and finishing education with pt and family. SLP readministered Cognistat cognitive evaluation as post-test for this admission. He scored WFL for all subtests except for mild impairment noted in short term recall test (was a moderate impairment score on admission in addition to deficits in problem solving and awareness). Pt anticipated d/c needs and ADLs would need assistance with with Supervision A question cues and was Mod I for use of a strategy for problem solving during mildly complex problem solving tasks. All questions answered to pt and his wife's satisfaction. Given pt is at goal level and cognitive baseline, not further ST is indicated at this time. With d/c from ST today. Pt left in bed with alarm set and needs within reach, wife still present.   °  ° ° ° °Patient has met 6 of 6 long term goals.  Patient to discharge at overall Modified Independent;Supervision level.  °Reasons goals not met: n/a  ° °Clinical Impression/Discharge Summary:   Pt made functional gains and met 6 out of 6 long term goals this admission. Pt is currently Mod I-Supervision assist for mildly complex tasks and use of speech intelligibility strategies due to mild dysarthria and cognitive impairments impacting recall, problem solving, and anticipatory awareness. Pt and his wife endorse that pt is back to cognitive baseline. He would benefit from intermittent supervision for safety at discharge, primarily due to awareness impairment. Pt is consuming regular texture diet with thin liquids and is Mod I for use of compensatory swallow strategies to minimize aspiration risk of thins and oral clearance of solids. Pt  has demonstrated improved overall cognitive-linguistic function including short term recall, problem solving, awareness, processing speed, speech intelligibility, and use of compensatory swallow strategies. Pt is 95-100% intelligible in conversation despite mild articularly imprecision due to right facial and lingual weakness. Given that he has reached his Mod I-Supervision A goal levels for ST and he is believed to be at cognitive baseline, no further skilled ST services are indicated at this time. Pt also expresses he would like to focus on PT/OT for rest of inpatient stay. No follow up services are indicated for ST.  Pt and family education is complete at this time.  °  °Care Partner:  °Caregiver Able to Provide Assistance: Yes  °Type of Caregiver Assistance: Cognitive;Physical ° °Recommendation:  °None;Other (comment) (intermittent supervision for safety)  °Rationale for SLP Follow Up: Other (comment) (n/a)  ° °Equipment: none  ° °Reasons for discharge: Treatment goals met  ° °Patient/Family Agrees with Progress Made and Goals Achieved: Yes  ° ° °Erin E Smith °06/27/2020, 12:21 PM ° ° ° °

## 2020-06-27 NOTE — Progress Notes (Signed)
Patient ID: Peter Ryan, male   DOB: [birth year], 69 y.o.   MRN: [id number]  TTB and BSC ordered through [facility].

## 2020-06-28 ENCOUNTER — Inpatient Hospital Stay (HOSPITAL_COMMUNITY): Payer: Medicare Other

## 2020-06-28 ENCOUNTER — Inpatient Hospital Stay (HOSPITAL_COMMUNITY): Payer: Medicare Other | Admitting: Occupational Therapy

## 2020-06-28 ENCOUNTER — Ambulatory Visit: Payer: Medicare Other

## 2020-06-28 LAB — GLUCOSE, CAPILLARY
Glucose-Capillary: 152 mg/dL — ABNORMAL HIGH (ref 70–99)
Glucose-Capillary: 140 mg/dL — ABNORMAL HIGH (ref 70–99)
Glucose-Capillary: 99 mg/dL (ref 70–99)
Glucose-Capillary: 156 mg/dL — ABNORMAL HIGH (ref 70–99)

## 2020-06-28 MED ORDER — GLIMEPIRIDE 2 MG PO TABS
2.0000 mg | ORAL_TABLET | Freq: Every day | ORAL | Status: DC
  Administered 2020-06-28 – 2020-07-11 (×14): 2 mg via ORAL
  Filled 2020-06-28 (×15): qty 1

## 2020-06-28 NOTE — Progress Notes (Signed)
Occupational Therapy Session Note  Patient Details  Name: Peter Ryan MRN: [id number] Date of Birth: 09/05/1951  Today's Date: 06/28/2020 OT Individual Time: 3491-7915 OT Individual Time Calculation (min): 81 min    Short Term Goals: Week 2:  OT Short Term Goal 1 (Week 2): Patient will thread LLE in LB clothing seated EOB. OT Short Term Goal 2 (Week 2): Patient will complete functional transfers with LRAD and Mod A +1. OT Short Term Goal 3 (Week 2): Patient will incorporate RUE into 1/3 grooming tasks with Min A.  Skilled Therapeutic Interventions/Progress Updates:   Pt received supine with no c/o pain. Pt agreeable to OT session. Pt declining full ADLs. Pt completed bed mobility to EOB with mod A. Pt doffed shirt and donned new shirt with CGA- good use of hemi technique. Pt completed sit > stand with min cueing for hand placement and min A. Min A for stand pivot transfer to the w/c. Pt was taken to the therapy gym via w/c. Pt completed 5 steps of functional mobility to the mat with use of RW with min-mod A. Pt transitioned to supine with mod lifting assistance for BLE. Pt completed scapular protraction/retraction exercises with resistance provided via flat hand on overhead slideboard 2x 10 repetitions. Pt completed resistive IR/ER at the R shoulder 2x 10 repetitions. Pt completed 3x 10 hip bridges with ball placed between knees to activate hip adductors and provide tactile cueing for more neutral R hip alignment. 3x10 chest press with 4 lb dowel completed bimanually with min facilitation for R arm extension. Cueing provided for technique to roll to the R and mod A to come into sitting. Pt was guided through manual facilitation/stretching of the B pec's to increase upright posture. Pt completed sit > stand from the mat and completed reciprocal stepping activity. Mod-max A required for standing balance support when stepping with the LLE, and min A when stepping with the RLE. Pt returned to his w/c  via stand pivot transfer with min A. He was taken back to his room and left sitting up with all needs met.   Therapy Documentation Precautions:  Precautions Precautions: Fall, Other (comment) Precaution Comments: R hemiparesis Restrictions Weight Bearing Restrictions: No Other Position/Activity Restrictions: protect weak RUE   Therapy/Group: Individual Therapy  Curtis Sites 06/28/2020, 7:03 AM

## 2020-06-28 NOTE — Progress Notes (Signed)
[facility] PHYSICAL MEDICINE & REHABILITATION PROGRESS NOTE   Subjective/Complaints:  No issues overnite , discussed CBG had diarrhea with metformin in past , discussed glimipride, has not tried before   ROS: Patient denies CP, SOB, N/V/D   Objective:   No results found. No results for input(s): WBC, HGB, HCT, PLT in the last 72 hours. No results for input(s): NA, K, CL, CO2, GLUCOSE, BUN, CREATININE, CALCIUM in the last 72 hours.  Intake/Output Summary (Last 24 hours) at 06/28/2020 0830 Last data filed at 06/28/2020 0535 Gross per 24 hour  Intake 540 ml  Output 625 ml  Net -85 ml       Physical Exam: Vital Signs Blood pressure (!) 142/66, pulse 68, temperature 97.9 F (36.6 C), resp. rate 18, height _0  (1.778 m), weight 129.3 kg, SpO2 98 %.   General: No acute distress Mood and affect are appropriate Heart: Regular rate and rhythm no rubs murmurs or extra sounds Lungs: Clear to auscultation, breathing unlabored, no rales or wheezes Abdomen: Positive bowel sounds, soft nontender to palpation, nondistended Extremities: No clubbing, cyanosis, or edema Skin: No evidence of breakdown, no evidence of rash   Motor 3- RUE and RLE,  5/5 on left  Musculoskeletal: Full range of motion in all 4 extremities. No joint swelling    Assessment/Plan: 1. Functional deficits secondary to Left pontine infarct  which require 3+ hours per day of interdisciplinary therapy in a comprehensive inpatient rehab setting.  Physiatrist is providing close team supervision and 24 hour management of active medical problems listed below.  Physiatrist and rehab team continue to assess barriers to discharge/monitor patient progress toward functional and medical goals  Care Tool:  Bathing  Bathing activity did not occur:  (N/A) Body parts bathed by patient: Right arm, Chest, Abdomen, Front perineal area, Face, Right upper leg, Left upper leg   Body parts bathed by helper: Left arm, Buttocks,  Right lower leg, Left lower leg     Bathing assist Assist Level: Moderate Assistance - Patient 50 - 74%     Upper Body Dressing/Undressing Upper body dressing   What is the patient wearing?: Pull over shirt    Upper body assist Assist Level: Minimal Assistance - Patient > 75%    Lower Body Dressing/Undressing Lower body dressing      What is the patient wearing?: Incontinence brief, Pants     Lower body assist Assist for lower body dressing: Maximal Assistance - Patient 25 - 49%     Toileting Toileting    Toileting assist Assist for toileting: Dependent - Patient 0% (using Stedy +1 assist) Assistive Device Comment: Bariatric BSC   Transfers Chair/bed transfer  Transfers assist  Chair/bed transfer activity did not occur: N/A  Chair/bed transfer assist level: Dependent - mechanical lift     Locomotion Ambulation   Ambulation assist   Ambulation activity did not occur: N/A  Assist level: 2 helpers Assistive device: Walker-rolling Max distance: 25   Walk 10 feet activity   Assist  Walk 10 feet activity did not occur: N/A  Assist level: 2 helpers Assistive device: Walker-rolling   Walk 50 feet activity   Assist Walk 50 feet with 2 turns activity did not occur: N/A         Walk 150 feet activity   Assist Walk 150 feet activity did not occur: N/A         Walk 10 feet on uneven surface  activity   Assist Walk 10 feet on uneven surfaces  activity did not occur: N/A         Wheelchair     Assist Will patient use wheelchair at discharge?: No (Per PT goals )      Wheelchair assist level: Moderate Assistance - Patient 50 - 74% Max wheelchair distance: 150    Wheelchair 50 feet with 2 turns activity    Assist        Assist Level: Moderate Assistance - Patient 50 - 74%   Wheelchair 150 feet activity     Assist      Assist Level: Moderate Assistance - Patient 50 - 74%   Blood pressure (!) 142/66, pulse 68,  temperature 97.9 F (36.6 C), resp. rate 18, height _0  (1.778 m), weight 129.3 kg, SpO2 98 %.  Medical Problem List and Plan: 1.  Right hemiparesis secondary to left pontine infarction.  Follow-up outpatient cardiology services to discuss loop recorder             -patient may shower             -ELOS/Goals: 10/12/supervision/min a             Continue CIR PT, OT,SLP  Team conference today please see physician documentation under team conference tab, met with team  to discuss problems,progress, and goals. Formulized individual treatment plan based on medical history, underlying problem and comorbidities.  2.  Antithrombotics: -DVT/anticoagulation: SCDs             -antiplatelet therapy: Aspirin 325 mg daily 3. Pain Management: Tylenol as needed. Well controlled.  4. Mood: Provide emotional support             -antipsychotic agents: N/A 5. Neuropsych: This patient is capable of making decisions on his own behalf. 6. Skin/Wound Care: Routine skin checks 7. Fluids/Electrolytes/Nutrition: Routine in and outs.  CMP ordered for tomorrow. 8.  Hypertension.  Norvasc 10 mg daily, Cozaar 100 mg daily.        mild elevation of systolic will monitor prior to med changes  Vitals:   06/27/20 1958 06/28/20 0502  BP: 140/65 (!) 142/66  Pulse: 69 68  Resp: 18 18  Temp: 97.9 F (36.6 C) 97.9 F (36.6 C)  SpO2: 96% 98%  9.  Diabetes mellitus with hyperglycemia.  Hemoglobin A1c 7.5.  SSI.          CBG (last 3)  Recent Labs    06/27/20 1642 06/27/20 2113 06/28/20 0601  GLUCAP 186* 173* 152*  fair to borderline control 9/29- stomach upset with metformin, may try low dose glimipride 10.  BPH.  Proscar.               emptying well 11.  Hyperlipidemia.  Lipitor--discussed rationale for statin after stroke and in general. He wishes to hold regardless 12.  History of gout.  Zyloprim 300 mg daily.  Monitor for any gout flareups 13.  History of non-Hodgkin's lymphoma status post chemotherapy.   Follow-up Dr Randa Evens as outpatient. 14.  Morbid obesity.  BMI 40.89.  Dietary follow-up 15.  History of rectal outlet bleeding.  Felt to be related to possible hemorrhoids.  Hemoglobin hematocrit mains stable.  He was to follow-up outpatient gastroenterology services.  Patient had deferred GI work-up while in the hospital. Hgb low nl 16. Constipation improved with laxatives: emptying regularly now     LOS: 14 days A FACE TO FACE EVALUATION WAS PERFORMED  Charlett Blake 06/28/2020, 8:30 AM

## 2020-06-28 NOTE — Progress Notes (Addendum)
Pt c/o back pain at this time. This nurse administered prn tylenol. Pt also c/o upset stomach, no PRN orders in place contacted on call and pt then stated that was ok at that time and stated he did not need anything. On call made aware. No other problems to report at this time.

## 2020-06-28 NOTE — Patient Care Conference (Signed)
Inpatient RehabilitationTeam Conference and Plan of Care Update Date: 06/28/2020   Time: 10:06 AM    Patient Name: Peter Ryan      Medical Record Number: [id number]  Date of Birth: 03-Sep-1951 Sex: Male         Room/Bed: 4W23C/4W23C-01 Payor Info: Payor: MEDICARE / Plan: MEDICARE PART A AND B / Product Type: *No Product type* /    Admit Date/Time:  06/14/2020  1:20 PM  Primary Diagnosis:  Left pontine cerebrovascular accident [facility])  Hospital Problems: Principal Problem:   Left pontine cerebrovascular accident [facility])    Expected Discharge Date: Expected Discharge Date: 07/11/20  Team Members Present: Physician leading conference: Dr. Alysia Penna Care Coodinator Present: Dorien Chihuahua, RN, BSN, CRRN;Christina Trona, [place] Nurse Present: Rayne Du, LPN PT Present: Phylliss Bob, PTA OT Present: Laverle Hobby, OT SLP Present: Jettie Booze, CF-SLP PPS Coordinator present : Gunnar Fusi, SLP     Current Status/Progress Goal Weekly Team Focus  Bowel/Bladder   pt is cont of b and b, lbm 06/26/20  remain coont of b and b lbm 06/26/20  assess q shift and prn   Swallow/Nutrition/ Hydration   Mod I  Mod I  goals met, d/c from ST 06/27/20   ADL's   Mod A bathing at shower level using lateral leans, Min A UB dressing, Max-Total LB dressing sit<stand with RW, Dependent toileting + bathroom transfers using Stedy lift  Min A overall  Rt NMR, postural control in sitting/standing, functional transfers, sit<stands with RW, OOB tolerance   Mobility   modA  bed mobility, modA stand pivot transfers, modA gait with RW max 58f, continues to demonstrate R posteriorlateral lean in sitting can correct with verbal cues. MinA w/c mobility  minA overall  R NMR, balance, gait, w/c mobility   Communication   Mod I conversation  Mod I  goals met, d/c from ST 06/27/20   Safety/Cognition/ Behavioral Observations  Mod I-Supervision A, pt and wife confirm now at baseline  Mod I - Supervision   goals met, d/c from ST 06/27/20   Pain   pt has no complaints of pain at this time  remain pain free  assess q shift and prn   Skin   no current skin issues to report  remain free of skin breakdown  Assess q shift and prn     Discharge Planning:  Goal to d/c Min A, spouse avaliable 24/7. 3 steps to enter, R side railings   Team Discussion: CBG elevated; MD to add oral agent  (glimepiride) for better control.  Continent of bowel and bladder. Patient struggles with coordination and right inattention, drags foot with ambulation.  Patient on target to meet rehab goals: yes, min-mod assist goals  *See Care Plan and promin=gress notes for long and short-term goals.   Revisions to Treatment Plan:  Gait trials, and treadmill therapy SLP discontinued 06/27/20 Teaching Needs: Transfers, gait, toileting, medications, DM control, etc.  Current Barriers to Discharge:   Possible Resolutions to Barriers: Family education     Medical Summary Current Status: Poorly controlled blood sugars, history of Metformin intolerance, blood pressures improving but still with lability  Barriers to Discharge: Medical stability   Possible Resolutions to BCel[employer] CorporationFocus: Trial of glimepiride, may need additional blood pressure medication adjustment   Continued Need for Acute Rehabilitation Level of Care: The patient requires daily medical management by a physician with specialized training in physical medicine and rehabilitation for the following reasons: Direction of a multidisciplinary physical rehabilitation program to  maximize functional independence : Yes Medical management of patient stability for increased activity during participation in an intensive rehabilitation regime.: Yes Analysis of laboratory values and/or radiology reports with any subsequent need for medication adjustment and/or medical intervention. : Yes   I attest that I was present, lead the team conference, and concur with the  assessment and plan of the team.   Dorien Chihuahua B 06/28/2020, 12:52 PM

## 2020-06-28 NOTE — Progress Notes (Signed)
Patient ID: Peter Ryan, male   DOB: [birth year], 69 y.o.   MRN: [id number] Team Conference Report to Patient/Family  Team Conference discussion was reviewed with the patient and caregiver, including goals, any changes in plan of care and target discharge date.  Patient and caregiver express understanding and are in agreement.  The patient has a target discharge date of 07/11/20.  Dyanne Iha 06/28/2020, 12:26 PM

## 2020-06-28 NOTE — Progress Notes (Signed)
Physical Therapy Session Note  Patient Details  Name: Peter Ryan MRN: [id number] Date of Birth: 28-Sep-1951  Today's Date: 06/28/2020 PT Individual Time: 0109-3235 and 1350-1500 PT Individual Time Calculation (min): 45 min and 70 min  Short Term Goals: Week 2:  PT Short Term Goal 1 (Week 2): Pt will ambulate 83ft with mod assist and LRAD PT Short Term Goal 2 (Week 2): Pt will perform bed mobility with min assist PT Short Term Goal 3 (Week 2): Pt will transfer to and from [facility] with mod assist PT Short Term Goal 4 (Week 2): Pt will propell WC 153ft with min assist  Skilled Therapeutic Interventions/Progress Updates: Pt presented in recliner agreeable to therapy. Pt states he had an episode of dizziness earlier and states he feels "fuzzy", BP checked 149/71 (94) HR 72. Pt performed STS from recliner with minA and minA stand pivot transfer. Pt transported to day room and performed stand pivot in same manner to NuStep. Participated in NuStep L2 x 7 min for reciprocal activity and general conditioning. Pt states 8/10 on mBorg during activity. Pt required verbal cues to improve R hip to more neutral positioning to avoid RLE hitting NuStep. PTA also used theraband to provide feedback to improve neutral hip. Once completed pt performed stand pivot back to w/c and participated w/c propulsion throughout day room. Pt required initially min to modA due to decreased coordination. PTA providing assistance for coordinating LLE with pt demonstrating minimal carryover. Pt transported back to room and performed stand pivot to bed requiring modA due to increased cues for sequencing. Pt performed sit to supine with mindA for BLE management. Pt repositioned to comfort and left with bed alarm on, call bell within reach and needs met.   Tx2: Pt presented in w/c agreeable to therapy. Pt denies pain during session. Pt transported to day room and participated in gait training with RW. Pt was able to ambulate 36ft, 67ft, and  16ft with RW and overall modA. Pt required mod cues for sequencing and to maintain within close proximity of RW. Pt required intermittnet minA for R foot placement and to improve erect posture. PTA placed toe cap on R shoe to faciltate advancement of RLE. Pt required increased time between bouts due to fatigue. Pt then performed stand pivot transfer to mat with minA for STS and modA for transfer. Pt participated in ball kicks with emphasis on hamstring activation (mat elevated to allow clearance). Pt then indicated urgent need for toilet. Pt performed stand pivot transfer to w/c with RW and minA. Pt transported back to room and performed stand pivot transfer to toilet with RW and minA with mod cues for sequencing. Pt required total A for LB clothing management (+urinary void only). Due to pt's fatigue Stedy transfer performed from toilet to bed. Once at EOB pt performed sit to supine with modA for BLE management. Pt repositioned to comfort and left with bed alarm on, call bell within reach and needs met.      Therapy Documentation Precautions:  Precautions Precautions: Fall, Other (comment) Precaution Comments: R hemiparesis Restrictions Weight Bearing Restrictions: No Other Position/Activity Restrictions: protect weak RUE General:   Vital Signs: Therapy Vitals Temp: 97.6 F (36.4 C) Pulse Rate: 71 Resp: 17 BP: (!) 146/74 Patient Position (if appropriate): Sitting Oxygen Therapy SpO2: 99 % O2 Device: Room Air   Therapy/Group: Individual Therapy  Shatima Zalar  Elea Holtzclaw, PTA  06/28/2020, 3:21 PM

## 2020-06-29 ENCOUNTER — Inpatient Hospital Stay (HOSPITAL_COMMUNITY): Payer: Medicare Other | Admitting: Occupational Therapy

## 2020-06-29 ENCOUNTER — Inpatient Hospital Stay (HOSPITAL_COMMUNITY): Payer: Medicare Other | Admitting: Physical Therapy

## 2020-06-29 LAB — GLUCOSE, CAPILLARY
Glucose-Capillary: 135 mg/dL — ABNORMAL HIGH (ref 70–99)
Glucose-Capillary: 130 mg/dL — ABNORMAL HIGH (ref 70–99)
Glucose-Capillary: 118 mg/dL — ABNORMAL HIGH (ref 70–99)
Glucose-Capillary: 144 mg/dL — ABNORMAL HIGH (ref 70–99)
Glucose-Capillary: 129 mg/dL — ABNORMAL HIGH (ref 70–99)

## 2020-06-29 NOTE — Progress Notes (Signed)
Physical Therapy Weekly Progress Note  Patient Details  Name: Peter Ryan MRN: [id number] Date of Birth: 1950/11/26  Beginning of progress report period: June 22, 2020 End of progress report period: June 29, 2020  Today's Date: 06/29/2020 PT Individual Time: 0800-0839 1610-1650 PT Individual Time Calculation (min): 39 min and 40 min   Patient has met 3 of 4 short term goals.  Pt is making steady progress to LTG of min assist overall. Over the past week pt has progressed with bed mobility to mod assist for supine>sit and min assist sit>supine. Min-mod assist sit<>stand tranfers and stand pivot transfer to and from [facility]. Ambulation has improved to min-mod assist from PT and UE support on RW with only mild pushers syndrom noted with fatigue.   Patient continues to demonstrate the following deficits muscle weakness and muscle joint tightness, decreased cardiorespiratoy endurance, unbalanced muscle activation, motor apraxia, decreased coordination and decreased motor planning and decreased midline orientation and decreased attention to right and therefore will continue to benefit from skilled PT intervention to increase functional independence with mobility.  Patient progressing toward long term goals..  Continue plan of care.  PT Short Term Goals Week 2:  PT Short Term Goal 1 (Week 2): Pt will ambulate 69f with mod assist and LRAD PT Short Term Goal 1 - Progress (Week 2): Met PT Short Term Goal 2 (Week 2): Pt will perform bed mobility with min assist PT Short Term Goal 2 - Progress (Week 2): Progressing toward goal PT Short Term Goal 3 (Week 2): [facility] Short Term Goal 3 - Progress (Week 2): Met PT Short Term Goal 4 (Week 2): Pt will propell WC 1052fwith min assist PT Short Term Goal 4 - Progress (Week 2): Met Week 3:  PT Short Term Goal 1 (Week 3): Pt will ambulate 5022fith min assist and LRAD PT Short Term Goal 2 (Week 3): Pt will  [facility] PT Short Term Goal 3 (Week 3): Pt will ascend 4 steps with BUE support and mod assist PT Short Term Goal 4 (Week 3): Pt will maintian balacne up to 3 min with min assist and LRAD  Skilled Therapeutic Interventions/Progress Updates:  Session 1  Pt received supine in bed and agreeable to PT, but reports moderate LPB. Supine>sit transfer with mod assist from PT with moderate cues for sequencing and use of RUE to push into sitting. Once EOB, pt reports decreased LPB. Sit<>stand x 45 from EOB throughout session with mod assist progressing to min assist and moderate cues for improved angterior weight shift.   Gait training in room x 24f48fth min-mod assist from PT, UE support on RW, and R hand splint. Moderate cues for sequencing, and AD management to improved safety and gait pattern. Side stepping R with RW and min assist overall from PT with moderate cues for sequencing and improved weight shift L to advance the RLE.    Sit>supine completed with min assist at RLE, and left supine in bed with call bell in reach and all needs met.    Session 2 Pt received supine in bed and agreeable to PT. Supine>sit transfer with mod assist and cues for UE placement, noted to have improved trunk control when pulling on PT into sitting position. Sit<>stand throughout session with min-mod assist and moderate cues for UE placement and adaquate anterior weight shift to prevent posterior LOB.   Pt performed stand pivot  transfer to [facility] with min assist for AD managment and step pattern. WC propulsion x 126f with min assist and cues for attention to the RUE throughout.   Gait training in day room x 350f+4069fith min assist overall from PT and moderate multimodal cues for AD management to improve RW position and proper weight shift L to advance the RLE with swing through.   Stair management training with BUE supported on Bil rails x 3 on 3" step with min assist overall and moderate  cues for awareness of RLE. Pt then performed x 3 on 6"step with mod assist and mod-max cues for RLE awareness due to mild knee instability on 3 trial; pt noted to use RUE for stability through elbow.   Patient returned to room and [facility] reach and all needs met.           Therapy Documentation Precautions:  Precautions Precautions: Fall, Other (comment) Precaution Comments: R hemiparesis Restrictions Weight Bearing Restrictions: No Other Position/Activity Restrictions: protect weak RUE Vital Signs: Therapy Vitals Temp: 98.4 F (36.9 C) Pulse Rate: 65 Resp: 18 BP: (!) 151/78 Patient Position (if appropriate): Lying Oxygen Therapy SpO2: 91 % Pain: Pain Assessment Pain Scale: 0-10 Pain Score: 3  Pain Type: Chronic pain Pain Location: Back Pain Descriptors / Indicators: Aching Pain Frequency: Constant Pain Intervention(s): Medication (See eMAR)   Therapy/Group: Individual Therapy  AusLorie Phenix30/2021, 8:40 AM

## 2020-06-29 NOTE — Progress Notes (Addendum)
Occupational Therapy Session Note  Patient Details  Name: Peter Ryan MRN: [id number] Date of Birth: 1951-04-11  Today's Date: 06/29/2020 OT Individual Time: 0950-1100 OT Individual Time Calculation (min): 70 min   OT Individual Time: 1419-1500 OT Individual Time Calculation (min): 41 min    Short Term Goals: Week 2:  OT Short Term Goal 1 (Week 2): Patient will thread LLE in LB clothing seated EOB. OT Short Term Goal 2 (Week 2): Patient will complete functional transfers with LRAD and Mod A +1. OT Short Term Goal 3 (Week 2): Patient will incorporate RUE into 1/3 grooming tasks with Min A.  Skilled Therapeutic Interventions/Progress Updates:  Session 1: Patient met lying supine in bed in agreement with OT treatment session with focus on self-care re-eduction, ADL transfers, and dynamic sitting balance as detailed below. Patient demonstrates improvements in sit to stand and stand pivot transfers this date with use of bariatric walker and verbal cues for sequencing. Patient completed walk-in shower transfer with Mod A via stand-pivot. Seated on bariatric BSC, patient was able to wash LUE with R hand and lift R arm to wash armpit without assist and fair dynamic sitting balance without cues for posture. Patient required assistance to wash buttocks 2/2 girth. Use of Stedy for transfer to EOB for safety. Patient able to don shirt with Mod A and able to thread LLE and RLE into pants seated EOB with Mod A +2 to hike pants over hips in standing. Patient continues to demonstrate poor frustration tolerance and often voices negative thoughts about progress toward goals. OT provided education on finding things that went well during tx sessions instead of focusing on what the patient isn't yet able to do. Session concluded with patient lying supine in bed with call bell within reach, bed alarm activated, and all needs met.   Session 2: Afternoon session with focus on RUE NMR with towel pushes. With fatigue,  patient requires cues for posture in sitting in wc at high/low table. Patient demonstrates increased gross grasp, composite extension of digits 1-5, and some slight isolated extension of 1st, 2nd, and 5th digits. Saebo e-stim applied to wrist/digit extensors for 15 minutes. Session concluded with patient seated in wc with call bell within reach and all needs met.   Therapy Documentation Precautions:  Precautions Precautions: Fall, Other (comment) Precaution Comments: R hemiparesis Restrictions Weight Bearing Restrictions: No Other Position/Activity Restrictions: protect weak RUE General:    Therapy/Group: Individual Therapy  Peter Ryan 06/29/2020, 7:52 AM

## 2020-06-29 NOTE — Progress Notes (Signed)
PHYSICAL MEDICINE & REHABILITATION PROGRESS NOTE   Subjective/Complaints:  No issues overnite   ROS: Patient denies CP, SOB, N/V/D   Objective:   No results found. No results for input(s): WBC, HGB, HCT, PLT in the last 72 hours. No results for input(s): NA, K, CL, CO2, GLUCOSE, BUN, CREATININE, CALCIUM in the last 72 hours.  Intake/Output Summary (Last 24 hours) at 06/29/2020 0811 Last data filed at 06/29/2020 0648 Gross per 24 hour  Intake 380 ml  Output 900 ml  Net -520 ml       Physical Exam: Vital Signs Blood pressure (!) 151/78, pulse 65, temperature 98.4 F (36.9 C), resp. rate 18, height 5\' 10"  (1.778 m), weight 129.3 kg, SpO2 91 %.  General: No acute distress Mood and affect are appropriate Heart: Regular rate and rhythm no rubs murmurs or extra sounds Lungs: Clear to auscultation, breathing unlabored, no rales or wheezes Abdomen: Positive bowel sounds, soft nontender to palpation, nondistended Extremities: No clubbing, cyanosis, or edema Skin: No evidence of breakdown, no evidence of rash  Motor 3- RUE and RLE,  5/5 on left  Musculoskeletal: Full range of motion in all 4 extremities. No joint swelling    Assessment/Plan: 1. Functional deficits secondary to Left pontine infarct  which require 3+ hours per day of interdisciplinary therapy in a comprehensive inpatient rehab setting.  Physiatrist is providing close team supervision and 24 hour management of active medical problems listed below.  Physiatrist and rehab team continue to assess barriers to discharge/monitor patient progress toward functional and medical goals  Care Tool:  Bathing  Bathing activity did not occur:  (N/A) Body parts bathed by patient: Right arm, Chest, Abdomen, Front perineal area, Face, Right upper leg, Left upper leg   Body parts bathed by helper: Left arm, Buttocks, Right lower leg, Left lower leg     Bathing assist Assist Level: Moderate Assistance - Patient 50 -  74%     Upper Body Dressing/Undressing Upper body dressing   What is the patient wearing?: Pull over shirt    Upper body assist Assist Level: Minimal Assistance - Patient > 75%    Lower Body Dressing/Undressing Lower body dressing      What is the patient wearing?: Incontinence brief, Pants     Lower body assist Assist for lower body dressing: Maximal Assistance - Patient 25 - 49%     Toileting Toileting    Toileting assist Assist for toileting: Dependent - Patient 0% (using Stedy +1 assist) Assistive Device Comment: Bariatric BSC   Transfers Chair/bed transfer  Transfers assist  Chair/bed transfer activity did not occur: N/A  Chair/bed transfer assist level: Dependent - mechanical lift     Locomotion Ambulation   Ambulation assist   Ambulation activity did not occur: N/A  Assist level: 2 helpers Assistive device: Walker-rolling Max distance: 25   Walk 10 feet activity   Assist  Walk 10 feet activity did not occur: N/A  Assist level: 2 helpers Assistive device: Walker-rolling   Walk 50 feet activity   Assist Walk 50 feet with 2 turns activity did not occur: N/A         Walk 150 feet activity   Assist Walk 150 feet activity did not occur: N/A         Walk 10 feet on uneven surface  activity   Assist Walk 10 feet on uneven surfaces activity did not occur: N/A         Wheelchair  Assist Will patient use wheelchair at discharge?: No (Per PT goals )      Wheelchair assist level: Moderate Assistance - Patient 50 - 74% Max wheelchair distance: 150    Wheelchair 50 feet with 2 turns activity    Assist        Assist Level: Moderate Assistance - Patient 50 - 74%   Wheelchair 150 feet activity     Assist      Assist Level: Moderate Assistance - Patient 50 - 74%   Blood pressure (!) 151/78, pulse 65, temperature 98.4 F (36.9 C), resp. rate 18, height 5\' 10"  (1.778 m), weight 129.3 kg, SpO2 91 %.  Medical  Problem List and Plan: 1.  Right hemiparesis secondary to left pontine infarction.  Follow-up outpatient cardiology services to discuss loop recorder             -patient may shower             -ELOS/Goals: 10/12/supervision/min a             Continue CIR PT, OT,SLP  2.  Antithrombotics: -DVT/anticoagulation: SCDs             -antiplatelet therapy: Aspirin 325 mg daily 3. Pain Management: Tylenol as needed. Well controlled.  4. Mood: Provide emotional support             -antipsychotic agents: N/A 5. Neuropsych: This patient is capable of making decisions on his own behalf. 6. Skin/Wound Care: Routine skin checks 7. Fluids/Electrolytes/Nutrition: Routine in and outs.  CMP ordered for tomorrow. 8.  Hypertension.  Norvasc 10 mg daily, Cozaar 100 mg daily.        mild elevation of systolic will monitor prior to med changes  Vitals:   06/28/20 1956 06/29/20 0515  BP: (!) 174/64 (!) 151/78  Pulse: 72 65  Resp: 16 18  Temp: 97.9 F (36.6 C) 98.4 F (36.9 C)  SpO2: 93% 91%  9.  Diabetes mellitus with hyperglycemia.  Hemoglobin A1c 7.5.  SSI.          CBG (last 3)  Recent Labs    06/28/20 1639 06/28/20 2116 06/29/20 0608  GLUCAP 99 156* 135*  fair to borderline control 9/29- stomach upset with metformin, on glimipride, some nausea last noc will monitor but if nausea remains will stop glimipride  10.  BPH.  Proscar.               emptying well 11.  Hyperlipidemia.  Lipitor--discussed rationale for statin after stroke and in general. He wishes to hold regardless 12.  History of gout.  Zyloprim 300 mg daily.  Monitor for any gout flareups 13.  History of non-Hodgkin's lymphoma status post chemotherapy.  Follow-up Dr Randa Evens as outpatient. 14.  Morbid obesity.  BMI 40.89.  Dietary follow-up 15.  History of rectal outlet bleeding.  Felt to be related to possible hemorrhoids.  Hemoglobin hematocrit mains stable.  He was to follow-up outpatient gastroenterology services.  Patient had  deferred GI work-up while in the hospital. Hgb low nl 16. Constipation improved with laxatives: emptying regularly now     LOS: 15 days A FACE TO FACE EVALUATION WAS PERFORMED  Charlett Blake 06/29/2020, 8:11 AM

## 2020-06-29 NOTE — Plan of Care (Signed)
  Problem: Consults Goal: RH STROKE PATIENT EDUCATION Description: See Patient Education module for education specifics  Outcome: Progressing Goal: Nutrition Consult-if indicated Outcome: Progressing Goal: Diabetes Guidelines if Diabetic/Glucose > 140 Description: If diabetic or lab glucose is > 140 mg/dl - Initiate Diabetes/Hyperglycemia Guidelines & Document Interventions  Outcome: Progressing   Problem: RH BOWEL ELIMINATION Goal: RH STG MANAGE BOWEL WITH ASSISTANCE Description: STG Manage Bowel with min Assistance. Outcome: Progressing Goal: RH STG MANAGE BOWEL W/MEDICATION W/ASSISTANCE Description: STG Manage Bowel with Medication with min Assistance. Outcome: Progressing   Problem: RH BLADDER ELIMINATION Goal: RH STG MANAGE BLADDER WITH ASSISTANCE Description: STG Manage Bladder With min Assistance Outcome: Progressing   Problem: RH SKIN INTEGRITY Goal: RH STG MAINTAIN SKIN INTEGRITY WITH ASSISTANCE Description: STG Maintain Skin Integrity With min Assistance. Outcome: Progressing Goal: RH STG ABLE TO PERFORM INCISION/WOUND CARE W/ASSISTANCE Description: STG Able To Perform Incision/Wound Care With supervision Assistance. Outcome: Progressing

## 2020-06-30 ENCOUNTER — Inpatient Hospital Stay (HOSPITAL_COMMUNITY): Payer: Medicare Other

## 2020-06-30 ENCOUNTER — Ambulatory Visit: Payer: Medicare Other

## 2020-06-30 ENCOUNTER — Inpatient Hospital Stay (HOSPITAL_COMMUNITY): Payer: Medicare Other | Admitting: Physical Therapy

## 2020-06-30 ENCOUNTER — Telehealth: Payer: Self-pay | Admitting: Oncology

## 2020-06-30 LAB — GLUCOSE, CAPILLARY
Glucose-Capillary: 129 mg/dL — ABNORMAL HIGH (ref 70–99)
Glucose-Capillary: 121 mg/dL — ABNORMAL HIGH (ref 70–99)
Glucose-Capillary: 113 mg/dL — ABNORMAL HIGH (ref 70–99)
Glucose-Capillary: 129 mg/dL — ABNORMAL HIGH (ref 70–99)

## 2020-06-30 NOTE — Progress Notes (Signed)
Physical Therapy Session Note  Patient Details  Name: Peter Ryan MRN: [id number] Date of Birth: 07-01-1951  Today's Date: 06/30/2020 PT Individual Time: 3086-5784 and 6962-9528 PT Individual Time Calculation (min): 68 min and 55 min    Short Term Goals: Week 3:  PT Short Term Goal 1 (Week 3): Pt will ambulate 73f with min assist and LRAD PT Short Term Goal 2 (Week 3): [facility] PT Short Term Goal 3 (Week 3): Pt will ascend 4 steps with BUE support and mod assist PT Short Term Goal 4 (Week 3): Pt will maintian balacne up to 3 min with min assist and LRAD  Skilled Therapeutic Interventions/Progress Updates:  Session 1  Pt received supine in bed and agreeable to PT. Supine>sit transfer with mod assist for pt to pull on PT into sitting. [facility]; pt noted to only require min cues for AD management and sequencing on this day for transfer.   [facility] balance training instructed by PT to maintain static standing without UE support 2 x 30 sec, then perform dual task  To hold ball at midline 2 x 30 sec, and then perform lateral trunk rotation to tap ball on RW handle 2 x 5 Bil.  Standing balance training also performed while engaged in fine motor task of And moderate difficulty Peg board puzzles while maintaining RUE support to complete puzzle and LUE support on RW to disassemble with the RUE. Min assist overall Min cues for midline midline progressing to moderate cues for improved divided attention and awareness of R lateral lean.   Gait training instructed by Pt x 657fwith min fading to mod assist from PT. Pt initially able to perform adequate step through on the R LE, but noted to have decreased weight shift to the L when fatigue, resulting in poor step length foot clearance on the R side.   WC mobility to force use of RLE x 5026fnd min assist from PT as well as mod-max cues for  improved shoulder ROM for symmetry R and L.   Patient returned to room and left sitting in [facility] in reach and all needs met.     Session 2  Pt received supine in bed and agreeable to PT. Supine>sit transfer with min assist from semireclined position and heavy use of bed rails.   Stand pivot [facility] and [facility]. PT provided pt with 20x18 WC to assess WC fit for d/c recommendation and improved efficiency with WC propulsion.   Dynamic balance training instructed by PT while engaged in dual task of wii bowling. Pt able to tolerate standing x 6 frames prior to requesting  Seated rest break. Then performed additional 2 frames in standing. RUE maintain in splint throughout standing and min assist from PT for safety. Cues for improved WB through the LLE and improved terminal knee extension on the R.   Pt requesting to use restroom for BM. Stand pivot transfer to and form toilet with min assist and increased time with RW. PT performed clothing management and pericare. Pt returned to room and performed  transfer to bed with RW and min assist, as wells as min cues for sequencing as pt's shoes required improved step height to prevent LOB. Sit>supine completed with min assist at the RLE, and left supine in bed with call bell in reach and  all needs met.         Therapy Documentation Precautions:  Precautions Precautions: Fall, Other (comment) Precaution Comments: R hemiparesis Restrictions Weight Bearing Restrictions: No Other Position/Activity Restrictions: protect weak RUE   Pain: Pain Assessment Pain Scale: 0-10 Pain Score: 3  Pain Type: Chronic pain Pain Descriptors / Indicators: Aching Pain Frequency: Constant Pain Intervention(s): Medication (See eMAR)   Therapy/Group: Individual Therapy  Lorie Phenix 06/30/2020, 9:07 AM

## 2020-06-30 NOTE — Progress Notes (Signed)
Occupational Therapy Session Note  Patient Details  Name: Peter Ryan MRN: [id number] Date of Birth: 1951/03/09  Today's Date: 06/30/2020 OT Individual Time: 1001-1045 OT Individual Time Calculation (min): 44 min    Short Term Goals: Week 2:  OT Short Term Goal 1 (Week 2): Patient will thread LLE in LB clothing seated EOB. OT Short Term Goal 2 (Week 2): Patient will complete functional transfers with LRAD and Mod A +1. OT Short Term Goal 3 (Week 2): Patient will incorporate RUE into 1/3 grooming tasks with Min A.  Skilled Therapeutic Interventions/Progress Updates:  Patient met seated in wc in agreement with OT treatment session with focus on self-care re-education and RUE NMR as detailed below. Patient with increased difficulty sequencing technique for wc mobility so total A provided for wc transport. At sink surface, patient able to complete 2/3 grooming tasks with increased incorporation of RUE with hand over hand assist. Patient demonstrates increased extension of digits 1-5, composite digit flexion, and increased gross grasp. In therapy gym, OT provided education on theraputty HEP with yellow theraputty. Patient continues to demonstrate low frustration tolerance with difficult tasks despite increased function of RUE. Session concluded with patient lying supine in bed with call bell within reach and all needs met.   Therapy Documentation Precautions:  Precautions Precautions: Fall, Other (comment) Precaution Comments: R hemiparesis Restrictions Weight Bearing Restrictions: No Other Position/Activity Restrictions: protect weak RUE General:   Therapy/Group: Individual Therapy  Charm Stenner R Howerton-Davis 06/30/2020, 10:30 AM

## 2020-06-30 NOTE — Progress Notes (Signed)
Physical Therapy Session Note  Patient Details  Name: Peter Ryan MRN: [id number] Date of Birth: December 22, 1950  Today's Date: 06/30/2020 PT Individual Time: 1749-4496 PT Individual Time Calculation (min): 45 min   Short Term Goals: Week 3:  PT Short Term Goal 1 (Week 3): Pt will ambulate 93ft with min assist and LRAD PT Short Term Goal 2 (Week 3): Pt will transfer to and from [facility] with min assist PT Short Term Goal 3 (Week 3): Pt will ascend 4 steps with BUE support and mod assist PT Short Term Goal 4 (Week 3): Pt will maintian balacne up to 3 min with min assist and LRAD  Skilled Therapeutic Interventions/Progress Updates:     Pt received supine in bed and agreeable to therapy. No complaint of pain. Supine to sit with minA and HOB elevated with use of bedrails. SPT to WC with min-modA. WC transport to gym for time management. Pt performs multiple reps of sit to stand throughout session, requiring minA and eventually modA with fatigue. Pt demos R lateral lean with tendency to lose balance posteriorly. Initially pt stands with RW and minA. Pt demos forward flexed posture and over reliance on RW, so subsequent stands performed without AD. Pt has mirror positioned for visual feedback and practices static standing balance. PT positioned on R side and initially providing modA to prevent R and posterior fall, with pt able to progress to standing with CGA. Pt able to maintain standing with CGA up to 45 seconds prior to losing balance R or posteriorly. WC transport back to room. Stand step transfer back to bed with RW and minA. ModA for sit to supine. Pt left supine in bed with alarm intact and all needs within reach.  Therapy Documentation Precautions:  Precautions Precautions: Fall, Other (comment) Precaution Comments: R hemiparesis Restrictions Weight Bearing Restrictions: No Other Position/Activity Restrictions: protect weak RUE   Therapy/Group: Individual Therapy  Breck Coons, PT,  DPT 06/30/2020, 3:35 PM

## 2020-06-30 NOTE — Plan of Care (Signed)
  Problem: RH BOWEL ELIMINATION Goal: RH STG MANAGE BOWEL WITH ASSISTANCE Description: STG Manage Bowel with min Assistance. Outcome: Progressing Goal: RH STG MANAGE BOWEL W/MEDICATION W/ASSISTANCE Description: STG Manage Bowel with Medication with min Assistance. Outcome: Progressing   Problem: RH SAFETY Goal: RH STG ADHERE TO SAFETY PRECAUTIONS W/ASSISTANCE/DEVICE Description: STG Adhere to Safety Precautions With Supervision Assistance and appropriate assistive Device. Outcome: Progressing

## 2020-06-30 NOTE — Telephone Encounter (Signed)
I called wife back and was wanting to know what was going on with the patient.  He had fallen 1 night and then he got up and thought he was okay and the next day he could not move the right side of his body and had speech issues.  They called EMS and he did have a stroke and he is currently in rehab inpatient at [facility].  Wife will call back once she is gotten out of the hospital and finished up some of his therapy to make a new appointment.

## 2020-06-30 NOTE — Telephone Encounter (Signed)
Pt wife called to cancel 07/06/20 appts. Pt is currently admitted to [facility]. He had a stroke, she will call back to reschedule his appt.

## 2020-07-01 DIAGNOSIS — I639 Cerebral infarction, unspecified: Secondary | ICD-10-CM

## 2020-07-01 LAB — GLUCOSE, CAPILLARY
Glucose-Capillary: 161 mg/dL — ABNORMAL HIGH (ref 70–99)
Glucose-Capillary: 136 mg/dL — ABNORMAL HIGH (ref 70–99)
Glucose-Capillary: 142 mg/dL — ABNORMAL HIGH (ref 70–99)
Glucose-Capillary: 112 mg/dL — ABNORMAL HIGH (ref 70–99)

## 2020-07-01 MED ORDER — LISINOPRIL 2.5 MG PO TABS
2.5000 mg | ORAL_TABLET | Freq: Every day | ORAL | Status: DC
  Administered 2020-07-01 – 2020-07-02 (×2): 2.5 mg via ORAL
  Filled 2020-07-01 (×2): qty 1

## 2020-07-01 NOTE — Progress Notes (Signed)
[place] PHYSICAL MEDICINE & REHABILITATION PROGRESS NOTE   Subjective/Complaints:  No complaints this morning. Denies pain, constipation, insomnia.  Wife at bedside has no concerns  ROS: Patient denies CP, SOB, N/V/D   Objective:   No results found. No results for input(s): WBC, HGB, HCT, PLT in the last 72 hours. No results for input(s): NA, K, CL, CO2, GLUCOSE, BUN, CREATININE, CALCIUM in the last 72 hours.  Intake/Output Summary (Last 24 hours) at 07/01/2020 1504 Last data filed at 07/01/2020 1230 Gross per 24 hour  Intake 140 ml  Output 750 ml  Net -610 ml       Physical Exam: Vital Signs Blood pressure (!) 154/83, pulse 68, temperature 97.7 F (36.5 C), resp. rate 18, height 5\' 10"  (1.778 m), weight 129.3 kg, SpO2 96 %.   General: Alert and oriented x 3, No apparent distress HEENT: Head is normocephalic, atraumatic, PERRLA, EOMI, sclera anicteric, oral mucosa pink and moist, dentition intact, ext ear canals clear,  Neck: Supple without JVD or lymphadenopathy Heart: Reg rate and rhythm. No murmurs rubs or gallops Chest: CTA bilaterally without wheezes, rales, or rhonchi; no distress Abdomen: Soft, non-tender, non-distended, bowel sounds positive. Extremities: No clubbing, cyanosis, or edema. Pulses are 2+ Skin: Clean and intact without signs of breakdown Motor 3- RUE and RLE,  5/5 on left  Musculoskeletal: Full range of motion in all 4 extremities. No joint swelling Psych: Pt's affect is appropriate. Pt is cooperative      Assessment/Plan: 1. Functional deficits secondary to Left pontine infarct  which require 3+ hours per day of interdisciplinary therapy in a comprehensive inpatient rehab setting.  Physiatrist is providing close team supervision and 24 hour management of active medical problems listed below.  Physiatrist and rehab team continue to assess barriers to discharge/monitor patient progress toward functional and medical goals  Care  Tool:  Bathing  Bathing activity did not occur:  (N/A) Body parts bathed by patient: Right arm, Chest, Abdomen, Front perineal area, Face, Right upper leg, Left upper leg, Left arm   Body parts bathed by helper: Buttocks, Right lower leg, Left lower leg     Bathing assist Assist Level: Minimal Assistance - Patient > 75%     Upper Body Dressing/Undressing Upper body dressing   What is the patient wearing?: Pull over shirt    Upper body assist Assist Level: Minimal Assistance - Patient > 75%    Lower Body Dressing/Undressing Lower body dressing      What is the patient wearing?: Incontinence brief, Pants     Lower body assist Assist for lower body dressing: Maximal Assistance - Patient 25 - 49%     Toileting Toileting    Toileting assist Assist for toileting: Dependent - Patient 0% (using Stedy +1 assist) Assistive Device Comment: Bariatric BSC   Transfers Chair/bed transfer  Transfers assist  Chair/bed transfer activity did not occur: N/A  Chair/bed transfer assist level: Dependent - mechanical lift     Locomotion Ambulation   Ambulation assist   Ambulation activity did not occur: N/A  Assist level: 2 helpers Assistive device: Walker-rolling Max distance: 25   Walk 10 feet activity   Assist  Walk 10 feet activity did not occur: N/A  Assist level: 2 helpers Assistive device: Walker-rolling   Walk 50 feet activity   Assist Walk 50 feet with 2 turns activity did not occur: N/A         Walk 150 feet activity   Assist Walk 150 feet activity did  not occur: N/A         Walk 10 feet on uneven surface  activity   Assist Walk 10 feet on uneven surfaces activity did not occur: N/A         Wheelchair     Assist Will patient use wheelchair at discharge?: No (Per PT goals )      Wheelchair assist level: Moderate Assistance - Patient 50 - 74% Max wheelchair distance: 150    Wheelchair 50 feet with 2 turns  activity    Assist        Assist Level: Moderate Assistance - Patient 50 - 74%   Wheelchair 150 feet activity     Assist      Assist Level: Moderate Assistance - Patient 50 - 74%   Blood pressure (!) 154/83, pulse 68, temperature 97.7 F (36.5 C), resp. rate 18, height 5\' 10"  (1.778 m), weight 129.3 kg, SpO2 96 %.  Medical Problem List and Plan: 1.  Right hemiparesis secondary to left pontine infarction.  Follow-up outpatient cardiology services to discuss loop recorder             -patient may shower             -ELOS/Goals: 10/12/supervision/min a             Continue CIR PT, OT,SLP  2.  Antithrombotics: -DVT/anticoagulation: SCDs             -antiplatelet therapy: Aspirin 325 mg daily 3. Pain Management: Tylenol as needed. Well controlled 4. Mood: Provide emotional support             -antipsychotic agents: N/A 5. Neuropsych: This patient is capable of making decisions on his own behalf. 6. Skin/Wound Care: Routine skin checks 7. Fluids/Electrolytes/Nutrition: Routine in and outs.  CMP ordered for tomorrow. 8.  Hypertension.  Norvasc 10 mg daily, Cozaar 100 mg daily.        10/2: elevated. Cr normal. Will start Lisinopril 2.5mg  Vitals:   06/30/20 1941 07/01/20 0450  BP: (!) 168/78 (!) 154/83  Pulse: 72 68  Resp: 16 18  Temp: (!) 97.5 F (36.4 C) 97.7 F (36.5 C)  SpO2: 95% 96%  9.  Diabetes mellitus with hyperglycemia.  Hemoglobin A1c 7.5.  SSI.          CBG (last 3)  Recent Labs    06/30/20 2115 07/01/20 0619 07/01/20 1113  GLUCAP 129* 161* 136*  10/2: Mildly elevated- continue to monitor.   10.  BPH.  Proscar.               emptying well 11.  Hyperlipidemia.  Lipitor--discussed rationale for statin after stroke and in general. He wishes to hold regardless 12.  History of gout.  Zyloprim 300 mg daily.  Monitor for any gout flareups 13.  History of non-Hodgkin's lymphoma status post chemotherapy.  Follow-up Dr Randa Evens as outpatient. 14.  Morbid  obesity.  BMI 40.89.  Dietary follow-up 15.  History of rectal outlet bleeding.  Felt to be related to possible hemorrhoids.  Hemoglobin hematocrit mains stable.  He was to follow-up outpatient gastroenterology services.  Patient had deferred GI work-up while in the hospital. Hgb low nl 16. Constipation improved with laxatives: emptying regularly now     LOS: 17 days A FACE TO FACE EVALUATION WAS PERFORMED  Peter Ryan Peter Ryan 07/01/2020, 3:04 PM

## 2020-07-01 NOTE — Plan of Care (Signed)
  Problem: RH BOWEL ELIMINATION Goal: RH STG MANAGE BOWEL WITH ASSISTANCE Description: STG Manage Bowel with min Assistance. Outcome: Progressing Goal: RH STG MANAGE BOWEL W/MEDICATION W/ASSISTANCE Description: STG Manage Bowel with Medication with min Assistance. Outcome: Progressing   Problem: RH SKIN INTEGRITY Goal: RH STG MAINTAIN SKIN INTEGRITY WITH ASSISTANCE Description: STG Maintain Skin Integrity With min Assistance. Outcome: Progressing Goal: RH STG ABLE TO PERFORM INCISION/WOUND CARE W/ASSISTANCE Description: STG Able To Perform Incision/Wound Care With supervision Assistance. Outcome: Progressing

## 2020-07-02 ENCOUNTER — Inpatient Hospital Stay (HOSPITAL_COMMUNITY): Payer: Medicare Other | Admitting: Physical Therapy

## 2020-07-02 LAB — GLUCOSE, CAPILLARY
Glucose-Capillary: 112 mg/dL — ABNORMAL HIGH (ref 70–99)
Glucose-Capillary: 122 mg/dL — ABNORMAL HIGH (ref 70–99)
Glucose-Capillary: 111 mg/dL — ABNORMAL HIGH (ref 70–99)
Glucose-Capillary: 153 mg/dL — ABNORMAL HIGH (ref 70–99)

## 2020-07-02 MED ORDER — LISINOPRIL 5 MG PO TABS
5.0000 mg | ORAL_TABLET | Freq: Every day | ORAL | Status: DC
  Administered 2020-07-03 – 2020-07-07 (×5): 5 mg via ORAL
  Filled 2020-07-02 (×5): qty 1

## 2020-07-02 NOTE — Plan of Care (Signed)
  Problem: RH BOWEL ELIMINATION Goal: RH STG MANAGE BOWEL WITH ASSISTANCE Description: STG Manage Bowel with min Assistance. Outcome: Progressing Goal: RH STG MANAGE BOWEL W/MEDICATION W/ASSISTANCE Description: STG Manage Bowel with Medication with min Assistance. Outcome: Progressing   Problem: RH BLADDER ELIMINATION Goal: RH STG MANAGE BLADDER WITH ASSISTANCE Description: STG Manage Bladder With min Assistance Outcome: Progressing   

## 2020-07-02 NOTE — Progress Notes (Signed)
Physical Therapy Session Note  Patient Details  Name: Peter Ryan MRN: [id number] Date of Birth: Mar 26, 1951  Today's Date: 07/02/2020 PT Individual Time: 0211-1735 PT Individual Time Calculation (min): 55 min   Short Term Goals: Week 1:  PT Short Term Goal 1 (Week 1): Pt will perform supine<>sit with max assist of 1 PT Short Term Goal 1 - Progress (Week 1): Met PT Short Term Goal 2 (Week 1): Pt will perform sit<>stands with +2 max assist using LRAD (not lift equipment) PT Short Term Goal 2 - Progress (Week 1): Met PT Short Term Goal 3 (Week 1): Pt will perform bed<>chair transfers with +2 max assist without use of lift equipment PT Short Term Goal 3 - Progress (Week 1): Met PT Short Term Goal 4 (Week 1): Pt will initiate gait training PT Short Term Goal 4 - Progress (Week 1): Met  Skilled Therapeutic Interventions/Progress Updates:  Pt was seen bedside in the pm sitting up in w/c with wife present at bedside. Pt transported to rehab gym. Pt performed multiple sit to stand transfers with rolling walker and min A with increased assistance noted as patient fatigued to mod A. Performed step taps 3 sets x 5 reps each NMR. Pt ambulated 4 x 15 feet with rolling walker and mod A with second person follow with w/c. Pt required multiple verbal cues. Pt's wife present throughout treatment. Pt returned to room and left sitting up in w/c with chair alarm on and all needs within reach.   Therapy Documentation Precautions:  Precautions Precautions: Fall, Other (comment) Precaution Comments: R hemiparesis Restrictions Weight Bearing Restrictions: No Other Position/Activity Restrictions: protect weak RUE General:   Pain: No c/o pain.   Therapy/Group: Individual Therapy  Dub Amis 07/02/2020, 3:26 PM

## 2020-07-02 NOTE — Progress Notes (Signed)
[facility] PHYSICAL MEDICINE & REHABILITATION PROGRESS NOTE   Subjective/Complaints: Patient felt therapy went well for him today. He asks about additional anti-hypertensive I started him on. Went over his pressures with him which have been uncontrolled. Discussed better control will help to minimize risk of future stroke  ROS: Patient denies CP, SOB, N/V/D   Objective:   No results found. No results for input(s): WBC, HGB, HCT, PLT in the last 72 hours. No results for input(s): NA, K, CL, CO2, GLUCOSE, BUN, CREATININE, CALCIUM in the last 72 hours.  Intake/Output Summary (Last 24 hours) at 07/02/2020 1435 Last data filed at 07/02/2020 0900 Gross per 24 hour  Intake 120 ml  Output 250 ml  Net -130 ml       Physical Exam: Vital Signs Blood pressure (!) 161/83, pulse 82, temperature 98.2 F (36.8 C), resp. rate 14, height 5\' 10"  (1.778 m), weight 129.3 kg, SpO2 98 %. General: Alert and oriented x 3, No apparent distress HEENT: Head is normocephalic, atraumatic, PERRLA, EOMI, sclera anicteric, oral mucosa pink and moist, dentition intact, ext ear canals clear,  Neck: Supple without JVD or lymphadenopathy Heart: Reg rate and rhythm. No murmurs rubs or gallops Chest: CTA bilaterally without wheezes, rales, or rhonchi; no distress Abdomen: Soft, non-tender, non-distended, bowel sounds positive. Extremities: No clubbing, cyanosis, or edema. Pulses are 2+ Skin: Clean and intact without signs of breakdown Motor 3- RUE and RLE,  5/5 on left  Musculoskeletal: Full range of motion in all 4 extremities. No joint swelling Psych: Pt's affect is appropriate. Pt is cooperative      Assessment/Plan: 1. Functional deficits secondary to Left pontine infarct  which require 3+ hours per day of interdisciplinary therapy in a comprehensive inpatient rehab setting.  Physiatrist is providing close team supervision and 24 hour management of active medical problems listed below.  Physiatrist and  rehab team continue to assess barriers to discharge/monitor patient progress toward functional and medical goals  Care Tool:  Bathing  Bathing activity did not occur:  (N/A) Body parts bathed by patient: Right arm, Chest, Abdomen, Front perineal area, Face, Right upper leg, Left upper leg, Left arm   Body parts bathed by helper: Buttocks, Right lower leg, Left lower leg     Bathing assist Assist Level: Minimal Assistance - Patient > 75%     Upper Body Dressing/Undressing Upper body dressing   What is the patient wearing?: Pull over shirt    Upper body assist Assist Level: Minimal Assistance - Patient > 75%    Lower Body Dressing/Undressing Lower body dressing      What is the patient wearing?: Incontinence brief, Pants     Lower body assist Assist for lower body dressing: Maximal Assistance - Patient 25 - 49%     Toileting Toileting    Toileting assist Assist for toileting: Dependent - Patient 0% (using Stedy +1 assist) Assistive Device Comment: Bariatric BSC   Transfers Chair/bed transfer  Transfers assist  Chair/bed transfer activity did not occur: N/A  Chair/bed transfer assist level: Dependent - mechanical lift     Locomotion Ambulation   Ambulation assist   Ambulation activity did not occur: N/A  Assist level: 2 helpers Assistive device: Walker-rolling Max distance: 15   Walk 10 feet activity   Assist  Walk 10 feet activity did not occur: N/A  Assist level: 2 helpers Assistive device: Walker-rolling   Walk 50 feet activity   Assist Walk 50 feet with 2 turns activity did not occur: N/A  Walk 150 feet activity   Assist Walk 150 feet activity did not occur: N/A         Walk 10 feet on uneven surface  activity   Assist Walk 10 feet on uneven surfaces activity did not occur: N/A         Wheelchair     Assist Will patient use wheelchair at discharge?: No (Per PT goals )      Wheelchair assist level: Moderate  Assistance - Patient 50 - 74% Max wheelchair distance: 150    Wheelchair 50 feet with 2 turns activity    Assist        Assist Level: Moderate Assistance - Patient 50 - 74%   Wheelchair 150 feet activity     Assist      Assist Level: Moderate Assistance - Patient 50 - 74%   Blood pressure (!) 161/83, pulse 82, temperature 98.2 F (36.8 C), resp. rate 14, height 5\' 10"  (1.778 m), weight 129.3 kg, SpO2 98 %.  Medical Problem List and Plan: 1.  Right hemiparesis secondary to left pontine infarction.  Follow-up outpatient cardiology services to discuss loop recorder             -patient may shower             -ELOS/Goals: 10/12/supervision/min a             Continue CIR PT, OT,SLP  2.  Antithrombotics: -DVT/anticoagulation: SCDs             -antiplatelet therapy: Aspirin 325 mg daily 3. Pain Management: Tylenol as needed. Well controlled 4. Mood: Provide emotional support             -antipsychotic agents: N/A 5. Neuropsych: This patient is capable of making decisions on his own behalf. 6. Skin/Wound Care: Routine skin checks 7. Fluids/Electrolytes/Nutrition: Routine in and outs.  CMP ordered for tomorrow. 8.  Hypertension.  Norvasc 10 mg daily, Cozaar 100 mg daily.        10/3: elevated. Cr normal. Will increase Lisinopril to 5mg . Discussed that this is similar class of medication to Cozaar, which he is tolerating well. Discussed potential side effects.  Vitals:   07/02/20 0506 07/02/20 1305  BP: (!) 168/76 (!) 161/83  Pulse: 62 82  Resp: 18 14  Temp: 97.7 F (36.5 C) 98.2 F (36.8 C)  SpO2: 96% 98%  9.  Diabetes mellitus with hyperglycemia.  Hemoglobin A1c 7.5.  SSI.          CBG (last 3)  Recent Labs    07/01/20 2131 07/02/20 0619 07/02/20 1109  GLUCAP 112* 112* 122*  10/3 mildly elevated: discussed intermittent fasting, low sugar/low carb diet as options for better control once at home.    10.  BPH.  Proscar.               emptying well 11.   Hyperlipidemia.  Lipitor--discussed rationale for statin after stroke and in general. He wishes to hold regardless 12.  History of gout.  Zyloprim 300 mg daily.  Monitor for any gout flareups 13.  History of non-Hodgkin's lymphoma status post chemotherapy.  Follow-up Dr Randa Evens as outpatient. 14.  Morbid obesity.  BMI 40.89.  Dietary follow-up 15.  History of rectal outlet bleeding.  Felt to be related to possible hemorrhoids.  Hemoglobin hematocrit mains stable.  He was to follow-up outpatient gastroenterology services.  Patient had deferred GI work-up while in the hospital. Hgb low nl 16. Constipation improved with laxatives: emptying  regularly.      LOS: 18 days A FACE TO FACE EVALUATION WAS PERFORMED  Clide Deutscher Gerhardt Gleed 07/02/2020, 2:35 PM

## 2020-07-03 ENCOUNTER — Inpatient Hospital Stay (HOSPITAL_COMMUNITY): Payer: Medicare Other | Admitting: Physical Therapy

## 2020-07-03 ENCOUNTER — Inpatient Hospital Stay (HOSPITAL_COMMUNITY): Payer: Medicare Other | Admitting: Occupational Therapy

## 2020-07-03 DIAGNOSIS — I10 Essential (primary) hypertension: Secondary | ICD-10-CM

## 2020-07-03 LAB — GLUCOSE, CAPILLARY
Glucose-Capillary: 140 mg/dL — ABNORMAL HIGH (ref 70–99)
Glucose-Capillary: 120 mg/dL — ABNORMAL HIGH (ref 70–99)
Glucose-Capillary: 140 mg/dL — ABNORMAL HIGH (ref 70–99)
Glucose-Capillary: 100 mg/dL — ABNORMAL HIGH (ref 70–99)

## 2020-07-03 NOTE — Progress Notes (Signed)
[facility] PHYSICAL MEDICINE & REHABILITATION PROGRESS NOTE   Subjective/Complaints: Up early with therapy. Concerned that he's on more medications for BP but knows he needs them to control pressure  ROS: Patient denies fever, rash, sore throat, blurred vision, nausea, vomiting, diarrhea, cough, shortness of breath or chest pain, joint or back pain, headache, or mood change.    Objective:   No results found. No results for input(s): WBC, HGB, HCT, PLT in the last 72 hours. No results for input(s): NA, K, CL, CO2, GLUCOSE, BUN, CREATININE, CALCIUM in the last 72 hours.  Intake/Output Summary (Last 24 hours) at 07/03/2020 1112 Last data filed at 07/03/2020 0716 Gross per 24 hour  Intake 657 ml  Output 600 ml  Net 57 ml       Physical Exam: Vital Signs Blood pressure (!) 142/69, pulse 66, temperature (!) 97.5 F (36.4 C), resp. rate 18, height 5\' 10"  (1.778 m), weight 129.3 kg, SpO2 93 %. Constitutional: No distress . Vital signs reviewed. HEENT: EOMI, oral membranes moist Neck: supple Cardiovascular: RRR without murmur. No JVD    Respiratory/Chest: CTA Bilaterally without wheezes or rales. Normal effort    GI/Abdomen: BS +, non-tender, non-distended Ext: no clubbing, cyanosis, or edema Psych: pleasant Skin: Clean and intact without signs of breakdown Motor 3 to 3+ RUE and RLE,  5/5 on left  Musculoskeletal: Full range of motion in all 4 extremities. No joint swelling        Assessment/Plan: 1. Functional deficits secondary to Left pontine infarct  which require 3+ hours per day of interdisciplinary therapy in a comprehensive inpatient rehab setting.  Physiatrist is providing close team supervision and 24 hour management of active medical problems listed below.  Physiatrist and rehab team continue to assess barriers to discharge/monitor patient progress toward functional and medical goals  Care Tool:  Bathing  Bathing activity did not occur:  (N/A) Body parts  bathed by patient: Right arm, Chest, Abdomen, Front perineal area, Face, Right upper leg, Left upper leg, Left arm   Body parts bathed by helper: Buttocks, Right lower leg, Left lower leg     Bathing assist Assist Level: Minimal Assistance - Patient > 75%     Upper Body Dressing/Undressing Upper body dressing   What is the patient wearing?: Pull over shirt    Upper body assist Assist Level: Minimal Assistance - Patient > 75%    Lower Body Dressing/Undressing Lower body dressing      What is the patient wearing?: Incontinence brief, Pants     Lower body assist Assist for lower body dressing: Maximal Assistance - Patient 25 - 49%     Toileting Toileting    Toileting assist Assist for toileting: Dependent - Patient 0% (using Stedy +1 assist) Assistive Device Comment: Bariatric BSC   Transfers Chair/bed transfer  Transfers assist  Chair/bed transfer activity did not occur: N/A  Chair/bed transfer assist level: Dependent - mechanical lift     Locomotion Ambulation   Ambulation assist   Ambulation activity did not occur: N/A  Assist level: 2 helpers Assistive device: Walker-rolling Max distance: 15   Walk 10 feet activity   Assist  Walk 10 feet activity did not occur: N/A  Assist level: 2 helpers Assistive device: Walker-rolling   Walk 50 feet activity   Assist Walk 50 feet with 2 turns activity did not occur: N/A         Walk 150 feet activity   Assist Walk 150 feet activity did not occur: N/A  Walk 10 feet on uneven surface  activity   Assist Walk 10 feet on uneven surfaces activity did not occur: N/A         Wheelchair     Assist Will patient use wheelchair at discharge?: No (Per PT goals )      Wheelchair assist level: Moderate Assistance - Patient 50 - 74% Max wheelchair distance: 150    Wheelchair 50 feet with 2 turns activity    Assist        Assist Level: Moderate Assistance - Patient 50 - 74%    Wheelchair 150 feet activity     Assist      Assist Level: Moderate Assistance - Patient 50 - 74%   Blood pressure (!) 142/69, pulse 66, temperature (!) 97.5 F (36.4 C), resp. rate 18, height 5\' 10"  (1.778 m), weight 129.3 kg, SpO2 93 %.  Medical Problem List and Plan: 1.  Right hemiparesis secondary to left pontine infarction.  Follow-up outpatient cardiology services to discuss loop recorder             -patient may shower             -ELOS/Goals: 10/12/supervision/min a             Continue CIR PT, OT,SLP  2.  Antithrombotics: -DVT/anticoagulation: SCDs             -antiplatelet therapy: Aspirin 325 mg daily 3. Pain Management: Tylenol as needed. Well controlled 4. Mood: Provide emotional support             -antipsychotic agents: N/A 5. Neuropsych: This patient is capable of making decisions on his own behalf. 6. Skin/Wound Care: Routine skin checks 7. Fluids/Electrolytes/Nutrition: Routine in and outs.  CMP ordered for tomorrow. 8.  Hypertension.  Norvasc 10 mg daily, Cozaar 100 mg daily.        10/3: elevated. Cr normal. Will increase Lisinopril to 5mg . Discussed that this is similar class of medication to Cozaar, which he is tolerating well.     10/4 bp's better today, continue current plan. Discussed bp control again with patient toay. Vitals:   07/02/20 1900 07/03/20 0455  BP: 136/69 (!) 142/69  Pulse: 78 66  Resp: 18   Temp: 98.2 F (36.8 C) (!) 97.5 F (36.4 C)  SpO2: 97% 93%  9.  Diabetes mellitus with hyperglycemia.  Hemoglobin A1c 7.5.  SSI.          CBG (last 3)  Recent Labs    07/02/20 1628 07/02/20 2156 07/03/20 0559  GLUCAP 111* 153* 140*  10/3-4 mildly elevated: reviewed dietary habits, exercise, weight loss as means of improving control at home   10.  BPH.  Proscar.               emptying well 11.  Hyperlipidemia.  Lipitor--discussed rationale for statin after stroke and in general. He wishes to hold regardless 12.  History of gout.   Zyloprim 300 mg daily.  Monitor for any gout flareups 13.  History of non-Hodgkin's lymphoma status post chemotherapy.  Follow-up Dr Randa Evens as outpatient. 14.  Morbid obesity.  BMI 40.89.  Dietary follow-up 15.  History of rectal outlet bleeding.  Felt to be related to possible hemorrhoids.  Hemoglobin hematocrit mains stable.  He was to follow-up outpatient gastroenterology services.  Patient had deferred GI work-up while in the hospital. Hgb low nl 16. Constipation improved with laxatives: emptying regularly.      LOS: 19 days A FACE TO  [place] EVALUATION WAS PERFORMED  Meredith Staggers 07/03/2020, 11:12 AM

## 2020-07-03 NOTE — Progress Notes (Signed)
Physical Therapy Session Note  Patient Details  Name: Peter Ryan MRN: [id number] Date of Birth: 03/06/1951  Today's Date: 07/03/2020 PT Individual Time: 0803-0900 and 1347-1500 PT Individual Time Calculation (min): 57 min and 73 min  Short Term Goals: Week 3:  PT Short Term Goal 1 (Week 3): Pt will ambulate 63ft with min assist and LRAD PT Short Term Goal 2 (Week 3): Pt will transfer to and from [facility] with min assist PT Short Term Goal 3 (Week 3): Pt will ascend 4 steps with BUE support and mod assist PT Short Term Goal 4 (Week 3): Pt will maintian balacne up to 3 min with min assist and LRAD  Skilled Therapeutic Interventions/Progress Updates: Pt presented in bed agreeable to therapy. Pt denies pain during session. Performed supine to sit with modA and verbal cues with use of bed features. Pt noted to have increased R lateral lean this am and required mod multimodal cues to correct with limited carryover. PTA donned shoes for time management. Performed stand pivot transfer with modA to w/c and transported to rehab gym for energy conservation. Performed stand pivot to mat modA with improved technique from previous transfer. Participated in STS without AD with modA for static balance with pt requiring modA once in standing with mirror feedback to maintain midline. Performed toe taps to 6in step for wt bearing on RLE and forced use of RLE. Pt required verbal cues to increase BOS when returning R foot to level tile. Pt also required cues for midline when placing LLE on step due to increased lean. After an extended rest pt performed stand pivot transfer back to w/c with PTA providing increased cues for improving R foot clearance with pt demonstrating improved foot clearance with cues. Pt then participated in w/c mobility x 165ft with minA to maintain straight trajectory and vc's to increase R shoulder extension for improved propulsion. Pt also required cues to use LLE as continues to only attempt with  BUE. Pt transported remaining distance back to room and agreeable to remain in w/c until next session in 74min. Pt left with belt alarm on, call bell within reach and needs met.   Tx2: Pt presented in w/c with wife present agreeable to therapy. Pt denies pain but states some fatigue. Pt moved to hallway and participated in w/c propulsion x 160ft. Pt required mod cues for sequencing and assist for coordination of LLE to assist with propulsion. Pt did well with cues to increase RUE use and to increase shoulder extension. Pt also then transported remaining distance to day room and participated in gait training. Pt ambulated 106ft x 4 with RW and w/c follow. Pt required verbal cues for clearing R foot and to increase R step length as well as increasing erect posture ans staying with RW. If pt stayed with close proximity of RW pt was minA, if RW was too far in front of pt more modA due to increased R lateral lean. Pt also noted to drift to R when ambulating therefore PTA placed markers(numbers) on floor to allow pt to maintain more straight path with success. Pt noted to have improved R foot clearance this session than previous bouts of ambulation with this therapist. Pt's wife then voiced concern regarding car transfer. Pt then transported to ortho gym and PTA provided education on technique for car transfer. Wife advised will obtain seat height so that car transfer can be practiced at comparable height. Pt then transported back to room and pt performed stand pivot to bed  with minA overall. Pt required modA to return to bed and was able to reposition to comfort. Pt left in bed with bed alarm on, call bell within reach and needs met.      Therapy Documentation Precautions:  Precautions Precautions: Fall, Other (comment) Precaution Comments: R hemiparesis Restrictions Weight Bearing Restrictions: No Other Position/Activity Restrictions: protect weak RUE    Therapy/Group: Individual Therapy  Kelis Plasse  Ereka Brau, PTA  07/03/2020, 12:33 PM

## 2020-07-03 NOTE — Progress Notes (Signed)
Occupational Therapy Weekly Progress Note  Patient Details  Name: Peter Ryan MRN: [id number] Date of Birth: 07/16/1951  Beginning of progress report period: 06/23/2020 End of progress report period: 07/03/2020  Today's Date: 07/03/2020 OT Individual Time: 2035-5974 OT Individual Time Calculation (min): 60 min    Patient has met 3 of 3 short term goals.    Patient has made excellent progress at time of report. He can now complete walk in shower transfers via stand pivot method vs using the [facility] lift. Pt can also now bathe at a sit<stand level vs utilizing lean techniques. Functional use of the Rt UE is improving as well. Caregiver education and d/c collaboration has been initiated with spouse, Peter Ryan. Continue OT POC.   Patient continues to demonstrate the following deficits: muscle weakness, decreased cardiorespiratoy endurance, unbalanced muscle activation and decreased coordination, decreased problem solving and decreased sitting balance, decreased standing balance, decreased postural control and hemiplegia and therefore will continue to benefit from skilled OT intervention to enhance overall performance with BADL.  Patient progressing toward long term goals..  Continue plan of care.  OT Short Term Goals Week 2:  OT Short Term Goal 1 (Week 2): Patient will thread LLE in LB clothing seated EOB. OT Short Term Goal 1 - Progress (Week 2): Met OT Short Term Goal 2 (Week 2): Patient will complete functional transfers with LRAD and Mod A +1. OT Short Term Goal 2 - Progress (Week 2): Met OT Short Term Goal 3 (Week 2): Patient will incorporate RUE into 1/3 grooming tasks with Min A. OT Short Term Goal 3 - Progress (Week 2): Met Week 3:  OT Short Term Goal 1 (Week 3): STGs=LTGs due to ELOS  Skilled Therapeutic Interventions/Progress Updates:    Pt greeted in the w/c with no c/o pain. Spouse Peter Ryan present to observe session. Started with stand pivot transfer to the TTB using grab bar, pt  executing transfer with Mod A. He then bathed at sit<stand level using the grab bar for standing support and a towel placed below feet to increase traction for standing. Pt required assistance for washing both feet and buttocks, would benefit from a LH sponge when these are in supply. CGA for sit<stand with pt able to stand for ~10 second intervals with Min-Mod balance assistance while OT completed perihygiene. Discussed shower DME with Peter Ryan, who stated their bathroom is not w/c accessible. Educated Peter Ryan that if pt is not yet at an ambulatory level just prior to d/c that they will need to figure out a sponge bathing option until [place] deems TTB transfers with RW safe. She verbalized understanding. Pt used the Rt hand to adjust shower temperature with min facilitation at the elbow, able to reach his Lt underarm using the Rt to wash! Min cuing for neutral midline due to Rt lean near end of bathing due to fatigue. Mod A for transfer out of shower, going towards the Rt side. He dressed w/c level at sink sit<stand. Mod A for sit<stands with facilitation for anterior weight shifting and also assistance for preventing Rt foot from slipping forward (while wearing gripper sock). Total A for elevating brief and pants. To thread LEs into pants, pt used the reacher with both hands and Min A. Min A for donning overhead shirt. Mod facilitation when reaching for grooming items on the sink using the Rt hand with vcs. At end of session pt remained sitting in the w/c with all needs within reach and safety belt fastened.     Therapy  Documentation Precautions:  Precautions Precautions: Fall, Other (comment) Precaution Comments: R hemiparesis Restrictions Weight Bearing Restrictions: No Other Position/Activity Restrictions: protect weak RUE Vital Signs: ADL: ADL Eating: Set up Where Assessed-Eating: Bed level Grooming: Setup Where Assessed-Grooming: Bed level Upper Body Bathing: Moderate cueing Where Assessed-Upper  Body Bathing: Bed level Lower Body Bathing: Maximal assistance Where Assessed-Lower Body Bathing: Bed level Upper Body Dressing: Maximal assistance Where Assessed-Upper Body Dressing: Bed level Lower Body Dressing: Maximal assistance Where Assessed-Lower Body Dressing: Bed level Toileting: Not assessed Toilet Transfer: Not assessed      Therapy/Group: Individual Therapy  Michaela A Hoffman 07/03/2020, 7:14 AM  

## 2020-07-04 ENCOUNTER — Inpatient Hospital Stay (HOSPITAL_COMMUNITY): Payer: Medicare Other | Admitting: Physical Therapy

## 2020-07-04 ENCOUNTER — Inpatient Hospital Stay (HOSPITAL_COMMUNITY): Payer: Medicare Other | Admitting: Occupational Therapy

## 2020-07-04 ENCOUNTER — Inpatient Hospital Stay (HOSPITAL_COMMUNITY): Payer: Medicare Other

## 2020-07-04 LAB — GLUCOSE, CAPILLARY
Glucose-Capillary: 130 mg/dL — ABNORMAL HIGH (ref 70–99)
Glucose-Capillary: 105 mg/dL — ABNORMAL HIGH (ref 70–99)
Glucose-Capillary: 103 mg/dL — ABNORMAL HIGH (ref 70–99)
Glucose-Capillary: 123 mg/dL — ABNORMAL HIGH (ref 70–99)

## 2020-07-04 NOTE — Progress Notes (Signed)
Physical Therapy Session Note  Patient Details  Name: Peter Ryan MRN: [id number] Date of Birth: 1951-09-04  Today's Date: 07/04/2020 PT Individual Time: 7614-7092 PT Individual Time Calculation (min): 24 min   Short Term Goals: Week 3:  PT Short Term Goal 1 (Week 3): Pt will ambulate 90ft with min assist and LRAD PT Short Term Goal 2 (Week 3): Pt will transfer to and from [facility] with min assist PT Short Term Goal 3 (Week 3): Pt will ascend 4 steps with BUE support and mod assist PT Short Term Goal 4 (Week 3): Pt will maintian balacne up to 3 min with min assist and LRAD  Skilled Therapeutic Interventions/Progress Updates:    Pt received supine in bed, agreeable to PT session although reporting moderate fatigue from busy day of therapies. Performed supine<>sit with minA using bed features, requiring modA for forward scooting at EOB. Performed repeated sit<>stand transfers 2x5 (seated rest break) fluctuating b/w min/modA to RW, cues for forward weight shift, sequencing, and foot placement. Ambulated ~36ft with minA and RW within his room, demo's RLE circumduction with compensation of adductors, decreased R step length and decreased R heel strike. Cues throughout for normalizing gait pattern and assisting with RW management during tight turns. Required modA for sit>supine for BLE management. Ended session semi-reclined with needs in reach, bed alarm on, 3/4 rails up.   Therapy Documentation Precautions:  Precautions Precautions: Fall, Other (comment) Precaution Comments: R hemiparesis Restrictions Weight Bearing Restrictions: No Other Position/Activity Restrictions: protect weak RUE  Therapy/Group: Individual Therapy  [name] Delphina Schum PT 07/04/2020, 1:27 PM

## 2020-07-04 NOTE — Progress Notes (Signed)
Occupational Therapy Session Note  Patient Details  Name: Eland Lamantia MRN: [id number] Date of Birth: 1950/10/11  Today's Date: 07/04/2020 OT Individual Time: 1415-1500 OT Individual Time Calculation (min): 45 min    Skilled Therapeutic Interventions/Progress Updates:    1:1 NMR with focus on dynamic sitting balance, functional stand pivot transfers with  RW, sit to stands, functional reach with right UE and ROM with 1 lb weight of right UE. Pt came to EOB with max A from right sidelying. Initially pt required mod cues for maintaining balance at [facility] while trying to manipulate shoe laces when attempting to tie. Pt attempted squat pivot but unable to come into squatting position but able to perform sit to stand to stand pivot to the w/c. In the gym performed stand pivot with min A with mod VC for sequence. At [facility] focus on proximal stability of right UE within normal patterns of movement with 1 lb weight and without weight. Transitioned into reaching with right hand across field into left field to obtain clothespin and then reach into right field to place on rail. Focus on maintaining dynamic sitting balance with weight shifts in both directions.   Returned to the room and left sitting up in the w/c with safety belt.   Therapy Documentation Precautions:  Precautions Precautions: Fall, Other (comment) Precaution Comments: R hemiparesis Restrictions Weight Bearing Restrictions: No Other Position/Activity Restrictions: protect weak RUE Pain: Pain Assessment Pain Scale: 0-10 Pain Score: 0-No pain   Therapy/Group: Individual Therapy  Willeen Cass [facility] 07/04/2020, 3:18 PM

## 2020-07-04 NOTE — Progress Notes (Signed)
[facility] PHYSICAL MEDICINE & REHABILITATION PROGRESS NOTE   Subjective/Complaints: No complaints this morning. Denies pain, constipation, insomnia.   ROS: Patient denies fever, rash, sore throat, blurred vision, nausea, vomiting, diarrhea, cough, shortness of breath or chest pain, joint or back pain, headache, or mood change.    Objective:   No results found. No results for input(s): WBC, HGB, HCT, PLT in the last 72 hours. No results for input(s): NA, K, CL, CO2, GLUCOSE, BUN, CREATININE, CALCIUM in the last 72 hours.  Intake/Output Summary (Last 24 hours) at 07/04/2020 1034 Last data filed at 07/04/2020 0719 Gross per 24 hour  Intake 540 ml  Output 250 ml  Net 290 ml       Physical Exam: Vital Signs Blood pressure (!) 143/66, pulse 61, temperature 97.6 F (36.4 C), resp. rate 20, height 5\' 10"  (1.778 m), weight 129.3 kg, SpO2 95 %.  General: Alert and oriented x 3, No apparent distress HEENT: Head is normocephalic, atraumatic, PERRLA, EOMI, sclera anicteric, oral mucosa pink and moist, dentition intact, ext ear canals clear,  Neck: Supple without JVD or lymphadenopathy Heart: Reg rate and rhythm. No murmurs rubs or gallops Chest: CTA bilaterally without wheezes, rales, or rhonchi; no distress GI/Abdomen: BS +, non-tender, non-distended Ext: no clubbing, cyanosis, or edema Psych: pleasant Skin: Clean and intact without signs of breakdown Motor 3 to 3+ RUE and RLE,  5/5 on left  Musculoskeletal: Full range of motion in all 4 extremities. No joint swelling          Assessment/Plan: 1. Functional deficits secondary to Left pontine infarct  which require 3+ hours per day of interdisciplinary therapy in a comprehensive inpatient rehab setting.  Physiatrist is providing close team supervision and 24 hour management of active medical problems listed below.  Physiatrist and rehab team continue to assess barriers to discharge/monitor patient progress toward functional  and medical goals  Care Tool:  Bathing  Bathing activity did not occur:  (N/A) Body parts bathed by patient: Right arm, Chest, Abdomen, Front perineal area, Face, Right upper leg, Left upper leg, Left arm   Body parts bathed by helper: Buttocks, Right lower leg, Left lower leg     Bathing assist Assist Level: Moderate Assistance - Patient 50 - 74%     Upper Body Dressing/Undressing Upper body dressing   What is the patient wearing?: Pull over shirt    Upper body assist Assist Level: Minimal Assistance - Patient > 75%    Lower Body Dressing/Undressing Lower body dressing      What is the patient wearing?: Incontinence brief, Pants     Lower body assist Assist for lower body dressing: Maximal Assistance - Patient 25 - 49%     Toileting Toileting    Toileting assist Assist for toileting: Maximal Assistance - Patient 25 - 49% Assistive Device Comment: Bariatric BSC   Transfers Chair/bed transfer  Transfers assist  Chair/bed transfer activity did not occur: N/A  Chair/bed transfer assist level: Minimal Assistance - Patient > 75%     Locomotion Ambulation   Ambulation assist   Ambulation activity did not occur: N/A  Assist level: 2 helpers Assistive device: Walker-rolling Max distance: 15   Walk 10 feet activity   Assist  Walk 10 feet activity did not occur: N/A  Assist level: 2 helpers Assistive device: Walker-rolling   Walk 50 feet activity   Assist Walk 50 feet with 2 turns activity did not occur: N/A         Walk  150 feet activity   Assist Walk 150 feet activity did not occur: N/A         Walk 10 feet on uneven surface  activity   Assist Walk 10 feet on uneven surfaces activity did not occur: N/A         Wheelchair     Assist Will patient use wheelchair at discharge?: No (Per PT goals )      Wheelchair assist level: Moderate Assistance - Patient 50 - 74% Max wheelchair distance: 150    Wheelchair 50 feet with 2  turns activity    Assist        Assist Level: Moderate Assistance - Patient 50 - 74%   Wheelchair 150 feet activity     Assist      Assist Level: Moderate Assistance - Patient 50 - 74%   Blood pressure (!) 143/66, pulse 61, temperature 97.6 F (36.4 C), resp. rate 20, height 5\' 10"  (1.778 m), weight 129.3 kg, SpO2 95 %.  Medical Problem List and Plan: 1.  Right hemiparesis secondary to left pontine infarction.  Follow-up outpatient cardiology services to discuss loop recorder             -patient may shower             -ELOS/Goals: 10/12/supervision/min a             Continue CIR PT, OT,SLP  2.  Antithrombotics: -DVT/anticoagulation: SCDs             -antiplatelet therapy: Aspirin 325 mg daily 3. Pain Management: Tylenol as needed. Well controlled 4. Mood: Provide emotional support. In positive mood.              -antipsychotic agents: N/A 5. Neuropsych: This patient is capable of making decisions on his own behalf. 6. Skin/Wound Care: Routine skin checks 7. Fluids/Electrolytes/Nutrition: Routine in and outs.  Weekly CMP 8.  Hypertension.  Norvasc 10 mg daily, Cozaar 100 mg daily.        10/3: elevated. Cr normal. Will increase Lisinopril to 5mg . Discussed that this is similar class of medication to Cozaar, which he is tolerating well.     10/4-5 bp's better today, continue current plan. Discussed bp control again with patient toay. Vitals:   07/03/20 1937 07/04/20 0506  BP: (!) 158/76 (!) 143/66  Pulse: 72 61  Resp:    Temp: 97.8 F (36.6 C) 97.6 F (36.4 C)  SpO2: 96% 95%  9.  Diabetes mellitus with hyperglycemia.  Hemoglobin A1c 7.5.  SSI.          CBG (last 3)  Recent Labs    07/03/20 1655 07/03/20 2143 07/04/20 0606  GLUCAP 140* 100* 130*  10/3-5 mildly elevated: reviewed dietary habits, exercise, weight loss as means of improving control at home   10.  BPH.  Proscar.               emptying well 11.  Hyperlipidemia.  Lipitor--discussed rationale for  statin after stroke and in general. He wishes to hold regardless 12.  History of gout.  Zyloprim 300 mg daily.  Monitor for any gout flareups 13.  History of non-Hodgkin's lymphoma status post chemotherapy.  Follow-up Dr Randa Evens as outpatient. 14.  Morbid obesity.  BMI 40.89.  Dietary follow-up 15.  History of rectal outlet bleeding.  Felt to be related to possible hemorrhoids.  Hemoglobin hematocrit mains stable.  He was to follow-up outpatient gastroenterology services.  Patient had deferred GI work-up while in  the hospital. Hgb low nl 16. Constipation improved with laxatives: emptying regularly.      LOS: 20 days A FACE TO FACE EVALUATION WAS PERFORMED  Peter Ryan 07/04/2020, 10:34 AM

## 2020-07-04 NOTE — Progress Notes (Signed)
Occupational Therapy Session Note  Patient Details  Name: Neithan Day MRN: [id number] Date of Birth: 1951/05/20  Today's Date: 07/04/2020 OT Individual Time: 0947-0962 OT Individual Time Calculation (min): 55 min    Short Term Goals: Week 3:  OT Short Term Goal 1 (Week 3): STGs=LTGs due to ELOS  Skilled Therapeutic Interventions/Progress Updates:  Patient met lying supine in bed in agreement with OT treatment session with focus on self-care re-education and ADL transfers as detailed below. Supine to EOB with Mod A and sit to stand from EOB with Min A. Stand-pivot transfer to wc on L with Min A and min-mod cues for technique, posture and form. Patient ambulated short distance into bathroom in prep for toileting task. Stand-pivot transfer to wide DABSC with Min A. Patient continues to require Max assist with clothing/hygiene management during toileting tasks 2/2 decreased dynamic standing balance, R-sided weakness, and girth with cues for hand placement, posture and form. Seated on wide DABSC, patient able to doff/don UB clothing with Min A and doff/don LB clothing with use of reacher and assist to thread RLE and hike over hips in standing with use of bariatric RW. Patient able to stand to wash hands at sink level with focus on standing tolerance and static standing balance. Session concluded with patient seated in wc with call bell within reach and all needs met.   Therapy Documentation Precautions:  Precautions Precautions: Fall, Other (comment) Precaution Comments: R hemiparesis Restrictions Weight Bearing Restrictions: No Other Position/Activity Restrictions: protect weak RUE General:    Therapy/Group: Individual Therapy  Yamin Swingler R [place] 07/04/2020, 7:25 AM

## 2020-07-04 NOTE — Progress Notes (Signed)
Physical Therapy Session Note  Patient Details  Name: Peter Ryan MRN: [id number] Date of Birth: 10-23-1950  Today's Date: 07/04/2020 PT Individual Time: 1000-1100 PT Individual Time Calculation (min): 60 min   Short Term Goals: Week 3:  PT Short Term Goal 1 (Week 3): Pt will ambulate 9f with min assist and LRAD PT Short Term Goal 2 (Week 3): [facility] Short Term Goal 3 (Week 3): Pt will ascend 4 steps with BUE support and mod assist PT Short Term Goal 4 (Week 3): Pt will maintian balacne up to 3 min with min assist and LRAD  Skilled Therapeutic Interventions/Progress Updates: Pt presented in w/c agreeable to therapy. Pt denies pain during session. Pt transported to ortho gym for energy conservation. Participated in car transfer to 29in height vehicle. Pt was able to perform stand pivot to car with minA but required modA to scoot back to properly sit in seat and for BLE management. Pt required minA for BLE management out of car. Pt then ambulated 151fwith minA to mat and participated in scooting anteriorly/posteriorly and laterally with pt able to demonstrate some reciprocal hip activity when performing a-p scoots. Pt then performed stand pivot transfer to w/c and participated in w/c mobility x 12085fPt continues to require cues for use of LLE to assist with w/c propulsion and increasing R shoulder extension however is demonstrating improvement overall. Pt transported remaining distance back to room and remained in w/c at end of session. Pt left with belt alarm on, call bell within reach and needs met.      Therapy Documentation Precautions:  Precautions Precautions: Fall, Other (comment) Precaution Comments: R hemiparesis Restrictions Weight Bearing Restrictions: No Other Position/Activity Restrictions: protect weak RUE General:   Vital Signs: Therapy Vitals Temp: 97.8 F (36.6 C) Pulse Rate: 69 Resp: 18 BP: (!) 157/65 Patient  Position (if appropriate): Lying Oxygen Therapy SpO2: 98 %    Therapy/Group: Individual Therapy  Mariha Sleeper  Mana Morison, PTA  07/04/2020, 4:12 PM

## 2020-07-04 NOTE — Progress Notes (Signed)
Patient ID: Peter Ryan, male   DOB: 1950/10/30, 69 y.o.   MRN: [id number]   Sw spoke with patient to discuss preference of Home Health follow up. Patient has no preference. SW will send referral to [facility] and await decision, will update.   [place], [place]

## 2020-07-05 ENCOUNTER — Inpatient Hospital Stay (HOSPITAL_COMMUNITY): Payer: Medicare Other | Admitting: Physical Therapy

## 2020-07-05 ENCOUNTER — Inpatient Hospital Stay (HOSPITAL_COMMUNITY): Payer: Medicare Other | Admitting: Occupational Therapy

## 2020-07-05 ENCOUNTER — Ambulatory Visit: Payer: Medicare Other

## 2020-07-05 LAB — GLUCOSE, CAPILLARY
Glucose-Capillary: 114 mg/dL — ABNORMAL HIGH (ref 70–99)
Glucose-Capillary: 158 mg/dL — ABNORMAL HIGH (ref 70–99)
Glucose-Capillary: 121 mg/dL — ABNORMAL HIGH (ref 70–99)
Glucose-Capillary: 154 mg/dL — ABNORMAL HIGH (ref 70–99)

## 2020-07-05 NOTE — Progress Notes (Signed)
Patient ID: Peter Ryan, male   DOB: 01/13/1951, 69 y.o.   MRN: [id number]  RW and WC ordered through [facility].

## 2020-07-05 NOTE — Patient Care Conference (Signed)
Inpatient RehabilitationTeam Conference and Plan of Care Update Date: 07/05/2020   Time: 10:09 AM    Patient Name: Peter Ryan      Medical Record Number: [id number]  Date of Birth: 03/26/1951 Sex: Male         Room/Bed: 4W23C/4W23C-01 Payor Info: Payor: MEDICARE / Plan: MEDICARE PART A AND B / Product Type: *No Product type* /    Admit Date/Time:  06/14/2020  1:20 PM  Primary Diagnosis:  Left pontine cerebrovascular accident [facility])  Hospital Problems: Principal Problem:   Left pontine cerebrovascular accident [facility])    Expected Discharge Date: Expected Discharge Date: 07/11/20  Team Members Present: Physician leading conference: Dr. Alysia Penna Care Coodinator Present: Dorien Chihuahua, RN, BSN, CRRN;Christina Sampson Goon, BSW Nurse Present: Other (comment) (Marge L, RN) PT Present: Phylliss Bob, PTA OT Present: Turner Daniels, OT PPS Coordinator present : Gunnar Fusi, SLP     Current Status/Progress Goal Weekly Team Focus  Bowel/Bladder   pt cont of b and b.Pt has been refusing stool softner and laxitives. lbm- 07/03/20 was a smear pt stated he would like tocontinue stool softners  remain cont of b and b continue useof stool softner and laxatives.  assess q shift and prn, administermedication as needed and schedualed   Swallow/Nutrition/ Hydration             ADL's   Mod A bathing at shower level sit<stand, Min A UB dressing, Max-Total A LB dressing sit<stand, Mod A stand pivot shower transfer using grab bar  Min A overall  Rt NMR postural control, functional transfers, sit<stands with RW, pt/family education   Mobility   min-modA bed mobility, min-modA STS due to intermittent R posteriorlateral lean, sitting balance varies supervision to minA due to decreased awareness and R lateral lean, gait minA consistently 20-65ft, can go further but becomes more modA  minA overall  R NMR, gait, transfers, w/c mobility   Communication             Safety/Cognition/  Behavioral Observations            Pain   pt has no complaints of pain         Skin               Discharge Planning:  Discharging 10/12. 1 level home, 3 steps to enter (R side railings). Has rollater and cane   Team Discussion: Anxious to go home. Cont of B+B. CBGs WNL. Easily distracted, requires cues; verbal more than tactile to focus and for gait sequence and mid line orientation. Ramp recommended for discharge with wide DA BSC. HH follow up set up Patient on target to meet rehab goals: yes, supervision goals set overall  *See Care Plan and progress notes for long and short-term goals.   Revisions to Treatment Plan:  Discontinued SLP service last week Downgraded goals for gait Teaching Needs: Transfers, toileting, medications, etc.  Current Barriers to Discharge: Decreased caregiver support, access to home environment  Possible Resolutions to Barriers: Family education with wife 10/04 and 10/06     Medical Summary Current Status: borderline elevated sys BP, CBG control improved  Barriers to Discharge: Medical stability   Possible Resolutions to Barriers/Weekly Focus: cont glimipride, may need additional BP med adjustment   Continued Need for Acute Rehabilitation Level of Care: The patient requires daily medical management by a physician with specialized training in physical medicine and rehabilitation for the following reasons: Direction of a multidisciplinary physical rehabilitation program to maximize functional independence :  Yes Medical management of patient stability for increased activity during participation in an intensive rehabilitation regime.: Yes Analysis of laboratory values and/or radiology reports with any subsequent need for medication adjustment and/or medical intervention. : Yes   I attest that I was present, lead the team conference, and concur with the assessment and plan of the team.   Dorien Chihuahua B 07/05/2020, 1:35 PM

## 2020-07-05 NOTE — Progress Notes (Signed)
Occupational Therapy Session Note  Patient Details  Name: Peter Ryan MRN: [id number] Date of Birth: [birth year]  Today's Date: 07/05/2020 OT Individual Time: 9198-0221 OT Individual Time Calculation (min): 43 min    Short Term Goals: Week 3:  OT Short Term Goal 1 (Week 3): STGs=LTGs due to ELOS  Skilled Therapeutic Interventions/Progress Updates:  Patient met seated in wc in agreement with OT treatment session with focus on family education. 0/10 pain reported at rest and with activity. Wife present at bedside. OT provided home measurement sheet for wife to fill out in prep for safe d/c home. Total A for wc transport to ADL apartment. Simulated home bathroom set-up with OT providing education on toilet transfers using bariatric DABSC. Wife able to assist patient from [facility] to wc with appropriate cueing and repeat reminders from OT. Return to room in same manner as above. Session concluded with patient seated in wc with call bell within reach and all needs met.   Therapy Documentation Precautions:  Precautions Precautions: Fall, Other (comment) Precaution Comments: R hemiparesis Restrictions Weight Bearing Restrictions: No Other Position/Activity Restrictions: protect weak RUE General:    Therapy/Group: Individual Therapy  Peter Ryan R Howerton-Davis 07/05/2020, 7:50 AM

## 2020-07-05 NOTE — Progress Notes (Signed)
Physical Therapy Session Note  Patient Details  Name: Peter Ryan MRN: [id number] Date of Birth: 12/23/1950  Today's Date: 07/05/2020 PT Individual Time: 1420-1530   and 70 min  Short Term Goals: Week 3:  PT Short Term Goal 1 (Week 3): Pt will ambulate 75f with min assist and LRAD PT Short Term Goal 2 (Week 3): [facility] PT Short Term Goal 3 (Week 3): Pt will ascend 4 steps with BUE support and mod assist PT Short Term Goal 4 (Week 3): Pt will maintian balacne up to 3 min with min assist and LRAD  Skilled Therapeutic Interventions/Progress Updates:   Pt received supine in bed and agreeable to PT. Supine>sit transfer with mod assist and cues for.    [facility]. [facility] with wide RW 262fx 4 with RW and min assist from PT and then from spouse.   Stair management training with BUE support 2 rails x 4 with min assist from PT to guide RLE down steps due to scissoring. Pt then performed ascend with RLE/descent with L and mod assist due ot RLE weakness. Ascent/descent of 2 steps with BUE supprt on 1 rail with min-mod assist for safety.    Ramp access training with wife to push WC up ramp x 2 with cues for posterior descent to improved safety and prevent loss of control. WC mobility with tband on the R x 5045fith min assist and moderate cues for attention to the right UE   Patient returned to room and left [facility] reach and all needs met.              Therapy Documentation Precautions:  Precautions Precautions: Fall, Other (comment) Precaution Comments: R hemiparesis Restrictions Weight Bearing Restrictions: No Other Position/Activity Restrictions: protect weak RUE   Pain: denies Therapy/Group: Individual Therapy  AusLorie Phenix/02/2020, 10:21 AM

## 2020-07-05 NOTE — Progress Notes (Signed)
Physical Therapy Session Note  Patient Details  Name: Peter Ryan MRN: [id number] Date of Birth: 03-Jan-1951  Today's Date: 07/05/2020 PT Individual Time: 0805-0900 and 2620-3559 PT Individual Time Calculation (min): 55 min and 30 min  Short Term Goals: Week 3:  PT Short Term Goal 1 (Week 3): Pt will ambulate 65f with min assist and LRAD PT Short Term Goal 2 (Week 3): [facility] PT Short Term Goal 3 (Week 3): Pt will ascend 4 steps with BUE support and mod assist PT Short Term Goal 4 (Week 3): Pt will maintian balacne up to 3 min with min assist and LRAD  Skilled Therapeutic Interventions/Progress Updates: Tx1: Pt presented in bed agreeable to therapy. Pt denies pain during session. Performed bed mobility with minA for truncal support. PTA donned shoes total A for time management. Pt performed stand pivot transfer to w/c requiring minA for STS and mod verbal cues for midline orientation. Pt's wife arrived and discussed car seat 27in. Pt transported to ortho gym for energy conservation and set up with 27in height for car transfer. Pt required minA for transfer and mod A for scooting R hip back to allow sufficient space for BLE. Pt continues to require modA for BLE management to place in vehicle. Pt required minA to get BLE out of vehicle and ambulated x157fto mat with min nearing modA due to poor sequencing and RW. Pt then participated in STS without AD x 5 for static balance. Pt transported back to room at end of session and remained in w/c with belt alarm on, call bell within reach and needs met.    Tx2: Pt presented in w/c agreeable to therapy. Pt denies pain during session. Session focused on w/c management with wife and pt. Pt previously voiced concerns regarding wife being able to push w/c up ramp therefore [facility] and participated in w/c mobility in community setting. Wife was able to mange w/c on uneven surfaces and  up/down ramps with increased time and effort however no significant difficulty. Pt then returned back to unit and participated in w/c mobility via hemi-technique. Pt demonstrated improved coordination this session and was able to propel longer distances in more of a straight trajectory however required increased cues with fatigue. Pt transported back to room at end of session and performed stand pivot transfer to bed with modA for STS initially upon standing due to increased R lean with transfer itself requiring minA. Pt required modA for sit to supine for BLE. Pt left in bed at end of session with bed alarm on, call bell within reach and needs met.      Therapy Documentation Precautions:  Precautions Precautions: Fall, Other (comment) Precaution Comments: R hemiparesis Restrictions Weight Bearing Restrictions: No Other Position/Activity Restrictions: protect weak RUE General:   Vital Signs:  Pain:   Mobility:   Locomotion :    Trunk/Postural Assessment :    Balance:   Exercises:   Other Treatments:      Therapy/Group: Individual Therapy  Peter Ryan 07/05/2020, 12:49 PM

## 2020-07-05 NOTE — Progress Notes (Signed)
Patient ID: Peter Ryan, male   DOB: 03-Sep-1951, 69 y.o.   MRN: [id number] Team Conference Report to Patient/Family  Team Conference discussion was reviewed with the patient and caregiver, including goals, any changes in plan of care and target discharge date.  Patient and caregiver express understanding and are in agreement.  The patient has a target discharge date of 07/11/20.  Dyanne Iha 07/05/2020, 1:34 PM

## 2020-07-05 NOTE — Progress Notes (Addendum)
[place] PHYSICAL MEDICINE & REHABILITATION PROGRESS NOTE   Subjective/Complaints: No issues overnight, wife is in room, discussed follow-up plans as well as care team discharge conference today. ROS: Patient denies CP, SOB, N/V/D Objective:   No results found. No results for input(s): WBC, HGB, HCT, PLT in the last 72 hours. No results for input(s): NA, K, CL, CO2, GLUCOSE, BUN, CREATININE, CALCIUM in the last 72 hours.  Intake/Output Summary (Last 24 hours) at 07/05/2020 0832 Last data filed at 07/05/2020 0740 Gross per 24 hour  Intake 660 ml  Output 975 ml  Net -315 ml       Physical Exam: Vital Signs Blood pressure (!) 143/67, pulse 65, temperature 97.6 F (36.4 C), resp. rate 18, height $RemoveBe'5\' 10"'CDDjOeDWa$  (1.778 m), weight 129.3 kg, SpO2 98 %.   General: No acute distress Mood and affect are appropriate Heart: Regular rate and rhythm no rubs murmurs or extra sounds Lungs: Clear to auscultation, breathing unlabored, no rales or wheezes Abdomen: Positive bowel sounds, soft nontender to palpation, nondistended Extremities: No clubbing, cyanosis, or edema Skin: No evidence of breakdown, no evidence of rash   Motor 3 to 3+ RUE and RLE,  5/5 on left  Musculoskeletal: Full passiverange of motion in all 4 extremities, reduced active range of motion right upper extremity. No joint swelling          Assessment/Plan: 1. Functional deficits secondary to Left pontine infarct  which require 3+ hours per day of interdisciplinary therapy in a comprehensive inpatient rehab setting.  Physiatrist is providing close team supervision and 24 hour management of active medical problems listed below.  Physiatrist and rehab team continue to assess barriers to discharge/monitor patient progress toward functional and medical goals  Care Tool:  Bathing  Bathing activity did not occur:  (N/A) Body parts bathed by patient: Right arm, Chest, Abdomen, Front perineal area, Face, Right upper leg,  Left upper leg, Left arm   Body parts bathed by helper: Buttocks, Right lower leg, Left lower leg     Bathing assist Assist Level: Moderate Assistance - Patient 50 - 74%     Upper Body Dressing/Undressing Upper body dressing   What is the patient wearing?: Pull over shirt    Upper body assist Assist Level: Minimal Assistance - Patient > 75%    Lower Body Dressing/Undressing Lower body dressing      What is the patient wearing?: Incontinence brief, Pants     Lower body assist Assist for lower body dressing: Maximal Assistance - Patient 25 - 49%     Toileting Toileting    Toileting assist Assist for toileting: Maximal Assistance - Patient 25 - 49% Assistive Device Comment: Bariatric BSC   Transfers Chair/bed transfer  Transfers assist  Chair/bed transfer activity did not occur: N/A  Chair/bed transfer assist level: Minimal Assistance - Patient > 75%     Locomotion Ambulation   Ambulation assist   Ambulation activity did not occur: N/A  Assist level: 2 helpers Assistive device: Walker-rolling Max distance: 15   Walk 10 feet activity   Assist  Walk 10 feet activity did not occur: N/A  Assist level: 2 helpers Assistive device: Walker-rolling   Walk 50 feet activity   Assist Walk 50 feet with 2 turns activity did not occur: N/A         Walk 150 feet activity   Assist Walk 150 feet activity did not occur: N/A         Walk 10 feet on uneven surface  activity   Assist Walk 10 feet on uneven surfaces activity did not occur: N/A         Wheelchair     Assist Will patient use wheelchair at discharge?: No (Per PT goals )      Wheelchair assist level: Moderate Assistance - Patient 50 - 74% Max wheelchair distance: 150    Wheelchair 50 feet with 2 turns activity    Assist        Assist Level: Moderate Assistance - Patient 50 - 74%   Wheelchair 150 feet activity     Assist      Assist Level: Moderate Assistance -  Patient 50 - 74%   Blood pressure (!) 143/67, pulse 65, temperature 97.6 F (36.4 C), resp. rate 18, height $RemoveBe'5\' 10"'GhiLZqdSQ$  (1.778 m), weight 129.3 kg, SpO2 98 %.  Medical Problem List and Plan: 1.  Right hemiparesis secondary to left pontine infarction.  Follow-up outpatient cardiology services to discuss loop recorder             -patient may shower             -ELOS/Goals: 10/12             Team conference today please see physician documentation under team conference tab, met with team  to discuss problems,progress, and goals. Formulized individual treatment plan based on medical history, underlying problem and comorbidities.  2.  Antithrombotics: -DVT/anticoagulation: SCDs             -antiplatelet therapy: Aspirin 325 mg daily 3. Pain Management: Tylenol as needed. Well controlled 4. Mood: Provide emotional support. In positive mood.              -antipsychotic agents: N/A 5. Neuropsych: This patient is capable of making decisions on his own behalf. 6. Skin/Wound Care: Routine skin checks 7. Fluids/Electrolytes/Nutrition: Routine in and outs.  Weekly CMP 8.  Hypertension.  Norvasc 10 mg daily, Cozaar 100 mg daily.    Vitals:   07/04/20 1932 07/05/20 0531  BP: (!) 147/73 (!) 143/67  Pulse: 69 65  Resp:    Temp: 98.1 F (36.7 C) 97.6 F (36.4 C)  SpO2: 97% 98%  9.  Diabetes mellitus with hyperglycemia.  Hemoglobin A1c 7.5.  SSI.          CBG (last 3)  Recent Labs    07/04/20 1641 07/04/20 2129 07/05/20 0619  GLUCAP 103* 123* 114*  controlled 10/6 10.  BPH.  Proscar.               emptying well 11.  Hyperlipidemia.  Lipitor--discussed rationale for statin after stroke and in general. He wishes to hold regardless 12.  History of gout.  Zyloprim 300 mg daily.  Monitor for any gout flareups 13.  History of non-Hodgkin's lymphoma status post chemotherapy.  Follow-up Dr Randa Evens as outpatient. 14.  Morbid obesity.  BMI 40.89.  Dietary follow-up 15.  History of rectal outlet  bleeding.  Felt to be related to possible hemorrhoids.f/u  Hgb normal 16. Constipation improved with laxatives: emptying regularly. Generally continent, but had one inc stool yesterday      LOS: 21 days A FACE TO FACE EVALUATION WAS PERFORMED  Charlett Blake 07/05/2020, 8:32 AM

## 2020-07-06 ENCOUNTER — Other Ambulatory Visit: Payer: Medicare Other

## 2020-07-06 ENCOUNTER — Inpatient Hospital Stay (HOSPITAL_COMMUNITY): Payer: Medicare Other | Admitting: Occupational Therapy

## 2020-07-06 ENCOUNTER — Inpatient Hospital Stay (HOSPITAL_COMMUNITY): Payer: Medicare Other | Admitting: Physical Therapy

## 2020-07-06 ENCOUNTER — Ambulatory Visit: Payer: Medicare Other | Admitting: Oncology

## 2020-07-06 LAB — GLUCOSE, CAPILLARY
Glucose-Capillary: 127 mg/dL — ABNORMAL HIGH (ref 70–99)
Glucose-Capillary: 114 mg/dL — ABNORMAL HIGH (ref 70–99)
Glucose-Capillary: 164 mg/dL — ABNORMAL HIGH (ref 70–99)
Glucose-Capillary: 137 mg/dL — ABNORMAL HIGH (ref 70–99)

## 2020-07-06 NOTE — Progress Notes (Addendum)
Occupational Therapy Session Note  Patient Details  Name: Peter Ryan MRN: [id number] Date of Birth: [birth year]  Today's Date: 07/06/2020 OT Individual Time: 9242-6834 OT Individual Time Calculation (min): 73 min    Short Term Goals: Week 3:  OT Short Term Goal 1 (Week 3): STGs=LTGs due to ELOS  Skilled Therapeutic Interventions/Progress Updates:  Patient met seated in wc in agreement with OT treatment session with focus on self-care re-education, RUE NMR, and ADL transfers as detailed below. 0/10 pain at rest and with activity. Walk-in shower transfer with Min A and use of bariatric RW. Patient able to stand to doff underwear and pants with assist to maintain standing balance only. Patient able to bathe UB with use of R hand mit and verbal cues for hemi technique. Patient able to bathe/dry upper thighs, lower thighs, front perineal area and buttocks in sitting on bariatric DABSC with use of long-handled sponge. Seated on BSC, patient able to don UB clothing with use of hemi technique and Min A. LB dressing with Mod A to thread RLE and hike clothing over hips in standing. Patient demonstrates increased functional use of RUE with ADL tasks and verbal cues. Seated in wc, patient performed 1 set x15 reps each of self-ROM with non-affected UE assisting affected RUE. Session concluded with patient seated in wc with call bell within reach and all needs met.   Therapy Documentation Precautions:  Precautions Precautions: Fall, Other (comment) Precaution Comments: R hemiparesis Restrictions Weight Bearing Restrictions: No Other Position/Activity Restrictions: protect weak RUE General:    Therapy/Group: Individual Therapy  Lynnelle Mesmer R Howerton-Davis 07/06/2020, 7:29 AM

## 2020-07-06 NOTE — Progress Notes (Signed)
Initial Nutrition Assessment  DOCUMENTATION CODES:   Morbid obesity  INTERVENTION:   - Continue Ensure Max po BID, each supplement provides 150 kcal and 30 grams of protein  - Diabetes diet education provided on 9/21, RD reinforced education today  NUTRITION DIAGNOSIS:   Increased nutrient needs related to other (therapies) as evidenced by estimated needs.  GOAL:   Patient will meet greater than or equal to 90% of their needs  MONITOR:   PO intake, Supplement acceptance, Labs, Weight trends  REASON FOR ASSESSMENT:   Consult Assessment of nutrition requirement/status  ASSESSMENT:   69 year old male with PMH of BPH, HTN, DM, HLD, and non-Hodgkin's lymphoma s/p chemotherapy currently in remission. Pt presented on 06/05/20 with right hemiparesis and dysarthria. MRI of the brain showed moderate sized left pontine infarct, no associated hemorrhage or mass-effect. Admitted to CIR on 9/15.   Noted target d/c date of 10/12.  Spoke with pt and wife at bedside. Pt reports appetite is good and that he is eating well. Pt denies any nutrition-related concerns at this time. Pt drinking Ensure Max supplements without issue.  Reinforced diabetes diet education with pt and wife during visit.  No new weights since 06/14/20.  Meal Completion: 75-100%  Medications reviewed and include: cholecalciferol, SSI, miralax, Ensure Max BID, senna, vitamin B-12  Labs reviewed. CBG's: 114-154 x 24 hours  NUTRITION - FOCUSED PHYSICAL EXAM:    Most Recent Value  Orbital Region No depletion  Upper Arm Region No depletion  Thoracic and Lumbar Region No depletion  Buccal Region No depletion  Temple Region No depletion  Clavicle Bone Region No depletion  Clavicle and Acromion Bone Region No depletion  Scapular Bone Region No depletion  Dorsal Hand No depletion  Patellar Region No depletion  Anterior Thigh Region No depletion  Posterior Calf Region No depletion  Edema (RD Assessment) None  Hair  Reviewed  Eyes Reviewed  Mouth Reviewed  Skin Reviewed  Nails Reviewed       Diet Order:   Diet Order            Diet heart healthy/carb modified Room service appropriate? Yes; Fluid consistency: Thin  Diet effective now                 EDUCATION NEEDS:   Education needs have been addressed  Skin:  Skin Assessment: Reviewed RN Assessment  Last BM:  07/04/20  Height:   Ht Readings from Last 1 Encounters:  06/14/20 5\' 10"  (1.778 m)    Weight:   Wt Readings from Last 1 Encounters:  06/14/20 129.3 kg    BMI:  Body mass index is 40.89 kg/m.  Estimated Nutritional Needs:   Kcal:  2500-2700  Protein:  120-135 grams  Fluid:  >/= 2.2 L    Gaynell Face, MS, RD, LDN Inpatient Clinical Dietitian Please see AMiON for contact information.

## 2020-07-06 NOTE — Progress Notes (Signed)
[place] PHYSICAL MEDICINE & REHABILITATION PROGRESS NOTE   Subjective/Complaints: .No issues overnite , discussed fluid intake, constipation  ROS: Patient denies CP, SOB, N/V/D Objective:   No results found. No results for input(s): WBC, HGB, HCT, PLT in the last 72 hours. No results for input(s): NA, K, CL, CO2, GLUCOSE, BUN, CREATININE, CALCIUM in the last 72 hours.  Intake/Output Summary (Last 24 hours) at 07/06/2020 0802 Last data filed at 07/06/2020 0726 Gross per 24 hour  Intake 400 ml  Output 750 ml  Net -350 ml       Physical Exam: Vital Signs Blood pressure 139/67, pulse 66, temperature 98.1 F (36.7 C), resp. rate 18, height 5\' 10"  (1.778 m), weight 129.3 kg, SpO2 97 %.    General: No acute distress Mood and affect are appropriate Heart: Regular rate and rhythm no rubs murmurs or extra sounds Lungs: Clear to auscultation, breathing unlabored, no rales or wheezes Abdomen: Positive bowel sounds, soft nontender to palpation, nondistended Extremities: No clubbing, cyanosis, or edema Skin: No evidence of breakdown, no evidence of rash  Motor 3 to 3+ RUE and RLE,  5/5 on left  Musculoskeletal: Full passiverange of motion in all 4 extremities, reduced active range of motion right upper extremity. No joint swelling  Assessment/Plan: 1. Functional deficits secondary to Left pontine infarct  which require 3+ hours per day of interdisciplinary therapy in a comprehensive inpatient rehab setting.  Physiatrist is providing close team supervision and 24 hour management of active medical problems listed below.  Physiatrist and rehab team continue to assess barriers to discharge/monitor patient progress toward functional and medical goals  Care Tool:  Bathing  Bathing activity did not occur:  (N/A) Body parts bathed by patient: Right arm, Chest, Abdomen, Front perineal area, Face, Right upper leg, Left upper leg, Left arm   Body parts bathed by helper: Buttocks, Right  lower leg, Left lower leg     Bathing assist Assist Level: Moderate Assistance - Patient 50 - 74%     Upper Body Dressing/Undressing Upper body dressing   What is the patient wearing?: Pull over shirt    Upper body assist Assist Level: Minimal Assistance - Patient > 75%    Lower Body Dressing/Undressing Lower body dressing      What is the patient wearing?: Incontinence brief, Pants     Lower body assist Assist for lower body dressing: Maximal Assistance - Patient 25 - 49%     Toileting Toileting    Toileting assist Assist for toileting: Maximal Assistance - Patient 25 - 49% Assistive Device Comment: Bariatric BSC   Transfers Chair/bed transfer  Transfers assist  Chair/bed transfer activity did not occur: N/A  Chair/bed transfer assist level: Minimal Assistance - Patient > 75%     Locomotion Ambulation   Ambulation assist   Ambulation activity did not occur: N/A  Assist level: 2 helpers Assistive device: Walker-rolling Max distance: 15   Walk 10 feet activity   Assist  Walk 10 feet activity did not occur: N/A  Assist level: 2 helpers Assistive device: Walker-rolling   Walk 50 feet activity   Assist Walk 50 feet with 2 turns activity did not occur: N/A         Walk 150 feet activity   Assist Walk 150 feet activity did not occur: N/A         Walk 10 feet on uneven surface  activity   Assist Walk 10 feet on uneven surfaces activity did not occur: N/A  Wheelchair     Assist Will patient use wheelchair at discharge?: No (Per PT goals )      Wheelchair assist level: Moderate Assistance - Patient 50 - 74% Max wheelchair distance: 150    Wheelchair 50 feet with 2 turns activity    Assist        Assist Level: Moderate Assistance - Patient 50 - 74%   Wheelchair 150 feet activity     Assist      Assist Level: Moderate Assistance - Patient 50 - 74%   Blood pressure 139/67, pulse 66, temperature 98.1  F (36.7 C), resp. rate 18, height 5\' 10"  (1.778 m), weight 129.3 kg, SpO2 97 %.  Medical Problem List and Plan: 1.  Right hemiparesis secondary to left pontine infarction.  Follow-up outpatient cardiology services to discuss loop recorder             -patient may shower             -ELOS/Goals: 10/12      2.  Antithrombotics: -DVT/anticoagulation: SCDs             -antiplatelet therapy: Aspirin 325 mg daily 3. Pain Management: Tylenol as needed. Well controlled 4. Mood: Provide emotional support. In positive mood.              -antipsychotic agents: N/A 5. Neuropsych: This patient is capable of making decisions on his own behalf. 6. Skin/Wound Care: Routine skin checks 7. Fluids/Electrolytes/Nutrition: Routine in and outs.  Weekly CMP 8.  Hypertension.  Norvasc 10 mg daily, Cozaar 100 mg daily.    Vitals:   07/05/20 1953 07/06/20 0547  BP: (!) 157/72 139/67  Pulse: 75 66  Resp: 18 18  Temp: 98.3 F (36.8 C) 98.1 F (36.7 C)  SpO2: 96% 97%  9.  Diabetes mellitus with hyperglycemia.  Hemoglobin A1c 7.5.  SSI.          CBG (last 3)  Recent Labs    07/05/20 1651 07/05/20 2110 07/06/20 0549  GLUCAP 121* 154* 127*  controlled 10/7 10.  BPH.  Proscar.               emptying well 11.  Hyperlipidemia.  Lipitor--discussed rationale for statin after stroke and in general. He wishes to hold regardless 12.  History of gout.  Zyloprim 300 mg daily.  Monitor for any gout flareups 13.  History of non-Hodgkin's lymphoma status post chemotherapy.  Follow-up Dr Randa Evens as outpatient. 14.  Morbid obesity.  BMI 40.89.  Dietary follow-up 15.  History of rectal outlet bleeding.  Felt to be related to possible hemorrhoids.f/u  Hgb normal 16. Constipation improved with laxatives: emptying regularly. Generally continent, but had one inc stool yesterday      LOS: 22 days A FACE TO FACE EVALUATION WAS PERFORMED  Charlett Blake 07/06/2020, 8:02 AM

## 2020-07-06 NOTE — Progress Notes (Signed)
Physical Therapy Weekly Progress Note  Patient Details  Name: Peter Ryan MRN: [id number] Date of Birth: August 05, 1951  Beginning of progress report period: June 29, 2020 End of progress report period: July 06, 2020  Today's Date: 07/06/2020 PT Individual Time: 1105-1200 and 1630-1715  PT Individual Time Calculation (min): 55 min and 45 min   Patient has met 4 of 4 short term goals.  Pt is making steady progress towards LTG of min assist overall.  Improved attention. Midline orientation, and R sided coordination have allowed progress to  mod assist bed mobility. Min assist sit<>stand and stand pivot tranfers, and gait up to 97ft with min assist.  Patient continues to demonstrate the following deficits muscle weakness and muscle joint tightness, decreased cardiorespiratoy endurance, impaired timing and sequencing, unbalanced muscle activation, motor apraxia, decreased coordination and decreased motor planning, decreased attention to right, decreased attention, decreased awareness, decreased problem solving, decreased safety awareness, decreased memory and delayed processing and decreased sitting balance, decreased standing balance, decreased postural control, hemiplegia and decreased balance strategies and therefore will continue to benefit from skilled PT intervention to increase functional independence with mobility.  Patient progressing toward long term goals..  Continue plan of care.  PT Short Term Goals Week 3:  PT Short Term Goal 1 (Week 3): Pt will ambulate 41ft with min assist and LRAD PT Short Term Goal 1 - Progress (Week 3): Met PT Short Term Goal 2 (Week 3): Pt will transfer to and from [facility] with min assist PT Short Term Goal 2 - Progress (Week 3): Met PT Short Term Goal 3 (Week 3): Pt will ascend 4 steps with BUE support and mod assist PT Short Term Goal 3 - Progress (Week 3): Met PT Short Term Goal 4 (Week 3): Pt will maintian balacne up to 3 min with min assist and LRAD PT  Short Term Goal 4 - Progress (Week 3): Met Week 4:  PT Short Term Goal 1 (Week 4): STG=LTG due to ELOS  Skilled Therapeutic Interventions/Progress Updates:  Session 1  Pt received sitting in WC and agreeable to PT. Pt transported to entrance of hospital in [facility]. Car transfer training performed with min assist from PT and than with min assist from Wife. Moderate multimodal cues for improved AD management, attention to task, motor planning, and use of car handle/dash at proper time to improve safety and effectiveness of transfer. PT educated wife on [facility] parts management to load in and out of car.   Pt transported back to rehab gym. Gait training with RW x 38ft with min fading to mod assist for the last 72ft due to fatigue, increasing R lateral lean and poor attention to task. Patient returned to room and left sitting in [facility] with call bell in reach and all needs met.     Session 2.  Pt received supine in bed and agreeable to PT. Supine>sit transfer with min assist and increased time to allow positioning of the RUE on EOB and then coordination of trunk and UE to rotate with push into sitting.   Gait training with RW x 17ft with min assist and moderate cues for sequencing with turning to sit in arm chair.   Dynamic balance training instructed by PT while engaged in fine motor, gross motor task of wii resort bowling. Pt able to tolerate standign 5 frames x 2 frames prior to requesting rest. Min-moderate cues for attention to task and attention to the RUE/RLE to prevent LOB to the R throughout balance training. Cues  also provided for proper use of AD and Ad management to prevent anterior LOB. Patient returned to room and left sitting in [facility] with call bell in reach and all needs met.          Therapy Documentation Precautions:  Precautions Precautions: Fall, Other (comment) Precaution Comments: R hemiparesis Restrictions Weight Bearing Restrictions: No Other Position/Activity Restrictions: protect weak  RUE    Pain:   denies  Therapy/Group: Individual Therapy  Lorie Phenix 07/06/2020, 12:05 PM

## 2020-07-06 NOTE — Plan of Care (Signed)
  Problem: RH BOWEL ELIMINATION Goal: RH STG MANAGE BOWEL WITH ASSISTANCE Description: STG Manage Bowel with min Assistance. Outcome: Progressing Goal: RH STG MANAGE BOWEL W/MEDICATION W/ASSISTANCE Description: STG Manage Bowel with Medication with min Assistance. Outcome: Progressing   Problem: RH BLADDER ELIMINATION Goal: RH STG MANAGE BLADDER WITH ASSISTANCE Description: STG Manage Bladder With min Assistance Outcome: Progressing   

## 2020-07-06 NOTE — Discharge Instructions (Signed)
Inpatient Rehab Discharge Instructions  Peter Ryan Discharge date and time: No discharge date for patient encounter.   Activities/Precautions/ Functional Status: Activity: activity as tolerated Diet: diabetic diet Wound Care: Routine skin checks Functional status:  ___ No restrictions     ___ Walk up steps independently ___ 24/7 supervision/assistance   ___ Walk up steps with assistance ___ Intermittent supervision/assistance  ___ Bathe/dress independently ___ Walk with walker     _x__ Bathe/dress with assistance ___ Walk Independently    ___ Shower independently ___ Walk with assistance    ___ Shower with assistance ___ No alcohol     ___ Return to work/school ________ COMMUNITY REFERRALS UPON DISCHARGE:    Home Health:   PT     OT                     Agency: [facility] Phone: [phone]   Medical Equipment/Items Ordered: Wheelchairs, Conservation officer, nature, Bedside Commode, Tub Producer, television/film/video                                                 Agency/Supplier: [employer]  Special Instructions:  No driving smoking or alcohol STROKE/TIA DISCHARGE INSTRUCTIONS SMOKING Cigarette smoking nearly doubles your risk of having a stroke & is the single most alterable risk factor  If you smoke or have smoked in the last 12 months, you are advised to quit smoking for your health.  Most of the excess cardiovascular risk related to smoking disappears within a year of stopping.  Ask you doctor about anti-smoking medications  [place] Quit Line: 1-800-QUIT NOW  Free Smoking Cessation Classes [phone]  CHOLESTEROL Know your levels; limit fat & cholesterol in your diet  Lipid Panel     Component Value Date/Time   CHOL 170 06/06/2020 0413   CHOL 207 (H) 05/09/2020 1000   TRIG 321 (H) 06/06/2020 0413   HDL 29 (L) 06/06/2020 0413   HDL 30 (L) 05/09/2020 1000   CHOLHDL 5.9 06/06/2020 0413   VLDL 64 (H) 06/06/2020 0413   LDLCALC 77 06/06/2020 0413   LDLCALC 94 05/09/2020 1000       Many patients benefit from treatment even if their cholesterol is at goal.  Goal: Total Cholesterol (CHOL) less than 160  Goal:  Triglycerides (TRIG) less than 150  Goal:  HDL greater than 40  Goal:  LDL (LDLCALC) less than 100   BLOOD PRESSURE American Stroke Association blood pressure target is less that 120/80 mm/Hg  Your discharge blood pressure is:  BP: (!) 150/71  Monitor your blood pressure  Limit your salt and alcohol intake  Many individuals will require more than one medication for high blood pressure  DIABETES (A1c is a blood sugar average for last 3 months) Goal HGBA1c is under 7% (HBGA1c is blood sugar average for last 3 months)  Diabetes:    Lab Results  Component Value Date   HGBA1C 7.5 (H) 06/06/2020     Your HGBA1c can be lowered with medications, healthy diet, and exercise.  Check your blood sugar as directed by your physician  Call your physician if you experience unexplained or low blood sugars.  PHYSICAL ACTIVITY/REHABILITATION Goal is 30 minutes at least 4 days per week  Activity: Increase activity slowly, Therapies: Occupational Therapy: Home Health Return to work:   Activity decreases your risk of heart attack  and stroke and makes your heart stronger.  It helps control your weight and blood pressure; helps you relax and can improve your mood.  Participate in a regular exercise program.  Talk with your doctor about the best form of exercise for you (dancing, walking, swimming, cycling).  DIET/WEIGHT Goal is to maintain a healthy weight  Your discharge diet is:  Diet Order            Diet heart healthy/carb modified Room service appropriate? Yes; Fluid consistency: Thin  Diet effective now                 liquids Your height is:  Height: 5\' 10"  (177.8 cm) Your current weight is: Weight: 129.3 kg Your Body Mass Index (BMI) is:  BMI (Calculated): 40.89  Following the type of diet specifically designed for you will help prevent another  stroke.  Your goal weight range is:    Your goal Body Mass Index (BMI) is 19-24.  Healthy food habits can help reduce 3 risk factors for stroke:  High cholesterol, hypertension, and excess weight.  RESOURCES Stroke/Support Group:  Call [phone]   STROKE EDUCATION PROVIDED/REVIEWED AND GIVEN TO PATIENT Stroke warning signs and symptoms How to activate emergency medical system (call 911). Medications prescribed at discharge. Need for follow-up after discharge. Personal risk factors for stroke. Pneumonia vaccine given:  Flu vaccine given:  My questions have been answered, the writing is legible, and I understand these instructions.  I will adhere to these goals & educational materials that have been provided to me after my discharge from the hospital.     My questions have been answered and I understand these instructions. I will adhere to these goals and the provided educational materials after my discharge from the hospital.  Patient/Caregiver Signature _______________________________ Date __________  Clinician Signature _______________________________________ Date __________  Please bring this form and your medication list with you to all your follow-up doctor's appointments.

## 2020-07-07 ENCOUNTER — Inpatient Hospital Stay (HOSPITAL_COMMUNITY): Payer: Medicare Other | Admitting: Physical Therapy

## 2020-07-07 ENCOUNTER — Inpatient Hospital Stay (HOSPITAL_COMMUNITY): Payer: Medicare Other | Admitting: Occupational Therapy

## 2020-07-07 ENCOUNTER — Ambulatory Visit: Payer: Medicare Other

## 2020-07-07 LAB — BASIC METABOLIC PANEL
Anion gap: 10 (ref 5–15)
BUN: 12 mg/dL (ref 8–23)
CO2: 29 mmol/L (ref 22–32)
Calcium: 8.9 mg/dL (ref 8.9–10.3)
Chloride: 102 mmol/L (ref 98–111)
Creatinine, Ser: 0.84 mg/dL (ref 0.61–1.24)
GFR calc non Af Amer: >60 mL/min (ref >60)
Glucose, Bld: 164 mg/dL — ABNORMAL HIGH (ref 70–99)
Potassium: 3 mmol/L — ABNORMAL LOW (ref 3.5–5.1)
Sodium: 141 mmol/L (ref 135–145)

## 2020-07-07 LAB — GLUCOSE, CAPILLARY
Glucose-Capillary: 108 mg/dL — ABNORMAL HIGH (ref 70–99)
Glucose-Capillary: 132 mg/dL — ABNORMAL HIGH (ref 70–99)
Glucose-Capillary: 136 mg/dL — ABNORMAL HIGH (ref 70–99)
Glucose-Capillary: 118 mg/dL — ABNORMAL HIGH (ref 70–99)

## 2020-07-07 MED ORDER — HYDROCHLOROTHIAZIDE 12.5 MG PO CAPS
12.5000 mg | ORAL_CAPSULE | Freq: Every day | ORAL | Status: DC
  Administered 2020-07-07: 12.5 mg via ORAL
  Filled 2020-07-07: qty 1

## 2020-07-07 MED ORDER — CLONIDINE HCL 0.1 MG PO TABS
0.1000 mg | ORAL_TABLET | Freq: Two times a day (BID) | ORAL | Status: DC
  Administered 2020-07-07 – 2020-07-11 (×9): 0.1 mg via ORAL
  Filled 2020-07-07 (×9): qty 1

## 2020-07-07 MED ORDER — POTASSIUM CHLORIDE CRYS ER 10 MEQ PO TBCR
10.0000 meq | EXTENDED_RELEASE_TABLET | Freq: Every day | ORAL | Status: DC
  Administered 2020-07-07: 10 meq via ORAL
  Filled 2020-07-07: qty 1

## 2020-07-07 NOTE — Progress Notes (Signed)
[place] PHYSICAL MEDICINE & REHABILITATION PROGRESS NOTE   Subjective/Complaints: No new issues , denies pains, discussed no driving after D/C, pt is retired  ROS: Patient denies CP, SOB, N/V/D Objective:   No results found. No results for input(s): WBC, HGB, HCT, PLT in the last 72 hours. No results for input(s): NA, K, CL, CO2, GLUCOSE, BUN, CREATININE, CALCIUM in the last 72 hours.  Intake/Output Summary (Last 24 hours) at 07/07/2020 0832 Last data filed at 07/07/2020 0609 Gross per 24 hour  Intake 500 ml  Output 975 ml  Net -475 ml       Physical Exam: Vital Signs Blood pressure (!) 153/70, pulse 64, temperature 97.7 F (36.5 C), resp. rate 17, height 5\' 10"  (1.778 m), weight 129.3 kg, SpO2 94 %.  General: No acute distress Mood and affect are appropriate Heart: Regular rate and rhythm no rubs murmurs or extra sounds Lungs: Clear to auscultation, breathing unlabored, no rales or wheezes Abdomen: Positive bowel sounds, soft nontender to palpation, nondistended Extremities: No clubbing, cyanosis, or edema Skin: No evidence of breakdown, no evidence of rash  Motor 3 to 3+ RUE and RLE,  5/5 on left  Musculoskeletal: Full passiverange of motion in all 4 extremities, reduced active range of motion right upper extremity. No joint swelling  Assessment/Plan: 1. Functional deficits secondary to Left pontine infarct  which require 3+ hours per day of interdisciplinary therapy in a comprehensive inpatient rehab setting.  Physiatrist is providing close team supervision and 24 hour management of active medical problems listed below.  Physiatrist and rehab team continue to assess barriers to discharge/monitor patient progress toward functional and medical goals  Care Tool:  Bathing  Bathing activity did not occur:  (N/A) Body parts bathed by patient: Right arm, Chest, Abdomen, Front perineal area, Face, Right upper leg, Left upper leg, Left arm, Right lower leg, Left lower leg    Body parts bathed by helper: Buttocks, Right lower leg, Left lower leg     Bathing assist Assist Level: Minimal Assistance - Patient > 75%     Upper Body Dressing/Undressing Upper body dressing   What is the patient wearing?: Pull over shirt    Upper body assist Assist Level: Minimal Assistance - Patient > 75%    Lower Body Dressing/Undressing Lower body dressing      What is the patient wearing?: Incontinence brief, Pants     Lower body assist Assist for lower body dressing: Moderate Assistance - Patient 50 - 74%     Toileting Toileting    Toileting assist Assist for toileting: Maximal Assistance - Patient 25 - 49% Assistive Device Comment: Bariatric BSC   Transfers Chair/bed transfer  Transfers assist  Chair/bed transfer activity did not occur: N/A  Chair/bed transfer assist level: Minimal Assistance - Patient > 75%     Locomotion Ambulation   Ambulation assist   Ambulation activity did not occur: N/A  Assist level: 2 helpers Assistive device: Walker-rolling Max distance: 15   Walk 10 feet activity   Assist  Walk 10 feet activity did not occur: N/A  Assist level: 2 helpers Assistive device: Walker-rolling   Walk 50 feet activity   Assist Walk 50 feet with 2 turns activity did not occur: N/A         Walk 150 feet activity   Assist Walk 150 feet activity did not occur: N/A         Walk 10 feet on uneven surface  activity   Assist Walk 10 feet  on uneven surfaces activity did not occur: N/A         Wheelchair     Assist Will patient use wheelchair at discharge?: No (Per PT goals )      Wheelchair assist level: Moderate Assistance - Patient 50 - 74% Max wheelchair distance: 150    Wheelchair 50 feet with 2 turns activity    Assist        Assist Level: Moderate Assistance - Patient 50 - 74%   Wheelchair 150 feet activity     Assist      Assist Level: Moderate Assistance - Patient 50 - 74%   Blood  pressure (!) 153/70, pulse 64, temperature 97.7 F (36.5 C), resp. rate 17, height 5\' 10"  (1.778 m), weight 129.3 kg, SpO2 94 %.  Medical Problem List and Plan: 1.  Right hemiparesis secondary to left pontine infarction.  Follow-up outpatient cardiology services to discuss loop recorder             -patient may shower             -ELOS/Goals: 10/12      2.  Antithrombotics: -DVT/anticoagulation: SCDs             -antiplatelet therapy: Aspirin 325 mg daily 3. Pain Management: Tylenol as needed. Well controlled 4. Mood: Provide emotional support. In positive mood.              -antipsychotic agents: N/A 5. Neuropsych: This patient is capable of making decisions on his own behalf. 6. Skin/Wound Care: Routine skin checks 7. Fluids/Electrolytes/Nutrition: Routine in and outs.  Weekly CMP 8.  Hypertension.  Norvasc 10 mg daily, Cozaar 100 mg daily.   Remains with elevated systolic, HRs on low side so no BB, will trial HCTZ, add KCL tab given last K+ was 3.6 , recheck BMET Vitals:   07/06/20 2018 07/07/20 0603  BP: (!) 165/77 (!) 153/70  Pulse: 68 64  Resp: 19 17  Temp: (!) 97.3 F (36.3 C) 97.7 F (36.5 C)  SpO2: 99% 94%  9.  Diabetes mellitus with hyperglycemia.  Hemoglobin A1c 7.5.  SSI.          CBG (last 3)  Recent Labs    07/06/20 1627 07/06/20 2103 07/07/20 0606  GLUCAP 164* 137* 108*  controlled 10/8 10.  BPH.  Proscar.               emptying well 11.  Hyperlipidemia.  Lipitor--discussed rationale for statin after stroke and in general. He wishes to hold regardless 12.  History of gout.  Zyloprim 300 mg daily.  Monitor for any gout flareups 13.  History of non-Hodgkin's lymphoma status post chemotherapy.  Follow-up Dr Randa Evens as outpatient. 14.  Morbid obesity.  BMI 40.89.  Dietary follow-up 15.  History of rectal outlet bleeding.  Felt to be related to possible hemorrhoids.f/u  Hgb normal 16. Constipation improved with laxatives: emptying regularly. Generally  continent, but had one inc stool yesterday      LOS: 23 days A FACE TO FACE EVALUATION WAS PERFORMED  Charlett Blake 07/07/2020, 8:32 AM

## 2020-07-07 NOTE — Progress Notes (Signed)
Physical Therapy Session Note  Patient Details  Name: Peter Ryan MRN: [id number] Date of Birth: Feb 10, 1951  Today's Date: 07/07/2020 PT Individual Time: 0900-0957 PT Individual Time Calculation (min): 57 min   Short Term Goals: Week 1:  PT Short Term Goal 1 (Week 1): Pt will perform supine<>sit with max assist of 1 PT Short Term Goal 1 - Progress (Week 1): Met PT Short Term Goal 2 (Week 1): Pt will perform sit<>stands with +2 max assist using LRAD (not lift equipment) PT Short Term Goal 2 - Progress (Week 1): Met PT Short Term Goal 3 (Week 1): Pt will perform bed<>chair transfers with +2 max assist without use of lift equipment PT Short Term Goal 3 - Progress (Week 1): Met PT Short Term Goal 4 (Week 1): Pt will initiate gait training PT Short Term Goal 4 - Progress (Week 1): Met Week 2:  PT Short Term Goal 1 (Week 2): Pt will ambulate 85ft with mod assist and LRAD PT Short Term Goal 1 - Progress (Week 2): Met PT Short Term Goal 2 (Week 2): Pt will perform bed mobility with min assist PT Short Term Goal 2 - Progress (Week 2): Progressing toward goal PT Short Term Goal 3 (Week 2): Pt will transfer to and from [facility] with mod assist PT Short Term Goal 3 - Progress (Week 2): Met PT Short Term Goal 4 (Week 2): Pt will propell WC 157ft with min assist PT Short Term Goal 4 - Progress (Week 2): Met  Skilled Therapeutic Interventions/Progress Updates:    pt received in bed and agreeable to therapy. Pt denied pain at start and end of session. Pt directed in supine>sit EOB at mod A for trunk support into sitting, min A for initial sitting balance improved to CGA. Pt required max A for donning shoes in sitting for time. Pt directed in STS and stand pivot transfer to WC min A with extra time and FWW with R hand grip assist in place, VC for sequencing. Pt taken to gym in [facility] total A for time. Pt directed in ascending and descending 3 steps with R hand rails max A with R LE in slight knee flexion,  improved with VC and pt benefited from VC for sequencing and safety. Pt required prolonged rest break in sitting for energy conservation, educated by PT to use ramp at this time for pt and spouse safety, he reported agreement and understanding. Pt directed in static standing with RUE support on FWW for standing balance training with reaching forward inside and outside BOS, 2x25 reaches and duwl task training with card matching, pt required rest break between reps, min A one instance of mod A for balance and frequent VC for weight shifting, midline stance, R knee extension and R LE placement. Pt directed in gait training with FWW and RUE grip assist, for 35' min A with VC for increased step height RLE, midline stance and improved step length on R. Post rest break, additional 30' completed min A with similar cues. Pt reported fatigue and requested to return to room but agreeable to remaining in [name]. Pt left in WC, All needs in reach and in good condition. Call light in hand.    Therapy Documentation Precautions:  Precautions Precautions: Fall, Other (comment) Precaution Comments: R hemiparesis Restrictions Weight Bearing Restrictions: No Other Position/Activity Restrictions: protect weak RUE General:   Vital Signs: Therapy Vitals Temp: 97.7 F (36.5 C) Pulse Rate: 64 Resp: 17 BP: (!) 153/70 Patient Position (if appropriate): Lying Oxygen  Therapy SpO2: 94 % O2 Device: Room Air    Therapy/Group: Individual Therapy  Peter Ryan 07/07/2020, 9:59 AM

## 2020-07-07 NOTE — Progress Notes (Signed)
Physical Therapy Session Note  Patient Details  Name: Peter Ryan MRN: [id number] Date of Birth: 12/06/1950  Today's Date: 07/07/2020 PT Individual Time: 1405-1500 PT Individual Time Calculation (min): 55 min   Short Term Goals: Week 4:  PT Short Term Goal 1 (Week 4): STG=LTG due to ELOS  Skilled Therapeutic Interventions/Progress Updates:   Pt received sitting in WC and agreeable to PT. Pt transported rehab gym. Gait training with min assist from wife to navigate simulation to bathroom with RW to side step through 27 inch doorway completed x 2 with min assist overall from wife and PT. Gait training through gym 2 x 52ft with min assist from wife and PT with moderate cues for sequencing in turns. Pt requesting proglonged rest break between bouts of gait training with education to wife for safe home set up to reduce fall risk on obstacles in home. Pt returned to room and performed ambulatory transfer to bed with RW and min assist. Sit>supine completed with min assist from wife, and left supine in bed with call bell in reach and all needs met.       Therapy Documentation Precautions:  Precautions Precautions: Fall, Other (comment) Precaution Comments: R hemiparesis Restrictions Weight Bearing Restrictions: No Other Position/Activity Restrictions: protect weak RUE Vital Signs: Therapy Vitals Temp: (!) 97.5 F (36.4 C) Temp Source: Oral Pulse Rate: 75 Resp: 17 BP: 117/67 Patient Position (if appropriate): Sitting Oxygen Therapy SpO2: 98 % O2 Device: Room Air Pain: Pain Assessment Pain Scale: Faces Pain Score: 0-No pain    Therapy/Group: Individual Therapy  Lorie Phenix 07/07/2020, 3:08 PM

## 2020-07-07 NOTE — Progress Notes (Signed)
Occupational Therapy Session Note  Patient Details  Name: Peter Ryan MRN: [id number] Date of Birth: 1950-11-01  Today's Date: 07/07/2020 OT Individual Time: 1100-1202 OT Individual Time Calculation (min): 62 min    Short Term Goals: Week 3:  OT Short Term Goal 1 (Week 3): STGs=LTGs due to ELOS  Skilled Therapeutic Interventions/Progress Updates:    Pt up in wheelchair to start session.  Worked on propelling it down the hallway to start session with overall mod assist to keep it straight.  Therapist helped him down to the therapy gym after propelling approximately 30'.  Began with use of the UE ergonometer for RUE strengthening.  Therapist applied ace bandage to the RUE as he could not sustain grip for more than 2-3 revolutions.  He was able to complete one, 4 min interval using BUEs on level 1 resistance and RPMs maintained around 20.  He then completed 2 more sets of 2 mins each with isolated use of the RUE.  First set was still with resistance at level 5 and min assist to complete forward revolutions at times when he fatigued.  The next set was completed peddling in reverse with resistance reduced to level one and pt needed min to mod as he fatigued.  Transitioned from the ergonometer to the the therapy mat with mod assist stand pivot. Continued working on shoulder flexion and functional reach in the [place].  Had pt hold dowel rod with BUEs and bring it to targets incorporating shoulder flexion to knee level and also up to head level.  Mod facilitation needed to reach target higher then chest level.  Progressed to working on picking up a cup with the RUE from knee level and bringing to mouth with min assist for simulated drinking.  Also had him work on picking up a cup of water from knee level and then pouring it into a cup up on the table or vice versa with overall min assist.  Several repetitions of this were completed.  Finished session with transfer back to the wheelchair and return to the room.   Pt was left sitting up with the call button and phone in reach and safety belt in place.  Therapist encouraged pt to continue working on shoulder and hand movements on his own when not in therapy.   Therapy Documentation Precautions:  Precautions Precautions: Fall, Other (comment) Precaution Comments: R hemiparesis Restrictions Weight Bearing Restrictions: No Other Position/Activity Restrictions: protect weak RUE  Pain: Pain Assessment Pain Scale: Faces Pain Score: 0-No pain ADL: See Care Tool Section for some details of mobility and selfcare  Therapy/Group: Individual Therapy  Shadrach Bartunek OTR/L 07/07/2020, 12:50 PM

## 2020-07-07 NOTE — Plan of Care (Signed)
  Problem: RH BOWEL ELIMINATION Goal: RH STG MANAGE BOWEL WITH ASSISTANCE Description: STG Manage Bowel with min Assistance. Outcome: Progressing Goal: RH STG MANAGE BOWEL W/MEDICATION W/ASSISTANCE Description: STG Manage Bowel with Medication with min Assistance. Outcome: Progressing   Problem: RH SKIN INTEGRITY Goal: RH STG MAINTAIN SKIN INTEGRITY WITH ASSISTANCE Description: STG Maintain Skin Integrity With min Assistance. Outcome: Progressing Goal: RH STG ABLE TO PERFORM INCISION/WOUND CARE W/ASSISTANCE Description: STG Able To Perform Incision/Wound Care With supervision Assistance. Outcome: Progressing

## 2020-07-08 ENCOUNTER — Inpatient Hospital Stay (HOSPITAL_COMMUNITY): Payer: Medicare Other | Admitting: Physical Therapy

## 2020-07-08 LAB — GLUCOSE, CAPILLARY
Glucose-Capillary: 120 mg/dL — ABNORMAL HIGH (ref 70–99)
Glucose-Capillary: 133 mg/dL — ABNORMAL HIGH (ref 70–99)
Glucose-Capillary: 139 mg/dL — ABNORMAL HIGH (ref 70–99)
Glucose-Capillary: 182 mg/dL — ABNORMAL HIGH (ref 70–99)

## 2020-07-08 MED ORDER — POTASSIUM CHLORIDE CRYS ER 20 MEQ PO TBCR
40.0000 meq | EXTENDED_RELEASE_TABLET | Freq: Two times a day (BID) | ORAL | Status: AC
  Administered 2020-07-08 – 2020-07-09 (×3): 40 meq via ORAL
  Filled 2020-07-08 (×3): qty 2

## 2020-07-08 NOTE — Progress Notes (Signed)
Physical Therapy Session Note  Patient Details  Name: Peter Ryan MRN: [id number] Date of Birth: 1950/12/30  Today's Date: 07/08/2020 PT Individual Time: 0820-0945 PT Individual Time Calculation (min): 85 min   Short Term Goals: Week 4:  PT Short Term Goal 1 (Week 4): STG=LTG due to ELOS  Skilled Therapeutic Interventions/Progress Updates:   Pt received supine in bed and agreeable to PT. Supine>sit transfer with min assist and increased time with HOB fully elevated. PT assisted pt to don shoes sitting EOB with total A for time management. Stand pivot transfer to [facility] on the L with CGA and min cues only from PT for AD management and sequencing.   Pt transported to rehab gym in [facility]. Dynamic standing balance performed with foot taps on 4 inch step with BUE support on RW 2 x 5 BLE. CGA-min assist from PT with min-mod cues for attention to the RLE to prevent knee collapse. Lateral Reach R and L x 6 to obtain bean bag then toss to target. CGA with lateral reach L and min assist with reach right. Moderate cues for midline orientation to return to midline following R reach.   PT instructed pt in TUG: 167 sec (average of 3 trials; >13.5 sec indicates increased fall risk)  UE NMR: seated chest press. mid row. overhead press. Wrist/elbow supination/pronation. tricep extension. Each completed x 12 BUE with 3# bar weight with assist from PT to improve grasp on the [place]. Seated LAQ, ankle PF. Ankle PF, hip abduction. Hip flexion. Knee flexion. AROM progressing to mild manual resistance from PT x 12 BLE wth cues for sustained hold at end range and decreased speed of movement on the RLE.   Pt returned to room and performed stand pivot transfer to bed with RW and min assist and cues to remain within RW in turn. Sit>supine completed with mod assist for BLE management, and left supine in bed with call bell in reach and all needs met.          Therapy Documentation Precautions:   Precautions Precautions: Fall, Other (comment) Precaution Comments: R hemiparesis Restrictions Weight Bearing Restrictions: No Other Position/Activity Restrictions: protect weak RUE Pain: Pain Assessment Pain Scale: 0-10 Pain Score: 0-No pain   Therapy/Group: Individual Therapy  Lorie Phenix 07/08/2020, 9:46 AM

## 2020-07-08 NOTE — Progress Notes (Signed)
Physical Therapy Session Note  Patient Details  Name: Peter Ryan MRN: [id number] Date of Birth: 02-09-1951  Today's Date: 07/08/2020 PT Individual Time: 1630-1700 PT Individual Time Calculation (min): 30 min   Short Term Goals: Week 4:  PT Short Term Goal 1 (Week 4): STG=LTG due to ELOS  Skilled Therapeutic Interventions/Progress Updates:   Pt received supine in bed and agreeable to PT. Supine>sit transfer with min assist and slight elevation in HOB. Stand pivot transfer to [facility] with RW and CGA. Pt transported to orthogym. dynavision x 2 bouts with LUE and then RUE. Min Assist-CGA from PT throughout with only min cues for attention to the RLE. Patient returned to room and left sitting in [facility] with call bell in reach and all needs met.           Therapy Documentation Precautions:  Precautions Precautions: Fall, Other (comment) Precaution Comments: R hemiparesis Restrictions Weight Bearing Restrictions: No Other Position/Activity Restrictions: protect weak RUE Vital Signs: Therapy Vitals Temp: 98.2 F (36.8 C) Pulse Rate: 66 Resp: 18 BP: (!) 160/86 Patient Position (if appropriate): Lying Oxygen Therapy SpO2: 96 % O2 Device: Room Air Pain: denies      Therapy/Group: Individual Therapy  Lorie Phenix 07/08/2020, 5:41 PM

## 2020-07-08 NOTE — Progress Notes (Signed)
[place] PHYSICAL MEDICINE & REHABILITATION PROGRESS NOTE   Subjective/Complaints:  Doing pretty well- no pain- dizzy is improved.   LBM 2 night ago- denies constipated.  Sleeping OK. Eating well.   ROS:  Pt denies SOB, abd pain, CP, N/V/C/D, and vision changes  Objective:   No results found. No results for input(s): WBC, HGB, HCT, PLT in the last 72 hours. Recent Labs    07/07/20 0906  NA 141  K 3.0*  CL 102  CO2 29  GLUCOSE 164*  BUN 12  CREATININE 0.84  CALCIUM 8.9    Intake/Output Summary (Last 24 hours) at 07/08/2020 1441 Last data filed at 07/08/2020 0745 Gross per 24 hour  Intake 600 ml  Output 475 ml  Net 125 ml       Physical Exam: Vital Signs Blood pressure 127/66, pulse (!) 57, temperature 97.8 F (36.6 C), temperature source Oral, resp. rate 18, height 5\' 10"  (1.778 m), weight 129.3 kg, SpO2 97 %.  General: No acute distress; sitting up, working with therapy, NAD Mood and affect are appropriate Heart: borderline bradycardia, regular rhythm Lungs: CTA B/L- no W/R/R- good air movement Abdomen: Soft, NT, ND, (+)BS  Extremities: No clubbing, cyanosis, or edema Skin: No evidence of breakdown, no evidence of rash Motor 3 to 3+ RUE and RLE,  5/5 on left  Musculoskeletal: Full passiverange of motion in all 4 extremities, reduced active range of motion right upper extremity. No joint swelling  Assessment/Plan: 1. Functional deficits secondary to Left pontine infarct  which require 3+ hours per day of interdisciplinary therapy in a comprehensive inpatient rehab setting.  Physiatrist is providing close team supervision and 24 hour management of active medical problems listed below.  Physiatrist and rehab team continue to assess barriers to discharge/monitor patient progress toward functional and medical goals  Care Tool:  Bathing  Bathing activity did not occur:  (N/A) Body parts bathed by patient: Right arm, Chest, Abdomen, Front perineal area,  Face, Right upper leg, Left upper leg, Left arm, Right lower leg, Left lower leg   Body parts bathed by helper: Buttocks, Right lower leg, Left lower leg     Bathing assist Assist Level: Minimal Assistance - Patient > 75%     Upper Body Dressing/Undressing Upper body dressing   What is the patient wearing?: Pull over shirt    Upper body assist Assist Level: Minimal Assistance - Patient > 75%    Lower Body Dressing/Undressing Lower body dressing      What is the patient wearing?: Incontinence brief, Pants     Lower body assist Assist for lower body dressing: Moderate Assistance - Patient 50 - 74%     Toileting Toileting    Toileting assist Assist for toileting: Maximal Assistance - Patient 25 - 49% Assistive Device Comment: Bariatric BSC   Transfers Chair/bed transfer  Transfers assist  Chair/bed transfer activity did not occur: N/A  Chair/bed transfer assist level: Contact Guard/Touching assist     Locomotion Ambulation   Ambulation assist   Ambulation activity did not occur: N/A  Assist level: Minimal Assistance - Patient > 75% Assistive device: Walker-rolling Max distance: 50   Walk 10 feet activity   Assist  Walk 10 feet activity did not occur: N/A  Assist level: Minimal Assistance - Patient > 75% Assistive device: Walker-rolling   Walk 50 feet activity   Assist Walk 50 feet with 2 turns activity did not occur: N/A  Assist level: Minimal Assistance - Patient > 75%  Walk 150 feet activity   Assist Walk 150 feet activity did not occur: N/A         Walk 10 feet on uneven surface  activity   Assist Walk 10 feet on uneven surfaces activity did not occur: N/A         Wheelchair     Assist Will patient use wheelchair at discharge?: No (Per PT goals )      Wheelchair assist level: Minimal Assistance - Patient > 75% Max wheelchair distance: 50    Wheelchair 50 feet with 2 turns activity    Assist         Assist Level: Minimal Assistance - Patient > 75%   Wheelchair 150 feet activity     Assist      Assist Level: Moderate Assistance - Patient 50 - 74%   Blood pressure 127/66, pulse (!) 57, temperature 97.8 F (36.6 C), temperature source Oral, resp. rate 18, height 5\' 10"  (1.778 m), weight 129.3 kg, SpO2 97 %.  Medical Problem List and Plan: 1.  Right hemiparesis secondary to left pontine infarction.  Follow-up outpatient cardiology services to discuss loop recorder             -patient may shower             -ELOS/Goals: 10/12      2.  Antithrombotics: -DVT/anticoagulation: SCDs             -antiplatelet therapy: Aspirin 325 mg daily 3. Pain Management: Tylenol as needed. Well controlled 4. Mood: Provide emotional support. In positive mood.              -antipsychotic agents: N/A 5. Neuropsych: This patient is capable of making decisions on his own behalf. 6. Skin/Wound Care: Routine skin checks 7. Fluids/Electrolytes/Nutrition: Routine in and outs.  Weekly CMP 8.  Hypertension.  Norvasc 10 mg daily, Cozaar 100 mg daily.   Remains with elevated systolic, HRs on low side so no BB, will trial HCTZ, add KCL tab given last K+ was 3.6 , recheck BMET Vitals:   07/07/20 1944 07/08/20 0524  BP: (!) 147/63 127/66  Pulse: 66 (!) 57  Resp: 16 18  Temp: 97.8 F (36.6 C) 97.8 F (36.6 C)  SpO2: 100% 97%  9.  Diabetes mellitus with hyperglycemia.  Hemoglobin A1c 7.5.  SSI.          CBG (last 3)  Recent Labs    07/07/20 2114 07/08/20 0605 07/08/20 1127  GLUCAP 118* 120* 133*  10/9- well controlled- con't meds 10.  BPH.  Proscar.               emptying well 11.  Hyperlipidemia.  Lipitor--discussed rationale for statin after stroke and in general. He wishes to hold regardless 12.  History of gout.  Zyloprim 300 mg daily.  Monitor for any gout flareups 13.  History of non-Hodgkin's lymphoma status post chemotherapy.  Follow-up Dr Randa Evens as outpatient. 14.  Morbid  obesity.  BMI 40.89.  Dietary follow-up 15.  History of rectal outlet bleeding.  Felt to be related to possible hemorrhoids.f/u  Hgb normal 16. Constipation improved with laxatives: emptying regularly. Generally continent, but had one inc stool yesterday  17. Hypokalemia  9/10- K+ 3.0- will replete KCl 40 mEq x3 doses     LOS: 24 days A FACE TO FACE EVALUATION WAS PERFORMED  Peter Ryan 07/08/2020, 2:41 PM

## 2020-07-08 NOTE — Plan of Care (Signed)
  Problem: RH Bed Mobility Goal: LTG Patient will perform bed mobility with assist (PT) Description: LTG: Patient will perform bed mobility with assistance, with/without cues (PT). Outcome: Not Applicable Flowsheets (Taken 07/08/2020 [phone]) LTG: Pt will perform bed mobility with assistance level of: (goal d/c with plans to sleep in recliner) -- Note: goal d/c with plans to sleep in recliner

## 2020-07-09 ENCOUNTER — Inpatient Hospital Stay (HOSPITAL_COMMUNITY): Payer: Medicare Other

## 2020-07-09 LAB — GLUCOSE, CAPILLARY
Glucose-Capillary: 123 mg/dL — ABNORMAL HIGH (ref 70–99)
Glucose-Capillary: 158 mg/dL — ABNORMAL HIGH (ref 70–99)
Glucose-Capillary: 102 mg/dL — ABNORMAL HIGH (ref 70–99)
Glucose-Capillary: 146 mg/dL — ABNORMAL HIGH (ref 70–99)

## 2020-07-09 NOTE — Discharge Summary (Signed)
Physician Discharge Summary  Patient ID: Peter Ryan MRN: [id number] DOB/AGE: 04-11-1951 69 y.o.  Admit date: 06/14/2020 Discharge date: 07/11/2020  Discharge Diagnoses:  Principal Problem:   Left pontine cerebrovascular accident [facility]) DVT prophylaxis Hypertension Diabetes mellitus Hyperlipidemia Gout History of non-Hodgkin's lymphoma Morbid obesity History of rectal outlet bleeding  Discharged Condition: Stable Significant Diagnostic Studies: DG Abd 1 View  Result Date: 06/19/2020 CLINICAL DATA:  Constipation EXAM: ABDOMEN - 1 VIEW COMPARISON:  CT abdomen and pelvis January 03, 2020. FINDINGS: There is diffuse stool throughout colon. There is no bowel dilatation or air-fluid level to suggest bowel obstruction. No free air. There are foci of arterial vascular calcification in the left pelvis. IMPRESSION: Diffuse stool throughout colon consistent with constipation. No bowel obstruction or free air demonstrable. Electronically Signed   By: Lowella Grip III M.D.   On: 06/19/2020 13:04    Labs:  Basic Metabolic Panel: Recent Labs  Lab 07/07/20 0906 07/10/20 0639  NA 141 139  K 3.0* 4.3  CL 102 107  CO2 29 20*  GLUCOSE 164* 124*  BUN 12 16  CREATININE 0.84 0.95  CALCIUM 8.9 8.5*    CBC: No results for input(s): WBC, NEUTROABS, HGB, HCT, MCV, PLT in the last 168 hours.  CBG: Recent Labs  Lab 07/09/20 2235 07/10/20 0603 07/10/20 1150 07/10/20 1641 07/10/20 2128  GLUCAP 146* 123* 132* 88 135*   Family history.  Mother with uterine cancer diabetes as well as cancer.  Denies any bladder cancer or kidney disease or prostate cancer  Brief HPI:   Peter Ryan is a 69 y.o. right-handed male with history of obesity, BPH, quit smoking 46 years ago hypertension, diabetes mellitus, history of rectal bleeding, hyperlipidemia, non-Hodgkin's lymphoma with chemotherapy now in remission.  Patient lives with spouse 1 level home 4 steps to entry.  Independent prior to  admission with assistive device.  Presented 06/05/2020 with right side weakness and dysarthria.  Cranial CT scan unremarkable.  Patient did not receive TPA.  CT angiogram of head and neck with no large vessel occlusion.  MRI of the brain showed moderate sized left pontine infarction no associated hemorrhage or mass-effect.  Stable to perhaps slightly faded acute white matter lacunar infarct adjacent to the right frontal lobe.  Admission chemistries potassium 3.4 glucose 252**virus negative.  Echocardiogram with ejection fraction of 14% grade 1 diastolic dysfunction.  TEE with no acute findings and did not reveal any PFO.  Planning loop recorder discussion as outpatient with cardiology services.  Currently maintained on aspirin for CVA prophylaxis.  No DAPT at this time due to history of rectal bleeding.  Therapy evaluations completed and patient was admitted for a comprehensive rehab program   Hospital Course: Peter Ryan was admitted to rehab 06/14/2020 for inpatient therapies to consist of PT, ST and OT at least three hours five days a week. Past admission physiatrist, therapy team and rehab RN have worked together to provide customized collaborative inpatient rehab.  Pertaining to patient's left pontine infarction maintained on aspirin.  No DAPT due to history of rectal bleeding.  SCDs for DVT prophylaxis.  Patient would follow-up neurology services.  Blood pressures controlled with Norvasc, Cozaar 100 mg daily as well as clonidine.  Patient would need follow-up primary MD.  He did have a history of gout remains on Zyloprim no gout flareups.  Lipitor ongoing for hyperlipidemia.  Blood sugars controlled with hemoglobin A1c 7.5 and he continued on Amaryl with full diabetic teaching completed.  History of non-Hodgkin's lymphoma  status post chemotherapy follow-up outpatient Dr.Rao.   Blood pressures were monitored on TID basis and controlled  Diabetes has been monitored with ac/hs CBG checks and SSI was use  prn for tighter BS control.    Rehab course: During patient's stay in rehab weekly team conferences were held to monitor patient's progress, set goals and discuss barriers to discharge. At admission, patient required max assist sit to stand moderate assist rolling max assist lower body ADLs min mod assist upper body ADLs  Physical exam.  Blood pressure 137/67 pulse 66 temperature 98.1 respirations 18 oxygen saturation 97% room air Constitutional.  No acute distress HEENT Head.  Normocephalic and atraumatic Eyes.  Pupils round and reactive to light no discharge.nystagmus Neck.  Supple nontender no JVD without thyromegaly Cardiac regular rate and rhythm without any extra sounds or murmur heard Abdomen.  Soft nontender positive bowel sounds without rebound Respiratory effort normal no respiratory distress without wheeze Musculoskeletal normal range of motion Skin.  Warm and dry Neurological.  Makes eye contact with examiner alert dysarthric but intelligible Motor.  Left upper left lower extremity 5/5 proximal distal Right upper extremity shoulder abduction 2/5 distally 3/5 Right lower extremity 2/5 proximal distal Sensation intact  He/  has had improvement in activity tolerance, balance, postural control as well as ability to compensate for deficits. He/ has had improvement in functional use RUE/LUE  and RLE/LLE as well as improvement in awareness.  Supine to sit transfers minimal assist stand pivot transfers to wheelchair rolling walker contact-guard assist.  Min assist to contact-guard assist throughout sessions for attention to right lower extremity.  Patient assisted to don shoe sitting edge of bed with total assist.  Worked on propelling wheelchair in hallway to start sessions with overall moderate assist.  Full family teaching completed plan discharge to home       Disposition: Discharged to home    Diet: Diabetic diet  Special Instructions: No driving smoking or  alcohol  Medications at discharge 1.  Tylenol as needed 2.  Zyloprim 300 mg p.o. daily 3.  Norvasc 10 mg p.o. daily 4.  Aspirin 325 mg p.o. daily 5.  Lipitor 80 mg p.o. daily 6.  Vitamin D 1000 units p.o. daily 7.  Clonidine 0.1 mg p.o. twice daily 8.  Proscar 5 mg p.o. daily 9.  Amaryl 2 mg p.o. daily 10.  Cozaar 100 mg p.o. daily 11.  MiraLAX daily hold for loose stools 12.  Vitamin B12 1000 mcg p.o. daily  30-35 minutes were spent completing discharge summary and discharge planning  Discharge Instructions    Ambulatory referral to Neurology   Complete by: As directed    An appointment is requested in approximately 4 weeks left pontine infarction   Ambulatory referral to Physical Medicine Rehab   Complete by: As directed    Moderate complexity follow-up 1 to 2 weeks left pontine infarction       Follow-up Information    Kirsteins, Luanna Salk, MD Follow up.   Specialty: Physical Medicine and Rehabilitation Why: Office to call for appointment Contact information: [place] [address] [phone]        Sindy Guadeloupe, MD Follow up.   Specialty: Oncology Why: Call for appointment Contact information: [place] [address] [phone]        Minna Merritts, MD Follow up.   Specialty: Cardiology Why: Call for appointment to discuss loop recorder Contact information: [place]  [place] [address] [phone]               Signed: Lavon Paganini Ardelle Haliburton 07/11/2020, 5:09 AM

## 2020-07-09 NOTE — Progress Notes (Signed)
Occupational Therapy Session Note  Patient Details  Name: Peter Ryan MRN: [id number] Date of Birth: [birth year]  Today's Date: 07/09/2020 OT Individual Time: 1300-1340 OT Individual Time Calculation (min): 40 min    Short Term Goals: Week 3:  OT Short Term Goal 1 (Week 3): STGs=LTGs due to ELOS  Skilled Therapeutic Interventions/Progress Updates:    Pt received sitting in w/c with no c/o pain, wife present for session. Pt was taken via w/c to the therapy gym. He completed stand step transfer to the mat with min A, min cueing for proper management of R orthosis on RW. Pt transitioned into supine on mat with min A. Pt was guided through RUE NMR exercises, including closed chain BUE coordination activity, isolated shoulder flexion, scap retraction/protraction, lat pull downs, and tricep extensions. All were graded with min facilitation ranging to mod resistance band. Pt then transitioned into sidelying on the L for elbow flex/ext with theraband resistance. Throughout session discussed NMR and CVA recovery with pt and wife, including carryover to home. Discussed use of a lift chair at home and encouraged reduction of dependence on chair and to instead maximize independence with sit <> stands. Pt required max A to return to sitting from sidelying. Pt was returned to his w/c and then to his room.   Therapy Documentation Precautions:  Precautions Precautions: Fall, Other (comment) Precaution Comments: R hemiparesis Restrictions Weight Bearing Restrictions: No Other Position/Activity Restrictions: protect weak RUE Therapy/Group: Individual Therapy  Curtis Sites 07/09/2020, 6:49 AM

## 2020-07-09 NOTE — Progress Notes (Signed)
[facility] PHYSICAL MEDICINE & REHABILITATION PROGRESS NOTE   Subjective/Complaints:  Feels good- PT this afternoon- no issues  ROS:   Pt denies SOB, abd pain, CP, N/V/C/D, and vision changes   Objective:   No results found. No results for input(s): WBC, HGB, HCT, PLT in the last 72 hours. Recent Labs    07/07/20 0906  NA 141  K 3.0*  CL 102  CO2 29  GLUCOSE 164*  BUN 12  CREATININE 0.84  CALCIUM 8.9    Intake/Output Summary (Last 24 hours) at 07/09/2020 1450 Last data filed at 07/09/2020 0730 Gross per 24 hour  Intake 502 ml  Output 350 ml  Net 152 ml       Physical Exam: Vital Signs Blood pressure 134/72, pulse (!) 58, temperature 97.6 F (36.4 C), resp. rate 17, height 5\' 10"  (1.778 m), weight 129.3 kg, SpO2 96 %.  General: sitting up in bed- appropriate, NAD Mood and affect are appropriate Heart: borderline bradycardia- regular rhythm Lungs: CTA B/L- no W/R/R- good air movement Abdomen: Soft, NT, ND, (+)BS   Extremities: No clubbing, cyanosis, or edema Skin: No evidence of breakdown, no evidence of rash Motor 3 to 3+ RUE and RLE,  5/5 on left  Musculoskeletal: Full passiverange of motion in all 4 extremities, reduced active range of motion right upper extremity. No joint swelling  Assessment/Plan: 1. Functional deficits secondary to Left pontine infarct  which require 3+ hours per day of interdisciplinary therapy in a comprehensive inpatient rehab setting.  Physiatrist is providing close team supervision and 24 hour management of active medical problems listed below.  Physiatrist and rehab team continue to assess barriers to discharge/monitor patient progress toward functional and medical goals  Care Tool:  Bathing  Bathing activity did not occur:  (N/A) Body parts bathed by patient: Right arm, Chest, Abdomen, Front perineal area, Face, Right upper leg, Left upper leg, Left arm, Right lower leg, Left lower leg   Body parts bathed by helper:  Buttocks, Right lower leg, Left lower leg     Bathing assist Assist Level: Minimal Assistance - Patient > 75%     Upper Body Dressing/Undressing Upper body dressing   What is the patient wearing?: Pull over shirt    Upper body assist Assist Level: Minimal Assistance - Patient > 75%    Lower Body Dressing/Undressing Lower body dressing      What is the patient wearing?: Incontinence brief, Pants     Lower body assist Assist for lower body dressing: Moderate Assistance - Patient 50 - 74%     Toileting Toileting    Toileting assist Assist for toileting: Maximal Assistance - Patient 25 - 49% Assistive Device Comment: Bariatric BSC   Transfers Chair/bed transfer  Transfers assist  Chair/bed transfer activity did not occur: N/A  Chair/bed transfer assist level: Contact Guard/Touching assist     Locomotion Ambulation   Ambulation assist   Ambulation activity did not occur: N/A  Assist level: Minimal Assistance - Patient > 75% Assistive device: Walker-rolling Max distance: 50   Walk 10 feet activity   Assist  Walk 10 feet activity did not occur: N/A  Assist level: Minimal Assistance - Patient > 75% Assistive device: Walker-rolling   Walk 50 feet activity   Assist Walk 50 feet with 2 turns activity did not occur: N/A  Assist level: Minimal Assistance - Patient > 75%      Walk 150 feet activity   Assist Walk 150 feet activity did not occur: N/A  Walk 10 feet on uneven surface  activity   Assist Walk 10 feet on uneven surfaces activity did not occur: N/A         Wheelchair     Assist Will patient use wheelchair at discharge?: No (Per PT goals )      Wheelchair assist level: Minimal Assistance - Patient > 75% Max wheelchair distance: 50    Wheelchair 50 feet with 2 turns activity    Assist        Assist Level: Minimal Assistance - Patient > 75%   Wheelchair 150 feet activity     Assist      Assist  Level: Moderate Assistance - Patient 50 - 74%   Blood pressure 134/72, pulse (!) 58, temperature 97.6 F (36.4 C), resp. rate 17, height 5\' 10"  (1.778 m), weight 129.3 kg, SpO2 96 %.  Medical Problem List and Plan: 1.  Right hemiparesis secondary to left pontine infarction.  Follow-up outpatient cardiology services to discuss loop recorder             -patient may shower             -ELOS/Goals: 10/12      2.  Antithrombotics: -DVT/anticoagulation: SCDs             -antiplatelet therapy: Aspirin 325 mg daily 3. Pain Management: Tylenol as needed. Well controlled 4. Mood: Provide emotional support. In positive mood.              -antipsychotic agents: N/A 5. Neuropsych: This patient is capable of making decisions on his own behalf. 6. Skin/Wound Care: Routine skin checks 7. Fluids/Electrolytes/Nutrition: Routine in and outs.  Weekly CMP 8.  Hypertension.  Norvasc 10 mg daily, Cozaar 100 mg daily.   Remains with elevated systolic, HRs on low side so no BB, will trial HCTZ, add KCL tab given last K+ was 3.6 , recheck BMET  10/10- BP 130s-140s/60s-70s- better- con't regimen Vitals:   07/08/20 1935 07/09/20 0533  BP: (!) 148/72 134/72  Pulse: 64 (!) 58  Resp: 17 17  Temp: 97.6 F (36.4 C) 97.6 F (36.4 C)  SpO2: 94% 96%  9.  Diabetes mellitus with hyperglycemia.  Hemoglobin A1c 7.5.  SSI.          CBG (last 3)  Recent Labs    07/08/20 2056 07/09/20 0601 07/09/20 1112  GLUCAP 182* 123* 158*  10/10- BGs somewhat elevated, but was good yesterday- monitor another 24 hours for trends 10.  BPH.  Proscar.               emptying well 11.  Hyperlipidemia.  Lipitor--discussed rationale for statin after stroke and in general. He wishes to hold regardless 12.  History of gout.  Zyloprim 300 mg daily.  Monitor for any gout flareups 13.  History of non-Hodgkin's lymphoma status post chemotherapy.  Follow-up Dr Randa Evens as outpatient. 14.  Morbid obesity.  BMI 40.89.  Dietary  follow-up 15.  History of rectal outlet bleeding.  Felt to be related to possible hemorrhoids.f/u  Hgb normal 16. Constipation improved with laxatives: emptying regularly. Generally continent, but had one inc stool yesterday  17. Hypokalemia  10/9- K+ 3.0- will replete KCl 40 mEq x3 doses  10/10- labs in AM     LOS: 25 days A FACE TO FACE EVALUATION WAS PERFORMED  Peter Ryan 07/09/2020, 2:50 PM

## 2020-07-10 ENCOUNTER — Inpatient Hospital Stay (HOSPITAL_COMMUNITY): Payer: Medicare Other

## 2020-07-10 ENCOUNTER — Inpatient Hospital Stay (HOSPITAL_COMMUNITY): Payer: Medicare Other | Admitting: Occupational Therapy

## 2020-07-10 ENCOUNTER — Inpatient Hospital Stay (HOSPITAL_COMMUNITY): Payer: Medicare Other | Admitting: Physical Therapy

## 2020-07-10 LAB — BASIC METABOLIC PANEL
Anion gap: 12 (ref 5–15)
BUN: 16 mg/dL (ref 8–23)
CO2: 20 mmol/L — ABNORMAL LOW (ref 22–32)
Calcium: 8.5 mg/dL — ABNORMAL LOW (ref 8.9–10.3)
Chloride: 107 mmol/L (ref 98–111)
Creatinine, Ser: 0.95 mg/dL (ref 0.61–1.24)
GFR, Estimated: >60 mL/min (ref >60)
Glucose, Bld: 124 mg/dL — ABNORMAL HIGH (ref 70–99)
Potassium: 4.3 mmol/L (ref 3.5–5.1)
Sodium: 139 mmol/L (ref 135–145)

## 2020-07-10 LAB — GLUCOSE, CAPILLARY
Glucose-Capillary: 123 mg/dL — ABNORMAL HIGH (ref 70–99)
Glucose-Capillary: 132 mg/dL — ABNORMAL HIGH (ref 70–99)
Glucose-Capillary: 88 mg/dL (ref 70–99)
Glucose-Capillary: 135 mg/dL — ABNORMAL HIGH (ref 70–99)

## 2020-07-10 MED ORDER — VITAMIN B-12 1000 MCG PO TABS: 1000.0000 ug | ORAL_TABLET | Freq: Every day | ORAL | 0 refills | Status: DC

## 2020-07-10 MED ORDER — GLIMEPIRIDE 2 MG PO TABS: 2.0000 mg | ORAL_TABLET | Freq: Every day | ORAL | 0 refills | Status: DC

## 2020-07-10 MED ORDER — FINASTERIDE 5 MG PO TABS: 5.0000 mg | ORAL_TABLET | Freq: Every day | ORAL | 0 refills | Status: DC

## 2020-07-10 MED ORDER — ACETAMINOPHEN 325 MG PO TABS: 650.0000 mg | ORAL_TABLET | ORAL | Status: DC | PRN

## 2020-07-10 MED ORDER — ATORVASTATIN CALCIUM 80 MG PO TABS: 80.0000 mg | ORAL_TABLET | Freq: Every day | ORAL | 0 refills | Status: DC

## 2020-07-10 MED ORDER — POLYETHYLENE GLYCOL 3350 17 G PO PACK: 17.0000 g | PACK | Freq: Every day | ORAL | 0 refills | Status: DC

## 2020-07-10 MED ORDER — VITAMIN D3 25 MCG (1000 UT) PO CAPS: 1000.0000 [IU] | ORAL_CAPSULE | Freq: Every day | ORAL | 0 refills | Status: DC

## 2020-07-10 MED ORDER — AMLODIPINE BESYLATE 10 MG PO TABS: 10.0000 mg | ORAL_TABLET | Freq: Every day | ORAL | 5 refills | Status: DC

## 2020-07-10 MED ORDER — CLONIDINE HCL 0.1 MG PO TABS: 0.1000 mg | ORAL_TABLET | Freq: Two times a day (BID) | ORAL | 11 refills | Status: DC

## 2020-07-10 MED ORDER — ALLOPURINOL 300 MG PO TABS: 300.0000 mg | ORAL_TABLET | Freq: Every day | ORAL | 0 refills | Status: DC

## 2020-07-10 MED ORDER — LOSARTAN POTASSIUM 100 MG PO TABS: ORAL_TABLET | ORAL | 1 refills | Status: DC

## 2020-07-10 NOTE — Progress Notes (Signed)
Physical Therapy Discharge Summary  Patient Details  Name: Peter Ryan MRN: [id number] Date of Birth: [birth year]  Today's Date: 07/10/2020      Patient has met 7 of 8 long term goals due to improved activity tolerance, improved balance, improved postural control, increased range of motion, improved attention, improved awareness and improved coordination.  Patient to discharge at an ambulatory level Boyd.   Patient's care partner is independent to provide the necessary physical assistance at discharge.  Reasons goals not met: Pt was not able to consistently ambulate 76ft with minA due to decreased attention and fatigue.   Recommendation:  Patient will benefit from ongoing skilled PT services in home health setting to continue to advance safe functional mobility, address ongoing impairments in balance, transfers, gait, safety, and minimize fall risk.  Equipment: WC, RW  Reasons for discharge: treatment goals met and discharge from hospital  Patient/family agrees with progress made and goals achieved: Yes  PT Discharge Precautions/Restrictions   Vital Signs Therapy Vitals Temp: 97.7 F (36.5 C) Pulse Rate: 64 Resp: 18 BP: (!) 159/68 Patient Position (if appropriate): Sitting Oxygen Therapy SpO2: 98 % Pain Pain Assessment Pain Scale: 0-10 Pain Score: 0-No pain Vision/Perception  Vision - Assessment Eye Alignment: Within Functional Limits Ocular Range of Motion: Within Functional Limits Alignment/Gaze Preference: Within Defined Limits Tracking/Visual Pursuits: Able to track stimulus in all quads without difficulty Perception Perception: Within Functional Limits Praxis Praxis: Intact  Cognition Overall Cognitive Status: History of cognitive impairments - at baseline Arousal/Alertness: Awake/alert Orientation Level: Oriented X4 Sensation Sensation Light Touch: Impaired Detail Peripheral sensation comments: reports hx of peripheral neuropathy with stocking  pattern impaired/absent Light Touch Impaired Details: Impaired RLE;Impaired LLE Hot/Cold: Not tested Proprioception: Impaired Detail Proprioception Impaired Details: Impaired RLE Stereognosis: Not tested Coordination Gross Motor Movements are Fluid and Coordinated: No (Improved since evaluation.) Fine Motor Movements are Fluid and Coordinated: No Coordination and Movement Description: impaired due to R hemiparesis and impaired trunk control Finger Nose Finger Test: Dysmetria in RUE (undershooting) Motor  Motor Motor: Hemiplegia;Abnormal postural alignment and control;Abnormal tone Motor - Skilled Clinical Observations: R hemiparesis (UE>LE) Motor - Discharge Observations: improved from Eval continues UE>LE  Mobility Bed Mobility Bed Mobility: Rolling Right;Rolling Left;Sit to Supine;Supine to Sit Rolling Right: Minimal Assistance - Patient > 75% Rolling Left: Minimal Assistance - Patient > 75% Right Sidelying to Sit: Minimal Assistance - Patient > 75% Supine to Sit: Minimal Assistance - Patient > 75% Sitting - Scoot to Edge of Bed: Supervision/Verbal cueing Sit to Supine: Minimal Assistance - Patient > 75% Transfers Transfers: Sit to Stand;Stand to Sit Sit to Stand: Contact Guard/Touching assist Stand to Sit: Contact Guard/Touching assist Transfer (Assistive device): Rolling walker Locomotion  Gait Ambulation: Yes Gait Assistance: Minimal Assistance - Patient > 75% Gait Distance (Feet): 30 Feet Assistive device: Rolling walker Gait Assistance Details: Verbal cues for technique;Verbal cues for sequencing Gait Gait: Yes Gait Pattern: Impaired Gait Pattern: Decreased step length - right;Step-to pattern;Decreased weight shift to left;Decreased dorsiflexion - right;Right flexed knee in stance Stairs / Additional Locomotion Stairs: Yes Stairs Assistance: Minimal Assistance - Patient > 75% Stair Management Technique: Two rails Number of Stairs: 4 Height of Stairs: 6 Wheelchair  Mobility Wheelchair Mobility: Yes Wheelchair Assistance: Minimal assistance - Patient >75% Wheelchair Propulsion: Left upper extremity;Left lower extremity Wheelchair Parts Management: Needs assistance Distance: up to 159ft with rest breaks  Trunk/Postural Assessment  Cervical Assessment Cervical Assessment: Exceptions to [facility] (forward head) Thoracic Assessment Thoracic Assessment: Exceptions to [facility] (rounded shoulders)  Lumbar Assessment Lumbar Assessment: Exceptions to [facility] (posterior pelvic tilt) Postural Control Postural Control: Deficits on evaluation Righting Reactions: slighly delayed Postural Limitations: decreased  Balance Balance Balance Assessed: Yes Static Sitting Balance Static Sitting - Balance Support: Feet supported Static Sitting - Level of Assistance: 5: Stand by assistance Dynamic Sitting Balance Dynamic Sitting - Balance Support: Feet supported Dynamic Sitting - Level of Assistance: 5: Stand by assistance Dynamic Sitting - Balance Activities: [employer];Reaching for objects Static Standing Balance Static Standing - Balance Support: Bilateral upper extremity supported Static Standing - Level of Assistance: 4: Min assist;5: Stand by assistance Dynamic Standing Balance Dynamic Standing - Balance Support: Bilateral upper extremity supported Dynamic Standing - Level of Assistance: 4: Min assist Extremity Assessment  RUE Assessment RUE Assessment: Exceptions to [facility] Passive Range of Motion (PROM) Comments: WFL Active Range of Motion (AROM) Comments: Patient with AROM WFL at shoulder (>90 degrees to protect shoulder joint), wrist, and digits (composite flexion/extension). Isolated digit flxion/extension with difficulty. General Strength Comments: 3+/5 grossly LUE Assessment LUE Assessment: Within Functional Limits Passive Range of Motion (PROM) Comments: WFL Active Range of Motion (AROM) Comments: WFL General Strength Comments: MMT WFL RLE Assessment RLE  Assessment: Exceptions to [facility] RLE Strength Right Hip Flexion: 3+/5 Right Knee Flexion: 3+/5 Right Knee Extension: 4/5 Right Ankle Dorsiflexion: 3/5 Right Ankle Plantar Flexion: 3/5 LLE Assessment LLE Assessment: Within Functional Limits Active Range of Motion (AROM) Comments: WFL General Strength Comments: grossly 4+/5    Rosita DeChalus 07/10/2020, 2:10 PM

## 2020-07-10 NOTE — Plan of Care (Signed)
  Problem: RH Balance Goal: LTG Patient will maintain dynamic standing with ADLs (OT) Description: LTG:  Patient will maintain dynamic standing balance with assist during activities of daily living (OT)  Outcome: Not Met (Patient continues to require Min guard to maintain dynamic standing balance.)

## 2020-07-10 NOTE — Progress Notes (Signed)
Occupational Therapy Discharge Summary  Patient Details  Name: Peter Ryan MRN: [id number] Date of Birth: [birth year]  Today's Date: 07/10/2020 OT Individual Time: 1300-1340 OT Individual Time Calculation (min): 40 min    Patient has met 11 of 12 long term goals due to improved activity tolerance, improved balance, postural control, ability to compensate for deficits, functional use of  RIGHT upper extremity, improved attention, improved awareness and improved coordination.  Patient to discharge at [facility] Assist level. Patient's care partner is independent to provide the necessary physical assistance at discharge. Wife present for family education throughout ELOS.   Reasons goals not met: Patient continues to require Min guard to maintain dynamic standing balance with occasional cueing for posture and sequencing.   Recommendation:  Patient will benefit from ongoing skilled OT services in home health setting to continue to advance functional skills in the area of BADL and Reduce care partner burden.  Equipment: Wide DABSC  Reasons for discharge: treatment goals met and discharge from hospital  Patient/family agrees with progress made and goals achieved: Yes   Skilled Therapeutic Interventions/Progress Updates:  Patient met lying supine in bed in agreement with OT treatment session. 0/10 pain at rest or with activity. Patient completed stand-pivot transfer to wc on L with Min A. OT provided education on HEP. Patient and wife expressed verbal understanding. Patient/wife with last minute questions about BADLs upon d/c home. All questions answered to patient/family satisfaction. Session concluded with patient seated in wc with call bell within reach and all needs met. Wife present at bedside.   OT Discharge Precautions/Restrictions  Precautions Precautions: Fall;Other (comment) Precaution Comments: R hemiparesis Restrictions Weight Bearing Restrictions: No Other  Position/Activity Restrictions: protect weak RUE Vital Signs Therapy Vitals Temp: 97.7 F (36.5 C) Pulse Rate: 64 Resp: 18 BP: (!) 159/68 Patient Position (if appropriate): Sitting Oxygen Therapy SpO2: 98 % Pain Pain Assessment Pain Scale: 0-10 Pain Score: 0-No pain ADL ADL Eating: Independent Where Assessed-Eating: Chair Grooming: Independent Where Assessed-Grooming: Sitting at sink Upper Body Bathing: Supervision/safety Where Assessed-Upper Body Bathing: Shower Lower Body Bathing: Minimal assistance Where Assessed-Lower Body Bathing: Shower Upper Body Dressing: Setup Where Assessed-Upper Body Dressing: Edge of bed Lower Body Dressing: Minimal assistance Where Assessed-Lower Body Dressing: Edge of bed, Other (Comment) (With AE) Toileting: Minimal assistance Where Assessed-Toileting: Bedside Commode (bariatric DABSC) Toilet Transfer: Contact guard (Intermittent Min A due to fatigue.) Toilet Transfer Method: Stand pivot Science writer: Drop arm bedside commode, Other (comment) (Bariatric DABSC) Walk-In Shower Transfer: Curator Method: Radiographer, therapeutic: Other (comment) (Bariatric DABSC) Vision Baseline Vision/History: Wears glasses Wears Glasses: At all times Patient Visual Report: No change from baseline Vision Assessment?: Yes Eye Alignment: Within Functional Limits Ocular Range of Motion: Within Functional Limits Alignment/Gaze Preference: Within Defined Limits Tracking/Visual Pursuits: Able to track stimulus in all quads without difficulty Perception  Perception: Within Functional Limits Praxis Praxis: Intact Cognition Overall Cognitive Status: History of cognitive impairments - at baseline Arousal/Alertness: Awake/alert Orientation Level: Oriented X4 Sensation Sensation Light Touch: Impaired Detail Peripheral sensation comments: reports hx of peripheral neuropathy with stocking pattern  impaired/absent Light Touch Impaired Details: Impaired RLE;Impaired LLE Hot/Cold: Not tested Proprioception: Impaired Detail Proprioception Impaired Details: Impaired RLE Stereognosis: Not tested Coordination Gross Motor Movements are Fluid and Coordinated: No (Improved since evaluation.) Fine Motor Movements are Fluid and Coordinated: No Coordination and Movement Description: impaired due to R hemiparesis and impaired trunk control Finger Nose Finger Test: Dysmetria in RUE (undershooting) Motor  Motor  Motor: Hemiplegia;Abnormal postural alignment and control;Abnormal tone Motor - Skilled Clinical Observations: R hemiparesis (UE>LE) Motor - Discharge Observations: improved from Eval continues UE>LE Mobility  Bed Mobility Bed Mobility: Rolling Right;Rolling Left;Sit to Supine;Supine to Sit Rolling Right: Minimal Assistance - Patient > 75% Rolling Left: Minimal Assistance - Patient > 75% Right Sidelying to Sit: Minimal Assistance - Patient > 75% Supine to Sit: Minimal Assistance - Patient > 75% Sitting - Scoot to Edge of Bed: Supervision/Verbal cueing Sit to Supine: Minimal Assistance - Patient > 75% Transfers Sit to Stand: Contact Guard/Touching assist Stand to Sit: Contact Guard/Touching assist  Trunk/Postural Assessment  Cervical Assessment Cervical Assessment: Exceptions to [facility] (forward head) Thoracic Assessment Thoracic Assessment: Exceptions to [facility] (rounded shoulders) Lumbar Assessment Lumbar Assessment: Exceptions to [facility] (posterior pelvic tilt) Postural Control Postural Control: Deficits on evaluation Righting Reactions: slighly delayed Postural Limitations: decreased  Balance Balance Balance Assessed: Yes Static Sitting Balance Static Sitting - Balance Support: Feet supported Static Sitting - Level of Assistance: 5: Stand by assistance Dynamic Sitting Balance Dynamic Sitting - Balance Support: Feet supported Dynamic Sitting - Level of Assistance: 5: Stand by  assistance Dynamic Sitting - Balance Activities: [employer];Reaching for objects Static Standing Balance Static Standing - Balance Support: Bilateral upper extremity supported Static Standing - Level of Assistance: 4: Min assist;5: Stand by assistance Dynamic Standing Balance Dynamic Standing - Balance Support: Bilateral upper extremity supported Dynamic Standing - Level of Assistance: 4: Min assist Extremity/Trunk Assessment RUE Assessment RUE Assessment: Exceptions to [facility] Passive Range of Motion (PROM) Comments: WFL Active Range of Motion (AROM) Comments: Patient with AROM WFL at shoulder (>90 degrees to protect shoulder joint), wrist, and digits (composite flexion/extension). Isolated digit flxion/extension with difficulty. General Strength Comments: 3+/5 grossly LUE Assessment LUE Assessment: Within Functional Limits Passive Range of Motion (PROM) Comments: WFL Active Range of Motion (AROM) Comments: WFL General Strength Comments: MMT WFL   Amaura Authier R Howerton-Davis 07/10/2020, 4:13 PM

## 2020-07-10 NOTE — Progress Notes (Signed)
Physical Therapy Session Note  Patient Details  Name: Peter Ryan MRN: [id number] Date of Birth: January 13, 1951  Today's Date: 07/10/2020 PT Individual Time: 1003-1047 and 1417-1530 PT Individual Time Calculation (min): 44 min and 73 min  Short Term Goals: Week 4:  PT Short Term Goal 1 (Week 4): STG=LTG due to ELOS  Skilled Therapeutic Interventions/Progress Updates: Pt presented in w/c agreeable to therapy. Pt denies pain during session. Session focused on functional mobility in preparation for d/c. Pt transported to ortho gym and participated in car transfer requiring minA for BLE management into and out of vehicle. Pt then transported to rehab gym and participated in ascending/descending stairs with B rails with minA. Pt did demonstrate fair awareness for sequencing and decreasing R knee flexion while on stairs. PTA reinforced education regarding safety at home, energy conservation, and continuing mobility upon d/c. Pt and wife verbalized understanding. Pt also provided with HEP as pt and wife indicated may be 72 hours post d/c until therapist is able to come to house. PTA reviewed w/c uasge with wife as has a couple of questions. Pt then participated in w/c mobility via hemi technique requiring minA intermittently requiring verbal cues for usage of RLE but continues to demonstrate improved technique. Pt transported remaining distance to room and left in w/c with wife present and needs met.   Tx2: Pt presented in w/c agreeable to therapy. Pt denies pain during session. Pt transported to rehab gym hallway and participated in ambulation with RW. Pt ambulated 34f, 359fand 1536fith RW and minA fading to modA with fatigue. Pt continues to require verbal cues for increasing step length as well as keeping close proximity to RW. Pt then participated in seated and standing balance activities including sitting and using rebounder for dynamic balance for forced use of RUE as well as bouts of horseshoes in  standing. Pt demonstrated much improved dynamic sitting balance however continued to require intermittent cues for dynamic balance. PTA then continued education regarding home set up and safety in home with PTA providing answered to any queries. Pt transported back to room at end of session and performed stand pivot transfer to bed CGA overall. Pt required minA for BLE management to return to supine. Pt left in bed with bed alarm on, call bell within reach and needs met.      Therapy Documentation Precautions:  Precautions Precautions: Fall, Other (comment) Precaution Comments: R hemiparesis Restrictions Weight Bearing Restrictions: No Other Position/Activity Restrictions: protect weak RUE General:   Vital Signs: Therapy Vitals Temp: 97.7 F (36.5 C) Pulse Rate: 64 Resp: 18 BP: (!) 159/68 Patient Position (if appropriate): Sitting Oxygen Therapy SpO2: 98 % Pain: Pain Assessment Pain Scale: 0-10 Pain Score: 0-No pain Mobility: Bed Mobility Bed Mobility: Rolling Right;Rolling Left;Sit to Supine;Supine to Sit Rolling Right: Minimal Assistance - Patient > 75% Rolling Left: Minimal Assistance - Patient > 75% Right Sidelying to Sit: Minimal Assistance - Patient > 75% Supine to Sit: Minimal Assistance - Patient > 75% Sitting - Scoot to Edge of Bed: Supervision/Verbal cueing Sit to Supine: Minimal Assistance - Patient > 75% Transfers Transfers: Sit to Stand;Stand to Sit Sit to Stand: Contact Guard/Touching assist Stand to Sit: Contact Guard/Touching assist Transfer (Assistive device): Rolling walker Locomotion : Gait Ambulation: Yes Gait Assistance: Minimal Assistance - Patient > 75% Gait Distance (Feet): 30 Feet Assistive device: Rolling walker Gait Assistance Details: Verbal cues for technique;Verbal cues for sequencing Gait Gait: Yes Gait Pattern: Impaired Gait Pattern: Decreased step length - right;Step-to  pattern;Decreased weight shift to left;Decreased dorsiflexion -  right;Right flexed knee in stance  Trunk/Postural Assessment : Cervical Assessment Cervical Assessment: Exceptions to [facility] (forward head) Thoracic Assessment Thoracic Assessment: Exceptions to [facility] (rounded shoulders) Lumbar Assessment Lumbar Assessment: Exceptions to [facility] (posterior pelvic tilt) Postural Control Postural Control: Deficits on evaluation Righting Reactions: slighly delayed Postural Limitations: decreased  Balance: Balance Balance Assessed: Yes Static Sitting Balance Static Sitting - Balance Support: Feet supported Static Sitting - Level of Assistance: 5: Stand by assistance Dynamic Sitting Balance Dynamic Sitting - Balance Support: Feet supported Dynamic Sitting - Level of Assistance: 5: Stand by assistance Dynamic Sitting - Balance Activities: [employer];Reaching for objects Static Standing Balance Static Standing - Balance Support: Bilateral upper extremity supported Static Standing - Level of Assistance: 4: Min assist;5: Stand by assistance Dynamic Standing Balance Dynamic Standing - Balance Support: Bilateral upper extremity supported Dynamic Standing - Level of Assistance: 4: Min assist Exercises:   Other Treatments:      Therapy/Group: Individual Therapy  Tomiko Schoon 07/10/2020, 4:24 PM

## 2020-07-10 NOTE — Progress Notes (Signed)
[facility] PHYSICAL MEDICINE & REHABILITATION PROGRESS NOTE   Subjective/Complaints:  No issues overnite , reviewed labs , pt aware of d/c in am   ROS:   Pt denies SOB, abd pain, CP, N/V/C/D, and vision changes   Objective:   No results found. No results for input(s): WBC, HGB, HCT, PLT in the last 72 hours. Recent Labs    07/07/20 0906 07/10/20 0639  NA 141 139  K 3.0* 4.3  CL 102 107  CO2 29 20*  GLUCOSE 164* 124*  BUN 12 16  CREATININE 0.84 0.95  CALCIUM 8.9 8.5*    Intake/Output Summary (Last 24 hours) at 07/10/2020 0824 Last data filed at 07/10/2020 0454 Gross per 24 hour  Intake 260 ml  Output 600 ml  Net -340 ml       Physical Exam: Vital Signs Blood pressure (!) 142/78, pulse 62, temperature 97.7 F (36.5 C), resp. rate 18, height 5\' 10"  (1.778 m), weight 129.3 kg, SpO2 95 %.   General: No acute distress Mood and affect are appropriate Heart: Regular rate and rhythm no rubs murmurs or extra sounds Lungs: Clear to auscultation, breathing unlabored, no rales or wheezes Abdomen: Positive bowel sounds, soft nontender to palpation, nondistended Extremities: No clubbing, cyanosis, or edema Skin: No evidence of breakdown, no evidence of rash  Motor 3 to 3+ RUE and RLE,  5/5 on left  Musculoskeletal: Full passiverange of motion in all 4 extremities, reduced active range of motion right upper extremity. No joint swelling  Assessment/Plan: 1. Functional deficits secondary to Left pontine infarct  which require 3+ hours per day of interdisciplinary therapy in a comprehensive inpatient rehab setting.  Physiatrist is providing close team supervision and 24 hour management of active medical problems listed below.  Physiatrist and rehab team continue to assess barriers to discharge/monitor patient progress toward functional and medical goals  Care Tool:  Bathing  Bathing activity did not occur:  (N/A) Body parts bathed by patient: Right arm, Chest,  Abdomen, Front perineal area, Face, Right upper leg, Left upper leg, Left arm, Right lower leg, Left lower leg   Body parts bathed by helper: Buttocks, Right lower leg, Left lower leg     Bathing assist Assist Level: Minimal Assistance - Patient > 75%     Upper Body Dressing/Undressing Upper body dressing   What is the patient wearing?: Pull over shirt    Upper body assist Assist Level: Minimal Assistance - Patient > 75%    Lower Body Dressing/Undressing Lower body dressing      What is the patient wearing?: Incontinence brief, Pants     Lower body assist Assist for lower body dressing: Moderate Assistance - Patient 50 - 74%     Toileting Toileting    Toileting assist Assist for toileting: Maximal Assistance - Patient 25 - 49% Assistive Device Comment: Bariatric BSC   Transfers Chair/bed transfer  Transfers assist  Chair/bed transfer activity did not occur: N/A  Chair/bed transfer assist level: Contact Guard/Touching assist     Locomotion Ambulation   Ambulation assist   Ambulation activity did not occur: N/A  Assist level: Minimal Assistance - Patient > 75% Assistive device: Walker-rolling Max distance: 50   Walk 10 feet activity   Assist  Walk 10 feet activity did not occur: N/A  Assist level: Minimal Assistance - Patient > 75% Assistive device: Walker-rolling   Walk 50 feet activity   Assist Walk 50 feet with 2 turns activity did not occur: N/A  Assist level:  Minimal Assistance - Patient > 75%      Walk 150 feet activity   Assist Walk 150 feet activity did not occur: N/A         Walk 10 feet on uneven surface  activity   Assist Walk 10 feet on uneven surfaces activity did not occur: N/A         Wheelchair     Assist Will patient use wheelchair at discharge?: No (Per PT goals )      Wheelchair assist level: Minimal Assistance - Patient > 75% Max wheelchair distance: 50    Wheelchair 50 feet with 2 turns  activity    Assist        Assist Level: Minimal Assistance - Patient > 75%   Wheelchair 150 feet activity     Assist      Assist Level: Moderate Assistance - Patient 50 - 74%   Blood pressure (!) 142/78, pulse 62, temperature 97.7 F (36.5 C), resp. rate 18, height 5\' 10"  (1.778 m), weight 129.3 kg, SpO2 95 %.  Medical Problem List and Plan: 1.  Right hemiparesis secondary to left pontine infarction.  Follow-up outpatient cardiology services to discuss loop recorder             -patient may shower             -ELOS/Goals: 10/12      2.  Antithrombotics: -DVT/anticoagulation: SCDs             -antiplatelet therapy: Aspirin 325 mg daily 3. Pain Management: Tylenol as needed. Well controlled 4. Mood: Provide emotional support. In positive mood.              -antipsychotic agents: N/A 5. Neuropsych: This patient is capable of making decisions on his own behalf. 6. Skin/Wound Care: Routine skin checks 7. Fluids/Electrolytes/Nutrition: Routine in and outs.  Weekly CMP 8.  Hypertension.  Norvasc 10 mg daily, Cozaar 100 mg daily.   Remains with elevated systolic, HRs on low side so no BB, will trial HCTZ, add KCL tab given last K+ was 3.6 , recheck BMET  10/10- BP 130s-140s/60s-70s- better- con't regimen Vitals:   07/09/20 2015 07/10/20 0454  BP: (!) 158/76 (!) 142/78  Pulse: 66 62  Resp: 18 18  Temp: 97.8 F (36.6 C) 97.7 F (36.5 C)  SpO2: 97% 95%  9.  Diabetes mellitus with hyperglycemia.  Hemoglobin A1c 7.5.  SSI.          CBG (last 3)  Recent Labs    07/09/20 1648 07/09/20 2235 07/10/20 0603  GLUCAP 102* 146* 123*  10/10- BGs somewhat elevated, but was good yesterday- monitor another 24 hours for trends 10.  BPH.  Proscar.               emptying well 11.  Hyperlipidemia.  Lipitor--discussed rationale for statin after stroke and in general. He wishes to hold regardless 12.  History of gout.  Zyloprim 300 mg daily.  Monitor for any gout flareups 13.   History of non-Hodgkin's lymphoma status post chemotherapy.  Follow-up Dr Randa Evens as outpatient. 14.  Morbid obesity.  BMI 40.89.  Dietary follow-up 15.  History of rectal outlet bleeding.  Felt to be related to possible hemorrhoids.f/u  Hgb normal 16. Constipation improved with laxatives: emptying regularly. Generally continent, but had one inc stool yesterday  17. Hypokalemia  10/9- K+ 3.0- will replete KCl 40 mEq x3 doses  10/11 K+ up to 4.3     LOS:  26 days A FACE TO FACE EVALUATION WAS PERFORMED  Charlett Blake 07/10/2020, 8:24 AM

## 2020-07-10 NOTE — Progress Notes (Signed)
Occupational Therapy Session Note  Patient Details  Name: Peter Ryan MRN: [id number] Date of Birth: [birth year]  Today's Date: 07/10/2020 OT Individual Time: 1300-1340 OT Individual Time Calculation (min): 40 min    Short Term Goals: Week 3:  OT Short Term Goal 1 (Week 3): STGs=LTGs due to ELOS  Skilled Therapeutic Interventions/Progress Updates:    Pt resting in w/c upon arrival with wife present.  OT intervention with focus on BSC/toilet transfers and toileting. Pt practiced with therapist X 1 and wife X 1. Sit<>stand with CGA and standing balance with CGA. Pt stood X 3.  Pt did not feel stable first two times and sat back in w/c. Pt required mod verbal cues to extend RLE and shift weight onto LLE. Pt's wife providing appropriate verbal cues. Discussed purchase of bidet to assist with hygiene. Wife provided resources to continue research. Discussed safety with transitional movements. Recommended that wife or other caregiver always be present for assistance with transfers and standing. Pt and wife verbalized understanding. Pt remained in w/c with all needs within reach and wife present.     Therapy Documentation Precautions:  Precautions Precautions: Fall, Other (comment) Precaution Comments: R hemiparesis Restrictions Weight Bearing Restrictions: No Other Position/Activity Restrictions: protect weak RUE  Pain:  Pt denies pain this afternoon   Therapy/Group: Individual Therapy  Leroy Libman 07/10/2020, 1:43 PM

## 2020-07-11 LAB — GLUCOSE, CAPILLARY: Glucose-Capillary: 127 mg/dL — ABNORMAL HIGH (ref 70–99)

## 2020-07-11 NOTE — Progress Notes (Signed)
Inpatient Rehabilitation Care Coordinator  Discharge Note  The overall goal for the admission was met for:   Discharge location: Yes, Home  Length of Stay: Yes, 27 Days  Discharge activity level: Yes, ambulatory level Min Assist  Home/community participation: Yes  Services provided included: MD, RD, PT, OT, SLP, RN, CM, TR, Pharmacy, Neuropsych and SW  Financial Services: Medicare  Follow-up services arranged: Home Health: [facility]   Comments (or additional information): PT AND OT  Patient/Family verbalized understanding of follow-up arrangements: Yes  Individual responsible for coordination of the follow-up plan: [name] [phone]  Confirmed correct DME delivered: Dyanne Iha 07/11/2020    Dyanne Iha

## 2020-07-11 NOTE — Plan of Care (Signed)
  Problem: RH BOWEL ELIMINATION Goal: RH STG MANAGE BOWEL WITH ASSISTANCE Description: STG Manage Bowel with min Assistance. Outcome: Progressing Goal: RH STG MANAGE BOWEL W/MEDICATION W/ASSISTANCE Description: STG Manage Bowel with Medication with min Assistance. Outcome: Progressing   Problem: RH BLADDER ELIMINATION Goal: RH STG MANAGE BLADDER WITH ASSISTANCE Description: STG Manage Bladder With min Assistance Outcome: Progressing   

## 2020-07-11 NOTE — Plan of Care (Signed)
Problem: RH PAIN MANAGEMENT Goal: RH STG PAIN MANAGED AT OR BELOW PT'S PAIN GOAL Description: <3 on a 0-10 pain scale 07/11/2020 0959 by Mikki Harbor, RN Outcome: Completed/Met 07/11/2020 0959 by Mikki Harbor, RN Outcome: Progressing 07/11/2020 0958 by Mikki Harbor, RN Outcome: Progressing   Problem: RH KNOWLEDGE DEFICIT Goal: RH STG INCREASE KNOWLEDGE OF DIABETES Description: Patient will demonstrate knowledge of diabetes medications, diabetes diet, blood sugar parameters, and follow up care with the MD with min assist from the [facility] staff. 07/11/2020 0959 by Mikki Harbor, RN Outcome: Completed/Met 07/11/2020 0959 by Mikki Harbor, RN Outcome: Progressing 07/11/2020 0958 by Mikki Harbor, RN Outcome: Progressing Goal: RH STG INCREASE KNOWLEDGE OF HYPERTENSION Description: Patient will demonstrate knowledge of HTN medications, HTN diet, blood pressure parameters, and follow up care with the MD with min assist from the CIR staff.  07/11/2020 0959 by Mikki Harbor, RN Outcome: Completed/Met 07/11/2020 0959 by Mikki Harbor, RN Outcome: Progressing 07/11/2020 0958 by Mikki Harbor, RN Outcome: Progressing Goal: RH STG INCREASE KNOWLEGDE OF HYPERLIPIDEMIA Description: Patient will demonstrate knowledge of HLD medications, and follow up care with the MD with min assist from the CIR staff.  07/11/2020 0959 by Mikki Harbor, RN Outcome: Completed/Met 07/11/2020 0959 by Mikki Harbor, RN Outcome: Progressing 07/11/2020 0958 by Mikki Harbor, RN Outcome: Progressing Goal: RH STG INCREASE KNOWLEDGE OF STROKE PROPHYLAXIS Description: Patient will demonstrate knowledge of secondary medications for prevention of future strokes, and follow up care with the MD with min assist from the CIR staff.  07/11/2020 0959 by Mikki Harbor, RN Outcome: Completed/Met 07/11/2020 0959 by Mikki Harbor, RN Outcome:  Progressing 07/11/2020 0958 by Mikki Harbor, RN Outcome: Progressing

## 2020-07-11 NOTE — Plan of Care (Signed)
  Problem: Consults Goal: RH STROKE PATIENT EDUCATION Description: See Patient Education module for education specifics  07/11/2020 0959 by Mikki Harbor, RN Outcome: Progressing 07/11/2020 0958 by Mikki Harbor, RN Outcome: Progressing Goal: Nutrition Consult-if indicated 07/11/2020 0959 by Mikki Harbor, RN Outcome: Progressing 07/11/2020 0958 by Mikki Harbor, RN Outcome: Progressing Goal: Diabetes Guidelines if Diabetic/Glucose > 140 Description: If diabetic or lab glucose is > 140 mg/dl - Initiate Diabetes/Hyperglycemia Guidelines & Document Interventions  07/11/2020 0959 by Mikki Harbor, RN Outcome: Progressing 07/11/2020 0958 by Mikki Harbor, RN Outcome: Progressing   Problem: RH BOWEL ELIMINATION Goal: RH STG MANAGE BOWEL WITH ASSISTANCE Description: STG Manage Bowel with min Assistance. 07/11/2020 0959 by Mikki Harbor, RN Outcome: Progressing 07/11/2020 0958 by Mikki Harbor, RN Outcome: Progressing Goal: RH STG MANAGE BOWEL W/MEDICATION W/ASSISTANCE Description: STG Manage Bowel with Medication with min Assistance. 07/11/2020 0959 by Mikki Harbor, RN Outcome: Progressing 07/11/2020 0958 by Mikki Harbor, RN Outcome: Progressing   Problem: RH BLADDER ELIMINATION Goal: RH STG MANAGE BLADDER WITH ASSISTANCE Description: STG Manage Bladder With min Assistance 07/11/2020 0959 by Mikki Harbor, RN Outcome: Progressing 07/11/2020 0958 by Mikki Harbor, RN Outcome: Progressing   Problem: RH SKIN INTEGRITY Goal: RH STG MAINTAIN SKIN INTEGRITY WITH ASSISTANCE Description: STG Maintain Skin Integrity With min Assistance. 07/11/2020 0959 by Mikki Harbor, RN Outcome: Progressing 07/11/2020 0958 by Mikki Harbor, RN Outcome: Progressing Goal: RH STG ABLE TO PERFORM INCISION/WOUND CARE W/ASSISTANCE Description: STG Able To Perform Incision/Wound Care With supervision Assistance. 07/11/2020 0959 by  Mikki Harbor, RN Outcome: Progressing 07/11/2020 0958 by Mikki Harbor, RN Outcome: Progressing   Problem: RH SAFETY Goal: RH STG ADHERE TO SAFETY PRECAUTIONS W/ASSISTANCE/DEVICE Description: STG Adhere to Safety Precautions With Supervision Assistance and appropriate assistive Device. 07/11/2020 0959 by Mikki Harbor, RN Outcome: Progressing 07/11/2020 0958 by Mikki Harbor, RN Outcome: Progressing   Problem: RH PAIN MANAGEMENT Goal: RH STG PAIN MANAGED AT OR BELOW PT'S PAIN GOAL Description: <3 on a 0-10 pain scale 07/11/2020 0959 by Mikki Harbor, RN Outcome: Progressing 07/11/2020 0958 by Mikki Harbor, RN Outcome: Progressing   Problem: RH KNOWLEDGE DEFICIT Goal: RH STG INCREASE KNOWLEDGE OF DIABETES Description: Patient will demonstrate knowledge of diabetes medications, diabetes diet, blood sugar parameters, and follow up care with the MD with min assist from the CIR staff. 07/11/2020 0959 by Mikki Harbor, RN Outcome: Progressing 07/11/2020 0958 by Mikki Harbor, RN Outcome: Progressing Goal: RH STG INCREASE KNOWLEDGE OF HYPERTENSION Description: Patient will demonstrate knowledge of HTN medications, HTN diet, blood pressure parameters, and follow up care with the MD with min assist from the [place] staff.  07/11/2020 0959 by Mikki Harbor, RN Outcome: Progressing 07/11/2020 0958 by Mikki Harbor, RN Outcome: Progressing Goal: RH STG INCREASE KNOWLEGDE OF HYPERLIPIDEMIA Description: Patient will demonstrate knowledge of HLD medications, and follow up care with the MD with min assist from the CIR staff.  07/11/2020 0959 by Mikki Harbor, RN Outcome: Progressing 07/11/2020 0958 by Mikki Harbor, RN Outcome: Progressing Goal: RH STG INCREASE KNOWLEDGE OF STROKE PROPHYLAXIS Description: Patient will demonstrate knowledge of secondary medications for prevention of future strokes, and follow up care with the MD with  min assist from the CIR staff.  07/11/2020 0959 by Mikki Harbor, RN Outcome: Progressing 07/11/2020 0958 by Mikki Harbor, RN Outcome: Progressing

## 2020-07-11 NOTE — Progress Notes (Signed)
Pt discharge education completed by PA. Pt and wife assisted to pack belongings. Pt assisted to vehicle by staff

## 2020-07-12 ENCOUNTER — Telehealth: Payer: Medicare Other | Admitting: Cardiology

## 2020-07-13 ENCOUNTER — Telehealth: Payer: Self-pay

## 2020-07-13 NOTE — Telephone Encounter (Signed)
Please call Marjory Lies ASAP. Mr Baba has not been contacted by any agency for his discharge after care.    Call back ph# [phone].  Thank you,

## 2020-07-13 NOTE — Telephone Encounter (Signed)
Transitional Care Call--who you spoke with Mr & Mrs Silliman.    1. Are you/is patient experiencing any problems since coming home? None. 2.   Are there any questions regarding any aspect of care? None.  3. Are there any questions regarding medications administration/dosing? Medication list reviewed. No questions.  Are meds being taken as prescribed? Yes. Patient should review meds with caller to confirm. Done 4. Have there been any falls? No falls.  5. Has Home Health been to the house and/or have they contacted you? No  If not, have you tried to contact them? Yes. [name] (wife) called and and was told he was not in the system with [facility]  Can we help you contact them? I will send a e-mail to Erlene Quan SW to let her know today.  6. Are bowels and bladder emptying properly? No problem.  Are there any unexpected incontinence issues? No.  If applicable, is patient following bowel/bladder programs? N/A.  7. Any fevers, problems with breathing, unexpected pain? No.  8. Are there any skin problems or new areas of breakdown? None.  9. Has the patient/family member arranged specialty MD follow up (ie cardiology/neurology/renal/surgical/etc)? Yes, but had to reschedule.  Can we help arrange? No help needed. Advised to call back if any help is needed.  10. Does the patient need any other services or support that we can help arrange? No.  11. Are caregivers following through as expected in assisting the patient? Still waiting on [place].         11. Has the patient quit smoking, drinking alcohol, or using drugs as recommended? Patient does not indulge.   Appointment Date/Time/ Arrival time/ and who they are seeing [place]

## 2020-07-13 NOTE — Telephone Encounter (Signed)
Peter Ryan advised to call [facility] back for Neurology follow up appointment. Per referral note a message was left on Peter Ryan voice mail.

## 2020-07-15 DIAGNOSIS — Z87891 Personal history of nicotine dependence: Secondary | ICD-10-CM | POA: Diagnosis not present

## 2020-07-15 DIAGNOSIS — M109 Gout, unspecified: Secondary | ICD-10-CM | POA: Diagnosis not present

## 2020-07-15 DIAGNOSIS — E1165 Type 2 diabetes mellitus with hyperglycemia: Secondary | ICD-10-CM | POA: Diagnosis not present

## 2020-07-15 DIAGNOSIS — I10 Essential (primary) hypertension: Secondary | ICD-10-CM | POA: Diagnosis not present

## 2020-07-15 DIAGNOSIS — M1990 Unspecified osteoarthritis, unspecified site: Secondary | ICD-10-CM | POA: Diagnosis not present

## 2020-07-15 DIAGNOSIS — N400 Enlarged prostate without lower urinary tract symptoms: Secondary | ICD-10-CM | POA: Diagnosis not present

## 2020-07-15 DIAGNOSIS — C8590 Non-Hodgkin lymphoma, unspecified, unspecified site: Secondary | ICD-10-CM | POA: Diagnosis not present

## 2020-07-15 DIAGNOSIS — I69322 Dysarthria following cerebral infarction: Secondary | ICD-10-CM | POA: Diagnosis not present

## 2020-07-15 DIAGNOSIS — K7460 Unspecified cirrhosis of liver: Secondary | ICD-10-CM | POA: Diagnosis not present

## 2020-07-15 DIAGNOSIS — Z7982 Long term (current) use of aspirin: Secondary | ICD-10-CM | POA: Diagnosis not present

## 2020-07-15 DIAGNOSIS — E785 Hyperlipidemia, unspecified: Secondary | ICD-10-CM | POA: Diagnosis not present

## 2020-07-15 DIAGNOSIS — Z6838 Body mass index (BMI) 38.0-38.9, adult: Secondary | ICD-10-CM | POA: Diagnosis not present

## 2020-07-15 DIAGNOSIS — I639 Cerebral infarction, unspecified: Secondary | ICD-10-CM | POA: Diagnosis not present

## 2020-07-15 DIAGNOSIS — I69351 Hemiplegia and hemiparesis following cerebral infarction affecting right dominant side: Secondary | ICD-10-CM | POA: Diagnosis not present

## 2020-07-15 DIAGNOSIS — G629 Polyneuropathy, unspecified: Secondary | ICD-10-CM | POA: Diagnosis not present

## 2020-07-15 DIAGNOSIS — Z7984 Long term (current) use of oral hypoglycemic drugs: Secondary | ICD-10-CM | POA: Diagnosis not present

## 2020-07-15 DIAGNOSIS — Z9181 History of falling: Secondary | ICD-10-CM | POA: Diagnosis not present

## 2020-07-15 DIAGNOSIS — K219 Gastro-esophageal reflux disease without esophagitis: Secondary | ICD-10-CM | POA: Diagnosis not present

## 2020-07-18 DIAGNOSIS — I69351 Hemiplegia and hemiparesis following cerebral infarction affecting right dominant side: Secondary | ICD-10-CM | POA: Diagnosis not present

## 2020-07-18 DIAGNOSIS — E1165 Type 2 diabetes mellitus with hyperglycemia: Secondary | ICD-10-CM | POA: Diagnosis not present

## 2020-07-18 DIAGNOSIS — M1990 Unspecified osteoarthritis, unspecified site: Secondary | ICD-10-CM | POA: Diagnosis not present

## 2020-07-18 DIAGNOSIS — I10 Essential (primary) hypertension: Secondary | ICD-10-CM | POA: Diagnosis not present

## 2020-07-18 DIAGNOSIS — M109 Gout, unspecified: Secondary | ICD-10-CM | POA: Diagnosis not present

## 2020-07-18 DIAGNOSIS — I69322 Dysarthria following cerebral infarction: Secondary | ICD-10-CM | POA: Diagnosis not present

## 2020-07-19 DIAGNOSIS — I69322 Dysarthria following cerebral infarction: Secondary | ICD-10-CM | POA: Diagnosis not present

## 2020-07-19 DIAGNOSIS — M109 Gout, unspecified: Secondary | ICD-10-CM | POA: Diagnosis not present

## 2020-07-19 DIAGNOSIS — I10 Essential (primary) hypertension: Secondary | ICD-10-CM | POA: Diagnosis not present

## 2020-07-19 DIAGNOSIS — M1990 Unspecified osteoarthritis, unspecified site: Secondary | ICD-10-CM | POA: Diagnosis not present

## 2020-07-19 DIAGNOSIS — I69351 Hemiplegia and hemiparesis following cerebral infarction affecting right dominant side: Secondary | ICD-10-CM | POA: Diagnosis not present

## 2020-07-19 DIAGNOSIS — E1165 Type 2 diabetes mellitus with hyperglycemia: Secondary | ICD-10-CM | POA: Diagnosis not present

## 2020-07-20 ENCOUNTER — Encounter: Payer: Medicare Other | Attending: Registered Nurse | Admitting: Registered Nurse

## 2020-07-20 ENCOUNTER — Other Ambulatory Visit: Payer: Self-pay

## 2020-07-20 VITALS — BP 131/77 | HR 69 | Temp 98.2°F | Ht 70.0 in | Wt 271.8 lb

## 2020-07-20 DIAGNOSIS — I639 Cerebral infarction, unspecified: Secondary | ICD-10-CM

## 2020-07-20 DIAGNOSIS — I10 Essential (primary) hypertension: Secondary | ICD-10-CM

## 2020-07-20 DIAGNOSIS — E1165 Type 2 diabetes mellitus with hyperglycemia: Secondary | ICD-10-CM

## 2020-07-20 DIAGNOSIS — I6529 Occlusion and stenosis of unspecified carotid artery: Secondary | ICD-10-CM

## 2020-07-20 DIAGNOSIS — G8191 Hemiplegia, unspecified affecting right dominant side: Secondary | ICD-10-CM

## 2020-07-20 NOTE — Progress Notes (Signed)
Subjective:    Patient ID: Peter Ryan, male    DOB: 05-May-1951, 69 y.o.   MRN: [id number]  HPI: Peter Ryan is a 69 y.o. male who is here for Transitional Care Visit for follow up of his Left Pontine Cerebrovascular Accident, Right Hemiparesis, Essential Hypertension, Controlled Type 2 DM and Morbid Obesity. He presented to [facility] on 06/05/2020 with complaints of right sided weakness and slurred speech via EMS. Neurology Consulted.  CT Head WO Contrast:  IMPRESSION: 1. No acute intracranial findings. 2. Chronic small vessel ischemic changes in the periventricular white matter and basal ganglia.  CT Angio: Head W or WO Contrast:  IMPRESSION: 1. No large vessel occlusion. 2. Intracranial atherosclerosis including severe stenoses of a left M2 branch vessel and of the median artery of the corpus callosum. 3. Strongly dominant right vertebral artery with possible moderate proximal V1 stenosis. 4. Hypoplastic left vertebral artery with a severe distal V4 stenosis. 5. Widely patent cervical carotid arteries.  MR Brain WO Contrast:  IMPRESSION: 1. Acute left pontine and right frontal white matter infarcts. 2. Severe chronic small vessel ischemic disease.  Peter Ryan was admitted to inpatient rehabilitation on 06/14/2020 and discharged home on 07/11/2020. He is receiving Home Health Therapy with [facility]. He is walking with therapist only. He denies any pain. He rates his pain 0. Also reports he has a good appetite.    Pain Inventory Average Pain 0 Pain Right Now 0 My pain is na  LOCATION OF PAIN  na  BOWEL Number of stools per week: every other day Oral laxative use Yes  Type of laxative miralax Enema or suppository use No  History of colostomy No  Incontinent No   BLADDER Normal In and out cath, frequency na Able to self cath No  Bladder incontinence No  Frequent urination No  Leakage with coughing No  Difficulty starting stream No  Incomplete  bladder emptying No    Mobility walk with assistance use a walker how many minutes can you walk? 2 ability to climb steps?  no do you drive?  no use a wheelchair needs help with transfers  Function retired I need assistance with the following:  dressing, bathing, toileting, meal prep, household duties and shopping  Neuro/Psych weakness trouble walking  Prior Studies TC appt  Physicians involved in your care TC appt   Family History  Problem Relation Age of Onset  . Uterine cancer Mother   . Diabetes Mother   . Cancer Mother   . Cancer Maternal Uncle   . Bladder Cancer Neg Hx   . Kidney disease Neg Hx   . Prostate cancer Neg Hx    Social History   Socioeconomic History  . Marital status: Married    Spouse name: Not on file  . Number of children: Not on file  . Years of education: Not on file  . Highest education level: Not on file  Occupational History  . Not on file  Tobacco Use  . Smoking status: Former Smoker    Packs/day: 1.50    Years: 8.00    Pack years: 12.00    Types: Cigarettes    Quit date: 05/29/1974    Years since quitting: 46.1  . Smokeless tobacco: Never Used  . Tobacco comment: quit 40 years ago  Vaping Use  . Vaping Use: Never used  Substance and Sexual Activity  . Alcohol use: No    Alcohol/week: 0.0 standard drinks    Comment: rarely  . Drug  use: No  . Sexual activity: Not on file  Other Topics Concern  . Not on file  Social History Narrative  . Not on file   Social Determinants of Health   Financial Resource Strain:   . Difficulty of Paying Living Expenses: Not on file  Food Insecurity:   . Worried About Charity fundraiser in the Last Year: Not on file  . Ran Out of Food in the Last Year: Not on file  Transportation Needs:   . Lack of Transportation (Medical): Not on file  . Lack of Transportation (Non-Medical): Not on file  Physical Activity:   . Days of Exercise per Week: Not on file  . Minutes of Exercise per  Session: Not on file  Stress:   . Feeling of Stress : Not on file  Social Connections:   . Frequency of Communication with Friends and Family: Not on file  . Frequency of Social Gatherings with Friends and Family: Not on file  . Attends Religious Services: Not on file  . Active Member of Clubs or Organizations: Not on file  . Attends Archivist Meetings: Not on file  . Marital Status: Not on file   Past Surgical History:  Procedure Laterality Date  . hydrocelectomy    . IR FLUORO GUIDE PORT INSERTION RIGHT  04/11/2017  . IR REMOVAL TUN ACCESS W/ PORT W/O FL MOD SED  05/29/2018  . PILONIDAL CYST EXCISION    . TEE WITHOUT CARDIOVERSION N/A 06/09/2020   Procedure: TRANSESOPHAGEAL ECHOCARDIOGRAM (TEE);  Surgeon: Minna Merritts, MD;  Location: [facility] ORS;  Service: Cardiovascular;  Laterality: N/A;   Past Medical History:  Diagnosis Date  . Arthritis   . BPH (benign prostatic hyperplasia)   . Cancer ([employer])    Non-Hodgkins lymphoma  . Chronic prostatitis   . Cirrhosis of liver ([place])   . Diabetes mellitus without complication ([place])   . Dyslipidemia   . Elevated PSA   . Gastric reflux   . Gout   . HTN (hypertension)   . Neuropathy   . Over weight   . Psoriasis   . Testicular pain, right   . Urinary frequency    BP 131/77   Pulse 69   Temp 98.2 F (36.8 C)   Ht 5\' 10"  (1.778 m)   Wt 271 lb 12.8 oz (123.3 kg)   SpO2 97%   BMI 39.00 kg/m   Opioid Risk Score:   Fall Risk Score:  `1  Depression screen PHQ 2/9  Depression screen [facility] 2/9 07/20/2020 05/02/2020 01/29/2018 12/02/2016  Decreased Interest 1 0 0 0  Down, Depressed, Hopeless 1 0 0 3  PHQ - 2 Score 2 0 0 3  Altered sleeping 0 0 0 0  Tired, decreased energy 0 0 2 1  Change in appetite 0 0 3 0  Feeling bad or failure about yourself  1 0 0 0  Trouble concentrating 0 0 0 0  Moving slowly or fidgety/restless 0 0 0 0  Suicidal thoughts 0 0 0 0  PHQ-9 Score 3 0 5 4  Difficult doing work/chores Not difficult at  all Not difficult at all Not difficult at all -  Some recent data might be hidden     Review of Systems  Musculoskeletal: Positive for gait problem.  Neurological: Positive for weakness.  All other systems reviewed and are negative.      Objective:   Physical Exam Vitals and nursing note reviewed.  Constitutional:  Appearance: Normal appearance.  Cardiovascular:     Rate and Rhythm: Normal rate and regular rhythm.     Pulses: Normal pulses.     Heart sounds: Normal heart sounds.  Pulmonary:     Effort: Pulmonary effort is normal.     Breath sounds: Normal breath sounds.  Musculoskeletal:     Cervical back: Normal range of motion and neck supple.     Comments: Normal Muscle Bulk and Muscle Testing Reveals:  Upper Extremities: Right: Decreased ROM 45 Degrees and  and Muscle Strength 4/5 Left Upper Extremity: Full ROM and Muscle Strength 5/5 Lower Extremities: Right: Decreased ROM and Muscle Strength 3/5 Left Lower Extremity: Full ROM and Muscle Strength 5/5 Arrived in wheelchair   Skin:    General: Skin is warm and dry.  Neurological:     Mental Status: He is alert and oriented to person, place, and time.  Psychiatric:        Mood and Affect: Mood normal.        Behavior: Behavior normal.           Assessment & Plan:  1.Left Pontine Cerebrovascular Accident: Right Hemiparesis: Continue Home Health Therapy with [facility].He has a Scheduled appointment with Neurology.  2.Essential Hypertension: Continue current medication regimen. PCP Following.  3. Controlled Type 2 DM: Continue current medication regimen. PCP Following,  4. Morbid Obesity.Continue Healthy Diet regimen. Continue to monitor.   20 minutes of face to face patient care time was spent during this visit. All questions were encouraged and answered.  F/U in 4-6 weeks with Dr Letta Pate.

## 2020-07-21 ENCOUNTER — Telehealth: Payer: Self-pay | Admitting: Family Medicine

## 2020-07-21 DIAGNOSIS — I69322 Dysarthria following cerebral infarction: Secondary | ICD-10-CM | POA: Diagnosis not present

## 2020-07-21 DIAGNOSIS — I69351 Hemiplegia and hemiparesis following cerebral infarction affecting right dominant side: Secondary | ICD-10-CM | POA: Diagnosis not present

## 2020-07-21 DIAGNOSIS — I10 Essential (primary) hypertension: Secondary | ICD-10-CM | POA: Diagnosis not present

## 2020-07-21 DIAGNOSIS — E1165 Type 2 diabetes mellitus with hyperglycemia: Secondary | ICD-10-CM | POA: Diagnosis not present

## 2020-07-21 DIAGNOSIS — M109 Gout, unspecified: Secondary | ICD-10-CM | POA: Diagnosis not present

## 2020-07-21 DIAGNOSIS — M1990 Unspecified osteoarthritis, unspecified site: Secondary | ICD-10-CM | POA: Diagnosis not present

## 2020-07-21 NOTE — Telephone Encounter (Signed)
Please advise 

## 2020-07-21 NOTE — Telephone Encounter (Signed)
Colletta Maryland OT with [facility] is calling and needs OT orders 1x1 and then 2x2 and finished with 1x1

## 2020-07-22 NOTE — Telephone Encounter (Signed)
ok 

## 2020-07-24 ENCOUNTER — Encounter: Payer: Self-pay | Admitting: Registered Nurse

## 2020-07-24 NOTE — Telephone Encounter (Signed)
Left detailed message on Stephanie's vm giving verbal okay.

## 2020-07-25 DIAGNOSIS — E1165 Type 2 diabetes mellitus with hyperglycemia: Secondary | ICD-10-CM | POA: Diagnosis not present

## 2020-07-25 DIAGNOSIS — I10 Essential (primary) hypertension: Secondary | ICD-10-CM | POA: Diagnosis not present

## 2020-07-25 DIAGNOSIS — I69322 Dysarthria following cerebral infarction: Secondary | ICD-10-CM | POA: Diagnosis not present

## 2020-07-25 DIAGNOSIS — I69351 Hemiplegia and hemiparesis following cerebral infarction affecting right dominant side: Secondary | ICD-10-CM | POA: Diagnosis not present

## 2020-07-25 DIAGNOSIS — M109 Gout, unspecified: Secondary | ICD-10-CM | POA: Diagnosis not present

## 2020-07-25 DIAGNOSIS — M1990 Unspecified osteoarthritis, unspecified site: Secondary | ICD-10-CM | POA: Diagnosis not present

## 2020-07-26 DIAGNOSIS — M109 Gout, unspecified: Secondary | ICD-10-CM | POA: Diagnosis not present

## 2020-07-26 DIAGNOSIS — I10 Essential (primary) hypertension: Secondary | ICD-10-CM | POA: Diagnosis not present

## 2020-07-26 DIAGNOSIS — E1165 Type 2 diabetes mellitus with hyperglycemia: Secondary | ICD-10-CM | POA: Diagnosis not present

## 2020-07-26 DIAGNOSIS — I69351 Hemiplegia and hemiparesis following cerebral infarction affecting right dominant side: Secondary | ICD-10-CM | POA: Diagnosis not present

## 2020-07-26 DIAGNOSIS — I69322 Dysarthria following cerebral infarction: Secondary | ICD-10-CM | POA: Diagnosis not present

## 2020-07-26 DIAGNOSIS — M1990 Unspecified osteoarthritis, unspecified site: Secondary | ICD-10-CM | POA: Diagnosis not present

## 2020-07-27 DIAGNOSIS — I10 Essential (primary) hypertension: Secondary | ICD-10-CM | POA: Diagnosis not present

## 2020-07-27 DIAGNOSIS — I69351 Hemiplegia and hemiparesis following cerebral infarction affecting right dominant side: Secondary | ICD-10-CM | POA: Diagnosis not present

## 2020-07-27 DIAGNOSIS — M109 Gout, unspecified: Secondary | ICD-10-CM | POA: Diagnosis not present

## 2020-07-27 DIAGNOSIS — E1165 Type 2 diabetes mellitus with hyperglycemia: Secondary | ICD-10-CM | POA: Diagnosis not present

## 2020-07-27 DIAGNOSIS — I69322 Dysarthria following cerebral infarction: Secondary | ICD-10-CM | POA: Diagnosis not present

## 2020-07-27 DIAGNOSIS — M1990 Unspecified osteoarthritis, unspecified site: Secondary | ICD-10-CM | POA: Diagnosis not present

## 2020-07-29 DIAGNOSIS — I69351 Hemiplegia and hemiparesis following cerebral infarction affecting right dominant side: Secondary | ICD-10-CM | POA: Diagnosis not present

## 2020-07-29 DIAGNOSIS — I10 Essential (primary) hypertension: Secondary | ICD-10-CM | POA: Diagnosis not present

## 2020-07-29 DIAGNOSIS — M1990 Unspecified osteoarthritis, unspecified site: Secondary | ICD-10-CM | POA: Diagnosis not present

## 2020-07-29 DIAGNOSIS — I69322 Dysarthria following cerebral infarction: Secondary | ICD-10-CM | POA: Diagnosis not present

## 2020-07-29 DIAGNOSIS — E1165 Type 2 diabetes mellitus with hyperglycemia: Secondary | ICD-10-CM | POA: Diagnosis not present

## 2020-07-29 DIAGNOSIS — M109 Gout, unspecified: Secondary | ICD-10-CM | POA: Diagnosis not present

## 2020-07-31 DIAGNOSIS — I69351 Hemiplegia and hemiparesis following cerebral infarction affecting right dominant side: Secondary | ICD-10-CM | POA: Diagnosis not present

## 2020-07-31 DIAGNOSIS — I69322 Dysarthria following cerebral infarction: Secondary | ICD-10-CM | POA: Diagnosis not present

## 2020-07-31 DIAGNOSIS — M109 Gout, unspecified: Secondary | ICD-10-CM | POA: Diagnosis not present

## 2020-07-31 DIAGNOSIS — M1990 Unspecified osteoarthritis, unspecified site: Secondary | ICD-10-CM | POA: Diagnosis not present

## 2020-07-31 DIAGNOSIS — E1165 Type 2 diabetes mellitus with hyperglycemia: Secondary | ICD-10-CM | POA: Diagnosis not present

## 2020-07-31 DIAGNOSIS — I10 Essential (primary) hypertension: Secondary | ICD-10-CM | POA: Diagnosis not present

## 2020-08-01 DIAGNOSIS — E1165 Type 2 diabetes mellitus with hyperglycemia: Secondary | ICD-10-CM | POA: Diagnosis not present

## 2020-08-01 DIAGNOSIS — I10 Essential (primary) hypertension: Secondary | ICD-10-CM | POA: Diagnosis not present

## 2020-08-01 DIAGNOSIS — I69322 Dysarthria following cerebral infarction: Secondary | ICD-10-CM | POA: Diagnosis not present

## 2020-08-01 DIAGNOSIS — M1990 Unspecified osteoarthritis, unspecified site: Secondary | ICD-10-CM | POA: Diagnosis not present

## 2020-08-01 DIAGNOSIS — M109 Gout, unspecified: Secondary | ICD-10-CM | POA: Diagnosis not present

## 2020-08-01 DIAGNOSIS — I69351 Hemiplegia and hemiparesis following cerebral infarction affecting right dominant side: Secondary | ICD-10-CM | POA: Diagnosis not present

## 2020-08-02 ENCOUNTER — Encounter: Payer: Self-pay | Admitting: Family Medicine

## 2020-08-02 ENCOUNTER — Ambulatory Visit (INDEPENDENT_AMBULATORY_CARE_PROVIDER_SITE_OTHER): Payer: Medicare Other | Admitting: Family Medicine

## 2020-08-02 ENCOUNTER — Other Ambulatory Visit: Payer: Self-pay

## 2020-08-02 VITALS — BP 148/71 | HR 80 | Temp 97.5°F | Resp 18 | Ht 70.0 in

## 2020-08-02 DIAGNOSIS — I6529 Occlusion and stenosis of unspecified carotid artery: Secondary | ICD-10-CM | POA: Diagnosis not present

## 2020-08-02 DIAGNOSIS — I69322 Dysarthria following cerebral infarction: Secondary | ICD-10-CM | POA: Diagnosis not present

## 2020-08-02 DIAGNOSIS — I700 Atherosclerosis of aorta: Secondary | ICD-10-CM

## 2020-08-02 DIAGNOSIS — I10 Essential (primary) hypertension: Secondary | ICD-10-CM

## 2020-08-02 DIAGNOSIS — G8191 Hemiplegia, unspecified affecting right dominant side: Secondary | ICD-10-CM

## 2020-08-02 DIAGNOSIS — C8333 Diffuse large B-cell lymphoma, intra-abdominal lymph nodes: Secondary | ICD-10-CM

## 2020-08-02 DIAGNOSIS — E119 Type 2 diabetes mellitus without complications: Secondary | ICD-10-CM

## 2020-08-02 DIAGNOSIS — I639 Cerebral infarction, unspecified: Secondary | ICD-10-CM | POA: Diagnosis not present

## 2020-08-02 DIAGNOSIS — E1165 Type 2 diabetes mellitus with hyperglycemia: Secondary | ICD-10-CM | POA: Diagnosis not present

## 2020-08-02 DIAGNOSIS — I69351 Hemiplegia and hemiparesis following cerebral infarction affecting right dominant side: Secondary | ICD-10-CM | POA: Diagnosis not present

## 2020-08-02 DIAGNOSIS — Z23 Encounter for immunization: Secondary | ICD-10-CM | POA: Diagnosis not present

## 2020-08-02 DIAGNOSIS — M109 Gout, unspecified: Secondary | ICD-10-CM | POA: Diagnosis not present

## 2020-08-02 DIAGNOSIS — M1990 Unspecified osteoarthritis, unspecified site: Secondary | ICD-10-CM | POA: Diagnosis not present

## 2020-08-02 MED ORDER — GLIMEPIRIDE 2 MG PO TABS
2.0000 mg | ORAL_TABLET | Freq: Every day | ORAL | 0 refills | Status: DC
Start: 2020-08-02 — End: 2020-09-04

## 2020-08-02 MED ORDER — CLONIDINE HCL 0.1 MG PO TABS
0.1000 mg | ORAL_TABLET | Freq: Two times a day (BID) | ORAL | 11 refills | Status: DC
Start: 2020-08-02 — End: 2021-08-22

## 2020-08-02 MED ORDER — VITAMIN B-12 1000 MCG PO TABS
1000.0000 ug | ORAL_TABLET | Freq: Every day | ORAL | 0 refills | Status: DC
Start: 2020-08-02 — End: 2020-09-04

## 2020-08-02 NOTE — Progress Notes (Signed)
I,April Miller,acting as a scribe for Wilhemena Durie, MD.,have documented all relevant documentation on the behalf of Wilhemena Durie, MD,as directed by  Wilhemena Durie, MD while in the presence of Wilhemena Durie, MD.  Established patient visit   Patient: Peter Ryan   DOB: 1950/10/01   69 y.o. Male  MRN: [id number] Visit Date: 08/02/2020  Today's healthcare provider: Wilhemena Durie, MD   Chief Complaint  Patient presents with  . Diabetes  . Follow-up  . Hypertension   Subjective    HPI  Patient comes in today for follow-up after CVA with right hemiparesis. He went to rehab at [facility]. He is back at home. His wife of 25 years is taking care of him. His speech is normal and swallow is not a problem, he still is in a wheelchair but is getting physical therapy and Occupational Therapy is getting stronger. Overall he is in fairly good spirits. He is determined to continue to improve. Diabetes Mellitus Type II, follow-up  Lab Results  Component Value Date   HGBA1C 7.5 (H) 06/06/2020   HGBA1C 7.6 (H) 05/09/2020   HGBA1C 8.4 (H) 11/22/2019   Last seen for diabetes 2 months ago.  Management since then includes; continue medications. To soon for A1c. He reports good compliance with treatment. He is not having side effects. none  Home blood sugar records: fasting range: 120-130  Episodes of hypoglycemia? No none   Current insulin regiment: n/a Most Recent Eye Exam: 01/10/2020  --------------------------------------------------------------------  Hypertension, follow-up  BP Readings from Last 3 Encounters:  08/02/20 (!) 148/71  07/20/20 131/77  07/11/20 (!) 143/68   Wt Readings from Last 3 Encounters:  07/20/20 271 lb 12.8 oz (123.3 kg)  06/14/20 284 lb 15.8 oz (129.3 kg)  06/09/20 285 lb (129.3 kg)     He was last seen for hypertension 3 months ago.  BP at that visit was 138/74. Management since that visit includes; on amlodipine and  losartan. He reports good compliance with treatment. He is not having side effects. none He is not exercising. He is not adherent to low salt diet.   Outside blood pressures are 130/80.  He does not smoke.  Use of agents associated with hypertension: none.   -------------------------------------------------------------------- Blood pressures at home  ran 130 this morning systolic  Obesity, morbid (more than 100 lbs over ideal weight or BMI > 40) (HCC) From 05/02/2020-Diet and exercise with needed weight loss discussed again.      Medications: Outpatient Medications Prior to Visit  Medication Sig  . acetaminophen (TYLENOL) 325 MG tablet Take 2 tablets (650 mg total) by mouth every 4 (four) hours as needed for mild pain (or temp > 37.5 C (99.5 F)).  Marland Kitchen allopurinol (ZYLOPRIM) 300 MG tablet Take 1 tablet (300 mg total) by mouth daily.  Marland Kitchen amLODipine (NORVASC) 10 MG tablet Take 1 tablet (10 mg total) by mouth daily.  Marland Kitchen aspirin EC 325 MG EC tablet Take 1 tablet (325 mg total) by mouth daily.  Marland Kitchen atorvastatin (LIPITOR) 80 MG tablet Take 1 tablet (80 mg total) by mouth daily.  . Cholecalciferol (VITAMIN D3) 25 MCG (1000 UT) CAPS Take 1 capsule (1,000 Units total) by mouth daily.  . cloNIDine (CATAPRES) 0.1 MG tablet Take 1 tablet (0.1 mg total) by mouth 2 (two) times daily.  . finasteride (PROSCAR) 5 MG tablet Take 1 tablet (5 mg total) by mouth daily.  Marland Kitchen glimepiride (AMARYL) 2 MG tablet Take 1 tablet (  2 mg total) by mouth daily with breakfast.  . hydrocortisone 2.5 % lotion SMARTSIG:Sparingly Topical 3 Times a Week  . losartan (COZAAR) 100 MG tablet TAKE ONE TABLET EVERY DAY BY MOUTH  . polyethylene glycol (MIRALAX / GLYCOLAX) 17 g packet Take 17 g by mouth daily.  . vitamin B-12 (CYANOCOBALAMIN) 1000 MCG tablet Take 1 tablet (1,000 mcg total) by mouth daily.   Facility-Administered Medications Prior to Visit  Medication Dose Route Frequency Provider  . sodium chloride flush (NS) 0.9 %  injection 10 mL  10 mL Intravenous PRN Sindy Guadeloupe, MD    Review of Systems  Constitutional: Negative for appetite change, chills and fever.  Respiratory: Negative for chest tightness, shortness of breath and wheezing.   Cardiovascular: Negative for chest pain and palpitations.  Gastrointestinal: Negative for abdominal pain, nausea and vomiting.       Objective    BP (!) 148/71 (BP Location: Left Arm, Patient Position: Sitting, Cuff Size: Large)   Pulse 80   Temp (!) 97.5 F (36.4 C) (Oral)   Resp 18   Ht 5\' 10"  (1.778 m)   SpO2 99%   BMI 39.00 kg/m  BP Readings from Last 3 Encounters:  08/02/20 (!) 148/71  07/20/20 131/77  07/11/20 (!) 143/68   Wt Readings from Last 3 Encounters:  07/20/20 271 lb 12.8 oz (123.3 kg)  06/14/20 284 lb 15.8 oz (129.3 kg)  06/09/20 285 lb (129.3 kg)      Physical Exam Vitals reviewed.  Constitutional:      Appearance: He is obese.  HENT:     Head: Normocephalic and atraumatic.     Right Ear: Tympanic membrane, ear canal and external ear normal.     Left Ear: Tympanic membrane, ear canal and external ear normal.     Nose: Nose normal.     Mouth/Throat:     Mouth: Mucous membranes are moist.     Pharynx: Oropharynx is clear.  Eyes:     Extraocular Movements: Extraocular movements intact.     Conjunctiva/sclera: Conjunctivae normal.     Pupils: Pupils are equal, round, and reactive to light.  Cardiovascular:     Rate and Rhythm: Normal rate and regular rhythm.     Pulses: Normal pulses.     Heart sounds: Normal heart sounds.  Pulmonary:     Effort: Pulmonary effort is normal.     Breath sounds: Normal breath sounds.  Abdominal:     General: Bowel sounds are normal.     Palpations: Abdomen is soft.  Genitourinary:    Penis: Normal.      Testes: Normal.     Comments: Deferred to urology Musculoskeletal:     Cervical back: Normal range of motion and neck supple.     Comments: Trace to 1+ lower extremity edema  Skin:     General: Skin is warm and dry.  Neurological:     Mental Status: He is alert and oriented to person, place, and time.     Coordination: Coordination abnormal.     Comments: 5-/5+ strength in the right upper extremity. He is sitting in a wheelchair and I did not attempt to ambulate him today. Neurologic exam otherwise normal  Psychiatric:        Mood and Affect: Mood normal.        Behavior: Behavior normal.        Thought Content: Thought content normal.        Judgment: Judgment normal.  No results found for any visits on 08/02/20.  Assessment & Plan     1. Left pontine cerebrovascular accident ([place])   2. Acute CVA (cerebrovascular accident) ([place])   3. Right hemiparesis ([place]) Clinically improving after rehab. Continue physical therapy and Occupational Therapy.  4. Need for influenza vaccination  - Flu Vaccine QUAD High Dose(Fluad)  5. Type 2 diabetes mellitus without complication, without long-term current use of insulin ([place]) Encourage patient to continue to work on weight loss.  6. Benign essential hypertension   7. Atherosclerosis of aorta ([place]) All risk factors treated  8. 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 [facility]) Per oncology  9. Morbid obesity ([place]) Discussed at length as above. Any weight loss will help patient's quality of life.    No follow-ups on file.      I, Wilhemena Durie, MD, have reviewed all documentation for this visit. The documentation on 08/07/20 for the exam, diagnosis, procedures, and orders are all accurate and complete.    Terrie Haring Cranford Mon, MD  [facility] [phone] (phone) [phone] (fax)  [place]

## 2020-08-03 DIAGNOSIS — I69322 Dysarthria following cerebral infarction: Secondary | ICD-10-CM | POA: Diagnosis not present

## 2020-08-03 DIAGNOSIS — I10 Essential (primary) hypertension: Secondary | ICD-10-CM | POA: Diagnosis not present

## 2020-08-03 DIAGNOSIS — I69351 Hemiplegia and hemiparesis following cerebral infarction affecting right dominant side: Secondary | ICD-10-CM | POA: Diagnosis not present

## 2020-08-03 DIAGNOSIS — M1990 Unspecified osteoarthritis, unspecified site: Secondary | ICD-10-CM | POA: Diagnosis not present

## 2020-08-03 DIAGNOSIS — M109 Gout, unspecified: Secondary | ICD-10-CM | POA: Diagnosis not present

## 2020-08-03 DIAGNOSIS — E1165 Type 2 diabetes mellitus with hyperglycemia: Secondary | ICD-10-CM | POA: Diagnosis not present

## 2020-08-03 NOTE — Telephone Encounter (Signed)
done

## 2020-08-06 ENCOUNTER — Other Ambulatory Visit: Payer: Self-pay | Admitting: Family Medicine

## 2020-08-06 NOTE — Telephone Encounter (Signed)
Notes to clinic: medication was filled by a different provider    Requested Prescriptions  Pending Prescriptions Disp Refills   atorvastatin (LIPITOR) 80 MG tablet [Pharmacy Med Name: ATORVASTATIN CALCIUM 80 MG TAB] 30 tablet 0    Sig: TAKE ONE TABLET (80 MG) BY MOUTH EVERY DAY      Cardiovascular:  Antilipid - Statins Failed - 08/06/2020 10:17 AM      Failed - HDL in normal range and within 360 days    HDL  Date Value Ref Range Status  06/06/2020 29 (L) >40 mg/dL Final  05/09/2020 30 (L) >39 mg/dL Final          Failed - Triglycerides in normal range and within 360 days    Triglycerides  Date Value Ref Range Status  06/06/2020 321 (H) <150 mg/dL Final          Passed - Total Cholesterol in normal range and within 360 days    Cholesterol, Total  Date Value Ref Range Status  05/09/2020 207 (H) 100 - 199 mg/dL Final   Cholesterol  Date Value Ref Range Status  06/06/2020 170 0 - 200 mg/dL Final          Passed - LDL in normal range and within 360 days    LDL Chol Calc (NIH)  Date Value Ref Range Status  05/09/2020 94 0 - 99 mg/dL Final   LDL Cholesterol  Date Value Ref Range Status  06/06/2020 77 0 - 99 mg/dL Final    Comment:           Total Cholesterol/HDL:CHD Risk Coronary Heart Disease Risk Table                     Men   Women  1/2 Average Risk   3.4   3.3  Average Risk       5.0   4.4  2 X Average Risk   9.6   7.1  3 X Average Risk  23.4   11.0        Use the calculated Patient Ratio above and the CHD Risk Table to determine the patient's CHD Risk.        ATP III CLASSIFICATION (LDL):  <100     mg/dL   Optimal  100-129  mg/dL   Near or Above                    Optimal  130-159  mg/dL   Borderline  160-189  mg/dL   High  >190     mg/dL   Very High Performed at [facility], [address], [place], [place] [address]           Passed - Patient is not pregnant      Passed - Valid encounter within last 12 months    Recent Outpatient  Visits           4 days ago Type 2 diabetes mellitus without complication, without long-term current use of insulin [facility])   [facility] [name]., MD   3 months ago Obesity, morbid (more than 100 lbs over ideal weight or BMI > 40) [facility])   [facility] [name]., MD   8 months ago Type 2 diabetes mellitus with complication, with long-term current use of insulin [facility])   [facility] [name]., MD   1 year ago Plantar fasciitis   [facility] Miguel Aschoff  Kaylyn Lim., MD   2 years ago Type 2 diabetes mellitus with complication, with long-term current use of insulin [facility])   [facility] [name]., MD       Future Appointments             In 1 month McGowan, Gordan Payment [place]   In 1 month [name]., MD [facility], [facility]

## 2020-08-07 DIAGNOSIS — E1165 Type 2 diabetes mellitus with hyperglycemia: Secondary | ICD-10-CM | POA: Diagnosis not present

## 2020-08-07 DIAGNOSIS — I69322 Dysarthria following cerebral infarction: Secondary | ICD-10-CM | POA: Diagnosis not present

## 2020-08-07 DIAGNOSIS — M1990 Unspecified osteoarthritis, unspecified site: Secondary | ICD-10-CM | POA: Diagnosis not present

## 2020-08-07 DIAGNOSIS — M109 Gout, unspecified: Secondary | ICD-10-CM | POA: Diagnosis not present

## 2020-08-07 DIAGNOSIS — I10 Essential (primary) hypertension: Secondary | ICD-10-CM | POA: Diagnosis not present

## 2020-08-07 DIAGNOSIS — I69351 Hemiplegia and hemiparesis following cerebral infarction affecting right dominant side: Secondary | ICD-10-CM | POA: Diagnosis not present

## 2020-08-08 ENCOUNTER — Other Ambulatory Visit: Payer: Self-pay | Admitting: Family Medicine

## 2020-08-08 DIAGNOSIS — I10 Essential (primary) hypertension: Secondary | ICD-10-CM | POA: Diagnosis not present

## 2020-08-08 DIAGNOSIS — M109 Gout, unspecified: Secondary | ICD-10-CM | POA: Diagnosis not present

## 2020-08-08 DIAGNOSIS — I69351 Hemiplegia and hemiparesis following cerebral infarction affecting right dominant side: Secondary | ICD-10-CM | POA: Diagnosis not present

## 2020-08-08 DIAGNOSIS — E1165 Type 2 diabetes mellitus with hyperglycemia: Secondary | ICD-10-CM | POA: Diagnosis not present

## 2020-08-08 DIAGNOSIS — M1990 Unspecified osteoarthritis, unspecified site: Secondary | ICD-10-CM | POA: Diagnosis not present

## 2020-08-08 DIAGNOSIS — I69322 Dysarthria following cerebral infarction: Secondary | ICD-10-CM | POA: Diagnosis not present

## 2020-08-09 ENCOUNTER — Ambulatory Visit: Payer: Medicare Other | Admitting: Gastroenterology

## 2020-08-09 DIAGNOSIS — I10 Essential (primary) hypertension: Secondary | ICD-10-CM | POA: Diagnosis not present

## 2020-08-09 DIAGNOSIS — I69322 Dysarthria following cerebral infarction: Secondary | ICD-10-CM | POA: Diagnosis not present

## 2020-08-09 DIAGNOSIS — M1990 Unspecified osteoarthritis, unspecified site: Secondary | ICD-10-CM | POA: Diagnosis not present

## 2020-08-09 DIAGNOSIS — I69351 Hemiplegia and hemiparesis following cerebral infarction affecting right dominant side: Secondary | ICD-10-CM | POA: Diagnosis not present

## 2020-08-09 DIAGNOSIS — E1165 Type 2 diabetes mellitus with hyperglycemia: Secondary | ICD-10-CM | POA: Diagnosis not present

## 2020-08-09 DIAGNOSIS — M109 Gout, unspecified: Secondary | ICD-10-CM | POA: Diagnosis not present

## 2020-08-11 ENCOUNTER — Other Ambulatory Visit: Payer: Self-pay

## 2020-08-11 ENCOUNTER — Encounter: Payer: Self-pay | Admitting: Neurology

## 2020-08-11 ENCOUNTER — Ambulatory Visit (INDEPENDENT_AMBULATORY_CARE_PROVIDER_SITE_OTHER): Payer: Medicare Other | Admitting: Neurology

## 2020-08-11 VITALS — BP 134/77 | HR 72 | Ht 70.0 in | Wt 270.0 lb

## 2020-08-11 DIAGNOSIS — I639 Cerebral infarction, unspecified: Secondary | ICD-10-CM | POA: Diagnosis not present

## 2020-08-11 DIAGNOSIS — G603 Idiopathic progressive neuropathy: Secondary | ICD-10-CM

## 2020-08-11 DIAGNOSIS — I6529 Occlusion and stenosis of unspecified carotid artery: Secondary | ICD-10-CM | POA: Diagnosis not present

## 2020-08-11 DIAGNOSIS — E538 Deficiency of other specified B group vitamins: Secondary | ICD-10-CM

## 2020-08-11 NOTE — Progress Notes (Signed)
Reason for visit: Stroke  Referring physician: [facility]  Peter Ryan is a 69 y.o. male  History of present illness:  Peter Ryan is a 69 year old right-handed white male with a history of diabetes, hypertension, obesity, and dyslipidemia.  The patient also has a significant peripheral neuropathy in part related to his diabetes, but the patient has a history of non-Hodgkin's lymphoma and received chemotherapy including cisplatin 4 years ago.  The patient indicates that he believes his neuropathy got fairly significant at that point.  He was having trouble with balance and ambulation prior to the stroke event that occurred on 05 June 2020.  The patient was admitted to the hospital with a right hemiparesis and was noted to have a left pontine infarct and a small right frontal infarct.  There is evidence of fairly extensive chronic small vessel disease as well.  The patient underwent a work-up that included a CT angiogram of the head and neck revealed some intracranial atherosclerosis of the left M2 segment, the patient underwent a 2D echocardiogram and a transesophageal echocardiogram that were unrevealing.  He was to be set up for a cardiac monitor study as an outpatient, but the wife canceled the appointment.  The patient has gotten inpatient rehab and then has returned home with home health physical and occupational therapy.  The patient is walking a few steps with a walker, but he is not walking independently.  The has not had any falls.  He reports weakness on the right arm and leg, he denies any numbness.  He has not had any vision changes.  He does have some mild slurred speech, he will choke on water at times but otherwise he is on a regular diet.  He is not having any pain.  His LDL cholesterol in the hospital was 77, he is on Lipitor.  His hemoglobin A1c is 7.5.  He does not smoke cigarettes.  He comes to this office for an evaluation.  Past Medical History:  Diagnosis Date  . Arthritis    . BPH (benign prostatic hyperplasia)   . Cancer ([place])    Non-Hodgkins lymphoma  . Chronic prostatitis   . Cirrhosis of liver ([place])   . Diabetes mellitus without complication ([place])   . Dyslipidemia   . Elevated PSA   . Gastric reflux   . Gout   . HTN (hypertension)   . Neuropathy   . Over weight   . Psoriasis   . Testicular pain, right   . Urinary frequency     Past Surgical History:  Procedure Laterality Date  . hydrocelectomy    . IR FLUORO GUIDE PORT INSERTION RIGHT  04/11/2017  . IR REMOVAL TUN ACCESS W/ PORT W/O FL MOD SED  05/29/2018  . PILONIDAL CYST EXCISION    . TEE WITHOUT CARDIOVERSION N/A 06/09/2020   Procedure: TRANSESOPHAGEAL ECHOCARDIOGRAM (TEE);  Surgeon: Minna Merritts, MD;  Location: [facility] ORS;  Service: Cardiovascular;  Laterality: N/A;    Family History  Problem Relation Age of Onset  . Uterine cancer Mother   . Diabetes Mother   . Cancer Mother   . Cancer Maternal Uncle   . Bladder Cancer Neg Hx   . Kidney disease Neg Hx   . Prostate cancer Neg Hx     Social history:  reports that he quit smoking about 46 years ago. His smoking use included cigarettes. He has a 12.00 pack-year smoking history. He has never used smokeless tobacco. He reports that he does not drink  alcohol and does not use drugs.  Medications:  Prior to Admission medications   Medication Sig Start Date End Date Taking? Authorizing Provider  acetaminophen (TYLENOL) 325 MG tablet Take 2 tablets (650 mg total) by mouth every 4 (four) hours as needed for mild pain (or temp > 37.5 C (99.5 F)). 07/10/20  Yes Angiulli, Lavon Paganini, PA-C  allopurinol (ZYLOPRIM) 300 MG tablet Take 1 tablet (300 mg total) by mouth daily. 07/10/20  Yes Angiulli, Lavon Paganini, PA-C  amLODipine (NORVASC) 10 MG tablet Take 1 tablet (10 mg total) by mouth daily. 07/10/20  Yes Angiulli, Lavon Paganini, PA-C  aspirin EC 325 MG EC tablet Take 1 tablet (325 mg total) by mouth daily. 06/15/20  Yes Sreenath, Sudheer B, MD    atorvastatin (LIPITOR) 80 MG tablet TAKE ONE TABLET (80 MG) BY MOUTH EVERY DAY 08/08/20  Yes [name]., MD  Cholecalciferol (VITAMIN D3) 25 MCG (1000 UT) CAPS Take 1 capsule (1,000 Units total) by mouth daily. 07/10/20  Yes Angiulli, Lavon Paganini, PA-C  cloNIDine (CATAPRES) 0.1 MG tablet Take 1 tablet (0.1 mg total) by mouth 2 (two) times daily. 08/02/20  Yes [name]., MD  finasteride (PROSCAR) 5 MG tablet Take 1 tablet (5 mg total) by mouth daily. 07/11/20  Yes Angiulli, Lavon Paganini, PA-C  glimepiride (AMARYL) 2 MG tablet Take 1 tablet (2 mg total) by mouth daily with breakfast. 08/02/20  Yes [name]., MD  hydrocortisone 2.5 % lotion SMARTSIG:Sparingly Topical 3 Times a Week 07/31/20  Yes [provider]  losartan (COZAAR) 100 MG tablet TAKE ONE TABLET EVERY DAY BY MOUTH 07/10/20  Yes Angiulli, Lavon Paganini, PA-C  polyethylene glycol (MIRALAX / GLYCOLAX) 17 g packet Take 17 g by mouth daily. 07/11/20  Yes Angiulli, Lavon Paganini, PA-C  vitamin B-12 (CYANOCOBALAMIN) 1000 MCG tablet Take 1 tablet (1,000 mcg total) by mouth daily. 08/02/20  Yes [name]., MD      Allergies  Allergen Reactions  . Hctz [Hydrochlorothiazide] Other (See Comments)    Gout flare  . Metformin Other (See Comments)    Stomach upset     ROS:  Out of a complete 14 system review of symptoms, the patient complains only of the following symptoms, and all other reviewed systems are negative.  Weakness Difficulty walking Numbness in the feet  Blood pressure 134/77, pulse 72, height 5\' 10"  (1.778 m), weight 270 lb (122.5 kg).  Physical Exam  General: The patient is alert and cooperative at the time of the examination.  The patient is markedly obese.  Eyes: Pupils are equal, round, and reactive to light. Discs are flat bilaterally.  Neck: The neck is supple, no carotid bruits are noted.  Respiratory: The respiratory examination is clear.  Cardiovascular: The  cardiovascular examination reveals a regular rate and rhythm, no obvious murmurs or rubs are noted.  Skin: Extremities are without significant edema.  Neurologic Exam  Mental status: The patient is alert and oriented x 3 at the time of the examination. The patient has apparent normal recent and remote memory, with an apparently normal attention span and concentration ability.  Cranial nerves: Facial symmetry is present. There is good sensation of the face to pinprick and soft touch bilaterally. The strength of the facial muscles and the muscles to head turning and shoulder shrug are normal bilaterally. Speech is minimally dysarthric, not aphasic. Extraocular movements are full. Visual fields are full. The tongue is midline, and the patient has symmetric  elevation of the soft palate. No obvious hearing deficits are noted.  Motor: The motor testing reveals 5 over 5 strength of the left extremities.  Patient has 4/5 strength with the right deltoid, otherwise has good distal strength.  The patient has 4/5 strength with hip flexion on the right, good distal strength is seen.  Rapid alternating movements with the right arm are decreased.  Good symmetric motor tone is noted on the left, increased on the right.  Sensory: Sensory testing is intact to pinprick, soft touch, vibration sensation, and position sense on the upper extremities.  With the lower extremities, the patient has a pinprick sensory deficit up to the knees bilaterally with marked impairment of vibration and position sense in both feet.  No evidence of extinction is noted.  Coordination: Cerebellar testing reveals good finger-nose-finger and heel-to-shin on the left, the patient has slowness of movement with finger-nose-finger on the right arm and some difficulty with heel-to-shin on the right leg.  Gait and station: The patient can stand with assistance, when standing, he has tendency to go backwards, he has a wide-based stance.  He was not  ambulated.  Reflexes: Deep tendon reflexes are elevated in the right biceps and right knee jerk reflexes compared to the left, ankle jerk reflexes are depressed bilaterally.  Toes are downgoing bilaterally.   MRI brain 06/05/20:  IMPRESSION: 1. Acute left pontine and right frontal white matter infarcts. 2. Severe chronic small vessel ischemic disease.  * MRI scan images were reviewed online. I agree with the written report.   CTA head and neck 06/05/20:  IMPRESSION: 1. No large vessel occlusion. 2. Intracranial atherosclerosis including severe stenoses of a left M2 branch vessel and of the median artery of the corpus callosum. 3. Strongly dominant right vertebral artery with possible moderate proximal V1 stenosis. 4. Hypoplastic left vertebral artery with a severe distal V4 stenosis. 5. Widely patent cervical carotid arteries.   2D echo 06/05/20:  IMPRESSIONS    1. Left ventricular ejection fraction, by estimation, is >55%. The left  ventricle has normal function. Left ventricular endocardial border not  optimally defined to evaluate regional wall motion. There is mild left  ventricular hypertrophy. Left  ventricular diastolic parameters are consistent with Grade I diastolic  dysfunction (impaired relaxation).  2. Right ventricular systolic function is normal. The right ventricular  size is normal.  3. The mitral valve is grossly normal. No evidence of mitral valve  regurgitation.  4. The aortic valve was not well visualized. Aortic valve regurgitation  is not visualized. No aortic stenosis is present.    TEE 06/09/20:  IMPRESSIONS    1. Left ventricular ejection fraction, by estimation, is 55 %. The left  ventricle has normal function. The left ventricle has no regional wall  motion abnormalities.  2. Right ventricular systolic function is normal. The right ventricular  size is normal.  3. No left atrial/left atrial appendage thrombus was detected.  4.  Negative saline contrast bubble study, unable to test with valsalva.   Conclusion(s)/Recommendation(s): Normal biventricular function without  evidence of hemodynamically significant valvular heart disease.   FINDINGS  Left Ventricle: Left ventricular ejection fraction, by estimation, is 55  to 60%. The left ventricle has normal function. The left ventricle has no  regional wall motion abnormalities. The left ventricular internal cavity  size was normal in size. There is  no left ventricular hypertrophy.   Right Ventricle: The right ventricular size is normal. No increase in  right ventricular wall thickness.  Right ventricular systolic function is  normal.   Left Atrium: Left atrial size was normal in size. No left atrial/left  atrial appendage thrombus was detected.   Right Atrium: Right atrial size was normal in size.   Pericardium: There is no evidence of pericardial effusion.   Mitral Valve: The mitral valve is normal in structure. No evidence of  mitral valve regurgitation. No evidence of mitral valve stenosis.   Tricuspid Valve: The tricuspid valve is normal in structure. Tricuspid  valve regurgitation is not demonstrated. No evidence of tricuspid  stenosis.   Aortic Valve: The aortic valve is normal in structure. Aortic valve  regurgitation is not visualized. No aortic stenosis is present.   Pulmonic Valve: The pulmonic valve was normal in structure. Pulmonic valve  regurgitation is not visualized. No evidence of pulmonic stenosis.   Aorta: The aortic root is normal in size and structure.   Venous: The inferior vena cava is normal in size with greater than 50%  respiratory variability, suggesting right atrial pressure of 3 mmHg.   IAS/Shunts: No atrial level shunt detected by color flow Doppler. Agitated  saline contrast was given intravenously to evaluate for intracardiac  shunting. There is no evidence of an atrial septal defect.     Assessment/Plan:  1.   Recent left pontine stroke, right hemiparesis  2.  Extensive chronic small vessel disease by MRI  3.  Gait disorder  4.  Peripheral neuropathy  The patient has a significant peripheral neuropathy that is adding to the issue with his ability to ambulate independently.  We will check some blood work in this regard today.  The patient does have a history of diabetes but also has a history of chemotherapy for non-Hodgkin's lymphoma.  The patient will continue physical therapy and Occupational Therapy.  I have recommended that his wife call back and get the appointment for the cardiology office to get a monitor study set up.  The patient will follow up here in 4 months.  His systolic blood pressure should be maintained below 840 systolic, his LDL should be maintained at or below 70.  He is on aspirin therapy currently.  Jill Alexanders MD 08/11/2020 10:01 AM  [facility] [address] [place], [place] [address]  Phone [phone] Fax [phone]

## 2020-08-12 DIAGNOSIS — M1990 Unspecified osteoarthritis, unspecified site: Secondary | ICD-10-CM | POA: Diagnosis not present

## 2020-08-12 DIAGNOSIS — I69322 Dysarthria following cerebral infarction: Secondary | ICD-10-CM | POA: Diagnosis not present

## 2020-08-12 DIAGNOSIS — E1165 Type 2 diabetes mellitus with hyperglycemia: Secondary | ICD-10-CM | POA: Diagnosis not present

## 2020-08-12 DIAGNOSIS — I69351 Hemiplegia and hemiparesis following cerebral infarction affecting right dominant side: Secondary | ICD-10-CM | POA: Diagnosis not present

## 2020-08-12 DIAGNOSIS — I10 Essential (primary) hypertension: Secondary | ICD-10-CM | POA: Diagnosis not present

## 2020-08-12 DIAGNOSIS — M109 Gout, unspecified: Secondary | ICD-10-CM | POA: Diagnosis not present

## 2020-08-14 DIAGNOSIS — I69322 Dysarthria following cerebral infarction: Secondary | ICD-10-CM | POA: Diagnosis not present

## 2020-08-14 DIAGNOSIS — Z7984 Long term (current) use of oral hypoglycemic drugs: Secondary | ICD-10-CM | POA: Diagnosis not present

## 2020-08-14 DIAGNOSIS — I10 Essential (primary) hypertension: Secondary | ICD-10-CM | POA: Diagnosis not present

## 2020-08-14 DIAGNOSIS — G629 Polyneuropathy, unspecified: Secondary | ICD-10-CM | POA: Diagnosis not present

## 2020-08-14 DIAGNOSIS — N400 Enlarged prostate without lower urinary tract symptoms: Secondary | ICD-10-CM | POA: Diagnosis not present

## 2020-08-14 DIAGNOSIS — Z9181 History of falling: Secondary | ICD-10-CM | POA: Diagnosis not present

## 2020-08-14 DIAGNOSIS — K7460 Unspecified cirrhosis of liver: Secondary | ICD-10-CM | POA: Diagnosis not present

## 2020-08-14 DIAGNOSIS — Z87891 Personal history of nicotine dependence: Secondary | ICD-10-CM | POA: Diagnosis not present

## 2020-08-14 DIAGNOSIS — E785 Hyperlipidemia, unspecified: Secondary | ICD-10-CM | POA: Diagnosis not present

## 2020-08-14 DIAGNOSIS — K219 Gastro-esophageal reflux disease without esophagitis: Secondary | ICD-10-CM | POA: Diagnosis not present

## 2020-08-14 DIAGNOSIS — Z7982 Long term (current) use of aspirin: Secondary | ICD-10-CM | POA: Diagnosis not present

## 2020-08-14 DIAGNOSIS — I639 Cerebral infarction, unspecified: Secondary | ICD-10-CM | POA: Diagnosis not present

## 2020-08-14 DIAGNOSIS — I69351 Hemiplegia and hemiparesis following cerebral infarction affecting right dominant side: Secondary | ICD-10-CM | POA: Diagnosis not present

## 2020-08-14 DIAGNOSIS — C8590 Non-Hodgkin lymphoma, unspecified, unspecified site: Secondary | ICD-10-CM | POA: Diagnosis not present

## 2020-08-14 DIAGNOSIS — M1990 Unspecified osteoarthritis, unspecified site: Secondary | ICD-10-CM | POA: Diagnosis not present

## 2020-08-14 DIAGNOSIS — E1165 Type 2 diabetes mellitus with hyperglycemia: Secondary | ICD-10-CM | POA: Diagnosis not present

## 2020-08-14 DIAGNOSIS — M109 Gout, unspecified: Secondary | ICD-10-CM | POA: Diagnosis not present

## 2020-08-14 DIAGNOSIS — Z6838 Body mass index (BMI) 38.0-38.9, adult: Secondary | ICD-10-CM | POA: Diagnosis not present

## 2020-08-15 DIAGNOSIS — M1990 Unspecified osteoarthritis, unspecified site: Secondary | ICD-10-CM | POA: Diagnosis not present

## 2020-08-15 DIAGNOSIS — M109 Gout, unspecified: Secondary | ICD-10-CM | POA: Diagnosis not present

## 2020-08-15 DIAGNOSIS — I69322 Dysarthria following cerebral infarction: Secondary | ICD-10-CM | POA: Diagnosis not present

## 2020-08-15 DIAGNOSIS — E1165 Type 2 diabetes mellitus with hyperglycemia: Secondary | ICD-10-CM | POA: Diagnosis not present

## 2020-08-15 DIAGNOSIS — I69351 Hemiplegia and hemiparesis following cerebral infarction affecting right dominant side: Secondary | ICD-10-CM | POA: Diagnosis not present

## 2020-08-15 DIAGNOSIS — I10 Essential (primary) hypertension: Secondary | ICD-10-CM | POA: Diagnosis not present

## 2020-08-16 ENCOUNTER — Telehealth: Payer: Self-pay | Admitting: Family Medicine

## 2020-08-16 ENCOUNTER — Telehealth: Payer: Self-pay | Admitting: Neurology

## 2020-08-16 DIAGNOSIS — I69351 Hemiplegia and hemiparesis following cerebral infarction affecting right dominant side: Secondary | ICD-10-CM | POA: Diagnosis not present

## 2020-08-16 DIAGNOSIS — I69322 Dysarthria following cerebral infarction: Secondary | ICD-10-CM | POA: Diagnosis not present

## 2020-08-16 DIAGNOSIS — M109 Gout, unspecified: Secondary | ICD-10-CM | POA: Diagnosis not present

## 2020-08-16 DIAGNOSIS — I10 Essential (primary) hypertension: Secondary | ICD-10-CM | POA: Diagnosis not present

## 2020-08-16 DIAGNOSIS — E1165 Type 2 diabetes mellitus with hyperglycemia: Secondary | ICD-10-CM | POA: Diagnosis not present

## 2020-08-16 DIAGNOSIS — M1990 Unspecified osteoarthritis, unspecified site: Secondary | ICD-10-CM | POA: Diagnosis not present

## 2020-08-16 LAB — MULTIPLE MYELOMA PANEL, SERUM
Albumin SerPl Elph-Mcnc: 3.4 g/dL (ref 2.9–4.4)
Albumin/Glob SerPl: 1 (ref 0.7–1.7)
Alpha 1: 0.3 g/dL (ref 0.0–0.4)
Alpha2 Glob SerPl Elph-Mcnc: 1 g/dL (ref 0.4–1.0)
B-Globulin SerPl Elph-Mcnc: 1.4 g/dL — ABNORMAL HIGH (ref 0.7–1.3)
Gamma Glob SerPl Elph-Mcnc: 0.9 g/dL (ref 0.4–1.8)
Globulin, Total: 3.5 g/dL (ref 2.2–3.9)
IgA/Immunoglobulin A, Serum: 447 mg/dL — ABNORMAL HIGH (ref 61–437)
IgG (Immunoglobin G), Serum: 879 mg/dL (ref 603–1613)
IgM (Immunoglobulin M), Srm: 85 mg/dL (ref 20–172)
M Protein SerPl Elph-Mcnc: 0.4 g/dL — ABNORMAL HIGH
Total Protein: 6.9 g/dL (ref 6.0–8.5)

## 2020-08-16 LAB — COPPER, SERUM: Copper: 127 ug/dL (ref 69–132)

## 2020-08-16 LAB — VITAMIN B12: Vitamin B-12: 762 pg/mL (ref 232–1245)

## 2020-08-16 LAB — ANA W/REFLEX: Anti Nuclear Antibody (ANA): NEGATIVE

## 2020-08-16 LAB — ANGIOTENSIN CONVERTING ENZYME: Angio Convert Enzyme: 15 U/L (ref 14–82)

## 2020-08-16 LAB — B. BURGDORFI ANTIBODIES: Lyme IgG/IgM Ab: <0.91 {ISR} (ref 0.00–0.90)

## 2020-08-16 NOTE — Telephone Encounter (Signed)
I called the patient.  The blood work for neuropathy was unremarkable with exception that the patient has a monoclonal antibody with an IgA antibody.  The patient is actively being followed by oncology for non-Hodgkin's lymphoma, the patient is to call make an appointment regarding this.  I will send his oncologist a note.

## 2020-08-16 NOTE — Telephone Encounter (Signed)
Colletta Maryland, OT with [facility], calling and is requesting a continuation of services. Please advise.   Frequency: 1x for 4wks.     [phone]

## 2020-08-16 NOTE — Telephone Encounter (Signed)
ok 

## 2020-08-16 NOTE — Telephone Encounter (Signed)
Please review. Thanks!  

## 2020-08-16 NOTE — Telephone Encounter (Signed)
Thank you for letting me know. IgA monoclonal protein is very low and likely not related to neuropathy. I will f/u with him.

## 2020-08-16 NOTE — Telephone Encounter (Signed)
L/M for Colletta Maryland advising below.

## 2020-08-21 ENCOUNTER — Telehealth: Payer: Self-pay | Admitting: *Deleted

## 2020-08-21 ENCOUNTER — Other Ambulatory Visit: Payer: Self-pay | Admitting: Family Medicine

## 2020-08-21 DIAGNOSIS — E1165 Type 2 diabetes mellitus with hyperglycemia: Secondary | ICD-10-CM | POA: Diagnosis not present

## 2020-08-21 DIAGNOSIS — I69351 Hemiplegia and hemiparesis following cerebral infarction affecting right dominant side: Secondary | ICD-10-CM | POA: Diagnosis not present

## 2020-08-21 DIAGNOSIS — M109 Gout, unspecified: Secondary | ICD-10-CM | POA: Diagnosis not present

## 2020-08-21 DIAGNOSIS — M1990 Unspecified osteoarthritis, unspecified site: Secondary | ICD-10-CM | POA: Diagnosis not present

## 2020-08-21 DIAGNOSIS — I10 Essential (primary) hypertension: Secondary | ICD-10-CM | POA: Diagnosis not present

## 2020-08-21 DIAGNOSIS — Z87898 Personal history of other specified conditions: Secondary | ICD-10-CM

## 2020-08-21 DIAGNOSIS — N138 Other obstructive and reflux uropathy: Secondary | ICD-10-CM

## 2020-08-21 DIAGNOSIS — I69322 Dysarthria following cerebral infarction: Secondary | ICD-10-CM | POA: Diagnosis not present

## 2020-08-21 NOTE — Telephone Encounter (Signed)
Please see attached message from Dr. Janese Banks.

## 2020-08-21 NOTE — Telephone Encounter (Signed)
In person visit with me next week or week after. No labs. Thank you

## 2020-08-21 NOTE — Telephone Encounter (Signed)
Patient wife [name] called reporting that patient had labs drawn at PCP that are concerning and she is asking to discuss with [name] or Dr Janese Banks and perhaps come in for more labs at our office

## 2020-08-21 NOTE — Telephone Encounter (Signed)
Spoke w/pt appt scheduled for 11/29

## 2020-08-22 DIAGNOSIS — E1165 Type 2 diabetes mellitus with hyperglycemia: Secondary | ICD-10-CM | POA: Diagnosis not present

## 2020-08-22 DIAGNOSIS — M1990 Unspecified osteoarthritis, unspecified site: Secondary | ICD-10-CM | POA: Diagnosis not present

## 2020-08-22 DIAGNOSIS — I69322 Dysarthria following cerebral infarction: Secondary | ICD-10-CM | POA: Diagnosis not present

## 2020-08-22 DIAGNOSIS — I69351 Hemiplegia and hemiparesis following cerebral infarction affecting right dominant side: Secondary | ICD-10-CM | POA: Diagnosis not present

## 2020-08-22 DIAGNOSIS — I10 Essential (primary) hypertension: Secondary | ICD-10-CM | POA: Diagnosis not present

## 2020-08-22 DIAGNOSIS — M109 Gout, unspecified: Secondary | ICD-10-CM | POA: Diagnosis not present

## 2020-08-23 DIAGNOSIS — I69351 Hemiplegia and hemiparesis following cerebral infarction affecting right dominant side: Secondary | ICD-10-CM | POA: Diagnosis not present

## 2020-08-23 DIAGNOSIS — I10 Essential (primary) hypertension: Secondary | ICD-10-CM | POA: Diagnosis not present

## 2020-08-23 DIAGNOSIS — E1165 Type 2 diabetes mellitus with hyperglycemia: Secondary | ICD-10-CM | POA: Diagnosis not present

## 2020-08-23 DIAGNOSIS — M109 Gout, unspecified: Secondary | ICD-10-CM | POA: Diagnosis not present

## 2020-08-23 DIAGNOSIS — M1990 Unspecified osteoarthritis, unspecified site: Secondary | ICD-10-CM | POA: Diagnosis not present

## 2020-08-23 DIAGNOSIS — I69322 Dysarthria following cerebral infarction: Secondary | ICD-10-CM | POA: Diagnosis not present

## 2020-08-28 ENCOUNTER — Encounter: Payer: Self-pay | Admitting: Oncology

## 2020-08-28 ENCOUNTER — Other Ambulatory Visit: Payer: Self-pay

## 2020-08-28 ENCOUNTER — Inpatient Hospital Stay: Payer: Medicare Other | Attending: Oncology | Admitting: Oncology

## 2020-08-28 VITALS — BP 157/84 | HR 82 | Temp 97.8°F | Resp 16

## 2020-08-28 DIAGNOSIS — I6529 Occlusion and stenosis of unspecified carotid artery: Secondary | ICD-10-CM | POA: Diagnosis not present

## 2020-08-28 DIAGNOSIS — M109 Gout, unspecified: Secondary | ICD-10-CM | POA: Diagnosis not present

## 2020-08-28 DIAGNOSIS — Z79899 Other long term (current) drug therapy: Secondary | ICD-10-CM | POA: Insufficient documentation

## 2020-08-28 DIAGNOSIS — E119 Type 2 diabetes mellitus without complications: Secondary | ICD-10-CM | POA: Diagnosis not present

## 2020-08-28 DIAGNOSIS — Z833 Family history of diabetes mellitus: Secondary | ICD-10-CM | POA: Diagnosis not present

## 2020-08-28 DIAGNOSIS — Z7982 Long term (current) use of aspirin: Secondary | ICD-10-CM | POA: Insufficient documentation

## 2020-08-28 DIAGNOSIS — M1990 Unspecified osteoarthritis, unspecified site: Secondary | ICD-10-CM | POA: Diagnosis not present

## 2020-08-28 DIAGNOSIS — Z7984 Long term (current) use of oral hypoglycemic drugs: Secondary | ICD-10-CM | POA: Insufficient documentation

## 2020-08-28 DIAGNOSIS — I69351 Hemiplegia and hemiparesis following cerebral infarction affecting right dominant side: Secondary | ICD-10-CM | POA: Diagnosis not present

## 2020-08-28 DIAGNOSIS — K219 Gastro-esophageal reflux disease without esophagitis: Secondary | ICD-10-CM | POA: Insufficient documentation

## 2020-08-28 DIAGNOSIS — E785 Hyperlipidemia, unspecified: Secondary | ICD-10-CM | POA: Insufficient documentation

## 2020-08-28 DIAGNOSIS — Z809 Family history of malignant neoplasm, unspecified: Secondary | ICD-10-CM | POA: Insufficient documentation

## 2020-08-28 DIAGNOSIS — Z8572 Personal history of non-Hodgkin lymphomas: Secondary | ICD-10-CM | POA: Insufficient documentation

## 2020-08-28 DIAGNOSIS — D472 Monoclonal gammopathy: Secondary | ICD-10-CM | POA: Insufficient documentation

## 2020-08-28 DIAGNOSIS — E1165 Type 2 diabetes mellitus with hyperglycemia: Secondary | ICD-10-CM | POA: Diagnosis not present

## 2020-08-28 DIAGNOSIS — Z8049 Family history of malignant neoplasm of other genital organs: Secondary | ICD-10-CM | POA: Insufficient documentation

## 2020-08-28 DIAGNOSIS — I10 Essential (primary) hypertension: Secondary | ICD-10-CM | POA: Insufficient documentation

## 2020-08-28 DIAGNOSIS — N400 Enlarged prostate without lower urinary tract symptoms: Secondary | ICD-10-CM | POA: Insufficient documentation

## 2020-08-28 DIAGNOSIS — Z87891 Personal history of nicotine dependence: Secondary | ICD-10-CM | POA: Insufficient documentation

## 2020-08-28 DIAGNOSIS — I69322 Dysarthria following cerebral infarction: Secondary | ICD-10-CM | POA: Diagnosis not present

## 2020-08-29 ENCOUNTER — Encounter: Payer: Medicare Other | Attending: Registered Nurse | Admitting: Physical Medicine & Rehabilitation

## 2020-08-29 ENCOUNTER — Telehealth: Payer: Self-pay

## 2020-08-29 ENCOUNTER — Encounter: Payer: Self-pay | Admitting: Physical Medicine & Rehabilitation

## 2020-08-29 ENCOUNTER — Other Ambulatory Visit: Payer: Self-pay

## 2020-08-29 VITALS — BP 150/81 | HR 78 | Temp 98.3°F

## 2020-08-29 DIAGNOSIS — I6529 Occlusion and stenosis of unspecified carotid artery: Secondary | ICD-10-CM

## 2020-08-29 DIAGNOSIS — I639 Cerebral infarction, unspecified: Secondary | ICD-10-CM | POA: Diagnosis not present

## 2020-08-29 DIAGNOSIS — G620 Drug-induced polyneuropathy: Secondary | ICD-10-CM

## 2020-08-29 DIAGNOSIS — T451X5A Adverse effect of antineoplastic and immunosuppressive drugs, initial encounter: Secondary | ICD-10-CM | POA: Diagnosis not present

## 2020-08-29 NOTE — Telephone Encounter (Signed)
Incoming call from pt on triage line, he states that has recently had a stroke and is not mobile enough yet to make it to his upcoming appts. Lab appt cancelled. Pt's appt with provider changed to virtual per his request.

## 2020-08-29 NOTE — Progress Notes (Signed)
Subjective:    Patient ID: Peter Ryan, male    DOB: Oct 15, 1950, 69 y.o.   MRN: [id number] 69 y.o. right-handed male with history of obesity, BPH, quit smoking 46 years ago hypertension, diabetes mellitus, history of rectal bleeding, hyperlipidemia, non-Hodgkin's lymphoma with chemotherapy now in remission.  Patient lives with spouse 1 level home 4 steps to entry.  Independent prior to admission with assistive device.  Presented 06/05/2020 with right side weakness and dysarthria.  Cranial CT scan unremarkable.  Patient did not receive TPA.  CT angiogram of head and neck with no large vessel occlusion.  MRI of the brain showed moderate sized left pontine infarction no associated hemorrhage or mass-effect.  Stable to perhaps slightly faded acute white matter lacunar infarct adjacent to the right frontal lobe.  .  Echocardiogram with ejection fraction of 94% grade 1 diastolic dysfunction.  TEE with no acute findings and did not reveal any PFO.  Planning loop recorder discussion as outpatient with cardiology services.  Currently maintained on aspirin for CVA prophylaxis.  No DAPT at this time due to history of rectal bleeding.  Therapy evaluations completed and patient was admitted for a comprehensive rehab program   He/  has had improvement in activity tolerance, balance, postural control as well as ability to compensate for deficits. He/ has had improvement in functional use RUE/LUE  and RLE/LLE as well as improvement in awareness.  Supine to sit transfers minimal assist stand pivot transfers to wheelchair rolling walker contact-guard assist.  Min assist to contact-guard assist throughout sessions for attention to right lower extremity.  Patient assisted to don shoe sitting edge of bed with total assist.  Worked on propelling wheelchair in hallway to start sessions with overall moderate assist.  Admit date: 06/14/2020 Discharge date: 07/11/2020  HPI   HHPT, [place]  Seen by Neuro for Neuropathy, Chemo  induced as well as  B12 deficiency neuropathy.  Needs assist for sponge bath, no grab bars  Wife assists with dressing, Lower body  Amb with walker but wife follow with WC, usually first thing in am  When he is not steady   No falls, a couple near falls     Pain Inventory Average Pain 0 Pain Right Now 0 My pain is na  In the last 24 hours, has pain interfered with the following? General activity 0 Relation with others 0 Enjoyment of life 0 What TIME of day is your pain at its worst? NA Sleep (in general) Good  Pain is worse with: nA Pain improves with: NA Relief from Meds: NA  Family History  Problem Relation Age of Onset  . Uterine cancer Mother   . Diabetes Mother   . Cancer Mother   . Cancer Maternal Uncle   . Bladder Cancer Neg Hx   . Kidney disease Neg Hx   . Prostate cancer Neg Hx    Social History   Socioeconomic History  . Marital status: Married    Spouse name: [name]  . Number of children: Not on file  . Years of education: Not on file  . Highest education level: Not on file  Occupational History  . Not on file  Tobacco Use  . Smoking status: Former Smoker    Packs/day: 1.50    Years: 8.00    Pack years: 12.00    Types: Cigarettes    Quit date: 05/29/1974    Years since quitting: 46.2  . Smokeless tobacco: Never Used  . Tobacco comment: quit 40 years ago  Vaping Use  . Vaping Use: Never used  Substance and Sexual Activity  . Alcohol use: No    Alcohol/week: 0.0 standard drinks    Comment: rarely  . Drug use: No  . Sexual activity: Not on file  Other Topics Concern  . Not on file  Social History Narrative   Lives with wife   Right handed   Drinks 1-2 cups coffee   Social Determinants of Health   Financial Resource Strain:   . Difficulty of Paying Living Expenses: Not on file  Food Insecurity:   . Worried About Charity fundraiser in the Last Year: Not on file  . Ran Out of Food in the Last Year: Not on file  Transportation  Needs:   . Lack of Transportation (Medical): Not on file  . Lack of Transportation (Non-Medical): Not on file  Physical Activity:   . Days of Exercise per Week: Not on file  . Minutes of Exercise per Session: Not on file  Stress:   . Feeling of Stress : Not on file  Social Connections:   . Frequency of Communication with Friends and Family: Not on file  . Frequency of Social Gatherings with Friends and Family: Not on file  . Attends Religious Services: Not on file  . Active Member of Clubs or Organizations: Not on file  . Attends Archivist Meetings: Not on file  . Marital Status: Not on file   Past Surgical History:  Procedure Laterality Date  . hydrocelectomy    . IR FLUORO GUIDE PORT INSERTION RIGHT  04/11/2017  . IR REMOVAL TUN ACCESS W/ PORT W/O FL MOD SED  05/29/2018  . PILONIDAL CYST EXCISION    . TEE WITHOUT CARDIOVERSION N/A 06/09/2020   Procedure: TRANSESOPHAGEAL ECHOCARDIOGRAM (TEE);  Surgeon: Minna Merritts, MD;  Location: [facility] ORS;  Service: Cardiovascular;  Laterality: N/A;   Past Surgical History:  Procedure Laterality Date  . hydrocelectomy    . IR FLUORO GUIDE PORT INSERTION RIGHT  04/11/2017  . IR REMOVAL TUN ACCESS W/ PORT W/O FL MOD SED  05/29/2018  . PILONIDAL CYST EXCISION    . TEE WITHOUT CARDIOVERSION N/A 06/09/2020   Procedure: TRANSESOPHAGEAL ECHOCARDIOGRAM (TEE);  Surgeon: Minna Merritts, MD;  Location: [facility] ORS;  Service: Cardiovascular;  Laterality: N/A;   Past Medical History:  Diagnosis Date  . Arthritis   . BPH (benign prostatic hyperplasia)   . Cancer ([place])    Non-Hodgkins lymphoma  . Chronic prostatitis   . Cirrhosis of liver ([place])   . Diabetes mellitus without complication ([place])   . Dyslipidemia   . Elevated PSA   . Gastric reflux   . Gout   . HTN (hypertension)   . Neuropathy   . Over weight   . Psoriasis   . Testicular pain, right   . Urinary frequency    BP (!) 150/81   Pulse 78   Temp 98.3 F (36.8 C)   SpO2  96%   Opioid Risk Score:   Fall Risk Score:  `1  Depression screen PHQ 2/9  Depression screen [facility] 2/9 08/02/2020 07/20/2020 05/02/2020 01/29/2018 12/02/2016  Decreased Interest 0 1 0 0 0  Down, Depressed, Hopeless 0 1 0 0 3  PHQ - 2 Score 0 2 0 0 3  Altered sleeping 0 0 0 0 0  Tired, decreased energy 0 0 0 2 1  Change in appetite 0 0 0 3 0  Feeling bad or failure about yourself  0 1 0 0 0  Trouble concentrating 0 0 0 0 0  Moving slowly or fidgety/restless 0 0 0 0 0  Suicidal thoughts 0 0 0 0 0  PHQ-9 Score 0 3 0 5 4  Difficult doing work/chores - Not difficult at all Not difficult at all Not difficult at all -  Some recent data might be hidden     Review of Systems     Objective:   Physical Exam Vitals reviewed.  Constitutional:      Appearance: He is obese.  Eyes:     Extraocular Movements: Extraocular movements intact.     Pupils: Pupils are equal, round, and reactive to light.  Cardiovascular:     Rate and Rhythm: Normal rate and regular rhythm.     Heart sounds: Normal heart sounds.  Pulmonary:     Effort: Pulmonary effort is normal.     Breath sounds: Normal breath sounds.  Skin:    General: Skin is warm and dry.  Neurological:     Mental Status: He is alert.  Psychiatric:        Mood and Affect: Mood normal.        Behavior: Behavior normal.   Motor strength is 5/5 in the left deltoid, bicep, tricep, grip, hip flexor, knee extensor, ankle dorsiflexor and plantar flexor 4+/5 in the right deltoid, bicep, tricep, grip, hip flexor knee extensor, ankle dorsiflexor and plantar flexor Sensation intact to light touch and pinprick bilateral upper extremities The patient has reduced sensation to pinprick below the mid calf level bilaterally in the lower extremities The patient can stand with wide-based support with supervision assistance.  Sit to stand transfers with supervision assistance. Cerebellar no evidence of dysmetria on finger-nose-finger Fine motor intact finger  to thumb opposition No evidence of dysdiadochokinesia kinesis with rapid alternating supination of pronation bilateral upper extremities.        Assessment & Plan:  #1.  Left pontine infarct small vessel disease with risk factors of diabetes hypertension hyper lip.  The patient has had a good recovery.  Residual balance problem appears to be more related to his severe bilateral lower extremity neuropathy. Continue home health PT OT, once this is done would recommend outpatient PT OT 2.  Chemo induced as well as B12 neuropathy follow-up with neurology significant pain Physical medicine rehab follow-up in 2 months No driving

## 2020-08-29 NOTE — Progress Notes (Signed)
Hematology/Oncology Consult note [facility]  [phone] [phone]  Patient Care Team: [name]., MD as PCP - General (Family Medicine) [name], MD as Consulting Physician (Pulmonary Disease) Sindy Guadeloupe, MD as Consulting Physician (Oncology) Verlon Au, NP as Nurse Practitioner (Nurse Practitioner) Leona Singleton, RN as Oncology Nurse Navigator   Name of the patient: Peter Ryan  [id number]  1951/01/26   Date of visit: 08/29/20  Diagnosis- Stage IV DLBCL GCB IPI score- low intermediate risk in CR1 IgA MGUS  Chief complaint/ Reason for visit-discuss results of recent blood work done for neuropathy work-up  Heme/Onc history: 1. Patient is a 69 year old male who initially presented with symptoms of worsening shortness of breathand was found to have right pleural effusion on chest x-ray. CT chest with contrast on 11/12/2016 revealed multiple lymph nodes throughout the mediastinum measuring approximately 1 cm in short axis. Upper abdomen showed evidence of ascites and mild cirrhotic changes in the liver. Patient underwent thoracocentesis for recurrent right pleural effusion. Fluid was exudative in nature and LDH in the pleural fluid was elevated. Cytology was negative for malignancy. Patient also had peripheral flow cytometry done which was negative for malignancy. Pleural and peritoneal fluid cytology has been negative for malignancy/ lymphoma  2. Ultrasound of the abdomen on 01/24/2017 showed sludge in the gallbladder and parenchymal of the liver suggesting cirrhosis as well as moderate ascites. Initially his ascites with attribute it to his cirrhosis and nonalcoholic fatty liver disease. He was also treated for culture negative neutrophilic SBP based on asciticfluid analysis. Given that the ascites fluid was exudative in nature which is not consistent with cirrhosis from nonalcoholic fatty liver disease as well as  no evidence of splenomegaly or abnormal LFTs, CT abdomen was obtained to rule out any other etiology for ascites  3. CT abdomen on 03/21/2017 showed: IMPRESSION: 1. Bulky central mesenteric mass lesion encasing major mesenteric vascular anatomy without substantial mass-effect. Diffuse omental caking is evident and peritoneal enhancement is identified. There is associated mesenteric, juxta diaphragmatic, retroperitoneal, and retrocrural lymphadenopathy. Lymphoma would be a consideration. Metastatic disease could also have this appearance. 2. Small a moderate volume ascites  4. Patient denies any unintentional weight loss. His appetite is fair denies any drenching night sweats or recurrent fevers. Does report fatigue  5. PET CT showed: IMPRESSION: 1. Large hypermetabolic left eccentric mesenteric mass with extensive omental caking and peritoneal spread of tumor. This mechanism of spread would seem to favor a gastrointestinal primary, with entities such as carcinoid tumor or lymphoma as differential diagnostic considerations. Tissue diagnosis recommended. 2. Hypermetabolic mediastinal adenopathy compatible with malignancy. Moderate to large bilateral pleural effusions with passive atelectasis and low-grade activity along the pleural fluid-early malignant pleural effusions not excluded. 3. Aortic Atherosclerosis (ICD10-I70.0). 4. No definite involvement of the neck or skeleton. 5. Moderately enlarged prostate gland.  6. Omental biopsy showed: DIAGNOSIS:  A. SOFT TISSUE MASS, LEFT MESENTERY; CT-GUIDED CORE BIOPSY:  - LARGE B-CELL LYMPHOMA, CD10 POSITIVE  FISH testing for BCL 6 and cmycwas negative. Therefore he does not have a double hit lymphoma. Bone marrow biopsy was negative for lymphoma. Normal LDH. He did not meet criteria for cns prophylaxis. His only risk factors being age and stage IV   Interval history-patient had a left pontine stroke in October 2021 following which  she had right-sided hemiparesis and spent several weeks in inpatient rehab.  Currently patient is recovering in outpatient rehab at home but continues to  have difficulty with ambulation which is made worse because of his pre-existing neuropathy.  He is just able to walk a couple of steps with the help of a walker.  As a part of neuropathy work-up Neurology had ordered myeloma work-up which showed 0.4 g of IgA kappa monoclonal protein and hence patient has been referred back to me.  ECOG PS- 2-3 Pain scale- 0   Review of systems- Review of Systems  Constitutional: Negative for chills, fever, malaise/fatigue and weight loss.  HENT: Negative for congestion, ear discharge and nosebleeds.   Eyes: Negative for blurred vision.  Respiratory: Negative for cough, hemoptysis, sputum production, shortness of breath and wheezing.   Cardiovascular: Negative for chest pain, palpitations, orthopnea and claudication.  Gastrointestinal: Negative for abdominal pain, blood in stool, constipation, diarrhea, heartburn, melena, nausea and vomiting.  Genitourinary: Negative for dysuria, flank pain, frequency, hematuria and urgency.  Musculoskeletal: Negative for back pain, joint pain and myalgias.  Skin: Negative for rash.  Neurological: Positive for sensory change (Chemo-induced peripheral neuropathy) and focal weakness (From recent stroke.  Right-sided hemiparesis). Negative for dizziness, tingling, seizures, weakness and headaches.  Endo/Heme/Allergies: Does not bruise/bleed easily.  Psychiatric/Behavioral: Negative for depression and suicidal ideas. The patient does not have insomnia.       Allergies  Allergen Reactions  . Hctz [Hydrochlorothiazide] Other (See Comments)    Gout flare  . Metformin Other (See Comments)    Stomach upset      Past Medical History:  Diagnosis Date  . Arthritis   . BPH (benign prostatic hyperplasia)   . Cancer ([place])    Non-Hodgkins lymphoma  . Chronic prostatitis   .  Cirrhosis of liver ([place])   . Diabetes mellitus without complication ([place])   . Dyslipidemia   . Elevated PSA   . Gastric reflux   . Gout   . HTN (hypertension)   . Neuropathy   . Over weight   . Psoriasis   . Testicular pain, right   . Urinary frequency      Past Surgical History:  Procedure Laterality Date  . hydrocelectomy    . IR FLUORO GUIDE PORT INSERTION RIGHT  04/11/2017  . IR REMOVAL TUN ACCESS W/ PORT W/O FL MOD SED  05/29/2018  . PILONIDAL CYST EXCISION    . TEE WITHOUT CARDIOVERSION N/A 06/09/2020   Procedure: TRANSESOPHAGEAL ECHOCARDIOGRAM (TEE);  Surgeon: Minna Merritts, MD;  Location: [facility] ORS;  Service: Cardiovascular;  Laterality: N/A;    Social History   Socioeconomic History  . Marital status: Married    Spouse name: [name]  . Number of children: Not on file  . Years of education: Not on file  . Highest education level: Not on file  Occupational History  . Not on file  Tobacco Use  . Smoking status: Former Smoker    Packs/day: 1.50    Years: 8.00    Pack years: 12.00    Types: Cigarettes    Quit date: 05/29/1974    Years since quitting: 46.2  . Smokeless tobacco: Never Used  . Tobacco comment: quit 40 years ago  Vaping Use  . Vaping Use: Never used  Substance and Sexual Activity  . Alcohol use: No    Alcohol/week: 0.0 standard drinks    Comment: rarely  . Drug use: No  . Sexual activity: Not on file  Other Topics Concern  . Not on file  Social History Narrative   Lives with wife   Right handed   Drinks 1-2 cups  coffee   Social Determinants of Health   Financial Resource Strain:   . Difficulty of Paying Living Expenses: Not on file  Food Insecurity:   . Worried About Charity fundraiser in the Last Year: Not on file  . Ran Out of Food in the Last Year: Not on file  Transportation Needs:   . Lack of Transportation (Medical): Not on file  . Lack of Transportation (Non-Medical): Not on file  Physical Activity:   . Days of Exercise per  Week: Not on file  . Minutes of Exercise per Session: Not on file  Stress:   . Feeling of Stress : Not on file  Social Connections:   . Frequency of Communication with Friends and Family: Not on file  . Frequency of Social Gatherings with Friends and Family: Not on file  . Attends Religious Services: Not on file  . Active Member of Clubs or Organizations: Not on file  . Attends Archivist Meetings: Not on file  . Marital Status: Not on file  Intimate Partner Violence:   . Fear of Current or Ex-Partner: Not on file  . Emotionally Abused: Not on file  . Physically Abused: Not on file  . Sexually Abused: Not on file    Family History  Problem Relation Age of Onset  . Uterine cancer Mother   . Diabetes Mother   . Cancer Mother   . Cancer Maternal Uncle   . Bladder Cancer Neg Hx   . Kidney disease Neg Hx   . Prostate cancer Neg Hx      Current Outpatient Medications:  .  acetaminophen (TYLENOL) 325 MG tablet, Take 2 tablets (650 mg total) by mouth every 4 (four) hours as needed for mild pain (or temp > 37.5 C (99.5 F))., Disp: , Rfl:  .  allopurinol (ZYLOPRIM) 300 MG tablet, Take 1 tablet (300 mg total) by mouth daily., Disp: 90 tablet, Rfl: 0 .  amLODipine (NORVASC) 10 MG tablet, Take 1 tablet (10 mg total) by mouth daily., Disp: 30 tablet, Rfl: 5 .  aspirin EC 325 MG EC tablet, Take 1 tablet (325 mg total) by mouth daily., Disp: 30 tablet, Rfl: 0 .  atorvastatin (LIPITOR) 80 MG tablet, TAKE ONE TABLET (80 MG) BY MOUTH EVERY DAY, Disp: 30 tablet, Rfl: 0 .  Cholecalciferol (VITAMIN D3) 25 MCG (1000 UT) CAPS, Take 1 capsule (1,000 Units total) by mouth daily., Disp: 60 capsule, Rfl: 0 .  cloNIDine (CATAPRES) 0.1 MG tablet, Take 1 tablet (0.1 mg total) by mouth 2 (two) times daily., Disp: 60 tablet, Rfl: 11 .  finasteride (PROSCAR) 5 MG tablet, Take 1 tablet (5 mg total) by mouth daily., Disp: 30 tablet, Rfl: 0 .  glimepiride (AMARYL) 2 MG tablet, Take 1 tablet (2 mg total)  by mouth daily with breakfast., Disp: 30 tablet, Rfl: 0 .  hydrocortisone 2.5 % lotion, SMARTSIG:Sparingly Topical 3 Times a Week, Disp: , Rfl:  .  losartan (COZAAR) 100 MG tablet, TAKE ONE TABLET EVERY DAY BY MOUTH, Disp: 90 tablet, Rfl: 1 .  polyethylene glycol (MIRALAX / GLYCOLAX) 17 g packet, Take 17 g by mouth daily., Disp: 14 each, Rfl: 0 .  vitamin B-12 (CYANOCOBALAMIN) 1000 MCG tablet, Take 1 tablet (1,000 mcg total) by mouth daily., Disp: 30 tablet, Rfl: 0 No current facility-administered medications for this visit.  Facility-Administered Medications Ordered in Other Visits:  .  sodium chloride flush (NS) 0.9 % injection 10 mL, 10 mL, Intravenous, PRN, Randa Evens  C, MD, 10 mL at 05/12/18 1421  Physical exam:  Vitals:   08/28/20 1507  BP: (!) 157/84  Pulse: 82  Resp: 16  Temp: 97.8 F (36.6 C)  TempSrc: Tympanic  SpO2: 99%   Physical Exam Constitutional:      Comments: Patient is sitting in a wheelchair.  Appears in no acute distress  Cardiovascular:     Rate and Rhythm: Normal rate and regular rhythm.     Heart sounds: Normal heart sounds.  Pulmonary:     Effort: Pulmonary effort is normal.     Breath sounds: Normal breath sounds.  Abdominal:     General: Bowel sounds are normal.     Palpations: Abdomen is soft.  Lymphadenopathy:     Comments: No palpable cervical, supraclavicular, axillary or inguinal adenopathy.  Exam limited as patient is unable to sit on the examination table   Skin:    General: Skin is warm and dry.  Neurological:     Mental Status: He is alert and oriented to person, place, and time.      CMP Latest Ref Rng & Units 08/11/2020  Glucose 70 - 99 mg/dL -  BUN 8 - 23 mg/dL -  Creatinine 1.86 - 0.26 mg/dL -  Sodium 269 - 239 mmol/L -  Potassium 3.5 - 5.1 mmol/L -  Chloride 98 - 111 mmol/L -  CO2 22 - 32 mmol/L -  Calcium 8.9 - 10.3 mg/dL -  Total Protein 6.0 - 8.5 g/dL 6.9  Total Bilirubin 0.3 - 1.2 mg/dL -  Alkaline Phos 38 - 074 U/L  -  AST 15 - 41 U/L -  ALT 0 - 44 U/L -   CBC Latest Ref Rng & Units 06/21/2020  WBC 4.0 - 10.5 K/uL 7.4  Hemoglobin 13.0 - 17.0 g/dL 97.3  Hematocrit 39 - 52 % 40.1  Platelets 150 - 400 K/uL 246      Assessment and plan- Patient is a 69 y.o. male with history of diffuse large B cell lymphoma s/p 6 cycles of R-CHOP currently in CR 1 referred for IgA MGUS  IgA MGUS: Patient was noted to have a small 0.4 g of IgA kappa protein on his myeloma panel.  This likely represents IgA MGUS.  I will evaluate for any value of IgA monoclonal protein a bone marrow biopsy is recommended.  However given that patient does not have any evidence of anemia or renal failure or hypercalcemia given that he is currently recovering from his acute stroke I would like to hold off on any bone marrow biopsy at this time.  I would like him to get a random urine protein electrophoresis and serum free light chains to complete his myeloma work-up as well as a bone survey.  Patient would like to hold off on a bone survey at this time until he recovers further from his stroke.  He is having another set of blood work with Dr. Sullivan Lone in 2 to 3 weeks time and we will get in touch with him to see if serum free light chain and random urine protein electrophoresis can be done during that visit.  Discussed with the patient the spectrum from MGUS to overt multiple myeloma.  Patient likely has IgA MGUS which does not require any treatment at this time and can be monitored conservatively.  I will plan to see him back in 6 months with CBC with differential CMP myeloma panel and serum free light chains  From a lymphoma standpoint patient does  not have any evidence of B symptoms or cytopenias.  Limited lymph node exam was performed today as patient could not sit up on the examination table which did not reveal any palpable adenopathy.  He does not require any surveillance imaging for this   Visit Diagnosis 1. MGUS (monoclonal gammopathy of  unknown significance)      Dr. Randa Evens, MD, MPH [facility] [phone] 08/29/2020 1:05 PM

## 2020-08-29 NOTE — Patient Instructions (Signed)
[email or web address]

## 2020-08-30 DIAGNOSIS — I10 Essential (primary) hypertension: Secondary | ICD-10-CM | POA: Diagnosis not present

## 2020-08-30 DIAGNOSIS — M109 Gout, unspecified: Secondary | ICD-10-CM | POA: Diagnosis not present

## 2020-08-30 DIAGNOSIS — E1165 Type 2 diabetes mellitus with hyperglycemia: Secondary | ICD-10-CM | POA: Diagnosis not present

## 2020-08-30 DIAGNOSIS — M1990 Unspecified osteoarthritis, unspecified site: Secondary | ICD-10-CM | POA: Diagnosis not present

## 2020-08-30 DIAGNOSIS — I69322 Dysarthria following cerebral infarction: Secondary | ICD-10-CM | POA: Diagnosis not present

## 2020-08-30 DIAGNOSIS — I69351 Hemiplegia and hemiparesis following cerebral infarction affecting right dominant side: Secondary | ICD-10-CM | POA: Diagnosis not present

## 2020-09-01 ENCOUNTER — Other Ambulatory Visit: Payer: Self-pay

## 2020-09-01 DIAGNOSIS — I69351 Hemiplegia and hemiparesis following cerebral infarction affecting right dominant side: Secondary | ICD-10-CM | POA: Diagnosis not present

## 2020-09-01 DIAGNOSIS — M109 Gout, unspecified: Secondary | ICD-10-CM | POA: Diagnosis not present

## 2020-09-01 DIAGNOSIS — I10 Essential (primary) hypertension: Secondary | ICD-10-CM | POA: Diagnosis not present

## 2020-09-01 DIAGNOSIS — M1990 Unspecified osteoarthritis, unspecified site: Secondary | ICD-10-CM | POA: Diagnosis not present

## 2020-09-01 DIAGNOSIS — E1165 Type 2 diabetes mellitus with hyperglycemia: Secondary | ICD-10-CM | POA: Diagnosis not present

## 2020-09-01 DIAGNOSIS — I69322 Dysarthria following cerebral infarction: Secondary | ICD-10-CM | POA: Diagnosis not present

## 2020-09-04 ENCOUNTER — Telehealth: Payer: Self-pay

## 2020-09-04 ENCOUNTER — Other Ambulatory Visit: Payer: Self-pay | Admitting: Family Medicine

## 2020-09-04 DIAGNOSIS — E1165 Type 2 diabetes mellitus with hyperglycemia: Secondary | ICD-10-CM | POA: Diagnosis not present

## 2020-09-04 DIAGNOSIS — I69351 Hemiplegia and hemiparesis following cerebral infarction affecting right dominant side: Secondary | ICD-10-CM | POA: Diagnosis not present

## 2020-09-04 DIAGNOSIS — M1990 Unspecified osteoarthritis, unspecified site: Secondary | ICD-10-CM | POA: Diagnosis not present

## 2020-09-04 DIAGNOSIS — M109 Gout, unspecified: Secondary | ICD-10-CM | POA: Diagnosis not present

## 2020-09-04 DIAGNOSIS — I10 Essential (primary) hypertension: Secondary | ICD-10-CM | POA: Diagnosis not present

## 2020-09-04 DIAGNOSIS — I69322 Dysarthria following cerebral infarction: Secondary | ICD-10-CM | POA: Diagnosis not present

## 2020-09-04 MED ORDER — VITAMIN B-12 1000 MCG PO TABS: 1000.0000 ug | ORAL_TABLET | Freq: Every day | ORAL | 5 refills | Status: DC

## 2020-09-04 NOTE — Telephone Encounter (Signed)
Copied from [place] [id number]. Topic: General - Other >> Sep 04, 2020  4:23 PM Gillis Ends D wrote: Reason for CRM: [facility] in [place] would like to know when you draw labs for this patient if they could fax over a reacquisition to be added and placed in his chart. Please advise

## 2020-09-04 NOTE — Telephone Encounter (Signed)
[facility] faxed refill request for the following medications:  vitamin B-12 (CYANOCOBALAMIN) 1000 MCG tablet  Last Rx: 08/02/2020 LOV: 08/02/2020 Please advise. Thanks TNP

## 2020-09-04 NOTE — Progress Notes (Signed)
Virtual Visit via Video Note  I connected with Peter Ryan on 09/05/20 at  9:00 AM EST by a video enabled telemedicine application and verified that I am speaking with the correct person using two identifiers.  Location: Patient: Office Provider: Home   I discussed the limitations of evaluation and management by telemedicine and the availability of in person appointments. The patient expressed understanding and agreed to proceed.  History of Present Illness: Peter Ryan is a 69 y.o. male who has recently suffered a stroke (05/2020) with an urological history of BPH with LU TS and an elevated PSA.    PSA 1.7 in 01/2020.    BPH WITH LUTS  (prostate and/or bladder) IPSS score: 1/1   Previous score: 2/1     Major complaint(s):  None.   Denies any dysuria, hematuria or suprapubic pain.   Currently taking: finasteride 5 mg daily  Denies any recent fevers, chills, nausea or vomiting.   IPSS    Row Name 09/05/20 0900         International Prostate Symptom Score   How often have you had the sensation of not emptying your bladder? Not at All     How often have you had to urinate less than every two hours? Not at All     How often have you found you stopped and started again several times when you urinated? Not at All     How often have you found it difficult to postpone urination? Not at All     How often have you had a weak urinary stream? Not at All     How often have you had to strain to start urination? Not at All     How many times did you typically get up at night to urinate? 1 Time     Total IPSS Score 1       Quality of Life due to urinary symptoms   If you were to spend the rest of your life with your urinary condition just the way it is now how would you feel about that? Pleased            Score:  1-7 Mild 8-19 Moderate 20-35 Severe    Observations/Objective: He is answering questions appropriately and does not sound distressed.    Assessment and  Plan: 1. BPH with LU TS Controlled with finasteride  Follow Up Instructions:   RTC in 6 months for PSA, I PSS and exam    I discussed the assessment and treatment plan with the patient. The patient was provided an opportunity to ask questions and all were answered. The patient agreed with the plan and demonstrated an understanding of the instructions.   The patient was advised to call back or seek an in-person evaluation if the symptoms worsen or if the condition fails to improve as anticipated.  I provided 10 minutes of non-face-to-face time during this encounter.   Anchor Dwan, PA-C

## 2020-09-04 NOTE — Telephone Encounter (Signed)
Please advise 

## 2020-09-05 ENCOUNTER — Telehealth (INDEPENDENT_AMBULATORY_CARE_PROVIDER_SITE_OTHER): Payer: Medicare Other | Admitting: Urology

## 2020-09-05 ENCOUNTER — Encounter: Payer: Self-pay | Admitting: Urology

## 2020-09-05 ENCOUNTER — Other Ambulatory Visit: Payer: Self-pay

## 2020-09-05 ENCOUNTER — Other Ambulatory Visit: Payer: Self-pay | Admitting: *Deleted

## 2020-09-05 VITALS — Ht 70.0 in | Wt 270.0 lb

## 2020-09-05 DIAGNOSIS — I6529 Occlusion and stenosis of unspecified carotid artery: Secondary | ICD-10-CM

## 2020-09-05 DIAGNOSIS — N401 Enlarged prostate with lower urinary tract symptoms: Secondary | ICD-10-CM | POA: Diagnosis not present

## 2020-09-05 DIAGNOSIS — N138 Other obstructive and reflux uropathy: Secondary | ICD-10-CM

## 2020-09-05 MED ORDER — FINASTERIDE 5 MG PO TABS: 5.0000 mg | ORAL_TABLET | Freq: Every day | ORAL | 3 refills | Status: DC

## 2020-09-05 NOTE — Telephone Encounter (Signed)
Ok with me 

## 2020-09-06 DIAGNOSIS — I69351 Hemiplegia and hemiparesis following cerebral infarction affecting right dominant side: Secondary | ICD-10-CM | POA: Diagnosis not present

## 2020-09-06 DIAGNOSIS — E1165 Type 2 diabetes mellitus with hyperglycemia: Secondary | ICD-10-CM | POA: Diagnosis not present

## 2020-09-06 DIAGNOSIS — M1990 Unspecified osteoarthritis, unspecified site: Secondary | ICD-10-CM | POA: Diagnosis not present

## 2020-09-06 DIAGNOSIS — M109 Gout, unspecified: Secondary | ICD-10-CM | POA: Diagnosis not present

## 2020-09-06 DIAGNOSIS — I69322 Dysarthria following cerebral infarction: Secondary | ICD-10-CM | POA: Diagnosis not present

## 2020-09-06 DIAGNOSIS — I10 Essential (primary) hypertension: Secondary | ICD-10-CM | POA: Diagnosis not present

## 2020-09-06 NOTE — Telephone Encounter (Signed)
Patient was advised.  

## 2020-09-07 ENCOUNTER — Encounter: Payer: Self-pay | Admitting: Family Medicine

## 2020-09-07 ENCOUNTER — Other Ambulatory Visit: Payer: Self-pay

## 2020-09-07 ENCOUNTER — Telehealth (INDEPENDENT_AMBULATORY_CARE_PROVIDER_SITE_OTHER): Payer: Medicare Other | Admitting: Family Medicine

## 2020-09-07 DIAGNOSIS — R300 Dysuria: Secondary | ICD-10-CM | POA: Diagnosis not present

## 2020-09-07 DIAGNOSIS — I6529 Occlusion and stenosis of unspecified carotid artery: Secondary | ICD-10-CM

## 2020-09-07 DIAGNOSIS — N401 Enlarged prostate with lower urinary tract symptoms: Secondary | ICD-10-CM | POA: Diagnosis not present

## 2020-09-07 DIAGNOSIS — N138 Other obstructive and reflux uropathy: Secondary | ICD-10-CM | POA: Diagnosis not present

## 2020-09-07 LAB — POCT URINALYSIS DIPSTICK
Bilirubin, UA: NEGATIVE
Glucose, UA: NEGATIVE
Ketones, UA: NEGATIVE
Leukocytes, UA: NEGATIVE
Nitrite, UA: NEGATIVE
Protein, UA: POSITIVE — AB
Spec Grav, UA: 1.025 (ref 1.010–1.025)
Urobilinogen, UA: 0.2 E.U./dL
pH, UA: 6 (ref 5.0–8.0)

## 2020-09-07 MED ORDER — SULFAMETHOXAZOLE-TRIMETHOPRIM 800-160 MG PO TABS: 1.0000 | ORAL_TABLET | Freq: Two times a day (BID) | ORAL | 0 refills | Status: DC

## 2020-09-07 NOTE — Addendum Note (Signed)
Addended by: Randal Buba on: 09/07/2020 03:14 PM   Modules accepted: Orders

## 2020-09-07 NOTE — Progress Notes (Signed)
Virtual Telephone Visit    Virtual Visit via Video Note   This visit type was conducted due to national recommendations for restrictions regarding the COVID-19 Pandemic (e.g. social distancing) in an effort to limit this patient's exposure and mitigate transmission in our community. This patient is at least at moderate risk for complications without adequate follow up. This format is felt to be most appropriate for this patient at this time. Physical exam was limited by quality of the video and audio technology used for the visit.   Patient location: Home Provider location: Office  I discussed the limitations of evaluation and management by telemedicine and the availability of in person appointments. The patient expressed understanding and agreed to proceed.  Patient: Peter Ryan   DOB: 09-05-1951   69 y.o. Male  MRN: [id number] Visit Date: 09/07/2020  Today's healthcare provider: Vernie Murders, PA-C   Chief Complaint  Patient presents with  . Dysuria   Subjective    Dysuria  This is a new problem. Episode onset: 2 days ago. The problem occurs intermittently. The problem has been unchanged. The quality of the pain is described as burning. There has been no fever. Associated symptoms include hematuria. Pertinent negatives include no chills, nausea or vomiting. He has tried nothing for the symptoms.    Past Medical History:  Diagnosis Date  . Arthritis   . BPH (benign prostatic hyperplasia)   . Cancer ([place])    Non-Hodgkins lymphoma  . Chronic prostatitis   . Cirrhosis of liver ([place])   . Diabetes mellitus without complication ([place])   . Dyslipidemia   . Elevated PSA   . Gastric reflux   . Gout   . HTN (hypertension)   . Neuropathy   . Over weight   . Psoriasis   . Testicular pain, right   . Urinary frequency    Past Surgical History:  Procedure Laterality Date  . hydrocelectomy    . IR FLUORO GUIDE PORT INSERTION RIGHT  04/11/2017  . IR REMOVAL TUN ACCESS W/  PORT W/O FL MOD SED  05/29/2018  . PILONIDAL CYST EXCISION    . TEE WITHOUT CARDIOVERSION N/A 06/09/2020   Procedure: TRANSESOPHAGEAL ECHOCARDIOGRAM (TEE);  Surgeon: Minna Merritts, MD;  Location: [facility] ORS;  Service: Cardiovascular;  Laterality: N/A;   Social History   Tobacco Use  . Smoking status: Former Smoker    Packs/day: 1.50    Years: 8.00    Pack years: 12.00    Types: Cigarettes    Quit date: 05/29/1974    Years since quitting: 46.3  . Smokeless tobacco: Never Used  . Tobacco comment: quit 40 years ago  Vaping Use  . Vaping Use: Never used  Substance Use Topics  . Alcohol use: No    Alcohol/week: 0.0 standard drinks    Comment: rarely  . Drug use: No   Family History  Problem Relation Age of Onset  . Uterine cancer Mother   . Diabetes Mother   . Cancer Mother   . Cancer Maternal Uncle   . Bladder Cancer Neg Hx   . Kidney disease Neg Hx   . Prostate cancer Neg Hx    Allergies  Allergen Reactions  . Hctz [Hydrochlorothiazide] Other (See Comments)    Gout flare  . Metformin Other (See Comments)    Stomach upset       Medications: Outpatient Medications Prior to Visit  Medication Sig  . acetaminophen (TYLENOL) 325 MG tablet Take 2 tablets (650 mg total)  by mouth every 4 (four) hours as needed for mild pain (or temp > 37.5 C (99.5 F)).  Marland Kitchen allopurinol (ZYLOPRIM) 300 MG tablet Take 1 tablet (300 mg total) by mouth daily.  Marland Kitchen amLODipine (NORVASC) 10 MG tablet Take 1 tablet (10 mg total) by mouth daily.  Marland Kitchen aspirin EC 325 MG EC tablet Take 1 tablet (325 mg total) by mouth daily.  Marland Kitchen atorvastatin (LIPITOR) 80 MG tablet TAKE ONE TABLET (80 MG) BY MOUTH EVERY DAY  . Cholecalciferol (VITAMIN D3) 25 MCG (1000 UT) CAPS Take 1 capsule (1,000 Units total) by mouth daily.  . cloNIDine (CATAPRES) 0.1 MG tablet Take 1 tablet (0.1 mg total) by mouth 2 (two) times daily.  . finasteride (PROSCAR) 5 MG tablet Take 1 tablet (5 mg total) by mouth daily.  Marland Kitchen glimepiride (AMARYL) 2  MG tablet TAKE 1 TABLET BY MOUTH DAILY WITH BREAKFAST  . hydrocortisone 2.5 % lotion SMARTSIG:Sparingly Topical 3 Times a Week  . losartan (COZAAR) 100 MG tablet TAKE ONE TABLET EVERY DAY BY MOUTH  . polyethylene glycol (MIRALAX / GLYCOLAX) 17 g packet Take 17 g by mouth daily.  . vitamin B-12 (CYANOCOBALAMIN) 1000 MCG tablet Take 1 tablet (1,000 mcg total) by mouth daily.   Facility-Administered Medications Prior to Visit  Medication Dose Route Frequency Provider  . sodium chloride flush (NS) 0.9 % injection 10 mL  10 mL Intravenous PRN Sindy Guadeloupe, MD    Review of Systems  Constitutional: Negative for appetite change, chills and fever.  Respiratory: Negative for chest tightness, shortness of breath and wheezing.   Cardiovascular: Negative for chest pain and palpitations.  Gastrointestinal: Negative for abdominal pain, nausea and vomiting.  Genitourinary: Positive for dysuria and hematuria.      Objective    There were no vitals taken for this visit.   Physical Exam: during telephonic interview, no apparent respiratory distress.     Assessment & Plan    1. Dysuria Developed some discomfort to urinate with questionable blood in urine over the past 2 days. No fever, abdominal pain or back pain. Encouraged to drink extra fluids and start Bactrim-DS. Due to a stroke affecting the right side of his body, has a great deal of difficulty getting into the office. Wife will bring by a urine specimen for urinalysis and C&S. Recheck pending reports. - CULTURE, URINE COMPREHENSIVE; Future - Urinalysis, Routine w reflex microscopic; Future - sulfamethoxazole-trimethoprim (BACTRIM DS) 800-160 MG tablet; Take 1 tablet by mouth 2 (two) times daily.  Dispense: 20 tablet; Refill: 0  2. BPH with obstruction/lower urinary tract symptoms Presently on Finasteride 5 mg qd and followed by an urologist. Keep follow up appointment with Dr. Rosanna Randy on 09-13-20.   No follow-ups on file.     I  discussed the assessment and treatment plan with the patient. The patient was provided an opportunity to ask questions and all were answered. The patient agreed with the plan and demonstrated an understanding of the instructions.   The patient was advised to call back or seek an in-person evaluation if the symptoms worsen or if the condition fails to improve as anticipated.  I provided 20 minutes of non-face-to-face time during this encounter.  I, Rogerick Baldwin, PA-C, have reviewed all documentation for this visit. The documentation on 09/07/20 for the exam, diagnosis, procedures, and orders are all accurate and complete.   Vernie Murders, PA-C [employer] [phone] (phone) [phone] (fax)  [place]

## 2020-09-07 NOTE — Addendum Note (Signed)
Addended by: Randal Buba on: 09/07/2020 03:07 PM   Modules accepted: Orders

## 2020-09-11 DIAGNOSIS — M109 Gout, unspecified: Secondary | ICD-10-CM | POA: Diagnosis not present

## 2020-09-11 DIAGNOSIS — M1990 Unspecified osteoarthritis, unspecified site: Secondary | ICD-10-CM | POA: Diagnosis not present

## 2020-09-11 DIAGNOSIS — I69351 Hemiplegia and hemiparesis following cerebral infarction affecting right dominant side: Secondary | ICD-10-CM | POA: Diagnosis not present

## 2020-09-11 DIAGNOSIS — I69322 Dysarthria following cerebral infarction: Secondary | ICD-10-CM | POA: Diagnosis not present

## 2020-09-11 DIAGNOSIS — I10 Essential (primary) hypertension: Secondary | ICD-10-CM | POA: Diagnosis not present

## 2020-09-11 DIAGNOSIS — E1165 Type 2 diabetes mellitus with hyperglycemia: Secondary | ICD-10-CM | POA: Diagnosis not present

## 2020-09-13 ENCOUNTER — Ambulatory Visit: Payer: Self-pay | Admitting: Family Medicine

## 2020-09-13 DIAGNOSIS — Z7982 Long term (current) use of aspirin: Secondary | ICD-10-CM | POA: Diagnosis not present

## 2020-09-13 DIAGNOSIS — Z87891 Personal history of nicotine dependence: Secondary | ICD-10-CM | POA: Diagnosis not present

## 2020-09-13 DIAGNOSIS — I69351 Hemiplegia and hemiparesis following cerebral infarction affecting right dominant side: Secondary | ICD-10-CM | POA: Diagnosis not present

## 2020-09-13 DIAGNOSIS — Z792 Long term (current) use of antibiotics: Secondary | ICD-10-CM | POA: Diagnosis not present

## 2020-09-13 DIAGNOSIS — I69322 Dysarthria following cerebral infarction: Secondary | ICD-10-CM | POA: Diagnosis not present

## 2020-09-13 DIAGNOSIS — I10 Essential (primary) hypertension: Secondary | ICD-10-CM | POA: Diagnosis not present

## 2020-09-13 DIAGNOSIS — C8590 Non-Hodgkin lymphoma, unspecified, unspecified site: Secondary | ICD-10-CM | POA: Diagnosis not present

## 2020-09-13 DIAGNOSIS — M1990 Unspecified osteoarthritis, unspecified site: Secondary | ICD-10-CM | POA: Diagnosis not present

## 2020-09-13 DIAGNOSIS — M109 Gout, unspecified: Secondary | ICD-10-CM | POA: Diagnosis not present

## 2020-09-13 DIAGNOSIS — K219 Gastro-esophageal reflux disease without esophagitis: Secondary | ICD-10-CM | POA: Diagnosis not present

## 2020-09-13 DIAGNOSIS — K7460 Unspecified cirrhosis of liver: Secondary | ICD-10-CM | POA: Diagnosis not present

## 2020-09-13 DIAGNOSIS — Z6838 Body mass index (BMI) 38.0-38.9, adult: Secondary | ICD-10-CM | POA: Diagnosis not present

## 2020-09-13 DIAGNOSIS — E1140 Type 2 diabetes mellitus with diabetic neuropathy, unspecified: Secondary | ICD-10-CM | POA: Diagnosis not present

## 2020-09-13 DIAGNOSIS — Z7984 Long term (current) use of oral hypoglycemic drugs: Secondary | ICD-10-CM | POA: Diagnosis not present

## 2020-09-13 DIAGNOSIS — Z9181 History of falling: Secondary | ICD-10-CM | POA: Diagnosis not present

## 2020-09-13 DIAGNOSIS — N400 Enlarged prostate without lower urinary tract symptoms: Secondary | ICD-10-CM | POA: Diagnosis not present

## 2020-09-13 DIAGNOSIS — E785 Hyperlipidemia, unspecified: Secondary | ICD-10-CM | POA: Diagnosis not present

## 2020-09-15 DIAGNOSIS — M109 Gout, unspecified: Secondary | ICD-10-CM | POA: Diagnosis not present

## 2020-09-15 DIAGNOSIS — I69351 Hemiplegia and hemiparesis following cerebral infarction affecting right dominant side: Secondary | ICD-10-CM | POA: Diagnosis not present

## 2020-09-15 DIAGNOSIS — I69322 Dysarthria following cerebral infarction: Secondary | ICD-10-CM | POA: Diagnosis not present

## 2020-09-15 DIAGNOSIS — I10 Essential (primary) hypertension: Secondary | ICD-10-CM | POA: Diagnosis not present

## 2020-09-15 DIAGNOSIS — M1990 Unspecified osteoarthritis, unspecified site: Secondary | ICD-10-CM | POA: Diagnosis not present

## 2020-09-15 DIAGNOSIS — E1140 Type 2 diabetes mellitus with diabetic neuropathy, unspecified: Secondary | ICD-10-CM | POA: Diagnosis not present

## 2020-09-18 DIAGNOSIS — M1990 Unspecified osteoarthritis, unspecified site: Secondary | ICD-10-CM | POA: Diagnosis not present

## 2020-09-18 DIAGNOSIS — I10 Essential (primary) hypertension: Secondary | ICD-10-CM | POA: Diagnosis not present

## 2020-09-18 DIAGNOSIS — M109 Gout, unspecified: Secondary | ICD-10-CM | POA: Diagnosis not present

## 2020-09-18 DIAGNOSIS — E1140 Type 2 diabetes mellitus with diabetic neuropathy, unspecified: Secondary | ICD-10-CM | POA: Diagnosis not present

## 2020-09-18 DIAGNOSIS — I69322 Dysarthria following cerebral infarction: Secondary | ICD-10-CM | POA: Diagnosis not present

## 2020-09-18 DIAGNOSIS — I69351 Hemiplegia and hemiparesis following cerebral infarction affecting right dominant side: Secondary | ICD-10-CM | POA: Diagnosis not present

## 2020-09-18 LAB — CULTURE, URINE COMPREHENSIVE

## 2020-09-20 DIAGNOSIS — E1140 Type 2 diabetes mellitus with diabetic neuropathy, unspecified: Secondary | ICD-10-CM | POA: Diagnosis not present

## 2020-09-20 DIAGNOSIS — I10 Essential (primary) hypertension: Secondary | ICD-10-CM | POA: Diagnosis not present

## 2020-09-20 DIAGNOSIS — M1990 Unspecified osteoarthritis, unspecified site: Secondary | ICD-10-CM | POA: Diagnosis not present

## 2020-09-20 DIAGNOSIS — I69322 Dysarthria following cerebral infarction: Secondary | ICD-10-CM | POA: Diagnosis not present

## 2020-09-20 DIAGNOSIS — M109 Gout, unspecified: Secondary | ICD-10-CM | POA: Diagnosis not present

## 2020-09-20 DIAGNOSIS — I69351 Hemiplegia and hemiparesis following cerebral infarction affecting right dominant side: Secondary | ICD-10-CM | POA: Diagnosis not present

## 2020-09-25 DIAGNOSIS — E1140 Type 2 diabetes mellitus with diabetic neuropathy, unspecified: Secondary | ICD-10-CM | POA: Diagnosis not present

## 2020-09-25 DIAGNOSIS — I69351 Hemiplegia and hemiparesis following cerebral infarction affecting right dominant side: Secondary | ICD-10-CM | POA: Diagnosis not present

## 2020-09-25 DIAGNOSIS — I10 Essential (primary) hypertension: Secondary | ICD-10-CM | POA: Diagnosis not present

## 2020-09-25 DIAGNOSIS — M1990 Unspecified osteoarthritis, unspecified site: Secondary | ICD-10-CM | POA: Diagnosis not present

## 2020-09-25 DIAGNOSIS — I69322 Dysarthria following cerebral infarction: Secondary | ICD-10-CM | POA: Diagnosis not present

## 2020-09-25 DIAGNOSIS — M109 Gout, unspecified: Secondary | ICD-10-CM | POA: Diagnosis not present

## 2020-09-27 DIAGNOSIS — I69351 Hemiplegia and hemiparesis following cerebral infarction affecting right dominant side: Secondary | ICD-10-CM | POA: Diagnosis not present

## 2020-09-27 DIAGNOSIS — M109 Gout, unspecified: Secondary | ICD-10-CM | POA: Diagnosis not present

## 2020-09-27 DIAGNOSIS — M1990 Unspecified osteoarthritis, unspecified site: Secondary | ICD-10-CM | POA: Diagnosis not present

## 2020-09-27 DIAGNOSIS — I69322 Dysarthria following cerebral infarction: Secondary | ICD-10-CM | POA: Diagnosis not present

## 2020-09-27 DIAGNOSIS — E1140 Type 2 diabetes mellitus with diabetic neuropathy, unspecified: Secondary | ICD-10-CM | POA: Diagnosis not present

## 2020-09-27 DIAGNOSIS — I10 Essential (primary) hypertension: Secondary | ICD-10-CM | POA: Diagnosis not present

## 2020-10-02 DIAGNOSIS — M109 Gout, unspecified: Secondary | ICD-10-CM | POA: Diagnosis not present

## 2020-10-02 DIAGNOSIS — I10 Essential (primary) hypertension: Secondary | ICD-10-CM | POA: Diagnosis not present

## 2020-10-02 DIAGNOSIS — I69351 Hemiplegia and hemiparesis following cerebral infarction affecting right dominant side: Secondary | ICD-10-CM | POA: Diagnosis not present

## 2020-10-02 DIAGNOSIS — I69322 Dysarthria following cerebral infarction: Secondary | ICD-10-CM | POA: Diagnosis not present

## 2020-10-02 DIAGNOSIS — E1140 Type 2 diabetes mellitus with diabetic neuropathy, unspecified: Secondary | ICD-10-CM | POA: Diagnosis not present

## 2020-10-02 DIAGNOSIS — M1990 Unspecified osteoarthritis, unspecified site: Secondary | ICD-10-CM | POA: Diagnosis not present

## 2020-10-03 ENCOUNTER — Ambulatory Visit (INDEPENDENT_AMBULATORY_CARE_PROVIDER_SITE_OTHER): Payer: Medicare Other | Admitting: Family Medicine

## 2020-10-03 ENCOUNTER — Other Ambulatory Visit: Payer: Self-pay

## 2020-10-03 ENCOUNTER — Encounter: Payer: Self-pay | Admitting: Family Medicine

## 2020-10-03 VITALS — BP 138/82 | HR 76 | Temp 97.9°F | Resp 16 | Ht 70.0 in | Wt 270.0 lb

## 2020-10-03 DIAGNOSIS — K7460 Unspecified cirrhosis of liver: Secondary | ICD-10-CM | POA: Diagnosis not present

## 2020-10-03 DIAGNOSIS — R269 Unspecified abnormalities of gait and mobility: Secondary | ICD-10-CM

## 2020-10-03 DIAGNOSIS — C8333 Diffuse large B-cell lymphoma, intra-abdominal lymph nodes: Secondary | ICD-10-CM

## 2020-10-03 DIAGNOSIS — D472 Monoclonal gammopathy: Secondary | ICD-10-CM | POA: Diagnosis not present

## 2020-10-03 DIAGNOSIS — E119 Type 2 diabetes mellitus without complications: Secondary | ICD-10-CM | POA: Diagnosis not present

## 2020-10-03 DIAGNOSIS — I639 Cerebral infarction, unspecified: Secondary | ICD-10-CM

## 2020-10-03 NOTE — Progress Notes (Signed)
Established patient visit   Patient: Peter Ryan   DOB: December 22, 1950   70 y.o. Male  MRN: [id number] Visit Date: 10/03/2020  Today's healthcare provider: Wilhemena Durie, MD   Chief Complaint  Patient presents with  . Follow-up   Subjective    HPI  Patient is continues to make slow progress from his pontine CVA.  From rehab.  He is able to walk with a walker for about 30 feet probably.  He has limited use of his right hand.  He is right-handed also.  Swallowing is better but continues to be a little bit of an issue when he does not chew well or eats too quickly. He knows he needs to work on continued weight loss but is unable to get on the scale because of problems with balance. Is upbeat and positive about continuing to get stronger.  He did switch to outpatient physical therapy and occupational therapy going forward.  Wishes to wait on OT at this time. Follow up for Hypertension  The patient was last seen for this 9 weeks ago. Changes made at last visit include continue working on diet and exercise.  He reports excellent compliance with treatment. He feels that condition is Improved. He is not having side effects.   BP Readings from Last 3 Encounters:  10/03/20 138/82  08/29/20 (!) 150/81  08/28/20 (!) 157/84  ----------------------------------------------------------------------------------------- Follow up for left pontine CVA  The patient was last seen for this 9 weeks ago. Changes made at last visit include continue working on PT.  He reports excellent compliance with treatment. He feels that condition is Unchanged. Patient reports still having weakness on right side. He is not having side effects.   -----------------------------------------------------------------------------------------    Patient Active Problem List   Diagnosis Date Noted  . Left pontine cerebrovascular accident ([place]) 06/14/2020  . Facial droop   . Rectal bleeding   . Morbid  obesity ([place])   . Right hemiparesis ([place])   . Controlled type 2 diabetes mellitus with hyperglycemia, without long-term current use of insulin ([place])   . Acute CVA (cerebrovascular accident) ([place]) 06/05/2020  . Obesity, Class III, BMI 40-49.9 (morbid obesity) ([place]) 06/05/2020  . Secondary malignant neoplasm of retroperitoneum and peritoneum ([place]) 05/02/2020  . Atherosclerosis of aorta ([place]) 05/02/2020  . Goals of care, counseling/discussion 04/08/2017  . 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 ([place]) 04/08/2017  . Ascites 02/08/2017  . Liver cirrhosis ([place]) 02/08/2017  . SBP (spontaneous bacterial peritonitis) ([place]) 02/07/2017  . 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  . DOE (dyspnea on exertion) 11/15/2016  . Obesity, morbid (more than 100 lbs over ideal weight or BMI > 40) ([place]) 11/15/2016  . Elevated PSA 05/30/2015  . BPH with obstruction/lower urinary tract symptoms 05/30/2015  . Type 2 diabetes mellitus ([place]) 05/18/2014  . Arthritis 01/03/2014  . Dyslipidemia 01/03/2014  . Benign essential hypertension 01/03/2014  . Gout 01/03/2014  . Psoriasis 01/03/2014  . Reflux 01/03/2014   Social History   Tobacco Use  . Smoking status: Former Smoker    Packs/day: 1.50    Years: 8.00    Pack years: 12.00    Types: Cigarettes    Quit date: 05/29/1974    Years since quitting: 46.3  . Smokeless tobacco: Never Used  . Tobacco comment: quit 40 years ago  Vaping Use  . Vaping Use: Never used  Substance Use Topics  . Alcohol use: No    Alcohol/week: 0.0 standard drinks  Comment: rarely  . Drug use: No   Allergies  Allergen Reactions  . Hctz [Hydrochlorothiazide] Other (See Comments)    Gout flare  . Metformin Other (See Comments)    Stomach upset        Medications: Outpatient Medications Prior to Visit  Medication Sig  . acetaminophen (TYLENOL) 325 MG tablet Take 2 tablets (650 mg total) by mouth every 4 (four) hours as needed  for mild pain (or temp > 37.5 C (99.5 F)).  Marland Kitchen allopurinol (ZYLOPRIM) 300 MG tablet Take 1 tablet (300 mg total) by mouth daily.  Marland Kitchen amLODipine (NORVASC) 10 MG tablet Take 1 tablet (10 mg total) by mouth daily.  Marland Kitchen aspirin EC 325 MG EC tablet Take 1 tablet (325 mg total) by mouth daily.  Marland Kitchen atorvastatin (LIPITOR) 80 MG tablet TAKE ONE TABLET (80 MG) BY MOUTH EVERY DAY  . Cholecalciferol (VITAMIN D3) 25 MCG (1000 UT) CAPS Take 1 capsule (1,000 Units total) by mouth daily.  . cloNIDine (CATAPRES) 0.1 MG tablet Take 1 tablet (0.1 mg total) by mouth 2 (two) times daily.  . finasteride (PROSCAR) 5 MG tablet Take 1 tablet (5 mg total) by mouth daily.  Marland Kitchen glimepiride (AMARYL) 2 MG tablet TAKE 1 TABLET BY MOUTH DAILY WITH BREAKFAST  . hydrocortisone 2.5 % lotion SMARTSIG:Sparingly Topical 3 Times a Week  . losartan (COZAAR) 100 MG tablet TAKE ONE TABLET EVERY DAY BY MOUTH  . polyethylene glycol (MIRALAX / GLYCOLAX) 17 g packet Take 17 g by mouth daily.  . vitamin B-12 (CYANOCOBALAMIN) 1000 MCG tablet Take 1 tablet (1,000 mcg total) by mouth daily.  . [DISCONTINUED] sulfamethoxazole-trimethoprim (BACTRIM DS) 800-160 MG tablet Take 1 tablet by mouth 2 (two) times daily. (Patient not taking: Reported on 10/03/2020)   Facility-Administered Medications Prior to Visit  Medication Dose Route Frequency Provider  . sodium chloride flush (NS) 0.9 % injection 10 mL  10 mL Intravenous PRN Creig Hines, MD    Review of Systems  Constitutional: Negative.   Respiratory: Negative for cough, shortness of breath and wheezing.   Cardiovascular: Negative for chest pain, palpitations and leg swelling.  Neurological: Positive for weakness.    Last metabolic panel Lab Results  Component Value Date   GLUCOSE 124 (H) 07/10/2020   NA 139 07/10/2020   K 4.3 07/10/2020   CL 107 07/10/2020   CO2 20 (L) 07/10/2020   BUN 16 07/10/2020   CREATININE 0.95 07/10/2020   GFRNONAA >60 07/10/2020   GFRAA >60 06/15/2020   CALCIUM  8.5 (L) 07/10/2020   PROT 6.9 08/11/2020   ALBUMIN 3.4 (L) 06/15/2020   LABGLOB 3.5 08/11/2020   AGRATIO 1.8 05/09/2020   BILITOT 1.2 06/15/2020   ALKPHOS 60 06/15/2020   AST 32 06/15/2020   ALT 28 06/15/2020   ANIONGAP 12 07/10/2020   Last lipids Lab Results  Component Value Date   CHOL 170 06/06/2020   HDL 29 (L) 06/06/2020   LDLCALC 77 06/06/2020   TRIG 321 (H) 06/06/2020   CHOLHDL 5.9 06/06/2020      Objective    BP 138/82 (BP Location: Left Arm, Patient Position: Sitting, Cuff Size: Large)   Pulse 76   Temp 97.9 F (36.6 C) (Oral)   Resp 16   Ht 5\' 10"  (1.778 m)   Wt 270 lb (122.5 kg)   SpO2 99%   BMI 38.74 kg/m  BP Readings from Last 3 Encounters:  10/03/20 138/82  08/29/20 (!) 150/81  08/28/20 (!) 157/84   Wt  Readings from Last 3 Encounters:  10/03/20 270 lb (122.5 kg)  09/05/20 270 lb (122.5 kg)  08/11/20 270 lb (122.5 kg)      Physical Exam Vitals reviewed.  Constitutional:      Appearance: He is obese.  HENT:     Head: Normocephalic and atraumatic.     Right Ear: Tympanic membrane, ear canal and external ear normal.     Left Ear: Tympanic membrane, ear canal and external ear normal.     Nose: Nose normal.     Mouth/Throat:     Mouth: Mucous membranes are moist.     Pharynx: Oropharynx is clear.  Eyes:     Extraocular Movements: Extraocular movements intact.     Conjunctiva/sclera: Conjunctivae normal.     Pupils: Pupils are equal, round, and reactive to light.  Cardiovascular:     Rate and Rhythm: Normal rate and regular rhythm.     Pulses: Normal pulses.     Heart sounds: Normal heart sounds.  Pulmonary:     Effort: Pulmonary effort is normal.     Breath sounds: Normal breath sounds.  Abdominal:     General: Bowel sounds are normal.     Palpations: Abdomen is soft.  Musculoskeletal:     Cervical back: Normal range of motion and neck supple.     Comments: Trace to 1+ lower extremity edema  Skin:    General: Skin is warm and dry.   Neurological:     Mental Status: He is alert and oriented to person, place, and time.     Coordination: Coordination abnormal.     Comments: 5-/5+ strength in the right upper extremity. He is sitting in a wheelchair and I did not attempt to ambulate him today.   Psychiatric:        Mood and Affect: Mood normal.        Behavior: Behavior normal.        Thought Content: Thought content normal.        Judgment: Judgment normal.       No results found for any visits on 10/03/20.  Assessment & Plan     1. Left pontine cerebrovascular accident [facility]) Start outpatient physical therapy and consider occupational therapy.  Speech therapy not necessary unless his swallowing worsens. - Ambulatory referral to Physical Therapy  2. 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 ([place]) By oncology  3. MGUS (monoclonal gammopathy of unknown significance) Lab work as ordered by oncology - Kappa/lambda light chains - Protein Electro, Random Urine  4. Type 2 diabetes mellitus without complication, without long-term current use of insulin ([facility]) Continue to try to control A1c as best possible. - Hemoglobin A1c  5. Cirrhosis of liver without ascites, unspecified hepatic cirrhosis type ([place])   6. Gait disturbance From CVA.  PT consult obtained.  7. Morbid obesity ([place]) We will try to [facility] next visit.  I may need to help get him on the scale..  I will see him back in 3 to 4 months.   No follow-ups on file.      I, Wilhemena Durie, MD, have reviewed all documentation for this visit. The documentation on 10/06/20 for the exam, diagnosis, procedures, and orders are all accurate and complete.    Anyely Cunning Cranford Mon, MD  [facility] [phone] (phone) [phone] (fax)  [place]

## 2020-10-04 DIAGNOSIS — M109 Gout, unspecified: Secondary | ICD-10-CM | POA: Diagnosis not present

## 2020-10-04 DIAGNOSIS — M1990 Unspecified osteoarthritis, unspecified site: Secondary | ICD-10-CM | POA: Diagnosis not present

## 2020-10-04 DIAGNOSIS — I10 Essential (primary) hypertension: Secondary | ICD-10-CM | POA: Diagnosis not present

## 2020-10-04 DIAGNOSIS — I69322 Dysarthria following cerebral infarction: Secondary | ICD-10-CM | POA: Diagnosis not present

## 2020-10-04 DIAGNOSIS — E1140 Type 2 diabetes mellitus with diabetic neuropathy, unspecified: Secondary | ICD-10-CM | POA: Diagnosis not present

## 2020-10-04 DIAGNOSIS — I69351 Hemiplegia and hemiparesis following cerebral infarction affecting right dominant side: Secondary | ICD-10-CM | POA: Diagnosis not present

## 2020-10-04 LAB — HEMOGLOBIN A1C
Est. average glucose Bld gHb Est-mCnc: 151 mg/dL
Hgb A1c MFr Bld: 6.9 % — ABNORMAL HIGH (ref 4.8–5.6)

## 2020-10-04 LAB — KAPPA/LAMBDA LIGHT CHAINS
Ig Kappa Free Light Chain: 39.1 mg/L — ABNORMAL HIGH (ref 3.3–19.4)
Ig Lambda Free Light Chain: 16.6 mg/L (ref 5.7–26.3)
KAPPA/LAMBDA RATIO: 2.36 — ABNORMAL HIGH (ref 0.26–1.65)

## 2020-10-04 LAB — PROTEIN ELECTRO, RANDOM URINE
Albumin ELP, Urine: 100 %
Alpha-1-Globulin, U: 0 %
Alpha-2-Globulin, U: 0 %
Beta Globulin, U: 0 %
Gamma Globulin, U: 0 %
Protein, Ur: 17.7 mg/dL

## 2020-10-10 NOTE — Progress Notes (Signed)
Subjective:   Peter Ryan is a 70 y.o. male who presents for an Initial Medicare Annual Wellness Visit.  I connected with Balinda Quails today by telephone and verified that I am speaking with the correct person using two identifiers. Location patient: home Location provider: work Persons participating in the virtual visit: patient, provider.   I discussed the limitations, risks, security and privacy concerns of performing an evaluation and management service by telephone and the availability of in person appointments. I also discussed with the patient that there may be a patient responsible charge related to this service. The patient expressed understanding and verbally consented to this telephonic visit.    Interactive audio and video telecommunications were attempted between this provider and patient, however failed, due to patient having technical difficulties OR patient did not have access to video capability.  We continued and completed visit with audio only.   Review of Systems    N/A        Objective:    There were no vitals filed for this visit. There is no height or weight on file to calculate BMI.  Advanced Directives 08/28/2020 06/14/2020 06/09/2020 06/05/2020 06/05/2020 02/21/2020 01/04/2020  Does Patient Have a Medical Advance Directive? No No No No No No No  Would patient like information on creating a medical advance directive? - No - Guardian declined - No - Guardian declined No - Patient declined No - Patient declined -    Current Medications (verified) Outpatient Encounter Medications as of 10/11/2020  Medication Sig  . acetaminophen (TYLENOL) 325 MG tablet Take 2 tablets (650 mg total) by mouth every 4 (four) hours as needed for mild pain (or temp > 37.5 C (99.5 F)).  Marland Kitchen allopurinol (ZYLOPRIM) 300 MG tablet Take 1 tablet (300 mg total) by mouth daily.  Marland Kitchen amLODipine (NORVASC) 10 MG tablet Take 1 tablet (10 mg total) by mouth daily.  Marland Kitchen aspirin EC 325 MG EC tablet Take  1 tablet (325 mg total) by mouth daily.  Marland Kitchen atorvastatin (LIPITOR) 80 MG tablet TAKE ONE TABLET (80 MG) BY MOUTH EVERY DAY  . Cholecalciferol (VITAMIN D3) 25 MCG (1000 UT) CAPS Take 1 capsule (1,000 Units total) by mouth daily.  . cloNIDine (CATAPRES) 0.1 MG tablet Take 1 tablet (0.1 mg total) by mouth 2 (two) times daily.  . finasteride (PROSCAR) 5 MG tablet Take 1 tablet (5 mg total) by mouth daily.  Marland Kitchen glimepiride (AMARYL) 2 MG tablet TAKE 1 TABLET BY MOUTH DAILY WITH BREAKFAST  . hydrocortisone 2.5 % lotion SMARTSIG:Sparingly Topical 3 Times a Week  . losartan (COZAAR) 100 MG tablet TAKE ONE TABLET EVERY DAY BY MOUTH  . polyethylene glycol (MIRALAX / GLYCOLAX) 17 g packet Take 17 g by mouth daily.  . vitamin B-12 (CYANOCOBALAMIN) 1000 MCG tablet Take 1 tablet (1,000 mcg total) by mouth daily.   Facility-Administered Encounter Medications as of 10/11/2020  Medication  . sodium chloride flush (NS) 0.9 % injection 10 mL    Allergies (verified) Hctz [hydrochlorothiazide] and Metformin   History: Past Medical History:  Diagnosis Date  . Arthritis   . BPH (benign prostatic hyperplasia)   . Cancer ([place])    Non-Hodgkins lymphoma  . Chronic prostatitis   . Cirrhosis of liver ([place])   . Diabetes mellitus without complication ([place])   . Dyslipidemia   . Elevated PSA   . Gastric reflux   . Gout   . HTN (hypertension)   . Neuropathy   . Over weight   .  Psoriasis   . Testicular pain, right   . Urinary frequency    Past Surgical History:  Procedure Laterality Date  . hydrocelectomy    . IR FLUORO GUIDE PORT INSERTION RIGHT  04/11/2017  . IR REMOVAL TUN ACCESS W/ PORT W/O FL MOD SED  05/29/2018  . PILONIDAL CYST EXCISION    . TEE WITHOUT CARDIOVERSION N/A 06/09/2020   Procedure: TRANSESOPHAGEAL ECHOCARDIOGRAM (TEE);  Surgeon: Minna Merritts, MD;  Location: [facility] ORS;  Service: Cardiovascular;  Laterality: N/A;   Family History  Problem Relation Age of Onset  . Uterine cancer Mother    . Diabetes Mother   . Cancer Mother   . Cancer Maternal Uncle   . Bladder Cancer Neg Hx   . Kidney disease Neg Hx   . Prostate cancer Neg Hx    Social History   Socioeconomic History  . Marital status: Married    Spouse name: Peter Ryan  . Number of children: Not on file  . Years of education: Not on file  . Highest education level: Not on file  Occupational History  . Not on file  Tobacco Use  . Smoking status: Former Smoker    Packs/day: 1.50    Years: 8.00    Pack years: 12.00    Types: Cigarettes    Quit date: 05/29/1974    Years since quitting: 46.4  . Smokeless tobacco: Never Used  . Tobacco comment: quit 40 years ago  Vaping Use  . Vaping Use: Never used  Substance and Sexual Activity  . Alcohol use: No    Alcohol/week: 0.0 standard drinks    Comment: rarely  . Drug use: No  . Sexual activity: Not on file  Other Topics Concern  . Not on file  Social History Narrative   Lives with wife   Right handed   Drinks 1-2 cups coffee   Social Determinants of Health   Financial Resource Strain: Not on file  Food Insecurity: Not on file  Transportation Needs: Not on file  Physical Activity: Not on file  Stress: Not on file  Social Connections: Not on file    Tobacco Counseling Counseling given: Not Answered Comment: quit 40 years ago   Clinical Intake:                 Diabetic? Yes  Nutrition Risk Assessment:  Has the patient had any N/V/D within the last 2 months?  No  Does the patient have any non-healing wounds?  No  Has the patient had any unintentional weight loss or weight gain?  No   Diabetes:  Is the patient diabetic?  Yes  If diabetic, was a CBG obtained today?  No  Did the patient bring in their glucometer from home?  No  How often do you monitor your CBG's? Seldomly.   Financial Strains and Diabetes Management:  Are you having any financial strains with the device, your supplies or your medication? No .  Does the patient want  to be seen by Chronic Care Management for management of their diabetes?  No  Would the patient like to be referred to a Nutritionist or for Diabetic Management?  No   Diabetic Exams:  Diabetic Eye Exam: Completed 01/10/20 Diabetic Foot Exam: Overdue, Pt has been advised about the importance in completing this exam.           Activities of Daily Living In your present state of health, do you have any difficulty performing the following activities: 08/02/2020 06/14/2020  Hearing? N N  Vision? N N  Difficulty concentrating or making decisions? N N  Walking or climbing stairs? Y Y  Dressing or bathing? Y N  Doing errands, shopping? Y N  Some recent data might be hidden    Patient Care Team: Maple Hudson., MD as PCP - General (Family Medicine) Erin Fulling, MD as Consulting Physician (Pulmonary Disease) Creig Hines, MD as Consulting Physician (Oncology) Alinda Dooms, NP as Nurse Practitioner (Nurse Practitioner) Peggye Pitt, RN as Oncology Nurse Navigator  Indicate any recent Medical Services you may have received from other than [facility] providers in the past year (date may be approximate).     Assessment:   This is a routine wellness examination for Peter Ryan.  Hearing/Vision screen No exam data present  Dietary issues and exercise activities discussed:    Goals   None    Depression Screen PHQ 2/9 Scores 08/02/2020 07/20/2020 05/02/2020 01/29/2018 12/02/2016 08/22/2015  PHQ - 2 Score 0 2 0 0 3 0  PHQ- 9 Score 0 3 0 5 4 -    Fall Risk Fall Risk  08/02/2020 07/20/2020 05/02/2020 08/01/2017 04/17/2017  Falls in the past year? 1 1 1  No No  Number falls in past yr: 1 0 0 - -  Injury with Fall? 0 0 0 - -  Follow up Falls evaluation completed - - - -    FALL RISK PREVENTION PERTAINING TO THE HOME:  Any stairs in or around the home? Yes  If so, are there any without handrails? No  Home free of loose throw rugs in walkways, pet beds, electrical cords, etc? Yes   Adequate lighting in your home to reduce risk of falls? Yes   ASSISTIVE DEVICES UTILIZED TO PREVENT FALLS:  Life alert? No  Use of a cane, walker or w/c? Yes  Grab bars in the bathroom? Yes  Shower chair or bench in shower? Yes  Elevated toilet seat or a handicapped toilet? Yes    Cognitive Function: Declined today.         Immunizations Immunization History  Administered Date(s) Administered  . Fluad Quad(high Dose 65+) 08/02/2020  . Influenza,inj,Quad PF,6+ Mos 08/20/2017  . Moderna Sars-Covid-2 Vaccination 12/03/2019, 12/31/2019, 09/26/2020  . Tdap 11/21/2014  . Zoster Recombinat (Shingrix) 10/30/2016, 02/21/2017    TDAP status: Up to date  Flu Vaccine status: Up to date  Pneumococcal vaccine status: Due, Education has been provided regarding the importance of this vaccine. Advised may receive this vaccine at local pharmacy or Health Dept. Aware to provide a copy of the vaccination record if obtained from local pharmacy or Health Dept. Verbalized acceptance and understanding.  Covid-19 vaccine status: Completed vaccines  Qualifies for Shingles Vaccine? Yes   Zostavax completed No   Shingrix Completed?: Yes  Screening Tests Health Maintenance  Topic Date Due  . COLONOSCOPY (Pts 45-70yrs Insurance coverage will need to be confirmed)  09/12/2015  . FOOT EXAM  06/26/2017  . PNA vac Low Risk Adult (1 of 2 - PCV13) Never done  . OPHTHALMOLOGY EXAM  01/09/2021  . COVID-19 Vaccine (4 - Booster for Moderna series) 03/27/2021  . HEMOGLOBIN A1C  04/02/2021  . TETANUS/TDAP  11/21/2024  . INFLUENZA VACCINE  Completed  . Hepatitis C Screening  Completed    Health Maintenance  Health Maintenance Due  Topic Date Due  . COLONOSCOPY (Pts 45-46yrs Insurance coverage will need to be confirmed)  09/12/2015  . FOOT EXAM  06/26/2017  . PNA vac Low  Risk Adult (1 of 2 - PCV13) Never done    Colorectal cancer screening: No longer required.   Lung Cancer Screening: (Low Dose  CT Chest recommended if Age 55-80 years, 30 pack-year currently smoking OR have quit w/in 15years.) does not qualify.   Additional Screening:  Hepatitis C Screening: Up to date  Vision Screening: Recommended annual ophthalmology exams for early detection of glaucoma and other disorders of the eye. Is the patient up to date with their annual eye exam?  Yes  Who is the provider or what is the name of the office in which the patient attends annual eye exams? Dr Burnard Hawthorne If pt is not established with a provider, would they like to be referred to a provider to establish care? No .   Dental Screening: Recommended annual dental exams for proper oral hygiene  Community Resource Referral / Chronic Care Management: CRR required this visit?  No   CCM required this visit?  No      Plan:     I have personally reviewed and noted the following in the patient's chart:   . Medical and social history . Use of alcohol, tobacco or illicit drugs  . Current medications and supplements . Functional ability and status . Nutritional status . Physical activity . Advanced directives . List of other physicians . Hospitalizations, surgeries, and ER visits in previous 12 months . Vitals . Screenings to include cognitive, depression, and falls . Referrals and appointments  In addition, I have reviewed and discussed with patient certain preventive protocols, quality metrics, and best practice recommendations. A written personalized care plan for preventive services as well as general preventive health recommendations were provided to patient.     Peter Ryan, [place]   [id number]   Nurse Notes: Pt needs a diabetic foot exam at next in office apt. Pt is unsure if he can have a colonoscopy post stroke. Pt to speak with GI regarding if referral is needed. Pt would like to receive a Prevnar 13 vaccine at next in office apt.

## 2020-10-11 ENCOUNTER — Other Ambulatory Visit: Payer: Self-pay

## 2020-10-11 ENCOUNTER — Ambulatory Visit (INDEPENDENT_AMBULATORY_CARE_PROVIDER_SITE_OTHER): Payer: Medicare Other

## 2020-10-11 DIAGNOSIS — E1140 Type 2 diabetes mellitus with diabetic neuropathy, unspecified: Secondary | ICD-10-CM | POA: Diagnosis not present

## 2020-10-11 DIAGNOSIS — Z0000 Encounter for general adult medical examination without abnormal findings: Secondary | ICD-10-CM | POA: Diagnosis not present

## 2020-10-11 DIAGNOSIS — I10 Essential (primary) hypertension: Secondary | ICD-10-CM | POA: Diagnosis not present

## 2020-10-11 DIAGNOSIS — I69322 Dysarthria following cerebral infarction: Secondary | ICD-10-CM | POA: Diagnosis not present

## 2020-10-11 DIAGNOSIS — M1990 Unspecified osteoarthritis, unspecified site: Secondary | ICD-10-CM | POA: Diagnosis not present

## 2020-10-11 DIAGNOSIS — I69351 Hemiplegia and hemiparesis following cerebral infarction affecting right dominant side: Secondary | ICD-10-CM | POA: Diagnosis not present

## 2020-10-11 DIAGNOSIS — M109 Gout, unspecified: Secondary | ICD-10-CM | POA: Diagnosis not present

## 2020-10-11 NOTE — Patient Instructions (Signed)
Mr. Peter Ryan , Thank you for taking time to come for your Medicare Wellness Visit. I appreciate your ongoing commitment to your health goals. Please review the following plan we discussed and let me know if I can assist you in the future.   Screening recommendations/referrals: Colonoscopy: Currently due, patient to speak with Dr Vicente Males and see if prodcedure is necessary.  Recommended yearly ophthalmology/optometry visit for glaucoma screening and checkup Recommended yearly dental visit for hygiene and checkup  Vaccinations: Influenza vaccine: Done 08/02/20 Pneumococcal vaccine: Prevnar 13 due Tdap vaccine: Up to date, due 10/2024 Shingles vaccine: Completed series    Advanced directives: Advance directive discussed with you today. Even though you declined this today please call our office should you change your mind and we can give you the proper paperwork for you to fill out.  Conditions/risks identified: Fall risk preventatives discussed today.   Next appointment: 01/31/21 @ 2:20 PM with Dr Rosanna Randy   Preventive Care 68 Years and Older, Male Preventive care refers to lifestyle choices and visits with your health care provider that can promote health and wellness. What does preventive care include?  A yearly physical exam. This is also called an annual well check.  Dental exams once or twice a year.  Routine eye exams. Ask your health care provider how often you should have your eyes checked.  Personal lifestyle choices, including:  Daily care of your teeth and gums.  Regular physical activity.  Eating a healthy diet.  Avoiding tobacco and drug use.  Limiting alcohol use.  Practicing safe sex.  Taking low doses of aspirin every day.  Taking vitamin and mineral supplements as recommended by your health care provider. What happens during an annual well check? The services and screenings done by your health care provider during your annual well check will depend on your age,  overall health, lifestyle risk factors, and family history of disease. Counseling  Your health care provider may ask you questions about your:  Alcohol use.  Tobacco use.  Drug use.  Emotional well-being.  Home and relationship well-being.  Sexual activity.  Eating habits.  History of falls.  Memory and ability to understand (cognition).  Work and work Statistician. Screening  You may have the following tests or measurements:  Height, weight, and BMI.  Blood pressure.  Lipid and cholesterol levels. These may be checked every 5 years, or more frequently if you are over 41 years old.  Skin check.  Lung cancer screening. You may have this screening every year starting at age 5 if you have a 30-pack-year history of smoking and currently smoke or have quit within the past 15 years.  Fecal occult blood test (FOBT) of the stool. You may have this test every year starting at age 15.  Flexible sigmoidoscopy or colonoscopy. You may have a sigmoidoscopy every 5 years or a colonoscopy every 10 years starting at age 40.  Prostate cancer screening. Recommendations will vary depending on your family history and other risks.  Hepatitis C blood test.  Hepatitis B blood test.  Sexually transmitted disease (STD) testing.  Diabetes screening. This is done by checking your blood sugar (glucose) after you have not eaten for a while (fasting). You may have this done every 1-3 years.  Abdominal aortic aneurysm (AAA) screening. You may need this if you are a current or former smoker.  Osteoporosis. You may be screened starting at age 49 if you are at high risk. Talk with your health care provider about your test  results, treatment options, and if necessary, the need for more tests. Vaccines  Your health care provider may recommend certain vaccines, such as:  Influenza vaccine. This is recommended every year.  Tetanus, diphtheria, and acellular pertussis (Tdap, Td) vaccine. You may  need a Td booster every 10 years.  Zoster vaccine. You may need this after age 55.  Pneumococcal 13-valent conjugate (PCV13) vaccine. One dose is recommended after age 89.  Pneumococcal polysaccharide (PPSV23) vaccine. One dose is recommended after age 44. Talk to your health care provider about which screenings and vaccines you need and how often you need them. This information is not intended to replace advice given to you by your health care provider. Make sure you discuss any questions you have with your health care provider. Document Released: 10/13/2015 Document Revised: 06/05/2016 Document Reviewed: 07/18/2015 Elsevier Interactive Patient Education  2017 [place] Prevention in the Home Falls can cause injuries. They can happen to people of all ages. There are many things you can do to make your home safe and to help prevent falls. What can I do on the outside of my home?  Regularly fix the edges of walkways and driveways and fix any cracks.  Remove anything that might make you trip as you walk through a door, such as a raised step or threshold.  Trim any bushes or trees on the path to your home.  Use bright outdoor lighting.  Clear any walking paths of anything that might make someone trip, such as rocks or tools.  Regularly check to see if handrails are loose or broken. Make sure that both sides of any steps have handrails.  Any raised decks and porches should have guardrails on the edges.  Have any leaves, snow, or ice cleared regularly.  Use sand or salt on walking paths during winter.  Clean up any spills in your garage right away. This includes oil or grease spills. What can I do in the bathroom?  Use night lights.  Install grab bars by the toilet and in the tub and shower. Do not use towel bars as grab bars.  Use non-skid mats or decals in the tub or shower.  If you need to sit down in the shower, use a plastic, non-slip stool.  Keep the floor  dry. Clean up any water that spills on the floor as soon as it happens.  Remove soap buildup in the tub or shower regularly.  Attach bath mats securely with double-sided non-slip rug tape.  Do not have throw rugs and other things on the floor that can make you trip. What can I do in the bedroom?  Use night lights.  Make sure that you have a light by your bed that is easy to reach.  Do not use any sheets or blankets that are too big for your bed. They should not hang down onto the floor.  Have a firm chair that has side arms. You can use this for support while you get dressed.  Do not have throw rugs and other things on the floor that can make you trip. What can I do in the kitchen?  Clean up any spills right away.  Avoid walking on wet floors.  Keep items that you use a lot in easy-to-reach places.  If you need to reach something above you, use a strong step stool that has a grab bar.  Keep electrical cords out of the way.  Do not use floor polish or wax that makes  floors slippery. If you must use wax, use non-skid floor wax.  Do not have throw rugs and other things on the floor that can make you trip. What can I do with my stairs?  Do not leave any items on the stairs.  Make sure that there are handrails on both sides of the stairs and use them. Fix handrails that are broken or loose. Make sure that handrails are as long as the stairways.  Check any carpeting to make sure that it is firmly attached to the stairs. Fix any carpet that is loose or worn.  Avoid having throw rugs at the top or bottom of the stairs. If you do have throw rugs, attach them to the floor with carpet tape.  Make sure that you have a light switch at the top of the stairs and the bottom of the stairs. If you do not have them, ask someone to add them for you. What else can I do to help prevent falls?  Wear shoes that:  Do not have high heels.  Have rubber bottoms.  Are comfortable and fit you  well.  Are closed at the toe. Do not wear sandals.  If you use a stepladder:  Make sure that it is fully opened. Do not climb a closed stepladder.  Make sure that both sides of the stepladder are locked into place.  Ask someone to hold it for you, if possible.  Clearly mark and make sure that you can see:  Any grab bars or handrails.  First and last steps.  Where the edge of each step is.  Use tools that help you move around (mobility aids) if they are needed. These include:  Canes.  Walkers.  Scooters.  Crutches.  Turn on the lights when you go into a dark area. Replace any light bulbs as soon as they burn out.  Set up your furniture so you have a clear path. Avoid moving your furniture around.  If any of your floors are uneven, fix them.  If there are any pets around you, be aware of where they are.  Review your medicines with your doctor. Some medicines can make you feel dizzy. This can increase your chance of falling. Ask your doctor what other things that you can do to help prevent falls. This information is not intended to replace advice given to you by your health care provider. Make sure you discuss any questions you have with your health care provider. Document Released: 07/13/2009 Document Revised: 02/22/2016 Document Reviewed: 10/21/2014 Elsevier Interactive Patient Education  2017 [employer].

## 2020-10-13 DIAGNOSIS — N400 Enlarged prostate without lower urinary tract symptoms: Secondary | ICD-10-CM | POA: Diagnosis not present

## 2020-10-13 DIAGNOSIS — I69322 Dysarthria following cerebral infarction: Secondary | ICD-10-CM | POA: Diagnosis not present

## 2020-10-13 DIAGNOSIS — Z7984 Long term (current) use of oral hypoglycemic drugs: Secondary | ICD-10-CM | POA: Diagnosis not present

## 2020-10-13 DIAGNOSIS — C8590 Non-Hodgkin lymphoma, unspecified, unspecified site: Secondary | ICD-10-CM | POA: Diagnosis not present

## 2020-10-13 DIAGNOSIS — E785 Hyperlipidemia, unspecified: Secondary | ICD-10-CM | POA: Diagnosis not present

## 2020-10-13 DIAGNOSIS — Z7982 Long term (current) use of aspirin: Secondary | ICD-10-CM | POA: Diagnosis not present

## 2020-10-13 DIAGNOSIS — K219 Gastro-esophageal reflux disease without esophagitis: Secondary | ICD-10-CM | POA: Diagnosis not present

## 2020-10-13 DIAGNOSIS — Z792 Long term (current) use of antibiotics: Secondary | ICD-10-CM | POA: Diagnosis not present

## 2020-10-13 DIAGNOSIS — M109 Gout, unspecified: Secondary | ICD-10-CM | POA: Diagnosis not present

## 2020-10-13 DIAGNOSIS — Z9181 History of falling: Secondary | ICD-10-CM | POA: Diagnosis not present

## 2020-10-13 DIAGNOSIS — Z6838 Body mass index (BMI) 38.0-38.9, adult: Secondary | ICD-10-CM | POA: Diagnosis not present

## 2020-10-13 DIAGNOSIS — E1140 Type 2 diabetes mellitus with diabetic neuropathy, unspecified: Secondary | ICD-10-CM | POA: Diagnosis not present

## 2020-10-13 DIAGNOSIS — I10 Essential (primary) hypertension: Secondary | ICD-10-CM | POA: Diagnosis not present

## 2020-10-13 DIAGNOSIS — K7460 Unspecified cirrhosis of liver: Secondary | ICD-10-CM | POA: Diagnosis not present

## 2020-10-13 DIAGNOSIS — I69351 Hemiplegia and hemiparesis following cerebral infarction affecting right dominant side: Secondary | ICD-10-CM | POA: Diagnosis not present

## 2020-10-13 DIAGNOSIS — M1990 Unspecified osteoarthritis, unspecified site: Secondary | ICD-10-CM | POA: Diagnosis not present

## 2020-10-13 DIAGNOSIS — Z87891 Personal history of nicotine dependence: Secondary | ICD-10-CM | POA: Diagnosis not present

## 2020-10-18 DIAGNOSIS — I69351 Hemiplegia and hemiparesis following cerebral infarction affecting right dominant side: Secondary | ICD-10-CM | POA: Diagnosis not present

## 2020-10-18 DIAGNOSIS — I10 Essential (primary) hypertension: Secondary | ICD-10-CM | POA: Diagnosis not present

## 2020-10-18 DIAGNOSIS — I69322 Dysarthria following cerebral infarction: Secondary | ICD-10-CM | POA: Diagnosis not present

## 2020-10-18 DIAGNOSIS — E1140 Type 2 diabetes mellitus with diabetic neuropathy, unspecified: Secondary | ICD-10-CM | POA: Diagnosis not present

## 2020-10-18 DIAGNOSIS — M109 Gout, unspecified: Secondary | ICD-10-CM | POA: Diagnosis not present

## 2020-10-18 DIAGNOSIS — M1990 Unspecified osteoarthritis, unspecified site: Secondary | ICD-10-CM | POA: Diagnosis not present

## 2020-10-25 DIAGNOSIS — M109 Gout, unspecified: Secondary | ICD-10-CM | POA: Diagnosis not present

## 2020-10-25 DIAGNOSIS — M1990 Unspecified osteoarthritis, unspecified site: Secondary | ICD-10-CM | POA: Diagnosis not present

## 2020-10-25 DIAGNOSIS — I69351 Hemiplegia and hemiparesis following cerebral infarction affecting right dominant side: Secondary | ICD-10-CM | POA: Diagnosis not present

## 2020-10-25 DIAGNOSIS — I10 Essential (primary) hypertension: Secondary | ICD-10-CM | POA: Diagnosis not present

## 2020-10-25 DIAGNOSIS — E1140 Type 2 diabetes mellitus with diabetic neuropathy, unspecified: Secondary | ICD-10-CM | POA: Diagnosis not present

## 2020-10-25 DIAGNOSIS — I69322 Dysarthria following cerebral infarction: Secondary | ICD-10-CM | POA: Diagnosis not present

## 2020-10-31 ENCOUNTER — Ambulatory Visit: Payer: Medicare Other | Admitting: Physical Medicine & Rehabilitation

## 2020-10-31 DIAGNOSIS — E1140 Type 2 diabetes mellitus with diabetic neuropathy, unspecified: Secondary | ICD-10-CM | POA: Diagnosis not present

## 2020-10-31 DIAGNOSIS — I69322 Dysarthria following cerebral infarction: Secondary | ICD-10-CM | POA: Diagnosis not present

## 2020-10-31 DIAGNOSIS — I10 Essential (primary) hypertension: Secondary | ICD-10-CM | POA: Diagnosis not present

## 2020-10-31 DIAGNOSIS — M109 Gout, unspecified: Secondary | ICD-10-CM | POA: Diagnosis not present

## 2020-10-31 DIAGNOSIS — I69351 Hemiplegia and hemiparesis following cerebral infarction affecting right dominant side: Secondary | ICD-10-CM | POA: Diagnosis not present

## 2020-10-31 DIAGNOSIS — M1990 Unspecified osteoarthritis, unspecified site: Secondary | ICD-10-CM | POA: Diagnosis not present

## 2020-11-01 ENCOUNTER — Telehealth: Payer: Self-pay

## 2020-11-01 NOTE — Telephone Encounter (Signed)
Copied from [place] [phone]. Topic: General - Other >> Nov 01, 2020 11:39 AM Alanda Slim E wrote: Reason for CRM: Pt was suppose to have an order for out patient PT with [facility] on [address] / they called but the office told them they do not have an order from Dr. Rosanna Randy please advise asap

## 2020-11-02 NOTE — Telephone Encounter (Signed)
This order was placed on 10/03/20, can you check on it please Thanks

## 2020-11-02 NOTE — Telephone Encounter (Signed)
The [facility] because of diagnosis.They do not treat diagnosis given at he [facility] location from what I was told. Pt was given this information and contact inormation

## 2020-11-09 DIAGNOSIS — Z23 Encounter for immunization: Secondary | ICD-10-CM | POA: Diagnosis not present

## 2020-11-22 ENCOUNTER — Ambulatory Visit: Payer: Medicare Other | Attending: Family Medicine

## 2020-11-22 ENCOUNTER — Other Ambulatory Visit: Payer: Self-pay

## 2020-11-22 DIAGNOSIS — R2689 Other abnormalities of gait and mobility: Secondary | ICD-10-CM | POA: Insufficient documentation

## 2020-11-22 DIAGNOSIS — R2681 Unsteadiness on feet: Secondary | ICD-10-CM | POA: Insufficient documentation

## 2020-11-22 DIAGNOSIS — M6281 Muscle weakness (generalized): Secondary | ICD-10-CM | POA: Insufficient documentation

## 2020-11-22 DIAGNOSIS — I639 Cerebral infarction, unspecified: Secondary | ICD-10-CM | POA: Diagnosis not present

## 2020-11-22 NOTE — Therapy (Signed)
[place] MAIN [facility] SERVICES [address] [place], [place], [address] Phone: [phone]   Fax:  [phone]  Physical Therapy Treatment  Patient Details  Name: Peter Ryan MRN: [id number] Date of Birth: May 24, 1951 Referring Provider (PT): Miguel Aschoff, MD   Encounter Date: 11/22/2020   PT End of Session - 11/22/20 1120    Visit Number 1    Number of Visits 17    Date for PT Re-Evaluation 01/17/21    PT Start Time 1102    PT Stop Time 1158    PT Time Calculation (min) 56 min    Equipment Utilized During Treatment Gait belt    Activity Tolerance Patient tolerated treatment well    Behavior During Therapy [facility] for tasks assessed/performed           Past Medical History:  Diagnosis Date  . Arthritis   . BPH (benign prostatic hyperplasia)   . Cancer ([place])    Non-Hodgkins lymphoma  . Chronic prostatitis   . Cirrhosis of liver ([place])   . Diabetes mellitus without complication ([place])   . Dyslipidemia   . Elevated PSA   . Gastric reflux   . Gout   . HTN (hypertension)   . Hyperlipidemia   . Neuropathy   . Over weight   . Psoriasis   . Testicular pain, right   . Urinary frequency     Past Surgical History:  Procedure Laterality Date  . hydrocelectomy    . IR FLUORO GUIDE PORT INSERTION RIGHT  04/11/2017  . IR REMOVAL TUN ACCESS W/ PORT W/O FL MOD SED  05/29/2018  . PILONIDAL CYST EXCISION    . TEE WITHOUT CARDIOVERSION N/A 06/09/2020   Procedure: TRANSESOPHAGEAL ECHOCARDIOGRAM (TEE);  Surgeon: Minna Merritts, MD;  Location: [facility] ORS;  Service: Cardiovascular;  Laterality: N/A;    There were no vitals filed for this visit.   Subjective Assessment - 11/22/20 1109    Subjective The patient reports in September 2021 he had a stroke and since then he has been in PT since at different facilities and also at home.  The patient reports he needs to learn to walk again and be more independent and the right side still has not  recovered.  The patient reports weakness, decreased balance and uses a walker at home.  The patient reports he does not do exercises at home.    Patient is accompained by: Family member    Pertinent History HTN, Diabetes, neuropathy, cancer; non hodgkins lymphoma, arthritis    Limitations Sitting;Lifting;Standing;Walking;House hold activities    How long can you sit comfortably? unrestricted    How long can you stand comfortably? 5 mins    How long can you walk comfortably? 5 mins    Patient Stated Goals walk again and be more independent    Currently in Pain? No/denies    Pain Score 0-No pain              [facility] PT Assessment - 11/22/20 0001      Assessment   Medical Diagnosis CVA    Referring Provider (PT) Miguel Aschoff, MD    Hand Dominance Right    Prior Therapy Yes      Balance Screen   Has the patient fallen in the past 6 months No    Has the patient had a decrease in activity level because of a fear of falling?  Yes    Is the patient reluctant to leave their home because of  a fear of falling?  Yes      [place] Private residence    Living Arrangements Spouse/significant other    Type of [place] entrance    [place] One level      Cognition   Overall Cognitive Status Within Functional Limits for tasks assessed          Vitals BP 144/75 SpO2 100% HR  79    PAIN: 0/10   POSTURE: seated no deficitis   AROM: Bilateral LE WFL  STRENGTH:  Graded on a 0-5 scale Muscle Group Left Right  Hip Flex 3+/5 2/5  Hip Abd 4/5 4-/5  Hip Add 4/5 4-/5  Hip Ext 4/5 4-/5  Hip IR/ER 4/5 4/5  Knee Flex 5/5 4-/5  Knee Ext 5/5 4-/5  Ankle DF 5/5 4-/5  Ankle PF 5/5 4-/5   SENSATION:  BUE :  BLE :   NEUROLOGICAL SCREEN: (2+ unless otherwise noted.) N=normal  Ab=abnormal   Level Dermatome R L  L2 Medial thigh/groin N N  L3 Lower thigh/med.knee N N  L4 Medial leg/lat thigh N N  L5 Lat. leg & dorsal foot  N N  S1 post/lat foot/thigh/leg N N  S2 Post./med. thigh & leg N N    SOMATOSENSORY:  Any N & T in extremities or weakness: reports :         Sensation           Intact      Diminished         Absent  Light touch LEs                              COORDINATION: .         Heel Shin Slide Test:  Lacks rom on right due to weakness able to get heel to shin but not slide   SPECIAL TESTS: Vision:  WNL  FUNCTIONAL MOBILITY:   BALANCE: Static Sitting Balance  Normal Able to maintain balance against maximal resistance X  Good Able to maintain balance against moderate resistance   Good-/Fair+ Accepts minimal resistance   Fair Able to sit unsupported without balance loss and without UE support   Poor+ Able to maintain with Minimal assistance from individual or chair   Poor Unable to maintain balance-requires mod/max support from individual or chair    Static Standing Balance  Normal Able to maintain standing balance against maximal resistance   Good Able to maintain standing balance against moderate resistance   Good-/Fair+ Able to maintain standing balance against minimal resistance X  Fair Able to stand unsupported without UE support and without LOB for 1-2 min   Fair- Requires Min A and UE support to maintain standing without loss of balance   Poor+ Requires mod A and UE support to maintain standing without loss of balance   Poor Requires max A and UE support to maintain standing balance without loss    Dynamic Sitting Balance  Normal Able to sit unsupported and weight shift across midline maximally   Good Able to sit unsupported and weight shift across midline moderately X  Good-/Fair+ Able to sit unsupported and weight shift across midline minimally   Fair Minimal weight shifting ipsilateral/front, difficulty crossing midline   Fair- Reach to ipsilateral side and unable to weight shift   Poor + Able to sit unsupported with min A and reach to  ipsilateral side, unable to weight  shift   Poor Able to sit unsupported with mod A and reach ipsilateral/front-can't cross midline    Standing Dynamic Balance  Normal Stand independently unsupported, able to weight shift and cross midline maximally   Good Stand independently unsupported, able to weight shift and cross midline moderately   Good-/Fair+ Stand independently unsupported, able to weight shift across midline minimally   Fair Stand independently unsupported, weight shift, and reach ipsilaterally, loss of balance when crossing midline X  Poor+ Able to stand with Min A and reach ipsilaterally, unable to weight shift   Poor Able to stand with Mod A and minimally reach ipsilaterally, unable to cross midline.      GAIT:   Patient ambulating with RW, decreased heel strike on right at initial contact and decreased swing  OUTCOME MEASURES: TEST Outcome Interpretation  5 times sit<>stand 45.82 sec >60 yo, >15 sec indicates increased risk for falls  10 meter walk test                .25 m/s <1.0 m/s indicates increased risk for falls; limited community ambulator  FOTO 12% 37% goal                   PT Education - 11/22/20 1241    Education Details goals, POC    Person(s) Educated Patient    Methods Explanation    Comprehension Verbalized understanding            PT Short Term Goals - 11/22/20 1121      PT SHORT TERM GOAL #1   Title Patient will be independent in home exercise program to improve strength/mobility for better functional independence with ADLs.    Status New    Target Date 01/17/21             PT Long Term Goals - 11/22/20 1122      PT LONG TERM GOAL #1   Title Patient will increase FOTO score to equal to or greater than 37% to demonstrate statistically significant improvement in mobility and quality of life.    Baseline 11/22/20  12%    Status New    Target Date 01/17/21      PT LONG TERM GOAL #2   Title Patient (> 46 years old) will complete five times sit to stand test  in < 15 seconds indicating an increased LE strength and improved balance.    Baseline 11/22/20 45.82 seconds    Status New    Target Date 01/17/21      PT LONG TERM GOAL #3   Title Patient will increase 10 meter walk test to >1.28m/s as to improve gait speed for better community ambulation and to reduce fall risk.    Baseline 11/22/20  .25 m/s with walker    Status New    Target Date 01/17/21      PT LONG TERM GOAL #4   Title Patient will increase ABC scale score >50% to demonstrate better functional mobility and better confidence with ADLs.    Baseline 11/22/20  10%    Status New    Target Date 01/17/21      PT LONG TERM GOAL #5   Title --    Status New    Target Date --                 Plan - 11/22/20 1242    Clinical Impression Statement The patient is a pleasant 70 year old male  presenting s/p left pontine CVA with residual right sided weakness and diminished balance.  The patient presents with decreased walking speed and decreased ability to perform sit to stand.  The patient presents with right LE weakness evident with tasks and during ambulation with RW.  The patient continues to benefit from additional skilled PT services to improve balance, decrease fall risk and improve strength for improved independence with ADLs and decreased fall risk.    Personal Factors and Comorbidities Comorbidity 3+    Examination-Activity Limitations Bathing;Dressing;Transfers;Bed Mobility;Squat;Locomotion Level;Stairs;Carry;Stand    Examination-Participation Restrictions Cleaning;Laundry;Yard Work;Community Activity;Driving;Shop    Stability/Clinical Decision Making Evolving/Moderate complexity    Rehab Potential Good    PT Frequency 2x / week    PT Duration 12 weeks    PT Treatment/Interventions ADLs/Self Care Home Management;Gait training;Stair training;Functional mobility training;Therapeutic activities;Therapeutic exercise;Balance training;Neuromuscular re-education;Patient/family  education;Passive range of motion    PT Next Visit Plan initiate strengthening and balance activities    PT Home Exercise Plan issue next visit    Consulted and Agree with Plan of Care Patient;Family member/caregiver           Patient will benefit from skilled therapeutic intervention in order to improve the following deficits and impairments:  Abnormal gait,Cardiopulmonary status limiting activity,Decreased activity tolerance,Decreased balance,Decreased endurance,Difficulty walking,Decreased mobility,Decreased range of motion,Improper body mechanics,Decreased strength,Impaired flexibility,Postural dysfunction  Visit Diagnosis: Left pontine cerebrovascular accident ([place])  Unsteadiness on feet  Other abnormalities of gait and mobility  Muscle weakness (generalized)     Problem List Patient Active Problem List   Diagnosis Date Noted  . Left pontine cerebrovascular accident ([place]) 06/14/2020  . Facial droop   . Rectal bleeding   . Morbid obesity ([place])   . Right hemiparesis ([place])   . Controlled type 2 diabetes mellitus with hyperglycemia, without long-term current use of insulin ([place])   . Acute CVA (cerebrovascular accident) ([place]) 06/05/2020  . Obesity, Class III, BMI 40-49.9 (morbid obesity) ([place]) 06/05/2020  . Secondary malignant neoplasm of retroperitoneum and peritoneum ([place]) 05/02/2020  . Atherosclerosis of aorta ([place]) 05/02/2020  . Goals of care, counseling/discussion 04/08/2017  . 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 ([place]) 04/08/2017  . Ascites 02/08/2017  . Liver cirrhosis ([place]) 02/08/2017  . SBP (spontaneous bacterial peritonitis) ([place]) 02/07/2017  . 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  . DOE (dyspnea on exertion) 11/15/2016  . Obesity, morbid (more than 100 lbs over ideal weight or BMI > 40) ([place]) 11/15/2016  . Elevated PSA 05/30/2015  . BPH with obstruction/lower urinary tract symptoms 05/30/2015  . Type 2 diabetes  mellitus ([place]) 05/18/2014  . Arthritis 01/03/2014  . Dyslipidemia 01/03/2014  . Benign essential hypertension 01/03/2014  . Gout 01/03/2014  . Psoriasis 01/03/2014  . Reflux 01/03/2014    Hal Morales PT, DPT 11/22/2020, 12:57 PM  [place] MAIN [facility] SERVICES [address] [place], [place], [address] Phone: [phone]   Fax:  [phone]  Name: Ozil Stettler MRN: [id number] Date of Birth: [birth year]

## 2020-11-27 ENCOUNTER — Other Ambulatory Visit: Payer: Self-pay

## 2020-11-27 ENCOUNTER — Ambulatory Visit: Payer: Medicare Other

## 2020-11-27 DIAGNOSIS — R2689 Other abnormalities of gait and mobility: Secondary | ICD-10-CM

## 2020-11-27 DIAGNOSIS — I639 Cerebral infarction, unspecified: Secondary | ICD-10-CM

## 2020-11-27 DIAGNOSIS — R2681 Unsteadiness on feet: Secondary | ICD-10-CM

## 2020-11-27 DIAGNOSIS — M6281 Muscle weakness (generalized): Secondary | ICD-10-CM

## 2020-11-27 NOTE — Therapy (Signed)
[place] MAIN [facility] SERVICES [address] [place], [place], [address] Phone: [phone]   Fax:  [phone]  Physical Therapy Treatment  Patient Details  Name: Peter Ryan MRN: [id number] Date of Birth: Jan 07, 1951 Referring Provider (PT): Miguel Aschoff, MD   Encounter Date: 11/27/2020   PT End of Session - 11/27/20 1113    Visit Number 2    Number of Visits 17    Date for PT Re-Evaluation 01/17/21    PT Start Time 1105    PT Stop Time 1149    PT Time Calculation (min) 44 min    Equipment Utilized During Treatment Gait belt    Activity Tolerance Patient tolerated treatment well    Behavior During Therapy [facility] for tasks assessed/performed           Past Medical History:  Diagnosis Date  . Arthritis   . BPH (benign prostatic hyperplasia)   . Cancer ([place])    Non-Hodgkins lymphoma  . Chronic prostatitis   . Cirrhosis of liver ([place])   . Diabetes mellitus without complication ([place])   . Dyslipidemia   . Elevated PSA   . Gastric reflux   . Gout   . HTN (hypertension)   . Hyperlipidemia   . Neuropathy   . Over weight   . Psoriasis   . Testicular pain, right   . Urinary frequency     Past Surgical History:  Procedure Laterality Date  . hydrocelectomy    . IR FLUORO GUIDE PORT INSERTION RIGHT  04/11/2017  . IR REMOVAL TUN ACCESS W/ PORT W/O FL MOD SED  05/29/2018  . PILONIDAL CYST EXCISION    . TEE WITHOUT CARDIOVERSION N/A 06/09/2020   Procedure: TRANSESOPHAGEAL ECHOCARDIOGRAM (TEE);  Surgeon: Minna Merritts, MD;  Location: [facility] ORS;  Service: Cardiovascular;  Laterality: N/A;    There were no vitals filed for this visit.   Subjective Assessment - 11/27/20 1111    Subjective The patient reports he has been careful and has not had any stumbles or falls.    Patient is accompained by: Family member    Pertinent History HTN, Diabetes, neuropathy, cancer; non hodgkins lymphoma, arthritis    Limitations  Sitting;Lifting;Standing;Walking;House hold activities    How long can you sit comfortably? unrestricted    How long can you stand comfortably? 5 mins    How long can you walk comfortably? 5 mins    Patient Stated Goals walk again and be more independent    Currently in Pain? No/denies          TREATMENT  SpO2 98% HR 86   Nu step L1 x5 mins   Seated Iso adduction 5"x15 Clamshells with green TB 5"x15 Hamstring curl with green TB 2x15 each Marching 2x15 each LAQ 2x15 each  (issued iso add, clamshells seated, laq and seated march to HEP)  Parallel bars Ambulation forward/backward cueing for posture and sequencing bilateral UE support x3 laps Side stepping bilateral UE support x2 laps cueing on posture                         PT Education - 11/27/20 1118    Education Details exercises, gait,  HEP    Person(s) Educated Patient    Methods Explanation;Handout    Comprehension Verbalized understanding            PT Short Term Goals - 11/22/20 1121      PT SHORT TERM GOAL #1  Title Patient will be independent in home exercise program to improve strength/mobility for better functional independence with ADLs.    Status New    Target Date 01/17/21             PT Long Term Goals - 11/22/20 1122      PT LONG TERM GOAL #1   Title Patient will increase FOTO score to equal to or greater than 37% to demonstrate statistically significant improvement in mobility and quality of life.    Baseline 11/22/20  12%    Status New    Target Date 01/17/21      PT LONG TERM GOAL #2   Title Patient (> 47 years old) will complete five times sit to stand test in < 15 seconds indicating an increased LE strength and improved balance.    Baseline 11/22/20 45.82 seconds    Status New    Target Date 01/17/21      PT LONG TERM GOAL #3   Title Patient will increase 10 meter walk test to >1.70m/s as to improve gait speed for better community ambulation and to reduce fall  risk.    Baseline 11/22/20  .25 m/s with walker    Status New    Target Date 01/17/21      PT LONG TERM GOAL #4   Title Patient will increase ABC scale score >50% to demonstrate better functional mobility and better confidence with ADLs.    Baseline 11/22/20  10%    Status New    Target Date 01/17/21      PT LONG TERM GOAL #5   Title --    Status New    Target Date --                 Plan - 11/27/20 1114    Clinical Impression Statement Initiated strengthening and gait mechanics program.  The patient tolerated treatment well with appropriatel level of muscle fatigue. Issued and reviewed HEP with the patient.  The patient continues to benefit from additional skilled PT services to improve LE strength, balance and gait for improved functional capacity.    Personal Factors and Comorbidities Comorbidity 3+    Examination-Activity Limitations Bathing;Dressing;Transfers;Bed Mobility;Squat;Locomotion Level;Stairs;Carry;Stand    Examination-Participation Restrictions Cleaning;Laundry;Yard Work;Community Activity;Driving;Shop    Stability/Clinical Decision Making Evolving/Moderate complexity    Rehab Potential Good    PT Frequency 2x / week    PT Duration 12 weeks    PT Treatment/Interventions ADLs/Self Care Home Management;Gait training;Stair training;Functional mobility training;Therapeutic activities;Therapeutic exercise;Balance training;Neuromuscular re-education;Patient/family education;Passive range of motion    PT Next Visit Plan initiate strengthening and balance activities    PT Home Exercise Plan issue next visit    Consulted and Agree with Plan of Care Patient;Family member/caregiver           Patient will benefit from skilled therapeutic intervention in order to improve the following deficits and impairments:  Abnormal gait,Cardiopulmonary status limiting activity,Decreased activity tolerance,Decreased balance,Decreased endurance,Difficulty walking,Decreased  mobility,Decreased range of motion,Improper body mechanics,Decreased strength,Impaired flexibility,Postural dysfunction  Visit Diagnosis: Left pontine cerebrovascular accident ([place])  Unsteadiness on feet  Other abnormalities of gait and mobility  Muscle weakness (generalized)     Problem List Patient Active Problem List   Diagnosis Date Noted  . Left pontine cerebrovascular accident ([place]) 06/14/2020  . Facial droop   . Rectal bleeding   . Morbid obesity ([place])   . Right hemiparesis ([place])   . Controlled type 2 diabetes mellitus with hyperglycemia, without long-term current use of  insulin ([place])   . Acute CVA (cerebrovascular accident) ([place]) 06/05/2020  . Obesity, Class III, BMI 40-49.9 (morbid obesity) ([place]) 06/05/2020  . Secondary malignant neoplasm of retroperitoneum and peritoneum ([place]) 05/02/2020  . Atherosclerosis of aorta ([place]) 05/02/2020  . Goals of care, counseling/discussion 04/08/2017  . 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 ([place]) 04/08/2017  . Ascites 02/08/2017  . Liver cirrhosis ([place]) 02/08/2017  . SBP (spontaneous bacterial peritonitis) ([place]) 02/07/2017  . 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  . DOE (dyspnea on exertion) 11/15/2016  . Obesity, morbid (more than 100 lbs over ideal weight or BMI > 40) ([place]) 11/15/2016  . Elevated PSA 05/30/2015  . BPH with obstruction/lower urinary tract symptoms 05/30/2015  . Type 2 diabetes mellitus ([place]) 05/18/2014  . Arthritis 01/03/2014  . Dyslipidemia 01/03/2014  . Benign essential hypertension 01/03/2014  . Gout 01/03/2014  . Psoriasis 01/03/2014  . Reflux 01/03/2014    Hal Morales PT, DPT 11/27/2020, 11:52 AM  [place] MAIN [facility] SERVICES [address] [place], [place], [address] Phone: [phone]   Fax:  [phone]  Name: Hans Rusher MRN: [id number] Date of Birth: Aug 15, 1951

## 2020-11-29 ENCOUNTER — Other Ambulatory Visit: Payer: Self-pay

## 2020-11-29 ENCOUNTER — Ambulatory Visit: Payer: Medicare Other | Attending: Family Medicine

## 2020-11-29 DIAGNOSIS — I639 Cerebral infarction, unspecified: Secondary | ICD-10-CM | POA: Diagnosis not present

## 2020-11-29 DIAGNOSIS — I6503 Occlusion and stenosis of bilateral vertebral arteries: Secondary | ICD-10-CM | POA: Diagnosis not present

## 2020-11-29 DIAGNOSIS — I6782 Cerebral ischemia: Secondary | ICD-10-CM | POA: Diagnosis not present

## 2020-11-29 DIAGNOSIS — I6501 Occlusion and stenosis of right vertebral artery: Secondary | ICD-10-CM | POA: Diagnosis not present

## 2020-11-29 DIAGNOSIS — M6281 Muscle weakness (generalized): Secondary | ICD-10-CM | POA: Insufficient documentation

## 2020-11-29 DIAGNOSIS — I69351 Hemiplegia and hemiparesis following cerebral infarction affecting right dominant side: Secondary | ICD-10-CM | POA: Diagnosis not present

## 2020-11-29 DIAGNOSIS — R531 Weakness: Secondary | ICD-10-CM | POA: Diagnosis not present

## 2020-11-29 DIAGNOSIS — E1165 Type 2 diabetes mellitus with hyperglycemia: Secondary | ICD-10-CM | POA: Diagnosis not present

## 2020-11-29 DIAGNOSIS — I6522 Occlusion and stenosis of left carotid artery: Secondary | ICD-10-CM | POA: Diagnosis not present

## 2020-11-29 DIAGNOSIS — R1110 Vomiting, unspecified: Secondary | ICD-10-CM | POA: Diagnosis not present

## 2020-11-29 DIAGNOSIS — I10 Essential (primary) hypertension: Secondary | ICD-10-CM | POA: Diagnosis not present

## 2020-11-29 DIAGNOSIS — G459 Transient cerebral ischemic attack, unspecified: Secondary | ICD-10-CM | POA: Diagnosis not present

## 2020-11-29 DIAGNOSIS — D472 Monoclonal gammopathy: Secondary | ICD-10-CM | POA: Diagnosis not present

## 2020-11-29 DIAGNOSIS — R29818 Other symptoms and signs involving the nervous system: Secondary | ICD-10-CM | POA: Diagnosis not present

## 2020-11-29 DIAGNOSIS — M79604 Pain in right leg: Secondary | ICD-10-CM | POA: Diagnosis not present

## 2020-11-29 DIAGNOSIS — E1169 Type 2 diabetes mellitus with other specified complication: Secondary | ICD-10-CM | POA: Diagnosis not present

## 2020-11-29 DIAGNOSIS — I6602 Occlusion and stenosis of left middle cerebral artery: Secondary | ICD-10-CM | POA: Diagnosis not present

## 2020-11-29 DIAGNOSIS — Z20822 Contact with and (suspected) exposure to covid-19: Secondary | ICD-10-CM | POA: Diagnosis not present

## 2020-11-29 DIAGNOSIS — R2689 Other abnormalities of gait and mobility: Secondary | ICD-10-CM | POA: Insufficient documentation

## 2020-11-29 NOTE — Therapy (Signed)
[place] MAIN [facility] SERVICES [address] [place], [place], [address] Phone: [phone]   Fax:  [phone]  Physical Therapy Treatment  Patient Details  Name: Peter Ryan MRN: [id number] Date of Birth: [birth year] Referring Provider (PT): Miguel Aschoff, MD   Encounter Date: 11/29/2020   PT End of Session - 11/29/20 1110    Visit Number 3    Number of Visits 17    Date for PT Re-Evaluation 01/17/21    PT Start Time 1104    PT Stop Time 1149    PT Time Calculation (min) 45 min    Equipment Utilized During Treatment Gait belt    Activity Tolerance Patient tolerated treatment well;Patient limited by fatigue    Behavior During Therapy [facility] for tasks assessed/performed           Past Medical History:  Diagnosis Date  . Arthritis   . BPH (benign prostatic hyperplasia)   . Cancer ([place])    Non-Hodgkins lymphoma  . Chronic prostatitis   . Cirrhosis of liver ([place])   . Diabetes mellitus without complication ([place])   . Dyslipidemia   . Elevated PSA   . Gastric reflux   . Gout   . HTN (hypertension)   . Hyperlipidemia   . Neuropathy   . Over weight   . Psoriasis   . Testicular pain, right   . Urinary frequency     Past Surgical History:  Procedure Laterality Date  . hydrocelectomy    . IR FLUORO GUIDE PORT INSERTION RIGHT  04/11/2017  . IR REMOVAL TUN ACCESS W/ PORT W/O FL MOD SED  05/29/2018  . PILONIDAL CYST EXCISION    . TEE WITHOUT CARDIOVERSION N/A 06/09/2020   Procedure: TRANSESOPHAGEAL ECHOCARDIOGRAM (TEE);  Surgeon: Minna Merritts, MD;  Location: [facility] ORS;  Service: Cardiovascular;  Laterality: N/A;    There were no vitals filed for this visit.   Subjective Assessment - 11/29/20 1109    Subjective The patient reports he has not had any falls and is doing good.    Patient is accompained by: Family member    Pertinent History HTN, Diabetes, neuropathy, cancer; non hodgkins lymphoma, arthritis    Limitations  Sitting;Lifting;Standing;Walking;House hold activities    How long can you sit comfortably? unrestricted    How long can you stand comfortably? 5 mins    How long can you walk comfortably? 5 mins    Patient Stated Goals walk again and be more independent    Currently in Pain? No/denies    Pain Score 0-No pain           Nu step L1 x5 mins seat level 11/arms 11   Seated Iso adduction 5"x15 Clamshells with green TB 5"x15 Hamstring curl with green TB 2x15 each Marching 2x15 each LAQ 2x15 each     Parallel bars Ambulation forward/backward cueing for posture and sequencing bilateral UE support x4 laps Side stepping bilateral UE support x2 laps cueing on posture and to lift and place right LE Hurdle step over (step over with left then back) x10 cueing to lift right foot  HR 91 Sp02 98%   Airex WBOS EO 30"x3 cueing to better equalize weight though LEs Airex NBOS EO 30"x3  Tandem stance modified on level surface 30"x2 each foot in front (1x with left foot in back due to fatigue)                      PT Education -  11/29/20 1110    Education Details exercises, gait, balance    Person(s) Educated Patient    Methods Explanation;Demonstration    Comprehension Verbalized understanding;Returned demonstration            PT Short Term Goals - 11/22/20 1121      PT SHORT TERM GOAL #1   Title Patient will be independent in home exercise program to improve strength/mobility for better functional independence with ADLs.    Status New    Target Date 01/17/21             PT Long Term Goals - 11/22/20 1122      PT LONG TERM GOAL #1   Title Patient will increase FOTO score to equal to or greater than 37% to demonstrate statistically significant improvement in mobility and quality of life.    Baseline 11/22/20  12%    Status New    Target Date 01/17/21      PT LONG TERM GOAL #2   Title Patient (> 62 years old) will complete five times sit to stand test in < 15  seconds indicating an increased LE strength and improved balance.    Baseline 11/22/20 45.82 seconds    Status New    Target Date 01/17/21      PT LONG TERM GOAL #3   Title Patient will increase 10 meter walk test to >1.60m/s as to improve gait speed for better community ambulation and to reduce fall risk.    Baseline 11/22/20  .25 m/s with walker    Status New    Target Date 01/17/21      PT LONG TERM GOAL #4   Title Patient will increase ABC scale score >50% to demonstrate better functional mobility and better confidence with ADLs.    Baseline 11/22/20  10%    Status New    Target Date 01/17/21      PT LONG TERM GOAL #5   Title --    Status New    Target Date --                 Plan - 11/29/20 1152    Clinical Impression Statement The patient was fatigued with gait and static balance activities.  The patient with tendency to weightbear less on right LE and had difficulty lifting and placing the same leg.  Cueing needed for exercise technique.  The patient continues to benefit from additional skilled PT services to improve LE strength, gait and balance for improved independence.    Personal Factors and Comorbidities Comorbidity 3+    Examination-Activity Limitations Bathing;Dressing;Transfers;Bed Mobility;Squat;Locomotion Level;Stairs;Carry;Stand    Examination-Participation Restrictions Cleaning;Laundry;Yard Work;Community Activity;Driving;Shop    Stability/Clinical Decision Making Evolving/Moderate complexity    Rehab Potential Good    PT Frequency 2x / week    PT Duration 12 weeks    PT Treatment/Interventions ADLs/Self Care Home Management;Gait training;Stair training;Functional mobility training;Therapeutic activities;Therapeutic exercise;Balance training;Neuromuscular re-education;Patient/family education;Passive range of motion    PT Next Visit Plan initiate strengthening and balance activities    PT Home Exercise Plan issue next visit    Consulted and Agree with Plan  of Care Patient;Family member/caregiver           Patient will benefit from skilled therapeutic intervention in order to improve the following deficits and impairments:  Abnormal gait,Cardiopulmonary status limiting activity,Decreased activity tolerance,Decreased balance,Decreased endurance,Difficulty walking,Decreased mobility,Decreased range of motion,Improper body mechanics,Decreased strength,Impaired flexibility,Postural dysfunction  Visit Diagnosis: Left pontine cerebrovascular accident [facility])  Other abnormalities of gait and  mobility  Muscle weakness (generalized)     Problem List Patient Active Problem List   Diagnosis Date Noted  . Left pontine cerebrovascular accident ([place]) 06/14/2020  . Facial droop   . Rectal bleeding   . Morbid obesity ([place])   . Right hemiparesis ([place])   . Controlled type 2 diabetes mellitus with hyperglycemia, without long-term current use of insulin ([place])   . Acute CVA (cerebrovascular accident) ([place]) 06/05/2020  . Obesity, Class III, BMI 40-49.9 (morbid obesity) ([place]) 06/05/2020  . Secondary malignant neoplasm of retroperitoneum and peritoneum ([place]) 05/02/2020  . Atherosclerosis of aorta ([place]) 05/02/2020  . Goals of care, counseling/discussion 04/08/2017  . 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 ([place]) 04/08/2017  . Ascites 02/08/2017  . Liver cirrhosis ([place]) 02/08/2017  . SBP (spontaneous bacterial peritonitis) ([place]) 02/07/2017  . 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  . DOE (dyspnea on exertion) 11/15/2016  . Obesity, morbid (more than 100 lbs over ideal weight or BMI > 40) ([place]) 11/15/2016  . Elevated PSA 05/30/2015  . BPH with obstruction/lower urinary tract symptoms 05/30/2015  . Type 2 diabetes mellitus ([place]) 05/18/2014  . Arthritis 01/03/2014  . Dyslipidemia 01/03/2014  . Benign essential hypertension 01/03/2014  . Gout 01/03/2014  . Psoriasis 01/03/2014  . Reflux 01/03/2014    Hal Morales PT, DPT 11/29/2020, 11:54 AM  [place] MAIN [facility] SERVICES [address] [place], [place], [address] Phone: [phone]   Fax:  [phone]  Name: Peter Ryan MRN: [id number] Date of Birth: 11/14/1950

## 2020-11-30 ENCOUNTER — Emergency Department: Payer: Medicare Other

## 2020-11-30 ENCOUNTER — Other Ambulatory Visit: Payer: Self-pay

## 2020-11-30 ENCOUNTER — Inpatient Hospital Stay
Admission: EM | Admit: 2020-11-30 | Discharge: 2020-12-04 | DRG: 069 | Disposition: A | Discharge status: Skilled Nursing Facility | Payer: Medicare Other | Attending: Internal Medicine | Admitting: Internal Medicine

## 2020-11-30 ENCOUNTER — Encounter: Payer: Self-pay | Admitting: Internal Medicine

## 2020-11-30 DIAGNOSIS — I6930 Unspecified sequelae of cerebral infarction: Secondary | ICD-10-CM

## 2020-11-30 DIAGNOSIS — R29702 NIHSS score 2: Secondary | ICD-10-CM | POA: Diagnosis present

## 2020-11-30 DIAGNOSIS — E785 Hyperlipidemia, unspecified: Secondary | ICD-10-CM | POA: Diagnosis present

## 2020-11-30 DIAGNOSIS — Z7984 Long term (current) use of oral hypoglycemic drugs: Secondary | ICD-10-CM

## 2020-11-30 DIAGNOSIS — I6602 Occlusion and stenosis of left middle cerebral artery: Secondary | ICD-10-CM | POA: Diagnosis not present

## 2020-11-30 DIAGNOSIS — I6522 Occlusion and stenosis of left carotid artery: Secondary | ICD-10-CM | POA: Diagnosis not present

## 2020-11-30 DIAGNOSIS — R52 Pain, unspecified: Secondary | ICD-10-CM | POA: Diagnosis not present

## 2020-11-30 DIAGNOSIS — I6782 Cerebral ischemia: Secondary | ICD-10-CM | POA: Diagnosis not present

## 2020-11-30 DIAGNOSIS — R112 Nausea with vomiting, unspecified: Secondary | ICD-10-CM

## 2020-11-30 DIAGNOSIS — R531 Weakness: Secondary | ICD-10-CM

## 2020-11-30 DIAGNOSIS — I69351 Hemiplegia and hemiparesis following cerebral infarction affecting right dominant side: Secondary | ICD-10-CM

## 2020-11-30 DIAGNOSIS — I10 Essential (primary) hypertension: Secondary | ICD-10-CM | POA: Diagnosis not present

## 2020-11-30 DIAGNOSIS — M6281 Muscle weakness (generalized): Secondary | ICD-10-CM | POA: Diagnosis not present

## 2020-11-30 DIAGNOSIS — I639 Cerebral infarction, unspecified: Secondary | ICD-10-CM

## 2020-11-30 DIAGNOSIS — R29818 Other symptoms and signs involving the nervous system: Secondary | ICD-10-CM | POA: Diagnosis not present

## 2020-11-30 DIAGNOSIS — I69322 Dysarthria following cerebral infarction: Secondary | ICD-10-CM

## 2020-11-30 DIAGNOSIS — Z8049 Family history of malignant neoplasm of other genital organs: Secondary | ICD-10-CM

## 2020-11-30 DIAGNOSIS — I152 Hypertension secondary to endocrine disorders: Secondary | ICD-10-CM | POA: Diagnosis present

## 2020-11-30 DIAGNOSIS — N400 Enlarged prostate without lower urinary tract symptoms: Secondary | ICD-10-CM | POA: Diagnosis present

## 2020-11-30 DIAGNOSIS — R29810 Facial weakness: Secondary | ICD-10-CM | POA: Diagnosis not present

## 2020-11-30 DIAGNOSIS — Z833 Family history of diabetes mellitus: Secondary | ICD-10-CM

## 2020-11-30 DIAGNOSIS — G459 Transient cerebral ischemic attack, unspecified: Principal | ICD-10-CM | POA: Diagnosis present

## 2020-11-30 DIAGNOSIS — Z20822 Contact with and (suspected) exposure to covid-19: Secondary | ICD-10-CM | POA: Diagnosis present

## 2020-11-30 DIAGNOSIS — I6503 Occlusion and stenosis of bilateral vertebral arteries: Secondary | ICD-10-CM | POA: Diagnosis not present

## 2020-11-30 DIAGNOSIS — E876 Hypokalemia: Secondary | ICD-10-CM | POA: Diagnosis present

## 2020-11-30 DIAGNOSIS — M109 Gout, unspecified: Secondary | ICD-10-CM | POA: Diagnosis present

## 2020-11-30 DIAGNOSIS — I6501 Occlusion and stenosis of right vertebral artery: Secondary | ICD-10-CM | POA: Diagnosis not present

## 2020-11-30 DIAGNOSIS — D472 Monoclonal gammopathy: Secondary | ICD-10-CM | POA: Diagnosis present

## 2020-11-30 DIAGNOSIS — Z79899 Other long term (current) drug therapy: Secondary | ICD-10-CM

## 2020-11-30 DIAGNOSIS — Z7982 Long term (current) use of aspirin: Secondary | ICD-10-CM

## 2020-11-30 DIAGNOSIS — Z9221 Personal history of antineoplastic chemotherapy: Secondary | ICD-10-CM

## 2020-11-30 DIAGNOSIS — Z8572 Personal history of non-Hodgkin lymphomas: Secondary | ICD-10-CM

## 2020-11-30 DIAGNOSIS — Z87891 Personal history of nicotine dependence: Secondary | ICD-10-CM

## 2020-11-30 DIAGNOSIS — R0689 Other abnormalities of breathing: Secondary | ICD-10-CM | POA: Diagnosis not present

## 2020-11-30 DIAGNOSIS — R1110 Vomiting, unspecified: Secondary | ICD-10-CM | POA: Diagnosis not present

## 2020-11-30 DIAGNOSIS — E1169 Type 2 diabetes mellitus with other specified complication: Secondary | ICD-10-CM | POA: Diagnosis present

## 2020-11-30 DIAGNOSIS — M79604 Pain in right leg: Secondary | ICD-10-CM | POA: Diagnosis not present

## 2020-11-30 DIAGNOSIS — E1165 Type 2 diabetes mellitus with hyperglycemia: Secondary | ICD-10-CM | POA: Diagnosis present

## 2020-11-30 LAB — CBC
HCT: 43.4 % (ref 39.0–52.0)
Hemoglobin: 14.7 g/dL (ref 13.0–17.0)
MCH: 27.3 pg (ref 26.0–34.0)
MCHC: 33.9 g/dL (ref 30.0–36.0)
MCV: 80.7 fL (ref 80.0–100.0)
Platelets: 236 10*3/uL (ref 150–400)
RBC: 5.38 MIL/uL (ref 4.22–5.81)
RDW: 15.1 % (ref 11.5–15.5)
WBC: 10.6 10*3/uL — ABNORMAL HIGH (ref 4.0–10.5)
nRBC: 0 % (ref 0.0–0.2)

## 2020-11-30 LAB — COMPREHENSIVE METABOLIC PANEL
ALT: 20 U/L (ref 0–44)
AST: 23 U/L (ref 15–41)
Albumin: 3.8 g/dL (ref 3.5–5.0)
Alkaline Phosphatase: 79 U/L (ref 38–126)
Anion gap: 14 (ref 5–15)
BUN: 11 mg/dL (ref 8–23)
CO2: 24 mmol/L (ref 22–32)
Calcium: 8.5 mg/dL — ABNORMAL LOW (ref 8.9–10.3)
Chloride: 99 mmol/L (ref 98–111)
Creatinine, Ser: 0.79 mg/dL (ref 0.61–1.24)
GFR, Estimated: >60 mL/min (ref >60)
Glucose, Bld: 202 mg/dL — ABNORMAL HIGH (ref 70–99)
Potassium: 3 mmol/L — ABNORMAL LOW (ref 3.5–5.1)
Sodium: 137 mmol/L (ref 135–145)
Total Bilirubin: 0.7 mg/dL (ref 0.3–1.2)
Total Protein: 7.3 g/dL (ref 6.5–8.1)

## 2020-11-30 LAB — URINE DRUG SCREEN, QUALITATIVE (ARMC ONLY)
Amphetamines, Ur Screen: NOT DETECTED
Barbiturates, Ur Screen: NOT DETECTED
Benzodiazepine, Ur Scrn: NOT DETECTED
Cannabinoid 50 Ng, Ur ~~LOC~~: NOT DETECTED
Cocaine Metabolite,Ur ~~LOC~~: NOT DETECTED
MDMA (Ecstasy)Ur Screen: NOT DETECTED
Methadone Scn, Ur: NOT DETECTED
Opiate, Ur Screen: NOT DETECTED
Phencyclidine (PCP) Ur S: NOT DETECTED
Tricyclic, Ur Screen: NOT DETECTED

## 2020-11-30 LAB — RESP PANEL BY RT-PCR (FLU A&B, COVID) ARPGX2
Influenza A by PCR: NEGATIVE
Influenza B by PCR: NEGATIVE
SARS Coronavirus 2 by RT PCR: NEGATIVE

## 2020-11-30 LAB — URINALYSIS, ROUTINE W REFLEX MICROSCOPIC
Bilirubin Urine: NEGATIVE
Glucose, UA: 50 mg/dL — AB
Hgb urine dipstick: NEGATIVE
Ketones, ur: NEGATIVE mg/dL
Leukocytes,Ua: NEGATIVE
Nitrite: NEGATIVE
Protein, ur: >=300 mg/dL — AB
Specific Gravity, Urine: >1.046 — ABNORMAL HIGH (ref 1.005–1.030)
pH: 6 (ref 5.0–8.0)

## 2020-11-30 LAB — PROTIME-INR
INR: 1 (ref 0.8–1.2)
Prothrombin Time: 12.7 seconds (ref 11.4–15.2)

## 2020-11-30 LAB — DIFFERENTIAL
Abs Immature Granulocytes: 0.05 10*3/uL (ref 0.00–0.07)
Basophils Absolute: 0.1 10*3/uL (ref 0.0–0.1)
Basophils Relative: 1 %
Eosinophils Absolute: 0.1 10*3/uL (ref 0.0–0.5)
Eosinophils Relative: 1 %
Immature Granulocytes: 1 %
Lymphocytes Relative: 18 %
Lymphs Abs: 1.9 10*3/uL (ref 0.7–4.0)
Monocytes Absolute: 0.8 10*3/uL (ref 0.1–1.0)
Monocytes Relative: 7 %
Neutro Abs: 7.8 10*3/uL — ABNORMAL HIGH (ref 1.7–7.7)
Neutrophils Relative %: 72 %

## 2020-11-30 LAB — APTT: aPTT: 30 seconds (ref 24–36)

## 2020-11-30 LAB — ETHANOL: Alcohol, Ethyl (B): <10 mg/dL (ref <10)

## 2020-11-30 LAB — GLUCOSE, CAPILLARY: Glucose-Capillary: 136 mg/dL — ABNORMAL HIGH (ref 70–99)

## 2020-11-30 LAB — CBG MONITORING, ED: Glucose-Capillary: 168 mg/dL — ABNORMAL HIGH (ref 70–99)

## 2020-11-30 LAB — TROPONIN I (HIGH SENSITIVITY): Troponin I (High Sensitivity): 7 ng/L (ref <18)

## 2020-11-30 LAB — MAGNESIUM: Magnesium: 1.5 mg/dL — ABNORMAL LOW (ref 1.7–2.4)

## 2020-11-30 MED ORDER — INSULIN ASPART 100 UNIT/ML ~~LOC~~ SOLN
0.0000 [IU] | Freq: Three times a day (TID) | SUBCUTANEOUS | Status: DC
  Administered 2020-12-01 – 2020-12-03 (×7): 2 [IU] via SUBCUTANEOUS
  Administered 2020-12-03: 16:00:00 1 [IU] via SUBCUTANEOUS
  Administered 2020-12-04 (×2): 2 [IU] via SUBCUTANEOUS
  Filled 2020-11-30 (×11): qty 1

## 2020-11-30 MED ORDER — ATORVASTATIN CALCIUM 20 MG PO TABS
80.0000 mg | ORAL_TABLET | Freq: Every day | ORAL | Status: DC
  Administered 2020-12-01 – 2020-12-04 (×4): 80 mg via ORAL
  Filled 2020-11-30 (×4): qty 4

## 2020-11-30 MED ORDER — ONDANSETRON HCL 4 MG/2ML IJ SOLN
INTRAMUSCULAR | Status: AC
  Administered 2020-11-30: 4 mg via INTRAVENOUS
  Filled 2020-11-30: qty 2

## 2020-11-30 MED ORDER — ONDANSETRON HCL 4 MG/2ML IJ SOLN: 4.0000 mg | Freq: Once | INTRAMUSCULAR | Status: AC

## 2020-11-30 MED ORDER — LORAZEPAM 2 MG/ML IJ SOLN
1.0000 mg | Freq: Once | INTRAMUSCULAR | Status: AC | PRN
  Administered 2020-11-30: 1 mg via INTRAVENOUS
  Filled 2020-11-30: qty 1

## 2020-11-30 MED ORDER — ENOXAPARIN SODIUM 60 MG/0.6ML ~~LOC~~ SOLN
0.5000 mg/kg | SUBCUTANEOUS | Status: DC
  Administered 2020-11-30 – 2020-12-01 (×2): 62.5 mg via SUBCUTANEOUS
  Filled 2020-11-30 (×2): qty 1.2

## 2020-11-30 MED ORDER — ACETAMINOPHEN 650 MG RE SUPP: 650.0000 mg | RECTAL | Status: DC | PRN

## 2020-11-30 MED ORDER — POTASSIUM CHLORIDE 20 MEQ PO PACK
80.0000 meq | PACK | Freq: Once | ORAL | Status: DC
  Filled 2020-11-30: qty 4

## 2020-11-30 MED ORDER — MAGNESIUM SULFATE 2 GM/50ML IV SOLN
2.0000 g | Freq: Once | INTRAVENOUS | Status: AC
  Administered 2020-11-30: 2 g via INTRAVENOUS
  Filled 2020-11-30: qty 50

## 2020-11-30 MED ORDER — CLONIDINE HCL 0.1 MG PO TABS
0.1000 mg | ORAL_TABLET | Freq: Two times a day (BID) | ORAL | Status: DC
  Administered 2020-12-01 – 2020-12-04 (×7): 0.1 mg via ORAL
  Filled 2020-11-30 (×7): qty 1

## 2020-11-30 MED ORDER — IOHEXOL 350 MG/ML SOLN
75.0000 mL | Freq: Once | INTRAVENOUS | Status: AC | PRN
  Administered 2020-11-30: 75 mL via INTRAVENOUS

## 2020-11-30 MED ORDER — ACETAMINOPHEN 325 MG PO TABS: 650.0000 mg | ORAL_TABLET | ORAL | Status: DC | PRN

## 2020-11-30 MED ORDER — SENNOSIDES-DOCUSATE SODIUM 8.6-50 MG PO TABS
1.0000 | ORAL_TABLET | Freq: Every evening | ORAL | Status: DC | PRN
Start: 2020-11-30 — End: 2020-12-04

## 2020-11-30 MED ORDER — ALLOPURINOL 100 MG PO TABS
300.0000 mg | ORAL_TABLET | Freq: Every day | ORAL | Status: DC
  Administered 2020-12-01 – 2020-12-04 (×4): 300 mg via ORAL
  Filled 2020-11-30 (×4): qty 3

## 2020-11-30 MED ORDER — AMLODIPINE BESYLATE 10 MG PO TABS
10.0000 mg | ORAL_TABLET | Freq: Every day | ORAL | Status: DC
  Administered 2020-12-01 – 2020-12-04 (×4): 10 mg via ORAL
  Filled 2020-11-30 (×4): qty 1

## 2020-11-30 MED ORDER — LOSARTAN POTASSIUM 50 MG PO TABS
100.0000 mg | ORAL_TABLET | Freq: Every day | ORAL | Status: DC
  Administered 2020-12-01 – 2020-12-04 (×4): 100 mg via ORAL
  Filled 2020-11-30 (×4): qty 2

## 2020-11-30 MED ORDER — CLOPIDOGREL BISULFATE 75 MG PO TABS: 75.0000 mg | ORAL_TABLET | Freq: Every day | ORAL | Status: DC

## 2020-11-30 MED ORDER — FINASTERIDE 5 MG PO TABS
5.0000 mg | ORAL_TABLET | Freq: Every day | ORAL | Status: DC
  Administered 2020-12-01 – 2020-12-04 (×4): 5 mg via ORAL
  Filled 2020-11-30 (×4): qty 1

## 2020-11-30 MED ORDER — CLONIDINE HCL 0.1 MG PO TABS: 0.1000 mg | ORAL_TABLET | Freq: Two times a day (BID) | ORAL | Status: DC

## 2020-11-30 MED ORDER — CLOPIDOGREL BISULFATE 75 MG PO TABS
75.0000 mg | ORAL_TABLET | Freq: Every day | ORAL | Status: DC
  Administered 2020-12-01 – 2020-12-04 (×4): 75 mg via ORAL
  Filled 2020-11-30 (×4): qty 1

## 2020-11-30 MED ORDER — POTASSIUM CHLORIDE 10 MEQ/100ML IV SOLN
10.0000 meq | INTRAVENOUS | Status: AC
  Administered 2020-11-30 – 2020-12-01 (×2): 10 meq via INTRAVENOUS
  Filled 2020-11-30: qty 100

## 2020-11-30 MED ORDER — STROKE: EARLY STAGES OF RECOVERY BOOK: Freq: Once | Status: AC

## 2020-11-30 MED ORDER — ACETAMINOPHEN 160 MG/5ML PO SOLN
650.0000 mg | ORAL | Status: DC | PRN
  Filled 2020-11-30: qty 20.3

## 2020-11-30 MED ORDER — ASPIRIN EC 325 MG PO TBEC
325.0000 mg | DELAYED_RELEASE_TABLET | Freq: Every day | ORAL | Status: DC
  Administered 2020-12-01 – 2020-12-04 (×4): 325 mg via ORAL
  Filled 2020-11-30 (×4): qty 1

## 2020-11-30 MED ORDER — POTASSIUM CHLORIDE 10 MEQ/100ML IV SOLN: 10.0000 meq | INTRAVENOUS | Status: DC

## 2020-11-30 NOTE — Plan of Care (Signed)
  Problem: Education: Goal: Knowledge of General Education information will improve Description: Including pain rating scale, medication(s)/side effects and non-pharmacologic comfort measures 11/30/2020 2259 by Conni Slipper, RN Outcome: Progressing 11/30/2020 2245 by Conni Slipper, RN Outcome: Progressing   Problem: Health Behavior/Discharge Planning: Goal: Ability to manage health-related needs will improve 11/30/2020 2259 by Conni Slipper, RN Outcome: Progressing 11/30/2020 2245 by Conni Slipper, RN Outcome: Progressing   Problem: Clinical Measurements: Goal: Ability to maintain clinical measurements within normal limits will improve 11/30/2020 2259 by Conni Slipper, RN Outcome: Progressing 11/30/2020 2245 by Conni Slipper, RN Outcome: Progressing Goal: Will remain free from infection 11/30/2020 2259 by Conni Slipper, RN Outcome: Progressing 11/30/2020 2245 by Conni Slipper, RN Outcome: Progressing Goal: Diagnostic test results will improve 11/30/2020 2259 by Conni Slipper, RN Outcome: Progressing 11/30/2020 2245 by Conni Slipper, RN Outcome: Progressing Goal: Respiratory complications will improve 11/30/2020 2259 by Conni Slipper, RN Outcome: Progressing 11/30/2020 2245 by Conni Slipper, RN Outcome: Progressing Goal: Cardiovascular complication will be avoided 11/30/2020 2259 by Conni Slipper, RN Outcome: Progressing 11/30/2020 2245 by Conni Slipper, RN Outcome: Progressing   Problem: Activity: Goal: Risk for activity intolerance will decrease 11/30/2020 2259 by Conni Slipper, RN Outcome: Progressing 11/30/2020 2245 by Conni Slipper, RN Outcome: Progressing   Problem: Nutrition: Goal: Adequate nutrition will be maintained 11/30/2020 2259 by Conni Slipper, RN Outcome: Progressing 11/30/2020 2245 by Conni Slipper, RN Outcome: Progressing   Problem: Coping: Goal: Level of anxiety will decrease 11/30/2020 2259 by Conni Slipper, RN Outcome: Progressing 11/30/2020 2245 by Conni Slipper, RN Outcome: Progressing   Problem: Elimination: Goal: Will not experience complications related to bowel motility 11/30/2020 2259 by Conni Slipper, RN Outcome: Progressing 11/30/2020 2245 by Conni Slipper, RN Outcome: Progressing Goal: Will not experience complications related to urinary retention 11/30/2020 2259 by Conni Slipper, RN Outcome: Progressing 11/30/2020 2245 by Conni Slipper, RN Outcome: Progressing   Problem: Pain Managment: Goal: General experience of comfort will improve 11/30/2020 2259 by Conni Slipper, RN Outcome: Progressing 11/30/2020 2245 by Conni Slipper, RN Outcome: Progressing   Problem: Safety: Goal: Ability to remain free from injury will improve 11/30/2020 2259 by Conni Slipper, RN Outcome: Progressing 11/30/2020 2245 by Conni Slipper, RN Outcome: Progressing   Problem: Skin Integrity: Goal: Risk for impaired skin integrity will decrease 11/30/2020 2259 by Conni Slipper, RN Outcome: Progressing 11/30/2020 2245 by Conni Slipper, RN Outcome: Progressing

## 2020-11-30 NOTE — ED Notes (Signed)
Pt wife updated that pt is moving to floor at this time

## 2020-11-30 NOTE — ED Notes (Signed)
Pt to be transported to floor at this time by [facility]

## 2020-11-30 NOTE — Progress Notes (Signed)
CODE STROKE- PHARMACY COMMUNICATION   Time CODE STROKE called/page received:[id number]  Time response to CODE STROKE was made (in person or via phone): 1501  Time Stroke Kit retrieved from [place] (only if needed): N/A  Name of Provider/Nurse contacted: Minette Brine  Past Medical History:  Diagnosis Date  . Arthritis   . BPH (benign prostatic hyperplasia)   . Cancer ([place])    Non-Hodgkins lymphoma  . Chronic prostatitis   . Cirrhosis of liver ([place])   . Diabetes mellitus without complication ([place])   . Dyslipidemia   . Elevated PSA   . Gastric reflux   . Gout   . HTN (hypertension)   . Hyperlipidemia   . Neuropathy   . Over weight   . Psoriasis   . Testicular pain, right   . Urinary frequency    Prior to Admission medications   Medication Sig Start Date End Date Taking? Authorizing Provider  acetaminophen (TYLENOL) 325 MG tablet Take 2 tablets (650 mg total) by mouth every 4 (four) hours as needed for mild pain (or temp > 37.5 C (99.5 F)). 07/10/20   Angiulli, Lavon Paganini, PA-C  allopurinol (ZYLOPRIM) 300 MG tablet Take 1 tablet (300 mg total) by mouth daily. 07/10/20   Angiulli, Lavon Paganini, PA-C  amLODipine (NORVASC) 10 MG tablet Take 1 tablet (10 mg total) by mouth daily. 07/10/20   Angiulli, Lavon Paganini, PA-C  aspirin EC 325 MG EC tablet Take 1 tablet (325 mg total) by mouth daily. 06/15/20   Sidney Ace, MD  atorvastatin (LIPITOR) 80 MG tablet TAKE ONE TABLET (80 MG) BY MOUTH EVERY DAY 09/04/20   [name]., MD  Cholecalciferol (VITAMIN D3) 25 MCG (1000 UT) CAPS Take 1 capsule (1,000 Units total) by mouth daily. 07/10/20   Angiulli, Lavon Paganini, PA-C  cloNIDine (CATAPRES) 0.1 MG tablet Take 1 tablet (0.1 mg total) by mouth 2 (two) times daily. 08/02/20   [name]., MD  finasteride (PROSCAR) 5 MG tablet Take 1 tablet (5 mg total) by mouth daily. 09/05/20   McGowan, Larene Beach A, PA-C  glimepiride (AMARYL) 2 MG tablet TAKE 1 TABLET BY MOUTH DAILY WITH BREAKFAST 09/04/20    [name]., MD  hydrocortisone 2.5 % lotion SMARTSIG:Sparingly Topical 3 Times a Week 07/31/20   [provider]  losartan (COZAAR) 100 MG tablet TAKE ONE TABLET EVERY DAY BY MOUTH 07/10/20   Angiulli, Lavon Paganini, PA-C  polyethylene glycol (MIRALAX / GLYCOLAX) 17 g packet Take 17 g by mouth daily. 07/11/20   Angiulli, Lavon Paganini, PA-C  vitamin B-12 (CYANOCOBALAMIN) 1000 MCG tablet Take 1 tablet (1,000 mcg total) by mouth daily. 09/04/20   [name]., MD    Dallie Piles ,PharmD Clinical Pharmacist  11/30/2020  3:02 PM

## 2020-11-30 NOTE — ED Notes (Signed)
Pt to CT at this time on EMS stretcher with Neuro and stroke RN Olivia at bedside. Ally RN to bedside to get CBG at this time

## 2020-11-30 NOTE — Consult Note (Signed)
NEUROLOGY CONSULTATION NOTE   Date of service: November 30, 2020 Patient Name: Peter Ryan MRN:  [id number] DOB:  1951-06-12 Reason for consult: "Stroke code" _ _ _   _ __   _ __ _ _  __ __   _ __   __ _  History of Present Illness  Peter Ryan is a 70 y.o.  right-handed white male with a history of diabetes, hypertension, obesity, and dyslipidemia, prior left paramedian pontine ischemic infarction and a subcentimeter deep white matter ischemic infarction in the right ACA territory in sept 2021 with residual R sided weakness and dysarthria who presents as a stroke code with worsening of his R sided weakness. Reports that he went to bed on 11/29/20 at 2200 in his recliner. He woke up at 0600 on 11/30/20 and was nauseous, felt weak and had an ache in his R elbow. He went back to sleep in his recliner for a couple hours and then a little after 1030AM, tried to get out and noticed that he was much more weak. He called EMS in the afternoon.  Reports that the R arm weakness was much worse when EMS saw him and now is about a 9 out of 10 from where is usually is. Feels like his R leg is pretty much back to his baseline. Took his aspirin 325mg  when he got up this morning. No family hx of strokes.  In the [place], he had an episode of nausea and threw up a little. Felt better afterwards.  Denies any vertigo, no double or blurred vision, no left sided weakness, just his right. No numbness. No dysarthria. No trouble finding words.  NIHSS of 2 for mild R facial assymetry and R leg drift. MRS: 0 TPA: no outside the window, symptoms improving Thrombectomy: No, symptoms mild and spontaneously improving.   ROS   Constitutional Denies weight loss, fever and chills.  HEENT Denies changes in vision and hearing.  Respiratory Denies SOB and cough.  CV Denies palpitations and CP  GI Denies abdominal pain, nausea, vomiting and diarrhea.   GU Denies dysuria and urinary frequency.   MSK Denies myalgia and  joint pain.   Skin Denies rash and pruritus.   Neurological Denies headache and syncope.   Psychiatric Denies recent changes in mood. Denies anxiety and depression.    Past History   Past Medical History:  Diagnosis Date  . Arthritis   . BPH (benign prostatic hyperplasia)   . Cancer ([place])    Non-Hodgkins lymphoma  . Chronic prostatitis   . Cirrhosis of liver ([place])   . Diabetes mellitus without complication ([place])   . Dyslipidemia   . Elevated PSA   . Gastric reflux   . Gout   . HTN (hypertension)   . Hyperlipidemia   . Neuropathy   . Over weight   . Psoriasis   . Testicular pain, right   . Urinary frequency    Past Surgical History:  Procedure Laterality Date  . hydrocelectomy    . IR FLUORO GUIDE PORT INSERTION RIGHT  04/11/2017  . IR REMOVAL TUN ACCESS W/ PORT W/O FL MOD SED  05/29/2018  . PILONIDAL CYST EXCISION    . TEE WITHOUT CARDIOVERSION N/A 06/09/2020   Procedure: TRANSESOPHAGEAL ECHOCARDIOGRAM (TEE);  Surgeon: Minna Merritts, MD;  Location: [facility] ORS;  Service: Cardiovascular;  Laterality: N/A;   Family History  Problem Relation Age of Onset  . Uterine cancer Mother   . Diabetes Mother   . Cancer Mother   .  Cancer Maternal Uncle   . Bladder Cancer Neg Hx   . Kidney disease Neg Hx   . Prostate cancer Neg Hx    Social History   Socioeconomic History  . Marital status: Married    Spouse name: Peter Ryan  . Number of children: 0  . Years of education: Not on file  . Highest education level: Some college, no degree  Occupational History  . Occupation: retired  Tobacco Use  . Smoking status: Former Smoker    Packs/day: 1.50    Years: 8.00    Pack years: 12.00    Types: Cigarettes    Quit date: 05/29/1974    Years since quitting: 46.5  . Smokeless tobacco: Never Used  . Tobacco comment: quit 40 years ago  Vaping Use  . Vaping Use: Never used  Substance and Sexual Activity  . Alcohol use: No    Alcohol/week: 0.0 standard drinks    Comment: rarely  .  Drug use: No  . Sexual activity: Not on file  Other Topics Concern  . Not on file  Social History Narrative   Lives with wife   Right handed   Drinks 1-2 cups coffee   Social Determinants of Health   Financial Resource Strain: Low Risk   . Difficulty of Paying Living Expenses: Not hard at all  Food Insecurity: No Food Insecurity  . Worried About Charity fundraiser in the Last Year: Never true  . Ran Out of Food in the Last Year: Never true  Transportation Needs: No Transportation Needs  . Lack of Transportation (Medical): No  . Lack of Transportation (Non-Medical): No  Physical Activity: Inactive  . Days of Exercise per Week: 0 days  . Minutes of Exercise per Session: 0 min  Stress: No Stress Concern Present  . Feeling of Stress : Not at all  Social Connections: Moderately Isolated  . Frequency of Communication with Friends and Family: Three times a week  . Frequency of Social Gatherings with Friends and Family: Once a week  . Attends Religious Services: Never  . Active Member of Clubs or Organizations: No  . Attends Archivist Meetings: Never  . Marital Status: Married   Allergies  Allergen Reactions  . Hctz [Hydrochlorothiazide] Other (See Comments)    Gout flare  . Metformin Other (See Comments)    Stomach upset     Medications  (Not in a hospital admission)    Vitals   There were no vitals filed for this visit.   There is no height or weight on file to calculate BMI.  Physical Exam   General: Laying comfortably in bed; in no acute distress.  HENT: Normal oropharynx and mucosa. Normal external appearance of ears and nose.  Neck: Supple, no pain or tenderness  CV: No JVD. No peripheral edema.  Pulmonary: Symmetric Chest rise. Normal respiratory effort.  Abdomen: Soft to touch, non-tender.  Ext: No cyanosis, edema, or deformity  Skin: No rash. Normal palpation of skin.   Musculoskeletal: Normal digits and nails by inspection. No clubbing.    Neurologic Examination  Mental status/Cognition: Alert, oriented to self, place, month and year, good attention.  Speech/language: Fluent, comprehension intact, object naming intact, repetition intact.  Cranial nerves:   CN II Pupils equal and reactive to light, no VF deficits    CN III,IV,VI EOM intact, no gaze preference or deviation, no nystagmus    CN V normal sensation in V1, V2, and V3 segments bilaterally    CN  VII Mild R nasolabial flattening.   CN VIII normal hearing to speech    CN IX & X normal palatal elevation, no uvular deviation    CN XI 5/5 head turn and 5/5 shoulder shrug bilaterally    CN XII midline tongue protrusion    Motor:  Muscle bulk: normal, tone normal, pronator drift none tremor none Mvmt Root Nerve  Muscle Right Left Comments  SA C5/6 Ax Deltoid 5 5   EF C5/6 Mc Biceps 5 5   EE C6/7/8 Rad Triceps 5 5   WF C6/7 Med FCR     WE C7/8 PIN ECU     F Ab C8/T1 U ADM/FDI 4+ 5   HF L1/2/3 Fem Illopsoas 4- 5   KE L2/3/4 Fem Quad 4 5   DF L4/5 D Peron Tib Ant 5 5   PF S1/2 Tibial Grc/Sol 5 5    Reflexes:  Right Left Comments  Pectoralis      Biceps (C5/6) 2 2   Brachioradialis (C5/6) 2 2    Triceps (C6/7) 2 2    Patellar (L3/4) 2+ 2+    Achilles (S1)      Hoffman      Plantar     Jaw jerk    Sensation:  Light touch intact   Pin prick    Temperature    Vibration   Proprioception    Coordination/Complex Motor:  - Finger to Nose no ataxia. - Heel to shin unable to do on the R, no ataxia on the left. - Rapid alternating movement are normal - Gait: Deferred.   NIHSS components Score: Comment  1a Level of Conscious 0[x]  1[]  2[]  3[]      1b LOC Questions 0[x]  1[]  2[]       1c LOC Commands 0[x]  1[]  2[]       2 Best Gaze 0[x]  1[]  2[]       3 Visual 0[x]  1[]  2[]  3[]      4 Facial Palsy 0[]  1[x]  2[]  3[]      5a Motor Arm - left 0[x]  1[]  2[]  3[]  4[]  UN[]    5b Motor Arm - Right 0[x]  1[]  2[]  3[]  4[]  UN[]    6a Motor Leg - Left 0[x]  1[]  2[]  3[]  4[]  UN[]     6b Motor Leg - Right 0[]  1[x]  2[]  3[]  4[]  UN[]    7 Limb Ataxia 0[x]  1[]  2[]  3[]  UN[]     8 Sensory 0[x]  1[]  2[]  UN[]      9 Best Language 0[x]  1[]  2[]  3[]      10 Dysarthria 0[x]  1[]  2[]  UN[]      11 Extinct. and Inattention 0[x]  1[]  2[]       TOTAL: 2     Labs   CBC: No results for input(s): WBC, NEUTROABS, HGB, HCT, MCV, PLT in the last 168 hours.  Basic Metabolic Panel:  Lab Results  Component Value Date   NA 139 07/10/2020   K 4.3 07/10/2020   CO2 20 (L) 07/10/2020   GLUCOSE 124 (H) 07/10/2020   BUN 16 07/10/2020   CREATININE 0.95 07/10/2020   CALCIUM 8.5 (L) 07/10/2020   GFRNONAA >60 07/10/2020   GFRAA >60 06/15/2020   Lipid Panel:  Lab Results  Component Value Date   LDLCALC 77 06/06/2020   HgbA1c:  Lab Results  Component Value Date   HGBA1C 6.9 (H) 10/03/2020   Urine Drug Screen: No results found for: LABOPIA, COCAINSCRNUR, LABBENZ, AMPHETMU, THCU, LABBARB  Alcohol Level No results found for: [place]  CT Head without contrast: IMPRESSION: 1. No  acute abnormality 2. ASPECTS is 10 3. Moderate to extensive chronic microvascular ischemic change in the white matter.  CT angio Head and Neck with contrast: Multifocal multivessel stenosis but no LVO on my read. Radiology read pending.  MRI Brain pending  Impression   Manolo Bosket is a 70 y.o. male with PMH significant for right-handed white male with a history of diabetes, hypertension, obesity, and dyslipidemia, prior left paramedian pontine ischemic infarction and a subcentimeter deep white matter ischemic infarction in the right ACA territory in sept 2021 with residual R sided weakness and dysarthria who presents as a stroke code with worsening of his R sided weakness, reports symptoms improving by the time he got to the ED. Exam with R hand grip mildly weak compared to left. RLE drift and mild flattening of R nasolabial fold. Most the deficit is from his old stroke. R leg is the same as it is at baseline. R hand is  about a 9/10 compared to his baseline. He had an episode of nausea with vomiting in the CT scanner.  Suspect that his current episode is either recrudescence of prior stroke symptoms vs a new stroke.  Recommendations  - Recommend MRI Brain without contrast. Further recs following MRI Brain without contrast.  _____________________________________________________________________   Thank you for the opportunity to take part in the care of this patient. If you have any further questions, please contact the neurology consultation attending.  Signed,  [place] Pager Number [phone] _ _ _   _ __   _ __ _ _  __ __   _ __   __ _

## 2020-11-30 NOTE — H&P (Addendum)
History and Physical    Peter Ryan ASN:[id number] DOB: 1950-10-13 DOA: 11/30/2020  PCP: [name]., MD  Patient coming from: Home via EMS  I have personally briefly reviewed patient's old medical records in [place]  Chief Complaint: Worsening right-sided weakness  HPI: Peter Ryan is a 70 y.o. male with medical history significant for left pontine stroke with residual right-sided weakness and dysarthria (05/2020), diffuse large B-cell lymphoma s/p 6 cycles R-CHOP, T2DM, HTN, HLD, IgA MGUS, BPH who presents to the ED for evaluation of worsening right-sided weakness from baseline.  Patient states he has residual right-sided weakness since her stroke in September 2021.  This morning around 10 AM when he was getting out of bed to walk with his walker he noticed worsening weakness in his right arm and leg compared to baseline.  He did not have any change in speech from baseline or new facial weakness.  He was concerned about recurrent stroke therefore came to the ED for further evaluation.  He has also had several episodes of nausea today without emesis.  He felt dizzy after quick positional change while in the ED.  He has not had any recent chest pain, palpitations, dyspnea, abdominal pain, dysuria, or diarrhea.  He has been taking aspirin 325 mg daily.  ED Course:  Initial vitals showed BP 136/79, pulse 90, RR 18, temp 98.0 F, SPO2 95% on room air.  Labs show sodium 137, potassium 3.0, bicarb 24, BUN 11, creatinine 0.79, serum glucose 202, calcium 8.5, LFTs within normal limits, WBC 10.6, hemoglobin 14.7, platelets 236,000, troponin 7.  Serum ethanol <10.  Urinalysis shows >300 protein, negative ketones, negative nitrates, negative leukocytes, 6-10 RBC/hpf, 21-50 WBC/hpf, rare bacteria on microscopy.  UDS is negative.  SARS-CoV-2 PCR is negative.  Influenza A/B PCR's are negative.  Patient arrived as a code stroke.  CT head without contrast was negative for acute  abnormality.  Moderate to extensive chronic microvascular ischemic change in the white matter is noted.  Neurology were consulted and have evaluated the patient.  CTA head/neck were obtained negative for intracranial large vessel occlusion.  No significant interval change compared to prior imaging from 06/05/2020 noted.  Severe atherosclerotic narrowing of the precavernous left ICA, moderate stenosis within the proximal M2 left MCA branch vessel, severe stenosis within median artery corpus callosum, moderate stenosis dominant D4 right vertebral artery, and severe stenoses within nondominant V4 left vertebral artery noted.  MRI brain without contrast was negative for acute intracranial abnormality.  Patient was noted to have continued right-sided weakness and felt unable to safely return home.  Neurology recommended observation and adding Plavix 75 mg daily and will reevaluate in a.m.  The hospitalist service was consulted to admit for further evaluation and management.  Review of Systems: All systems reviewed and are negative except as documented in history of present illness above.   Past Medical History:  Diagnosis Date  . Arthritis   . BPH (benign prostatic hyperplasia)   . Cancer ([place])    Non-Hodgkins lymphoma  . Chronic prostatitis   . Cirrhosis of liver ([place])   . Diabetes mellitus without complication ([place])   . Dyslipidemia   . Elevated PSA   . Gastric reflux   . Gout   . HTN (hypertension)   . Hyperlipidemia   . Neuropathy   . Over weight   . Psoriasis   . Testicular pain, right   . Urinary frequency     Past Surgical History:  Procedure Laterality  Date  . hydrocelectomy    . IR FLUORO GUIDE PORT INSERTION RIGHT  04/11/2017  . IR REMOVAL TUN ACCESS W/ PORT W/O FL MOD SED  05/29/2018  . PILONIDAL CYST EXCISION    . TEE WITHOUT CARDIOVERSION N/A 06/09/2020   Procedure: TRANSESOPHAGEAL ECHOCARDIOGRAM (TEE);  Surgeon: Minna Merritts, MD;  Location: [facility] ORS;  Service:  Cardiovascular;  Laterality: N/A;    Social History:  reports that he quit smoking about 46 years ago. His smoking use included cigarettes. He has a 12.00 pack-year smoking history. He has never used smokeless tobacco. He reports that he does not drink alcohol and does not use drugs.  Allergies  Allergen Reactions  . Hctz [Hydrochlorothiazide] Other (See Comments)    Gout flare  . Metformin Other (See Comments)    Stomach upset     Family History  Problem Relation Age of Onset  . Uterine cancer Mother   . Diabetes Mother   . Cancer Mother   . Cancer Maternal Uncle   . Bladder Cancer Neg Hx   . Kidney disease Neg Hx   . Prostate cancer Neg Hx      Prior to Admission medications   Medication Sig Start Date End Date Taking? Authorizing Provider  allopurinol (ZYLOPRIM) 300 MG tablet Take 1 tablet (300 mg total) by mouth daily. 07/10/20  Yes Angiulli, Lavon Paganini, PA-C  amLODipine (NORVASC) 10 MG tablet Take 1 tablet (10 mg total) by mouth daily. 07/10/20  Yes Angiulli, Lavon Paganini, PA-C  aspirin EC 325 MG EC tablet Take 1 tablet (325 mg total) by mouth daily. 06/15/20  Yes Sreenath, Sudheer B, MD  atorvastatin (LIPITOR) 80 MG tablet TAKE ONE TABLET (80 MG) BY MOUTH EVERY DAY Patient taking differently: Take 80 mg by mouth daily. 09/04/20  Yes [name]., MD  Cholecalciferol (VITAMIN D3) 25 MCG (1000 UT) CAPS Take 1 capsule (1,000 Units total) by mouth daily. 07/10/20  Yes Angiulli, Lavon Paganini, PA-C  cloNIDine (CATAPRES) 0.1 MG tablet Take 1 tablet (0.1 mg total) by mouth 2 (two) times daily. 08/02/20  Yes [name]., MD  finasteride (PROSCAR) 5 MG tablet Take 1 tablet (5 mg total) by mouth daily. 09/05/20  Yes McGowan, Larene Beach A, PA-C  glimepiride (AMARYL) 2 MG tablet TAKE 1 TABLET BY MOUTH DAILY WITH BREAKFAST Patient taking differently: Take 2 mg by mouth daily with breakfast. 09/04/20  Yes [name]., MD  losartan (COZAAR) 100 MG tablet TAKE ONE TABLET  EVERY DAY BY MOUTH Patient taking differently: Take 100 mg by mouth daily. TAKE ONE TABLET EVERY DAY BY MOUTH 07/10/20  Yes Angiulli, Lavon Paganini, PA-C  vitamin B-12 (CYANOCOBALAMIN) 1000 MCG tablet Take 1 tablet (1,000 mcg total) by mouth daily. 09/04/20  Yes [name]., MD  acetaminophen (TYLENOL) 325 MG tablet Take 2 tablets (650 mg total) by mouth every 4 (four) hours as needed for mild pain (or temp > 37.5 C (99.5 F)). 07/10/20   Angiulli, Lavon Paganini, PA-C  hydrocortisone 2.5 % lotion SMARTSIG:Sparingly Topical 3 Times a Week 07/31/20   [provider]  polyethylene glycol (MIRALAX / GLYCOLAX) 17 g packet Take 17 g by mouth daily. 07/11/20   Cathlyn Parsons, PA-C    Physical Exam: Vitals:   11/30/20 1622 11/30/20 1722 11/30/20 1730 11/30/20 1800  BP: 140/70 (!) 160/77 (!) 156/78 (!) 154/74  Pulse: 91 84 83 85  Resp: 17 19 17 19   Temp:  TempSrc:      SpO2: 97% 97% 95% 95%  Weight:      Height:       Constitutional: Resting supine in bed, NAD, calm, comfortable Eyes: PERRL, EOMI, lids and conjunctivae normal ENMT: Mucous membranes are moist. Posterior pharynx clear of any exudate or lesions.Normal dentition.  Neck: normal, supple, no masses. Respiratory: clear to auscultation bilaterally, no wheezing, no crackles. Normal respiratory effort. No accessory muscle use.  Cardiovascular: Regular rate and rhythm, no murmurs / rubs / gallops. No extremity edema. 2+ pedal pulses. Abdomen: no tenderness, no masses palpated. Bowel sounds positive.  Musculoskeletal: no clubbing / cyanosis. No joint deformity upper and lower extremities. ROM diminished right hip, no contractures. Normal muscle tone.  Skin: no rashes, lesions, ulcers. No induration Neurologic: Slight drooping of right mouth otherwise CN 2-12 grossly intact. Sensation intact. Strength 5/5 in left upper and lower extremities, 4/5 RUE and 2/5 RLE. Psychiatric: Normal judgment and insight. Alert and oriented x 3.  Normal mood.  Labs on Admission: I have personally reviewed following labs and imaging studies  CBC: Recent Labs  Lab 11/30/20 1506  WBC 10.6*  NEUTROABS 7.8*  HGB 14.7  HCT 43.4  MCV 80.7  PLT 161   Basic Metabolic Panel: Recent Labs  Lab 11/30/20 1506  NA 137  K 3.0*  CL 99  CO2 24  GLUCOSE 202*  BUN 11  CREATININE 0.79  CALCIUM 8.5*   GFR: Estimated Creatinine Clearance: 117.6 mL/min (by C-G formula based on SCr of 0.79 mg/dL). Liver Function Tests: Recent Labs  Lab 11/30/20 1506  AST 23  ALT 20  ALKPHOS 79  BILITOT 0.7  PROT 7.3  ALBUMIN 3.8   No results for input(s): LIPASE, AMYLASE in the last 168 hours. No results for input(s): AMMONIA in the last 168 hours. Coagulation Profile: Recent Labs  Lab 11/30/20 1506  INR 1.0   Cardiac Enzymes: No results for input(s): CKTOTAL, CKMB, CKMBINDEX, TROPONINI in the last 168 hours. BNP (last 3 results) No results for input(s): PROBNP in the last 8760 hours. HbA1C: No results for input(s): HGBA1C in the last 72 hours. CBG: Recent Labs  Lab 11/30/20 1500  GLUCAP 168*   Lipid Profile: No results for input(s): CHOL, HDL, LDLCALC, TRIG, CHOLHDL, LDLDIRECT in the last 72 hours. Thyroid Function Tests: No results for input(s): TSH, T4TOTAL, FREET4, T3FREE, THYROIDAB in the last 72 hours. Anemia Panel: No results for input(s): VITAMINB12, FOLATE, FERRITIN, TIBC, IRON, RETICCTPCT in the last 72 hours. Urine analysis:    Component Value Date/Time   COLORURINE YELLOW (A) 11/30/2020 1757   APPEARANCEUR HAZY (A) 11/30/2020 1757   APPEARANCEUR Clear 07/18/2016 0927   LABSPEC >1.046 (H) 11/30/2020 1757   PHURINE 6.0 11/30/2020 1757   GLUCOSEU 50 (A) 11/30/2020 1757   HGBUR NEGATIVE 11/30/2020 1757   BILIRUBINUR NEGATIVE 11/30/2020 1757   BILIRUBINUR negative 09/07/2020 1505   BILIRUBINUR Negative 07/18/2016 0927   KETONESUR NEGATIVE 11/30/2020 1757   PROTEINUR >=300 (A) 11/30/2020 1757   UROBILINOGEN 0.2  09/07/2020 1505   NITRITE NEGATIVE 11/30/2020 1757   LEUKOCYTESUR NEGATIVE 11/30/2020 1757    Radiological Exams on Admission: MR BRAIN WO CONTRAST  Result Date: 11/30/2020 CLINICAL DATA:  Neuro deficit, acute stroke suspected. EXAM: MRI HEAD WITHOUT CONTRAST TECHNIQUE: Multiplanar, multiecho pulse sequences of the brain and surrounding structures were obtained without intravenous contrast. COMPARISON:  Same day head CT.  MRI June 08, 2020. FINDINGS: Mildly motion limited study. Brain: No acute infarction, hemorrhage, hydrocephalus, extra-axial  collection or mass lesion. Advanced similar extensive T2/FLAIR hyperintensity within the white matter, compatible with chronic microvascular ischemic disease. Similar remote infarcts involving the right basal ganglia, bilateral thalami, and left corona radiata. Focus of susceptibility in the right thalamus, compatible with prior microhemorrhage. Vascular: Better evaluated on same day CTA. Major arterial flow voids are maintained at the skull base. Skull and upper cervical spine: Normal marrow signal. Sinuses/Orbits: Sinuses are largely clear. Other: No sizable mastoid effusion. IMPRESSION: 1. No evidence of acute intracranial abnormality. 2. Similar advanced chronic microvascular ischemic disease and remote infarcts. Electronically Signed   By: Margaretha Sheffield MD   On: 11/30/2020 17:20   CT HEAD CODE STROKE WO CONTRAST  Result Date: 11/30/2020 CLINICAL DATA:  Code stroke.  Acute neuro deficit.  Rule out stroke. EXAM: CT HEAD WITHOUT CONTRAST TECHNIQUE: Contiguous axial images were obtained from the base of the skull through the vertex without intravenous contrast. COMPARISON:  CT head 06/05/2020.  MRI 06/08/2020. FINDINGS: Brain: Generalized atrophy. Negative for hydrocephalus. Moderate to advanced white matter hypodensity diffusely and bilaterally appears chronic. Chronic infarct right basal ganglia. Chronic infarct midline pons, best seen on MRI. Negative  for acute infarct, hemorrhage, mass. Vascular: Negative for hyperdense vessel. Atherosclerotic calcification in the carotid and vertebral arteries. Skull: Negative Sinuses/Orbits: Paranasal sinuses clear. No orbital lesion. Right cataract extraction. Other: None ASPECTS ([place] Stroke Program Early CT Score) - Ganglionic level infarction (caudate, lentiform nuclei, internal capsule, insula, M1-M3 cortex): 7 - Supraganglionic infarction (M4-M6 cortex): 3 Total score (0-10 with 10 being normal): 10 IMPRESSION: 1. No acute abnormality 2. ASPECTS is 10 3. Moderate to extensive chronic microvascular ischemic change in the white matter. 4. These results were called by telephone at the time of interpretation on 11/30/2020 at 3:25 pm to provider [facility] , who verbally acknowledged these results. Electronically Signed   By: Franchot Gallo M.D.   On: 11/30/2020 15:26   CT ANGIO HEAD CODE STROKE  Addendum Date: 11/30/2020   ADDENDUM REPORT: 11/30/2020 16:06 ADDENDUM: These results were called by telephone at the time of interpretation on 11/30/2020 at 4:00 pm to provider [facility] , who verbally acknowledged these results. Electronically Signed   By: Kellie Simmering DO   On: 11/30/2020 16:06   Result Date: 11/30/2020 CLINICAL DATA:  Stroke/TIA, assess intracranial arteries. Stroke/TIA, assess extracranial arteries. EXAM: CT ANGIOGRAPHY HEAD AND NECK TECHNIQUE: Multidetector CT imaging of the head and neck was performed using the standard protocol during bolus administration of intravenous contrast. Multiplanar CT image reconstructions and MIPs were obtained to evaluate the vascular anatomy. Carotid stenosis measurements (when applicable) are obtained utilizing NASCET criteria, using the distal internal carotid diameter as the denominator. CONTRAST:  55mL OMNIPAQUE IOHEXOL 350 MG/ML SOLN COMPARISON:  Noncontrast head CT performed earlier today 11/30/2020. Brain MRI 06/08/2020. CT angiogram head/neck 06/06/2019  FINDINGS: CTA NECK FINDINGS Aortic arch: Standard aortic branching. No hemodynamically significant innominate or proximal subclavian artery stenosis. Right carotid system: CCA and ICA patent within the neck without significant stenosis (50% or greater). Mild soft and calcified plaque within the carotid bifurcation and proximal ICA. Left carotid system: CCA and ICA patent within the neck without significant stenosis (50% or greater). Mild soft and calcified plaque within the carotid bifurcation and proximal ICA. Vertebral arteries: The right vertebral artery is significantly dominant. Redemonstrated mild/moderate atherosclerotic narrowing of the proximal right V1 segment. Nonstenotic calcified plaque within the distal right V3 segment. The left vertebral artery is developmentally diminutive, but patent within the neck.  Nonstenotic calcified plaque at the origin of this vessel. Skeleton: Cervical spondylosis. No acute bony abnormality or aggressive osseous lesion. Other neck: No neck mass or cervical lymphadenopathy. Subcentimeter thyroid nodules not meeting consensus criteria for ultrasound follow-up. Upper chest: No consolidation within the imaged lung apices. Review of the MIP images confirms the above findings CTA HEAD FINDINGS Anterior circulation: The intracranial right internal carotid artery is patent. Mild calcified plaque within this vessel with no more than mild stenosis. The intracranial left internal carotid artery is patent. Soft plaque results in severe stenosis of the pre cavernous left ICA (series 8, image 110) (series 10, image 139). In retrospect, this stenosis was present on the prior CTA of 06/05/2020. Nonstenotic calcified plaque more distally within the distal cavernous and paraclinoid left ICA. The M1 middle cerebral arteries are patent. No M2 proximal branch occlusion is identified. The anterior cerebral arteries are patent. There is atherosclerotic irregularity of the branch vessels. Most  notably, there is redemonstration of a moderate stenosis within a proximal M2 left MCA branch (series 9, image 20). Also redemonstrated, there is a median artery of the corpus callosum with a moderate/severe stenosis distally (series 11, image 25). No intracranial aneurysm is identified. Posterior circulation: The dominant intracranial right vertebral artery is patent. Redemonstrated moderate stenosis within the V4 right vertebral artery. The non dominant V4 left vertebral artery is patent with redemonstration of multifocal severe stenoses. The basilar artery is patent. Fetal origin right posterior cerebral artery. The posterior cerebral arteries are patent. A right posterior communicating artery is present. The left posterior communicating artery is hypoplastic or absent. Venous sinuses: Within the limitations of contrast timing, no convincing thrombus. Anatomic variants: As described Review of the MIP images confirms the above findings IMPRESSION: CTA neck: 1. The common carotid and internal carotid arteries are patent within the neck without hemodynamically significant stenosis. Mild atherosclerotic disease within the carotid systems within the neck as described. 2. Vertebral arteries patent within the neck. Redemonstrated mild/moderate atherosclerotic narrowing of the proximal V1 segment of the dominant right vertebral artery. The left vertebral artery is developmentally diminutive. CTA head: 1. No intracranial large vessel occlusion. 2. No significant interval change as compared to the CTA head/neck of 06/05/2020. 3. Intracranial atherosclerotic disease with multifocal stenoses, most notably as follows. 4. Severe atherosclerotic narrowing of the pre cavernous left internal carotid artery. 5. Moderate stenosis within a proximal M2 left MCA branch vessel. 6. Severe stenosis within the median artery of the corpus callosum somewhat distally. 7. Moderate atherosclerotic stenosis of the dominant V4 right vertebral  artery. 8. Severe stenoses within the non dominant V4 left vertebral artery. Electronically Signed: By: Kellie Simmering DO On: 11/30/2020 15:59   CT ANGIO NECK CODE STROKE  Addendum Date: 11/30/2020   ADDENDUM REPORT: 11/30/2020 16:06 ADDENDUM: These results were called by telephone at the time of interpretation on 11/30/2020 at 4:00 pm to provider [facility] , who verbally acknowledged these results. Electronically Signed   By: Kellie Simmering DO   On: 11/30/2020 16:06   Result Date: 11/30/2020 CLINICAL DATA:  Stroke/TIA, assess intracranial arteries. Stroke/TIA, assess extracranial arteries. EXAM: CT ANGIOGRAPHY HEAD AND NECK TECHNIQUE: Multidetector CT imaging of the head and neck was performed using the standard protocol during bolus administration of intravenous contrast. Multiplanar CT image reconstructions and MIPs were obtained to evaluate the vascular anatomy. Carotid stenosis measurements (when applicable) are obtained utilizing NASCET criteria, using the distal internal carotid diameter as the denominator. CONTRAST:  89mL OMNIPAQUE IOHEXOL 350  MG/ML SOLN COMPARISON:  Noncontrast head CT performed earlier today 11/30/2020. Brain MRI 06/08/2020. CT angiogram head/neck 06/06/2019 FINDINGS: CTA NECK FINDINGS Aortic arch: Standard aortic branching. No hemodynamically significant innominate or proximal subclavian artery stenosis. Right carotid system: CCA and ICA patent within the neck without significant stenosis (50% or greater). Mild soft and calcified plaque within the carotid bifurcation and proximal ICA. Left carotid system: CCA and ICA patent within the neck without significant stenosis (50% or greater). Mild soft and calcified plaque within the carotid bifurcation and proximal ICA. Vertebral arteries: The right vertebral artery is significantly dominant. Redemonstrated mild/moderate atherosclerotic narrowing of the proximal right V1 segment. Nonstenotic calcified plaque within the distal right V3  segment. The left vertebral artery is developmentally diminutive, but patent within the neck. Nonstenotic calcified plaque at the origin of this vessel. Skeleton: Cervical spondylosis. No acute bony abnormality or aggressive osseous lesion. Other neck: No neck mass or cervical lymphadenopathy. Subcentimeter thyroid nodules not meeting consensus criteria for ultrasound follow-up. Upper chest: No consolidation within the imaged lung apices. Review of the MIP images confirms the above findings CTA HEAD FINDINGS Anterior circulation: The intracranial right internal carotid artery is patent. Mild calcified plaque within this vessel with no more than mild stenosis. The intracranial left internal carotid artery is patent. Soft plaque results in severe stenosis of the pre cavernous left ICA (series 8, image 110) (series 10, image 139). In retrospect, this stenosis was present on the prior CTA of 06/05/2020. Nonstenotic calcified plaque more distally within the distal cavernous and paraclinoid left ICA. The M1 middle cerebral arteries are patent. No M2 proximal branch occlusion is identified. The anterior cerebral arteries are patent. There is atherosclerotic irregularity of the branch vessels. Most notably, there is redemonstration of a moderate stenosis within a proximal M2 left MCA branch (series 9, image 20). Also redemonstrated, there is a median artery of the corpus callosum with a moderate/severe stenosis distally (series 11, image 25). No intracranial aneurysm is identified. Posterior circulation: The dominant intracranial right vertebral artery is patent. Redemonstrated moderate stenosis within the V4 right vertebral artery. The non dominant V4 left vertebral artery is patent with redemonstration of multifocal severe stenoses. The basilar artery is patent. Fetal origin right posterior cerebral artery. The posterior cerebral arteries are patent. A right posterior communicating artery is present. The left posterior  communicating artery is hypoplastic or absent. Venous sinuses: Within the limitations of contrast timing, no convincing thrombus. Anatomic variants: As described Review of the MIP images confirms the above findings IMPRESSION: CTA neck: 1. The common carotid and internal carotid arteries are patent within the neck without hemodynamically significant stenosis. Mild atherosclerotic disease within the carotid systems within the neck as described. 2. Vertebral arteries patent within the neck. Redemonstrated mild/moderate atherosclerotic narrowing of the proximal V1 segment of the dominant right vertebral artery. The left vertebral artery is developmentally diminutive. CTA head: 1. No intracranial large vessel occlusion. 2. No significant interval change as compared to the CTA head/neck of 06/05/2020. 3. Intracranial atherosclerotic disease with multifocal stenoses, most notably as follows. 4. Severe atherosclerotic narrowing of the pre cavernous left internal carotid artery. 5. Moderate stenosis within a proximal M2 left MCA branch vessel. 6. Severe stenosis within the median artery of the corpus callosum somewhat distally. 7. Moderate atherosclerotic stenosis of the dominant V4 right vertebral artery. 8. Severe stenoses within the non dominant V4 left vertebral artery. Electronically Signed: By: Kellie Simmering DO On: 11/30/2020 15:59    EKG: Personally reviewed. Normal  sinus rhythm without acute ischemic changes, QTC 528.  Not significantly changed when compared to prior.  Assessment/Plan Principal Problem:   Right sided weakness Active Problems:   Hyperlipidemia associated with type 2 diabetes mellitus ([place])   Controlled type 2 diabetes mellitus with hyperglycemia, without long-term current use of insulin ([place])   Hypertension associated with diabetes ([place])   History of stroke with residual deficit   Jeremie Giangrande is a 70 y.o. male with medical history significant for left pontine stroke with residual  right-sided weakness and dysarthria (05/2020), diffuse large B-cell lymphoma s/p 6 cycles R-CHOP, T2DM, HTN, HLD, IgA MGUS, BPH who is admitted with acute on chronic right-sided weakness.  Acute on chronic right-sided weakness History of left pontine stroke 05/2020 with residual right-sided weakness and dysarthria: Suspect recrudescence of prior stroke symptoms as MRI brain is negative for acute CVA.  CTA head/neck without any significant interval change compared to prior.  He still feels his right leg weakness is worse compared to his new baseline.  Failed stroke swallow screen in the ED. -Neurology following -Continue aspirin 325 mg daily -Add Plavix 75 mg daily per neurology recommendations -Monitor overnight on telemetry, continue neurochecks -PT/OT/SLP eval  Hypokalemia/hypomagnesemia: Supplemented recheck in a.m.  Hypertension: Resume home amlodipine, clonidine, losartan.  Type 2 diabetes: Hold home Amaryl and place on sensitive SSI.  Hyperlipidemia: Continue atorvastatin.  BPH: Continue finasteride.  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 Janese Banks.  S/p 6 cycles of R-CHOP for DLBCL, conservative monitoring of IgA MGUS.  DVT prophylaxis: Lovenox Code Status: Full code, confirmed with patient Family Communication: Discussed with patient, he has discussed with family Disposition Plan: From home and likely return to home Consults called: Neurology Level of care: Med-Surg Admission status:  Status is: Observation  The patient remains OBS appropriate and will d/c before 2 midnights.  Dispo: The patient is from: Home              Anticipated d/c is to: Home              Patient currently is not medically stable to d/c.   Zada Finders MD [facility]  If 7PM-7AM, please contact night-coverage [email or web address]  11/30/2020, 6:29 PM

## 2020-11-30 NOTE — Plan of Care (Signed)

## 2020-11-30 NOTE — ED Triage Notes (Signed)
Pt arrived to ED[facility]A from CT at this time. Per Minette Brine RN, LKW 10 PM last PM. Per Romie Jumper, neurology scored NIHSS of 2 with 1 point for facial droop and 1 point for R leg.   Pt is A&Ox4 at this time.   Per Minette Brine, pt given 4mg  IV zofran in CT due to vomiting.

## 2020-11-30 NOTE — ED Provider Notes (Signed)
[facility] Emergency Department Provider Note  ____________________________________________   Event Date/Time   First MD Initiated Contact with Patient 11/30/20 1459     (approximate)  I have reviewed the triage vital signs and the nursing notes.   HISTORY  Chief Complaint Code Stroke    HPI Peter Ryan is a 70 y.o. male with non-Hodgkin's lymphoma, diabetes, prior stroke who comes in with aphasia and weakness.  Patient states that he went to bed and woke up and initially felt normal but then around 1030 he tried to stand up and he noticed some increased weakness in his right leg and right arm.  His wife thought that he had a change in his speech as well.  This is constant, mild, nothing made it better, nothing made it worse.  Patient then called EMS.  He does report having a little bit of nausea recently.  Denies any chest pain, shortness of breath, abdominal pain, leg swelling.  Has had his COVID vaccines.  Denies being around anybody with Covid.  Patient did have one episode of vomiting in the CT scanner.          Past Medical History:  Diagnosis Date  . Arthritis   . BPH (benign prostatic hyperplasia)   . Cancer ([place])    Non-Hodgkins lymphoma  . Chronic prostatitis   . Cirrhosis of liver ([place])   . Diabetes mellitus without complication ([place])   . Dyslipidemia   . Elevated PSA   . Gastric reflux   . Gout   . HTN (hypertension)   . Hyperlipidemia   . Neuropathy   . Over weight   . Psoriasis   . Testicular pain, right   . Urinary frequency     Patient Active Problem List   Diagnosis Date Noted  . Left pontine cerebrovascular accident ([place]) 06/14/2020  . Facial droop   . Rectal bleeding   . Morbid obesity ([place])   . Right hemiparesis ([place])   . Controlled type 2 diabetes mellitus with hyperglycemia, without long-term current use of insulin ([place])   . Acute CVA (cerebrovascular accident) ([place]) 06/05/2020  . Obesity, Class III, BMI  40-49.9 (morbid obesity) ([place]) 06/05/2020  . Secondary malignant neoplasm of retroperitoneum and peritoneum ([place]) 05/02/2020  . Atherosclerosis of aorta ([place]) 05/02/2020  . Goals of care, counseling/discussion 04/08/2017  . 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 ([place]) 04/08/2017  . Ascites 02/08/2017  . Liver cirrhosis ([place]) 02/08/2017  . SBP (spontaneous bacterial peritonitis) ([place]) 02/07/2017  . 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  . DOE (dyspnea on exertion) 11/15/2016  . Obesity, morbid (more than 100 lbs over ideal weight or BMI > 40) ([place]) 11/15/2016  . Elevated PSA 05/30/2015  . BPH with obstruction/lower urinary tract symptoms 05/30/2015  . Type 2 diabetes mellitus ([place]) 05/18/2014  . Arthritis 01/03/2014  . Dyslipidemia 01/03/2014  . Benign essential hypertension 01/03/2014  . Gout 01/03/2014  . Psoriasis 01/03/2014  . Reflux 01/03/2014    Past Surgical History:  Procedure Laterality Date  . hydrocelectomy    . IR FLUORO GUIDE PORT INSERTION RIGHT  04/11/2017  . IR REMOVAL TUN ACCESS W/ PORT W/O FL MOD SED  05/29/2018  . PILONIDAL CYST EXCISION    . TEE WITHOUT CARDIOVERSION N/A 06/09/2020   Procedure: TRANSESOPHAGEAL ECHOCARDIOGRAM (TEE);  Surgeon: Minna Merritts, MD;  Location: [facility] ORS;  Service: Cardiovascular;  Laterality: N/A;    Prior to Admission medications   Medication Sig Start Date  End Date Taking? Authorizing Provider  acetaminophen (TYLENOL) 325 MG tablet Take 2 tablets (650 mg total) by mouth every 4 (four) hours as needed for mild pain (or temp > 37.5 C (99.5 F)). 07/10/20   Angiulli, Lavon Paganini, PA-C  allopurinol (ZYLOPRIM) 300 MG tablet Take 1 tablet (300 mg total) by mouth daily. 07/10/20   Angiulli, Lavon Paganini, PA-C  amLODipine (NORVASC) 10 MG tablet Take 1 tablet (10 mg total) by mouth daily. 07/10/20   Angiulli, Lavon Paganini, PA-C  aspirin EC 325 MG EC tablet Take 1 tablet (325 mg total) by mouth daily.  06/15/20   Sidney Ace, MD  atorvastatin (LIPITOR) 80 MG tablet TAKE ONE TABLET (80 MG) BY MOUTH EVERY DAY 09/04/20   [name]., MD  Cholecalciferol (VITAMIN D3) 25 MCG (1000 UT) CAPS Take 1 capsule (1,000 Units total) by mouth daily. 07/10/20   Angiulli, Lavon Paganini, PA-C  cloNIDine (CATAPRES) 0.1 MG tablet Take 1 tablet (0.1 mg total) by mouth 2 (two) times daily. 08/02/20   [name]., MD  finasteride (PROSCAR) 5 MG tablet Take 1 tablet (5 mg total) by mouth daily. 09/05/20   McGowan, Larene Beach A, PA-C  glimepiride (AMARYL) 2 MG tablet TAKE 1 TABLET BY MOUTH DAILY WITH BREAKFAST 09/04/20   [name]., MD  hydrocortisone 2.5 % lotion SMARTSIG:Sparingly Topical 3 Times a Week 07/31/20   [provider]  losartan (COZAAR) 100 MG tablet TAKE ONE TABLET EVERY DAY BY MOUTH 07/10/20   Angiulli, Lavon Paganini, PA-C  polyethylene glycol (MIRALAX / GLYCOLAX) 17 g packet Take 17 g by mouth daily. 07/11/20   Angiulli, Lavon Paganini, PA-C  vitamin B-12 (CYANOCOBALAMIN) 1000 MCG tablet Take 1 tablet (1,000 mcg total) by mouth daily. 09/04/20   [name]., MD    Allergies Hctz [hydrochlorothiazide] and Metformin  Family History  Problem Relation Age of Onset  . Uterine cancer Mother   . Diabetes Mother   . Cancer Mother   . Cancer Maternal Uncle   . Bladder Cancer Neg Hx   . Kidney disease Neg Hx   . Prostate cancer Neg Hx     Social History Social History   Tobacco Use  . Smoking status: Former Smoker    Packs/day: 1.50    Years: 8.00    Pack years: 12.00    Types: Cigarettes    Quit date: 05/29/1974    Years since quitting: 46.5  . Smokeless tobacco: Never Used  . Tobacco comment: quit 40 years ago  Vaping Use  . Vaping Use: Never used  Substance Use Topics  . Alcohol use: No    Alcohol/week: 0.0 standard drinks    Comment: rarely  . Drug use: No      Review of Systems Constitutional: No fever/chills Eyes: No visual changes. ENT:  No sore throat. Cardiovascular: Denies chest pain. Respiratory: Denies shortness of breath. Gastrointestinal: No abdominal pain.  Positive nausea and vomiting no diarrhea.  No constipation. Genitourinary: Negative for dysuria. Musculoskeletal: Negative for back pain. Skin: Negative for rash. Neurological: Right-sided weakness and aphasia All other ROS negative ____________________________________________   PHYSICAL EXAM:  VITAL SIGNS: Blood pressure (!) 154/74, pulse 85, temperature 98 F (36.7 C), temperature source Oral, resp. rate 19, height 5\' 10"  (1.778 m), weight 122.5 kg, SpO2 95 %.   Constitutional: Alert and oriented. Well appearing and in no acute distress. Eyes: Conjunctivae are normal. EOMI. Head: Atraumatic. Nose: No congestion/rhinnorhea. Mouth/Throat: Mucous membranes are moist.  Neck: No stridor. Trachea Midline. FROM Cardiovascular: Normal rate, regular rhythm. Grossly normal heart sounds.  Good peripheral circulation. Respiratory: Normal respiratory effort.  No retractions. Lungs CTAB. Gastrointestinal: Soft and nontender. No distention. No abdominal bruits.  Musculoskeletal: No lower extremity tenderness nor edema.  No joint effusions. Neurologic: NIH stroke scale is 2 for slight right leg weakness and facial palsy Skin:  Skin is warm, dry and intact. No rash noted. Psychiatric: Mood and affect are normal. Speech and behavior are normal. GU: Deferred   ____________________________________________   LABS (all labs ordered are listed, but only abnormal results are displayed)  Labs Reviewed  CBC - Abnormal; Notable for the following components:      Result Value   WBC 10.6 (*)    All other components within normal limits  DIFFERENTIAL - Abnormal; Notable for the following components:   Neutro Abs 7.8 (*)    All other components within normal limits  COMPREHENSIVE METABOLIC PANEL - Abnormal; Notable for the following components:   Potassium 3.0 (*)     Glucose, Bld 202 (*)    Calcium 8.5 (*)    All other components within normal limits  URINALYSIS, ROUTINE W REFLEX MICROSCOPIC - Abnormal; Notable for the following components:   Color, Urine YELLOW (*)    APPearance HAZY (*)    Specific Gravity, Urine >1.046 (*)    Glucose, UA 50 (*)    Protein, ur >=300 (*)    Bacteria, UA RARE (*)    All other components within normal limits  CBG MONITORING, ED - Abnormal; Notable for the following components:   Glucose-Capillary 168 (*)    All other components within normal limits  RESP PANEL BY RT-PCR (FLU A&B, COVID) ARPGX2  ETHANOL  PROTIME-INR  APTT  URINE DRUG SCREEN, QUALITATIVE ([facility] ONLY)  MAGNESIUM  TROPONIN I (HIGH SENSITIVITY)   ____________________________________________   ED ECG REPORT I, Vanessa [place], the attending physician, personally viewed and interpreted this ECG.  Normal sinus rate of 83, no ST elevation, some T wave inversion in lead III, QTC is 528 ____________________________________________  RADIOLOGY Robert Bellow, personally viewed and evaluated these images (plain radiographs) as part of my medical decision making, as well as reviewing the written report by the radiologist.  ED MD interpretation: No intracranial hemorrhage on CT  Official radiology report(s): MR BRAIN WO CONTRAST  Result Date: 11/30/2020 CLINICAL DATA:  Neuro deficit, acute stroke suspected. EXAM: MRI HEAD WITHOUT CONTRAST TECHNIQUE: Multiplanar, multiecho pulse sequences of the brain and surrounding structures were obtained without intravenous contrast. COMPARISON:  Same day head CT.  MRI June 08, 2020. FINDINGS: Mildly motion limited study. Brain: No acute infarction, hemorrhage, hydrocephalus, extra-axial collection or mass lesion. Advanced similar extensive T2/FLAIR hyperintensity within the white matter, compatible with chronic microvascular ischemic disease. Similar remote infarcts involving the right basal ganglia, bilateral thalami,  and left corona radiata. Focus of susceptibility in the right thalamus, compatible with prior microhemorrhage. Vascular: Better evaluated on same day CTA. Major arterial flow voids are maintained at the skull base. Skull and upper cervical spine: Normal marrow signal. Sinuses/Orbits: Sinuses are largely clear. Other: No sizable mastoid effusion. IMPRESSION: 1. No evidence of acute intracranial abnormality. 2. Similar advanced chronic microvascular ischemic disease and remote infarcts. Electronically Signed   By: Margaretha Sheffield MD   On: 11/30/2020 17:20   CT HEAD CODE STROKE WO CONTRAST  Result Date: 11/30/2020 CLINICAL DATA:  Code stroke.  Acute neuro deficit.  Rule out stroke. EXAM:  CT HEAD WITHOUT CONTRAST TECHNIQUE: Contiguous axial images were obtained from the base of the skull through the vertex without intravenous contrast. COMPARISON:  CT head 06/05/2020.  MRI 06/08/2020. FINDINGS: Brain: Generalized atrophy. Negative for hydrocephalus. Moderate to advanced white matter hypodensity diffusely and bilaterally appears chronic. Chronic infarct right basal ganglia. Chronic infarct midline pons, best seen on MRI. Negative for acute infarct, hemorrhage, mass. Vascular: Negative for hyperdense vessel. Atherosclerotic calcification in the carotid and vertebral arteries. Skull: Negative Sinuses/Orbits: Paranasal sinuses clear. No orbital lesion. Right cataract extraction. Other: None ASPECTS ([place] Stroke Program Early CT Score) - Ganglionic level infarction (caudate, lentiform nuclei, internal capsule, insula, M1-M3 cortex): 7 - Supraganglionic infarction (M4-M6 cortex): 3 Total score (0-10 with 10 being normal): 10 IMPRESSION: 1. No acute abnormality 2. ASPECTS is 10 3. Moderate to extensive chronic microvascular ischemic change in the white matter. 4. These results were called by telephone at the time of interpretation on 11/30/2020 at 3:25 pm to provider [facility] , who verbally acknowledged these results.  Electronically Signed   By: Franchot Gallo M.D.   On: 11/30/2020 15:26   CT ANGIO HEAD CODE STROKE  Addendum Date: 11/30/2020   ADDENDUM REPORT: 11/30/2020 16:06 ADDENDUM: These results were called by telephone at the time of interpretation on 11/30/2020 at 4:00 pm to provider [facility] , who verbally acknowledged these results. Electronically Signed   By: Kellie Simmering DO   On: 11/30/2020 16:06   Result Date: 11/30/2020 CLINICAL DATA:  Stroke/TIA, assess intracranial arteries. Stroke/TIA, assess extracranial arteries. EXAM: CT ANGIOGRAPHY HEAD AND NECK TECHNIQUE: Multidetector CT imaging of the head and neck was performed using the standard protocol during bolus administration of intravenous contrast. Multiplanar CT image reconstructions and MIPs were obtained to evaluate the vascular anatomy. Carotid stenosis measurements (when applicable) are obtained utilizing NASCET criteria, using the distal internal carotid diameter as the denominator. CONTRAST:  94mL OMNIPAQUE IOHEXOL 350 MG/ML SOLN COMPARISON:  Noncontrast head CT performed earlier today 11/30/2020. Brain MRI 06/08/2020. CT angiogram head/neck 06/06/2019 FINDINGS: CTA NECK FINDINGS Aortic arch: Standard aortic branching. No hemodynamically significant innominate or proximal subclavian artery stenosis. Right carotid system: CCA and ICA patent within the neck without significant stenosis (50% or greater). Mild soft and calcified plaque within the carotid bifurcation and proximal ICA. Left carotid system: CCA and ICA patent within the neck without significant stenosis (50% or greater). Mild soft and calcified plaque within the carotid bifurcation and proximal ICA. Vertebral arteries: The right vertebral artery is significantly dominant. Redemonstrated mild/moderate atherosclerotic narrowing of the proximal right V1 segment. Nonstenotic calcified plaque within the distal right V3 segment. The left vertebral artery is developmentally diminutive, but  patent within the neck. Nonstenotic calcified plaque at the origin of this vessel. Skeleton: Cervical spondylosis. No acute bony abnormality or aggressive osseous lesion. Other neck: No neck mass or cervical lymphadenopathy. Subcentimeter thyroid nodules not meeting consensus criteria for ultrasound follow-up. Upper chest: No consolidation within the imaged lung apices. Review of the MIP images confirms the above findings CTA HEAD FINDINGS Anterior circulation: The intracranial right internal carotid artery is patent. Mild calcified plaque within this vessel with no more than mild stenosis. The intracranial left internal carotid artery is patent. Soft plaque results in severe stenosis of the pre cavernous left ICA (series 8, image 110) (series 10, image 139). In retrospect, this stenosis was present on the prior CTA of 06/05/2020. Nonstenotic calcified plaque more distally within the distal cavernous and paraclinoid left ICA. The M1 middle  cerebral arteries are patent. No M2 proximal branch occlusion is identified. The anterior cerebral arteries are patent. There is atherosclerotic irregularity of the branch vessels. Most notably, there is redemonstration of a moderate stenosis within a proximal M2 left MCA branch (series 9, image 20). Also redemonstrated, there is a median artery of the corpus callosum with a moderate/severe stenosis distally (series 11, image 25). No intracranial aneurysm is identified. Posterior circulation: The dominant intracranial right vertebral artery is patent. Redemonstrated moderate stenosis within the V4 right vertebral artery. The non dominant V4 left vertebral artery is patent with redemonstration of multifocal severe stenoses. The basilar artery is patent. Fetal origin right posterior cerebral artery. The posterior cerebral arteries are patent. A right posterior communicating artery is present. The left posterior communicating artery is hypoplastic or absent. Venous sinuses: Within  the limitations of contrast timing, no convincing thrombus. Anatomic variants: As described Review of the MIP images confirms the above findings IMPRESSION: CTA neck: 1. The common carotid and internal carotid arteries are patent within the neck without hemodynamically significant stenosis. Mild atherosclerotic disease within the carotid systems within the neck as described. 2. Vertebral arteries patent within the neck. Redemonstrated mild/moderate atherosclerotic narrowing of the proximal V1 segment of the dominant right vertebral artery. The left vertebral artery is developmentally diminutive. CTA head: 1. No intracranial large vessel occlusion. 2. No significant interval change as compared to the CTA head/neck of 06/05/2020. 3. Intracranial atherosclerotic disease with multifocal stenoses, most notably as follows. 4. Severe atherosclerotic narrowing of the pre cavernous left internal carotid artery. 5. Moderate stenosis within a proximal M2 left MCA branch vessel. 6. Severe stenosis within the median artery of the corpus callosum somewhat distally. 7. Moderate atherosclerotic stenosis of the dominant V4 right vertebral artery. 8. Severe stenoses within the non dominant V4 left vertebral artery. Electronically Signed: By: Kellie Simmering DO On: 11/30/2020 15:59   CT ANGIO NECK CODE STROKE  Addendum Date: 11/30/2020   ADDENDUM REPORT: 11/30/2020 16:06 ADDENDUM: These results were called by telephone at the time of interpretation on 11/30/2020 at 4:00 pm to provider [facility] , who verbally acknowledged these results. Electronically Signed   By: Kellie Simmering DO   On: 11/30/2020 16:06   Result Date: 11/30/2020 CLINICAL DATA:  Stroke/TIA, assess intracranial arteries. Stroke/TIA, assess extracranial arteries. EXAM: CT ANGIOGRAPHY HEAD AND NECK TECHNIQUE: Multidetector CT imaging of the head and neck was performed using the standard protocol during bolus administration of intravenous contrast. Multiplanar CT  image reconstructions and MIPs were obtained to evaluate the vascular anatomy. Carotid stenosis measurements (when applicable) are obtained utilizing NASCET criteria, using the distal internal carotid diameter as the denominator. CONTRAST:  66mL OMNIPAQUE IOHEXOL 350 MG/ML SOLN COMPARISON:  Noncontrast head CT performed earlier today 11/30/2020. Brain MRI 06/08/2020. CT angiogram head/neck 06/06/2019 FINDINGS: CTA NECK FINDINGS Aortic arch: Standard aortic branching. No hemodynamically significant innominate or proximal subclavian artery stenosis. Right carotid system: CCA and ICA patent within the neck without significant stenosis (50% or greater). Mild soft and calcified plaque within the carotid bifurcation and proximal ICA. Left carotid system: CCA and ICA patent within the neck without significant stenosis (50% or greater). Mild soft and calcified plaque within the carotid bifurcation and proximal ICA. Vertebral arteries: The right vertebral artery is significantly dominant. Redemonstrated mild/moderate atherosclerotic narrowing of the proximal right V1 segment. Nonstenotic calcified plaque within the distal right V3 segment. The left vertebral artery is developmentally diminutive, but patent within the neck. Nonstenotic calcified plaque at the  origin of this vessel. Skeleton: Cervical spondylosis. No acute bony abnormality or aggressive osseous lesion. Other neck: No neck mass or cervical lymphadenopathy. Subcentimeter thyroid nodules not meeting consensus criteria for ultrasound follow-up. Upper chest: No consolidation within the imaged lung apices. Review of the MIP images confirms the above findings CTA HEAD FINDINGS Anterior circulation: The intracranial right internal carotid artery is patent. Mild calcified plaque within this vessel with no more than mild stenosis. The intracranial left internal carotid artery is patent. Soft plaque results in severe stenosis of the pre cavernous left ICA (series 8,  image 110) (series 10, image 139). In retrospect, this stenosis was present on the prior CTA of 06/05/2020. Nonstenotic calcified plaque more distally within the distal cavernous and paraclinoid left ICA. The M1 middle cerebral arteries are patent. No M2 proximal branch occlusion is identified. The anterior cerebral arteries are patent. There is atherosclerotic irregularity of the branch vessels. Most notably, there is redemonstration of a moderate stenosis within a proximal M2 left MCA branch (series 9, image 20). Also redemonstrated, there is a median artery of the corpus callosum with a moderate/severe stenosis distally (series 11, image 25). No intracranial aneurysm is identified. Posterior circulation: The dominant intracranial right vertebral artery is patent. Redemonstrated moderate stenosis within the V4 right vertebral artery. The non dominant V4 left vertebral artery is patent with redemonstration of multifocal severe stenoses. The basilar artery is patent. Fetal origin right posterior cerebral artery. The posterior cerebral arteries are patent. A right posterior communicating artery is present. The left posterior communicating artery is hypoplastic or absent. Venous sinuses: Within the limitations of contrast timing, no convincing thrombus. Anatomic variants: As described Review of the MIP images confirms the above findings IMPRESSION: CTA neck: 1. The common carotid and internal carotid arteries are patent within the neck without hemodynamically significant stenosis. Mild atherosclerotic disease within the carotid systems within the neck as described. 2. Vertebral arteries patent within the neck. Redemonstrated mild/moderate atherosclerotic narrowing of the proximal V1 segment of the dominant right vertebral artery. The left vertebral artery is developmentally diminutive. CTA head: 1. No intracranial large vessel occlusion. 2. No significant interval change as compared to the CTA head/neck of 06/05/2020.  3. Intracranial atherosclerotic disease with multifocal stenoses, most notably as follows. 4. Severe atherosclerotic narrowing of the pre cavernous left internal carotid artery. 5. Moderate stenosis within a proximal M2 left MCA branch vessel. 6. Severe stenosis within the median artery of the corpus callosum somewhat distally. 7. Moderate atherosclerotic stenosis of the dominant V4 right vertebral artery. 8. Severe stenoses within the non dominant V4 left vertebral artery. Electronically Signed: By: Kellie Simmering DO On: 11/30/2020 15:59    ____________________________________________   PROCEDURES  Procedure(s) performed (including Critical Care):  .1-3 Lead EKG Interpretation Performed by: Vanessa Miranda, MD Authorized by: Vanessa [place], MD     Interpretation: normal     ECG rate:  80s   ECG rate assessment: normal     Rhythm: sinus rhythm     Ectopy: none     Conduction: normal       ____________________________________________   INITIAL IMPRESSION / ASSESSMENT AND PLAN / ED COURSE  Peter Ryan was evaluated in Emergency Department on 11/30/2020 for the symptoms described in the history of present illness. He was evaluated in the context of the global COVID-19 pandemic, which necessitated consideration that the patient might be at risk for infection with the SARS-CoV-2 virus that causes COVID-19. Institutional protocols and algorithms that pertain to  the evaluation of patients at risk for COVID-19 are in a state of rapid change based on information released by regulatory bodies including the CDC and federal and state organizations. These policies and algorithms were followed during the patient's care in the ED.    Patient is a 70 year old with code stroke called in the field.  Patient automatically went to CT scan.  Differential includes hypoglycemia, electrolyte abnormalities, hemorrhagic stroke, ischemic stroke, recrudescence.  We will keep patient on the monitor to evaluate for  arrhythmia.  Initially when I saw patient he was able to lift the right leg up off the bed however later on the weakness seem to be worse and he was not able to lift that leg up off the bed  Dw Dr. Lorrin Goodell CT head and CTAs were negative.  Recommended MRI.  Ativan was given to help facilitate MRI MRI was negative.  Patient continues to have symptoms in the right leg.  Unable to lift the leg up off the bed now.  Discussed with neurology recommended admission for observation and will start on aspirin and Plavix.  Discussed the hospital team for admission     ____________________________________________   FINAL CLINICAL IMPRESSION(S) / ED DIAGNOSES   Final diagnoses:  Right sided weakness      MEDICATIONS GIVEN DURING THIS VISIT:  Medications  iohexol (OMNIPAQUE) 350 MG/ML injection 75 mL (75 mLs Intravenous Contrast Given 11/30/20 1522)  ondansetron (ZOFRAN) injection 4 mg (4 mg Intravenous Given 11/30/20 1525)  LORazepam (ATIVAN) injection 1 mg (1 mg Intravenous Given 11/30/20 1620)     ED Discharge Orders    None       Note:  This document was prepared using Dragon voice recognition software and may include unintentional dictation errors.   Vanessa [place], MD 11/30/20 [phone]

## 2020-11-30 NOTE — Progress Notes (Signed)
PHARMACIST - PHYSICIAN COMMUNICATION  CONCERNING:  Enoxaparin (Lovenox) for DVT Prophylaxis    RECOMMENDATION: Patient was prescribed enoxaprin 40mg  q24 hours for VTE prophylaxis.   Filed Weights   11/30/20 1535  Weight: 122.5 kg (270 lb 1 oz)    Body mass index is 38.75 kg/m.  Estimated Creatinine Clearance: 117.6 mL/min (by C-G formula based on SCr of 0.79 mg/dL).   Based on [place] patient is candidate for enoxaparin 0.5mg /kg TBW SQ every 24 hours based on BMI being >30.  DESCRIPTION: Pharmacy has adjusted enoxaparin dose per [facility] policy.  Patient is now receiving enoxaparin 62.5 mg every 24 hours   Benn Moulder, PharmD Pharmacy Resident  11/30/2020 7:00 PM

## 2020-11-30 NOTE — Code Documentation (Signed)
Pt arrives via EMS with right sided weakness, states he went to bed at 2200 WNL and woke up with symptoms, no tPA due to LKW > 4.5 hrs, pt vomiting in CT, zofran given, 20g left AC established for CTA, initial NIHSS 2 for right leg weakness and facial droop, report off to [employer], Q2 neuro checks

## 2020-11-30 NOTE — ED Notes (Signed)
Pt to MRI at this time.

## 2020-12-01 DIAGNOSIS — Z6841 Body Mass Index (BMI) 40.0 and over, adult: Secondary | ICD-10-CM | POA: Diagnosis not present

## 2020-12-01 DIAGNOSIS — Z9221 Personal history of antineoplastic chemotherapy: Secondary | ICD-10-CM | POA: Diagnosis not present

## 2020-12-01 DIAGNOSIS — Z7401 Bed confinement status: Secondary | ICD-10-CM | POA: Diagnosis not present

## 2020-12-01 DIAGNOSIS — R531 Weakness: Secondary | ICD-10-CM

## 2020-12-01 DIAGNOSIS — Z833 Family history of diabetes mellitus: Secondary | ICD-10-CM | POA: Diagnosis not present

## 2020-12-01 DIAGNOSIS — G459 Transient cerebral ischemic attack, unspecified: Secondary | ICD-10-CM | POA: Diagnosis present

## 2020-12-01 DIAGNOSIS — Z8572 Personal history of non-Hodgkin lymphomas: Secondary | ICD-10-CM | POA: Diagnosis not present

## 2020-12-01 DIAGNOSIS — R29702 NIHSS score 2: Secondary | ICD-10-CM | POA: Diagnosis present

## 2020-12-01 DIAGNOSIS — E1165 Type 2 diabetes mellitus with hyperglycemia: Secondary | ICD-10-CM | POA: Diagnosis present

## 2020-12-01 DIAGNOSIS — M6281 Muscle weakness (generalized): Secondary | ICD-10-CM | POA: Diagnosis not present

## 2020-12-01 DIAGNOSIS — Z7982 Long term (current) use of aspirin: Secondary | ICD-10-CM | POA: Diagnosis not present

## 2020-12-01 DIAGNOSIS — Z8049 Family history of malignant neoplasm of other genital organs: Secondary | ICD-10-CM | POA: Diagnosis not present

## 2020-12-01 DIAGNOSIS — E785 Hyperlipidemia, unspecified: Secondary | ICD-10-CM | POA: Diagnosis present

## 2020-12-01 DIAGNOSIS — N400 Enlarged prostate without lower urinary tract symptoms: Secondary | ICD-10-CM | POA: Diagnosis present

## 2020-12-01 DIAGNOSIS — D472 Monoclonal gammopathy: Secondary | ICD-10-CM | POA: Diagnosis present

## 2020-12-01 DIAGNOSIS — Z20822 Contact with and (suspected) exposure to covid-19: Secondary | ICD-10-CM | POA: Diagnosis present

## 2020-12-01 DIAGNOSIS — M109 Gout, unspecified: Secondary | ICD-10-CM | POA: Diagnosis present

## 2020-12-01 DIAGNOSIS — K219 Gastro-esophageal reflux disease without esophagitis: Secondary | ICD-10-CM | POA: Diagnosis not present

## 2020-12-01 DIAGNOSIS — Z79899 Other long term (current) drug therapy: Secondary | ICD-10-CM | POA: Diagnosis not present

## 2020-12-01 DIAGNOSIS — Z87891 Personal history of nicotine dependence: Secondary | ICD-10-CM | POA: Diagnosis not present

## 2020-12-01 DIAGNOSIS — R262 Difficulty in walking, not elsewhere classified: Secondary | ICD-10-CM | POA: Diagnosis not present

## 2020-12-01 DIAGNOSIS — Z7984 Long term (current) use of oral hypoglycemic drugs: Secondary | ICD-10-CM | POA: Diagnosis not present

## 2020-12-01 DIAGNOSIS — Z741 Need for assistance with personal care: Secondary | ICD-10-CM | POA: Diagnosis not present

## 2020-12-01 DIAGNOSIS — I69351 Hemiplegia and hemiparesis following cerebral infarction affecting right dominant side: Secondary | ICD-10-CM | POA: Diagnosis not present

## 2020-12-01 DIAGNOSIS — I69322 Dysarthria following cerebral infarction: Secondary | ICD-10-CM | POA: Diagnosis not present

## 2020-12-01 DIAGNOSIS — E1169 Type 2 diabetes mellitus with other specified complication: Secondary | ICD-10-CM | POA: Diagnosis present

## 2020-12-01 DIAGNOSIS — E119 Type 2 diabetes mellitus without complications: Secondary | ICD-10-CM | POA: Diagnosis not present

## 2020-12-01 DIAGNOSIS — E876 Hypokalemia: Secondary | ICD-10-CM | POA: Diagnosis present

## 2020-12-01 DIAGNOSIS — R278 Other lack of coordination: Secondary | ICD-10-CM | POA: Diagnosis not present

## 2020-12-01 DIAGNOSIS — M2550 Pain in unspecified joint: Secondary | ICD-10-CM | POA: Diagnosis not present

## 2020-12-01 DIAGNOSIS — Z8673 Personal history of transient ischemic attack (TIA), and cerebral infarction without residual deficits: Secondary | ICD-10-CM | POA: Diagnosis not present

## 2020-12-01 DIAGNOSIS — I10 Essential (primary) hypertension: Secondary | ICD-10-CM | POA: Diagnosis present

## 2020-12-01 LAB — BASIC METABOLIC PANEL
Anion gap: 12 (ref 5–15)
BUN: 10 mg/dL (ref 8–23)
CO2: 26 mmol/L (ref 22–32)
Calcium: 8.7 mg/dL — ABNORMAL LOW (ref 8.9–10.3)
Chloride: 102 mmol/L (ref 98–111)
Creatinine, Ser: 0.68 mg/dL (ref 0.61–1.24)
GFR, Estimated: >60 mL/min (ref >60)
Glucose, Bld: 142 mg/dL — ABNORMAL HIGH (ref 70–99)
Potassium: 2.7 mmol/L — CL (ref 3.5–5.1)
Sodium: 140 mmol/L (ref 135–145)

## 2020-12-01 LAB — CBC
HCT: 44.1 % (ref 39.0–52.0)
Hemoglobin: 14.9 g/dL (ref 13.0–17.0)
MCH: 27 pg (ref 26.0–34.0)
MCHC: 33.8 g/dL (ref 30.0–36.0)
MCV: 80 fL (ref 80.0–100.0)
Platelets: 236 10*3/uL (ref 150–400)
RBC: 5.51 MIL/uL (ref 4.22–5.81)
RDW: 15.2 % (ref 11.5–15.5)
WBC: 10.2 10*3/uL (ref 4.0–10.5)
nRBC: 0 % (ref 0.0–0.2)

## 2020-12-01 LAB — LIPID PANEL
Cholesterol: 153 mg/dL (ref 0–200)
HDL: 28 mg/dL — ABNORMAL LOW (ref >40)
LDL Cholesterol: 71 mg/dL (ref 0–99)
Total CHOL/HDL Ratio: 5.5 RATIO
Triglycerides: 270 mg/dL — ABNORMAL HIGH (ref <150)
VLDL: 54 mg/dL — ABNORMAL HIGH (ref 0–40)

## 2020-12-01 LAB — GLUCOSE, CAPILLARY
Glucose-Capillary: 176 mg/dL — ABNORMAL HIGH (ref 70–99)
Glucose-Capillary: 167 mg/dL — ABNORMAL HIGH (ref 70–99)
Glucose-Capillary: 156 mg/dL — ABNORMAL HIGH (ref 70–99)
Glucose-Capillary: 152 mg/dL — ABNORMAL HIGH (ref 70–99)

## 2020-12-01 LAB — HEMOGLOBIN A1C
Hgb A1c MFr Bld: 7 % — ABNORMAL HIGH (ref 4.8–5.6)
Mean Plasma Glucose: 154.2 mg/dL

## 2020-12-01 LAB — MAGNESIUM: Magnesium: 2.1 mg/dL (ref 1.7–2.4)

## 2020-12-01 LAB — POTASSIUM: Potassium: 3.4 mmol/L — ABNORMAL LOW (ref 3.5–5.1)

## 2020-12-01 MED ORDER — POTASSIUM CHLORIDE 10 MEQ/100ML IV SOLN
10.0000 meq | INTRAVENOUS | Status: DC
  Administered 2020-12-01 (×4): 10 meq via INTRAVENOUS
  Filled 2020-12-01 (×4): qty 100

## 2020-12-01 MED ORDER — POTASSIUM CHLORIDE 20 MEQ PO PACK
40.0000 meq | PACK | Freq: Every day | ORAL | Status: DC
  Administered 2020-12-01: 40 meq via ORAL
  Filled 2020-12-01: qty 2

## 2020-12-01 MED ORDER — ENOXAPARIN SODIUM 60 MG/0.6ML ~~LOC~~ SOLN
60.0000 mg | SUBCUTANEOUS | Status: DC
  Administered 2020-12-02 – 2020-12-03 (×2): 60 mg via SUBCUTANEOUS
  Filled 2020-12-01 (×2): qty 0.6

## 2020-12-01 MED ORDER — POTASSIUM CHLORIDE CRYS ER 20 MEQ PO TBCR: 40.0000 meq | EXTENDED_RELEASE_TABLET | Freq: Every day | ORAL | Status: DC

## 2020-12-01 MED ORDER — POTASSIUM CHLORIDE 10 MEQ/100ML IV SOLN
10.0000 meq | INTRAVENOUS | Status: AC
  Administered 2020-12-01: 10 meq via INTRAVENOUS
  Filled 2020-12-01 (×2): qty 100

## 2020-12-01 MED ORDER — MAGNESIUM OXIDE 400 (241.3 MG) MG PO TABS
800.0000 mg | ORAL_TABLET | Freq: Every day | ORAL | Status: DC
  Administered 2020-12-01 – 2020-12-04 (×4): 800 mg via ORAL
  Filled 2020-12-01 (×4): qty 2

## 2020-12-01 NOTE — Evaluation (Addendum)
Physical Therapy Evaluation Patient Details Name: Peter Ryan MRN: [id number] DOB: 28-Jan-1951 Today's Date: 12/01/2020   History of Present Illness  70 year old gentleman with history of left pontine stroke with residual right-sided hemiparesis and dysarthria since 9/21, diffuse large B-cell lymphoma status post chemotherapy, hypertension, type 2 diabetes, hyperlipidemia, BPH who presented to the hospital with worsening right-sided weakness from baseline.  Had several episodes of nausea without vomiting, felt dizzy with positional change while in the emergency room.  At the emergency room he was found with obvious right-sided hemiparesis.  Potassium 3.  Magnesium 1.5.  CT scan and subsequent MRI were not showing any new stroke.  Admitted as TIA.     Clinical Impression  Pt seen for PT evaluation with wife present for session (pt noted to have improvement in K+ levels since this AM). Pt motivated to participate & requires +2 assist for bed mobility & sit<>stand at EOB. Pt is able to weight bear without R knee buckling and is able to slightly weight shift to L to lift RLE off of floor. Pt would benefit from CIR level of therapies to maximize independence with mobility & reduce fall risk & caregiver burden upon return home. Will continue to follow pt acutely to progress bed mobility, transfers, & gait as able.      Follow Up Recommendations CIR    Equipment Recommendations   (TBD in next venue)    Recommendations for Other Services Rehab consult     Precautions / Restrictions Precautions Precautions: Fall Precaution Comments: R hemi Restrictions Weight Bearing Restrictions: No      Mobility  Bed Mobility Overal bed mobility: Needs Assistance Bed Mobility: Supine to Sit;Sit to Supine     Supine to sit: Max assist;HOB elevated Sit to supine: Max assist;+2 for physical assistance;HOB elevated   General bed mobility comments: Difficulty elevating trunk during supine>sit     Transfers Overall transfer level: Needs assistance Equipment used: Rolling walker (2 wheeled) Transfers: Sit to/from Stand Sit to Stand: Max assist;From elevated surface;+2 physical assistance         General transfer comment: PT blocking R knee, assistance with placing RUE on RW but difficulty gripping AD, R foot externally rotates  Ambulation/Gait                Stairs            Wheelchair Mobility    Modified Rankin (Stroke Patients Only)       Balance Overall balance assessment: Needs assistance Sitting-balance support: Feet supported;Single extremity supported Sitting balance-Leahy Scale: Poor Sitting balance - Comments: Max cuing for midline orientation & to correct R lateral lean Postural control: Right lateral lean   Standing balance-Leahy Scale: Zero Standing balance comment: Max assist +2, R lateral lean with poor ability to correct                             Pertinent Vitals/Pain Pain Assessment: No/denies pain    Home Living Family/patient expects to be discharged to:: Private residence Living Arrangements: Spouse/significant other Available Help at Discharge: Family;Available 24 hours/day Type of Home: House Home Access: Ramped entrance Entrance Stairs-Rails: Right Entrance Stairs-Number of Steps: previously 3 steps, ramp is new since rehab Home Layout: One level Home Equipment: Walker - 4 wheels;Cane - single point;Other (comment);Bedside commode      Prior Function Level of Independence: Needs assistance   Gait / Transfers Assistance Needed: Pt states that since rehab, he has  been able to complete some short HH distances with RW like ~20-30' in the home. Able to pivot himself.    Comments: sleeps in lift chair/recliner.     Hand Dominance   Dominant Hand: Right    Extremity/Trunk Assessment   Upper Extremity Assessment Upper Extremity Assessment: RUE deficits/detail;LUE deficits/detail RUE Deficits /  Details: shld flexion to ~1/3 range. MMT of elbow, wrist and hand grossly 3-/5 (somewhat decreased at baseline d/t h/o stroke, but pt presents with worse fince motor coordination and grip strength this date than his typical hemiparesis). RUE Sensation: decreased proprioception RUE Coordination: decreased fine motor;decreased gross motor LUE Deficits / Details: grossly WFL    Lower Extremity Assessment Lower Extremity Assessment: RLE deficits/detail RLE Deficits / Details: impaired proprioception & sensation, 2/5 ankle dorsiflexion, 2/5 knee extension & hip flexion in sitting       Communication   Communication: No difficulties  Cognition Arousal/Alertness: Awake/alert Behavior During Therapy: WFL for tasks assessed/performed Overall Cognitive Status: Within Functional Limits for tasks assessed                                       General Comments General comments (skin integrity, edema, etc.): Incontinent of urine/BM (wife reports it's mucousy) upon standing & assisted with changing pads out - nurse made aware of incontinence    Exercises    Assessment/Plan    PT Assessment Patient needs continued PT services  PT Problem List Decreased strength;Decreased mobility;Decreased range of motion;Decreased coordination;Obesity;Decreased knowledge of precautions;Decreased activity tolerance;Cardiopulmonary status limiting activity;Decreased knowledge of use of DME;Impaired sensation;Decreased balance       PT Treatment Interventions DME instruction;Therapeutic activities;Gait training;Therapeutic exercise;Patient/family education;Stair training;Balance training;Functional mobility training;Neuromuscular re-education;Manual techniques;Wheelchair mobility training    PT Goals (Current goals can be found in the Care Plan section)  Acute Rehab PT Goals Patient Stated Goal: to get better, at least back to how I was after rehab PT Goal Formulation: With patient/family Time  For Goal Achievement: 12/15/20 Potential to Achieve Goals: Good    Frequency 7X/week   Barriers to discharge Decreased caregiver support      Co-evaluation               AM-PAC PT "6 Clicks" Mobility  Outcome Measure Help needed turning from your back to your side while in a flat bed without using bedrails?: Total Help needed moving from lying on your back to sitting on the side of a flat bed without using bedrails?: Total Help needed moving to and from a bed to a chair (including a wheelchair)?: Total Help needed standing up from a chair using your arms (e.g., wheelchair or bedside chair)?: Total Help needed to walk in hospital room?: Total Help needed climbing 3-5 steps with a railing? : Total 6 Click Score: 6    End of Session Equipment Utilized During Treatment: Gait belt Activity Tolerance: Patient tolerated treatment well Patient left: in bed;with call bell/phone within reach;with bed alarm set;with family/visitor present Nurse Communication: Mobility status PT Visit Diagnosis: Muscle weakness (generalized) (M62.81);Other abnormalities of gait and mobility (R26.89);Difficulty in walking, not elsewhere classified (R26.2);Unsteadiness on feet (R26.81);Hemiplegia and hemiparesis BPPV - Right/Left : Right Hemiplegia - Right/Left: Right Hemiplegia - dominant/non-dominant: Dominant Hemiplegia - caused by: Unspecified    Time: 9485-4627 PT Time Calculation (min) (ACUTE ONLY): 30 min   Charges:   PT Evaluation $PT Eval Moderate Complexity: 1 Mod PT Treatments $Neuromuscular Re-education:  8-22 mins        Lavone Nian, PT, DPT 12/01/20, 4:53 PM   [name] 12/01/2020, 4:36 PM

## 2020-12-01 NOTE — Evaluation (Addendum)
Clinical/Bedside Swallow Evaluation Patient Details  Name: Peter Ryan MRN: [id number] Date of Birth: 1950/11/21  Today's Date: 12/01/2020 Time: SLP Start Time (ACUTE ONLY): 37 SLP Stop Time (ACUTE ONLY): 1320 SLP Time Calculation (min) (ACUTE ONLY): 60 min  Past Medical History:  Past Medical History:  Diagnosis Date  . Arthritis   . BPH (benign prostatic hyperplasia)   . Cancer ([place])    Non-Hodgkins lymphoma  . Chronic prostatitis   . Cirrhosis of liver ([place])   . Diabetes mellitus without complication ([place])   . Dyslipidemia   . Elevated PSA   . Gastric reflux   . Gout   . HTN (hypertension)   . Hyperlipidemia   . Neuropathy   . Over weight   . Psoriasis   . Testicular pain, right   . Urinary frequency    Past Surgical History:  Past Surgical History:  Procedure Laterality Date  . hydrocelectomy    . IR FLUORO GUIDE PORT INSERTION RIGHT  04/11/2017  . IR REMOVAL TUN ACCESS W/ PORT W/O FL MOD SED  05/29/2018  . PILONIDAL CYST EXCISION    . TEE WITHOUT CARDIOVERSION N/A 06/09/2020   Procedure: TRANSESOPHAGEAL ECHOCARDIOGRAM (TEE);  Surgeon: Minna Merritts, MD;  Location: [facility] ORS;  Service: Cardiovascular;  Laterality: N/A;   HPI:  Pt is a 70 y.o. male with medical history including Left pontine stroke with residual right-sided weakness and dysarthria (05/2020), DM, Obesity, diffuse large B-cell lymphoma s/p 6 cycles R-CHOP, T2DM, HTN, HLD, IgA MGUS, BPH who presents to the ED for evaluation of worsening right-sided weakness from baseline.  Patient states he has residual right-sided weakness since his stroke in September 2021.  This morning around 10 AM when he was getting out of bed to walk with his walker, he noticed worsening weakness in his right arm and leg compared to baseline.  He did not have any change in speech from baseline or new facial weakness.  He was concerned about recurrent stroke therefore came to the ED for further evaluation.  Pt has been receiving HH  then Outpatient PT services per chart notes.  MRI: No evidence of acute intracranial abnormality.  2. Similar advanced chronic microvascular ischemic disease and  remote infarcts.   Assessment / Plan / Recommendation Clinical Impression  Pt appears to present w/ grossly adequate oropharyngeal phase swallow w/ No overt oropharyngeal phase dysphagia noted. Pt consumed po trials w/ No immediate, overt clinical s/s of aspiration during po trials and no reported difficulty swallowing consistencies/trials given. Pt appears at reduced risk for aspiration following general aspiration precautions including NO STRAW USE and focusing on following the general aspiration precautions(pt endorsed eating too much too fast at home). Pt has a Baseline Left pontine CVA(05/2020) w/ residual Right sided weakness including decreased labial tone on R side. This did not appear to impact his oral phase of swallowing. Pt was seen by ST services while at CIR(05/2020 - see CIR notes) then Discharged from services. During po trials, pt fed self and consumed all consistencies w/ no decline in vocal quality or change in respiratory presentation during/post trials. No cough. Oral phase appeared [facility] w/ timely bolus management, mastication, and control of bolus propulsion for A-P transfer for swallowing. Oral clearing achieved w/ all trial consistencies. Educated on Small bites = better oral control of boluses. OM Exam appeared [facility] w/ no unilateral lingual weakness noted; min R labial decreased tone at rest. Pt fed self w/ setup support d/t RUE weakness. Recommend a more Mech  Soft/Regular consistency diet w/ well-Cut meats/foods, moistened foods; Thin liquids VIA CUP - pt is NOT to use Straws for better control when drinking. Recommend general aspiration precautions, Pills WHOLE in Puree for safer, easier swallowing -- this was practiced w/ NSG in room. Wife (later on) and pt/NSG given instruction that ONLY a Puree is recommended from this point  forward for pt's safety w/ Pill swallowing. Education given on Pills in Puree; food consistencies and easy to eat options; general aspiration precautions; handouts given. As pt appears at/near his Baseline, ST services will s/o w/ NSG to reconsult if any new needs arise. Pt/NSG then Wife agreed.  Currently, pt is verbal and communicating all of his wants/needs appropriately to others. Follows instructions w/ intermittent guide/cue. He was able to indicate need for following general aspiration precautions and give 3-4 precautions independently. The R decreased labial tone is Baseline for pt; could be slightly more pronounced per Neurology/MD at this time d/t acute illness. Speech intelligibility is appropriate however. Encouraged pt to remember to use his strategies of increased volume and over-articulation during speech. Noted CIR notes and Discharge in 05/2020. See chart. Recommend f/u w/ PCP and referral to Outpatient ST services if any new change from his baseline if felt by pt. Both pt and Wife agreed.     Aspiration Risk   (reduced following general precautions)    Diet Recommendation  Regular diet w/ well-Cut meats, foods and moistened w/ Gravies; Thin liquids VIA CUP ONLY. General aspiration precautions, Reflux precautions. Tray setup and support at meals d/t RUE weakness.  Medication Administration: Whole meds with puree (for safer swallowing in general)    Other  Recommendations Recommended Consults:  (Dietician f/u) Oral Care Recommendations: Oral care BID;Patient independent with oral care Other Recommendations:  (n/a)   Follow up Recommendations None      Frequency and Duration  (n/a)   (n/a)       Prognosis Prognosis for Safe Diet Advancement: Good Barriers to Reach Goals: Time post onset;Severity of deficits;Cognitive deficits      Swallow Study   General Date of Onset: 11/30/20 HPI: Pt is a 70 y.o. male with medical history including Left pontine stroke with residual  right-sided weakness and dysarthria (05/2020), DM, Obesity, diffuse large B-cell lymphoma s/p 6 cycles R-CHOP, T2DM, HTN, HLD, IgA MGUS, BPH who presents to the ED for evaluation of worsening right-sided weakness from baseline.  Patient states he has residual right-sided weakness since his stroke in September 2021.  This morning around 10 AM when he was getting out of bed to walk with his walker, he noticed worsening weakness in his right arm and leg compared to baseline.  He did not have any change in speech from baseline or new facial weakness.  He was concerned about recurrent stroke therefore came to the ED for further evaluation.  Pt has been receiving HH then Outpatient PT services per chart notes.  MRI: No evidence of acute intracranial abnormality.  2. Similar advanced chronic microvascular ischemic disease and  remote infarcts. Type of Study: Bedside Swallow Evaluation Previous Swallow Assessment: 05/2020 Diet Prior to this Study: Regular;Thin liquids Temperature Spikes Noted: No (wbc 10.2) Respiratory Status: Room air History of Recent Intubation: No Behavior/Cognition: Alert;Cooperative;Pleasant mood;Requires cueing (intermittent) Oral Cavity Assessment: Within Functional Limits Oral Care Completed by SLP: Recent completion by staff Oral Cavity - Dentition: Adequate natural dentition Vision: Functional for self-feeding Self-Feeding Abilities: Able to feed self;Needs assist;Needs set up (RUE weakness) Patient Positioning: Upright in bed (needed  positioning) Baseline Vocal Quality: Normal Volitional Cough: Strong Volitional Swallow: Able to elicit    Oral/Motor/Sensory Function Overall Oral Motor/Sensory Function: Mild impairment Facial ROM: Reduced right Facial Symmetry: Abnormal symmetry right Facial Strength: Reduced right Lingual ROM: Within Functional Limits Lingual Symmetry: Within Functional Limits Lingual Strength: Within Functional Limits Velum: Within Functional  Limits Mandible: Within Functional Limits   Ice Chips Ice chips: Within functional limits Presentation: Spoon (fed; 3 trials)   Thin Liquid Thin Liquid: Within functional limits Presentation: Cup;Self Fed (~4+ ozs)    Nectar Thick Nectar Thick Liquid: Not tested   Honey Thick Honey Thick Liquid: Not tested   Puree Puree: Within functional limits Presentation: Self Fed;Spoon (7 trials)   Solid     Solid: Within functional limits (grossly) Presentation: Self Fed (1/2 of a sandwich) Other Comments: turkey/cheese        Orinda Kenner, MS, [employer] Re[facility] Services [phone] [place] 12/01/2020,3:30 PM

## 2020-12-01 NOTE — Evaluation (Addendum)
Occupational Therapy Evaluation Patient Details Name: Peter Ryan MRN: [id number] DOB: 06-18-1951 Today's Date: 12/01/2020    History of Present Illness Pt is a 70 y/o M with PMH: obesity, BPH, HTN, DM, DLD, non-Hodgkin's lymphoma s/p chemo, and h/o L pontine stroke in Sept 2021, with R aided Hemi-paresis (for which he recieved follow-up inpt therapy at [facility]). Pt presents to [facility] at this time d/t worsening R Sided weakness, several episodes of nausea without vomiting, and feeling dizzy with positional change. W/u included: CT with no acute findings, MRI of the brain with no acute ischemia, but does show chronic microvascular changes, and CTA of the head and neck with atherosclerosis but No large vessel occlusion. Pt being treated for hypokalemia/hypoamagnesemia.   Clinical Impression   Pt seen for OT evaluation this date in setting of acute hospitalization for potential TIA. W/u imaging largely unremarkable, but pt noted to have E'lyte imbalances that he is currently being treated for. Pt spouse present throughout assessment and assists pt in reporting PLOF. Pt was seen at this hospital for CVA in September. Since then, the couple have added a ramp to their single story home. Pt and spouse report that he was able to pivot himself with the RW and walk ~20-30 feet (short household distances) and required some assist from spouse for LB ADLs, but if given enough time, could perform LB self care with AE as he'd learned in acute rehab. Pt presents this date with more pronounced R sided weakness and fine motor coordination impairments than his baseline, decreased sitting balance, general deconditioning and general decreased activity tolerance. Pt requires MOD A To come to sitting with HOB elevated and TOTAL A +2 to return to bed. He only tolerates ~5-6 mins static EOB sitting with MIN/MOD A support from OT to sustain. Pt is moderately aware of when he starts to lean/lose sitting balance, but  only able to temporarily right himself before leaning resumes. Pt requires MOD A for seated UB ADLs and MAX to TOTAL A For seated LB ADLs at this time 2/2 decreased balance and R Side strength. Anticipate pt will require extensive rehabilitation upon d/c from hospital. At this time, recommending CIR f/u to increase strength and tolerance to highest attainable/most functional level.     Follow Up Recommendations  CIR    Equipment Recommendations  Other (comment) (defer to next level of care)    Recommendations for Other Services Rehab consult     Precautions / Restrictions Precautions Precautions: Fall Restrictions Weight Bearing Restrictions: No      Mobility Bed Mobility Overal bed mobility: Needs Assistance Bed Mobility: Supine to Sit;Sit to Supine     Supine to sit: Mod assist;HOB elevated Sit to supine: Total assist;+2 for physical assistance   General bed mobility comments: Pt requires MAX A For back to bed as he is unable to scoot toward his R and ultimately FOB has to be removed to allow him enough space to get LEs back to bed and then 2p assist to propel towards HOB.    Transfers                 General transfer comment: deferred as pt with very poor sitting balance and tolerance, fatiguing after ~5 mins EOB sitting with OT Support    Balance Overall balance assessment: Needs assistance Sitting-balance support: Feet supported;Single extremity supported Sitting balance-Leahy Scale: Poor Sitting balance - Comments: R lateral lean very pronounced and consistent, pt notably tries to right himself and return  to midline, but within seconds, starts to assume lean and nearly lose balance, requires at least MIN A but mostly MOD A to sustain static sit throughout and only tolerates ~5-6 mins       Standing balance comment: deferred as pt with very poor sitting balance and tolerance. Anticpate 2p assist needed.                           ADL either performed  or assessed with clinical judgement   ADL Overall ADL's : Needs assistance/impaired                                       General ADL Comments: requires MOD A For seated UB ADLs as well as MIN/MOD A to sustain static sitting balance. Pt requires MAX A to TOTAL A for LB ADLs at this time d/t decreased dynamic and static sitting balance as well as decreased R LE strength and ROM d/t h/o stroke.     Vision Baseline Vision/History: Wears glasses Wears Glasses: At all times Patient Visual Report: No change from baseline Vision Assessment?: Yes Eye Alignment: Within Functional Limits Ocular Range of Motion: Within Functional Limits Alignment/Gaze Preference: Within Defined Limits Tracking/Visual Pursuits: Able to track stimulus in all quads without difficulty     Perception     Praxis      Pertinent Vitals/Pain Pain Assessment: No/denies pain     Hand Dominance Right   Extremity/Trunk Assessment Upper Extremity Assessment Upper Extremity Assessment: RUE deficits/detail;LUE deficits/detail RUE Deficits / Details: shld flexion to ~1/3 range. MMT of elbow, wrist and hand grossly 3-/5 (somewhat decreased at baseline d/t h/o stroke, but pt presents with worse fince motor coordination and grip strength this date than his typical hemiparesis). RUE Sensation: decreased proprioception RUE Coordination: decreased fine motor;decreased gross motor LUE Deficits / Details: grossly [facility]           Communication Communication Communication: Other (comment) (slow and deliberate cadence with speech. Some confusion that wife assists with, but mostly appropriate and able to communicate apprpriately and make needs known.)   Cognition Arousal/Alertness: Awake/alert Behavior During Therapy: WFL for tasks assessed/performed Overall Cognitive Status: Within Functional Limits for tasks assessed                                 General Comments: pt is oriented and mostly  appropriate, but is noted to have some confused speech this date which his wife will sometimes have to correct. Not slurred, but sometimes word choice error.   General Comments       Exercises Other Exercises Other Exercises: OT facilitates ed with pt and spouse re: OT POC and recommendations including potential need for rehabilitation following hospital stay as pt with mobility significantly limited in setting of gross weakness with R side increased versus baseline hemiparesis. Pt and spouse with good understanding, ask appropriate questions and agreeable to potential rehab needs.   Shoulder Instructions      Home Living Family/patient expects to be discharged to:: Private residence Living Arrangements: Spouse/significant other Available Help at Discharge: Family;Available 24 hours/day Type of Home: House Home Access: Ramped entrance Entrance Stairs-Number of Steps: previously 3 steps, ramp is new since rehab Entrance Stairs-Rails: Right Home Layout: One level     Bathroom Shower/Tub: Tub/shower unit  Bathroom Toilet:  (BSC placed over commode to elevate/use handles.)     Home Equipment: Walker - 4 wheels;Cane - single point;Other (comment);Bedside commode (lift chair that he sleeps in)          Prior Functioning/Environment Level of Independence: Needs assistance  Gait / Transfers Assistance Needed: Pt states that since rehab, he has been able to complete some short HH distances with RW like ~20-30' in the home. Able to pivot himself. ADL's / Homemaking Assistance Needed: since last stroke and time of rehabilitation, pt has been requiring assist from his spouse for LB ADLs including dressing and bathing. They both state that he was able to with AE after rehab, but they've not been using it and she just does that for him for convenience. Communication / Swallowing Assistance Needed: Some confused speech this date, pt is noted to look to his wife to correct some of the  information, and she even states "he isn't speaking correctly" at which time she'll offer clarification on PLOF/home setup information Comments: sleeps in lift chair/recliner.        OT Problem List: Decreased strength;Decreased range of motion;Decreased activity tolerance;Impaired balance (sitting and/or standing);Decreased coordination;Impaired tone;Impaired UE functional use;Increased edema      OT Treatment/Interventions: Self-care/ADL training;DME and/or AE instruction;Balance training;Therapeutic activities;Therapeutic exercise;Neuromuscular education;Energy conservation;Patient/family education    OT Goals(Current goals can be found in the care plan section) Acute Rehab OT Goals Patient Stated Goal: to get better, at least back to how I was after rehab OT Goal Formulation: With patient/family Time For Goal Achievement: 12/15/20 Potential to Achieve Goals: Good ADL Goals Pt Will Perform Grooming: with supervision;sitting (with R UE support as needed.) Pt Will Perform Upper Body Dressing: with min guard assist;sitting (with modified hemi dressing technique) Pt Will Transfer to Toilet: with min assist;with mod assist;stand pivot transfer;bedside commode (with LRAD) Pt/caregiver will Perform Home Exercise Program: Right Upper extremity;With written HEP provided [facility] HEP to R hand to increase coordination and strength with <25% verbal/visual cues.) Additional ADL Goal #1: Pt will tolerate further mobilization (at least sit to stand with MAX A +2) to allow for greater development of OT POC.  OT Frequency: Min 3X/week   Barriers to D/C:            Co-evaluation              AM-PAC OT "6 Clicks" Daily Activity     Outcome Measure Help from another person eating meals?: A Little Help from another person taking care of personal grooming?: A Lot Help from another person toileting, which includes using toliet, bedpan, or urinal?: Total Help from another person bathing (including  washing, rinsing, drying)?: A Lot Help from another person to put on and taking off regular upper body clothing?: A Lot Help from another person to put on and taking off regular lower body clothing?: Total 6 Click Score: 11   End of Session Nurse Communication: Mobility status;Other (comment) (notified RN and MD about pt's current functional tolerance, balance, and need for 2p assist.)  Activity Tolerance: Patient tolerated treatment well;Patient limited by fatigue Patient left: in bed;with call bell/phone within reach;with bed alarm set  OT Visit Diagnosis: Unsteadiness on feet (R26.81);Muscle weakness (generalized) (M62.81);Other symptoms and signs involving the nervous system (R29.898);Hemiplegia and hemiparesis Hemiplegia - Right/Left: Right Hemiplegia - dominant/non-dominant: Dominant Hemiplegia - caused by: Cerebral infarction;Unspecified                Time: 5176-1607 OT Time Calculation (min): 59 min  Charges:  OT General Charges $OT Visit: 1 Visit OT Evaluation $OT Eval Moderate Complexity: 1 Mod OT Treatments $Self Care/Home Management : 23-37 mins $Therapeutic Activity: 23-37 mins  Gerrianne Scale, MS, OTR/L ascom [phone] 12/01/20, 4:21 PM

## 2020-12-01 NOTE — Progress Notes (Signed)
Critical K level  Of 2.7 received from the lab c/o Rasha Ssan at [phone]. Result relayed to NP Ouma at [phone]. Orders in epic.

## 2020-12-01 NOTE — Progress Notes (Signed)
Admitted patient from ED, transported via stretcher, accompanied by RN. Pt is aox4, denies pain, VSS. NIH scale is 2 with rt leg weakness and facial droop. Pt failed swallow screen in ED and maintained on NPO. Call bell placed within reach, bed alarm activated. Orientation given.

## 2020-12-01 NOTE — Progress Notes (Signed)
PT Cancellation Note  Patient Details Name: Peter Ryan MRN: [id number] DOB: [birth year]   Cancelled Treatment:    Reason Eval/Treat Not Completed: Patient not medically ready PT orders received, chart reviewed. Pt noted to have critically low K+ (2.7) & exertional activity contraindicated at this time. Will f/u as able & as pt is medically stable.  Peter Ryan, PT, DPT 12/01/20, 8:25 AM    [name] 12/01/2020, 8:24 AM

## 2020-12-01 NOTE — Progress Notes (Signed)
NEUROLOGY CONSULTATION PROGRESS NOTE   Date of service: December 01, 2020 Patient Name: Peter Ryan MRN:  [id number] DOB:  05-21-1951  Brief HPI   Peter Ryan is a 70 y.o. male with PMH significant for right-handed white male with a history of diabetes, hypertension, obesity, and dyslipidemia, prior left paramedian pontine ischemic infarction and a subcentimeter deep white matter ischemic infarction in the right ACA territory in sept 2021 with residual R sided weakness and dysarthria who presents as a stroke code with worsening of his R sided weakness, reports symptoms improving by the time he got to the ED. Exam with R hand grip mildly weak compared to left. RLE drift and mild flattening of R nasolabial fold. All of his deficit that was noted was worsening of his prior stroke symptoms. No other new deficit. R leg is the same as it is at baseline. R hand is about a 9/10 compared to his baseline. He had an episode of nausea with vomiting in the CT scanner.   Interval Hx   MRI Brain was negative for an acute stroke. He was given Ativan for MRI Brain. He was noted to have more somnolence and R leg weakness after the MRI. He was observed overnight.  Exam does look worse with his R arm weaker and R leg weaker than my initial evaluation. He feels that this is same as his prior stroke deficit.  Vitals   Vitals:   12/01/20 0235 12/01/20 0424 12/01/20 0624 12/01/20 0738  BP: (!) 156/62 (!) 159/72 (!) 167/78 134/75  Pulse: 71 75 88 84  Resp: 17 20 20 17   Temp: (!) 97.5 F (36.4 C) 97.7 F (36.5 C) 98 F (36.7 C) 98.5 F (36.9 C)  TempSrc: Oral Oral Oral Oral  SpO2: 94% 95% 94% 92%  Weight:      Height:         Body mass index is 39.29 kg/m.  Physical Exam   General: Laying comfortably in bed; in no acute distress.  HENT: Normal oropharynx and mucosa. Normal external appearance of ears and nose.  Neck: Supple, no pain or tenderness  CV: No JVD. No peripheral edema.  Pulmonary:  Symmetric Chest rise. Normal respiratory effort.  Abdomen: Soft to touch, non-tender.  Ext: No cyanosis, edema, or deformity  Skin: No rash. Normal palpation of skin.   Musculoskeletal: Normal digits and nails by inspection. No clubbing.   Neurologic Examination  Mental status/Cognition: Alert, oriented to self, place, month and year, good attention.  Speech/language: Fluent, comprehension intact, object naming intact, repetition intact.  Cranial nerves:   CN II Pupils equal and reactive to light, no VF deficits    CN III,IV,VI EOM intact, no gaze preference or deviation, no nystagmus    CN V normal sensation in V1, V2, and V3 segments bilaterally    CN VII Mild R nasolabial flattening.   CN VIII normal hearing to speech    CN IX & X normal palatal elevation, no uvular deviation    CN XI 5/5 head turn and 5/5 shoulder shrug bilaterally    CN XII midline tongue protrusion    Motor:  Muscle bulk: normal, tone normal, pronator drift none tremor none Mvmt Root Nerve  Muscle Right Left Comments  SA C5/6 Ax Deltoid 4 5   EF C5/6 Mc Biceps 4+ 5   EE C6/7/8 Rad Triceps 4+ 5   WF C6/7 Med FCR     WE C7/8 PIN ECU     F Ab C8/T1  U ADM/FDI 4+ 5   HF L1/2/3 Fem Illopsoas 4- 5   KE L2/3/4 Fem Quad 4 5   DF L4/5 D Peron Tib Ant 2 5   PF S1/2 Tibial Grc/Sol 4 5    Sensation:  Light touch intact   Pin prick    Temperature    Vibration   Proprioception    Coordination/Complex Motor:  - Finger to Nose unable to do on the Right due to weakness. - Heel to shin unable to do on the R, no ataxia on the left. - Rapid alternating movement are normal - Gait: Deferred.   Labs   Basic Metabolic Panel:  Lab Results  Component Value Date   NA 140 12/01/2020   K 2.7 (LL) 12/01/2020   CO2 26 12/01/2020   GLUCOSE 142 (H) 12/01/2020   BUN 10 12/01/2020   CREATININE 0.68 12/01/2020   CALCIUM 8.7 (L) 12/01/2020   GFRNONAA >60 12/01/2020   GFRAA >60 06/15/2020   HbA1c:   Lab Results  Component Value Date   HGBA1C 6.9 (H) 10/03/2020   LDL:  Lab Results  Component Value Date   LDLCALC 71 12/01/2020   Urine Drug Screen:     Component Value Date/Time   LABOPIA NONE DETECTED 11/30/2020 1757   COCAINSCRNUR NONE DETECTED 11/30/2020 1757   LABBENZ NONE DETECTED 11/30/2020 1757   AMPHETMU NONE DETECTED 11/30/2020 1757   THCU NONE DETECTED 11/30/2020 1757   LABBARB NONE DETECTED 11/30/2020 1757    Alcohol Level     Component Value Date/Time   ETH <10 11/30/2020 1506   No results found for: PHENYTOIN, ZONISAMIDE, LAMOTRIGINE, LEVETIRACETA No results found for: PHENYTOIN, PHENOBARB, VALPROATE, CBMZ  Imaging and Diagnostic studies   MR BRAIN WO CONTRAST IMPRESSION: 1. No evidence of acute intracranial abnormality. 2. Similar advanced chronic microvascular ischemic disease and remote infarcts.  CT Angio head and neck: 1. No intracranial large vessel occlusion. 2. No significant interval change as compared to the CTA head/neck of 06/05/2020. 3. Intracranial atherosclerotic disease with multifocal stenoses, most notably as follows. 4. Severe atherosclerotic narrowing of the pre cavernous left internal carotid artery. 5. Moderate stenosis within a proximal M2 left MCA branch vessel. 6. Severe stenosis within the median artery of the corpus callosum somewhat distally. 7. Moderate atherosclerotic stenosis of the dominant V4 right vertebral artery. 8. Severe stenoses within the non dominant V4 left vertebral artery.  TTE Sept 2021: 1. Left ventricular ejection fraction, by estimation, is >55%. The left  ventricle has normal function. Left ventricular endocardial border not  optimally defined to evaluate regional wall motion. There is mild left  ventricular hypertrophy. Left  ventricular diastolic parameters are consistent with Grade I diastolic  dysfunction (impaired relaxation).  2. Right ventricular systolic function is normal. The right  ventricular  size is normal.  3. The mitral valve is grossly normal. No evidence of mitral valve  regurgitation.  4. The aortic valve was not well visualized. Aortic valve regurgitation  is not visualized. No aortic stenosis is present.   TEE Sept 2021: 1. Left ventricular ejection fraction, by estimation, is 55 %. The left  ventricle has normal function. The left ventricle has no regional wall  motion abnormalities.  2. Right ventricular systolic function is normal. The right ventricular  size is normal.  3. No left atrial/left atrial appendage thrombus was detected.  4. Negative saline contrast bubble study, unable to test with valsalva.   Lab Results  Component Value Date   HGBA1C 6.9 (  H) 10/03/2020   and  Lab Results  Component Value Date   LDLCALC 71 12/01/2020    Impression   Peter Ryan is a 70 y.o. male with PMH significant for right-handed white male with a history of diabetes, hypertension, obesity, and dyslipidemia, prior left paramedian pontine ischemic infarction and a subcentimeter deep white matter ischemic infarction in the right ACA territory in sept 2021 with residual R sided weakness and dysarthria who presents as a stroke code with worsening of his R sided weakness. Outside the tPA window at presentation. No LVO on CT Angio.  Exam worsened after Ativan and is worse this AM compared to last night. MRI is negative for a new stroke. Feels a little depressed today with the weakness.  I suspect that this is either recruedescence of prior stroke symptoms or a small acue stroke that is not visible on MRI.  Recommendations  - Aspirin and plavix daily x 21 days, followed by aspirin 81mg  daily alone. - Was not able to get Cardiac event monitor outpatient at discharge from last admission. Placed another order and explained to the family the importance of getting the event monitor. - Frequent Neuro checks per stroke unit protocol - Recent TTE and TEE with EF >  55. No PFO. - LDL of 71, continue atorvastatin 80mg  daily - HbA1c of 6.9 - Recommend DVT ppx - SBP goal - permissive hypertension first 24 h < 220/110. Hold home meds.  - Recommend Telemetry monitoring for arrythmia - Stroke education booklet - Recommend PT/OT/SLP consult - Follow up with stroke clinic.  ______________________________________________________________________   Thank you for the opportunity to take part in the care of this patient. If you have any further questions, please contact the neurology consultation attending.  Signed,  [place] Pager Number [phone]

## 2020-12-01 NOTE — Progress Notes (Signed)
PROGRESS NOTE    Peter Ryan  XBD:[id number] DOB: Sep 04, 1951 DOA: 11/30/2020 PCP: [name]., MD    Brief Narrative:  70 year old gentleman with history of left pontine stroke with residual right-sided hemiparesis and dysarthria since 9/21, diffuse large B-cell lymphoma status post chemotherapy, hypertension, type 2 diabetes, hyperlipidemia, BPH who presented to the hospital with worsening right-sided weakness from baseline.  Had several episodes of nausea without vomiting, felt dizzy with positional change while in the emergency room. At the emergency room he was found with obvious right-sided hemiparesis.  Potassium 3.  Magnesium 1.5.  CT scan and subsequent MRI were not showing any new stroke.  Admitted as TIA.   Assessment & Plan:   Principal Problem:   Right sided weakness Active Problems:   Hyperlipidemia associated with type 2 diabetes mellitus (HCC)   Controlled type 2 diabetes mellitus with hyperglycemia, without long-term current use of insulin ([place])   Hypertension associated with diabetes ([place])   History of stroke with residual deficit  TIA in a patient with history of left MCA stroke and right hemiparesis: Clinical findings, exacerbated weakness and right hemiparesis with history of right-sided weakness.  Gradually improving. CT head findings, no acute findings. MRI of the brain, negative for acute ischemia.  Chronic microvascular changes. CTA of the head and neck, atherosclerosis.  No large vessel occlusion. 2D echocardiogram, recent on previous hospitalization was normal. Antiplatelet therapy, on aspirin at home.  Neurology recommended aspirin 81 mg daily, Plavix 75 mg daily for 3 weeks then aspirin alone. LDL 71.  Already on high intensity statin.  Continue.  At goal. Hemoglobin A1c, 6.9.  At goal. DVT prophylaxis, Lovenox. Therapy recommendations, pending. Neurology will arrange for 30 days event monitoring as outpatient.  Hypokalemia/hypomagnesemia:  Severe and persistent.  Not on any potassium lowering medications. Replace aggressively and monitor levels.  We will keep on a schedule replacement.  Essential hypertension: Blood pressure is stable.  Resume amlodipine, clonidine and losartan.  Type 2 diabetes: Well controlled.  On Amaryl at home.  Resume on discharge.  Currently on sliding scale.  Diffuse large B-cell lymphoma/IgA MGUS: Followed by oncology outpatient.  Case discussed with neurology. Discussed with therapies, they want to see improvement of potassium levels before mobilizing the patient. Patient has significant deficits, may benefit with more aggressive rehab.  Will consult CIR.    DVT prophylaxis: Lovenox subcu   Code Status: Full code Family Communication: Wife at the bedside Disposition Plan: Status is: Observation  The patient will require care spanning > 2 midnights and should be moved to inpatient because: Unsafe d/c plan  Severe electrolyte abnormalities  Dispo: The patient is from: Home              Anticipated d/c is to: CIR              Patient currently is not medically stable to d/c.   Difficult to place patient No         Consultants:   Neurology  Procedures:   None  Antimicrobials:   None   Subjective: Patient seen and examined.  Wife at the bedside.  His upper hand is slightly improving in power but has extreme difficulty ambulating and feels weaker than at his baseline.  No other overnight events.  No recent history of diarrhea or diuretic use.  Objective: Vitals:   12/01/20 0235 12/01/20 0424 12/01/20 0624 12/01/20 0738  BP: (!) 156/62 (!) 159/72 (!) 167/78 134/75  Pulse: 71 75 88 84  Resp: 17 20 20 17   Temp: (!) 97.5 F (36.4 C) 97.7 F (36.5 C) 98 F (36.7 C) 98.5 F (36.9 C)  TempSrc: Oral Oral Oral Oral  SpO2: 94% 95% 94% 92%  Weight:      Height:        Intake/Output Summary (Last 24 hours) at 12/01/2020 1018 Last data filed at 12/01/2020 0800 Gross per 24 hour   Intake 250.03 ml  Output 985 ml  Net -734.97 ml   Filed Weights   11/30/20 1535 11/30/20 2032  Weight: 122.5 kg 124.2 kg    Examination:  General exam: Appears calm and comfortable  Looks comfortable.  Mildly anxious due to ongoing events. Respiratory system: Clear to auscultation. Respiratory effort normal.  No added sounds. Cardiovascular system: S1 & S2 heard, RRR. No JVD, murmurs, rubs, gallops or clicks. No pedal edema. Gastrointestinal system: Abdomen is nondistended, soft and nontender. No organomegaly or masses felt. Normal bowel sounds heard. Central nervous system: Alert and oriented.  No obvious cranial nerve deficits. Right upper extremity 4/5.  Right lower extremity 4/5 mostly on the proximal muscle group.  Sensory exam is normal. Skin: No rashes, lesions or ulcers Psychiatry: Judgement and insight appear normal. Mood & affect appropriate.     Data Reviewed: I have personally reviewed following labs and imaging studies  CBC: Recent Labs  Lab 11/30/20 1506 12/01/20 0419  WBC 10.6* 10.2  NEUTROABS 7.8*  --   HGB 14.7 14.9  HCT 43.4 44.1  MCV 80.7 80.0  PLT 236 144   Basic Metabolic Panel: Recent Labs  Lab 11/30/20 1506 12/01/20 0419  NA 137 140  K 3.0* 2.7*  CL 99 102  CO2 24 26  GLUCOSE 202* 142*  BUN 11 10  CREATININE 0.79 0.68  CALCIUM 8.5* 8.7*  MG 1.5* 2.1   GFR: Estimated Creatinine Clearance: 118.5 mL/min (by C-G formula based on SCr of 0.68 mg/dL). Liver Function Tests: Recent Labs  Lab 11/30/20 1506  AST 23  ALT 20  ALKPHOS 79  BILITOT 0.7  PROT 7.3  ALBUMIN 3.8   No results for input(s): LIPASE, AMYLASE in the last 168 hours. No results for input(s): AMMONIA in the last 168 hours. Coagulation Profile: Recent Labs  Lab 11/30/20 1506  INR 1.0   Cardiac Enzymes: No results for input(s): CKTOTAL, CKMB, CKMBINDEX, TROPONINI in the last 168 hours. BNP (last 3 results) No results for input(s): PROBNP in the last 8760  hours. HbA1C: No results for input(s): HGBA1C in the last 72 hours. CBG: Recent Labs  Lab 11/30/20 1500 11/30/20 2156 12/01/20 0834  GLUCAP 168* 136* 176*   Lipid Profile: Recent Labs    12/01/20 0419  CHOL 153  HDL 28*  LDLCALC 71  TRIG 270*  CHOLHDL 5.5   Thyroid Function Tests: No results for input(s): TSH, T4TOTAL, FREET4, T3FREE, THYROIDAB in the last 72 hours. Anemia Panel: No results for input(s): VITAMINB12, FOLATE, FERRITIN, TIBC, IRON, RETICCTPCT in the last 72 hours. Sepsis Labs: No results for input(s): PROCALCITON, LATICACIDVEN in the last 168 hours.  Recent Results (from the past 240 hour(s))  Resp Panel by RT-PCR (Flu A&B, Covid) Nasopharyngeal Swab     Status: None   Collection Time: 11/30/20  3:06 PM   Specimen: Nasopharyngeal Swab; Nasopharyngeal(NP) swabs in vial transport medium  Result Value Ref Range Status   SARS Coronavirus 2 by RT PCR NEGATIVE NEGATIVE Final    Comment: (NOTE) SARS-CoV-2 target nucleic acids are NOT DETECTED.  The SARS-CoV-2  RNA is generally detectable in upper respiratory specimens during the acute phase of infection. The lowest concentration of SARS-CoV-2 viral copies this assay can detect is 138 copies/mL. A negative result does not preclude SARS-Cov-2 infection and should not be used as the sole basis for treatment or other patient management decisions. A negative result may occur with  improper specimen collection/handling, submission of specimen other than nasopharyngeal swab, presence of viral mutation(s) within the areas targeted by this assay, and inadequate number of viral copies(<138 copies/mL). A negative result must be combined with clinical observations, patient history, and epidemiological information. The expected result is Negative.  Fact Sheet for Patients:  EntrepreneurPulse.com.au  Fact Sheet for Healthcare Providers:  IncredibleEmployment.be  This test is no t yet  approved or cleared by the [place] FDA and  has been authorized for detection and/or diagnosis of SARS-CoV-2 by FDA under an Emergency Use Authorization (EUA). This EUA will remain  in effect (meaning this test can be used) for the duration of the COVID-19 declaration under Section 564(b)(1) of the Act, 21 U.S.C.section 360bbb-3(b)(1), unless the authorization is terminated  or revoked sooner.       Influenza A by PCR NEGATIVE NEGATIVE Final   Influenza B by PCR NEGATIVE NEGATIVE Final    Comment: (NOTE) The Xpert Xpress SARS-CoV-2/FLU/RSV plus assay is intended as an aid in the diagnosis of influenza from Nasopharyngeal swab specimens and should not be used as a sole basis for treatment. Nasal washings and aspirates are unacceptable for Xpert Xpress SARS-CoV-2/FLU/RSV testing.  Fact Sheet for Patients: EntrepreneurPulse.com.au  Fact Sheet for Healthcare Providers: IncredibleEmployment.be  This test is not yet approved or cleared by the [place] FDA and has been authorized for detection and/or diagnosis of SARS-CoV-2 by FDA under an Emergency Use Authorization (EUA). This EUA will remain in effect (meaning this test can be used) for the duration of the COVID-19 declaration under Section 564(b)(1) of the Act, 21 U.S.C. section 360bbb-3(b)(1), unless the authorization is terminated or revoked.  Performed at [facility], [address] [place], [place] [address]          Radiology Studies: MR BRAIN WO CONTRAST  Result Date: 11/30/2020 CLINICAL DATA:  Neuro deficit, acute stroke suspected. EXAM: MRI HEAD WITHOUT CONTRAST TECHNIQUE: Multiplanar, multiecho pulse sequences of the brain and surrounding structures were obtained without intravenous contrast. COMPARISON:  Same day head CT.  MRI June 08, 2020. FINDINGS: Mildly motion limited study. Brain: No acute infarction, hemorrhage, hydrocephalus, extra-axial  collection or mass lesion. Advanced similar extensive T2/FLAIR hyperintensity within the white matter, compatible with chronic microvascular ischemic disease. Similar remote infarcts involving the right basal ganglia, bilateral thalami, and left corona radiata. Focus of susceptibility in the right thalamus, compatible with prior microhemorrhage. Vascular: Better evaluated on same day CTA. Major arterial flow voids are maintained at the skull base. Skull and upper cervical spine: Normal marrow signal. Sinuses/Orbits: Sinuses are largely clear. Other: No sizable mastoid effusion. IMPRESSION: 1. No evidence of acute intracranial abnormality. 2. Similar advanced chronic microvascular ischemic disease and remote infarcts. Electronically Signed   By: Margaretha Sheffield MD   On: 11/30/2020 17:20   CT HEAD CODE STROKE WO CONTRAST  Result Date: 11/30/2020 CLINICAL DATA:  Code stroke.  Acute neuro deficit.  Rule out stroke. EXAM: CT HEAD WITHOUT CONTRAST TECHNIQUE: Contiguous axial images were obtained from the base of the skull through the vertex without intravenous contrast. COMPARISON:  CT head 06/05/2020.  MRI 06/08/2020. FINDINGS: Brain: Generalized atrophy.  Negative for hydrocephalus. Moderate to advanced white matter hypodensity diffusely and bilaterally appears chronic. Chronic infarct right basal ganglia. Chronic infarct midline pons, best seen on MRI. Negative for acute infarct, hemorrhage, mass. Vascular: Negative for hyperdense vessel. Atherosclerotic calcification in the carotid and vertebral arteries. Skull: Negative Sinuses/Orbits: Paranasal sinuses clear. No orbital lesion. Right cataract extraction. Other: None ASPECTS ([place] Stroke Program Early CT Score) - Ganglionic level infarction (caudate, lentiform nuclei, internal capsule, insula, M1-M3 cortex): 7 - Supraganglionic infarction (M4-M6 cortex): 3 Total score (0-10 with 10 being normal): 10 IMPRESSION: 1. No acute abnormality 2. ASPECTS is 10 3.  Moderate to extensive chronic microvascular ischemic change in the white matter. 4. These results were called by telephone at the time of interpretation on 11/30/2020 at 3:25 pm to provider [facility] , who verbally acknowledged these results. Electronically Signed   By: Franchot Gallo M.D.   On: 11/30/2020 15:26   CT ANGIO HEAD CODE STROKE  Addendum Date: 11/30/2020   ADDENDUM REPORT: 11/30/2020 16:06 ADDENDUM: These results were called by telephone at the time of interpretation on 11/30/2020 at 4:00 pm to provider [facility] , who verbally acknowledged these results. Electronically Signed   By: Kellie Simmering DO   On: 11/30/2020 16:06   Result Date: 11/30/2020 CLINICAL DATA:  Stroke/TIA, assess intracranial arteries. Stroke/TIA, assess extracranial arteries. EXAM: CT ANGIOGRAPHY HEAD AND NECK TECHNIQUE: Multidetector CT imaging of the head and neck was performed using the standard protocol during bolus administration of intravenous contrast. Multiplanar CT image reconstructions and MIPs were obtained to evaluate the vascular anatomy. Carotid stenosis measurements (when applicable) are obtained utilizing NASCET criteria, using the distal internal carotid diameter as the denominator. CONTRAST:  41mL OMNIPAQUE IOHEXOL 350 MG/ML SOLN COMPARISON:  Noncontrast head CT performed earlier today 11/30/2020. Brain MRI 06/08/2020. CT angiogram head/neck 06/06/2019 FINDINGS: CTA NECK FINDINGS Aortic arch: Standard aortic branching. No hemodynamically significant innominate or proximal subclavian artery stenosis. Right carotid system: CCA and ICA patent within the neck without significant stenosis (50% or greater). Mild soft and calcified plaque within the carotid bifurcation and proximal ICA. Left carotid system: CCA and ICA patent within the neck without significant stenosis (50% or greater). Mild soft and calcified plaque within the carotid bifurcation and proximal ICA. Vertebral arteries: The right vertebral artery is  significantly dominant. Redemonstrated mild/moderate atherosclerotic narrowing of the proximal right V1 segment. Nonstenotic calcified plaque within the distal right V3 segment. The left vertebral artery is developmentally diminutive, but patent within the neck. Nonstenotic calcified plaque at the origin of this vessel. Skeleton: Cervical spondylosis. No acute bony abnormality or aggressive osseous lesion. Other neck: No neck mass or cervical lymphadenopathy. Subcentimeter thyroid nodules not meeting consensus criteria for ultrasound follow-up. Upper chest: No consolidation within the imaged lung apices. Review of the MIP images confirms the above findings CTA HEAD FINDINGS Anterior circulation: The intracranial right internal carotid artery is patent. Mild calcified plaque within this vessel with no more than mild stenosis. The intracranial left internal carotid artery is patent. Soft plaque results in severe stenosis of the pre cavernous left ICA (series 8, image 110) (series 10, image 139). In retrospect, this stenosis was present on the prior CTA of 06/05/2020. Nonstenotic calcified plaque more distally within the distal cavernous and paraclinoid left ICA. The M1 middle cerebral arteries are patent. No M2 proximal branch occlusion is identified. The anterior cerebral arteries are patent. There is atherosclerotic irregularity of the branch vessels. Most notably, there is redemonstration of a moderate stenosis  within a proximal M2 left MCA branch (series 9, image 20). Also redemonstrated, there is a median artery of the corpus callosum with a moderate/severe stenosis distally (series 11, image 25). No intracranial aneurysm is identified. Posterior circulation: The dominant intracranial right vertebral artery is patent. Redemonstrated moderate stenosis within the V4 right vertebral artery. The non dominant V4 left vertebral artery is patent with redemonstration of multifocal severe stenoses. The basilar artery is  patent. Fetal origin right posterior cerebral artery. The posterior cerebral arteries are patent. A right posterior communicating artery is present. The left posterior communicating artery is hypoplastic or absent. Venous sinuses: Within the limitations of contrast timing, no convincing thrombus. Anatomic variants: As described Review of the MIP images confirms the above findings IMPRESSION: CTA neck: 1. The common carotid and internal carotid arteries are patent within the neck without hemodynamically significant stenosis. Mild atherosclerotic disease within the carotid systems within the neck as described. 2. Vertebral arteries patent within the neck. Redemonstrated mild/moderate atherosclerotic narrowing of the proximal V1 segment of the dominant right vertebral artery. The left vertebral artery is developmentally diminutive. CTA head: 1. No intracranial large vessel occlusion. 2. No significant interval change as compared to the CTA head/neck of 06/05/2020. 3. Intracranial atherosclerotic disease with multifocal stenoses, most notably as follows. 4. Severe atherosclerotic narrowing of the pre cavernous left internal carotid artery. 5. Moderate stenosis within a proximal M2 left MCA branch vessel. 6. Severe stenosis within the median artery of the corpus callosum somewhat distally. 7. Moderate atherosclerotic stenosis of the dominant V4 right vertebral artery. 8. Severe stenoses within the non dominant V4 left vertebral artery. Electronically Signed: By: Kellie Simmering DO On: 11/30/2020 15:59   CT ANGIO NECK CODE STROKE  Addendum Date: 11/30/2020   ADDENDUM REPORT: 11/30/2020 16:06 ADDENDUM: These results were called by telephone at the time of interpretation on 11/30/2020 at 4:00 pm to provider [facility] , who verbally acknowledged these results. Electronically Signed   By: Kellie Simmering DO   On: 11/30/2020 16:06   Result Date: 11/30/2020 CLINICAL DATA:  Stroke/TIA, assess intracranial arteries.  Stroke/TIA, assess extracranial arteries. EXAM: CT ANGIOGRAPHY HEAD AND NECK TECHNIQUE: Multidetector CT imaging of the head and neck was performed using the standard protocol during bolus administration of intravenous contrast. Multiplanar CT image reconstructions and MIPs were obtained to evaluate the vascular anatomy. Carotid stenosis measurements (when applicable) are obtained utilizing NASCET criteria, using the distal internal carotid diameter as the denominator. CONTRAST:  26mL OMNIPAQUE IOHEXOL 350 MG/ML SOLN COMPARISON:  Noncontrast head CT performed earlier today 11/30/2020. Brain MRI 06/08/2020. CT angiogram head/neck 06/06/2019 FINDINGS: CTA NECK FINDINGS Aortic arch: Standard aortic branching. No hemodynamically significant innominate or proximal subclavian artery stenosis. Right carotid system: CCA and ICA patent within the neck without significant stenosis (50% or greater). Mild soft and calcified plaque within the carotid bifurcation and proximal ICA. Left carotid system: CCA and ICA patent within the neck without significant stenosis (50% or greater). Mild soft and calcified plaque within the carotid bifurcation and proximal ICA. Vertebral arteries: The right vertebral artery is significantly dominant. Redemonstrated mild/moderate atherosclerotic narrowing of the proximal right V1 segment. Nonstenotic calcified plaque within the distal right V3 segment. The left vertebral artery is developmentally diminutive, but patent within the neck. Nonstenotic calcified plaque at the origin of this vessel. Skeleton: Cervical spondylosis. No acute bony abnormality or aggressive osseous lesion. Other neck: No neck mass or cervical lymphadenopathy. Subcentimeter thyroid nodules not meeting consensus criteria for ultrasound follow-up. Upper  chest: No consolidation within the imaged lung apices. Review of the MIP images confirms the above findings CTA HEAD FINDINGS Anterior circulation: The intracranial right  internal carotid artery is patent. Mild calcified plaque within this vessel with no more than mild stenosis. The intracranial left internal carotid artery is patent. Soft plaque results in severe stenosis of the pre cavernous left ICA (series 8, image 110) (series 10, image 139). In retrospect, this stenosis was present on the prior CTA of 06/05/2020. Nonstenotic calcified plaque more distally within the distal cavernous and paraclinoid left ICA. The M1 middle cerebral arteries are patent. No M2 proximal branch occlusion is identified. The anterior cerebral arteries are patent. There is atherosclerotic irregularity of the branch vessels. Most notably, there is redemonstration of a moderate stenosis within a proximal M2 left MCA branch (series 9, image 20). Also redemonstrated, there is a median artery of the corpus callosum with a moderate/severe stenosis distally (series 11, image 25). No intracranial aneurysm is identified. Posterior circulation: The dominant intracranial right vertebral artery is patent. Redemonstrated moderate stenosis within the V4 right vertebral artery. The non dominant V4 left vertebral artery is patent with redemonstration of multifocal severe stenoses. The basilar artery is patent. Fetal origin right posterior cerebral artery. The posterior cerebral arteries are patent. A right posterior communicating artery is present. The left posterior communicating artery is hypoplastic or absent. Venous sinuses: Within the limitations of contrast timing, no convincing thrombus. Anatomic variants: As described Review of the MIP images confirms the above findings IMPRESSION: CTA neck: 1. The common carotid and internal carotid arteries are patent within the neck without hemodynamically significant stenosis. Mild atherosclerotic disease within the carotid systems within the neck as described. 2. Vertebral arteries patent within the neck. Redemonstrated mild/moderate atherosclerotic narrowing of the  proximal V1 segment of the dominant right vertebral artery. The left vertebral artery is developmentally diminutive. CTA head: 1. No intracranial large vessel occlusion. 2. No significant interval change as compared to the CTA head/neck of 06/05/2020. 3. Intracranial atherosclerotic disease with multifocal stenoses, most notably as follows. 4. Severe atherosclerotic narrowing of the pre cavernous left internal carotid artery. 5. Moderate stenosis within a proximal M2 left MCA branch vessel. 6. Severe stenosis within the median artery of the corpus callosum somewhat distally. 7. Moderate atherosclerotic stenosis of the dominant V4 right vertebral artery. 8. Severe stenoses within the non dominant V4 left vertebral artery. Electronically Signed: By: Kellie Simmering DO On: 11/30/2020 15:59        Scheduled Meds: . allopurinol  300 mg Oral Daily  . amLODipine  10 mg Oral Daily  . aspirin  325 mg Oral Daily  . atorvastatin  80 mg Oral Daily  . cloNIDine  0.1 mg Oral BID  . clopidogrel  75 mg Oral Daily  . enoxaparin (LOVENOX) injection  0.5 mg/kg Subcutaneous Q24H  . finasteride  5 mg Oral Daily  . insulin aspart  0-9 Units Subcutaneous TID WC  . losartan  100 mg Oral Daily  . magnesium oxide  800 mg Oral Daily  . potassium chloride  40 mEq Oral Daily   Continuous Infusions: . potassium chloride 10 mEq (12/01/20 0921)     LOS: 0 days    Time spent: 32 minutes    Barb Merino, MD [facility] Pager [phone]

## 2020-12-01 NOTE — Care Management Obs Status (Signed)
MEDICARE OBSERVATION STATUS NOTIFICATION   Patient Details  Name: Tevin Shillingford MRN: [id number] Date of Birth: 03-19-1951   Medicare Observation Status Notification Given:  Yes    Shelbie Hutching, RN 12/01/2020, 9:53 AM

## 2020-12-01 NOTE — TOC Initial Note (Signed)
Transition of Care [facility]) - Initial/Assessment Note    Patient Details  Name: Peter Ryan MRN: [id number] Date of Birth: Aug 07, 1951  Transition of Care [facility]) CM/SW Contact:    Shelbie Hutching, RN Phone Number: 12/01/2020, 10:03 AM  Clinical Narrative:                 Patient placed under observation for right sided weakness.  Patient had a stroke in September of this last year and went to CIR.  RNCM was able to meet with patient and his wife at the bedside this morning.  OT was in the room working with patient and they are recommending CIR again.  Stoke workup is negative.  Patient's wife cannot take care of the patient at home at this level of functioning.  His baseline has been walking short distances with his walker, he can get up with his lift chair to his walker and transition to the wheelchair.  Wife helps with getting patient to the bathroom and they do sponge baths but he does not get in the shower.   MD has put in referral to CIR, if CIR is not able to offer a bed RNCM will work on SNF placement.   Expected Discharge Plan: [place] Barriers to Discharge: Continued Medical Work up   Patient Goals and CMS Choice Patient states their goals for this hospitalization and ongoing recovery are:: Patient wants to go home but understands that he may need rehab CMS Medicare.gov Compare Post Acute Care list provided to:: Patient Choice offered to / list presented to : [facility]  Expected Discharge Plan and Services Expected Discharge Plan: [place]   Discharge Planning Services: CM Consult Post Acute Care Choice: [place] Living arrangements for the past 2 months: Single Family Home                 DME Arranged: N/A DME Agency: NA       HH Arranged: NA HH Agency: NA        Prior Living Arrangements/Services Living arrangements for the past 2 months: Single Family Home Lives with:: Spouse Patient language and need for  interpreter reviewed:: Yes Do you feel safe going back to the place where you live?: Yes      Need for Family Participation in Patient Care: Yes (Comment) (persistant weakness) Care giver support system in place?: Yes (comment) (wife) Current home services: DME (walker, wheelchair, lift chair, 3 in 1) Criminal Activity/Legal Involvement Pertinent to Current Situation/Hospitalization: No - Comment as needed  Activities of Daily Living Home Assistive Devices/Equipment: Gilford Rile (specify type) ADL Screening (condition at time of admission) Patient's cognitive ability adequate to safely complete daily activities?: Yes Is the patient deaf or have difficulty hearing?: No Does the patient have difficulty seeing, even when wearing glasses/contacts?: No Does the patient have difficulty concentrating, remembering, or making decisions?: No Patient able to express need for assistance with ADLs?: Yes Does the patient have difficulty dressing or bathing?: Yes Independently performs ADLs?: No Communication: Independent Dressing (OT): Needs assistance Is this a change from baseline?: Pre-admission baseline Grooming: Needs assistance Is this a change from baseline?: Pre-admission baseline Feeding: Needs assistance Is this a change from baseline?: Pre-admission baseline Bathing: Needs assistance Is this a change from baseline?: Pre-admission baseline Toileting: Needs assistance Is this a change from baseline?: Pre-admission baseline In/Out Bed: Needs assistance Is this a change from baseline?: Pre-admission baseline Walks in Home: Needs assistance Is this a change from baseline?: Pre-admission baseline  Does the patient have difficulty walking or climbing stairs?: No Weakness of Legs: Both Weakness of Arms/Hands: Right  Permission Sought/Granted Permission sought to share information with : Case Manager,Facility Contact Representative,Family Supports Permission granted to share information with :  Yes, Verbal Permission Granted  Share Information with NAME: [name]  Permission granted to share info w AGENCY: SNF's and CIR  Permission granted to share info w Relationship: wife     Emotional Assessment Appearance:: Appears stated age Attitude/Demeanor/Rapport: Engaged Affect (typically observed): Accepting Orientation: : Oriented to Self,Oriented to Place,Oriented to  Time,Oriented to Situation Alcohol / Substance Use: Not Applicable Psych Involvement: No (comment)  Admission diagnosis:  Right sided weakness [R53.1] Patient Active Problem List   Diagnosis Date Noted  . Right sided weakness 11/30/2020  . Hypertension associated with diabetes ([place]) 11/30/2020  . History of stroke with residual deficit 11/30/2020  . Left pontine cerebrovascular accident ([place]) 06/14/2020  . Facial droop   . Rectal bleeding   . Morbid obesity ([place])   . Right hemiparesis ([place])   . Controlled type 2 diabetes mellitus with hyperglycemia, without long-term current use of insulin ([place])   . Acute CVA (cerebrovascular accident) ([place]) 06/05/2020  . Obesity, Class III, BMI 40-49.9 (morbid obesity) ([place]) 06/05/2020  . Secondary malignant neoplasm of retroperitoneum and peritoneum ([place]) 05/02/2020  . Atherosclerosis of aorta ([place]) 05/02/2020  . Goals of care, counseling/discussion 04/08/2017  . 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 ([place]) 04/08/2017  . Ascites 02/08/2017  . Liver cirrhosis ([place]) 02/08/2017  . SBP (spontaneous bacterial peritonitis) ([place]) 02/07/2017  . 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  . DOE (dyspnea on exertion) 11/15/2016  . Obesity, morbid (more than 100 lbs over ideal weight or BMI > 40) ([place]) 11/15/2016  . Elevated PSA 05/30/2015  . BPH with obstruction/lower urinary tract symptoms 05/30/2015  . Type 2 diabetes mellitus ([place]) 05/18/2014  . Arthritis 01/03/2014  . Hyperlipidemia associated with type 2 diabetes mellitus ([place])  01/03/2014  . Benign essential hypertension 01/03/2014  . Gout 01/03/2014  . Psoriasis 01/03/2014  . Reflux 01/03/2014   PCP:  [name]., MD Pharmacy:   [place], [place] [address] [place] [address] Phone: [phone] Fax: [phone]     Social Determinants of Health (SDOH) Interventions    Readmission Risk Interventions No flowsheet data found.

## 2020-12-02 DIAGNOSIS — R531 Weakness: Secondary | ICD-10-CM | POA: Diagnosis not present

## 2020-12-02 LAB — BASIC METABOLIC PANEL
Anion gap: 10 (ref 5–15)
BUN: 14 mg/dL (ref 8–23)
CO2: 27 mmol/L (ref 22–32)
Calcium: 8.9 mg/dL (ref 8.9–10.3)
Chloride: 104 mmol/L (ref 98–111)
Creatinine, Ser: 0.86 mg/dL (ref 0.61–1.24)
GFR, Estimated: >60 mL/min (ref >60)
Glucose, Bld: 168 mg/dL — ABNORMAL HIGH (ref 70–99)
Potassium: 3 mmol/L — ABNORMAL LOW (ref 3.5–5.1)
Sodium: 141 mmol/L (ref 135–145)

## 2020-12-02 LAB — CBC WITH DIFFERENTIAL/PLATELET
Abs Immature Granulocytes: 0.02 10*3/uL (ref 0.00–0.07)
Basophils Absolute: 0.1 10*3/uL (ref 0.0–0.1)
Basophils Relative: 1 %
Eosinophils Absolute: 0.2 10*3/uL (ref 0.0–0.5)
Eosinophils Relative: 2 %
HCT: 42.2 % (ref 39.0–52.0)
Hemoglobin: 14 g/dL (ref 13.0–17.0)
Immature Granulocytes: 0 %
Lymphocytes Relative: 25 %
Lymphs Abs: 2.2 10*3/uL (ref 0.7–4.0)
MCH: 27.6 pg (ref 26.0–34.0)
MCHC: 33.2 g/dL (ref 30.0–36.0)
MCV: 83.1 fL (ref 80.0–100.0)
Monocytes Absolute: 0.9 10*3/uL (ref 0.1–1.0)
Monocytes Relative: 11 %
Neutro Abs: 5.5 10*3/uL (ref 1.7–7.7)
Neutrophils Relative %: 61 %
Platelets: 215 10*3/uL (ref 150–400)
RBC: 5.08 MIL/uL (ref 4.22–5.81)
RDW: 15.3 % (ref 11.5–15.5)
WBC: 9 10*3/uL (ref 4.0–10.5)
nRBC: 0 % (ref 0.0–0.2)

## 2020-12-02 LAB — GLUCOSE, CAPILLARY
Glucose-Capillary: 171 mg/dL — ABNORMAL HIGH (ref 70–99)
Glucose-Capillary: 152 mg/dL — ABNORMAL HIGH (ref 70–99)
Glucose-Capillary: 167 mg/dL — ABNORMAL HIGH (ref 70–99)
Glucose-Capillary: 149 mg/dL — ABNORMAL HIGH (ref 70–99)

## 2020-12-02 LAB — PHOSPHORUS: Phosphorus: 3.7 mg/dL (ref 2.5–4.6)

## 2020-12-02 LAB — MAGNESIUM: Magnesium: 2 mg/dL (ref 1.7–2.4)

## 2020-12-02 MED ORDER — POTASSIUM CHLORIDE 20 MEQ PO PACK
40.0000 meq | PACK | Freq: Two times a day (BID) | ORAL | Status: DC
  Administered 2020-12-02 – 2020-12-04 (×5): 40 meq via ORAL
  Filled 2020-12-02 (×5): qty 2

## 2020-12-02 NOTE — Progress Notes (Addendum)
Physical Therapy Treatment Patient Details Name: Peter Ryan MRN: [id number] DOB: 1950/11/02 Today's Date: 12/02/2020    History of Present Illness 70 year old gentleman with history of left pontine stroke with residual right-sided hemiparesis and dysarthria since 9/21, diffuse large B-cell lymphoma status post chemotherapy, hypertension, type 2 diabetes, hyperlipidemia, BPH who presented to the hospital with worsening right-sided weakness from baseline.  Had several episodes of nausea without vomiting, felt dizzy with positional change while in the emergency room.  At the emergency room he was found with obvious right-sided hemiparesis.  Potassium 3.  Magnesium 1.5.  CT scan and subsequent MRI were not showing any new stroke.  Admitted as TIA.    PT Comments    MD cleared pt for participation in setting of slightly low K+ (3.0). Pt seen for PT tx with co-tx with OT for pt & therapist's safety. Pt demonstrates improved sitting balance with less cuing for midline orientation on this date & is able to progress to squat pivot transfers with mod assist +2 to L. After using BSC pt is able to complete STS and progress to ambulating ~3 ft with max assist +2 with chair follow for safety. Pt requires significant assistance for gait & HHA was utilized 2/2 pt's decreased ability to grip RW (may benefit from hand orthosis). Pt is making slow functional gains & will continue to benefit from intensive therapy services to maximize independence with mobility.   Follow Up Recommendations  CIR     Equipment Recommendations   (TBD in next venue)    Recommendations for Other Services Rehab consult     Precautions / Restrictions Precautions Precautions: Fall Precaution Comments: R hemi Restrictions Weight Bearing Restrictions: No    Mobility  Bed Mobility Overal bed mobility: Needs Assistance Bed Mobility: Supine to Sit     Supine to sit: Mod assist;HOB elevated;+2 for physical assistance      General bed mobility comments: max cuing/encouragement to move BLE to EOB, assistance to upright trunk    Transfers Overall transfer level: Needs assistance Equipment used: Rolling walker (2 wheeled) Transfers: Public house manager;Sit to/from Stand Sit to Stand: Max assist;+2 safety/equipment  Squat pivot transfers: Mod assist;+2 physical assistance     General transfer comment: Pt is able to complete squat pivot bed>BSC with mod assist +2 with cuing for technique, STS from [facility] with max assist +1 with assistance to place R hand on RW to allow OT to perform peri hygiene, and STS from recliner with max assist +2 with HHA  Ambulation/Gait Ambulation/Gait assistance:  (max assist +2 + chair follow for safety) Gait Distance (Feet): 3 Feet Assistive device: 2 person hand held assist Gait Pattern/deviations: Decreased step length - right;Decreased step length - left;Decreased stride length;Decreased dorsiflexion - right;Decreased weight shift to left Gait velocity: decreased   General Gait Details: Pt is able to advance RLE but requires MAX assist to maintain neutral alignment as RLE externally rotates to unsafe position. Assistance for weight shifting L<>R and blocking R knee with cuing for quad activation/knee extension to allow pt to weight bear through RLE to advance LLE.   Stairs             Wheelchair Mobility    Modified Rankin (Stroke Patients Only)       Balance Overall balance assessment: Needs assistance Sitting-balance support: Feet supported;Single extremity supported Sitting balance-Leahy Scale: Fair Sitting balance - Comments: improved static sitting balance this date, still with R lateral lean, but overall better able to sustain with UEs  and not external support. Postural control: Right lateral lean Standing balance support: Bilateral upper extremity supported;During functional activity Standing balance-Leahy Scale: Zero Standing balance comment: MAX A +2                             Cognition Arousal/Alertness: Awake/alert Behavior During Therapy: WFL for tasks assessed/performed Overall Cognitive Status: Within Functional Limits for tasks assessed                                       Exercises     General Comments General comments (skin integrity, edema, etc.): Education re: importance of physically trying sit>stand vs relying on lift chair at home, education on stroke & stroke recovery      Pertinent Vitals/Pain Pain Assessment: No/denies pain    Home Living                      Prior Function            PT Goals (current goals can now be found in the care plan section) Acute Rehab PT Goals Patient Stated Goal: to get better, at least back to how I was after rehab PT Goal Formulation: With patient/family Time For Goal Achievement: 12/15/20 Potential to Achieve Goals: Fair Progress towards PT goals: Progressing toward goals    Frequency    7X/week      PT Plan Current plan remains appropriate    Co-evaluation PT/OT/SLP Co-Evaluation/Treatment: Yes Reason for Co-Treatment: For patient/therapist safety;To address functional/ADL transfers PT goals addressed during session: Mobility/safety with mobility;Balance        AM-PAC PT "6 Clicks" Mobility   Outcome Measure  Help needed turning from your back to your side while in a flat bed without using bedrails?: Total Help needed moving from lying on your back to sitting on the side of a flat bed without using bedrails?: Total Help needed moving to and from a bed to a chair (including a wheelchair)?: Total Help needed standing up from a chair using your arms (e.g., wheelchair or bedside chair)?: Total Help needed to walk in hospital room?: Total Help needed climbing 3-5 steps with a railing? : Total 6 Click Score: 6    End of Session Equipment Utilized During Treatment: Gait belt Activity Tolerance: Patient tolerated treatment  well Patient left: in chair;with chair alarm set;with call bell/phone within reach;with family/visitor present Nurse Communication: Mobility status PT Visit Diagnosis: Muscle weakness (generalized) (M62.81);Other abnormalities of gait and mobility (R26.89);Difficulty in walking, not elsewhere classified (R26.2);Unsteadiness on feet (R26.81);Hemiplegia and hemiparesis Hemiplegia - Right/Left: Right Hemiplegia - dominant/non-dominant: Dominant Hemiplegia - caused by: Unspecified     Time: 1607-3710 PT Time Calculation (min) (ACUTE ONLY): 48 min  Charges:  $Neuromuscular Re-education: 23-37 mins                     Lavone Nian, PT, DPT 12/02/20, 1:43 PM    [name] 12/02/2020, 1:40 PM

## 2020-12-02 NOTE — Progress Notes (Addendum)
Physical Therapy Treatment Patient Details Name: Peter Ryan MRN: [id number] DOB: 05-30-1951 Today's Date: 12/02/2020    History of Present Illness 70 year old gentleman with history of left pontine stroke with residual right-sided hemiparesis and dysarthria since 9/21, diffuse large B-cell lymphoma status post chemotherapy, hypertension, type 2 diabetes, hyperlipidemia, BPH who presented to the hospital with worsening right-sided weakness from baseline.  Had several episodes of nausea without vomiting, felt dizzy with positional change while in the emergency room.  At the emergency room he was found with obvious right-sided hemiparesis.  Potassium 3.  Magnesium 1.5.  CT scan and subsequent MRI were not showing any new stroke.  Admitted as TIA.    PT Comments    Pt seen for additional PT session as pt requesting to return to bed & requesting PT assistance vs nursing. PT educates pt on safety with all staff during transfers but provides total assist for transfer set up. Pt requires MAX assist to complete squat pivot drop arm recliner>bed with MAX multimodal cuing for head/hips relationship and sequencing. PT has to reposition RLE frequently & provide blocking to prevent movement. pt requires MAX assist for sit>supine and cuing for rolling. Pt notably fatigued at end of session. Will continue to follow acutely to progress independence with bed mobility, transfers & gait as able.    Follow Up Recommendations  CIR     Equipment Recommendations   (TBD in next venue)    Recommendations for Other Services Rehab consult     Precautions / Restrictions Precautions Precautions: Fall Precaution Comments: R hemi Restrictions Weight Bearing Restrictions: No    Mobility  Bed Mobility Overal bed mobility: Needs Assistance Bed Mobility: Sit to Supine     Sit>supine: Max assist         Transfers Overall transfer level: Needs assistance Transfers: Squat Pivot Transfers Squat pivot  transfers: Max assist     General transfer comment: Max cuing for head/hips relationship but pt with difficulty as pt will start losing balance to R with poor control of R side of body (question pt's effort), cuing for hand placement, sequencing & technique.     Stairs             Wheelchair Mobility    Modified Rankin (Stroke Patients Only)       Balance Overall balance assessment: Needs assistance Sitting-balance support: Feet supported;Single extremity supported Sitting balance-Leahy Scale: Fair Sitting balance - Comments: improved static sitting balance this date, still with R lateral lean, but overall better able to sustain with UEs and not external support. Postural control: Right lateral lean                               Cognition Arousal/Alertness: Awake/alert Behavior During Therapy: WFL for tasks assessed/performed Overall Cognitive Status: Within Functional Limits for tasks assessed                                       Exercises     General Comments General comments (skin integrity, edema, etc.): Education re: importance of physically trying sit>stand vs relying on lift chair at home, education on stroke & stroke recovery      Pertinent Vitals/Pain Pain Assessment: No/denies pain    Home Living  Prior Function            PT Goals (current goals can now be found in the care plan section) Acute Rehab PT Goals Patient Stated Goal: to get better, at least back to how I was after rehab PT Goal Formulation: With patient/family Time For Goal Achievement: 12/15/20 Potential to Achieve Goals: Fair Progress towards PT goals: Progressing toward goals    Frequency    7X/week      PT Plan Current plan remains appropriate    Co-evaluation    AM-PAC PT "6 Clicks" Mobility   Outcome Measure  Help needed turning from your back to your side while in a flat bed without using bedrails?:  Total Help needed moving from lying on your back to sitting on the side of a flat bed without using bedrails?: Total Help needed moving to and from a bed to a chair (including a wheelchair)?: Total Help needed standing up from a chair using your arms (e.g., wheelchair or bedside chair)?: Total Help needed to walk in hospital room?: Total Help needed climbing 3-5 steps with a railing? : Total 6 Click Score: 6    End of Session Equipment Utilized During Treatment: Gait belt Activity Tolerance: Patient tolerated treatment well;Patient limited by fatigue Patient left: in bed;with bed alarm set;with family/visitor present;with call bell/phone within reach Nurse Communication: Mobility status PT Visit Diagnosis: Muscle weakness (generalized) (M62.81);Other abnormalities of gait and mobility (R26.89);Difficulty in walking, not elsewhere classified (R26.2);Unsteadiness on feet (R26.81);Hemiplegia and hemiparesis Hemiplegia - Right/Left: Right Hemiplegia - dominant/non-dominant: Dominant Hemiplegia - caused by: Unspecified     Time: 8264-1583 PT Time Calculation (min) (ACUTE ONLY): 15 min  Charges:  $Neuromuscular Re-education: 8-22 mins                     Lavone Nian, PT, DPT 12/02/20, 1:52 PM    [name] 12/02/2020, 1:48 PM

## 2020-12-02 NOTE — Progress Notes (Signed)
Occupational Therapy Treatment Patient Details Name: Peter Ryan MRN: [id number] DOB: 14-May-1951 Today's Date: 12/02/2020    History of present illness 70 year old gentleman with history of left pontine stroke with residual right-sided hemiparesis and dysarthria since 9/21, diffuse large B-cell lymphoma status post chemotherapy, hypertension, type 2 diabetes, hyperlipidemia, BPH who presented to the hospital with worsening right-sided weakness from baseline.  Had several episodes of nausea without vomiting, felt dizzy with positional change while in the emergency room.  At the emergency room he was found with obvious right-sided hemiparesis.  Potassium 3.  Magnesium 1.5.  CT scan and subsequent MRI were not showing any new stroke.  Admitted as TIA.   OT comments  Pt seen for OT co-tx with PT this date to f/u re: safety with ADL mobility. Pt requires MOD A to come to sitting this date demonstrating progress and in addition, demo improved sitting tolerance and balance with F balance and tolerates ~6-7 mins. Pt requires MAX A +2 for commode transfer and toileting. In addition, pt transferred to chair with MAX A +2 with RN assisting to move [facility] out of the way. OT/PT provide B UE support with arm in arm to engage pt in taking ~3-4 steps with MAX A +2 for weight shifting, PT occasionally needing to manually advance R foot while OT assist with offsetting weight and blocking L knee. Pt tolerates well, but does endorse fatigue at end of session. Pt with progressing therapy tolerance and his supportive spouse was present throughout including for below-listed education. Will continue to follow. Continue to anticipate that CIR would be most beneficial f/u discharge disposition.    Follow Up Recommendations  CIR    Equipment Recommendations  Other (comment) (defer to next level of care)    Recommendations for Other Services Rehab consult    Precautions / Restrictions Precautions Precautions:  Fall Precaution Comments: R hemi Restrictions Weight Bearing Restrictions: No       Mobility Bed Mobility Overal bed mobility: Needs Assistance Bed Mobility: Supine to Sit;Sit to Supine     Supine to sit: Mod assist;HOB elevated     General bed mobility comments: pt in chair at conclusion of session    Transfers Overall transfer level: Needs assistance Equipment used: Rolling walker (2 wheeled) Transfers: Sit to/from [facility] Sit to Stand: Max assist;+2 physical assistance;From elevated surface;+2 safety/equipment Stand pivot transfers: Mod assist;Max assist;+2 physical assistance;+2 safety/equipment       General transfer comment: Pt does successfully complete one sit to stand this session with MAX A +1 (PT) and CGA from OT for extending hips. Remainder of transfers completed required 2p assist. Pt requires B UE support. Last sit to stand completed w/o RW as pt with limited R grip on handle d/t weakness.    Balance Overall balance assessment: Needs assistance Sitting-balance support: Feet supported;Single extremity supported Sitting balance-Leahy Scale: Fair Sitting balance - Comments: improved static sitting balance this date, still with R lateral lean, but overall better able to sustain with UEs and not external support. Postural control: Right lateral lean   Standing balance-Leahy Scale: Zero Standing balance comment: MAX A +2                           ADL either performed or assessed with clinical judgement   ADL Overall ADL's : Needs assistance/impaired  Toilet Transfer: Moderate assistance;Maximal assistance;+2 for physical assistance;+2 for safety/equipment;Stand-pivot;BSC Toilet Transfer Details (indicate cue type and reason): bari BSC adjacent to bed Toileting- Clothing Manipulation and Hygiene: Total assistance;Sit to/from stand Toileting - Clothing Manipulation Details (indicate cue type and  reason): PT assists pt in maintaining static stand with MOD/MAX A while OT assists pt with peri care in which, despite efforts, he is essentially unable to menaingfully contribute and requires total A.     Functional mobility during ADLs: Maximal assistance;Total assistance;+2 for physical assistance;+2 for safety/equipment (arm in arm bilaterally with MAX to TOTAL A from PT/OT team to shift weight to offload foot to take steps. Some manual assist to advance R foot from PT while OT assists in blocking pt's L knee.)       Vision Baseline Vision/History: Wears glasses Wears Glasses: At all times Patient Visual Report: No change from baseline     Perception     Praxis      Cognition Arousal/Alertness: Awake/alert Behavior During Therapy: WFL for tasks assessed/performed Overall Cognitive Status: Within Functional Limits for tasks assessed                                 General Comments: appropriate, but some drowsy/slurred speech. Better compared to yesterday.        Exercises Other Exercises Other Exercises: OT and PT co-treat to improve pt progression with ADL transfers and fxl mobility with pt primarily requiring at least MOD to MAX A +2 for balance, lift off, and safety. Pt does demos generally better improved tolerance this date. Education provided re: progressing strengthening, attempting to mobilize R UE/R LE for neuroplasticity, and generally progressing towards higher indep with mobility through not utilizing lift feature of chair at home. Pt and spouse present and with good understanding.   Shoulder Instructions       General Comments      Pertinent Vitals/ Pain       Pain Assessment: No/denies pain  Home Living                                          Prior Functioning/Environment              Frequency  Min 3X/week        Progress Toward Goals  OT Goals(current goals can now be found in the care plan section)   Progress towards OT goals: Progressing toward goals  Acute Rehab OT Goals Patient Stated Goal: to get better, at least back to how I was after rehab OT Goal Formulation: With patient/family Time For Goal Achievement: 12/15/20 Potential to Achieve Goals: Good  Plan Discharge plan remains appropriate    Co-evaluation                 AM-PAC OT "6 Clicks" Daily Activity     Outcome Measure   Help from another person eating meals?: A Little Help from another person taking care of personal grooming?: A Lot Help from another person toileting, which includes using toliet, bedpan, or urinal?: Total Help from another person bathing (including washing, rinsing, drying)?: A Lot Help from another person to put on and taking off regular upper body clothing?: A Lot Help from another person to put on and taking off regular lower body clothing?: Total 6 Click Score: 11  End of Session    OT Visit Diagnosis: Unsteadiness on feet (R26.81);Muscle weakness (generalized) (M62.81);Other symptoms and signs involving the nervous system (R29.898);Hemiplegia and hemiparesis Hemiplegia - Right/Left: Right Hemiplegia - dominant/non-dominant: Dominant Hemiplegia - caused by: Cerebral infarction;Unspecified   Activity Tolerance Patient tolerated treatment well;Patient limited by fatigue   Patient Left in bed;with call bell/phone within reach;with bed alarm set   Nurse Communication Mobility status;Other (comment)        Time: 0370-9643 OT Time Calculation (min): 48 min  Charges: OT General Charges $OT Visit: 1 Visit OT Treatments $Self Care/Home Management : 8-22 mins  Gerrianne Scale, MS, OTR/L ascom [phone] 12/02/20, 11:57 AM

## 2020-12-02 NOTE — Progress Notes (Signed)
PROGRESS NOTE    Peter Ryan  HYI:[id number] DOB: [birth year] DOA: 11/30/2020 PCP: [name]., MD    Brief Narrative:  70 year old gentleman with history of left pontine stroke with residual right-sided hemiparesis and dysarthria since 9/21, diffuse large B-cell lymphoma status post chemotherapy, hypertension, type 2 diabetes, hyperlipidemia, BPH who presented to the hospital with worsening right-sided weakness from baseline.  Had several episodes of nausea without vomiting, felt dizzy with positional change while in the emergency room. At the emergency room he was found with obvious right-sided hemiparesis.  Potassium 3.  Magnesium 1.5.  CT scan and subsequent MRI were not showing any new stroke.  Admitted as TIA.   Assessment & Plan:   Principal Problem:   Right sided weakness Active Problems:   Hyperlipidemia associated with type 2 diabetes mellitus (HCC)   Controlled type 2 diabetes mellitus with hyperglycemia, without long-term current use of insulin ([place])   Hypertension associated with diabetes ([place])   History of stroke with residual deficit   TIA (transient ischemic attack)  TIA in a patient with history of left MCA stroke and right hemiparesis: Clinical findings, exacerbated weakness and right hemiparesis with history of right-sided weakness.  Gradually improving. CT head findings, no acute findings. MRI of the brain, negative for acute ischemia.  Chronic microvascular changes. CTA of the head and neck, atherosclerosis.  No large vessel occlusion. 2D echocardiogram, recent on previous hospitalization was normal. Antiplatelet therapy, on aspirin at home.  Neurology recommended aspirin 81 mg daily, Plavix 75 mg daily for 3 weeks then aspirin alone. LDL 71.  Already on high intensity statin.  Continue.  At goal. Hemoglobin A1c, 6.9.  At goal. DVT prophylaxis, Lovenox. Therapy recommendations, pending. Neurology will arrange for 30 days event monitoring as  outpatient.  Hypokalemia/hypomagnesemia: Severe and persistent.  Not on any potassium lowering medications. Replace aggressively and monitor levels.  We will keep on a schedule replacement. Levels improved.  Essential hypertension: Blood pressure is stable.  Resume amlodipine, clonidine and losartan.  Type 2 diabetes: Well controlled.  On Amaryl at home.  Resume on discharge.  Currently on sliding scale.  Diffuse large B-cell lymphoma/IgA MGUS: Followed by oncology outpatient.  Medically stabilizing.  Transfer to CIR when bed available.   DVT prophylaxis: Lovenox subcu   Code Status: Full code Family Communication: Wife at the bedside Disposition Plan: Status is: Inpatient.   Dispo: The patient is from: Home              Anticipated d/c is to: CIR              Patient currently is medically stable.   Difficult to place patient No         Consultants:   Neurology  Procedures:   None  Antimicrobials:   None   Subjective: Patient seen and examined.  No overnight events.  Denies any nausea, vomiting, chest pain. Right hand is still weak, has some swelling today.  Objective: Vitals:   12/01/20 2345 12/02/20 0221 12/02/20 0349 12/02/20 0755  BP: (!) 162/80 (!) 164/81 (!) 154/83 (!) 159/79  Pulse: 78 77 77 82  Resp: 16 20 16 20   Temp: 98.1 F (36.7 C) 98 F (36.7 C) 98 F (36.7 C) 97.6 F (36.4 C)  TempSrc: Oral Oral    SpO2: 96% 97% 94% 98%  Weight:      Height:        Intake/Output Summary (Last 24 hours) at 12/02/2020 1030 Last data filed at  12/02/2020 1008 Gross per 24 hour  Intake 300 ml  Output 450 ml  Net -150 ml   Filed Weights   11/30/20 1535 11/30/20 2032  Weight: 122.5 kg 124.2 kg    Examination:  General exam: Appears calm and comfortable  Respiratory system: Clear to auscultation. Respiratory effort normal.  No added sounds. Cardiovascular system: S1 & S2 heard, RRR. No JVD, murmurs, rubs, gallops or clicks. No pedal  edema. Gastrointestinal system: Abdomen is nondistended, soft and nontender. No organomegaly or masses felt. Normal bowel sounds heard. Central nervous system: Alert and oriented.  No obvious cranial nerve deficits. Right upper extremity 4/5.  He has some pitting edema right dorsum of the hand.  Right lower extremity 4/5 mostly on the proximal muscle group.  Sensory exam is normal. Skin: No rashes, lesions or ulcers Psychiatry: Judgement and insight appear normal. Mood & affect appropriate.     Data Reviewed: I have personally reviewed following labs and imaging studies  CBC: Recent Labs  Lab 11/30/20 1506 12/01/20 0419 12/02/20 0533  WBC 10.6* 10.2 9.0  NEUTROABS 7.8*  --  5.5  HGB 14.7 14.9 14.0  HCT 43.4 44.1 42.2  MCV 80.7 80.0 83.1  PLT 236 236 836   Basic Metabolic Panel: Recent Labs  Lab 11/30/20 1506 12/01/20 0419 12/01/20 1111 12/02/20 0533  NA 137 140  --  141  K 3.0* 2.7* 3.4* 3.0*  CL 99 102  --  104  CO2 24 26  --  27  GLUCOSE 202* 142*  --  168*  BUN 11 10  --  14  CREATININE 0.79 0.68  --  0.86  CALCIUM 8.5* 8.7*  --  8.9  MG 1.5* 2.1  --  2.0  PHOS  --   --   --  3.7   GFR: Estimated Creatinine Clearance: 110.2 mL/min (by C-G formula based on SCr of 0.86 mg/dL). Liver Function Tests: Recent Labs  Lab 11/30/20 1506  AST 23  ALT 20  ALKPHOS 79  BILITOT 0.7  PROT 7.3  ALBUMIN 3.8   No results for input(s): LIPASE, AMYLASE in the last 168 hours. No results for input(s): AMMONIA in the last 168 hours. Coagulation Profile: Recent Labs  Lab 11/30/20 1506  INR 1.0   Cardiac Enzymes: No results for input(s): CKTOTAL, CKMB, CKMBINDEX, TROPONINI in the last 168 hours. BNP (last 3 results) No results for input(s): PROBNP in the last 8760 hours. HbA1C: Recent Labs    12/01/20 0419  HGBA1C 7.0*   CBG: Recent Labs  Lab 12/01/20 0834 12/01/20 1109 12/01/20 1636 12/01/20 2058 12/02/20 0840  GLUCAP 176* 167* 156* 152* 171*   Lipid  Profile: Recent Labs    12/01/20 0419  CHOL 153  HDL 28*  LDLCALC 71  TRIG 270*  CHOLHDL 5.5   Thyroid Function Tests: No results for input(s): TSH, T4TOTAL, FREET4, T3FREE, THYROIDAB in the last 72 hours. Anemia Panel: No results for input(s): VITAMINB12, FOLATE, FERRITIN, TIBC, IRON, RETICCTPCT in the last 72 hours. Sepsis Labs: No results for input(s): PROCALCITON, LATICACIDVEN in the last 168 hours.  Recent Results (from the past 240 hour(s))  Resp Panel by RT-PCR (Flu A&B, Covid) Nasopharyngeal Swab     Status: None   Collection Time: 11/30/20  3:06 PM   Specimen: Nasopharyngeal Swab; Nasopharyngeal(NP) swabs in vial transport medium  Result Value Ref Range Status   SARS Coronavirus 2 by RT PCR NEGATIVE NEGATIVE Final    Comment: (NOTE) SARS-CoV-2 target nucleic acids are  NOT DETECTED.  The SARS-CoV-2 RNA is generally detectable in upper respiratory specimens during the acute phase of infection. The lowest concentration of SARS-CoV-2 viral copies this assay can detect is 138 copies/mL. A negative result does not preclude SARS-Cov-2 infection and should not be used as the sole basis for treatment or other patient management decisions. A negative result may occur with  improper specimen collection/handling, submission of specimen other than nasopharyngeal swab, presence of viral mutation(s) within the areas targeted by this assay, and inadequate number of viral copies(<138 copies/mL). A negative result must be combined with clinical observations, patient history, and epidemiological information. The expected result is Negative.  Fact Sheet for Patients:  EntrepreneurPulse.com.au  Fact Sheet for Healthcare Providers:  IncredibleEmployment.be  This test is no t yet approved or cleared by the [place] FDA and  has been authorized for detection and/or diagnosis of SARS-CoV-2 by FDA under an Emergency Use Authorization (EUA). This  EUA will remain  in effect (meaning this test can be used) for the duration of the COVID-19 declaration under Section 564(b)(1) of the Act, 21 U.S.C.section 360bbb-3(b)(1), unless the authorization is terminated  or revoked sooner.       Influenza A by PCR NEGATIVE NEGATIVE Final   Influenza B by PCR NEGATIVE NEGATIVE Final    Comment: (NOTE) The Xpert Xpress SARS-CoV-2/FLU/RSV plus assay is intended as an aid in the diagnosis of influenza from Nasopharyngeal swab specimens and should not be used as a sole basis for treatment. Nasal washings and aspirates are unacceptable for Xpert Xpress SARS-CoV-2/FLU/RSV testing.  Fact Sheet for Patients: EntrepreneurPulse.com.au  Fact Sheet for Healthcare Providers: IncredibleEmployment.be  This test is not yet approved or cleared by the [place] FDA and has been authorized for detection and/or diagnosis of SARS-CoV-2 by FDA under an Emergency Use Authorization (EUA). This EUA will remain in effect (meaning this test can be used) for the duration of the COVID-19 declaration under Section 564(b)(1) of the Act, 21 U.S.C. section 360bbb-3(b)(1), unless the authorization is terminated or revoked.  Performed at [facility], [address], [place], [place] [address]          Radiology Studies: MR BRAIN WO CONTRAST  Result Date: 11/30/2020 CLINICAL DATA:  Neuro deficit, acute stroke suspected. EXAM: MRI HEAD WITHOUT CONTRAST TECHNIQUE: Multiplanar, multiecho pulse sequences of the brain and surrounding structures were obtained without intravenous contrast. COMPARISON:  Same day head CT.  MRI June 08, 2020. FINDINGS: Mildly motion limited study. Brain: No acute infarction, hemorrhage, hydrocephalus, extra-axial collection or mass lesion. Advanced similar extensive T2/FLAIR hyperintensity within the white matter, compatible with chronic microvascular ischemic disease. Similar remote infarcts  involving the right basal ganglia, bilateral thalami, and left corona radiata. Focus of susceptibility in the right thalamus, compatible with prior microhemorrhage. Vascular: Better evaluated on same day CTA. Major arterial flow voids are maintained at the skull base. Skull and upper cervical spine: Normal marrow signal. Sinuses/Orbits: Sinuses are largely clear. Other: No sizable mastoid effusion. IMPRESSION: 1. No evidence of acute intracranial abnormality. 2. Similar advanced chronic microvascular ischemic disease and remote infarcts. Electronically Signed   By: Margaretha Sheffield MD   On: 11/30/2020 17:20   CT HEAD CODE STROKE WO CONTRAST  Result Date: 11/30/2020 CLINICAL DATA:  Code stroke.  Acute neuro deficit.  Rule out stroke. EXAM: CT HEAD WITHOUT CONTRAST TECHNIQUE: Contiguous axial images were obtained from the base of the skull through the vertex without intravenous contrast. COMPARISON:  CT head 06/05/2020.  MRI  06/08/2020. FINDINGS: Brain: Generalized atrophy. Negative for hydrocephalus. Moderate to advanced white matter hypodensity diffusely and bilaterally appears chronic. Chronic infarct right basal ganglia. Chronic infarct midline pons, best seen on MRI. Negative for acute infarct, hemorrhage, mass. Vascular: Negative for hyperdense vessel. Atherosclerotic calcification in the carotid and vertebral arteries. Skull: Negative Sinuses/Orbits: Paranasal sinuses clear. No orbital lesion. Right cataract extraction. Other: None ASPECTS ([place] Stroke Program Early CT Score) - Ganglionic level infarction (caudate, lentiform nuclei, internal capsule, insula, M1-M3 cortex): 7 - Supraganglionic infarction (M4-M6 cortex): 3 Total score (0-10 with 10 being normal): 10 IMPRESSION: 1. No acute abnormality 2. ASPECTS is 10 3. Moderate to extensive chronic microvascular ischemic change in the white matter. 4. These results were called by telephone at the time of interpretation on 11/30/2020 at 3:25 pm to provider  [facility] , who verbally acknowledged these results. Electronically Signed   By: Franchot Gallo M.D.   On: 11/30/2020 15:26   CT ANGIO HEAD CODE STROKE  Addendum Date: 11/30/2020   ADDENDUM REPORT: 11/30/2020 16:06 ADDENDUM: These results were called by telephone at the time of interpretation on 11/30/2020 at 4:00 pm to provider [facility] , who verbally acknowledged these results. Electronically Signed   By: Kellie Simmering DO   On: 11/30/2020 16:06   Result Date: 11/30/2020 CLINICAL DATA:  Stroke/TIA, assess intracranial arteries. Stroke/TIA, assess extracranial arteries. EXAM: CT ANGIOGRAPHY HEAD AND NECK TECHNIQUE: Multidetector CT imaging of the head and neck was performed using the standard protocol during bolus administration of intravenous contrast. Multiplanar CT image reconstructions and MIPs were obtained to evaluate the vascular anatomy. Carotid stenosis measurements (when applicable) are obtained utilizing NASCET criteria, using the distal internal carotid diameter as the denominator. CONTRAST:  67mL OMNIPAQUE IOHEXOL 350 MG/ML SOLN COMPARISON:  Noncontrast head CT performed earlier today 11/30/2020. Brain MRI 06/08/2020. CT angiogram head/neck 06/06/2019 FINDINGS: CTA NECK FINDINGS Aortic arch: Standard aortic branching. No hemodynamically significant innominate or proximal subclavian artery stenosis. Right carotid system: CCA and ICA patent within the neck without significant stenosis (50% or greater). Mild soft and calcified plaque within the carotid bifurcation and proximal ICA. Left carotid system: CCA and ICA patent within the neck without significant stenosis (50% or greater). Mild soft and calcified plaque within the carotid bifurcation and proximal ICA. Vertebral arteries: The right vertebral artery is significantly dominant. Redemonstrated mild/moderate atherosclerotic narrowing of the proximal right V1 segment. Nonstenotic calcified plaque within the distal right V3 segment. The left  vertebral artery is developmentally diminutive, but patent within the neck. Nonstenotic calcified plaque at the origin of this vessel. Skeleton: Cervical spondylosis. No acute bony abnormality or aggressive osseous lesion. Other neck: No neck mass or cervical lymphadenopathy. Subcentimeter thyroid nodules not meeting consensus criteria for ultrasound follow-up. Upper chest: No consolidation within the imaged lung apices. Review of the MIP images confirms the above findings CTA HEAD FINDINGS Anterior circulation: The intracranial right internal carotid artery is patent. Mild calcified plaque within this vessel with no more than mild stenosis. The intracranial left internal carotid artery is patent. Soft plaque results in severe stenosis of the pre cavernous left ICA (series 8, image 110) (series 10, image 139). In retrospect, this stenosis was present on the prior CTA of 06/05/2020. Nonstenotic calcified plaque more distally within the distal cavernous and paraclinoid left ICA. The M1 middle cerebral arteries are patent. No M2 proximal branch occlusion is identified. The anterior cerebral arteries are patent. There is atherosclerotic irregularity of the branch vessels. Most notably, there is  redemonstration of a moderate stenosis within a proximal M2 left MCA branch (series 9, image 20). Also redemonstrated, there is a median artery of the corpus callosum with a moderate/severe stenosis distally (series 11, image 25). No intracranial aneurysm is identified. Posterior circulation: The dominant intracranial right vertebral artery is patent. Redemonstrated moderate stenosis within the V4 right vertebral artery. The non dominant V4 left vertebral artery is patent with redemonstration of multifocal severe stenoses. The basilar artery is patent. Fetal origin right posterior cerebral artery. The posterior cerebral arteries are patent. A right posterior communicating artery is present. The left posterior communicating artery  is hypoplastic or absent. Venous sinuses: Within the limitations of contrast timing, no convincing thrombus. Anatomic variants: As described Review of the MIP images confirms the above findings IMPRESSION: CTA neck: 1. The common carotid and internal carotid arteries are patent within the neck without hemodynamically significant stenosis. Mild atherosclerotic disease within the carotid systems within the neck as described. 2. Vertebral arteries patent within the neck. Redemonstrated mild/moderate atherosclerotic narrowing of the proximal V1 segment of the dominant right vertebral artery. The left vertebral artery is developmentally diminutive. CTA head: 1. No intracranial large vessel occlusion. 2. No significant interval change as compared to the CTA head/neck of 06/05/2020. 3. Intracranial atherosclerotic disease with multifocal stenoses, most notably as follows. 4. Severe atherosclerotic narrowing of the pre cavernous left internal carotid artery. 5. Moderate stenosis within a proximal M2 left MCA branch vessel. 6. Severe stenosis within the median artery of the corpus callosum somewhat distally. 7. Moderate atherosclerotic stenosis of the dominant V4 right vertebral artery. 8. Severe stenoses within the non dominant V4 left vertebral artery. Electronically Signed: By: Kellie Simmering DO On: 11/30/2020 15:59   CT ANGIO NECK CODE STROKE  Addendum Date: 11/30/2020   ADDENDUM REPORT: 11/30/2020 16:06 ADDENDUM: These results were called by telephone at the time of interpretation on 11/30/2020 at 4:00 pm to provider [facility] , who verbally acknowledged these results. Electronically Signed   By: Kellie Simmering DO   On: 11/30/2020 16:06   Result Date: 11/30/2020 CLINICAL DATA:  Stroke/TIA, assess intracranial arteries. Stroke/TIA, assess extracranial arteries. EXAM: CT ANGIOGRAPHY HEAD AND NECK TECHNIQUE: Multidetector CT imaging of the head and neck was performed using the standard protocol during bolus  administration of intravenous contrast. Multiplanar CT image reconstructions and MIPs were obtained to evaluate the vascular anatomy. Carotid stenosis measurements (when applicable) are obtained utilizing NASCET criteria, using the distal internal carotid diameter as the denominator. CONTRAST:  41mL OMNIPAQUE IOHEXOL 350 MG/ML SOLN COMPARISON:  Noncontrast head CT performed earlier today 11/30/2020. Brain MRI 06/08/2020. CT angiogram head/neck 06/06/2019 FINDINGS: CTA NECK FINDINGS Aortic arch: Standard aortic branching. No hemodynamically significant innominate or proximal subclavian artery stenosis. Right carotid system: CCA and ICA patent within the neck without significant stenosis (50% or greater). Mild soft and calcified plaque within the carotid bifurcation and proximal ICA. Left carotid system: CCA and ICA patent within the neck without significant stenosis (50% or greater). Mild soft and calcified plaque within the carotid bifurcation and proximal ICA. Vertebral arteries: The right vertebral artery is significantly dominant. Redemonstrated mild/moderate atherosclerotic narrowing of the proximal right V1 segment. Nonstenotic calcified plaque within the distal right V3 segment. The left vertebral artery is developmentally diminutive, but patent within the neck. Nonstenotic calcified plaque at the origin of this vessel. Skeleton: Cervical spondylosis. No acute bony abnormality or aggressive osseous lesion. Other neck: No neck mass or cervical lymphadenopathy. Subcentimeter thyroid nodules not meeting consensus  criteria for ultrasound follow-up. Upper chest: No consolidation within the imaged lung apices. Review of the MIP images confirms the above findings CTA HEAD FINDINGS Anterior circulation: The intracranial right internal carotid artery is patent. Mild calcified plaque within this vessel with no more than mild stenosis. The intracranial left internal carotid artery is patent. Soft plaque results in severe  stenosis of the pre cavernous left ICA (series 8, image 110) (series 10, image 139). In retrospect, this stenosis was present on the prior CTA of 06/05/2020. Nonstenotic calcified plaque more distally within the distal cavernous and paraclinoid left ICA. The M1 middle cerebral arteries are patent. No M2 proximal branch occlusion is identified. The anterior cerebral arteries are patent. There is atherosclerotic irregularity of the branch vessels. Most notably, there is redemonstration of a moderate stenosis within a proximal M2 left MCA branch (series 9, image 20). Also redemonstrated, there is a median artery of the corpus callosum with a moderate/severe stenosis distally (series 11, image 25). No intracranial aneurysm is identified. Posterior circulation: The dominant intracranial right vertebral artery is patent. Redemonstrated moderate stenosis within the V4 right vertebral artery. The non dominant V4 left vertebral artery is patent with redemonstration of multifocal severe stenoses. The basilar artery is patent. Fetal origin right posterior cerebral artery. The posterior cerebral arteries are patent. A right posterior communicating artery is present. The left posterior communicating artery is hypoplastic or absent. Venous sinuses: Within the limitations of contrast timing, no convincing thrombus. Anatomic variants: As described Review of the MIP images confirms the above findings IMPRESSION: CTA neck: 1. The common carotid and internal carotid arteries are patent within the neck without hemodynamically significant stenosis. Mild atherosclerotic disease within the carotid systems within the neck as described. 2. Vertebral arteries patent within the neck. Redemonstrated mild/moderate atherosclerotic narrowing of the proximal V1 segment of the dominant right vertebral artery. The left vertebral artery is developmentally diminutive. CTA head: 1. No intracranial large vessel occlusion. 2. No significant interval  change as compared to the CTA head/neck of 06/05/2020. 3. Intracranial atherosclerotic disease with multifocal stenoses, most notably as follows. 4. Severe atherosclerotic narrowing of the pre cavernous left internal carotid artery. 5. Moderate stenosis within a proximal M2 left MCA branch vessel. 6. Severe stenosis within the median artery of the corpus callosum somewhat distally. 7. Moderate atherosclerotic stenosis of the dominant V4 right vertebral artery. 8. Severe stenoses within the non dominant V4 left vertebral artery. Electronically Signed: By: Kellie Simmering DO On: 11/30/2020 15:59        Scheduled Meds: . allopurinol  300 mg Oral Daily  . amLODipine  10 mg Oral Daily  . aspirin  325 mg Oral Daily  . atorvastatin  80 mg Oral Daily  . cloNIDine  0.1 mg Oral BID  . clopidogrel  75 mg Oral Daily  . enoxaparin (LOVENOX) injection  60 mg Subcutaneous Q24H  . finasteride  5 mg Oral Daily  . insulin aspart  0-9 Units Subcutaneous TID WC  . losartan  100 mg Oral Daily  . magnesium oxide  800 mg Oral Daily  . potassium chloride  40 mEq Oral BID   Continuous Infusions:    LOS: 1 day    Time spent: 28 minutes    Barb Merino, MD [facility] Pager [phone]

## 2020-12-02 NOTE — Progress Notes (Signed)
Inpatient Rehab Admissions Coordinator:   CIR consult received. Pt. Demonstrates functional deficits, but, in the absence of new stroke, he does not demonstrate medical necessity for CIR admission at this time. I discussed this with Pt.'s wife and she stated understanding and is interested in SNF level rehab for this Pt.  CIR will sign off.   Clemens Catholic, [place], [place] Admissions Coordinator  [phone] ([place]) [phone] (office)

## 2020-12-03 DIAGNOSIS — R531 Weakness: Secondary | ICD-10-CM | POA: Diagnosis not present

## 2020-12-03 LAB — GLUCOSE, CAPILLARY
Glucose-Capillary: 159 mg/dL — ABNORMAL HIGH (ref 70–99)
Glucose-Capillary: 176 mg/dL — ABNORMAL HIGH (ref 70–99)
Glucose-Capillary: 150 mg/dL — ABNORMAL HIGH (ref 70–99)

## 2020-12-03 NOTE — Progress Notes (Signed)
Physical Therapy Treatment Patient Details Name: Peter Ryan MRN: [id number] DOB: 08/27/1951 Today's Date: 12/03/2020    History of Present Illness 70 year old gentleman with history of left pontine stroke with residual right-sided hemiparesis and dysarthria since 9/21, diffuse large B-cell lymphoma status post chemotherapy, hypertension, type 2 diabetes, hyperlipidemia, BPH who presented to the hospital with worsening right-sided weakness from baseline.  Had several episodes of nausea without vomiting, felt dizzy with positional change while in the emergency room.  At the emergency room he was found with obvious right-sided hemiparesis.  Potassium 3.  Magnesium 1.5.  CT scan and subsequent MRI were not showing any new stroke.  Admitted as TIA.    PT Comments    Pt seen for skilled PT treatment with pt still requiring mod assist +2 for bed mobility, mod/max assist +2 for stand pivot bed>BSC, and max assist +1 for STS from [facility].  Pt demonstrates improved sitting balance but still requires ongoing cuing for technique & assistance with transferring. PT educates pt to continue to perform tasks before reporting he can't do it. Pt would benefit from STR upon d/c to maximize independence with functional mobility & reduce fall risk prior to return home. Will continue to follow pt acutely to progress transfers & gait as able.    Follow Up Recommendations  SNF     Equipment Recommendations   (TBD in next venue)    Recommendations for Other Services       Precautions / Restrictions Precautions Precautions: Fall Precaution Comments: R hemi Restrictions Weight Bearing Restrictions: No    Mobility  Bed Mobility   Bed Mobility: Supine to Sit     Supine to sit: Mod assist;HOB elevated     General bed mobility comments: extra time & cuing to use bed rails, assistance to move RLE to EOB & upright trunk    Transfers Overall transfer level: Needs assistance   Transfers: Sit to/from  Stand;Stand Pivot Transfers Sit to Stand: Max assist Stand pivot transfers: Mod/max assist;+2 physical assistance          Ambulation/Gait                 Stairs             Wheelchair Mobility    Modified Rankin (Stroke Patients Only)       Balance Overall balance assessment: Needs assistance Sitting-balance support: Feet supported;Single extremity supported Sitting balance-Leahy Scale: Fair Sitting balance - Comments: improved static sitting balance with less R lateral lean                                    Cognition Arousal/Alertness: Awake/alert Behavior During Therapy: WFL for tasks assessed/performed Overall Cognitive Status: Within Functional Limits for tasks assessed                                        Exercises      General Comments General comments (skin integrity, edema, etc.): Pt requires encouragement throughout to attempt mobility tasks before stating he cannot do something or relying on someone to do it for him.      Pertinent Vitals/Pain Pain Assessment: No/denies pain    Home Living                      Prior Function  PT Goals (current goals can now be found in the care plan section) Acute Rehab PT Goals Patient Stated Goal: to get better, at least back to how I was after rehab PT Goal Formulation: With patient/family Time For Goal Achievement: 12/15/20 Potential to Achieve Goals: Fair Progress towards PT goals: Progressing toward goals    Frequency    Min 2X/week      PT Plan Discharge plan needs to be updated;Frequency needs to be updated    Co-evaluation              AM-PAC PT "6 Clicks" Mobility   Outcome Measure  Help needed turning from your back to your side while in a flat bed without using bedrails?: Total Help needed moving from lying on your back to sitting on the side of a flat bed without using bedrails?: Total Help needed moving to and  from a bed to a chair (including a wheelchair)?: Total Help needed standing up from a chair using your arms (e.g., wheelchair or bedside chair)?: Total Help needed to walk in hospital room?: Total Help needed climbing 3-5 steps with a railing? : Total 6 Click Score: 6    End of Session Equipment Utilized During Treatment: Gait belt Activity Tolerance: Patient tolerated treatment well Patient left: in chair;with call bell/phone within reach;with chair alarm set;with family/visitor present Nurse Communication: Mobility status PT Visit Diagnosis: Muscle weakness (generalized) (M62.81);Other abnormalities of gait and mobility (R26.89);Difficulty in walking, not elsewhere classified (R26.2);Unsteadiness on feet (R26.81);Hemiplegia and hemiparesis Hemiplegia - Right/Left: Right Hemiplegia - dominant/non-dominant: Dominant Hemiplegia - caused by: Unspecified     Time: 2992-4268 PT Time Calculation (min) (ACUTE ONLY): 30 min  Charges:  $Neuromuscular Re-education: 23-37 mins                     Lavone Nian, PT, DPT 12/03/20, 11:12 AM    [name] 12/03/2020, 11:10 AM

## 2020-12-03 NOTE — Progress Notes (Signed)
PROGRESS NOTE    Peter Ryan  SHF:[id number] DOB: 12/06/1950 DOA: 11/30/2020 PCP: [name]., MD    Brief Narrative:  70 year old gentleman with history of left pontine stroke with residual right-sided hemiparesis and dysarthria since 9/21, diffuse large B-cell lymphoma status post chemotherapy, hypertension, type 2 diabetes, hyperlipidemia, BPH who presented to the hospital with worsening right-sided weakness from baseline.  Had several episodes of nausea without vomiting, felt dizzy with positional change while in the emergency room. At the emergency room he was found with obvious right-sided hemiparesis.  Potassium 3.  Magnesium 1.5.  CT scan and subsequent MRI were not showing any new stroke.  Admitted as TIA.   Assessment & Plan:   Principal Problem:   Right sided weakness Active Problems:   Hyperlipidemia associated with type 2 diabetes mellitus (HCC)   Controlled type 2 diabetes mellitus with hyperglycemia, without long-term current use of insulin ([place])   Hypertension associated with diabetes ([place])   History of stroke with residual deficit   TIA (transient ischemic attack)  TIA in a patient with history of left MCA stroke and right hemiparesis: Clinical findings, exacerbated weakness and right hemiparesis with history of right-sided weakness.  Gradually improving. CT head findings, no acute findings. MRI of the brain, negative for acute ischemia.  Chronic microvascular changes. CTA of the head and neck, atherosclerosis.  No large vessel occlusion. 2D echocardiogram, recent on previous hospitalization was normal. Antiplatelet therapy, on aspirin at home.  Neurology recommended aspirin 81 mg daily, Plavix 75 mg daily for 3 weeks then aspirin alone. LDL 71.  Already on high intensity statin.  Continue.  At goal. Hemoglobin A1c, 6.9.  At goal. DVT prophylaxis, Lovenox. Therapy recommendations, acute inpatient rehab or SNF. Neurology will arrange for 30 days event  monitoring as outpatient.  Hypokalemia/hypomagnesemia: Severe and persistent.  Not on any potassium lowering medications. Replace aggressively and monitor levels.  We will keep on a schedule replacement. Levels improved. Recheck tomorrow morning.  Essential hypertension: Blood pressure is stable.  Resume amlodipine, clonidine and losartan.  Type 2 diabetes: Well controlled.  On Amaryl at home.  Resume on discharge.  Currently on sliding scale.  Diffuse large B-cell lymphoma/IgA MGUS: Followed by oncology outpatient.  Medically stable.  Transfer to SNF when bed available.   DVT prophylaxis: Lovenox subcu   Code Status: Full code Family Communication: Wife at the bedside Disposition Plan: Status is: Inpatient.   Dispo: The patient is from: Home              Anticipated d/c is to: CIR denied.  Refer to SNF.              Patient currently is medically stable.   Difficult to place patient No         Consultants:   Neurology  Procedures:   None  Antimicrobials:   None   Subjective: Patient seen and examined.  Wife at the bedside.  Need a lot of effort to go from bed to chair.  Objective: Vitals:   12/02/20 1924 12/02/20 2000 12/03/20 0024 12/03/20 0743  BP: (!) 162/81  (!) 166/83 140/84  Pulse: 92  84 79  Resp: 20  16 16   Temp: 97.7 F (36.5 C) 97.7 F (36.5 C) (!) 97.4 F (36.3 C) 98.7 F (37.1 C)  TempSrc: Oral   Oral  SpO2: 96% 95% 95% 95%  Weight:      Height:        Intake/Output Summary (Last 24  hours) at 12/03/2020 1041 Last data filed at 12/03/2020 0750 Gross per 24 hour  Intake 195 ml  Output 1425 ml  Net -1230 ml   Filed Weights   11/30/20 1535 11/30/20 2032  Weight: 122.5 kg 124.2 kg    Examination:  General: Looks comfortable.  Sitting in chair. Cardiovascular: S1-S2 normal.  Regular rate rhythm Respiratory: Bilateral clear Gastrointestinal: Soft and nontender.  Bowel sounds present Ext: No cyanosis or edema. Neuro: Cranial  nerves no deficit. Right upper extremity 3-4/5.  Right lower extremity 4 x 5.  Left is normal.    Data Reviewed: I have personally reviewed following labs and imaging studies  CBC: Recent Labs  Lab 11/30/20 1506 12/01/20 0419 12/02/20 0533  WBC 10.6* 10.2 9.0  NEUTROABS 7.8*  --  5.5  HGB 14.7 14.9 14.0  HCT 43.4 44.1 42.2  MCV 80.7 80.0 83.1  PLT 236 236 174   Basic Metabolic Panel: Recent Labs  Lab 11/30/20 1506 12/01/20 0419 12/01/20 1111 12/02/20 0533  NA 137 140  --  141  K 3.0* 2.7* 3.4* 3.0*  CL 99 102  --  104  CO2 24 26  --  27  GLUCOSE 202* 142*  --  168*  BUN 11 10  --  14  CREATININE 0.79 0.68  --  0.86  CALCIUM 8.5* 8.7*  --  8.9  MG 1.5* 2.1  --  2.0  PHOS  --   --   --  3.7   GFR: Estimated Creatinine Clearance: 110.2 mL/min (by C-G formula based on SCr of 0.86 mg/dL). Liver Function Tests: Recent Labs  Lab 11/30/20 1506  AST 23  ALT 20  ALKPHOS 79  BILITOT 0.7  PROT 7.3  ALBUMIN 3.8   No results for input(s): LIPASE, AMYLASE in the last 168 hours. No results for input(s): AMMONIA in the last 168 hours. Coagulation Profile: Recent Labs  Lab 11/30/20 1506  INR 1.0   Cardiac Enzymes: No results for input(s): CKTOTAL, CKMB, CKMBINDEX, TROPONINI in the last 168 hours. BNP (last 3 results) No results for input(s): PROBNP in the last 8760 hours. HbA1C: Recent Labs    12/01/20 0419  HGBA1C 7.0*   CBG: Recent Labs  Lab 12/02/20 0840 12/02/20 1208 12/02/20 1612 12/02/20 2156 12/03/20 0741  GLUCAP 171* 152* 167* 149* 159*   Lipid Profile: Recent Labs    12/01/20 0419  CHOL 153  HDL 28*  LDLCALC 71  TRIG 270*  CHOLHDL 5.5   Thyroid Function Tests: No results for input(s): TSH, T4TOTAL, FREET4, T3FREE, THYROIDAB in the last 72 hours. Anemia Panel: No results for input(s): VITAMINB12, FOLATE, FERRITIN, TIBC, IRON, RETICCTPCT in the last 72 hours. Sepsis Labs: No results for input(s): PROCALCITON, LATICACIDVEN in the last  168 hours.  Recent Results (from the past 240 hour(s))  Resp Panel by RT-PCR (Flu A&B, Covid) Nasopharyngeal Swab     Status: None   Collection Time: 11/30/20  3:06 PM   Specimen: Nasopharyngeal Swab; Nasopharyngeal(NP) swabs in vial transport medium  Result Value Ref Range Status   SARS Coronavirus 2 by RT PCR NEGATIVE NEGATIVE Final    Comment: (NOTE) SARS-CoV-2 target nucleic acids are NOT DETECTED.  The SARS-CoV-2 RNA is generally detectable in upper respiratory specimens during the acute phase of infection. The lowest concentration of SARS-CoV-2 viral copies this assay can detect is 138 copies/mL. A negative result does not preclude SARS-Cov-2 infection and should not be used as the sole basis for treatment or other patient  management decisions. A negative result may occur with  improper specimen collection/handling, submission of specimen other than nasopharyngeal swab, presence of viral mutation(s) within the areas targeted by this assay, and inadequate number of viral copies(<138 copies/mL). A negative result must be combined with clinical observations, patient history, and epidemiological information. The expected result is Negative.  Fact Sheet for Patients:  EntrepreneurPulse.com.au  Fact Sheet for Healthcare Providers:  IncredibleEmployment.be  This test is no t yet approved or cleared by the [place] FDA and  has been authorized for detection and/or diagnosis of SARS-CoV-2 by FDA under an Emergency Use Authorization (EUA). This EUA will remain  in effect (meaning this test can be used) for the duration of the COVID-19 declaration under Section 564(b)(1) of the Act, 21 U.S.C.section 360bbb-3(b)(1), unless the authorization is terminated  or revoked sooner.       Influenza A by PCR NEGATIVE NEGATIVE Final   Influenza B by PCR NEGATIVE NEGATIVE Final    Comment: (NOTE) The Xpert Xpress SARS-CoV-2/FLU/RSV plus assay is  intended as an aid in the diagnosis of influenza from Nasopharyngeal swab specimens and should not be used as a sole basis for treatment. Nasal washings and aspirates are unacceptable for Xpert Xpress SARS-CoV-2/FLU/RSV testing.  Fact Sheet for Patients: EntrepreneurPulse.com.au  Fact Sheet for Healthcare Providers: IncredibleEmployment.be  This test is not yet approved or cleared by the [place] FDA and has been authorized for detection and/or diagnosis of SARS-CoV-2 by FDA under an Emergency Use Authorization (EUA). This EUA will remain in effect (meaning this test can be used) for the duration of the COVID-19 declaration under Section 564(b)(1) of the Act, 21 U.S.C. section 360bbb-3(b)(1), unless the authorization is terminated or revoked.  Performed at [facility], [address] [place], [place] [address]          Radiology Studies: No results found.      Scheduled Meds: . allopurinol  300 mg Oral Daily  . amLODipine  10 mg Oral Daily  . aspirin  325 mg Oral Daily  . atorvastatin  80 mg Oral Daily  . cloNIDine  0.1 mg Oral BID  . clopidogrel  75 mg Oral Daily  . enoxaparin (LOVENOX) injection  60 mg Subcutaneous Q24H  . finasteride  5 mg Oral Daily  . insulin aspart  0-9 Units Subcutaneous TID WC  . losartan  100 mg Oral Daily  . magnesium oxide  800 mg Oral Daily  . potassium chloride  40 mEq Oral BID   Continuous Infusions:    LOS: 2 days    Time spent: 28 minutes    Barb Merino, MD [facility] Pager [phone]

## 2020-12-03 NOTE — Plan of Care (Signed)

## 2020-12-04 ENCOUNTER — Ambulatory Visit: Payer: Medicare Other | Admitting: Physical Therapy

## 2020-12-04 DIAGNOSIS — Z7401 Bed confinement status: Secondary | ICD-10-CM | POA: Diagnosis not present

## 2020-12-04 DIAGNOSIS — L209 Atopic dermatitis, unspecified: Secondary | ICD-10-CM | POA: Diagnosis not present

## 2020-12-04 DIAGNOSIS — G8191 Hemiplegia, unspecified affecting right dominant side: Secondary | ICD-10-CM | POA: Diagnosis not present

## 2020-12-04 DIAGNOSIS — G459 Transient cerebral ischemic attack, unspecified: Secondary | ICD-10-CM | POA: Diagnosis not present

## 2020-12-04 DIAGNOSIS — M2550 Pain in unspecified joint: Secondary | ICD-10-CM | POA: Diagnosis not present

## 2020-12-04 DIAGNOSIS — E119 Type 2 diabetes mellitus without complications: Secondary | ICD-10-CM | POA: Diagnosis not present

## 2020-12-04 DIAGNOSIS — K219 Gastro-esophageal reflux disease without esophagitis: Secondary | ICD-10-CM | POA: Diagnosis not present

## 2020-12-04 DIAGNOSIS — I69351 Hemiplegia and hemiparesis following cerebral infarction affecting right dominant side: Secondary | ICD-10-CM | POA: Diagnosis not present

## 2020-12-04 DIAGNOSIS — K7460 Unspecified cirrhosis of liver: Secondary | ICD-10-CM | POA: Diagnosis not present

## 2020-12-04 DIAGNOSIS — M6281 Muscle weakness (generalized): Secondary | ICD-10-CM | POA: Diagnosis not present

## 2020-12-04 DIAGNOSIS — R531 Weakness: Secondary | ICD-10-CM | POA: Diagnosis not present

## 2020-12-04 DIAGNOSIS — N138 Other obstructive and reflux uropathy: Secondary | ICD-10-CM | POA: Diagnosis not present

## 2020-12-04 DIAGNOSIS — Z6841 Body Mass Index (BMI) 40.0 and over, adult: Secondary | ICD-10-CM | POA: Diagnosis not present

## 2020-12-04 DIAGNOSIS — Z23 Encounter for immunization: Secondary | ICD-10-CM | POA: Diagnosis not present

## 2020-12-04 DIAGNOSIS — N400 Enlarged prostate without lower urinary tract symptoms: Secondary | ICD-10-CM | POA: Diagnosis not present

## 2020-12-04 DIAGNOSIS — M1000 Idiopathic gout, unspecified site: Secondary | ICD-10-CM | POA: Diagnosis not present

## 2020-12-04 DIAGNOSIS — Z8673 Personal history of transient ischemic attack (TIA), and cerebral infarction without residual deficits: Secondary | ICD-10-CM | POA: Diagnosis not present

## 2020-12-04 DIAGNOSIS — M109 Gout, unspecified: Secondary | ICD-10-CM | POA: Diagnosis not present

## 2020-12-04 DIAGNOSIS — R278 Other lack of coordination: Secondary | ICD-10-CM | POA: Diagnosis not present

## 2020-12-04 DIAGNOSIS — E785 Hyperlipidemia, unspecified: Secondary | ICD-10-CM | POA: Diagnosis not present

## 2020-12-04 DIAGNOSIS — E0810 Diabetes mellitus due to underlying condition with ketoacidosis without coma: Secondary | ICD-10-CM | POA: Diagnosis not present

## 2020-12-04 DIAGNOSIS — Z13220 Encounter for screening for lipoid disorders: Secondary | ICD-10-CM | POA: Diagnosis not present

## 2020-12-04 DIAGNOSIS — E1159 Type 2 diabetes mellitus with other circulatory complications: Secondary | ICD-10-CM | POA: Diagnosis not present

## 2020-12-04 DIAGNOSIS — R262 Difficulty in walking, not elsewhere classified: Secondary | ICD-10-CM | POA: Diagnosis not present

## 2020-12-04 DIAGNOSIS — Z13228 Encounter for screening for other metabolic disorders: Secondary | ICD-10-CM | POA: Diagnosis not present

## 2020-12-04 DIAGNOSIS — I639 Cerebral infarction, unspecified: Secondary | ICD-10-CM | POA: Diagnosis not present

## 2020-12-04 DIAGNOSIS — I10 Essential (primary) hypertension: Secondary | ICD-10-CM | POA: Diagnosis not present

## 2020-12-04 DIAGNOSIS — N401 Enlarged prostate with lower urinary tract symptoms: Secondary | ICD-10-CM | POA: Diagnosis not present

## 2020-12-04 DIAGNOSIS — B351 Tinea unguium: Secondary | ICD-10-CM | POA: Diagnosis not present

## 2020-12-04 DIAGNOSIS — Z741 Need for assistance with personal care: Secondary | ICD-10-CM | POA: Diagnosis not present

## 2020-12-04 LAB — GLUCOSE, CAPILLARY
Glucose-Capillary: 177 mg/dL — ABNORMAL HIGH (ref 70–99)
Glucose-Capillary: 171 mg/dL — ABNORMAL HIGH (ref 70–99)

## 2020-12-04 LAB — POTASSIUM: Potassium: 3.3 mmol/L — ABNORMAL LOW (ref 3.5–5.1)

## 2020-12-04 LAB — MAGNESIUM: Magnesium: 1.9 mg/dL (ref 1.7–2.4)

## 2020-12-04 MED ORDER — POLYETHYLENE GLYCOL 3350 17 G PO PACK: 17.0000 g | PACK | Freq: Every day | ORAL | Status: DC | PRN

## 2020-12-04 MED ORDER — CLOPIDOGREL BISULFATE 75 MG PO TABS: 75.0000 mg | ORAL_TABLET | Freq: Every day | ORAL | 0 refills | Status: AC

## 2020-12-04 MED ORDER — MAGNESIUM OXIDE 400 (241.3 MG) MG PO TABS: 800.0000 mg | ORAL_TABLET | Freq: Every day | ORAL | Status: DC

## 2020-12-04 MED ORDER — POTASSIUM CHLORIDE ER 20 MEQ PO TBCR: 20.0000 meq | EXTENDED_RELEASE_TABLET | Freq: Two times a day (BID) | ORAL | Status: DC

## 2020-12-04 NOTE — Progress Notes (Signed)
Pt a current does not demonstrate ability to straighten right hand independent. Lower Right arm placed on pillow with right finger tips placed on bedside rail to encourage pt to straighten fingers.  Pt request that left side rail also remain up.

## 2020-12-04 NOTE — TOC Transition Note (Signed)
Transition of Care [facility]) - CM/SW Discharge Note   Patient Details  Name: Peter Ryan MRN: [id number] Date of Birth: 09-07-1951  [facility] of [facility] CM/SW Contact:  Candie Chroman, LCSW Phone Number: 12/04/2020, 1:34 PM   Clinical Narrative:  Patient has orders to discharge to [facility] today. RN will call report to [phone] (Room 108). EMS transport has been arranged and he is 7th on the list. No further concerns. CSW signing off.  Final next level of care: [place] Barriers to Discharge: Barriers Resolved   Patient Goals and CMS Choice Patient states their goals for this hospitalization and ongoing recovery are:: Patient wants to go home but understands that he may need rehab CMS Medicare.gov Compare Post Acute Care list provided to:: Patient Choice offered to / list presented to : Patient,Spouse  Discharge Placement PASRR number recieved: 12/04/20            Patient chooses bed at: [facility] Patient to be transferred to facility by: EMS Name of family member notified: Neill Loft Patient and family notified of of transfer: 12/04/20  Discharge Plan and Services   Discharge Planning Services: CM Consult Post Acute Care Choice: [place]          DME Arranged: N/A DME Agency: NA       HH Arranged: NA HH Agency: NA        Social Determinants of Health (SDOH) Interventions     Readmission Risk Interventions No flowsheet data found.

## 2020-12-04 NOTE — Progress Notes (Signed)
Report provided to [place], RN with [facility]. All questions answered at time of report. EMS transport requested. Pt has been informed the he is seventh on list for EMS transport.

## 2020-12-04 NOTE — Progress Notes (Signed)
PROGRESS NOTE    Peter Ryan  OZH:[id number] DOB: 22-Jan-1951 DOA: 11/30/2020 PCP: [name]., MD    Brief Narrative:  70 year old gentleman with history of left pontine stroke with residual right-sided hemiparesis and dysarthria since 9/21, diffuse large B-cell lymphoma status post chemotherapy, hypertension, type 2 diabetes, hyperlipidemia, BPH who presented to the hospital with worsening right-sided weakness from baseline.  Had several episodes of nausea without vomiting, felt dizzy with positional change while in the emergency room. At the emergency room he was found with obvious right-sided hemiparesis.  Potassium 3.  Magnesium 1.5.  CT scan and subsequent MRI were not showing any new stroke.  Admitted as TIA.   Assessment & Plan:   Principal Problem:   Right sided weakness Active Problems:   Hyperlipidemia associated with type 2 diabetes mellitus (HCC)   Controlled type 2 diabetes mellitus with hyperglycemia, without long-term current use of insulin ([place])   Hypertension associated with diabetes ([place])   History of stroke with residual deficit   TIA (transient ischemic attack)  TIA in a patient with history of left MCA stroke and right hemiparesis: Clinical findings, exacerbated weakness and right hemiparesis with history of right-sided weakness.  Gradually improving. CT head findings, no acute findings. MRI of the brain, negative for acute ischemia.  Chronic microvascular changes. CTA of the head and neck, atherosclerosis.  No large vessel occlusion. 2D echocardiogram, recent on previous hospitalization was normal. Antiplatelet therapy, on aspirin at home.  Neurology recommended aspirin 81 mg daily, Plavix 75 mg daily for 3 weeks then aspirin alone. LDL 71.  Already on high intensity statin.  Continue.  At goal. Hemoglobin A1c, 6.9.  At goal. DVT prophylaxis, Lovenox. Therapy recommendations, acute inpatient rehab or SNF. Neurology will arrange for 30 days event  monitoring as outpatient.  Hypokalemia/hypomagnesemia: Severe and persistent.  Not on any potassium lowering medications. Probably losing.  He has chronic mucoid stools.  Will check ova and parasites. Regulate bowel movements. Continue high-dose potassium and magnesium replacement.  Check in 1 week.  Essential hypertension: Blood pressure is stable.  Resume amlodipine, clonidine and losartan.  Type 2 diabetes: Well controlled.  On Amaryl at home.  Resume on discharge.  Currently on sliding scale.  Resume Amaryl on discharge.  Diffuse large B-cell lymphoma/IgA MGUS: Followed by oncology outpatient.  Medically stable.  Transfer to SNF when bed available.   DVT prophylaxis: Lovenox subcu   Code Status: Full code Family Communication: Wife at the bedside Disposition Plan: Status is: Inpatient.   Dispo: The patient is from: Home              Anticipated d/c is to: CIR denied.  Refer to SNF.              Patient currently is medically stable.   Difficult to place patient No         Consultants:   Neurology  Procedures:   None  Antimicrobials:   None   Subjective: Patient seen and examined.  No new events.  He does complains of chronic intermittent multiple episodes of mucoid stools with no abdominal pain or cramping.  Patient was scheduled for colonoscopy, however they would like to defer that because of hemiplegia and rehab.  Objective: Vitals:   12/03/20 1943 12/04/20 0005 12/04/20 0432 12/04/20 0819  BP: (!) 159/83 (!) 155/77 (!) 160/79 (!) 163/74  Pulse: 88 73 77 80  Resp: 16 16 16 14   Temp: 98.2 F (36.8 C) (!) 97.5 F (36.4  C) 98.1 F (36.7 C) 98.4 F (36.9 C)  TempSrc: Oral Oral    SpO2: 92% 96% 92% 95%  Weight:      Height:        Intake/Output Summary (Last 24 hours) at 12/04/2020 0927 Last data filed at 12/03/2020 1402 Gross per 24 hour  Intake 0 ml  Output --  Net 0 ml   Filed Weights   11/30/20 1535 11/30/20 2032  Weight: 122.5 kg 124.2  kg    Examination:  General: Looks comfortable.  On room air. Cardiovascular: S1-S2 normal.  Regular rate rhythm Respiratory: Bilateral clear.  No added sounds. Gastrointestinal: Soft and nontender.  Bowel sounds present Ext: No cyanosis or edema. Neuro: Cranial nerves no deficit. Right upper extremity 3-4/5.  Right lower extremity 4 x 5.  Left is normal.    Data Reviewed: I have personally reviewed following labs and imaging studies  CBC: Recent Labs  Lab 11/30/20 1506 12/01/20 0419 12/02/20 0533  WBC 10.6* 10.2 9.0  NEUTROABS 7.8*  --  5.5  HGB 14.7 14.9 14.0  HCT 43.4 44.1 42.2  MCV 80.7 80.0 83.1  PLT 236 236 151   Basic Metabolic Panel: Recent Labs  Lab 11/30/20 1506 12/01/20 0419 12/01/20 1111 12/02/20 0533 12/04/20 0421  NA 137 140  --  141  --   K 3.0* 2.7* 3.4* 3.0* 3.3*  CL 99 102  --  104  --   CO2 24 26  --  27  --   GLUCOSE 202* 142*  --  168*  --   BUN 11 10  --  14  --   CREATININE 0.79 0.68  --  0.86  --   CALCIUM 8.5* 8.7*  --  8.9  --   MG 1.5* 2.1  --  2.0 1.9  PHOS  --   --   --  3.7  --    GFR: Estimated Creatinine Clearance: 110.2 mL/min (by C-G formula based on SCr of 0.86 mg/dL). Liver Function Tests: Recent Labs  Lab 11/30/20 1506  AST 23  ALT 20  ALKPHOS 79  BILITOT 0.7  PROT 7.3  ALBUMIN 3.8   No results for input(s): LIPASE, AMYLASE in the last 168 hours. No results for input(s): AMMONIA in the last 168 hours. Coagulation Profile: Recent Labs  Lab 11/30/20 1506  INR 1.0   Cardiac Enzymes: No results for input(s): CKTOTAL, CKMB, CKMBINDEX, TROPONINI in the last 168 hours. BNP (last 3 results) No results for input(s): PROBNP in the last 8760 hours. HbA1C: No results for input(s): HGBA1C in the last 72 hours. CBG: Recent Labs  Lab 12/02/20 2156 12/03/20 0741 12/03/20 1233 12/03/20 1609 12/04/20 0825  GLUCAP 149* 159* 176* 150* 177*   Lipid Profile: No results for input(s): CHOL, HDL, LDLCALC, TRIG,  CHOLHDL, LDLDIRECT in the last 72 hours. Thyroid Function Tests: No results for input(s): TSH, T4TOTAL, FREET4, T3FREE, THYROIDAB in the last 72 hours. Anemia Panel: No results for input(s): VITAMINB12, FOLATE, FERRITIN, TIBC, IRON, RETICCTPCT in the last 72 hours. Sepsis Labs: No results for input(s): PROCALCITON, LATICACIDVEN in the last 168 hours.  Recent Results (from the past 240 hour(s))  Resp Panel by RT-PCR (Flu A&B, Covid) Nasopharyngeal Swab     Status: None   Collection Time: 11/30/20  3:06 PM   Specimen: Nasopharyngeal Swab; Nasopharyngeal(NP) swabs in vial transport medium  Result Value Ref Range Status   SARS Coronavirus 2 by RT PCR NEGATIVE NEGATIVE Final    Comment: (NOTE)  SARS-CoV-2 target nucleic acids are NOT DETECTED.  The SARS-CoV-2 RNA is generally detectable in upper respiratory specimens during the acute phase of infection. The lowest concentration of SARS-CoV-2 viral copies this assay can detect is 138 copies/mL. A negative result does not preclude SARS-Cov-2 infection and should not be used as the sole basis for treatment or other patient management decisions. A negative result may occur with  improper specimen collection/handling, submission of specimen other than nasopharyngeal swab, presence of viral mutation(s) within the areas targeted by this assay, and inadequate number of viral copies(<138 copies/mL). A negative result must be combined with clinical observations, patient history, and epidemiological information. The expected result is Negative.  Fact Sheet for Patients:  EntrepreneurPulse.com.au  Fact Sheet for Healthcare Providers:  IncredibleEmployment.be  This test is no t yet approved or cleared by the [place] FDA and  has been authorized for detection and/or diagnosis of SARS-CoV-2 by FDA under an Emergency Use Authorization (EUA). This EUA will remain  in effect (meaning this test can be used) for  the duration of the COVID-19 declaration under Section 564(b)(1) of the Act, 21 U.S.C.section 360bbb-3(b)(1), unless the authorization is terminated  or revoked sooner.       Influenza A by PCR NEGATIVE NEGATIVE Final   Influenza B by PCR NEGATIVE NEGATIVE Final    Comment: (NOTE) The Xpert Xpress SARS-CoV-2/FLU/RSV plus assay is intended as an aid in the diagnosis of influenza from Nasopharyngeal swab specimens and should not be used as a sole basis for treatment. Nasal washings and aspirates are unacceptable for Xpert Xpress SARS-CoV-2/FLU/RSV testing.  Fact Sheet for Patients: EntrepreneurPulse.com.au  Fact Sheet for Healthcare Providers: IncredibleEmployment.be  This test is not yet approved or cleared by the [place] FDA and has been authorized for detection and/or diagnosis of SARS-CoV-2 by FDA under an Emergency Use Authorization (EUA). This EUA will remain in effect (meaning this test can be used) for the duration of the COVID-19 declaration under Section 564(b)(1) of the Act, 21 U.S.C. section 360bbb-3(b)(1), unless the authorization is terminated or revoked.  Performed at [facility], [address] [place], [place] [address]          Radiology Studies: No results found.      Scheduled Meds: . allopurinol  300 mg Oral Daily  . amLODipine  10 mg Oral Daily  . aspirin  325 mg Oral Daily  . atorvastatin  80 mg Oral Daily  . cloNIDine  0.1 mg Oral BID  . clopidogrel  75 mg Oral Daily  . enoxaparin (LOVENOX) injection  60 mg Subcutaneous Q24H  . finasteride  5 mg Oral Daily  . insulin aspart  0-9 Units Subcutaneous TID WC  . losartan  100 mg Oral Daily  . magnesium oxide  800 mg Oral Daily  . potassium chloride  40 mEq Oral BID   Continuous Infusions:    LOS: 3 days    Time spent: 28 minutes    Barb Merino, MD [facility] Pager [phone]

## 2020-12-04 NOTE — Discharge Summary (Signed)
Physician Discharge Summary  Peter Ryan XNA:[id number] DOB: 1951-06-05 DOA: 11/30/2020  PCP: [name]., MD  Admit date: 11/30/2020 Discharge date: 12/04/2020  Admitted From: home  Disposition:  SNF  Recommendations for Outpatient Follow-up:  1. Follow up with PCP in 1-2 weeks after discharge  2. Please obtain BMP/CBC in one week  Home Health:NA  Equipment/Devices:NA   Discharge Condition: stable   CODE STATUS:full code  Diet recommendation: low salt, low carb diet   Discharge  Summary: 70 year old gentleman with history of left pontine stroke with residual right-sided hemiparesis and dysarthria since 9/21, diffuse large B-cell lymphoma status post chemotherapy, hypertension, type 2 diabetes, hyperlipidemia, BPH who presented to the hospital with worsening right-sided weakness from baseline.  Had several episodes of nausea without vomiting, felt dizzy with positional change while in the emergency room. At the emergency room he was found with obvious right-sided hemiparesis.  Potassium 3.  Magnesium 1.5.  CT scan and subsequent MRI were not showing any new stroke.  Admitted as TIA.   Assessment & Plan of care:   TIA in a patient with history of left MCA stroke and right hemiparesis: Clinical findings, exacerbated weakness and right hemiparesis with history of right-sided weakness.  Gradually improving. CT head findings, no acute findings. MRI of the brain, negative for acute ischemia.  Chronic microvascular changes. CTA of the head and neck, atherosclerosis.  No large vessel occlusion. 2D echocardiogram, recent on previous hospitalization was normal. Antiplatelet therapy, on aspirin at home.  Neurology recommended aspirin 81 mg daily, Plavix 75 mg daily for 3 weeks then aspirin alone. LDL 71.  Already on high intensity statin.  Continue.  At goal. Hemoglobin A1c, 6.9.  At goal. Therapy recommendations, acute inpatient rehab or SNF. Neurology will arrange for 30 days  event monitoring as outpatient.  Hypokalemia/hypomagnesemia: Severe and persistent.  Not on any potassium lowering medications. Probably losing via GI. He has chronic mucoid stools.  Will check ova and parasites if he makes any stool. Regulate bowel movements. Continue high-dose potassium and magnesium replacement.  Check in 1 week.  Essential hypertension: Blood pressure is stable.  Resume amlodipine, clonidine and losartan.  Type 2 diabetes: Well controlled.  On Amaryl at home.  Resume on discharge.  Currently on sliding scale.  Resume Amaryl on discharge.  Diffuse large B-cell lymphoma/IgA MGUS: Followed by oncology outpatient.  Medically stable.  Transfer to SNF when bed available.    Discharge Diagnoses:  Principal Problem:   Right sided weakness Active Problems:   Hyperlipidemia associated with type 2 diabetes mellitus (HCC)   Controlled type 2 diabetes mellitus with hyperglycemia, without long-term current use of insulin ([place])   Hypertension associated with diabetes ([place])   History of stroke with residual deficit   TIA (transient ischemic attack)    Discharge Instructions  Discharge Instructions    Diet - low sodium heart healthy   Complete by: As directed    Diet Carb Modified   Complete by: As directed    Increase activity slowly   Complete by: As directed      Allergies as of 12/04/2020      Reactions   Hctz [hydrochlorothiazide] Other (See Comments)   Gout flare   Metformin Other (See Comments)   Stomach upset      Medication List    TAKE these medications   acetaminophen 325 MG tablet Commonly known as: TYLENOL Take 2 tablets (650 mg total) by mouth every 4 (four) hours as needed for mild pain (  or temp > 37.5 C (99.5 F)).   allopurinol 300 MG tablet Commonly known as: ZYLOPRIM Take 1 tablet (300 mg total) by mouth daily.   amLODipine 10 MG tablet Commonly known as: NORVASC Take 1 tablet (10 mg total) by mouth daily.   aspirin 325 MG EC  tablet Take 1 tablet (325 mg total) by mouth daily.   atorvastatin 80 MG tablet Commonly known as: LIPITOR TAKE ONE TABLET (80 MG) BY MOUTH EVERY DAY What changed: See the new instructions.   cloNIDine 0.1 MG tablet Commonly known as: CATAPRES Take 1 tablet (0.1 mg total) by mouth 2 (two) times daily.   clopidogrel 75 MG tablet Commonly known as: PLAVIX Take 1 tablet (75 mg total) by mouth daily for 19 days. Start taking on: December 05, 2020   finasteride 5 MG tablet Commonly known as: PROSCAR Take 1 tablet (5 mg total) by mouth daily.   glimepiride 2 MG tablet Commonly known as: AMARYL TAKE 1 TABLET BY MOUTH DAILY WITH BREAKFAST   hydrocortisone 2.5 % lotion SMARTSIG:Sparingly Topical 3 Times a Week   losartan 100 MG tablet Commonly known as: COZAAR TAKE ONE TABLET EVERY DAY BY MOUTH What changed:   how much to take  how to take this  when to take this   magnesium oxide 400 (241.3 Mg) MG tablet Commonly known as: MAG-OX Take 2 tablets (800 mg total) by mouth daily. Start taking on: December 05, 2020   polyethylene glycol 17 g packet Commonly known as: MIRALAX / GLYCOLAX Take 17 g by mouth daily.   Potassium Chloride ER 20 MEQ Tbcr Take 20 mEq by mouth 2 (two) times daily for 10 days.   vitamin B-12 1000 MCG tablet Commonly known as: CYANOCOBALAMIN Take 1 tablet (1,000 mcg total) by mouth daily.   Vitamin D3 25 MCG (1000 UT) Caps Take 1 capsule (1,000 Units total) by mouth daily.       Follow-up Information    [name]., MD Follow up in 2 week(s).   Specialty: Family Medicine Contact information: [address] [place] [address] [phone]              Allergies  Allergen Reactions  . Hctz [Hydrochlorothiazide] Other (See Comments)    Gout flare  . Metformin Other (See Comments)    Stomach upset     Consultations:  Neurology    Procedures/Studies: MR BRAIN WO CONTRAST  Result Date: 11/30/2020 CLINICAL  DATA:  Neuro deficit, acute stroke suspected. EXAM: MRI HEAD WITHOUT CONTRAST TECHNIQUE: Multiplanar, multiecho pulse sequences of the brain and surrounding structures were obtained without intravenous contrast. COMPARISON:  Same day head CT.  MRI June 08, 2020. FINDINGS: Mildly motion limited study. Brain: No acute infarction, hemorrhage, hydrocephalus, extra-axial collection or mass lesion. Advanced similar extensive T2/FLAIR hyperintensity within the white matter, compatible with chronic microvascular ischemic disease. Similar remote infarcts involving the right basal ganglia, bilateral thalami, and left corona radiata. Focus of susceptibility in the right thalamus, compatible with prior microhemorrhage. Vascular: Better evaluated on same day CTA. Major arterial flow voids are maintained at the skull base. Skull and upper cervical spine: Normal marrow signal. Sinuses/Orbits: Sinuses are largely clear. Other: No sizable mastoid effusion. IMPRESSION: 1. No evidence of acute intracranial abnormality. 2. Similar advanced chronic microvascular ischemic disease and remote infarcts. Electronically Signed   By: Margaretha Sheffield MD   On: 11/30/2020 17:20   CT HEAD CODE STROKE WO CONTRAST  Result Date: 11/30/2020 CLINICAL DATA:  Code stroke.  Acute neuro deficit.  Rule out stroke. EXAM: CT HEAD WITHOUT CONTRAST TECHNIQUE: Contiguous axial images were obtained from the base of the skull through the vertex without intravenous contrast. COMPARISON:  CT head 06/05/2020.  MRI 06/08/2020. FINDINGS: Brain: Generalized atrophy. Negative for hydrocephalus. Moderate to advanced white matter hypodensity diffusely and bilaterally appears chronic. Chronic infarct right basal ganglia. Chronic infarct midline pons, best seen on MRI. Negative for acute infarct, hemorrhage, mass. Vascular: Negative for hyperdense vessel. Atherosclerotic calcification in the carotid and vertebral arteries. Skull: Negative Sinuses/Orbits: Paranasal  sinuses clear. No orbital lesion. Right cataract extraction. Other: None ASPECTS ([place] Stroke Program Early CT Score) - Ganglionic level infarction (caudate, lentiform nuclei, internal capsule, insula, M1-M3 cortex): 7 - Supraganglionic infarction (M4-M6 cortex): 3 Total score (0-10 with 10 being normal): 10 IMPRESSION: 1. No acute abnormality 2. ASPECTS is 10 3. Moderate to extensive chronic microvascular ischemic change in the white matter. 4. These results were called by telephone at the time of interpretation on 11/30/2020 at 3:25 pm to provider [facility] , who verbally acknowledged these results. Electronically Signed   By: Franchot Gallo M.D.   On: 11/30/2020 15:26   CT ANGIO HEAD CODE STROKE  Addendum Date: 11/30/2020   ADDENDUM REPORT: 11/30/2020 16:06 ADDENDUM: These results were called by telephone at the time of interpretation on 11/30/2020 at 4:00 pm to provider [facility] , who verbally acknowledged these results. Electronically Signed   By: Kellie Simmering DO   On: 11/30/2020 16:06   Result Date: 11/30/2020 CLINICAL DATA:  Stroke/TIA, assess intracranial arteries. Stroke/TIA, assess extracranial arteries. EXAM: CT ANGIOGRAPHY HEAD AND NECK TECHNIQUE: Multidetector CT imaging of the head and neck was performed using the standard protocol during bolus administration of intravenous contrast. Multiplanar CT image reconstructions and MIPs were obtained to evaluate the vascular anatomy. Carotid stenosis measurements (when applicable) are obtained utilizing NASCET criteria, using the distal internal carotid diameter as the denominator. CONTRAST:  15mL OMNIPAQUE IOHEXOL 350 MG/ML SOLN COMPARISON:  Noncontrast head CT performed earlier today 11/30/2020. Brain MRI 06/08/2020. CT angiogram head/neck 06/06/2019 FINDINGS: CTA NECK FINDINGS Aortic arch: Standard aortic branching. No hemodynamically significant innominate or proximal subclavian artery stenosis. Right carotid system: CCA and ICA patent within  the neck without significant stenosis (50% or greater). Mild soft and calcified plaque within the carotid bifurcation and proximal ICA. Left carotid system: CCA and ICA patent within the neck without significant stenosis (50% or greater). Mild soft and calcified plaque within the carotid bifurcation and proximal ICA. Vertebral arteries: The right vertebral artery is significantly dominant. Redemonstrated mild/moderate atherosclerotic narrowing of the proximal right V1 segment. Nonstenotic calcified plaque within the distal right V3 segment. The left vertebral artery is developmentally diminutive, but patent within the neck. Nonstenotic calcified plaque at the origin of this vessel. Skeleton: Cervical spondylosis. No acute bony abnormality or aggressive osseous lesion. Other neck: No neck mass or cervical lymphadenopathy. Subcentimeter thyroid nodules not meeting consensus criteria for ultrasound follow-up. Upper chest: No consolidation within the imaged lung apices. Review of the MIP images confirms the above findings CTA HEAD FINDINGS Anterior circulation: The intracranial right internal carotid artery is patent. Mild calcified plaque within this vessel with no more than mild stenosis. The intracranial left internal carotid artery is patent. Soft plaque results in severe stenosis of the pre cavernous left ICA (series 8, image 110) (series 10, image 139). In retrospect, this stenosis was present on the prior CTA of 06/05/2020. Nonstenotic calcified plaque more distally  within the distal cavernous and paraclinoid left ICA. The M1 middle cerebral arteries are patent. No M2 proximal branch occlusion is identified. The anterior cerebral arteries are patent. There is atherosclerotic irregularity of the branch vessels. Most notably, there is redemonstration of a moderate stenosis within a proximal M2 left MCA branch (series 9, image 20). Also redemonstrated, there is a median artery of the corpus callosum with a  moderate/severe stenosis distally (series 11, image 25). No intracranial aneurysm is identified. Posterior circulation: The dominant intracranial right vertebral artery is patent. Redemonstrated moderate stenosis within the V4 right vertebral artery. The non dominant V4 left vertebral artery is patent with redemonstration of multifocal severe stenoses. The basilar artery is patent. Fetal origin right posterior cerebral artery. The posterior cerebral arteries are patent. A right posterior communicating artery is present. The left posterior communicating artery is hypoplastic or absent. Venous sinuses: Within the limitations of contrast timing, no convincing thrombus. Anatomic variants: As described Review of the MIP images confirms the above findings IMPRESSION: CTA neck: 1. The common carotid and internal carotid arteries are patent within the neck without hemodynamically significant stenosis. Mild atherosclerotic disease within the carotid systems within the neck as described. 2. Vertebral arteries patent within the neck. Redemonstrated mild/moderate atherosclerotic narrowing of the proximal V1 segment of the dominant right vertebral artery. The left vertebral artery is developmentally diminutive. CTA head: 1. No intracranial large vessel occlusion. 2. No significant interval change as compared to the CTA head/neck of 06/05/2020. 3. Intracranial atherosclerotic disease with multifocal stenoses, most notably as follows. 4. Severe atherosclerotic narrowing of the pre cavernous left internal carotid artery. 5. Moderate stenosis within a proximal M2 left MCA branch vessel. 6. Severe stenosis within the median artery of the corpus callosum somewhat distally. 7. Moderate atherosclerotic stenosis of the dominant V4 right vertebral artery. 8. Severe stenoses within the non dominant V4 left vertebral artery. Electronically Signed: By: Kellie Simmering DO On: 11/30/2020 15:59   CT ANGIO NECK CODE STROKE  Addendum Date:  11/30/2020   ADDENDUM REPORT: 11/30/2020 16:06 ADDENDUM: These results were called by telephone at the time of interpretation on 11/30/2020 at 4:00 pm to provider [facility] , who verbally acknowledged these results. Electronically Signed   By: Kellie Simmering DO   On: 11/30/2020 16:06   Result Date: 11/30/2020 CLINICAL DATA:  Stroke/TIA, assess intracranial arteries. Stroke/TIA, assess extracranial arteries. EXAM: CT ANGIOGRAPHY HEAD AND NECK TECHNIQUE: Multidetector CT imaging of the head and neck was performed using the standard protocol during bolus administration of intravenous contrast. Multiplanar CT image reconstructions and MIPs were obtained to evaluate the vascular anatomy. Carotid stenosis measurements (when applicable) are obtained utilizing NASCET criteria, using the distal internal carotid diameter as the denominator. CONTRAST:  69mL OMNIPAQUE IOHEXOL 350 MG/ML SOLN COMPARISON:  Noncontrast head CT performed earlier today 11/30/2020. Brain MRI 06/08/2020. CT angiogram head/neck 06/06/2019 FINDINGS: CTA NECK FINDINGS Aortic arch: Standard aortic branching. No hemodynamically significant innominate or proximal subclavian artery stenosis. Right carotid system: CCA and ICA patent within the neck without significant stenosis (50% or greater). Mild soft and calcified plaque within the carotid bifurcation and proximal ICA. Left carotid system: CCA and ICA patent within the neck without significant stenosis (50% or greater). Mild soft and calcified plaque within the carotid bifurcation and proximal ICA. Vertebral arteries: The right vertebral artery is significantly dominant. Redemonstrated mild/moderate atherosclerotic narrowing of the proximal right V1 segment. Nonstenotic calcified plaque within the distal right V3 segment. The left vertebral artery is developmentally  diminutive, but patent within the neck. Nonstenotic calcified plaque at the origin of this vessel. Skeleton: Cervical spondylosis. No  acute bony abnormality or aggressive osseous lesion. Other neck: No neck mass or cervical lymphadenopathy. Subcentimeter thyroid nodules not meeting consensus criteria for ultrasound follow-up. Upper chest: No consolidation within the imaged lung apices. Review of the MIP images confirms the above findings CTA HEAD FINDINGS Anterior circulation: The intracranial right internal carotid artery is patent. Mild calcified plaque within this vessel with no more than mild stenosis. The intracranial left internal carotid artery is patent. Soft plaque results in severe stenosis of the pre cavernous left ICA (series 8, image 110) (series 10, image 139). In retrospect, this stenosis was present on the prior CTA of 06/05/2020. Nonstenotic calcified plaque more distally within the distal cavernous and paraclinoid left ICA. The M1 middle cerebral arteries are patent. No M2 proximal branch occlusion is identified. The anterior cerebral arteries are patent. There is atherosclerotic irregularity of the branch vessels. Most notably, there is redemonstration of a moderate stenosis within a proximal M2 left MCA branch (series 9, image 20). Also redemonstrated, there is a median artery of the corpus callosum with a moderate/severe stenosis distally (series 11, image 25). No intracranial aneurysm is identified. Posterior circulation: The dominant intracranial right vertebral artery is patent. Redemonstrated moderate stenosis within the V4 right vertebral artery. The non dominant V4 left vertebral artery is patent with redemonstration of multifocal severe stenoses. The basilar artery is patent. Fetal origin right posterior cerebral artery. The posterior cerebral arteries are patent. A right posterior communicating artery is present. The left posterior communicating artery is hypoplastic or absent. Venous sinuses: Within the limitations of contrast timing, no convincing thrombus. Anatomic variants: As described Review of the MIP images  confirms the above findings IMPRESSION: CTA neck: 1. The common carotid and internal carotid arteries are patent within the neck without hemodynamically significant stenosis. Mild atherosclerotic disease within the carotid systems within the neck as described. 2. Vertebral arteries patent within the neck. Redemonstrated mild/moderate atherosclerotic narrowing of the proximal V1 segment of the dominant right vertebral artery. The left vertebral artery is developmentally diminutive. CTA head: 1. No intracranial large vessel occlusion. 2. No significant interval change as compared to the CTA head/neck of 06/05/2020. 3. Intracranial atherosclerotic disease with multifocal stenoses, most notably as follows. 4. Severe atherosclerotic narrowing of the pre cavernous left internal carotid artery. 5. Moderate stenosis within a proximal M2 left MCA branch vessel. 6. Severe stenosis within the median artery of the corpus callosum somewhat distally. 7. Moderate atherosclerotic stenosis of the dominant V4 right vertebral artery. 8. Severe stenoses within the non dominant V4 left vertebral artery. Electronically Signed: By: Kellie Simmering DO On: 11/30/2020 15:59    (Echo, Carotid, EGD, Colonoscopy, ERCP)    Subjective: patient seen and examined. No new events. Looking forward to go to rehab    Discharge Exam: Vitals:   12/04/20 0432 12/04/20 0819  BP: (!) 160/79 (!) 163/74  Pulse: 77 80  Resp: 16 14  Temp: 98.1 F (36.7 C) 98.4 F (36.9 C)  SpO2: 92% 95%   Vitals:   12/03/20 1943 12/04/20 0005 12/04/20 0432 12/04/20 0819  BP: (!) 159/83 (!) 155/77 (!) 160/79 (!) 163/74  Pulse: 88 73 77 80  Resp: 16 16 16 14   Temp: 98.2 F (36.8 C) (!) 97.5 F (36.4 C) 98.1 F (36.7 C) 98.4 F (36.9 C)  TempSrc: Oral Oral    SpO2: 92% 96% 92% 95%  Weight:  Height:        General: Looks comfortable.  On room air. Cardiovascular: S1-S2 normal.  Regular rate rhythm Respiratory: Bilateral clear.  No added  sounds. Gastrointestinal: Soft and nontender.  Bowel sounds present Ext: No cyanosis or edema. Neuro: Cranial nerves no deficit. Right upper extremity 3-4/5.  Right lower extremity 4 x 5.  Left is normal.    The results of significant diagnostics from this hospitalization (including imaging, microbiology, ancillary and laboratory) are listed below for reference.     Microbiology: Recent Results (from the past 240 hour(s))  Resp Panel by RT-PCR (Flu A&B, Covid) Nasopharyngeal Swab     Status: None   Collection Time: 11/30/20  3:06 PM   Specimen: Nasopharyngeal Swab; Nasopharyngeal(NP) swabs in vial transport medium  Result Value Ref Range Status   SARS Coronavirus 2 by RT PCR NEGATIVE NEGATIVE Final    Comment: (NOTE) SARS-CoV-2 target nucleic acids are NOT DETECTED.  The SARS-CoV-2 RNA is generally detectable in upper respiratory specimens during the acute phase of infection. The lowest concentration of SARS-CoV-2 viral copies this assay can detect is 138 copies/mL. A negative result does not preclude SARS-Cov-2 infection and should not be used as the sole basis for treatment or other patient management decisions. A negative result may occur with  improper specimen collection/handling, submission of specimen other than nasopharyngeal swab, presence of viral mutation(s) within the areas targeted by this assay, and inadequate number of viral copies(<138 copies/mL). A negative result must be combined with clinical observations, patient history, and epidemiological information. The expected result is Negative.  Fact Sheet for Patients:  EntrepreneurPulse.com.au  Fact Sheet for Healthcare Providers:  IncredibleEmployment.be  This test is no t yet approved or cleared by the [place] FDA and  has been authorized for detection and/or diagnosis of SARS-CoV-2 by FDA under an Emergency Use Authorization (EUA). This EUA will remain  in effect  (meaning this test can be used) for the duration of the COVID-19 declaration under Section 564(b)(1) of the Act, 21 U.S.C.section 360bbb-3(b)(1), unless the authorization is terminated  or revoked sooner.       Influenza A by PCR NEGATIVE NEGATIVE Final   Influenza B by PCR NEGATIVE NEGATIVE Final    Comment: (NOTE) The Xpert Xpress SARS-CoV-2/FLU/RSV plus assay is intended as an aid in the diagnosis of influenza from Nasopharyngeal swab specimens and should not be used as a sole basis for treatment. Nasal washings and aspirates are unacceptable for Xpert Xpress SARS-CoV-2/FLU/RSV testing.  Fact Sheet for Patients: EntrepreneurPulse.com.au  Fact Sheet for Healthcare Providers: IncredibleEmployment.be  This test is not yet approved or cleared by the [place] FDA and has been authorized for detection and/or diagnosis of SARS-CoV-2 by FDA under an Emergency Use Authorization (EUA). This EUA will remain in effect (meaning this test can be used) for the duration of the COVID-19 declaration under Section 564(b)(1) of the Act, 21 U.S.C. section 360bbb-3(b)(1), unless the authorization is terminated or revoked.  Performed at [facility], [address] [place], [place] [address]      Labs: BNP (last 3 results) No results for input(s): BNP in the last 8760 hours. Basic Metabolic Panel: Recent Labs  Lab 11/30/20 1506 12/01/20 0419 12/01/20 1111 12/02/20 0533 12/04/20 0421  NA 137 140  --  141  --   K 3.0* 2.7* 3.4* 3.0* 3.3*  CL 99 102  --  104  --   CO2 24 26  --  27  --   GLUCOSE  202* 142*  --  168*  --   BUN 11 10  --  14  --   CREATININE 0.79 0.68  --  0.86  --   CALCIUM 8.5* 8.7*  --  8.9  --   MG 1.5* 2.1  --  2.0 1.9  PHOS  --   --   --  3.7  --    Liver Function Tests: Recent Labs  Lab 11/30/20 1506  AST 23  ALT 20  ALKPHOS 79  BILITOT 0.7  PROT 7.3  ALBUMIN 3.8   No results for input(s): LIPASE,  AMYLASE in the last 168 hours. No results for input(s): AMMONIA in the last 168 hours. CBC: Recent Labs  Lab 11/30/20 1506 12/01/20 0419 12/02/20 0533  WBC 10.6* 10.2 9.0  NEUTROABS 7.8*  --  5.5  HGB 14.7 14.9 14.0  HCT 43.4 44.1 42.2  MCV 80.7 80.0 83.1  PLT 236 236 215   Cardiac Enzymes: No results for input(s): CKTOTAL, CKMB, CKMBINDEX, TROPONINI in the last 168 hours. BNP: Invalid input(s): POCBNP CBG: Recent Labs  Lab 12/02/20 2156 12/03/20 0741 12/03/20 1233 12/03/20 1609 12/04/20 0825  GLUCAP 149* 159* 176* 150* 177*   D-Dimer No results for input(s): DDIMER in the last 72 hours. Hgb A1c No results for input(s): HGBA1C in the last 72 hours. Lipid Profile No results for input(s): CHOL, HDL, LDLCALC, TRIG, CHOLHDL, LDLDIRECT in the last 72 hours. Thyroid function studies No results for input(s): TSH, T4TOTAL, T3FREE, THYROIDAB in the last 72 hours.  Invalid input(s): FREET3 Anemia work up No results for input(s): VITAMINB12, FOLATE, FERRITIN, TIBC, IRON, RETICCTPCT in the last 72 hours. Urinalysis    Component Value Date/Time   COLORURINE YELLOW (A) 11/30/2020 1757   APPEARANCEUR HAZY (A) 11/30/2020 1757   APPEARANCEUR Clear 07/18/2016 0927   LABSPEC >1.046 (H) 11/30/2020 1757   PHURINE 6.0 11/30/2020 1757   GLUCOSEU 50 (A) 11/30/2020 1757   HGBUR NEGATIVE 11/30/2020 1757   BILIRUBINUR NEGATIVE 11/30/2020 1757   BILIRUBINUR negative 09/07/2020 1505   BILIRUBINUR Negative 07/18/2016 0927   KETONESUR NEGATIVE 11/30/2020 1757   PROTEINUR >=300 (A) 11/30/2020 1757   UROBILINOGEN 0.2 09/07/2020 1505   NITRITE NEGATIVE 11/30/2020 1757   LEUKOCYTESUR NEGATIVE 11/30/2020 1757   Sepsis Labs Invalid input(s): PROCALCITONIN,  WBC,  LACTICIDVEN Microbiology Recent Results (from the past 240 hour(s))  Resp Panel by RT-PCR (Flu A&B, Covid) Nasopharyngeal Swab     Status: None   Collection Time: 11/30/20  3:06 PM   Specimen: Nasopharyngeal Swab;  Nasopharyngeal(NP) swabs in vial transport medium  Result Value Ref Range Status   SARS Coronavirus 2 by RT PCR NEGATIVE NEGATIVE Final    Comment: (NOTE) SARS-CoV-2 target nucleic acids are NOT DETECTED.  The SARS-CoV-2 RNA is generally detectable in upper respiratory specimens during the acute phase of infection. The lowest concentration of SARS-CoV-2 viral copies this assay can detect is 138 copies/mL. A negative result does not preclude SARS-Cov-2 infection and should not be used as the sole basis for treatment or other patient management decisions. A negative result may occur with  improper specimen collection/handling, submission of specimen other than nasopharyngeal swab, presence of viral mutation(s) within the areas targeted by this assay, and inadequate number of viral copies(<138 copies/mL). A negative result must be combined with clinical observations, patient history, and epidemiological information. The expected result is Negative.  Fact Sheet for Patients:  EntrepreneurPulse.com.au  Fact Sheet for Healthcare Providers:  IncredibleEmployment.be  This test is no  t yet approved or cleared by the [place] and  has been authorized for detection and/or diagnosis of SARS-CoV-2 by FDA under an Emergency Use Authorization (EUA). This EUA will remain  in effect (meaning this test can be used) for the duration of the COVID-19 declaration under Section 564(b)(1) of the Act, 21 U.S.C.section 360bbb-3(b)(1), unless the authorization is terminated  or revoked sooner.       Influenza A by PCR NEGATIVE NEGATIVE Final   Influenza B by PCR NEGATIVE NEGATIVE Final    Comment: (NOTE) The Xpert Xpress SARS-CoV-2/FLU/RSV plus assay is intended as an aid in the diagnosis of influenza from Nasopharyngeal swab specimens and should not be used as a sole basis for treatment. Nasal washings and aspirates are unacceptable for Xpert Xpress  SARS-CoV-2/FLU/RSV testing.  Fact Sheet for Patients: EntrepreneurPulse.com.au  Fact Sheet for Healthcare Providers: IncredibleEmployment.be  This test is not yet approved or cleared by the [place] FDA and has been authorized for detection and/or diagnosis of SARS-CoV-2 by FDA under an Emergency Use Authorization (EUA). This EUA will remain in effect (meaning this test can be used) for the duration of the COVID-19 declaration under Section 564(b)(1) of the Act, 21 U.S.C. section 360bbb-3(b)(1), unless the authorization is terminated or revoked.  Performed at [facility], [address], [place], [place] [address]      Time coordinating discharge:  32 minutes  SIGNED:   Barb Merino, MD  [facility] 12/04/2020, 11:50 AM

## 2020-12-04 NOTE — NC FL2 (Signed)
[place] LEVEL OF CARE SCREENING TOOL     IDENTIFICATION  Patient Name: Peter Ryan Birthdate: 10/01/1950 Sex: male Admission Date (Current Location): 11/30/2020  [place] and [place] Number:  Engineering geologist and Address:  [facility], [address] [place], [place] [address]      Provider Number: [id number]  Attending Physician Name and Address:  Barb Merino, MD  Relative Name and Phone Number:       Current Level of Care: Hospital Recommended Level of Care: [place] Prior Approval Number:    Date Approved/Denied:   PASRR Number: [id number]  Discharge Plan: SNF    Current Diagnoses: Patient Active Problem List   Diagnosis Date Noted  . TIA (transient ischemic attack) 12/01/2020  . Right sided weakness 11/30/2020  . Hypertension associated with diabetes ([place]) 11/30/2020  . History of stroke with residual deficit 11/30/2020  . Left pontine cerebrovascular accident ([place]) 06/14/2020  . Facial droop   . Rectal bleeding   . Morbid obesity ([place])   . Right hemiparesis ([place])   . Controlled type 2 diabetes mellitus with hyperglycemia, without long-term current use of insulin ([place])   . Acute CVA (cerebrovascular accident) ([place]) 06/05/2020  . Obesity, Class III, BMI 40-49.9 (morbid obesity) ([place]) 06/05/2020  . Secondary malignant neoplasm of retroperitoneum and peritoneum ([place]) 05/02/2020  . Atherosclerosis of aorta ([place]) 05/02/2020  . Goals of care, counseling/discussion 04/08/2017  . 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 Ascites 02/08/2017  . Liver cirrhosis ([place]) 02/08/2017  . SBP (spontaneous bacterial peritonitis) ([place]) 02/07/2017  . 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  . DOE (dyspnea on exertion) 11/15/2016  . Obesity, morbid (more than 100 lbs over ideal weight or BMI > 40) ([place]) 11/15/2016  . Elevated PSA 05/30/2015  . BPH with  obstruction/lower urinary tract symptoms 05/30/2015  . Type 2 diabetes mellitus ([place]) 05/18/2014  . Arthritis 01/03/2014  . Hyperlipidemia associated with type 2 diabetes mellitus ([place]) 01/03/2014  . Benign essential hypertension 01/03/2014  . Gout 01/03/2014  . Psoriasis 01/03/2014  . Reflux 01/03/2014    Orientation RESPIRATION BLADDER Height & Weight     Self,Situation,Time,Place  Normal Incontinent Weight: 273 lb 13 oz (124.2 kg) Height:  5\' 10"  (177.8 cm)  BEHAVIORAL SYMPTOMS/MOOD NEUROLOGICAL BOWEL NUTRITION STATUS   (None)  (None) Incontinent Diet (Regular diet. NO STRAWS!  Please Cut Chicken and spaghetti and add gravy/sauce. NO Salads.)  AMBULATORY STATUS COMMUNICATION OF NEEDS Skin   Extensive Assist Verbally Normal                       Personal Care Assistance Level of Assistance  Bathing,Feeding,Dressing Bathing Assistance: Maximum assistance Feeding assistance: Limited assistance Dressing Assistance: Maximum assistance     Functional Limitations Info  Sight,Hearing,Speech Sight Info: Adequate Hearing Info: Adequate Speech Info: Adequate    SPECIAL CARE FACTORS FREQUENCY  PT (By licensed PT),OT (By licensed OT)     PT Frequency: 5 x week OT Frequency: 5 x week            Contractures Contractures Info: Not present    Additional Factors Info  Code Status,Allergies Code Status Info: Full code Allergies Info: Hctz (Hydrochlorothiazide), Metformin           Current Medications (12/04/2020):  This is the current hospital active medication list Current Facility-Administered Medications  Medication Dose Route Frequency Provider Last Rate Last Admin  . acetaminophen (TYLENOL)  tablet 650 mg  650 mg Oral Q4H PRN Lenore Cordia, MD       Or  . acetaminophen (TYLENOL) 160 MG/5ML solution 650 mg  650 mg Per Tube Q4H PRN Lenore Cordia, MD       Or  . acetaminophen (TYLENOL) suppository 650 mg  650 mg Rectal Q4H PRN Zada Finders R, MD      .  allopurinol (ZYLOPRIM) tablet 300 mg  300 mg Oral Daily Zada Finders R, MD   300 mg at 12/04/20 0924  . amLODipine (NORVASC) tablet 10 mg  10 mg Oral Daily Lenore Cordia, MD   10 mg at 12/04/20 0925  . aspirin EC tablet 325 mg  325 mg Oral Daily Lenore Cordia, MD   325 mg at 12/04/20 1062  . atorvastatin (LIPITOR) tablet 80 mg  80 mg Oral Daily Lenore Cordia, MD   80 mg at 12/04/20 0924  . cloNIDine (CATAPRES) tablet 0.1 mg  0.1 mg Oral BID Zada Finders R, MD   0.1 mg at 12/04/20 0925  . clopidogrel (PLAVIX) tablet 75 mg  75 mg Oral Daily Lenore Cordia, MD   75 mg at 12/04/20 0925  . enoxaparin (LOVENOX) injection 60 mg  60 mg Subcutaneous Q24H Barb Merino, MD   60 mg at 12/03/20 2047  . finasteride (PROSCAR) tablet 5 mg  5 mg Oral Daily Lenore Cordia, MD   5 mg at 12/04/20 0925  . insulin aspart (novoLOG) injection 0-9 Units  0-9 Units Subcutaneous TID WC Lenore Cordia, MD   2 Units at 12/04/20 [phone]  . losartan (COZAAR) tablet 100 mg  100 mg Oral Daily Zada Finders R, MD   100 mg at 12/04/20 0925  . magnesium oxide (MAG-OX) tablet 800 mg  800 mg Oral Daily Barb Merino, MD   800 mg at 12/04/20 0925  . polyethylene glycol (MIRALAX / GLYCOLAX) packet 17 g  17 g Oral Daily PRN Barb Merino, MD      . potassium chloride (KLOR-CON) packet 40 mEq  40 mEq Oral BID Barb Merino, MD   40 mEq at 12/04/20 0925  . senna-docusate (Senokot-S) tablet 1 tablet  1 tablet Oral QHS PRN Lenore Cordia, MD       Facility-Administered Medications Ordered in Other Encounters  Medication Dose Route Frequency Provider Last Rate Last Admin  . sodium chloride flush (NS) 0.9 % injection 10 mL  10 mL Intravenous PRN Sindy Guadeloupe, MD   10 mL at 05/12/18 1421     Discharge Medications: Please see discharge summary for a list of discharge medications.  Relevant Imaging Results:  Relevant Lab Results:   Additional Information SS#: [id number]. Patient stated he has received both vaccines and  booster.  Candie Chroman, LCSW

## 2020-12-04 NOTE — TOC Progression Note (Addendum)
Transition of Care (TOC) - Progression Note    Patient Details  Name: Peter Ryan MRN: [id number] Date of Birth: 03/31/1951  Transition of Care (TOC) CM/SW Contact  Sarah C Boswell, LCSW Phone Number: 12/04/2020, 11:19 AM  Clinical Narrative:  Met with patient and his wife to discuss SNF placement. Provided CMS scores for facilities within 25 miles of their zip code. First preference is [facility], second preference is [facility]. Left message for [facility] admissions coordinator asking her to review. Patient is fully vaccinated and has received booster: Moderna 12/03/19, 12/31/19, and 09/26/20.  11:49 am: [facility] has a bed available today as long as orders are in by 2:00. MD, patient, and wife are aware.  Expected Discharge Plan: Skilled Nursing Facility Barriers to Discharge: Continued Medical Work up  Expected Discharge Plan and Services Expected Discharge Plan: Skilled Nursing Facility   Discharge Planning Services: CM Consult Post Acute Care Choice: Skilled Nursing Facility Living arrangements for the past 2 months: Single Family Home                 DME Arranged: N/A DME Agency: NA       HH Arranged: NA HH Agency: NA         Social Determinants of Health (SDOH) Interventions    Readmission Risk Interventions No flowsheet data found.  

## 2020-12-05 ENCOUNTER — Telehealth: Payer: Self-pay

## 2020-12-05 NOTE — Telephone Encounter (Signed)
F/u her will depend on how long he is in SNF. Thank you for reaching out to him for TOC.

## 2020-12-05 NOTE — Telephone Encounter (Signed)
  Patient was recently discharged from the hospital on 12/04/20.  No TCM completed, patient does not qualify for TCM services due to being d/c to a SNF.  Per discharge summary patient needs follow up with PCP. No HFU scheduled at this time. Next apt scheduled is a 4 month f/u on 01/31/21. FYI!

## 2020-12-06 ENCOUNTER — Ambulatory Visit: Payer: Medicare Other | Admitting: Physical Therapy

## 2020-12-08 DIAGNOSIS — G459 Transient cerebral ischemic attack, unspecified: Secondary | ICD-10-CM | POA: Diagnosis not present

## 2020-12-08 DIAGNOSIS — E119 Type 2 diabetes mellitus without complications: Secondary | ICD-10-CM | POA: Diagnosis not present

## 2020-12-08 DIAGNOSIS — I10 Essential (primary) hypertension: Secondary | ICD-10-CM | POA: Diagnosis not present

## 2020-12-11 ENCOUNTER — Ambulatory Visit: Payer: Medicare Other | Admitting: Physical Therapy

## 2020-12-11 ENCOUNTER — Ambulatory Visit: Payer: Medicare Other | Admitting: Neurology

## 2020-12-11 DIAGNOSIS — M109 Gout, unspecified: Secondary | ICD-10-CM | POA: Diagnosis not present

## 2020-12-11 DIAGNOSIS — I10 Essential (primary) hypertension: Secondary | ICD-10-CM | POA: Diagnosis not present

## 2020-12-11 DIAGNOSIS — N400 Enlarged prostate without lower urinary tract symptoms: Secondary | ICD-10-CM | POA: Diagnosis not present

## 2020-12-11 DIAGNOSIS — G8191 Hemiplegia, unspecified affecting right dominant side: Secondary | ICD-10-CM | POA: Diagnosis not present

## 2020-12-11 DIAGNOSIS — E1159 Type 2 diabetes mellitus with other circulatory complications: Secondary | ICD-10-CM | POA: Diagnosis not present

## 2020-12-11 DIAGNOSIS — K7460 Unspecified cirrhosis of liver: Secondary | ICD-10-CM | POA: Diagnosis not present

## 2020-12-13 ENCOUNTER — Ambulatory Visit: Payer: Medicare Other | Admitting: Physical Therapy

## 2020-12-18 ENCOUNTER — Ambulatory Visit: Payer: Medicare Other | Admitting: Physical Therapy

## 2020-12-20 ENCOUNTER — Ambulatory Visit: Payer: Medicare Other | Admitting: Physical Therapy

## 2020-12-25 ENCOUNTER — Other Ambulatory Visit: Payer: Self-pay | Admitting: Dermatology

## 2020-12-25 ENCOUNTER — Other Ambulatory Visit: Payer: Self-pay

## 2020-12-25 ENCOUNTER — Ambulatory Visit: Payer: Medicare Other | Admitting: Physical Therapy

## 2020-12-25 MED ORDER — KETOCONAZOLE 2 % EX SHAM: 1.0000 "application " | MEDICATED_SHAMPOO | CUTANEOUS | 0 refills | Status: DC

## 2020-12-25 MED ORDER — HYDROCORTISONE 2.5 % EX LOTN: TOPICAL_LOTION | CUTANEOUS | 0 refills | Status: DC

## 2020-12-25 NOTE — Progress Notes (Signed)
Ketoconazole and Hydrocortisone sent in as 1 RF. Patient recently had stroke and in [place]. Patient spouse advised he needed appt soon due to being one year.

## 2020-12-27 ENCOUNTER — Ambulatory Visit: Payer: Medicare Other | Admitting: Physical Therapy

## 2021-01-01 ENCOUNTER — Ambulatory Visit: Payer: Medicare Other | Admitting: Physical Therapy

## 2021-01-01 DIAGNOSIS — M109 Gout, unspecified: Secondary | ICD-10-CM | POA: Diagnosis not present

## 2021-01-01 DIAGNOSIS — I10 Essential (primary) hypertension: Secondary | ICD-10-CM

## 2021-01-01 DIAGNOSIS — N400 Enlarged prostate without lower urinary tract symptoms: Secondary | ICD-10-CM

## 2021-01-01 DIAGNOSIS — E1159 Type 2 diabetes mellitus with other circulatory complications: Secondary | ICD-10-CM

## 2021-01-01 DIAGNOSIS — G8191 Hemiplegia, unspecified affecting right dominant side: Secondary | ICD-10-CM

## 2021-01-03 ENCOUNTER — Ambulatory Visit: Payer: Medicare Other | Admitting: Physical Therapy

## 2021-01-08 ENCOUNTER — Ambulatory Visit: Payer: Medicare Other | Admitting: Physical Therapy

## 2021-01-10 ENCOUNTER — Telehealth: Payer: Self-pay | Admitting: Oncology

## 2021-01-10 ENCOUNTER — Ambulatory Visit: Payer: Medicare Other | Admitting: Physical Therapy

## 2021-01-10 NOTE — Telephone Encounter (Signed)
Attempts x 2 to contact patient to reschedule appt on 5/31 (MD on PAL). Moving appointments and sending updated AVS in mail.

## 2021-01-15 ENCOUNTER — Ambulatory Visit: Payer: Medicare Other | Admitting: Physical Therapy

## 2021-01-16 ENCOUNTER — Other Ambulatory Visit: Payer: Self-pay | Admitting: Family Medicine

## 2021-01-16 DIAGNOSIS — L209 Atopic dermatitis, unspecified: Secondary | ICD-10-CM | POA: Diagnosis not present

## 2021-01-16 DIAGNOSIS — N138 Other obstructive and reflux uropathy: Secondary | ICD-10-CM

## 2021-01-17 ENCOUNTER — Ambulatory Visit: Payer: Medicare Other | Admitting: Physical Therapy

## 2021-01-31 ENCOUNTER — Ambulatory Visit: Payer: Self-pay | Admitting: Family Medicine

## 2021-02-02 DIAGNOSIS — K7460 Unspecified cirrhosis of liver: Secondary | ICD-10-CM | POA: Diagnosis not present

## 2021-02-02 DIAGNOSIS — N401 Enlarged prostate with lower urinary tract symptoms: Secondary | ICD-10-CM | POA: Diagnosis not present

## 2021-02-02 DIAGNOSIS — E1159 Type 2 diabetes mellitus with other circulatory complications: Secondary | ICD-10-CM | POA: Diagnosis not present

## 2021-02-02 DIAGNOSIS — M1000 Idiopathic gout, unspecified site: Secondary | ICD-10-CM | POA: Diagnosis not present

## 2021-02-02 DIAGNOSIS — I69351 Hemiplegia and hemiparesis following cerebral infarction affecting right dominant side: Secondary | ICD-10-CM | POA: Diagnosis not present

## 2021-02-02 DIAGNOSIS — I10 Essential (primary) hypertension: Secondary | ICD-10-CM | POA: Diagnosis not present

## 2021-02-27 ENCOUNTER — Telehealth: Payer: Self-pay | Admitting: Urology

## 2021-02-27 ENCOUNTER — Other Ambulatory Visit: Payer: Medicare Other

## 2021-02-27 ENCOUNTER — Ambulatory Visit: Payer: Medicare Other | Admitting: Oncology

## 2021-02-27 NOTE — Telephone Encounter (Signed)
Pt called stating he has had another stroke and is unable to keep appts this week. Pt states he should be d/c from rehab on or by the 10th of June.  Pt asks if he can do is visit with you virtually, Please advise as pt states he's not sure when he'll be able to get labs drawn.

## 2021-02-28 DIAGNOSIS — B351 Tinea unguium: Secondary | ICD-10-CM | POA: Diagnosis not present

## 2021-02-28 NOTE — Progress Notes (Signed)
Virtual Visit via Video Note  I connected with Peter Ryan on 03/01/21 at  3:30 PM EDT by a telephone telemedicine application and verified that I am speaking with the correct person using two identifiers.  Location: Patient: [facility] Provider: Office   I discussed the limitations of evaluation and management by telemedicine and the availability of in person appointments. The patient expressed understanding and agreed to proceed.  History of Present Illness: Urological history: 1. Elevated PSA Component     Latest Ref Rng & Units 07/18/2016 01/21/2018 08/24/2019 02/24/2020  Prostate Specific Ag, Serum     0.0 - 4.0 ng/mL 5.3 (H) 1.3 1.6 1.7   2. BPH with LU TS -I PSS 2/2 -managed with finasteride 5 mg daily    Observations/Objective:  IPSS    Row Name 03/01/21 1500         International Prostate Symptom Score   How often have you had the sensation of not emptying your bladder? Not at All     How often have you had to urinate less than every two hours? Not at All     How often have you found you stopped and started again several times when you urinated? Not at All     How often have you found it difficult to postpone urination? Not at All     How often have you had a weak urinary stream? Less than 1 in 5 times     How often have you had to strain to start urination? Not at All     How many times did you typically get up at night to urinate? 1 Time     Total IPSS Score 2           Quality of Life due to urinary symptoms   If you were to spend the rest of your life with your urinary condition just the way it is now how would you feel about that? Mostly Satisfied            Score:  1-7 Mild 8-19 Moderate 20-35 Severe  Assessment and Plan: 70 year old male with a history of elevated PSA and BPH with LUTS who is currently at [facility] recovering from a stroke who is contacted today via telephone for 64-month follow-up.  Follow Up Instructions: He will contact our  office for a follow-up in office visit once he is able to ambulate without assistance.  When he follows up we will obtain a PSA and a DRE.  His finasteride is refilled.   I discussed the assessment and treatment plan with the patient. The patient was provided an opportunity to ask questions and all were answered. The patient agreed with the plan and demonstrated an understanding of the instructions.   The patient was advised to call back or seek an in-person evaluation if the symptoms worsen or if the condition fails to improve as anticipated.  I provided 15 minutes of non-face-to-face time during this encounter.   Leodan Bolyard, PA-C

## 2021-03-01 ENCOUNTER — Other Ambulatory Visit: Payer: Self-pay

## 2021-03-01 ENCOUNTER — Telehealth: Payer: Medicare Other | Admitting: Urology

## 2021-03-01 ENCOUNTER — Telehealth (INDEPENDENT_AMBULATORY_CARE_PROVIDER_SITE_OTHER): Payer: Medicare Other | Admitting: Urology

## 2021-03-01 DIAGNOSIS — I639 Cerebral infarction, unspecified: Secondary | ICD-10-CM

## 2021-03-01 DIAGNOSIS — N138 Other obstructive and reflux uropathy: Secondary | ICD-10-CM

## 2021-03-01 DIAGNOSIS — E1159 Type 2 diabetes mellitus with other circulatory complications: Secondary | ICD-10-CM | POA: Diagnosis not present

## 2021-03-01 DIAGNOSIS — N400 Enlarged prostate without lower urinary tract symptoms: Secondary | ICD-10-CM | POA: Diagnosis not present

## 2021-03-01 DIAGNOSIS — G8191 Hemiplegia, unspecified affecting right dominant side: Secondary | ICD-10-CM | POA: Diagnosis not present

## 2021-03-01 DIAGNOSIS — I10 Essential (primary) hypertension: Secondary | ICD-10-CM | POA: Diagnosis not present

## 2021-03-01 DIAGNOSIS — N401 Enlarged prostate with lower urinary tract symptoms: Secondary | ICD-10-CM

## 2021-03-01 DIAGNOSIS — M109 Gout, unspecified: Secondary | ICD-10-CM | POA: Diagnosis not present

## 2021-03-01 MED ORDER — FINASTERIDE 5 MG PO TABS: 5.0000 mg | ORAL_TABLET | Freq: Every day | ORAL | 3 refills | Status: DC

## 2021-03-01 NOTE — Progress Notes (Signed)
This service is provided via telemedicine   No vital signs collected/recorded due to the encounter was a telemedicine visit.     Patient consents to a telephone visit: Pharmacy, medications, allergies, were verified    Names of all persons participating in the telemedicine service and their role in the encounter:  Bronsyn Shappell. CMA

## 2021-03-02 ENCOUNTER — Other Ambulatory Visit: Payer: Self-pay

## 2021-03-06 ENCOUNTER — Ambulatory Visit: Payer: Self-pay | Admitting: Urology

## 2021-03-12 DIAGNOSIS — M109 Gout, unspecified: Secondary | ICD-10-CM | POA: Diagnosis not present

## 2021-03-12 DIAGNOSIS — Z7902 Long term (current) use of antithrombotics/antiplatelets: Secondary | ICD-10-CM | POA: Diagnosis not present

## 2021-03-12 DIAGNOSIS — Z8572 Personal history of non-Hodgkin lymphomas: Secondary | ICD-10-CM | POA: Diagnosis not present

## 2021-03-12 DIAGNOSIS — E785 Hyperlipidemia, unspecified: Secondary | ICD-10-CM | POA: Diagnosis not present

## 2021-03-12 DIAGNOSIS — I10 Essential (primary) hypertension: Secondary | ICD-10-CM | POA: Diagnosis not present

## 2021-03-12 DIAGNOSIS — E119 Type 2 diabetes mellitus without complications: Secondary | ICD-10-CM | POA: Diagnosis not present

## 2021-03-12 DIAGNOSIS — Z6841 Body Mass Index (BMI) 40.0 and over, adult: Secondary | ICD-10-CM | POA: Diagnosis not present

## 2021-03-12 DIAGNOSIS — Z7984 Long term (current) use of oral hypoglycemic drugs: Secondary | ICD-10-CM | POA: Diagnosis not present

## 2021-03-12 DIAGNOSIS — Z9181 History of falling: Secondary | ICD-10-CM | POA: Diagnosis not present

## 2021-03-12 DIAGNOSIS — D472 Monoclonal gammopathy: Secondary | ICD-10-CM | POA: Diagnosis not present

## 2021-03-12 DIAGNOSIS — I69351 Hemiplegia and hemiparesis following cerebral infarction affecting right dominant side: Secondary | ICD-10-CM | POA: Diagnosis not present

## 2021-03-12 DIAGNOSIS — K5900 Constipation, unspecified: Secondary | ICD-10-CM | POA: Diagnosis not present

## 2021-03-12 DIAGNOSIS — K7469 Other cirrhosis of liver: Secondary | ICD-10-CM | POA: Diagnosis not present

## 2021-03-12 DIAGNOSIS — Z7982 Long term (current) use of aspirin: Secondary | ICD-10-CM | POA: Diagnosis not present

## 2021-03-12 DIAGNOSIS — Z87891 Personal history of nicotine dependence: Secondary | ICD-10-CM | POA: Diagnosis not present

## 2021-03-12 DIAGNOSIS — K219 Gastro-esophageal reflux disease without esophagitis: Secondary | ICD-10-CM | POA: Diagnosis not present

## 2021-03-12 DIAGNOSIS — N400 Enlarged prostate without lower urinary tract symptoms: Secondary | ICD-10-CM | POA: Diagnosis not present

## 2021-03-12 DIAGNOSIS — G459 Transient cerebral ischemic attack, unspecified: Secondary | ICD-10-CM | POA: Diagnosis not present

## 2021-03-13 ENCOUNTER — Telehealth: Payer: Self-pay | Admitting: Oncology

## 2021-03-13 DIAGNOSIS — I69351 Hemiplegia and hemiparesis following cerebral infarction affecting right dominant side: Secondary | ICD-10-CM | POA: Diagnosis not present

## 2021-03-13 DIAGNOSIS — M109 Gout, unspecified: Secondary | ICD-10-CM | POA: Diagnosis not present

## 2021-03-13 DIAGNOSIS — N400 Enlarged prostate without lower urinary tract symptoms: Secondary | ICD-10-CM | POA: Diagnosis not present

## 2021-03-13 DIAGNOSIS — E119 Type 2 diabetes mellitus without complications: Secondary | ICD-10-CM | POA: Diagnosis not present

## 2021-03-13 DIAGNOSIS — I10 Essential (primary) hypertension: Secondary | ICD-10-CM | POA: Diagnosis not present

## 2021-03-13 DIAGNOSIS — E785 Hyperlipidemia, unspecified: Secondary | ICD-10-CM | POA: Diagnosis not present

## 2021-03-13 NOTE — Telephone Encounter (Signed)
Patients wife called to cancel his 6/20 appointment as he has had a second stroke and she is unable to get him in the car.  She will call back to reschedule.

## 2021-03-15 ENCOUNTER — Inpatient Hospital Stay: Payer: Medicare Other | Admitting: Family Medicine

## 2021-03-15 ENCOUNTER — Telehealth (INDEPENDENT_AMBULATORY_CARE_PROVIDER_SITE_OTHER): Payer: Medicare Other | Admitting: Family Medicine

## 2021-03-15 ENCOUNTER — Encounter: Payer: Self-pay | Admitting: Family Medicine

## 2021-03-15 DIAGNOSIS — M109 Gout, unspecified: Secondary | ICD-10-CM | POA: Diagnosis not present

## 2021-03-15 DIAGNOSIS — E119 Type 2 diabetes mellitus without complications: Secondary | ICD-10-CM | POA: Diagnosis not present

## 2021-03-15 DIAGNOSIS — R531 Weakness: Secondary | ICD-10-CM

## 2021-03-15 DIAGNOSIS — I69351 Hemiplegia and hemiparesis following cerebral infarction affecting right dominant side: Secondary | ICD-10-CM | POA: Diagnosis not present

## 2021-03-15 DIAGNOSIS — E785 Hyperlipidemia, unspecified: Secondary | ICD-10-CM

## 2021-03-15 DIAGNOSIS — I639 Cerebral infarction, unspecified: Secondary | ICD-10-CM | POA: Diagnosis not present

## 2021-03-15 DIAGNOSIS — C8333 Diffuse large B-cell lymphoma, intra-abdominal lymph nodes: Secondary | ICD-10-CM | POA: Diagnosis not present

## 2021-03-15 DIAGNOSIS — I10 Essential (primary) hypertension: Secondary | ICD-10-CM | POA: Diagnosis not present

## 2021-03-15 DIAGNOSIS — E1169 Type 2 diabetes mellitus with other specified complication: Secondary | ICD-10-CM | POA: Diagnosis not present

## 2021-03-15 DIAGNOSIS — N400 Enlarged prostate without lower urinary tract symptoms: Secondary | ICD-10-CM | POA: Diagnosis not present

## 2021-03-15 NOTE — Progress Notes (Signed)
MyChart Video Visit    Virtual Visit via Video Note   This visit type was conducted due to national recommendations for restrictions regarding the COVID-19 Pandemic (e.g. social distancing) in an effort to limit this patient's exposure and mitigate transmission in our community. This patient is at least at moderate risk for complications without adequate follow up. This format is felt to be most appropriate for this patient at this time. Physical exam was limited by quality of the video and audio technology used for the visit.   Patient location:  Provider location: Office This was a telephone visit not a virtual visit.  Patient was unable to connect on the computer. I discussed the limitations of evaluation and management by telemedicine and the availability of in person appointments. The patient expressed understanding and agreed to proceed.  Patient: Peter Ryan   DOB: 01-23-1951   70 y.o. Male  MRN: [id number] Visit Date: 03/15/2021  Today's healthcare provider: Wilhemena Durie, MD   Chief Complaint  Patient presents with   Follow-up   Subjective    HPI  Patient was admitted and discharged 3 months ago with diagnosis of ongoing right-sided weakness with TIA with old stroke and right hemiparesis.  Patient went to rehab and is now back home. He continues to have difficulty and is unable to ambulate and has trouble even transferring due to weakness on the right side. He and his wife are doing fairly well with his care.  He states it is very difficult to get to the office. Follow up for CVA  The patient was admitted to [facility] rehab on 11/30/2020. He was then transferred to [facility] on 12/04/2020. Patient reports that he was discharged home on 03/09/2021.  Changes made at last visit include stopping glimepiride 2mg  and starting on Farxiga 5mg  daily. He also has started on Plavix 75mg  daily.   He reports good compliance with treatment. He feels that condition is Improved. He is  not having side effects.     Medications: Outpatient Medications Prior to Visit  Medication Sig   acetaminophen (TYLENOL) 325 MG tablet Take 2 tablets (650 mg total) by mouth every 4 (four) hours as needed for mild pain (or temp > 37.5 C (99.5 F)).   allopurinol (ZYLOPRIM) 300 MG tablet Take 1 tablet (300 mg total) by mouth daily.   amLODipine (NORVASC) 10 MG tablet Take 1 tablet (10 mg total) by mouth daily.   aspirin EC 325 MG EC tablet Take 1 tablet (325 mg total) by mouth daily.   Cholecalciferol (VITAMIN D3) 25 MCG (1000 UT) CAPS Take 1 capsule (1,000 Units total) by mouth daily.   cloNIDine (CATAPRES) 0.1 MG tablet Take 1 tablet (0.1 mg total) by mouth 2 (two) times daily.   finasteride (PROSCAR) 5 MG tablet Take 1 tablet (5 mg total) by mouth daily.   glimepiride (AMARYL) 2 MG tablet TAKE 1 TABLET BY MOUTH DAILY WITH BREAKFAST (Patient not taking: Reported on 03/15/2021)   hydrocortisone 2.5 % lotion Apply topically as directed. Apply to affected areas as needed up to three times weekly.   ketoconazole (NIZORAL) 2 % shampoo Apply 1 application topically as directed. Apply small amount every other day to affected areas. Let sit several minutes before rinsing.   losartan (COZAAR) 100 MG tablet TAKE ONE TABLET EVERY DAY BY MOUTH (Patient taking differently: Take 100 mg by mouth daily. TAKE ONE TABLET EVERY DAY BY MOUTH)   polyethylene glycol (MIRALAX / GLYCOLAX) 17 g packet Take  17 g by mouth daily.   vitamin B-12 (CYANOCOBALAMIN) 1000 MCG tablet Take 1 tablet (1,000 mcg total) by mouth daily.   Facility-Administered Medications Prior to Visit  Medication Dose Route Frequency Provider   sodium chloride flush (NS) 0.9 % injection 10 mL  10 mL Intravenous PRN Sindy Guadeloupe, MD    Review of Systems     Objective    There were no vitals taken for this visit.         Assessment & Plan     1. Right sided weakness right-sided weakness from old CVA.  Patient trying to work on  physical and occupational therapy at home.  He was treated at rehab after his hospitalization 3 months ago.  2. Left pontine cerebrovascular accident [facility]) All risk factors treated  3. Hyperlipidemia associated with type 2 diabetes mellitus ([place]) On treatment aggressively I will see him back in the office later this summer and obtain lab work then. 4. Morbid obesity ([place]) Advised patient any weight loss will help him with possible movement  5. 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     I discussed the assessment and treatment plan with the patient. The patient was provided an opportunity to ask questions and all were answered. The patient agreed with the plan and demonstrated an understanding of the instructions.   The patient was advised to call back or seek an in-person evaluation if the symptoms worsen or if the condition fails to improve as anticipated.  I provided 12 minutes of non-face-to-face time during this encounter.  I, Wilhemena Durie, MD, have reviewed all documentation for this visit. The documentation on 03/17/21 for the exam, diagnosis, procedures, and orders are all accurate and complete.   Trew Sunde Cranford Mon, MD [facility] [phone] (phone) [phone] (fax)  [place]

## 2021-03-16 DIAGNOSIS — N400 Enlarged prostate without lower urinary tract symptoms: Secondary | ICD-10-CM | POA: Diagnosis not present

## 2021-03-16 DIAGNOSIS — E119 Type 2 diabetes mellitus without complications: Secondary | ICD-10-CM | POA: Diagnosis not present

## 2021-03-16 DIAGNOSIS — M109 Gout, unspecified: Secondary | ICD-10-CM | POA: Diagnosis not present

## 2021-03-16 DIAGNOSIS — E785 Hyperlipidemia, unspecified: Secondary | ICD-10-CM | POA: Diagnosis not present

## 2021-03-16 DIAGNOSIS — I69351 Hemiplegia and hemiparesis following cerebral infarction affecting right dominant side: Secondary | ICD-10-CM | POA: Diagnosis not present

## 2021-03-16 DIAGNOSIS — I10 Essential (primary) hypertension: Secondary | ICD-10-CM | POA: Diagnosis not present

## 2021-03-19 ENCOUNTER — Inpatient Hospital Stay: Payer: Medicare Other | Admitting: Oncology

## 2021-03-19 ENCOUNTER — Telehealth: Payer: Self-pay

## 2021-03-19 ENCOUNTER — Inpatient Hospital Stay: Payer: Medicare Other

## 2021-03-19 DIAGNOSIS — M109 Gout, unspecified: Secondary | ICD-10-CM | POA: Diagnosis not present

## 2021-03-19 DIAGNOSIS — E785 Hyperlipidemia, unspecified: Secondary | ICD-10-CM | POA: Diagnosis not present

## 2021-03-19 DIAGNOSIS — I69351 Hemiplegia and hemiparesis following cerebral infarction affecting right dominant side: Secondary | ICD-10-CM | POA: Diagnosis not present

## 2021-03-19 DIAGNOSIS — E119 Type 2 diabetes mellitus without complications: Secondary | ICD-10-CM

## 2021-03-19 DIAGNOSIS — I10 Essential (primary) hypertension: Secondary | ICD-10-CM | POA: Diagnosis not present

## 2021-03-19 DIAGNOSIS — N400 Enlarged prostate without lower urinary tract symptoms: Secondary | ICD-10-CM | POA: Diagnosis not present

## 2021-03-19 NOTE — Telephone Encounter (Signed)
Copied from [place] [phone]. Topic: General - Other >> Mar 19, 2021  8:35 AM Loma [place] wrote: Reason for CRM: pls have Dr Darnell Level nurse FU with pt wife at [phone] [name]) Pt had a visit with Dr Darnell Level for HFU on 6/16/ has been in hospital for 3 months and  was in rehab. Now that at home Dr Rosanna Randy was to be submitting his Wilder Glade that he was started on as refill and also patches neither on current med list. Must have Wilder Glade asap send to [facility]

## 2021-03-19 NOTE — Telephone Encounter (Signed)
Please advise 

## 2021-03-20 DIAGNOSIS — I69351 Hemiplegia and hemiparesis following cerebral infarction affecting right dominant side: Secondary | ICD-10-CM | POA: Diagnosis not present

## 2021-03-20 DIAGNOSIS — E119 Type 2 diabetes mellitus without complications: Secondary | ICD-10-CM | POA: Diagnosis not present

## 2021-03-20 DIAGNOSIS — M109 Gout, unspecified: Secondary | ICD-10-CM | POA: Diagnosis not present

## 2021-03-20 DIAGNOSIS — I10 Essential (primary) hypertension: Secondary | ICD-10-CM | POA: Diagnosis not present

## 2021-03-20 DIAGNOSIS — E785 Hyperlipidemia, unspecified: Secondary | ICD-10-CM | POA: Diagnosis not present

## 2021-03-20 DIAGNOSIS — N400 Enlarged prostate without lower urinary tract symptoms: Secondary | ICD-10-CM | POA: Diagnosis not present

## 2021-03-20 MED ORDER — DAPAGLIFLOZIN PROPANEDIOL 10 MG PO TABS: 10.0000 mg | ORAL_TABLET | Freq: Every day | ORAL | 3 refills | Status: DC

## 2021-03-20 NOTE — Telephone Encounter (Signed)
Please advise clonidine 0.01 mg patch.

## 2021-03-20 NOTE — Telephone Encounter (Signed)
LMOVM for Peter Ryan to return call. Need to know which patch she is talking about? Okay for PEC to speck to patient.

## 2021-03-20 NOTE — Telephone Encounter (Signed)
Need to know Farxiga strength and what the patch is patient is to be started on? Please advise?

## 2021-03-20 NOTE — Telephone Encounter (Signed)
Patients wife called in with name of patch medication. It is clonidine 0.01 mg 24hr

## 2021-03-20 NOTE — Telephone Encounter (Signed)
Patients wife called in stating medication, dapagliflozin propanediol (FARXIGA) 10 MG TABS tablet, isn't covered under insuarance without prior authorization. Please call back

## 2021-03-20 NOTE — Addendum Note (Signed)
Addended by: Julieta Bellini on: 03/20/2021 01:49 PM   Modules accepted: Orders

## 2021-03-20 NOTE — Telephone Encounter (Signed)
Peter Ryan was sent to pharmacy.

## 2021-03-20 NOTE — Telephone Encounter (Signed)
Pt wife called in stating that the Pharmacy has still not received the medication refill. And to see about getting it sent over. Please advise.

## 2021-03-21 DIAGNOSIS — E119 Type 2 diabetes mellitus without complications: Secondary | ICD-10-CM | POA: Diagnosis not present

## 2021-03-21 DIAGNOSIS — I69351 Hemiplegia and hemiparesis following cerebral infarction affecting right dominant side: Secondary | ICD-10-CM | POA: Diagnosis not present

## 2021-03-21 DIAGNOSIS — M109 Gout, unspecified: Secondary | ICD-10-CM | POA: Diagnosis not present

## 2021-03-21 DIAGNOSIS — I10 Essential (primary) hypertension: Secondary | ICD-10-CM | POA: Diagnosis not present

## 2021-03-21 DIAGNOSIS — E785 Hyperlipidemia, unspecified: Secondary | ICD-10-CM | POA: Diagnosis not present

## 2021-03-21 DIAGNOSIS — N400 Enlarged prostate without lower urinary tract symptoms: Secondary | ICD-10-CM | POA: Diagnosis not present

## 2021-03-21 MED ORDER — CLONIDINE 0.1 MG/24HR TD PTWK: 0.1000 mg | MEDICATED_PATCH | TRANSDERMAL | 12 refills | Status: DC

## 2021-03-21 NOTE — Telephone Encounter (Signed)
Patch rx sent to pharmacy.

## 2021-03-21 NOTE — Addendum Note (Signed)
Addended by: Julieta Bellini on: 03/21/2021 04:12 PM   Modules accepted: Orders

## 2021-03-22 DIAGNOSIS — E785 Hyperlipidemia, unspecified: Secondary | ICD-10-CM | POA: Diagnosis not present

## 2021-03-22 DIAGNOSIS — I69351 Hemiplegia and hemiparesis following cerebral infarction affecting right dominant side: Secondary | ICD-10-CM | POA: Diagnosis not present

## 2021-03-22 DIAGNOSIS — N400 Enlarged prostate without lower urinary tract symptoms: Secondary | ICD-10-CM | POA: Diagnosis not present

## 2021-03-22 DIAGNOSIS — I10 Essential (primary) hypertension: Secondary | ICD-10-CM | POA: Diagnosis not present

## 2021-03-22 DIAGNOSIS — E119 Type 2 diabetes mellitus without complications: Secondary | ICD-10-CM | POA: Diagnosis not present

## 2021-03-22 DIAGNOSIS — M109 Gout, unspecified: Secondary | ICD-10-CM | POA: Diagnosis not present

## 2021-03-23 DIAGNOSIS — E119 Type 2 diabetes mellitus without complications: Secondary | ICD-10-CM | POA: Diagnosis not present

## 2021-03-23 DIAGNOSIS — I69351 Hemiplegia and hemiparesis following cerebral infarction affecting right dominant side: Secondary | ICD-10-CM | POA: Diagnosis not present

## 2021-03-23 DIAGNOSIS — E785 Hyperlipidemia, unspecified: Secondary | ICD-10-CM | POA: Diagnosis not present

## 2021-03-23 DIAGNOSIS — I10 Essential (primary) hypertension: Secondary | ICD-10-CM | POA: Diagnosis not present

## 2021-03-23 DIAGNOSIS — N400 Enlarged prostate without lower urinary tract symptoms: Secondary | ICD-10-CM | POA: Diagnosis not present

## 2021-03-23 DIAGNOSIS — M109 Gout, unspecified: Secondary | ICD-10-CM | POA: Diagnosis not present

## 2021-03-26 DIAGNOSIS — N400 Enlarged prostate without lower urinary tract symptoms: Secondary | ICD-10-CM | POA: Diagnosis not present

## 2021-03-26 DIAGNOSIS — I10 Essential (primary) hypertension: Secondary | ICD-10-CM | POA: Diagnosis not present

## 2021-03-26 DIAGNOSIS — E119 Type 2 diabetes mellitus without complications: Secondary | ICD-10-CM | POA: Diagnosis not present

## 2021-03-26 DIAGNOSIS — E785 Hyperlipidemia, unspecified: Secondary | ICD-10-CM | POA: Diagnosis not present

## 2021-03-26 DIAGNOSIS — M109 Gout, unspecified: Secondary | ICD-10-CM | POA: Diagnosis not present

## 2021-03-26 DIAGNOSIS — I69351 Hemiplegia and hemiparesis following cerebral infarction affecting right dominant side: Secondary | ICD-10-CM | POA: Diagnosis not present

## 2021-03-27 DIAGNOSIS — E785 Hyperlipidemia, unspecified: Secondary | ICD-10-CM | POA: Diagnosis not present

## 2021-03-27 DIAGNOSIS — I10 Essential (primary) hypertension: Secondary | ICD-10-CM | POA: Diagnosis not present

## 2021-03-27 DIAGNOSIS — M109 Gout, unspecified: Secondary | ICD-10-CM | POA: Diagnosis not present

## 2021-03-27 DIAGNOSIS — I69351 Hemiplegia and hemiparesis following cerebral infarction affecting right dominant side: Secondary | ICD-10-CM | POA: Diagnosis not present

## 2021-03-27 DIAGNOSIS — N400 Enlarged prostate without lower urinary tract symptoms: Secondary | ICD-10-CM | POA: Diagnosis not present

## 2021-03-27 DIAGNOSIS — E119 Type 2 diabetes mellitus without complications: Secondary | ICD-10-CM | POA: Diagnosis not present

## 2021-03-28 DIAGNOSIS — N400 Enlarged prostate without lower urinary tract symptoms: Secondary | ICD-10-CM | POA: Diagnosis not present

## 2021-03-28 DIAGNOSIS — I69351 Hemiplegia and hemiparesis following cerebral infarction affecting right dominant side: Secondary | ICD-10-CM | POA: Diagnosis not present

## 2021-03-28 DIAGNOSIS — M109 Gout, unspecified: Secondary | ICD-10-CM | POA: Diagnosis not present

## 2021-03-28 DIAGNOSIS — E119 Type 2 diabetes mellitus without complications: Secondary | ICD-10-CM | POA: Diagnosis not present

## 2021-03-28 DIAGNOSIS — I10 Essential (primary) hypertension: Secondary | ICD-10-CM | POA: Diagnosis not present

## 2021-03-28 DIAGNOSIS — E785 Hyperlipidemia, unspecified: Secondary | ICD-10-CM | POA: Diagnosis not present

## 2021-03-29 ENCOUNTER — Telehealth: Payer: Self-pay

## 2021-03-29 DIAGNOSIS — M109 Gout, unspecified: Secondary | ICD-10-CM | POA: Diagnosis not present

## 2021-03-29 DIAGNOSIS — I10 Essential (primary) hypertension: Secondary | ICD-10-CM | POA: Diagnosis not present

## 2021-03-29 DIAGNOSIS — E119 Type 2 diabetes mellitus without complications: Secondary | ICD-10-CM | POA: Diagnosis not present

## 2021-03-29 DIAGNOSIS — I69351 Hemiplegia and hemiparesis following cerebral infarction affecting right dominant side: Secondary | ICD-10-CM | POA: Diagnosis not present

## 2021-03-29 DIAGNOSIS — N400 Enlarged prostate without lower urinary tract symptoms: Secondary | ICD-10-CM | POA: Diagnosis not present

## 2021-03-29 DIAGNOSIS — E785 Hyperlipidemia, unspecified: Secondary | ICD-10-CM | POA: Diagnosis not present

## 2021-03-29 NOTE — Telephone Encounter (Signed)
Noted, thanks!

## 2021-03-29 NOTE — Telephone Encounter (Signed)
FYI-Pa done today for [place] Outcome Approvedtoday ELFYBO:[id number];NIDPOE:[id number];Review Type:Prior Auth;Coverage Start Date:02/27/2021;Coverage End Date:03/29/2022; Drug Farxiga 10MG  tablets

## 2021-03-30 DIAGNOSIS — E119 Type 2 diabetes mellitus without complications: Secondary | ICD-10-CM | POA: Diagnosis not present

## 2021-03-30 DIAGNOSIS — I69351 Hemiplegia and hemiparesis following cerebral infarction affecting right dominant side: Secondary | ICD-10-CM | POA: Diagnosis not present

## 2021-03-30 DIAGNOSIS — E785 Hyperlipidemia, unspecified: Secondary | ICD-10-CM | POA: Diagnosis not present

## 2021-03-30 DIAGNOSIS — I10 Essential (primary) hypertension: Secondary | ICD-10-CM | POA: Diagnosis not present

## 2021-03-30 DIAGNOSIS — M109 Gout, unspecified: Secondary | ICD-10-CM | POA: Diagnosis not present

## 2021-03-30 DIAGNOSIS — N400 Enlarged prostate without lower urinary tract symptoms: Secondary | ICD-10-CM | POA: Diagnosis not present

## 2021-04-01 ENCOUNTER — Other Ambulatory Visit: Payer: Self-pay | Admitting: Family Medicine

## 2021-04-01 NOTE — Telephone Encounter (Signed)
dc'd 03/01/21 "pt preference" Daryel Gerald CMA

## 2021-04-03 ENCOUNTER — Other Ambulatory Visit: Payer: Self-pay | Admitting: Family Medicine

## 2021-04-03 DIAGNOSIS — N400 Enlarged prostate without lower urinary tract symptoms: Secondary | ICD-10-CM | POA: Diagnosis not present

## 2021-04-03 DIAGNOSIS — I69351 Hemiplegia and hemiparesis following cerebral infarction affecting right dominant side: Secondary | ICD-10-CM | POA: Diagnosis not present

## 2021-04-03 DIAGNOSIS — E785 Hyperlipidemia, unspecified: Secondary | ICD-10-CM | POA: Diagnosis not present

## 2021-04-03 DIAGNOSIS — M109 Gout, unspecified: Secondary | ICD-10-CM | POA: Diagnosis not present

## 2021-04-03 DIAGNOSIS — E119 Type 2 diabetes mellitus without complications: Secondary | ICD-10-CM | POA: Diagnosis not present

## 2021-04-03 DIAGNOSIS — I10 Essential (primary) hypertension: Secondary | ICD-10-CM | POA: Diagnosis not present

## 2021-04-03 NOTE — Telephone Encounter (Signed)
Requested medication is not on list Review for script

## 2021-04-03 NOTE — Telephone Encounter (Signed)
Copied from [place] [id number]. Topic: Quick Communication - Rx Refill/Question >> Apr 03, 2021  2:25 PM Tessa Lerner A wrote: Medication: atorvastatin (LIPITOR) tablet 80 mg     Has the patient contacted their pharmacy? Yes.   (Agent: If no, request that the patient contact the pharmacy for the refill.) (Agent: If yes, when and what did the pharmacy advise?)  Preferred Pharmacy (with phone number or street name): [place], [place] - [place]  Phone:  [phone] Fax:  [phone]  Agent: Please be advised that RX refills may take up to 3 business days. We ask that you follow-up with your pharmacy.

## 2021-04-04 DIAGNOSIS — N400 Enlarged prostate without lower urinary tract symptoms: Secondary | ICD-10-CM | POA: Diagnosis not present

## 2021-04-04 DIAGNOSIS — E785 Hyperlipidemia, unspecified: Secondary | ICD-10-CM | POA: Diagnosis not present

## 2021-04-04 DIAGNOSIS — I69351 Hemiplegia and hemiparesis following cerebral infarction affecting right dominant side: Secondary | ICD-10-CM | POA: Diagnosis not present

## 2021-04-04 DIAGNOSIS — M109 Gout, unspecified: Secondary | ICD-10-CM | POA: Diagnosis not present

## 2021-04-04 DIAGNOSIS — E119 Type 2 diabetes mellitus without complications: Secondary | ICD-10-CM | POA: Diagnosis not present

## 2021-04-04 DIAGNOSIS — I10 Essential (primary) hypertension: Secondary | ICD-10-CM | POA: Diagnosis not present

## 2021-04-05 MED ORDER — ATORVASTATIN CALCIUM 80 MG PO TABS: 80.0000 mg | ORAL_TABLET | Freq: Every day | ORAL | 3 refills | Status: DC

## 2021-04-09 DIAGNOSIS — N400 Enlarged prostate without lower urinary tract symptoms: Secondary | ICD-10-CM | POA: Diagnosis not present

## 2021-04-09 DIAGNOSIS — I69351 Hemiplegia and hemiparesis following cerebral infarction affecting right dominant side: Secondary | ICD-10-CM | POA: Diagnosis not present

## 2021-04-09 DIAGNOSIS — E785 Hyperlipidemia, unspecified: Secondary | ICD-10-CM | POA: Diagnosis not present

## 2021-04-09 DIAGNOSIS — I10 Essential (primary) hypertension: Secondary | ICD-10-CM | POA: Diagnosis not present

## 2021-04-09 DIAGNOSIS — E119 Type 2 diabetes mellitus without complications: Secondary | ICD-10-CM | POA: Diagnosis not present

## 2021-04-09 DIAGNOSIS — M109 Gout, unspecified: Secondary | ICD-10-CM | POA: Diagnosis not present

## 2021-04-10 DIAGNOSIS — N400 Enlarged prostate without lower urinary tract symptoms: Secondary | ICD-10-CM | POA: Diagnosis not present

## 2021-04-10 DIAGNOSIS — E785 Hyperlipidemia, unspecified: Secondary | ICD-10-CM | POA: Diagnosis not present

## 2021-04-10 DIAGNOSIS — I10 Essential (primary) hypertension: Secondary | ICD-10-CM | POA: Diagnosis not present

## 2021-04-10 DIAGNOSIS — M109 Gout, unspecified: Secondary | ICD-10-CM | POA: Diagnosis not present

## 2021-04-10 DIAGNOSIS — I69351 Hemiplegia and hemiparesis following cerebral infarction affecting right dominant side: Secondary | ICD-10-CM | POA: Diagnosis not present

## 2021-04-10 DIAGNOSIS — E119 Type 2 diabetes mellitus without complications: Secondary | ICD-10-CM | POA: Diagnosis not present

## 2021-04-11 DIAGNOSIS — Z6841 Body Mass Index (BMI) 40.0 and over, adult: Secondary | ICD-10-CM | POA: Diagnosis not present

## 2021-04-11 DIAGNOSIS — E119 Type 2 diabetes mellitus without complications: Secondary | ICD-10-CM | POA: Diagnosis not present

## 2021-04-11 DIAGNOSIS — K5900 Constipation, unspecified: Secondary | ICD-10-CM | POA: Diagnosis not present

## 2021-04-11 DIAGNOSIS — N400 Enlarged prostate without lower urinary tract symptoms: Secondary | ICD-10-CM | POA: Diagnosis not present

## 2021-04-11 DIAGNOSIS — Z9181 History of falling: Secondary | ICD-10-CM | POA: Diagnosis not present

## 2021-04-11 DIAGNOSIS — E785 Hyperlipidemia, unspecified: Secondary | ICD-10-CM | POA: Diagnosis not present

## 2021-04-11 DIAGNOSIS — K219 Gastro-esophageal reflux disease without esophagitis: Secondary | ICD-10-CM | POA: Diagnosis not present

## 2021-04-11 DIAGNOSIS — M109 Gout, unspecified: Secondary | ICD-10-CM | POA: Diagnosis not present

## 2021-04-11 DIAGNOSIS — K7469 Other cirrhosis of liver: Secondary | ICD-10-CM | POA: Diagnosis not present

## 2021-04-11 DIAGNOSIS — I10 Essential (primary) hypertension: Secondary | ICD-10-CM | POA: Diagnosis not present

## 2021-04-11 DIAGNOSIS — Z7902 Long term (current) use of antithrombotics/antiplatelets: Secondary | ICD-10-CM | POA: Diagnosis not present

## 2021-04-11 DIAGNOSIS — I69351 Hemiplegia and hemiparesis following cerebral infarction affecting right dominant side: Secondary | ICD-10-CM | POA: Diagnosis not present

## 2021-04-11 DIAGNOSIS — Z8572 Personal history of non-Hodgkin lymphomas: Secondary | ICD-10-CM | POA: Diagnosis not present

## 2021-04-11 DIAGNOSIS — D472 Monoclonal gammopathy: Secondary | ICD-10-CM | POA: Diagnosis not present

## 2021-04-11 DIAGNOSIS — Z7982 Long term (current) use of aspirin: Secondary | ICD-10-CM | POA: Diagnosis not present

## 2021-04-11 DIAGNOSIS — Z87891 Personal history of nicotine dependence: Secondary | ICD-10-CM | POA: Diagnosis not present

## 2021-04-11 DIAGNOSIS — Z7984 Long term (current) use of oral hypoglycemic drugs: Secondary | ICD-10-CM | POA: Diagnosis not present

## 2021-04-11 DIAGNOSIS — G459 Transient cerebral ischemic attack, unspecified: Secondary | ICD-10-CM | POA: Diagnosis not present

## 2021-04-12 DIAGNOSIS — N400 Enlarged prostate without lower urinary tract symptoms: Secondary | ICD-10-CM | POA: Diagnosis not present

## 2021-04-12 DIAGNOSIS — I69351 Hemiplegia and hemiparesis following cerebral infarction affecting right dominant side: Secondary | ICD-10-CM | POA: Diagnosis not present

## 2021-04-12 DIAGNOSIS — M109 Gout, unspecified: Secondary | ICD-10-CM | POA: Diagnosis not present

## 2021-04-12 DIAGNOSIS — E119 Type 2 diabetes mellitus without complications: Secondary | ICD-10-CM | POA: Diagnosis not present

## 2021-04-12 DIAGNOSIS — I10 Essential (primary) hypertension: Secondary | ICD-10-CM | POA: Diagnosis not present

## 2021-04-12 DIAGNOSIS — E785 Hyperlipidemia, unspecified: Secondary | ICD-10-CM | POA: Diagnosis not present

## 2021-04-16 DIAGNOSIS — E119 Type 2 diabetes mellitus without complications: Secondary | ICD-10-CM | POA: Diagnosis not present

## 2021-04-16 DIAGNOSIS — E785 Hyperlipidemia, unspecified: Secondary | ICD-10-CM | POA: Diagnosis not present

## 2021-04-16 DIAGNOSIS — M109 Gout, unspecified: Secondary | ICD-10-CM | POA: Diagnosis not present

## 2021-04-16 DIAGNOSIS — I10 Essential (primary) hypertension: Secondary | ICD-10-CM | POA: Diagnosis not present

## 2021-04-16 DIAGNOSIS — N400 Enlarged prostate without lower urinary tract symptoms: Secondary | ICD-10-CM | POA: Diagnosis not present

## 2021-04-16 DIAGNOSIS — I69351 Hemiplegia and hemiparesis following cerebral infarction affecting right dominant side: Secondary | ICD-10-CM | POA: Diagnosis not present

## 2021-04-17 ENCOUNTER — Telehealth: Payer: Self-pay | Admitting: Family Medicine

## 2021-04-17 DIAGNOSIS — I10 Essential (primary) hypertension: Secondary | ICD-10-CM | POA: Diagnosis not present

## 2021-04-17 DIAGNOSIS — E119 Type 2 diabetes mellitus without complications: Secondary | ICD-10-CM | POA: Diagnosis not present

## 2021-04-17 DIAGNOSIS — M109 Gout, unspecified: Secondary | ICD-10-CM | POA: Diagnosis not present

## 2021-04-17 DIAGNOSIS — N400 Enlarged prostate without lower urinary tract symptoms: Secondary | ICD-10-CM | POA: Diagnosis not present

## 2021-04-17 DIAGNOSIS — I69351 Hemiplegia and hemiparesis following cerebral infarction affecting right dominant side: Secondary | ICD-10-CM | POA: Diagnosis not present

## 2021-04-17 DIAGNOSIS — E785 Hyperlipidemia, unspecified: Secondary | ICD-10-CM | POA: Diagnosis not present

## 2021-04-17 NOTE — Telephone Encounter (Signed)
Home Health Verbal Orders - Caller/Agency: [facility] Callback Number: [phone] Requesting OT Frequency: 1x3

## 2021-04-17 NOTE — Telephone Encounter (Signed)
Please review. KW 

## 2021-04-17 NOTE — Telephone Encounter (Signed)
They will also need to add a home health aid once a week for three weeks

## 2021-04-17 NOTE — Telephone Encounter (Signed)
Please advise 

## 2021-04-19 DIAGNOSIS — M109 Gout, unspecified: Secondary | ICD-10-CM | POA: Diagnosis not present

## 2021-04-19 DIAGNOSIS — E785 Hyperlipidemia, unspecified: Secondary | ICD-10-CM | POA: Diagnosis not present

## 2021-04-19 DIAGNOSIS — N400 Enlarged prostate without lower urinary tract symptoms: Secondary | ICD-10-CM | POA: Diagnosis not present

## 2021-04-19 DIAGNOSIS — I10 Essential (primary) hypertension: Secondary | ICD-10-CM | POA: Diagnosis not present

## 2021-04-19 DIAGNOSIS — I69351 Hemiplegia and hemiparesis following cerebral infarction affecting right dominant side: Secondary | ICD-10-CM | POA: Diagnosis not present

## 2021-04-19 DIAGNOSIS — E119 Type 2 diabetes mellitus without complications: Secondary | ICD-10-CM | POA: Diagnosis not present

## 2021-04-27 ENCOUNTER — Ambulatory Visit: Payer: Self-pay | Admitting: *Deleted

## 2021-04-27 NOTE — Telephone Encounter (Signed)
FYI

## 2021-04-27 NOTE — Telephone Encounter (Signed)
Wife Peter Ryan called in concerned he is having the same symptoms he had in March 2022 when he had a stroke.   Vomiting with movement and weakness.  No other changes that she notices but very concerned "because this is how he felt when he had his stroke in March". I instructed her to call 911.   She was agreeable to this plan.   "I don't know if he will be agreeable but I'll tell him he needs to go".    See triage notes.  I sent my notes to [facility] for Dr. Alben Spittle information.

## 2021-04-27 NOTE — Telephone Encounter (Signed)
Sela Hua with [facility] is calling to report missed visit on 04/27/21. Just wanting to report. [phone]

## 2021-04-27 NOTE — Telephone Encounter (Signed)
Reason for Disposition  Sounds like a life-threatening emergency to the triager    Having same symptoms he had prior to his stroke in March 2022.  Vomiting with movement and weakness.  Answer Assessment - Initial Assessment Questions 1. VOMITING SEVERITY: "How many times have you vomited in the past 24 hours?"     - MILD:  1 - 2 times/day    - MODERATE: 3 - 5 times/day, decreased oral intake without significant weight loss or symptoms of dehydration    - SEVERE: 6 or more times/day, vomits everything or nearly everything, with significant weight loss, symptoms of dehydration      Wife calling in [place].    Husband is vomiting with movement.  He had a stroke in March.    He is weak.   This is the same symptoms he had when he had his stroke in March.   I got him in the recliner using a lift but he is very weak and vomits with any movement. 2. ONSET: "When did the vomiting begin?"      This morning I stopped the triage at this point and instructed her to call 911 since he had a stroke in March and he is having the same symptoms as then.   She was agreeable to calling 911.   "I don't know if he will want to do that".   "He spent a lot of time in the hospital with the last stroke".   I emphasized the importance of him going.   "I agree he should go".    "I'll go let him know, thank you so much for your help".  3. FLUIDS: "What fluids or food have you vomited up today?" "Have you been able to keep any fluids down?"     *No Answer* 4. ABDOMINAL PAIN: "Are your having any abdominal pain?" If yes : "How bad is it and what does it feel like?" (e.g., crampy, dull, intermittent, constant)      *No Answer* 5. DIARRHEA: "Is there any diarrhea?" If Yes, ask: "How many times today?"      *No Answer* 6. CONTACTS: "Is there anyone else in the family with the same symptoms?"      *No Answer* 7. CAUSE: "What do you think is causing your vomiting?"     *No Answer* 8. HYDRATION STATUS: "Any signs of  dehydration?" (e.g., dry mouth [not only dry lips], too weak to stand) "When did you last urinate?"     *No Answer* 9. OTHER SYMPTOMS: "Do you have any other symptoms?" (e.g., fever, headache, vertigo, vomiting blood or coffee grounds, recent head injury)     *No Answer* 10. PREGNANCY: "Is there any chance you are pregnant?" "When was your last menstrual period?"       *No Answer*  Protocols used: Vomiting-A-AH

## 2021-04-28 DIAGNOSIS — E785 Hyperlipidemia, unspecified: Secondary | ICD-10-CM | POA: Diagnosis not present

## 2021-04-28 DIAGNOSIS — M109 Gout, unspecified: Secondary | ICD-10-CM | POA: Diagnosis not present

## 2021-04-28 DIAGNOSIS — I10 Essential (primary) hypertension: Secondary | ICD-10-CM | POA: Diagnosis not present

## 2021-04-28 DIAGNOSIS — E119 Type 2 diabetes mellitus without complications: Secondary | ICD-10-CM | POA: Diagnosis not present

## 2021-04-28 DIAGNOSIS — I69351 Hemiplegia and hemiparesis following cerebral infarction affecting right dominant side: Secondary | ICD-10-CM | POA: Diagnosis not present

## 2021-04-28 DIAGNOSIS — N400 Enlarged prostate without lower urinary tract symptoms: Secondary | ICD-10-CM | POA: Diagnosis not present

## 2021-04-30 ENCOUNTER — Telehealth: Payer: Self-pay | Admitting: Family Medicine

## 2021-04-30 DIAGNOSIS — E119 Type 2 diabetes mellitus without complications: Secondary | ICD-10-CM | POA: Diagnosis not present

## 2021-04-30 DIAGNOSIS — N400 Enlarged prostate without lower urinary tract symptoms: Secondary | ICD-10-CM | POA: Diagnosis not present

## 2021-04-30 DIAGNOSIS — M109 Gout, unspecified: Secondary | ICD-10-CM | POA: Diagnosis not present

## 2021-04-30 DIAGNOSIS — I10 Essential (primary) hypertension: Secondary | ICD-10-CM | POA: Diagnosis not present

## 2021-04-30 DIAGNOSIS — E785 Hyperlipidemia, unspecified: Secondary | ICD-10-CM | POA: Diagnosis not present

## 2021-04-30 DIAGNOSIS — I69351 Hemiplegia and hemiparesis following cerebral infarction affecting right dominant side: Secondary | ICD-10-CM | POA: Diagnosis not present

## 2021-04-30 NOTE — Telephone Encounter (Signed)
Please review for Dr. Gilbert  Thanks,   -Avishai Reihl  

## 2021-04-30 NOTE — Telephone Encounter (Signed)
Doris with [facility] is calling for verbal orders for Home Health Aid & to continue OT. Frequency- 1 w 3 - 04/17/21 OT CB- [phone]

## 2021-04-30 NOTE — Telephone Encounter (Signed)
Doris did not need a verbal order.  She just needed our fax number to send written orders to be signed.   Thanks,   -Mickel Baas

## 2021-04-30 NOTE — Telephone Encounter (Signed)
Please review for Dr. Gilbert  Thanks,   -Srihaan Mastrangelo  

## 2021-04-30 NOTE — Telephone Encounter (Signed)
Soni calling from [facility] is calling to request an extension PT Frequency 1 time a week for 2 weeks starting May 01, 2001. Please advise CB- [phone]

## 2021-04-30 NOTE — Telephone Encounter (Signed)
That's fine

## 2021-05-01 NOTE — Telephone Encounter (Signed)
Soni advised.   Thanks,   -Azalyn Sliwa 

## 2021-05-02 DIAGNOSIS — E119 Type 2 diabetes mellitus without complications: Secondary | ICD-10-CM | POA: Diagnosis not present

## 2021-05-02 DIAGNOSIS — I10 Essential (primary) hypertension: Secondary | ICD-10-CM | POA: Diagnosis not present

## 2021-05-02 DIAGNOSIS — E785 Hyperlipidemia, unspecified: Secondary | ICD-10-CM | POA: Diagnosis not present

## 2021-05-02 DIAGNOSIS — M109 Gout, unspecified: Secondary | ICD-10-CM | POA: Diagnosis not present

## 2021-05-02 DIAGNOSIS — Z20822 Contact with and (suspected) exposure to covid-19: Secondary | ICD-10-CM | POA: Diagnosis not present

## 2021-05-02 DIAGNOSIS — N400 Enlarged prostate without lower urinary tract symptoms: Secondary | ICD-10-CM | POA: Diagnosis not present

## 2021-05-02 DIAGNOSIS — I69351 Hemiplegia and hemiparesis following cerebral infarction affecting right dominant side: Secondary | ICD-10-CM | POA: Diagnosis not present

## 2021-05-03 ENCOUNTER — Telehealth: Payer: Self-pay

## 2021-05-03 NOTE — Telephone Encounter (Signed)
Copied from [place] [phone]. Topic: General - Other >> May 03, 2021 10:29 AM Tessa Lerner A wrote: Reason for CRM: Deanna with [facility] would like to be contacted by Dr. Rosanna Randy or a member of clinical staff regarding previously submitted orders for a PT extension submitted to the practice  Deanna has been made aware that the orders were approved but would like confirmation of their approval by Dr. Rosanna Randy  Please contact further when possible

## 2021-05-07 NOTE — Telephone Encounter (Signed)
Please advise orders?

## 2021-05-09 DIAGNOSIS — I69351 Hemiplegia and hemiparesis following cerebral infarction affecting right dominant side: Secondary | ICD-10-CM | POA: Diagnosis not present

## 2021-05-09 DIAGNOSIS — M109 Gout, unspecified: Secondary | ICD-10-CM | POA: Diagnosis not present

## 2021-05-09 DIAGNOSIS — N400 Enlarged prostate without lower urinary tract symptoms: Secondary | ICD-10-CM | POA: Diagnosis not present

## 2021-05-09 DIAGNOSIS — E119 Type 2 diabetes mellitus without complications: Secondary | ICD-10-CM | POA: Diagnosis not present

## 2021-05-09 DIAGNOSIS — E785 Hyperlipidemia, unspecified: Secondary | ICD-10-CM | POA: Diagnosis not present

## 2021-05-09 DIAGNOSIS — I10 Essential (primary) hypertension: Secondary | ICD-10-CM | POA: Diagnosis not present

## 2021-05-10 ENCOUNTER — Telehealth: Payer: Self-pay | Admitting: Family Medicine

## 2021-05-10 DIAGNOSIS — I10 Essential (primary) hypertension: Secondary | ICD-10-CM | POA: Diagnosis not present

## 2021-05-10 DIAGNOSIS — N400 Enlarged prostate without lower urinary tract symptoms: Secondary | ICD-10-CM | POA: Diagnosis not present

## 2021-05-10 DIAGNOSIS — E785 Hyperlipidemia, unspecified: Secondary | ICD-10-CM | POA: Diagnosis not present

## 2021-05-10 DIAGNOSIS — I69351 Hemiplegia and hemiparesis following cerebral infarction affecting right dominant side: Secondary | ICD-10-CM | POA: Diagnosis not present

## 2021-05-10 DIAGNOSIS — M109 Gout, unspecified: Secondary | ICD-10-CM | POA: Diagnosis not present

## 2021-05-10 DIAGNOSIS — E119 Type 2 diabetes mellitus without complications: Secondary | ICD-10-CM | POA: Diagnosis not present

## 2021-05-10 NOTE — Telephone Encounter (Signed)
Please advise orders?

## 2021-05-10 NOTE — Telephone Encounter (Signed)
Home Health Verbal Orders - Caller/Agency: Leory Plowman [place] Number: [id number]  Requesting OT/PT/Skilled Nursing/Social Work/Speech Therapy:  OT   Frequency: 1 time a week for 4 weeks

## 2021-05-11 DIAGNOSIS — E785 Hyperlipidemia, unspecified: Secondary | ICD-10-CM | POA: Diagnosis not present

## 2021-05-11 DIAGNOSIS — I69351 Hemiplegia and hemiparesis following cerebral infarction affecting right dominant side: Secondary | ICD-10-CM | POA: Diagnosis not present

## 2021-05-11 DIAGNOSIS — Z7984 Long term (current) use of oral hypoglycemic drugs: Secondary | ICD-10-CM | POA: Diagnosis not present

## 2021-05-11 DIAGNOSIS — K219 Gastro-esophageal reflux disease without esophagitis: Secondary | ICD-10-CM | POA: Diagnosis not present

## 2021-05-11 DIAGNOSIS — D472 Monoclonal gammopathy: Secondary | ICD-10-CM | POA: Diagnosis not present

## 2021-05-11 DIAGNOSIS — Z993 Dependence on wheelchair: Secondary | ICD-10-CM | POA: Diagnosis not present

## 2021-05-11 DIAGNOSIS — Z6841 Body Mass Index (BMI) 40.0 and over, adult: Secondary | ICD-10-CM | POA: Diagnosis not present

## 2021-05-11 DIAGNOSIS — Z87891 Personal history of nicotine dependence: Secondary | ICD-10-CM | POA: Diagnosis not present

## 2021-05-11 DIAGNOSIS — M109 Gout, unspecified: Secondary | ICD-10-CM | POA: Diagnosis not present

## 2021-05-11 DIAGNOSIS — Z8572 Personal history of non-Hodgkin lymphomas: Secondary | ICD-10-CM | POA: Diagnosis not present

## 2021-05-11 DIAGNOSIS — K5900 Constipation, unspecified: Secondary | ICD-10-CM | POA: Diagnosis not present

## 2021-05-11 DIAGNOSIS — I10 Essential (primary) hypertension: Secondary | ICD-10-CM | POA: Diagnosis not present

## 2021-05-11 DIAGNOSIS — Z7982 Long term (current) use of aspirin: Secondary | ICD-10-CM | POA: Diagnosis not present

## 2021-05-11 DIAGNOSIS — Z7902 Long term (current) use of antithrombotics/antiplatelets: Secondary | ICD-10-CM | POA: Diagnosis not present

## 2021-05-11 DIAGNOSIS — N400 Enlarged prostate without lower urinary tract symptoms: Secondary | ICD-10-CM | POA: Diagnosis not present

## 2021-05-11 DIAGNOSIS — K7469 Other cirrhosis of liver: Secondary | ICD-10-CM | POA: Diagnosis not present

## 2021-05-11 DIAGNOSIS — Z9181 History of falling: Secondary | ICD-10-CM | POA: Diagnosis not present

## 2021-05-11 DIAGNOSIS — E119 Type 2 diabetes mellitus without complications: Secondary | ICD-10-CM | POA: Diagnosis not present

## 2021-05-11 NOTE — Telephone Encounter (Signed)
Left detailed message giving verbal ok.

## 2021-05-14 DIAGNOSIS — N400 Enlarged prostate without lower urinary tract symptoms: Secondary | ICD-10-CM | POA: Diagnosis not present

## 2021-05-14 DIAGNOSIS — M109 Gout, unspecified: Secondary | ICD-10-CM | POA: Diagnosis not present

## 2021-05-14 DIAGNOSIS — I69351 Hemiplegia and hemiparesis following cerebral infarction affecting right dominant side: Secondary | ICD-10-CM | POA: Diagnosis not present

## 2021-05-14 DIAGNOSIS — I10 Essential (primary) hypertension: Secondary | ICD-10-CM | POA: Diagnosis not present

## 2021-05-14 DIAGNOSIS — E119 Type 2 diabetes mellitus without complications: Secondary | ICD-10-CM | POA: Diagnosis not present

## 2021-05-14 DIAGNOSIS — E785 Hyperlipidemia, unspecified: Secondary | ICD-10-CM | POA: Diagnosis not present

## 2021-05-15 DIAGNOSIS — E785 Hyperlipidemia, unspecified: Secondary | ICD-10-CM | POA: Diagnosis not present

## 2021-05-15 DIAGNOSIS — E119 Type 2 diabetes mellitus without complications: Secondary | ICD-10-CM | POA: Diagnosis not present

## 2021-05-15 DIAGNOSIS — M109 Gout, unspecified: Secondary | ICD-10-CM | POA: Diagnosis not present

## 2021-05-15 DIAGNOSIS — I10 Essential (primary) hypertension: Secondary | ICD-10-CM | POA: Diagnosis not present

## 2021-05-15 DIAGNOSIS — N400 Enlarged prostate without lower urinary tract symptoms: Secondary | ICD-10-CM | POA: Diagnosis not present

## 2021-05-15 DIAGNOSIS — I69351 Hemiplegia and hemiparesis following cerebral infarction affecting right dominant side: Secondary | ICD-10-CM | POA: Diagnosis not present

## 2021-05-16 DIAGNOSIS — E785 Hyperlipidemia, unspecified: Secondary | ICD-10-CM | POA: Diagnosis not present

## 2021-05-16 DIAGNOSIS — M109 Gout, unspecified: Secondary | ICD-10-CM | POA: Diagnosis not present

## 2021-05-16 DIAGNOSIS — I10 Essential (primary) hypertension: Secondary | ICD-10-CM | POA: Diagnosis not present

## 2021-05-16 DIAGNOSIS — I69351 Hemiplegia and hemiparesis following cerebral infarction affecting right dominant side: Secondary | ICD-10-CM | POA: Diagnosis not present

## 2021-05-16 DIAGNOSIS — N400 Enlarged prostate without lower urinary tract symptoms: Secondary | ICD-10-CM | POA: Diagnosis not present

## 2021-05-16 DIAGNOSIS — E119 Type 2 diabetes mellitus without complications: Secondary | ICD-10-CM | POA: Diagnosis not present

## 2021-05-17 DIAGNOSIS — M109 Gout, unspecified: Secondary | ICD-10-CM | POA: Diagnosis not present

## 2021-05-17 DIAGNOSIS — I10 Essential (primary) hypertension: Secondary | ICD-10-CM | POA: Diagnosis not present

## 2021-05-17 DIAGNOSIS — E119 Type 2 diabetes mellitus without complications: Secondary | ICD-10-CM | POA: Diagnosis not present

## 2021-05-17 DIAGNOSIS — E785 Hyperlipidemia, unspecified: Secondary | ICD-10-CM | POA: Diagnosis not present

## 2021-05-17 DIAGNOSIS — I69351 Hemiplegia and hemiparesis following cerebral infarction affecting right dominant side: Secondary | ICD-10-CM | POA: Diagnosis not present

## 2021-05-17 DIAGNOSIS — N400 Enlarged prostate without lower urinary tract symptoms: Secondary | ICD-10-CM | POA: Diagnosis not present

## 2021-05-21 DIAGNOSIS — N400 Enlarged prostate without lower urinary tract symptoms: Secondary | ICD-10-CM | POA: Diagnosis not present

## 2021-05-21 DIAGNOSIS — M109 Gout, unspecified: Secondary | ICD-10-CM | POA: Diagnosis not present

## 2021-05-21 DIAGNOSIS — I69351 Hemiplegia and hemiparesis following cerebral infarction affecting right dominant side: Secondary | ICD-10-CM | POA: Diagnosis not present

## 2021-05-21 DIAGNOSIS — I10 Essential (primary) hypertension: Secondary | ICD-10-CM | POA: Diagnosis not present

## 2021-05-21 DIAGNOSIS — E785 Hyperlipidemia, unspecified: Secondary | ICD-10-CM | POA: Diagnosis not present

## 2021-05-21 DIAGNOSIS — E119 Type 2 diabetes mellitus without complications: Secondary | ICD-10-CM | POA: Diagnosis not present

## 2021-05-22 DIAGNOSIS — E119 Type 2 diabetes mellitus without complications: Secondary | ICD-10-CM | POA: Diagnosis not present

## 2021-05-22 DIAGNOSIS — N400 Enlarged prostate without lower urinary tract symptoms: Secondary | ICD-10-CM | POA: Diagnosis not present

## 2021-05-22 DIAGNOSIS — E785 Hyperlipidemia, unspecified: Secondary | ICD-10-CM | POA: Diagnosis not present

## 2021-05-22 DIAGNOSIS — M109 Gout, unspecified: Secondary | ICD-10-CM | POA: Diagnosis not present

## 2021-05-22 DIAGNOSIS — I69351 Hemiplegia and hemiparesis following cerebral infarction affecting right dominant side: Secondary | ICD-10-CM | POA: Diagnosis not present

## 2021-05-22 DIAGNOSIS — I10 Essential (primary) hypertension: Secondary | ICD-10-CM | POA: Diagnosis not present

## 2021-05-24 ENCOUNTER — Telehealth: Payer: Self-pay

## 2021-05-24 DIAGNOSIS — I10 Essential (primary) hypertension: Secondary | ICD-10-CM | POA: Diagnosis not present

## 2021-05-24 DIAGNOSIS — N400 Enlarged prostate without lower urinary tract symptoms: Secondary | ICD-10-CM | POA: Diagnosis not present

## 2021-05-24 DIAGNOSIS — E785 Hyperlipidemia, unspecified: Secondary | ICD-10-CM | POA: Diagnosis not present

## 2021-05-24 DIAGNOSIS — I69351 Hemiplegia and hemiparesis following cerebral infarction affecting right dominant side: Secondary | ICD-10-CM | POA: Diagnosis not present

## 2021-05-24 DIAGNOSIS — E119 Type 2 diabetes mellitus without complications: Secondary | ICD-10-CM | POA: Diagnosis not present

## 2021-05-24 DIAGNOSIS — M109 Gout, unspecified: Secondary | ICD-10-CM | POA: Diagnosis not present

## 2021-05-24 NOTE — Telephone Encounter (Signed)
Please review. Fax is in your stack to sign. Thanks!

## 2021-05-24 NOTE — Telephone Encounter (Signed)
Copied from [place] [phone]. Topic: General - Inquiry >> May 24, 2021  9:42 AM Loma Boston wrote: Reason for CRM: Deanna from [facility], states has cb before on this order and states told in Dr Darnell Level file in office, still not received # order [phone] date sent 8/12. Please FU at her personal # if did not receive [phone]

## 2021-05-29 DIAGNOSIS — E785 Hyperlipidemia, unspecified: Secondary | ICD-10-CM | POA: Diagnosis not present

## 2021-05-29 DIAGNOSIS — I10 Essential (primary) hypertension: Secondary | ICD-10-CM | POA: Diagnosis not present

## 2021-05-29 DIAGNOSIS — N400 Enlarged prostate without lower urinary tract symptoms: Secondary | ICD-10-CM | POA: Diagnosis not present

## 2021-05-29 DIAGNOSIS — E119 Type 2 diabetes mellitus without complications: Secondary | ICD-10-CM | POA: Diagnosis not present

## 2021-05-29 DIAGNOSIS — M109 Gout, unspecified: Secondary | ICD-10-CM | POA: Diagnosis not present

## 2021-05-29 DIAGNOSIS — I69351 Hemiplegia and hemiparesis following cerebral infarction affecting right dominant side: Secondary | ICD-10-CM | POA: Diagnosis not present

## 2021-05-30 DIAGNOSIS — E119 Type 2 diabetes mellitus without complications: Secondary | ICD-10-CM | POA: Diagnosis not present

## 2021-05-30 DIAGNOSIS — I10 Essential (primary) hypertension: Secondary | ICD-10-CM | POA: Diagnosis not present

## 2021-05-30 DIAGNOSIS — M109 Gout, unspecified: Secondary | ICD-10-CM | POA: Diagnosis not present

## 2021-05-30 DIAGNOSIS — I69351 Hemiplegia and hemiparesis following cerebral infarction affecting right dominant side: Secondary | ICD-10-CM | POA: Diagnosis not present

## 2021-05-30 DIAGNOSIS — N400 Enlarged prostate without lower urinary tract symptoms: Secondary | ICD-10-CM | POA: Diagnosis not present

## 2021-05-30 DIAGNOSIS — E785 Hyperlipidemia, unspecified: Secondary | ICD-10-CM | POA: Diagnosis not present

## 2021-05-31 DIAGNOSIS — E785 Hyperlipidemia, unspecified: Secondary | ICD-10-CM | POA: Diagnosis not present

## 2021-05-31 DIAGNOSIS — I10 Essential (primary) hypertension: Secondary | ICD-10-CM | POA: Diagnosis not present

## 2021-05-31 DIAGNOSIS — N400 Enlarged prostate without lower urinary tract symptoms: Secondary | ICD-10-CM | POA: Diagnosis not present

## 2021-05-31 DIAGNOSIS — I69351 Hemiplegia and hemiparesis following cerebral infarction affecting right dominant side: Secondary | ICD-10-CM | POA: Diagnosis not present

## 2021-05-31 DIAGNOSIS — M109 Gout, unspecified: Secondary | ICD-10-CM | POA: Diagnosis not present

## 2021-05-31 DIAGNOSIS — E119 Type 2 diabetes mellitus without complications: Secondary | ICD-10-CM | POA: Diagnosis not present

## 2021-06-04 DIAGNOSIS — E119 Type 2 diabetes mellitus without complications: Secondary | ICD-10-CM | POA: Diagnosis not present

## 2021-06-04 DIAGNOSIS — I10 Essential (primary) hypertension: Secondary | ICD-10-CM | POA: Diagnosis not present

## 2021-06-04 DIAGNOSIS — E785 Hyperlipidemia, unspecified: Secondary | ICD-10-CM | POA: Diagnosis not present

## 2021-06-04 DIAGNOSIS — M109 Gout, unspecified: Secondary | ICD-10-CM | POA: Diagnosis not present

## 2021-06-04 DIAGNOSIS — N400 Enlarged prostate without lower urinary tract symptoms: Secondary | ICD-10-CM | POA: Diagnosis not present

## 2021-06-04 DIAGNOSIS — I69351 Hemiplegia and hemiparesis following cerebral infarction affecting right dominant side: Secondary | ICD-10-CM | POA: Diagnosis not present

## 2021-06-05 ENCOUNTER — Telehealth: Payer: Self-pay

## 2021-06-05 DIAGNOSIS — M109 Gout, unspecified: Secondary | ICD-10-CM | POA: Diagnosis not present

## 2021-06-05 DIAGNOSIS — I69351 Hemiplegia and hemiparesis following cerebral infarction affecting right dominant side: Secondary | ICD-10-CM | POA: Diagnosis not present

## 2021-06-05 DIAGNOSIS — E119 Type 2 diabetes mellitus without complications: Secondary | ICD-10-CM | POA: Diagnosis not present

## 2021-06-05 DIAGNOSIS — N400 Enlarged prostate without lower urinary tract symptoms: Secondary | ICD-10-CM | POA: Diagnosis not present

## 2021-06-05 DIAGNOSIS — I10 Essential (primary) hypertension: Secondary | ICD-10-CM | POA: Diagnosis not present

## 2021-06-05 DIAGNOSIS — E785 Hyperlipidemia, unspecified: Secondary | ICD-10-CM | POA: Diagnosis not present

## 2021-06-05 NOTE — Telephone Encounter (Signed)
Copied from [place] [phone]. Topic: Quick Communication - Home Health Verbal Orders >> Jun 05, 2021  2:04 PM Tessa Lerner A wrote: Caller/Agency:Stephanie Asa [facility]ente  Callback Number: [phone]  Requesting OT/PT/Skilled Nursing/Social Work/Speech Therapy: OT  Frequency: Additional Frequent 1w3

## 2021-06-07 DIAGNOSIS — N400 Enlarged prostate without lower urinary tract symptoms: Secondary | ICD-10-CM | POA: Diagnosis not present

## 2021-06-07 DIAGNOSIS — E785 Hyperlipidemia, unspecified: Secondary | ICD-10-CM | POA: Diagnosis not present

## 2021-06-07 DIAGNOSIS — I10 Essential (primary) hypertension: Secondary | ICD-10-CM | POA: Diagnosis not present

## 2021-06-07 DIAGNOSIS — I69351 Hemiplegia and hemiparesis following cerebral infarction affecting right dominant side: Secondary | ICD-10-CM | POA: Diagnosis not present

## 2021-06-07 DIAGNOSIS — M109 Gout, unspecified: Secondary | ICD-10-CM | POA: Diagnosis not present

## 2021-06-07 DIAGNOSIS — E119 Type 2 diabetes mellitus without complications: Secondary | ICD-10-CM | POA: Diagnosis not present

## 2021-06-07 NOTE — Telephone Encounter (Signed)
Left detailed message giving verbal okay. 

## 2021-06-10 DIAGNOSIS — Z7982 Long term (current) use of aspirin: Secondary | ICD-10-CM | POA: Diagnosis not present

## 2021-06-10 DIAGNOSIS — Z9181 History of falling: Secondary | ICD-10-CM | POA: Diagnosis not present

## 2021-06-10 DIAGNOSIS — Z6841 Body Mass Index (BMI) 40.0 and over, adult: Secondary | ICD-10-CM | POA: Diagnosis not present

## 2021-06-10 DIAGNOSIS — M109 Gout, unspecified: Secondary | ICD-10-CM | POA: Diagnosis not present

## 2021-06-10 DIAGNOSIS — D472 Monoclonal gammopathy: Secondary | ICD-10-CM | POA: Diagnosis not present

## 2021-06-10 DIAGNOSIS — I69351 Hemiplegia and hemiparesis following cerebral infarction affecting right dominant side: Secondary | ICD-10-CM | POA: Diagnosis not present

## 2021-06-10 DIAGNOSIS — Z8572 Personal history of non-Hodgkin lymphomas: Secondary | ICD-10-CM | POA: Diagnosis not present

## 2021-06-10 DIAGNOSIS — N400 Enlarged prostate without lower urinary tract symptoms: Secondary | ICD-10-CM | POA: Diagnosis not present

## 2021-06-10 DIAGNOSIS — I10 Essential (primary) hypertension: Secondary | ICD-10-CM | POA: Diagnosis not present

## 2021-06-10 DIAGNOSIS — Z7984 Long term (current) use of oral hypoglycemic drugs: Secondary | ICD-10-CM | POA: Diagnosis not present

## 2021-06-10 DIAGNOSIS — Z7902 Long term (current) use of antithrombotics/antiplatelets: Secondary | ICD-10-CM | POA: Diagnosis not present

## 2021-06-10 DIAGNOSIS — E119 Type 2 diabetes mellitus without complications: Secondary | ICD-10-CM | POA: Diagnosis not present

## 2021-06-10 DIAGNOSIS — K219 Gastro-esophageal reflux disease without esophagitis: Secondary | ICD-10-CM | POA: Diagnosis not present

## 2021-06-10 DIAGNOSIS — K5900 Constipation, unspecified: Secondary | ICD-10-CM | POA: Diagnosis not present

## 2021-06-10 DIAGNOSIS — Z87891 Personal history of nicotine dependence: Secondary | ICD-10-CM | POA: Diagnosis not present

## 2021-06-10 DIAGNOSIS — Z993 Dependence on wheelchair: Secondary | ICD-10-CM | POA: Diagnosis not present

## 2021-06-10 DIAGNOSIS — K7469 Other cirrhosis of liver: Secondary | ICD-10-CM | POA: Diagnosis not present

## 2021-06-10 DIAGNOSIS — E785 Hyperlipidemia, unspecified: Secondary | ICD-10-CM | POA: Diagnosis not present

## 2021-06-11 ENCOUNTER — Other Ambulatory Visit: Payer: Self-pay | Admitting: Family Medicine

## 2021-06-11 DIAGNOSIS — I69351 Hemiplegia and hemiparesis following cerebral infarction affecting right dominant side: Secondary | ICD-10-CM | POA: Diagnosis not present

## 2021-06-11 DIAGNOSIS — N400 Enlarged prostate without lower urinary tract symptoms: Secondary | ICD-10-CM | POA: Diagnosis not present

## 2021-06-11 DIAGNOSIS — E785 Hyperlipidemia, unspecified: Secondary | ICD-10-CM | POA: Diagnosis not present

## 2021-06-11 DIAGNOSIS — C8333 Diffuse large B-cell lymphoma, intra-abdominal lymph nodes: Secondary | ICD-10-CM

## 2021-06-11 DIAGNOSIS — M109 Gout, unspecified: Secondary | ICD-10-CM | POA: Diagnosis not present

## 2021-06-11 DIAGNOSIS — E119 Type 2 diabetes mellitus without complications: Secondary | ICD-10-CM | POA: Diagnosis not present

## 2021-06-11 DIAGNOSIS — I10 Essential (primary) hypertension: Secondary | ICD-10-CM | POA: Diagnosis not present

## 2021-06-12 DIAGNOSIS — E119 Type 2 diabetes mellitus without complications: Secondary | ICD-10-CM | POA: Diagnosis not present

## 2021-06-12 DIAGNOSIS — N400 Enlarged prostate without lower urinary tract symptoms: Secondary | ICD-10-CM | POA: Diagnosis not present

## 2021-06-12 DIAGNOSIS — I10 Essential (primary) hypertension: Secondary | ICD-10-CM | POA: Diagnosis not present

## 2021-06-12 DIAGNOSIS — I69351 Hemiplegia and hemiparesis following cerebral infarction affecting right dominant side: Secondary | ICD-10-CM | POA: Diagnosis not present

## 2021-06-12 DIAGNOSIS — E785 Hyperlipidemia, unspecified: Secondary | ICD-10-CM | POA: Diagnosis not present

## 2021-06-12 DIAGNOSIS — M109 Gout, unspecified: Secondary | ICD-10-CM | POA: Diagnosis not present

## 2021-06-13 DIAGNOSIS — I10 Essential (primary) hypertension: Secondary | ICD-10-CM | POA: Diagnosis not present

## 2021-06-13 DIAGNOSIS — N400 Enlarged prostate without lower urinary tract symptoms: Secondary | ICD-10-CM | POA: Diagnosis not present

## 2021-06-13 DIAGNOSIS — E785 Hyperlipidemia, unspecified: Secondary | ICD-10-CM | POA: Diagnosis not present

## 2021-06-13 DIAGNOSIS — E119 Type 2 diabetes mellitus without complications: Secondary | ICD-10-CM | POA: Diagnosis not present

## 2021-06-13 DIAGNOSIS — I69351 Hemiplegia and hemiparesis following cerebral infarction affecting right dominant side: Secondary | ICD-10-CM | POA: Diagnosis not present

## 2021-06-13 DIAGNOSIS — M109 Gout, unspecified: Secondary | ICD-10-CM | POA: Diagnosis not present

## 2021-06-14 DIAGNOSIS — N400 Enlarged prostate without lower urinary tract symptoms: Secondary | ICD-10-CM | POA: Diagnosis not present

## 2021-06-14 DIAGNOSIS — M109 Gout, unspecified: Secondary | ICD-10-CM | POA: Diagnosis not present

## 2021-06-14 DIAGNOSIS — I69351 Hemiplegia and hemiparesis following cerebral infarction affecting right dominant side: Secondary | ICD-10-CM | POA: Diagnosis not present

## 2021-06-14 DIAGNOSIS — E785 Hyperlipidemia, unspecified: Secondary | ICD-10-CM | POA: Diagnosis not present

## 2021-06-14 DIAGNOSIS — E119 Type 2 diabetes mellitus without complications: Secondary | ICD-10-CM | POA: Diagnosis not present

## 2021-06-14 DIAGNOSIS — I10 Essential (primary) hypertension: Secondary | ICD-10-CM | POA: Diagnosis not present

## 2021-06-18 DIAGNOSIS — M109 Gout, unspecified: Secondary | ICD-10-CM | POA: Diagnosis not present

## 2021-06-18 DIAGNOSIS — E785 Hyperlipidemia, unspecified: Secondary | ICD-10-CM | POA: Diagnosis not present

## 2021-06-18 DIAGNOSIS — I69351 Hemiplegia and hemiparesis following cerebral infarction affecting right dominant side: Secondary | ICD-10-CM | POA: Diagnosis not present

## 2021-06-18 DIAGNOSIS — I10 Essential (primary) hypertension: Secondary | ICD-10-CM | POA: Diagnosis not present

## 2021-06-18 DIAGNOSIS — N400 Enlarged prostate without lower urinary tract symptoms: Secondary | ICD-10-CM | POA: Diagnosis not present

## 2021-06-18 DIAGNOSIS — E119 Type 2 diabetes mellitus without complications: Secondary | ICD-10-CM | POA: Diagnosis not present

## 2021-06-19 DIAGNOSIS — I69351 Hemiplegia and hemiparesis following cerebral infarction affecting right dominant side: Secondary | ICD-10-CM | POA: Diagnosis not present

## 2021-06-19 DIAGNOSIS — I10 Essential (primary) hypertension: Secondary | ICD-10-CM | POA: Diagnosis not present

## 2021-06-19 DIAGNOSIS — E119 Type 2 diabetes mellitus without complications: Secondary | ICD-10-CM | POA: Diagnosis not present

## 2021-06-19 DIAGNOSIS — M109 Gout, unspecified: Secondary | ICD-10-CM | POA: Diagnosis not present

## 2021-06-19 DIAGNOSIS — N400 Enlarged prostate without lower urinary tract symptoms: Secondary | ICD-10-CM | POA: Diagnosis not present

## 2021-06-19 DIAGNOSIS — E785 Hyperlipidemia, unspecified: Secondary | ICD-10-CM | POA: Diagnosis not present

## 2021-06-20 DIAGNOSIS — E785 Hyperlipidemia, unspecified: Secondary | ICD-10-CM | POA: Diagnosis not present

## 2021-06-20 DIAGNOSIS — N400 Enlarged prostate without lower urinary tract symptoms: Secondary | ICD-10-CM | POA: Diagnosis not present

## 2021-06-20 DIAGNOSIS — E119 Type 2 diabetes mellitus without complications: Secondary | ICD-10-CM | POA: Diagnosis not present

## 2021-06-20 DIAGNOSIS — I10 Essential (primary) hypertension: Secondary | ICD-10-CM | POA: Diagnosis not present

## 2021-06-20 DIAGNOSIS — M109 Gout, unspecified: Secondary | ICD-10-CM | POA: Diagnosis not present

## 2021-06-20 DIAGNOSIS — I69351 Hemiplegia and hemiparesis following cerebral infarction affecting right dominant side: Secondary | ICD-10-CM | POA: Diagnosis not present

## 2021-06-21 DIAGNOSIS — E785 Hyperlipidemia, unspecified: Secondary | ICD-10-CM | POA: Diagnosis not present

## 2021-06-21 DIAGNOSIS — I69351 Hemiplegia and hemiparesis following cerebral infarction affecting right dominant side: Secondary | ICD-10-CM | POA: Diagnosis not present

## 2021-06-21 DIAGNOSIS — E119 Type 2 diabetes mellitus without complications: Secondary | ICD-10-CM | POA: Diagnosis not present

## 2021-06-21 DIAGNOSIS — N400 Enlarged prostate without lower urinary tract symptoms: Secondary | ICD-10-CM | POA: Diagnosis not present

## 2021-06-21 DIAGNOSIS — I10 Essential (primary) hypertension: Secondary | ICD-10-CM | POA: Diagnosis not present

## 2021-06-21 DIAGNOSIS — M109 Gout, unspecified: Secondary | ICD-10-CM | POA: Diagnosis not present

## 2021-06-25 DIAGNOSIS — E119 Type 2 diabetes mellitus without complications: Secondary | ICD-10-CM | POA: Diagnosis not present

## 2021-06-25 DIAGNOSIS — N400 Enlarged prostate without lower urinary tract symptoms: Secondary | ICD-10-CM | POA: Diagnosis not present

## 2021-06-25 DIAGNOSIS — M109 Gout, unspecified: Secondary | ICD-10-CM | POA: Diagnosis not present

## 2021-06-25 DIAGNOSIS — E785 Hyperlipidemia, unspecified: Secondary | ICD-10-CM | POA: Diagnosis not present

## 2021-06-25 DIAGNOSIS — I69351 Hemiplegia and hemiparesis following cerebral infarction affecting right dominant side: Secondary | ICD-10-CM | POA: Diagnosis not present

## 2021-06-25 DIAGNOSIS — I10 Essential (primary) hypertension: Secondary | ICD-10-CM | POA: Diagnosis not present

## 2021-06-26 DIAGNOSIS — M109 Gout, unspecified: Secondary | ICD-10-CM | POA: Diagnosis not present

## 2021-06-26 DIAGNOSIS — E785 Hyperlipidemia, unspecified: Secondary | ICD-10-CM | POA: Diagnosis not present

## 2021-06-26 DIAGNOSIS — E119 Type 2 diabetes mellitus without complications: Secondary | ICD-10-CM | POA: Diagnosis not present

## 2021-06-26 DIAGNOSIS — I10 Essential (primary) hypertension: Secondary | ICD-10-CM | POA: Diagnosis not present

## 2021-06-26 DIAGNOSIS — N400 Enlarged prostate without lower urinary tract symptoms: Secondary | ICD-10-CM | POA: Diagnosis not present

## 2021-06-26 DIAGNOSIS — I69351 Hemiplegia and hemiparesis following cerebral infarction affecting right dominant side: Secondary | ICD-10-CM | POA: Diagnosis not present

## 2021-07-09 ENCOUNTER — Other Ambulatory Visit: Payer: Self-pay | Admitting: Family Medicine

## 2021-07-09 NOTE — Telephone Encounter (Signed)
Requested Prescriptions  Pending Prescriptions Disp Refills  . atorvastatin (LIPITOR) 80 MG tablet [Pharmacy Med Name: ATORVASTATIN CALCIUM 80 MG TAB] 90 tablet 2    Sig: TAKE ONE TABLET (80 MG) BY MOUTH EVERY DAY     Cardiovascular:  Antilipid - Statins Failed - 07/09/2021  4:36 AM      Failed - HDL in normal range and within 360 days    HDL  Date Value Ref Range Status  12/01/2020 28 (L) >40 mg/dL Final  05/09/2020 30 (L) >39 mg/dL Final         Failed - Triglycerides in normal range and within 360 days    Triglycerides  Date Value Ref Range Status  12/01/2020 270 (H) <150 mg/dL Final         Passed - Total Cholesterol in normal range and within 360 days    Cholesterol, Total  Date Value Ref Range Status  05/09/2020 207 (H) 100 - 199 mg/dL Final   Cholesterol  Date Value Ref Range Status  12/01/2020 153 0 - 200 mg/dL Final         Passed - LDL in normal range and within 360 days    LDL Chol Calc (NIH)  Date Value Ref Range Status  05/09/2020 94 0 - 99 mg/dL Final   LDL Cholesterol  Date Value Ref Range Status  12/01/2020 71 0 - 99 mg/dL Final    Comment:           Total Cholesterol/HDL:CHD Risk Coronary Heart Disease Risk Table                     Men   Women  1/2 Average Risk   3.4   3.3  Average Risk       5.0   4.4  2 X Average Risk   9.6   7.1  3 X Average Risk  23.4   11.0        Use the calculated Patient Ratio above and the CHD Risk Table to determine the patient's CHD Risk.        ATP III CLASSIFICATION (LDL):  <100     mg/dL   Optimal  100-129  mg/dL   Near or Above                    Optimal  130-159  mg/dL   Borderline  160-189  mg/dL   High  >190     mg/dL   Very High Performed at [facility], [address], [place], [place] [address]          Passed - Patient is not pregnant      Passed - Valid encounter within last 12 months    Recent Outpatient Visits          3 months ago Right sided weakness   [facility] [name]., MD   9 months ago Left pontine cerebrovascular accident [facility])   [facility] [name]., MD   10 months ago [place], [place]   11 months ago Left pontine cerebrovascular accident [facility])   [facility] [name]., MD   1 year ago Obesity, morbid (more than 100 lbs over ideal weight or BMI > 40) [facility])   [facility] [name]., MD

## 2021-07-20 DIAGNOSIS — Z23 Encounter for immunization: Secondary | ICD-10-CM | POA: Diagnosis not present

## 2021-08-06 ENCOUNTER — Other Ambulatory Visit: Payer: Self-pay | Admitting: Family Medicine

## 2021-08-06 DIAGNOSIS — I10 Essential (primary) hypertension: Secondary | ICD-10-CM

## 2021-08-06 NOTE — Telephone Encounter (Signed)
Requested Prescriptions  Pending Prescriptions Disp Refills  . amLODipine (NORVASC) 10 MG tablet [Pharmacy Med Name: AMLODIPINE BESYLATE 10 MG TAB] 90 tablet 0    Sig: TAKE ONE TABLET BY MOUTH EVERY DAY     Cardiovascular:  Calcium Channel Blockers Failed - 08/06/2021 11:23 AM      Failed - Last BP in normal range    BP Readings from Last 1 Encounters:  12/04/20 (!) 166/82         Passed - Valid encounter within last 6 months    Recent Outpatient Visits          4 months ago Right sided weakness   [facility] [name]., MD   10 months ago Left pontine cerebrovascular accident [facility])   [facility] [name]., MD   11 months ago [place], PA-C   1 year ago Left pontine cerebrovascular accident [facility])   [facility] [name]., MD   1 year ago Obesity, morbid (more than 100 lbs over ideal weight or BMI > 40) [facility])   [facility] [name]., MD

## 2021-08-07 ENCOUNTER — Other Ambulatory Visit: Payer: Self-pay | Admitting: Family Medicine

## 2021-08-07 ENCOUNTER — Telehealth: Payer: Self-pay

## 2021-08-07 DIAGNOSIS — I10 Essential (primary) hypertension: Secondary | ICD-10-CM

## 2021-08-07 NOTE — Telephone Encounter (Signed)
Copied from [place] [phone]. Topic: General - Other >> Aug 07, 2021  2:32 PM Celene Kras wrote: Reason for CRM: Pts wife, [name], called and is requesting to know if there was a way for the pt to be seen at home visit so that the pt can have labs done. She states that the pt had a stroke in March and has not been able to see PCP since then. Please advise.

## 2021-08-07 NOTE — Telephone Encounter (Signed)
Please advise 

## 2021-08-08 ENCOUNTER — Other Ambulatory Visit: Payer: Self-pay | Admitting: *Deleted

## 2021-08-08 DIAGNOSIS — G459 Transient cerebral ischemic attack, unspecified: Secondary | ICD-10-CM

## 2021-08-08 DIAGNOSIS — R531 Weakness: Secondary | ICD-10-CM

## 2021-08-08 DIAGNOSIS — E119 Type 2 diabetes mellitus without complications: Secondary | ICD-10-CM

## 2021-08-08 DIAGNOSIS — I6930 Unspecified sequelae of cerebral infarction: Secondary | ICD-10-CM

## 2021-08-08 DIAGNOSIS — K7460 Unspecified cirrhosis of liver: Secondary | ICD-10-CM

## 2021-08-08 NOTE — Telephone Encounter (Signed)
CCM referral ordered.

## 2021-08-13 ENCOUNTER — Telehealth: Payer: Self-pay | Admitting: *Deleted

## 2021-08-13 NOTE — Chronic Care Management (AMB) (Signed)
  Chronic Care Management   Note  08/13/2021 Name: Peter Ryan MRN: [id number] DOB: 20-Oct-1950  Peter Ryan is a 70 y.o. year old male who is a primary care patient of [name]., MD. I reached out to Andree Coss by phone today in response to a referral sent by Peter Ryan PCP.  Mr. Clayburn was given information about Chronic Care Management services today including:  CCM service includes personalized support from designated clinical staff supervised by his physician, including individualized plan of care and coordination with other care providers 24/7 contact phone numbers for assistance for urgent and routine care needs. Service will only be billed when office clinical staff spend 20 minutes or more in a month to coordinate care. Only one practitioner may furnish and bill the service in a calendar month. The patient may stop CCM services at any time (effective at the end of the month) by phone call to the office staff. The patient is responsible for co-pay (up to 20% after annual deductible is met) if co-pay is required by the individual health plan.   Patient agreed to services and verbal consent obtained.   Follow up plan: Telephone appointment with care management team member scheduled for: 08/16/2021  Julian Hy, [place] [facility]  Direct Dial: [phone]

## 2021-08-13 NOTE — Chronic Care Management (AMB) (Signed)
  Chronic Care Management   Outreach Note  08/13/2021 Name: Peter Ryan MRN: [id number] DOB: 1951-08-22  [name] is a 70 y.o. year old male who is a primary care patient of [name]., MD. I reached out to Andree Coss by phone today in response to a referral sent by Mr. Nell Schrack primary care provider.  An unsuccessful telephone outreach was attempted today. The patient was referred to the case management team for assistance with care management and care coordination.   Follow Up Plan: A HIPAA compliant phone message was left for the patient providing contact information and requesting a return call.  If patient returns call to provider office, please advise to call Embedded Care Management Care Guide Ima Hafner at [place], [place] [facility]  Direct Dial: [phone]

## 2021-08-16 ENCOUNTER — Telehealth: Payer: Self-pay | Admitting: *Deleted

## 2021-08-16 ENCOUNTER — Ambulatory Visit (INDEPENDENT_AMBULATORY_CARE_PROVIDER_SITE_OTHER): Payer: Medicare Other | Admitting: *Deleted

## 2021-08-16 DIAGNOSIS — I6930 Unspecified sequelae of cerebral infarction: Secondary | ICD-10-CM

## 2021-08-16 DIAGNOSIS — E119 Type 2 diabetes mellitus without complications: Secondary | ICD-10-CM

## 2021-08-16 DIAGNOSIS — R531 Weakness: Secondary | ICD-10-CM

## 2021-08-16 DIAGNOSIS — G459 Transient cerebral ischemic attack, unspecified: Secondary | ICD-10-CM

## 2021-08-16 NOTE — Chronic Care Management (AMB) (Signed)
Chronic Care Management    Clinical Social Work Note  08/16/2021 Name: Peter Ryan MRN: [id number] DOB: Feb 24, 1951  Peter Ryan is a 70 y.o. year old male who is a primary care patient of [name]., MD. The CCM team was consulted to assist the patient with chronic disease management and/or care coordination needs related to: [employer] .   Collaboration with patient's spouse and Palliative Care  for initial visit in response to provider referral for social work chronic care management and care coordination services.   Patient's spouse contacted to discuss in home care needs. Patient's spouse requesting referral for Palliative care. She is also asking for labs to be drawn through Palliative Care as well due to patient's medical condition. Palliative care referral completed, spoke with Stacey [phone] who will reach out to patient's spouse to schedule initial intake appointment.  Consent to Services:  The patient was given the following information about Chronic Care Management services today, agreed to services, and gave verbal consent: 1. CCM service includes personalized support from designated clinical staff supervised by the primary care provider, including individualized plan of care and coordination with other care providers 2. 24/7 contact phone numbers for assistance for urgent and routine care needs. 3. Service will only be billed when office clinical staff spend 20 minutes or more in a month to coordinate care. 4. Only one practitioner may furnish and bill the service in a calendar month. 5.The patient may stop CCM services at any time (effective at the end of the month) by phone call to the office staff. 6. The patient will be responsible for cost sharing (co-pay) of up to 20% of the service fee (after annual deductible is met). Patient agreed to services and consent obtained.  Patient agreed to services and consent obtained.   Assessment: Review of  patient past medical history, allergies, medications, and health status, including review of relevant consultants reports was performed today as part of a comprehensive evaluation and provision of chronic care management and care coordination services.     SDOH (Social Determinants of Health) assessments and interventions performed:    Advanced Directives Status: Not addressed in this encounter.  CCM Care Plan  Allergies  Allergen Reactions   Hctz [Hydrochlorothiazide] Other (See Comments)    Gout flare   Metformin Other (See Comments)    Stomach upset     Outpatient Encounter Medications as of 08/16/2021  Medication Sig Note   acetaminophen (TYLENOL) 325 MG tablet Take 2 tablets (650 mg total) by mouth every 4 (four) hours as needed for mild pain (or temp > 37.5 C (99.5 F)).    allopurinol (ZYLOPRIM) 300 MG tablet TAKE ONE TABLET EVERY DAY    amLODipine (NORVASC) 10 MG tablet TAKE ONE TABLET BY MOUTH EVERY DAY    aspirin EC 325 MG EC tablet Take 1 tablet (325 mg total) by mouth daily.    atorvastatin (LIPITOR) 80 MG tablet TAKE ONE TABLET (80 MG) BY MOUTH EVERY DAY    Cholecalciferol (VITAMIN D3) 25 MCG (1000 UT) CAPS Take 1 capsule (1,000 Units total) by mouth daily.    cloNIDine (CATAPRES - DOSED IN MG/24 HR) 0.1 mg/24hr patch Place 1 patch (0.1 mg total) onto the skin once a week.    cloNIDine (CATAPRES) 0.1 MG tablet Take 1 tablet (0.1 mg total) by mouth 2 (two) times daily.    dapagliflozin propanediol (FARXIGA) 10 MG TABS tablet Take 1 tablet (10 mg total) by mouth daily.  finasteride (PROSCAR) 5 MG tablet Take 1 tablet (5 mg total) by mouth daily.    glimepiride (AMARYL) 2 MG tablet TAKE 1 TABLET BY MOUTH DAILY WITH BREAKFAST (Patient not taking: Reported on 03/15/2021)    hydrocortisone 2.5 % lotion Apply topically as directed. Apply to affected areas as needed up to three times weekly.    ketoconazole (NIZORAL) 2 % shampoo Apply 1 application topically as directed. Apply  small amount every other day to affected areas. Let sit several minutes before rinsing.    losartan (COZAAR) 100 MG tablet TAKE ONE TABLET EVERY DAY BY MOUTH (Patient taking differently: Take 100 mg by mouth daily. TAKE ONE TABLET EVERY DAY BY MOUTH)    polyethylene glycol (MIRALAX / GLYCOLAX) 17 g packet Take 17 g by mouth daily. 10/11/2020: As needed   vitamin B-12 (CYANOCOBALAMIN) 1000 MCG tablet Take 1 tablet (1,000 mcg total) by mouth daily.    Facility-Administered Encounter Medications as of 08/16/2021  Medication   sodium chloride flush (NS) 0.9 % injection 10 mL    Patient Active Problem List   Diagnosis Date Noted   TIA (transient ischemic attack) 12/01/2020   Right sided weakness 11/30/2020   Hypertension associated with diabetes (HCC) 11/30/2020   History of stroke with residual deficit 11/30/2020   Left pontine cerebrovascular accident (HCC) 06/14/2020   Facial droop    Rectal bleeding    Morbid obesity (HCC)    Right hemiparesis (HCC)    Controlled type 2 diabetes mellitus with hyperglycemia, without long-term current use of insulin (HCC)    Acute CVA (cerebrovascular accident) (HCC) 06/05/2020   Obesity, Class III, BMI 40-49.9 (morbid obesity) (HCC) 06/05/2020   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) 05/02/2020   Atherosclerosis of aorta (HCC) 05/02/2020   Goals of care, counseling/discussion 04/08/2017   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 (HCC) 04/08/2017   Ascites 02/08/2017   Liver cirrhosis (HCC) 02/08/2017   SBP (spontaneous bacterial peritonitis) (HCC) 02/07/2017   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   DOE (dyspnea on exertion) 11/15/2016   Obesity, morbid (more than 100 lbs over ideal weight or BMI > 40) (HCC) 11/15/2016   Elevated PSA 05/30/2015   BPH with obstruction/lower urinary tract symptoms 05/30/2015   Type 2 diabetes mellitus (HCC) 05/18/2014   Arthritis 01/03/2014    Hyperlipidemia associated with type 2 diabetes mellitus (HCC) 01/03/2014   Benign essential hypertension 01/03/2014   Gout 01/03/2014   Psoriasis 01/03/2014   Reflux 01/03/2014    Conditions to be addressed/monitored:     I69.30 (ICD-10-CM) - History of stroke with residual deficit  R53.1 (ICD-10-CM) - Right sided weakness  G45.9 (ICD-10-CM) - TIA (transient ischemic attack)  K74.60 (ICD-10-CM) - Cirrhosis of liver without ascites, unspecified hepatic cirrhosis type (HCC)  E11.9 (ICD-10-CM) - Type 2 diabetes mellitus without complication, without long-term current use of insulin (HCC)   There are no care plans that you recently modified to display for this patient.    Follow Up Plan:  Patient's spouse verbalized having no additional community resource needs. Patient's spouse  requesting that patient  be followed by Palliative Care.       Chrystal Land, LCSW Clinical Social Worker  [facility] Care Management [phone]    

## 2021-08-16 NOTE — Telephone Encounter (Signed)
Please advise 

## 2021-08-16 NOTE — Telephone Encounter (Signed)
Patient is lab orders. Also CCM states we must see patient before they can do the referral Palliative Care.

## 2021-08-16 NOTE — Telephone Encounter (Signed)
Copied from [place] [id number]. Topic: Referral - Request for Referral >> Aug 13, 2021 11:02 AM Tessa Lerner A wrote: Has patient seen PCP for this complaint? Yes.   *If NO, is insurance requiring patient see PCP for this issue before PCP can refer them? Referral for which specialty: Palliative Care Preferred provider/office: [id number] [phone] option 2  Reason for referral: Palliative care

## 2021-08-16 NOTE — Telephone Encounter (Signed)
Copied from [place] [phone]. Topic: General - Other >> Aug 13, 2021 11:04 AM Tessa Lerner A wrote: Reason for CRM: The patient's wife has called to request orders for the patient to have lab work done  The patient's wife would like all of the patient's previous labs ran as well as diabetic testing   Please contact further when possible

## 2021-08-20 ENCOUNTER — Other Ambulatory Visit: Payer: Self-pay | Admitting: Family Medicine

## 2021-08-20 DIAGNOSIS — I10 Essential (primary) hypertension: Secondary | ICD-10-CM

## 2021-08-20 NOTE — Telephone Encounter (Signed)
Requested medications are on the active medication list yes  Last refill 07/25/21  Last visit 03/15/21 video visit  Future visit scheduled 10/17/21  Notes to clinic Clonidine patch as well as Clonidine po on current med list, please assess. Also Failed protocol of labs within 180 days, has upcoming appt, please assess. Requested Prescriptions  Pending Prescriptions Disp Refills   cloNIDine (CATAPRES) 0.1 MG tablet [Pharmacy Med Name: CLONIDINE HCL 0.1 MG TAB] 60 tablet 11    Sig: TAKE ONE (1) TABLET BY MOUTH TWO TIMES PER DAY     Cardiovascular:  Alpha-2 Agonists Failed - 08/20/2021  2:44 PM      Failed - Last BP in normal range    BP Readings from Last 1 Encounters:  12/04/20 (!) 166/82          Passed - Last Heart Rate in normal range    Pulse Readings from Last 1 Encounters:  12/04/20 83          Passed - Valid encounter within last 6 months    Recent Outpatient Visits           5 months ago Right sided weakness   [facility] [name]., [facility]   10 months ago Left pontine cerebrovascular accident [facility])   [facility] [name]., MD   11 months ago Alpha Chrismon, Vickki Muff, PA-C   1 year ago Left pontine cerebrovascular accident [facility])   [facility] [name]., MD   1 year ago Obesity, morbid (more than 100 lbs over ideal weight or BMI > 40) ([place])   [facility] [name]., MD               losartan (COZAAR) 100 MG tablet [Pharmacy Med Name: LOSARTAN POTASSIUM 100 MG TAB] 90 tablet 1    Sig: TAKE ONE TABLET EVERY DAY BY MOUTH     Cardiovascular:  Angiotensin Receptor Blockers Failed - 08/20/2021  2:44 PM      Failed - Cr in normal range and within 180 days    Creatinine, Ser  Date Value Ref Range Status  12/02/2020 0.86 0.61 - 1.24 mg/dL Final          Failed - K in normal range and within 180 days    Potassium  Date Value  Ref Range Status  12/04/2020 3.3 (L) 3.5 - 5.1 mmol/L Final    Comment:    Performed at [facility], [address] [place], [place] [address]          Failed - Last BP in normal range    BP Readings from Last 1 Encounters:  12/04/20 (!) 166/82          Passed - Patient is not pregnant      Passed - Valid encounter within last 6 months    Recent Outpatient Visits           5 months ago Right sided weakness   [facility] [name]., [facility]   10 months ago Left pontine cerebrovascular accident [facility])   [facility] [name]., MD   11 months ago [place], PA-C   1 year ago Left pontine cerebrovascular accident [facility])   [facility] [name]., MD   1 year ago Obesity, morbid (more than 100 lbs over ideal weight  or BMI > 40) [facility])   [facility] [name]., MD

## 2021-08-21 ENCOUNTER — Encounter: Payer: Self-pay | Admitting: Family Medicine

## 2021-08-21 ENCOUNTER — Telehealth (INDEPENDENT_AMBULATORY_CARE_PROVIDER_SITE_OTHER): Payer: Medicare Other | Admitting: Family Medicine

## 2021-08-21 DIAGNOSIS — K7460 Unspecified cirrhosis of liver: Secondary | ICD-10-CM | POA: Diagnosis not present

## 2021-08-21 DIAGNOSIS — E1169 Type 2 diabetes mellitus with other specified complication: Secondary | ICD-10-CM

## 2021-08-21 DIAGNOSIS — C8333 Diffuse large B-cell lymphoma, intra-abdominal lymph nodes: Secondary | ICD-10-CM | POA: Diagnosis not present

## 2021-08-21 DIAGNOSIS — I700 Atherosclerosis of aorta: Secondary | ICD-10-CM | POA: Diagnosis not present

## 2021-08-21 DIAGNOSIS — I6930 Unspecified sequelae of cerebral infarction: Secondary | ICD-10-CM | POA: Diagnosis not present

## 2021-08-21 DIAGNOSIS — I152 Hypertension secondary to endocrine disorders: Secondary | ICD-10-CM | POA: Diagnosis not present

## 2021-08-21 DIAGNOSIS — E1159 Type 2 diabetes mellitus with other circulatory complications: Secondary | ICD-10-CM

## 2021-08-21 DIAGNOSIS — G8191 Hemiplegia, unspecified affecting right dominant side: Secondary | ICD-10-CM | POA: Diagnosis not present

## 2021-08-21 DIAGNOSIS — E785 Hyperlipidemia, unspecified: Secondary | ICD-10-CM | POA: Diagnosis not present

## 2021-08-21 NOTE — Telephone Encounter (Signed)
Per Dr. Rosanna Randy, we will schedule a mychart visit with patient for today. Patient was advised.

## 2021-08-21 NOTE — Progress Notes (Signed)
MyChart Video Visit    Virtual Visit via Video Note   This visit type was conducted due to national recommendations for restrictions regarding the COVID-19 Pandemic (e.g. social distancing) in an effort to limit this patient's exposure and mitigate transmission in our community. This patient is at least at moderate risk for complications without adequate follow up. This format is felt to be most appropriate for this patient at this time. Physical exam was limited by quality of the video and audio technology used for the visit.   Patient location: home Provider location: office  I discussed the limitations of evaluation and management by telemedicine and the availability of in person appointments. The patient expressed understanding and agreed to proceed. This was a telephone visit and not a telemedicine visit. Patient: Peter Ryan   DOB: 03-Feb-1951   70 y.o. Male  MRN: [id number] Visit Date: 08/21/2021  Today's healthcare provider: Wilhemena Durie, MD   No chief complaint on file.  Subjective    HPI  This is a face to face visit for Palliative Care.Confined to chair with right hemiparesis. This is a telephone visit.  Patient was unable to connect via the computer. He has right hemiparesis from previous stroke and is unable to ambulate whatsoever.  He is in a chair full-time in a unable to come to office visits or to go to the lab according to the patient.  He is interested in palliative care. He and his wife are getting ready to move from their home in [place] country.  He states the [place] the country is [place] to accommodate wheelchairs etc. Medications: Outpatient Medications Prior to Visit  Medication Sig   acetaminophen (TYLENOL) 325 MG tablet Take 2 tablets (650 mg total) by mouth every 4 (four) hours as needed for mild pain (or temp > 37.5 C (99.5 F)).   allopurinol (ZYLOPRIM) 300 MG tablet TAKE ONE TABLET EVERY DAY   amLODipine (NORVASC) 10 MG tablet TAKE  ONE TABLET BY MOUTH EVERY DAY   aspirin EC 325 MG EC tablet Take 1 tablet (325 mg total) by mouth daily.   atorvastatin (LIPITOR) 80 MG tablet TAKE ONE TABLET (80 MG) BY MOUTH EVERY DAY   Cholecalciferol (VITAMIN D3) 25 MCG (1000 UT) CAPS Take 1 capsule (1,000 Units total) by mouth daily.   cloNIDine (CATAPRES - DOSED IN MG/24 HR) 0.1 mg/24hr patch Place 1 patch (0.1 mg total) onto the skin once a week.   cloNIDine (CATAPRES) 0.1 MG tablet Take 1 tablet (0.1 mg total) by mouth 2 (two) times daily.   dapagliflozin propanediol (FARXIGA) 10 MG TABS tablet Take 1 tablet (10 mg total) by mouth daily.   finasteride (PROSCAR) 5 MG tablet Take 1 tablet (5 mg total) by mouth daily.   glimepiride (AMARYL) 2 MG tablet TAKE 1 TABLET BY MOUTH DAILY WITH BREAKFAST (Patient not taking: Reported on 03/15/2021)   hydrocortisone 2.5 % lotion Apply topically as directed. Apply to affected areas as needed up to three times weekly.   ketoconazole (NIZORAL) 2 % shampoo Apply 1 application topically as directed. Apply small amount every other day to affected areas. Let sit several minutes before rinsing.   losartan (COZAAR) 100 MG tablet TAKE ONE TABLET EVERY DAY BY MOUTH (Patient taking differently: Take 100 mg by mouth daily. TAKE ONE TABLET EVERY DAY BY MOUTH)   polyethylene glycol (MIRALAX / GLYCOLAX) 17 g packet Take 17 g by mouth daily.   vitamin B-12 (CYANOCOBALAMIN) 1000 MCG  tablet Take 1 tablet (1,000 mcg total) by mouth daily.   Facility-Administered Medications Prior to Visit  Medication Dose Route Frequency Provider   sodium chloride flush (NS) 0.9 % injection 10 mL  10 mL Intravenous PRN Sindy Guadeloupe, MD    Review of Systems     Objective    There were no vitals taken for this visit.   Physical Exam     Assessment & Plan     1. Right hemiparesis (HCC) Right hemiparesis secondary to old stroke.  He is homebound due to this.  Palliative care consult is pending and this is probably appropriate  there may be other services that are more appropriate.  We will allow palliative care and CCM to try to help patient and his wife with this decision.  2. History of stroke with residual deficit All risk factors for stroke treated  3. Hypertension associated with diabetes ([place])   4. Atherosclerosis of aorta ([place])   5. Cirrhosis of liver without ascites, unspecified hepatic cirrhosis type ([place]) Secondary to NASH  6. Hyperlipidemia associated with type 2 diabetes mellitus (HCC) Atorvastatin 80  7. 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 (HCC) Followed by oncology  8. Morbid obesity ([place]) Makes it more difficult to move the patient as he lives alone at home with his wife.   No follow-ups on file.     I discussed the assessment and treatment plan with the patient. The patient was provided an opportunity to ask questions and all were answered. The patient agreed with the plan and demonstrated an understanding of the instructions.   The patient was advised to call back or seek an in-person evaluation if the symptoms worsen or if the condition fails to improve as anticipated.  I provided 12  minutes of non-face-to-face time during this encounter.  I, Wilhemena Durie, MD, have reviewed all documentation for this visit. The documentation on 08/21/21 for the exam, diagnosis, procedures, and orders are all accurate and complete.   Peter Drennen Cranford Mon, MD [facility] [phone] (phone) [phone] (fax)  [place]

## 2021-08-22 ENCOUNTER — Telehealth: Payer: Self-pay | Admitting: Nurse Practitioner

## 2021-08-22 NOTE — Telephone Encounter (Signed)
Attempted to contact patient's wife [name] to schedule Palliative Consult, no answer, left message requesting a return call to schedule

## 2021-08-27 ENCOUNTER — Telehealth: Payer: Self-pay | Admitting: Family Medicine

## 2021-08-27 ENCOUNTER — Telehealth: Payer: Self-pay | Admitting: Student

## 2021-08-27 NOTE — Telephone Encounter (Signed)
Ret'd call to patient's wife [name], and discussed the Palliative referral/services with her and all questions were answered and she was in agreement with scheduling visit.  I have scheduled an In-home Consult for 09/06/21 @ 9 AM

## 2021-08-27 NOTE — Telephone Encounter (Signed)
Referral Request - Has patient seen PCP for this complaint? Yes.   *If NO, is insurance requiring patient see PCP for this issue before PCP can refer them? Referral for which specialty: HOME HEALTH/PT & OT Preferred provider/office: any that takes patient insurance Reason for referral: Pt had 2 strokes, unable to walk or use right hand

## 2021-08-28 NOTE — Telephone Encounter (Signed)
These orders have already been ordered.

## 2021-09-06 ENCOUNTER — Other Ambulatory Visit: Payer: Self-pay

## 2021-09-06 ENCOUNTER — Other Ambulatory Visit: Payer: Medicare Other | Admitting: Student

## 2021-09-06 DIAGNOSIS — I639 Cerebral infarction, unspecified: Secondary | ICD-10-CM

## 2021-09-06 DIAGNOSIS — G8191 Hemiplegia, unspecified affecting right dominant side: Secondary | ICD-10-CM | POA: Diagnosis not present

## 2021-09-06 DIAGNOSIS — Z515 Encounter for palliative care: Secondary | ICD-10-CM

## 2021-09-06 DIAGNOSIS — R110 Nausea: Secondary | ICD-10-CM | POA: Diagnosis not present

## 2021-09-06 DIAGNOSIS — R531 Weakness: Secondary | ICD-10-CM

## 2021-09-06 NOTE — Progress Notes (Signed)
Therapist, nutritional Palliative Care Consult Note Telephone: [phone]  Fax: [phone]   Date of encounter: 09/06/21 9:11 AM PATIENT NAME: Peter Ryan [address] [place] [address]   [phone] (home)  DOB: December 02, 1950 MRN: [id number] PRIMARY CARE PROVIDER:    Maple Hudson., MD,  [address] [place] [address] [phone]  REFERRING PROVIDER:   Maple Hudson., MD [address] [place],  [place] [address] [phone]  RESPONSIBLE PARTY:    Contact Information     Name Relation Home Work Mobile   Abram, Sax [phone]          I met face to face with patient and family in the home. Palliative Care was asked to follow this patient by consultation request of  Maple Hudson.,* to address advance care planning and complex medical decision making. This is the initial visit.                                     ASSESSMENT AND PLAN / RECOMMENDATIONS:   Advance Care Planning/Goals of Care: Goals include to maximize quality of life and symptom management. Patient/health care surrogate gave his/her permission to discuss.Our advance care planning conversation included a discussion about:    The value and importance of advance care planning  Experiences with loved ones who have been seriously ill or have died  Exploration of personal, cultural or spiritual beliefs that might influence medical decisions  Exploration of goals of care in the event of a sudden injury or illness  Identification and preparation of a healthcare agent  MOST form introduced.  Decision not to resuscitate or to de-escalate disease focused treatments due to poor prognosis. CODE STATUS: Full Code  Education provided on Palliative Medicine vs. Hospice services. Ongoing assessment for changes and declines. Palliative Medicine will continue to provide support.   Symptom Management/Plan:  Nausea- continue  natural/herbal patch for nausea, motion sickness PRN.   Generalized weakness-secondary to CVA. Patient request PT/OT/HHA. Recommend PT/OT to evaluate and treat due to  generalized weakness, difficulty ambulating, right sided hemiparesis, right hand decreased ROM. Used [facility] in the past.   Right sided hemiparesis-use lift for transfers. Wife/family to continue providing assistive care, assist with adl's. Patient encouraged to perform ROM to right hand. Monitor for aspiration, dysphagia; education provided on aspiration precautions.   Routine lab work-contact Dr. Sullivan Lone to see what lab work is needed.   Follow up Palliative Care Visit: Palliative care will continue to follow for complex medical decision making, advance care planning, and clarification of goals. Return in 8 weeks or prn.  I spent 60 minutes providing this consultation. More than 50% of the time in this consultation was spent in counseling and care coordination.    PPS: 40%  HOSPICE ELIGIBILITY/DIAGNOSIS: TBD  Chief Complaint: Palliative Medicine initial consult.   HISTORY OF PRESENT ILLNESS:  Peter Ryan is a 70 y.o. 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   Patient resides at home with wife. They will be moving by the end of month to another home, more handicap accessible. He denies pain, shortness of breath, constipation, nausea. Does get some motion sickness, last for a short period of time; uses a natural patch which he feels is helpful. Endorses right  sided weakness. Primarily in chair. Uses lift for transfers. Would like to  restart PT/OT. Wife feels some regression since he stopped therapy with [facility] a month ago. Patient was at [facility] for 3 months, up until June. Endorses a good appetite. Wife reports some coughing with liquids. He was on farxiga while at [facility];  discontinued. Does not check blood sugar in the home. Sleeping good. No falls. No recent infections, ED visits or hospitalizations.   History obtained from review of EMR, discussion with primary team, and interview with family, facility staff/caregiver and/or Mr. Peter Ryan.  I reviewed available labs, medications, imaging, studies and related documents from the EMR.  Records reviewed and summarized above.   ROS  General: NAD EYES: denies vision changes ENMT: cough with liquids Cardiovascular: denies chest pain, denies DOE Pulmonary: occasional  cough, denies increased SOB Abdomen: endorses good appetite, denies constipation GU: denies dysuria, endorses continence of urine MSK: weakness,  no falls reported Skin: denies rashes or wounds Neurological: denies pain, denies insomnia Psych: Endorses positive mood Heme/lymph/immuno: denies bruises, abnormal bleeding  Physical Exam:  Pulse 88, resp 16, b/p 138/80, sats 98% on room air Constitutional: NAD General: frail appearing, obese  EYES: anicteric sclera, lids intact, no discharge  ENMT: intact hearing, oral mucous membranes moist, dentition intact CV: S1S2, RRR, no LE edema Pulmonary: LCTA, no increased work of breathing, no cough, room air Abdomen: normo-active BS + 4 quadrants, soft and non tender, no ascites GU: deferred MSK: right sided weakness, non-ambulatory Skin: warm and dry, no rashes or wounds on visible skin Neuro:  generalized weakness, A & O x 3 Psych: non-anxious affect, pleasant Hem/lymph/immuno: no widespread bruising CURRENT PROBLEM LIST:  Patient Active Problem List   Diagnosis Date Noted   TIA (transient ischemic attack) 12/01/2020   Right sided weakness 11/30/2020   Hypertension associated with diabetes (HCC) 11/30/2020   History of stroke with residual deficit 11/30/2020   Left pontine cerebrovascular accident (HCC) 06/14/2020   Facial droop    Rectal bleeding    Morbid obesity (HCC)    Right  hemiparesis (HCC)    Controlled type 2 diabetes mellitus with hyperglycemia, without long-term current use of insulin (HCC)    Acute CVA (cerebrovascular accident) (HCC) 06/05/2020   Obesity, Class III, BMI 40-49.9 (morbid obesity) (HCC) 06/05/2020   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) 05/02/2020   Atherosclerosis of aorta (HCC) 05/02/2020   Goals of care, counseling/discussion 04/08/2017   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 (HCC) 04/08/2017   Liver cirrhosis (HCC) 02/08/2017   SBP (spontaneous bacterial peritonitis) (HCC) 02/07/2017   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   DOE (dyspnea on exertion) 11/15/2016   Obesity, morbid (more than 100 lbs over ideal weight or BMI > 40) (HCC) 11/15/2016   Elevated PSA 05/30/2015   BPH with obstruction/lower urinary tract symptoms 05/30/2015   Type 2 diabetes mellitus (HCC) 05/18/2014   Arthritis 01/03/2014   Hyperlipidemia associated with type 2 diabetes mellitus (HCC) 01/03/2014   Benign essential hypertension 01/03/2014   Gout 01/03/2014   Psoriasis 01/03/2014   Reflux 01/03/2014   PAST MEDICAL HISTORY:  Active Ambulatory Problems    Diagnosis Date Noted   Elevated PSA 05/30/2015   BPH with obstruction/lower urinary tract symptoms 05/30/2015   Type 2 diabetes mellitus (HCC) 05/18/2014   Arthritis 01/03/2014   Hyperlipidemia associated with type 2 diabetes mellitus (HCC) 01/03/2014   Benign essential hypertension 01/03/2014   Gout 01/03/2014  Psoriasis 01/03/2014   Reflux 01/03/2014   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   DOE (dyspnea on exertion) 11/15/2016   Obesity, morbid (more than 100 lbs over ideal weight or BMI > 40) ([place]) 11/15/2016   SBP (spontaneous bacterial peritonitis) ([place]) 02/07/2017   Liver cirrhosis ([place]) 02/08/2017   Goals of care, counseling/discussion 04/08/2017   Diffuse large B-cell lymphoma of  intra-abdominal lymph nodes ([place]) 04/08/2017   Secondary malignant neoplasm of retroperitoneum and peritoneum ([place]) 05/02/2020   Atherosclerosis of aorta ([place]) 05/02/2020   Acute CVA (cerebrovascular accident) ([place]) 06/05/2020   Obesity, Class III, BMI 40-49.9 (morbid obesity) ([place]) 06/05/2020   Left pontine cerebrovascular accident ([place]) 06/14/2020   Facial droop    Rectal bleeding    Morbid obesity ([place])    Right hemiparesis ([place])    Controlled type 2 diabetes mellitus with hyperglycemia, without long-term current use of insulin ([place])    Right sided weakness 11/30/2020   Hypertension associated with diabetes ([place]) 11/30/2020   History of stroke with residual deficit 11/30/2020   TIA (transient ischemic attack) 12/01/2020   Resolved Ambulatory Problems    Diagnosis Date Noted   Ascites 02/08/2017   Past Medical History:  Diagnosis Date   BPH (benign prostatic hyperplasia)    Cancer (HCC)    Chronic prostatitis    Cirrhosis of liver (HCC)    Diabetes mellitus without complication (HCC)    Dyslipidemia    Gastric reflux    HTN (hypertension)    Hyperlipidemia    Neuropathy    Over weight    Testicular pain, right    Urinary frequency    SOCIAL HX:  Social History   Tobacco Use   Smoking status: Former    Packs/day: 1.50    Years: 8.00    Pack years: 12.00    Types: Cigarettes    Quit date: 05/29/1974    Years since quitting: 47.3   Smokeless tobacco: Never   Tobacco comments:    quit 40 years ago  Substance Use Topics   Alcohol use: No    Alcohol/week: 0.0 standard drinks    Comment: rarely   FAMILY HX:  Family History  Problem Relation Age of Onset   Uterine cancer Mother    Diabetes Mother    Cancer Mother    Cancer Maternal Uncle    Bladder Cancer Neg Hx    Kidney disease Neg Hx    Prostate cancer Neg Hx      ALLERGIES:  Allergies  Allergen Reactions   Hctz [Hydrochlorothiazide] Other (See Comments)    Gout flare   Metformin Other (See  Comments)    Stomach upset      PERTINENT MEDICATIONS:  Outpatient Encounter Medications as of 09/06/2021  Medication Sig   acetaminophen (TYLENOL) 325 MG tablet Take 2 tablets (650 mg total) by mouth every 4 (four) hours as needed for mild pain (or temp > 37.5 C (99.5 F)).   allopurinol (ZYLOPRIM) 300 MG tablet TAKE ONE TABLET EVERY DAY   amLODipine (NORVASC) 10 MG tablet TAKE ONE TABLET BY MOUTH EVERY DAY   aspirin EC 325 MG EC tablet Take 1 tablet (325 mg total) by mouth daily.   atorvastatin (LIPITOR) 80 MG tablet TAKE ONE TABLET (80 MG) BY MOUTH EVERY DAY   Cholecalciferol (VITAMIN D3) 25 MCG (1000 UT) CAPS Take 1 capsule (1,000 Units total) by mouth daily.   cloNIDine (CATAPRES) 0.1 MG tablet TAKE ONE (1) TABLET BY MOUTH TWO TIMES PER DAY  dapagliflozin propanediol (FARXIGA) 10 MG TABS tablet Take 1 tablet (10 mg total) by mouth daily. (Patient not taking: Reported on 08/21/2021)   finasteride (PROSCAR) 5 MG tablet Take 1 tablet (5 mg total) by mouth daily.   hydrocortisone 2.5 % lotion Apply topically as directed. Apply to affected areas as needed up to three times weekly.   ketoconazole (NIZORAL) 2 % shampoo Apply 1 application topically as directed. Apply small amount every other day to affected areas. Let sit several minutes before rinsing. (Patient not taking: Reported on 08/21/2021)   losartan (COZAAR) 100 MG tablet TAKE ONE TABLET EVERY DAY BY MOUTH   polyethylene glycol (MIRALAX / GLYCOLAX) 17 g packet Take 17 g by mouth daily.   vitamin B-12 (CYANOCOBALAMIN) 1000 MCG tablet Take 1 tablet (1,000 mcg total) by mouth daily.   Facility-Administered Encounter Medications as of 09/06/2021  Medication   sodium chloride flush (NS) 0.9 % injection 10 mL   Thank you for the opportunity to participate in the care of Mr. Schroepfer.  The palliative care team will continue to follow. Please call our office at [phone] if we can be of additional assistance.   Ezekiel Slocumb, NP    COVID-19 PATIENT SCREENING TOOL Asked and negative response unless otherwise noted:  Have you had symptoms of covid, tested positive or been in contact with someone with symptoms/positive test in the past 5-10 days? No

## 2021-09-07 ENCOUNTER — Telehealth: Payer: Self-pay

## 2021-09-07 NOTE — Telephone Encounter (Signed)
RN called [facility] home health to verify home health order received from Epic yesterday. Left message for clinical manager to call me back to verify. Notified palliative team

## 2021-09-10 ENCOUNTER — Telehealth: Payer: Self-pay

## 2021-09-10 NOTE — Telephone Encounter (Signed)
Please review for Dr. Gilbert  Thanks,   -Kasper Mudrick  

## 2021-09-10 NOTE — Telephone Encounter (Signed)
425 pm.  Phone calls made to patient and spouse to schedule a home visit to obtain blood work as ordered by Dr. Caryn Section.   No answer on either phone.  Message has been left requesting a call back.   Incoming call at 430 pm from [place].  Advised of blood work ordered by Dr. Caryn Section.  Visit scheduled for Monday at 11 am.

## 2021-09-10 NOTE — Telephone Encounter (Signed)
Needs cbc, met C, and hgbA1c.

## 2021-09-10 NOTE — Telephone Encounter (Signed)
Copied from [place] [phone]. Topic: General - Other >> Sep 10, 2021  8:50 AM Ivar Drape wrote: Reason for CRM: Almyra Free from [facility] would like to know if Dr. Rosanna Randy would like the patient to have bloodwork.

## 2021-09-10 NOTE — Telephone Encounter (Signed)
830 am.  Message received from [facility], NP for Palliative Care.  PCP is in need of routine blood work but patient is homebound.  Phone call made to PCP office to follow up on blood work that is needed.   Message left with receptionist and awaiting call back from PCP nurse.

## 2021-09-12 NOTE — Telephone Encounter (Signed)
Vance Gather, RN is aware of Dr. Maralyn Sago orders. Per her telephone message from 09/10/2021, patient has a home visit scheduled with Almyra Free on Monday 09/17/2021 at 11am  to obtain labs.

## 2021-09-17 ENCOUNTER — Other Ambulatory Visit: Payer: Self-pay

## 2021-09-17 ENCOUNTER — Other Ambulatory Visit: Payer: Medicare Other

## 2021-09-17 DIAGNOSIS — Z515 Encounter for palliative care: Secondary | ICD-10-CM

## 2021-09-17 NOTE — Progress Notes (Signed)
11 am.  Into see patient to obtain blood work ordered by Dr. Caryn Section.  2 attempts tried to the left [facility] and hand.  Neither attempt was successful. Patient advised I would contact my co-worker to see if she can make a visit to obtain blood work.   Tiburcio Pea, [place] notified and will contact patient regarding a home visit to obtain blood work.

## 2021-09-21 ENCOUNTER — Telehealth: Payer: Self-pay | Admitting: Family Medicine

## 2021-09-21 DIAGNOSIS — Z8572 Personal history of non-Hodgkin lymphomas: Secondary | ICD-10-CM | POA: Diagnosis not present

## 2021-09-21 DIAGNOSIS — Z9181 History of falling: Secondary | ICD-10-CM | POA: Diagnosis not present

## 2021-09-21 DIAGNOSIS — E1169 Type 2 diabetes mellitus with other specified complication: Secondary | ICD-10-CM | POA: Diagnosis not present

## 2021-09-21 DIAGNOSIS — I152 Hypertension secondary to endocrine disorders: Secondary | ICD-10-CM | POA: Diagnosis not present

## 2021-09-21 DIAGNOSIS — E1151 Type 2 diabetes mellitus with diabetic peripheral angiopathy without gangrene: Secondary | ICD-10-CM | POA: Diagnosis not present

## 2021-09-21 DIAGNOSIS — E1159 Type 2 diabetes mellitus with other circulatory complications: Secondary | ICD-10-CM | POA: Diagnosis not present

## 2021-09-21 DIAGNOSIS — Z6841 Body Mass Index (BMI) 40.0 and over, adult: Secondary | ICD-10-CM | POA: Diagnosis not present

## 2021-09-21 DIAGNOSIS — E785 Hyperlipidemia, unspecified: Secondary | ICD-10-CM | POA: Diagnosis not present

## 2021-09-21 DIAGNOSIS — K7581 Nonalcoholic steatohepatitis (NASH): Secondary | ICD-10-CM | POA: Diagnosis not present

## 2021-09-21 DIAGNOSIS — I69392 Facial weakness following cerebral infarction: Secondary | ICD-10-CM | POA: Diagnosis not present

## 2021-09-21 DIAGNOSIS — M1990 Unspecified osteoarthritis, unspecified site: Secondary | ICD-10-CM | POA: Diagnosis not present

## 2021-09-21 DIAGNOSIS — K7460 Unspecified cirrhosis of liver: Secondary | ICD-10-CM | POA: Diagnosis not present

## 2021-09-21 DIAGNOSIS — N401 Enlarged prostate with lower urinary tract symptoms: Secondary | ICD-10-CM | POA: Diagnosis not present

## 2021-09-21 DIAGNOSIS — L409 Psoriasis, unspecified: Secondary | ICD-10-CM | POA: Diagnosis not present

## 2021-09-21 DIAGNOSIS — Z87891 Personal history of nicotine dependence: Secondary | ICD-10-CM | POA: Diagnosis not present

## 2021-09-21 DIAGNOSIS — N138 Other obstructive and reflux uropathy: Secondary | ICD-10-CM | POA: Diagnosis not present

## 2021-09-21 DIAGNOSIS — I700 Atherosclerosis of aorta: Secondary | ICD-10-CM | POA: Diagnosis not present

## 2021-09-21 DIAGNOSIS — I69351 Hemiplegia and hemiparesis following cerebral infarction affecting right dominant side: Secondary | ICD-10-CM | POA: Diagnosis not present

## 2021-09-21 DIAGNOSIS — M109 Gout, unspecified: Secondary | ICD-10-CM | POA: Diagnosis not present

## 2021-09-21 DIAGNOSIS — Z7982 Long term (current) use of aspirin: Secondary | ICD-10-CM | POA: Diagnosis not present

## 2021-09-21 DIAGNOSIS — K219 Gastro-esophageal reflux disease without esophagitis: Secondary | ICD-10-CM | POA: Diagnosis not present

## 2021-09-21 DIAGNOSIS — E1142 Type 2 diabetes mellitus with diabetic polyneuropathy: Secondary | ICD-10-CM | POA: Diagnosis not present

## 2021-09-21 DIAGNOSIS — D472 Monoclonal gammopathy: Secondary | ICD-10-CM | POA: Diagnosis not present

## 2021-09-21 NOTE — Telephone Encounter (Signed)
Copied from [place] [phone] [phone]. Topic: Quick Communication - Home Health Verbal Orders >> Sep 21, 2021  1:35 PM Yvette Rack wrote: Caller/Agency: Soni with [facility] Callback Number: [phone] Requesting OT/PT/Skilled Nursing/Social Work/Speech Therapy: PT Frequency: 1 time a week for 7 weeks

## 2021-09-25 ENCOUNTER — Telehealth: Payer: Self-pay

## 2021-09-25 DIAGNOSIS — I69351 Hemiplegia and hemiparesis following cerebral infarction affecting right dominant side: Secondary | ICD-10-CM | POA: Diagnosis not present

## 2021-09-25 DIAGNOSIS — I69392 Facial weakness following cerebral infarction: Secondary | ICD-10-CM | POA: Diagnosis not present

## 2021-09-25 DIAGNOSIS — E1142 Type 2 diabetes mellitus with diabetic polyneuropathy: Secondary | ICD-10-CM | POA: Diagnosis not present

## 2021-09-25 DIAGNOSIS — E1159 Type 2 diabetes mellitus with other circulatory complications: Secondary | ICD-10-CM | POA: Diagnosis not present

## 2021-09-25 DIAGNOSIS — I152 Hypertension secondary to endocrine disorders: Secondary | ICD-10-CM | POA: Diagnosis not present

## 2021-09-25 DIAGNOSIS — M109 Gout, unspecified: Secondary | ICD-10-CM | POA: Diagnosis not present

## 2021-09-25 NOTE — Telephone Encounter (Signed)
That's fine

## 2021-09-25 NOTE — Telephone Encounter (Signed)
Soni advised as below.

## 2021-09-25 NOTE — Telephone Encounter (Signed)
Peter Ryan from [facility] requesting OT for 1 x week  for 6 weeks ADL's, Increasing function RUE.

## 2021-09-25 NOTE — Telephone Encounter (Signed)
Please advise 

## 2021-09-25 NOTE — Telephone Encounter (Signed)
Left detailed message for stephanie as below.

## 2021-09-25 NOTE — Telephone Encounter (Signed)
Copied from [place] [id number]. Topic: Quick Communication - Home Health Verbal Orders >> Sep 21, 2021  1:35 PM Yvette Rack wrote: Caller/Agency: Soni with [facility] Callback Number: [phone] Requesting OT/PT/Skilled Nursing/Social Work/Speech Therapy: PT Frequency: 1 time a week for 7 weeks

## 2021-10-03 DIAGNOSIS — E1159 Type 2 diabetes mellitus with other circulatory complications: Secondary | ICD-10-CM | POA: Diagnosis not present

## 2021-10-03 DIAGNOSIS — I69392 Facial weakness following cerebral infarction: Secondary | ICD-10-CM | POA: Diagnosis not present

## 2021-10-03 DIAGNOSIS — I69351 Hemiplegia and hemiparesis following cerebral infarction affecting right dominant side: Secondary | ICD-10-CM | POA: Diagnosis not present

## 2021-10-03 DIAGNOSIS — M109 Gout, unspecified: Secondary | ICD-10-CM | POA: Diagnosis not present

## 2021-10-03 DIAGNOSIS — I152 Hypertension secondary to endocrine disorders: Secondary | ICD-10-CM | POA: Diagnosis not present

## 2021-10-03 DIAGNOSIS — E1142 Type 2 diabetes mellitus with diabetic polyneuropathy: Secondary | ICD-10-CM | POA: Diagnosis not present

## 2021-10-05 DIAGNOSIS — I152 Hypertension secondary to endocrine disorders: Secondary | ICD-10-CM | POA: Diagnosis not present

## 2021-10-05 DIAGNOSIS — I69351 Hemiplegia and hemiparesis following cerebral infarction affecting right dominant side: Secondary | ICD-10-CM | POA: Diagnosis not present

## 2021-10-05 DIAGNOSIS — E1142 Type 2 diabetes mellitus with diabetic polyneuropathy: Secondary | ICD-10-CM | POA: Diagnosis not present

## 2021-10-05 DIAGNOSIS — M109 Gout, unspecified: Secondary | ICD-10-CM | POA: Diagnosis not present

## 2021-10-05 DIAGNOSIS — I69392 Facial weakness following cerebral infarction: Secondary | ICD-10-CM | POA: Diagnosis not present

## 2021-10-05 DIAGNOSIS — E1159 Type 2 diabetes mellitus with other circulatory complications: Secondary | ICD-10-CM | POA: Diagnosis not present

## 2021-10-08 DIAGNOSIS — E1159 Type 2 diabetes mellitus with other circulatory complications: Secondary | ICD-10-CM | POA: Diagnosis not present

## 2021-10-08 DIAGNOSIS — I69351 Hemiplegia and hemiparesis following cerebral infarction affecting right dominant side: Secondary | ICD-10-CM | POA: Diagnosis not present

## 2021-10-08 DIAGNOSIS — I69392 Facial weakness following cerebral infarction: Secondary | ICD-10-CM | POA: Diagnosis not present

## 2021-10-08 DIAGNOSIS — M109 Gout, unspecified: Secondary | ICD-10-CM | POA: Diagnosis not present

## 2021-10-08 DIAGNOSIS — E1142 Type 2 diabetes mellitus with diabetic polyneuropathy: Secondary | ICD-10-CM | POA: Diagnosis not present

## 2021-10-08 DIAGNOSIS — I152 Hypertension secondary to endocrine disorders: Secondary | ICD-10-CM | POA: Diagnosis not present

## 2021-10-10 DIAGNOSIS — I69392 Facial weakness following cerebral infarction: Secondary | ICD-10-CM | POA: Diagnosis not present

## 2021-10-10 DIAGNOSIS — I69351 Hemiplegia and hemiparesis following cerebral infarction affecting right dominant side: Secondary | ICD-10-CM | POA: Diagnosis not present

## 2021-10-10 DIAGNOSIS — E1159 Type 2 diabetes mellitus with other circulatory complications: Secondary | ICD-10-CM | POA: Diagnosis not present

## 2021-10-10 DIAGNOSIS — E1142 Type 2 diabetes mellitus with diabetic polyneuropathy: Secondary | ICD-10-CM | POA: Diagnosis not present

## 2021-10-10 DIAGNOSIS — I152 Hypertension secondary to endocrine disorders: Secondary | ICD-10-CM | POA: Diagnosis not present

## 2021-10-10 DIAGNOSIS — M109 Gout, unspecified: Secondary | ICD-10-CM | POA: Diagnosis not present

## 2021-10-12 DIAGNOSIS — E1159 Type 2 diabetes mellitus with other circulatory complications: Secondary | ICD-10-CM | POA: Diagnosis not present

## 2021-10-12 DIAGNOSIS — E1142 Type 2 diabetes mellitus with diabetic polyneuropathy: Secondary | ICD-10-CM | POA: Diagnosis not present

## 2021-10-12 DIAGNOSIS — M109 Gout, unspecified: Secondary | ICD-10-CM | POA: Diagnosis not present

## 2021-10-12 DIAGNOSIS — I152 Hypertension secondary to endocrine disorders: Secondary | ICD-10-CM | POA: Diagnosis not present

## 2021-10-12 DIAGNOSIS — I69392 Facial weakness following cerebral infarction: Secondary | ICD-10-CM | POA: Diagnosis not present

## 2021-10-12 DIAGNOSIS — I69351 Hemiplegia and hemiparesis following cerebral infarction affecting right dominant side: Secondary | ICD-10-CM | POA: Diagnosis not present

## 2021-10-15 DIAGNOSIS — I152 Hypertension secondary to endocrine disorders: Secondary | ICD-10-CM | POA: Diagnosis not present

## 2021-10-15 DIAGNOSIS — I69392 Facial weakness following cerebral infarction: Secondary | ICD-10-CM | POA: Diagnosis not present

## 2021-10-15 DIAGNOSIS — M109 Gout, unspecified: Secondary | ICD-10-CM | POA: Diagnosis not present

## 2021-10-15 DIAGNOSIS — E1159 Type 2 diabetes mellitus with other circulatory complications: Secondary | ICD-10-CM | POA: Diagnosis not present

## 2021-10-15 DIAGNOSIS — E1142 Type 2 diabetes mellitus with diabetic polyneuropathy: Secondary | ICD-10-CM | POA: Diagnosis not present

## 2021-10-15 DIAGNOSIS — I69351 Hemiplegia and hemiparesis following cerebral infarction affecting right dominant side: Secondary | ICD-10-CM | POA: Diagnosis not present

## 2021-10-16 DIAGNOSIS — I69392 Facial weakness following cerebral infarction: Secondary | ICD-10-CM | POA: Diagnosis not present

## 2021-10-16 DIAGNOSIS — E1159 Type 2 diabetes mellitus with other circulatory complications: Secondary | ICD-10-CM | POA: Diagnosis not present

## 2021-10-16 DIAGNOSIS — I69351 Hemiplegia and hemiparesis following cerebral infarction affecting right dominant side: Secondary | ICD-10-CM | POA: Diagnosis not present

## 2021-10-16 DIAGNOSIS — E1142 Type 2 diabetes mellitus with diabetic polyneuropathy: Secondary | ICD-10-CM | POA: Diagnosis not present

## 2021-10-16 DIAGNOSIS — I152 Hypertension secondary to endocrine disorders: Secondary | ICD-10-CM | POA: Diagnosis not present

## 2021-10-16 DIAGNOSIS — M109 Gout, unspecified: Secondary | ICD-10-CM | POA: Diagnosis not present

## 2021-10-17 ENCOUNTER — Ambulatory Visit (INDEPENDENT_AMBULATORY_CARE_PROVIDER_SITE_OTHER): Payer: Medicare Other

## 2021-10-17 ENCOUNTER — Telehealth: Payer: Self-pay | Admitting: Family Medicine

## 2021-10-17 DIAGNOSIS — I69392 Facial weakness following cerebral infarction: Secondary | ICD-10-CM | POA: Diagnosis not present

## 2021-10-17 DIAGNOSIS — E1142 Type 2 diabetes mellitus with diabetic polyneuropathy: Secondary | ICD-10-CM | POA: Diagnosis not present

## 2021-10-17 DIAGNOSIS — Z0000 Encounter for general adult medical examination without abnormal findings: Secondary | ICD-10-CM

## 2021-10-17 DIAGNOSIS — I69351 Hemiplegia and hemiparesis following cerebral infarction affecting right dominant side: Secondary | ICD-10-CM | POA: Diagnosis not present

## 2021-10-17 DIAGNOSIS — I152 Hypertension secondary to endocrine disorders: Secondary | ICD-10-CM | POA: Diagnosis not present

## 2021-10-17 DIAGNOSIS — M109 Gout, unspecified: Secondary | ICD-10-CM | POA: Diagnosis not present

## 2021-10-17 DIAGNOSIS — E1159 Type 2 diabetes mellitus with other circulatory complications: Secondary | ICD-10-CM | POA: Diagnosis not present

## 2021-10-17 NOTE — Progress Notes (Signed)
Virtual Visit via Telephone Note  I connected with  Peter Ryan on 10/17/21 at  2:20 PM EST by telephone and verified that I am speaking with the correct person using two identifiers.  Location: Patient: home Provider: [facility] Persons participating in the virtual visit: [name]   I discussed the limitations, risks, security and privacy concerns of performing an evaluation and management service by telephone and the availability of in person appointments. The patient expressed understanding and agreed to proceed.  Interactive audio and video telecommunications were attempted between this nurse and patient, however failed, due to patient having technical difficulties OR patient did not have access to video capability.  We continued and completed visit with audio only.  Some vital signs may be absent or patient reported.   Peter David, LPN  Subjective:   Peter Ryan is a 71 y.o. male who presents for Medicare Annual/Subsequent preventive examination.  Review of Systems           Objective:    There were no vitals filed for this visit. There is no height or weight on file to calculate BMI.  Advanced Directives 11/30/2020 10/11/2020 08/28/2020 06/14/2020 06/09/2020 06/05/2020 06/05/2020  Does Patient Have a Medical Advance Directive? No No No No No No No  Would patient like information on creating a medical advance directive? No - Patient declined No - Patient declined - No - Guardian declined - No - Guardian declined No - Patient declined    Current Medications (verified) Outpatient Encounter Medications as of 10/17/2021  Medication Sig   acetaminophen (TYLENOL) 325 MG tablet Take 2 tablets (650 mg total) by mouth every 4 (four) hours as needed for mild pain (or temp > 37.5 C (99.5 F)).   allopurinol (ZYLOPRIM) 300 MG tablet TAKE ONE TABLET EVERY DAY   amLODipine (NORVASC) 10 MG tablet TAKE ONE TABLET BY MOUTH EVERY DAY   aspirin EC 325 MG EC tablet Take 1  tablet (325 mg total) by mouth daily.   atorvastatin (LIPITOR) 80 MG tablet TAKE ONE TABLET (80 MG) BY MOUTH EVERY DAY   Cholecalciferol (VITAMIN D3) 25 MCG (1000 UT) CAPS Take 1 capsule (1,000 Units total) by mouth daily.   cloNIDine (CATAPRES) 0.1 MG tablet TAKE ONE (1) TABLET BY MOUTH TWO TIMES PER DAY   dapagliflozin propanediol (FARXIGA) 10 MG TABS tablet Take 1 tablet (10 mg total) by mouth daily. (Patient not taking: Reported on 08/21/2021)   finasteride (PROSCAR) 5 MG tablet Take 1 tablet (5 mg total) by mouth daily.   hydrocortisone 2.5 % lotion Apply topically as directed. Apply to affected areas as needed up to three times weekly.   ketoconazole (NIZORAL) 2 % shampoo Apply 1 application topically as directed. Apply small amount every other day to affected areas. Let sit several minutes before rinsing.   losartan (COZAAR) 100 MG tablet TAKE ONE TABLET EVERY DAY BY MOUTH   polyethylene glycol (MIRALAX / GLYCOLAX) 17 g packet Take 17 g by mouth daily.   vitamin B-12 (CYANOCOBALAMIN) 1000 MCG tablet Take 1 tablet (1,000 mcg total) by mouth daily.   Facility-Administered Encounter Medications as of 10/17/2021  Medication   sodium chloride flush (NS) 0.9 % injection 10 mL    Allergies (verified) Hctz [hydrochlorothiazide] and Metformin   History: Past Medical History:  Diagnosis Date   Arthritis    BPH (benign prostatic hyperplasia)    Cancer (HCC)    Non-Hodgkins lymphoma   Chronic prostatitis    Cirrhosis of liver ([place])  Diabetes mellitus without complication (HCC)    Dyslipidemia    Elevated PSA    Gastric reflux    Gout    HTN (hypertension)    Hyperlipidemia    Neuropathy    Over weight    Psoriasis    Testicular pain, right    Urinary frequency    Past Surgical History:  Procedure Laterality Date   hydrocelectomy     IR FLUORO GUIDE PORT INSERTION RIGHT  04/11/2017   IR REMOVAL TUN ACCESS W/ PORT W/O FL MOD SED  05/29/2018   PILONIDAL CYST EXCISION     TEE  WITHOUT CARDIOVERSION N/A 06/09/2020   Procedure: TRANSESOPHAGEAL ECHOCARDIOGRAM (TEE);  Surgeon: Minna Merritts, MD;  Location: [facility] ORS;  Service: Cardiovascular;  Laterality: N/A;   Family History  Problem Relation Age of Onset   Uterine cancer Mother    Diabetes Mother    Cancer Mother    Cancer Maternal Uncle    Bladder Cancer Neg Hx    Kidney disease Neg Hx    Prostate cancer Neg Hx    Social History   Socioeconomic History   Marital status: Married    Spouse name: Peter Ryan   Number of children: 0   Years of education: Not on file   Highest education level: Some college, no degree  Occupational History   Occupation: retired  Tobacco Use   Smoking status: Former    Packs/day: 1.50    Years: 8.00    Pack years: 12.00    Types: Cigarettes    Quit date: 05/29/1974    Years since quitting: 47.4   Smokeless tobacco: Never   Tobacco comments:    quit 40 years ago  Vaping Use   Vaping Use: Never used  Substance and Sexual Activity   Alcohol use: No    Alcohol/week: 0.0 standard drinks    Comment: rarely   Drug use: No   Sexual activity: Not on file  Other Topics Concern   Not on file  Social History Narrative   Lives with wife   Right handed   Drinks 1-2 cups coffee   Social Determinants of Health   Financial Resource Strain: Not on file  Food Insecurity: Not on file  Transportation Needs: Not on file  Physical Activity: Not on file  Stress: Not on file  Social Connections: Not on file    Tobacco Counseling Counseling given: Not Answered Tobacco comments: quit 40 years ago   Clinical Intake:  Pre-visit preparation completed: Yes  Pain : No/denies pain     Nutritional Risks: None Diabetes: Yes CBG done?: No Did pt. bring in CBG monitor from home?: No  How often do you need to have someone help you when you read instructions, pamphlets, or other written materials from your doctor or pharmacy?: 1 - Never  Diabetic?yes Nutrition Risk  Assessment:  Has the patient had any N/V/D within the last 2 months?  No  Does the patient have any non-healing wounds?  No  Has the patient had any unintentional weight loss or weight gain?  No   Diabetes:  Is the patient diabetic?  Yes  If diabetic, was a CBG obtained today?  No  Did the patient bring in their glucometer from home?  No  How often do you monitor your CBG's? none.   Financial Strains and Diabetes Management:  Are you having any financial strains with the device, your supplies or your medication? No .  Does the patient want to be  seen by Chronic Care Management for management of their diabetes?  No  Would the patient like to be referred to a Nutritionist or for Diabetic Management?  No   Diabetic Exams:  Diabetic Eye Exam: Completed 01/10/20. Overdue for diabetic eye exam. Pt has been advised about the importance in completing this exam. - declined referral  Diabetic Foot Exam: Completed 06/26/16. Pt has been advised about the importance in completing this exam.   Interpreter Needed?: No  Information entered by :: Kirke Shaggy, LPN   Activities of Daily Living In your present state of health, do you have any difficulty performing the following activities: 11/30/2020  Hearing? N  Vision? N  Difficulty concentrating or making decisions? N  Walking or climbing stairs? N  Dressing or bathing? Y  Doing errands, shopping? N  Some recent data might be hidden    Patient Care Team: [name]., MD as PCP - General (Family Medicine) [name], MD as Consulting Physician (Pulmonary Disease) Sindy Guadeloupe, MD as Consulting Physician (Oncology) Cleaster Corin, OD (Optometry) Laneta Simmers as Physician Assistant (Urology) Jonathon Bellows, MD as Consulting Physician (Gastroenterology) Vern Claude, LCSW as Social Worker  Indicate any recent Medical Services you may have received from other than [facility] providers in the past year (date may be  approximate).     Assessment:   This is a routine wellness examination for Peter Ryan.  Hearing/Vision screen No results found.  Dietary issues and exercise activities discussed:     Goals Addressed   None    Depression Screen PHQ 2/9 Scores 10/11/2020 08/02/2020 07/20/2020 05/02/2020 01/29/2018 12/02/2016 08/22/2015  PHQ - 2 Score 0 0 2 0 0 3 0  PHQ- 9 Score - 0 3 0 5 4 -    Fall Risk Fall Risk  10/11/2020 08/02/2020 07/20/2020 05/02/2020 08/01/2017  Falls in the past year? 1 1 1 1  No  Number falls in past yr: 1 1 0 0 -  Injury with Fall? 1 0 0 0 -  Risk for fall due to : Impaired mobility;Other (Comment) - - - -  Risk for fall due to: Comment Has right sided weakness due to stroke. - - - -  Follow up Falls prevention discussed Falls evaluation completed - - -    FALL RISK PREVENTION PERTAINING TO THE HOME:  Any stairs in or around the home? Yes  If so, are there any without handrails? No  Home free of loose throw rugs in walkways, pet beds, electrical cords, etc? Yes  Adequate lighting in your home to reduce risk of falls? Yes   ASSISTIVE DEVICES UTILIZED TO PREVENT FALLS:  Life alert? No  Use of a cane, walker or w/c? Yes  Grab bars in the bathroom? Yes  Shower chair or bench in shower? Yes  Elevated toilet seat or a handicapped toilet? Yes    Cognitive Function:Normal cognitive status assessed by direct observation by this Nurse Health Advisor. No abnormalities found.          Immunizations Immunization History  Administered Date(s) Administered   Fluad Quad(high Dose 65+) 08/02/2020   Influenza,inj,Quad PF,6+ Mos 08/20/2017   Moderna Sars-Covid-2 Vaccination 12/03/2019, 12/31/2019, 09/26/2020   Tdap 11/21/2014   Zoster Recombinat (Shingrix) 10/30/2016, 02/21/2017    TDAP status: Up to date  Flu Vaccine status: Up to date  Pneumococcal vaccine status: Declined,  Education has been provided regarding the importance of this vaccine but patient still declined.  Advised may receive this vaccine  at local pharmacy or Health Dept. Aware to provide a copy of the vaccination record if obtained from local pharmacy or Health Dept. Verbalized acceptance and understanding.   Covid-19 vaccine status: Completed vaccines  Qualifies for Shingles Vaccine? Yes   Zostavax completed No   Shingrix Completed?: Yes  Screening Tests Health Maintenance  Topic Date Due   Pneumonia Vaccine 62+ Years old (1 - PCV) Never done   COLONOSCOPY (Pts 45-73yrs Insurance coverage will need to be confirmed)  09/12/2015   FOOT EXAM  06/26/2017   COVID-19 Vaccine (4 - Booster for Moderna series) 11/21/2020   OPHTHALMOLOGY EXAM  01/09/2021   INFLUENZA VACCINE  04/30/2021   HEMOGLOBIN A1C  06/03/2021   TETANUS/TDAP  11/21/2024   Hepatitis C Screening  Completed   Zoster Vaccines- Shingrix  Completed   HPV VACCINES  Aged Out    Health Maintenance  Health Maintenance Due  Topic Date Due   Pneumonia Vaccine 48+ Years old (1 - PCV) Never done   COLONOSCOPY (Pts 45-52yrs Insurance coverage will need to be confirmed)  09/12/2015   FOOT EXAM  06/26/2017   COVID-19 Vaccine (4 - Booster for Moderna series) 11/21/2020   OPHTHALMOLOGY EXAM  01/09/2021   INFLUENZA VACCINE  04/30/2021   HEMOGLOBIN A1C  06/03/2021    Colorectal cancer screening: Type of screening: Colonoscopy. Completed "been several years". Repeat every 10 years  Lung Cancer Screening: (Low Dose CT Chest recommended if Age 31-80 years, 30 pack-year currently smoking OR have quit w/in 15years.) does not qualify.   Additional Screening:  Hepatitis C Screening: does qualify; Completed 03/24/17  Vision Screening: Recommended annual ophthalmology exams for early detection of glaucoma and other disorders of the eye. Is the patient up to date with their annual eye exam?  Yes  Who is the provider or what is the name of the office in which the patient attends annual eye exams? Dr.Mewbern If pt is not established with a  provider, would they like to be referred to a provider to establish care? No .   Dental Screening: Recommended annual dental exams for proper oral hygiene  Community Resource Referral / Chronic Care Management: CRR required this visit?  No   CCM required this visit?  No      Plan:     I have personally reviewed and noted the following in the patients chart:   Medical and social history Use of alcohol, tobacco or illicit drugs  Current medications and supplements including opioid prescriptions. Patient is not currently taking opioid prescriptions. Functional ability and status Nutritional status Physical activity Advanced directives List of other physicians Hospitalizations, surgeries, and ER visits in previous 12 months Vitals Screenings to include cognitive, depression, and falls Referrals and appointments  In addition, I have reviewed and discussed with patient certain preventive protocols, quality metrics, and best practice recommendations. A written personalized care plan for preventive services as well as general preventive health recommendations were provided to patient.     Peter David, LPN   [id number]   Nurse Notes: hemiplegia

## 2021-10-17 NOTE — Telephone Encounter (Signed)
Copied from [place] [id number]. Topic: Quick Communication - Home Health Verbal Orders >> Oct 17, 2021  2:58 PM Leward Quan A wrote: Caller/Agency: [place] // [facility] Number: [phone] ok to LM Requesting OT/PT/Skilled Nursing/Social Work/Speech Therapy: OT Frequency: 1 w 5

## 2021-10-17 NOTE — Patient Instructions (Signed)
Peter Ryan , Thank you for taking time to come for your Medicare Wellness Visit. I appreciate your ongoing commitment to your health goals. Please review the following plan we discussed and let me know if I can assist you in the future.   Screening recommendations/referrals: Colonoscopy: had years ago, declined referral at this time Recommended yearly ophthalmology/optometry visit for glaucoma screening and checkup Recommended yearly dental visit for hygiene and checkup  Vaccinations: Influenza vaccine: had this season at local pharmacy Pneumococcal vaccine: n/d Tdap vaccine: 11/21/14 Shingles vaccine: Shingrix 10/30/16, 02/21/17   Covid-19: 12/03/19, 12/31/19, 09/26/20  Advanced directives: no  Conditions/risks identified: hemiplegia  Next appointment: Follow up in one year for your annual wellness visit. 10/21/22 @ 2:20pm by phone  Preventive Care 65 Years and Older, Male Preventive care refers to lifestyle choices and visits with your health care provider that can promote health and wellness. What does preventive care include? A yearly physical exam. This is also called an annual well check. Dental exams once or twice a year. Routine eye exams. Ask your health care provider how often you should have your eyes checked. Personal lifestyle choices, including: Daily care of your teeth and gums. Regular physical activity. Eating a healthy diet. Avoiding tobacco and drug use. Limiting alcohol use. Practicing safe sex. Taking low doses of aspirin every day. Taking vitamin and mineral supplements as recommended by your health care provider. What happens during an annual well check? The services and screenings done by your health care provider during your annual well check will depend on your age, overall health, lifestyle risk factors, and family history of disease. Counseling  Your health care provider may ask you questions about your: Alcohol use. Tobacco use. Drug use. Emotional  well-being. Home and relationship well-being. Sexual activity. Eating habits. History of falls. Memory and ability to understand (cognition). Work and work Statistician. Screening  You may have the following tests or measurements: Height, weight, and BMI. Blood pressure. Lipid and cholesterol levels. These may be checked every 5 years, or more frequently if you are over 34 years old. Skin check. Lung cancer screening. You may have this screening every year starting at age 57 if you have a 30-pack-year history of smoking and currently smoke or have quit within the past 15 years. Fecal occult blood test (FOBT) of the stool. You may have this test every year starting at age 70. Flexible sigmoidoscopy or colonoscopy. You may have a sigmoidoscopy every 5 years or a colonoscopy every 10 years starting at age 51. Prostate cancer screening. Recommendations will vary depending on your family history and other risks. Hepatitis C blood test. Hepatitis B blood test. Sexually transmitted disease (STD) testing. Diabetes screening. This is done by checking your blood sugar (glucose) after you have not eaten for a while (fasting). You may have this done every 1-3 years. Abdominal aortic aneurysm (AAA) screening. You may need this if you are a current or former smoker. Osteoporosis. You may be screened starting at age 75 if you are at high risk. Talk with your health care provider about your test results, treatment options, and if necessary, the need for more tests. Vaccines  Your health care provider may recommend certain vaccines, such as: Influenza vaccine. This is recommended every year. Tetanus, diphtheria, and acellular pertussis (Tdap, Td) vaccine. You may need a Td booster every 10 years. Zoster vaccine. You may need this after age 3. Pneumococcal 13-valent conjugate (PCV13) vaccine. One dose is recommended after age 70. Pneumococcal polysaccharide (PPSV23)  vaccine. One dose is recommended after  age 32. Talk to your health care provider about which screenings and vaccines you need and how often you need them. This information is not intended to replace advice given to you by your health care provider. Make sure you discuss any questions you have with your health care provider. Document Released: 10/13/2015 Document Revised: 06/05/2016 Document Reviewed: 07/18/2015 Elsevier Interactive Patient Education  2017 [place] Prevention in the Home Falls can cause injuries. They can happen to people of all ages. There are many things you can do to make your home safe and to help prevent falls. What can I do on the outside of my home? Regularly fix the edges of walkways and driveways and fix any cracks. Remove anything that might make you trip as you walk through a door, such as a raised step or threshold. Trim any bushes or trees on the path to your home. Use bright outdoor lighting. Clear any walking paths of anything that might make someone trip, such as rocks or tools. Regularly check to see if handrails are loose or broken. Make sure that both sides of any steps have handrails. Any raised decks and porches should have guardrails on the edges. Have any leaves, snow, or ice cleared regularly. Use sand or salt on walking paths during winter. Clean up any spills in your garage right away. This includes oil or grease spills. What can I do in the bathroom? Use night lights. Install grab bars by the toilet and in the tub and shower. Do not use towel bars as grab bars. Use non-skid mats or decals in the tub or shower. If you need to sit down in the shower, use a plastic, non-slip stool. Keep the floor dry. Clean up any water that spills on the floor as soon as it happens. Remove soap buildup in the tub or shower regularly. Attach bath mats securely with double-sided non-slip rug tape. Do not have throw rugs and other things on the floor that can make you trip. What can I do in the  bedroom? Use night lights. Make sure that you have a light by your bed that is easy to reach. Do not use any sheets or blankets that are too big for your bed. They should not hang down onto the floor. Have a firm chair that has side arms. You can use this for support while you get dressed. Do not have throw rugs and other things on the floor that can make you trip. What can I do in the kitchen? Clean up any spills right away. Avoid walking on wet floors. Keep items that you use a lot in easy-to-reach places. If you need to reach something above you, use a strong step stool that has a grab bar. Keep electrical cords out of the way. Do not use floor polish or wax that makes floors slippery. If you must use wax, use non-skid floor wax. Do not have throw rugs and other things on the floor that can make you trip. What can I do with my stairs? Do not leave any items on the stairs. Make sure that there are handrails on both sides of the stairs and use them. Fix handrails that are broken or loose. Make sure that handrails are as long as the stairways. Check any carpeting to make sure that it is firmly attached to the stairs. Fix any carpet that is loose or worn. Avoid having throw rugs at the top or bottom  of the stairs. If you do have throw rugs, attach them to the floor with carpet tape. Make sure that you have a light switch at the top of the stairs and the bottom of the stairs. If you do not have them, ask someone to add them for you. What else can I do to help prevent falls? Wear shoes that: Do not have high heels. Have rubber bottoms. Are comfortable and fit you well. Are closed at the toe. Do not wear sandals. If you use a stepladder: Make sure that it is fully opened. Do not climb a closed stepladder. Make sure that both sides of the stepladder are locked into place. Ask someone to hold it for you, if possible. Clearly mark and make sure that you can see: Any grab bars or  handrails. First and last steps. Where the edge of each step is. Use tools that help you move around (mobility aids) if they are needed. These include: Canes. Walkers. Scooters. Crutches. Turn on the lights when you go into a dark area. Replace any light bulbs as soon as they burn out. Set up your furniture so you have a clear path. Avoid moving your furniture around. If any of your floors are uneven, fix them. If there are any pets around you, be aware of where they are. Review your medicines with your doctor. Some medicines can make you feel dizzy. This can increase your chance of falling. Ask your doctor what other things that you can do to help prevent falls. This information is not intended to replace advice given to you by your health care provider. Make sure you discuss any questions you have with your health care provider. Document Released: 07/13/2009 Document Revised: 02/22/2016 Document Reviewed: 10/21/2014 Elsevier Interactive Patient Education  2017 [employer].

## 2021-10-19 DIAGNOSIS — I69392 Facial weakness following cerebral infarction: Secondary | ICD-10-CM | POA: Diagnosis not present

## 2021-10-19 DIAGNOSIS — E1159 Type 2 diabetes mellitus with other circulatory complications: Secondary | ICD-10-CM | POA: Diagnosis not present

## 2021-10-19 DIAGNOSIS — I152 Hypertension secondary to endocrine disorders: Secondary | ICD-10-CM | POA: Diagnosis not present

## 2021-10-19 DIAGNOSIS — M109 Gout, unspecified: Secondary | ICD-10-CM | POA: Diagnosis not present

## 2021-10-19 DIAGNOSIS — E1142 Type 2 diabetes mellitus with diabetic polyneuropathy: Secondary | ICD-10-CM | POA: Diagnosis not present

## 2021-10-19 DIAGNOSIS — I69351 Hemiplegia and hemiparesis following cerebral infarction affecting right dominant side: Secondary | ICD-10-CM | POA: Diagnosis not present

## 2021-10-19 NOTE — Telephone Encounter (Signed)
Verbal ok was given. 

## 2021-10-21 DIAGNOSIS — Z87891 Personal history of nicotine dependence: Secondary | ICD-10-CM | POA: Diagnosis not present

## 2021-10-21 DIAGNOSIS — K219 Gastro-esophageal reflux disease without esophagitis: Secondary | ICD-10-CM | POA: Diagnosis not present

## 2021-10-21 DIAGNOSIS — Z9181 History of falling: Secondary | ICD-10-CM | POA: Diagnosis not present

## 2021-10-21 DIAGNOSIS — N401 Enlarged prostate with lower urinary tract symptoms: Secondary | ICD-10-CM | POA: Diagnosis not present

## 2021-10-21 DIAGNOSIS — E1169 Type 2 diabetes mellitus with other specified complication: Secondary | ICD-10-CM | POA: Diagnosis not present

## 2021-10-21 DIAGNOSIS — K7581 Nonalcoholic steatohepatitis (NASH): Secondary | ICD-10-CM | POA: Diagnosis not present

## 2021-10-21 DIAGNOSIS — K7460 Unspecified cirrhosis of liver: Secondary | ICD-10-CM | POA: Diagnosis not present

## 2021-10-21 DIAGNOSIS — E1151 Type 2 diabetes mellitus with diabetic peripheral angiopathy without gangrene: Secondary | ICD-10-CM | POA: Diagnosis not present

## 2021-10-21 DIAGNOSIS — L409 Psoriasis, unspecified: Secondary | ICD-10-CM | POA: Diagnosis not present

## 2021-10-21 DIAGNOSIS — E1159 Type 2 diabetes mellitus with other circulatory complications: Secondary | ICD-10-CM | POA: Diagnosis not present

## 2021-10-21 DIAGNOSIS — I69392 Facial weakness following cerebral infarction: Secondary | ICD-10-CM | POA: Diagnosis not present

## 2021-10-21 DIAGNOSIS — I700 Atherosclerosis of aorta: Secondary | ICD-10-CM | POA: Diagnosis not present

## 2021-10-21 DIAGNOSIS — E1142 Type 2 diabetes mellitus with diabetic polyneuropathy: Secondary | ICD-10-CM | POA: Diagnosis not present

## 2021-10-21 DIAGNOSIS — I69351 Hemiplegia and hemiparesis following cerebral infarction affecting right dominant side: Secondary | ICD-10-CM | POA: Diagnosis not present

## 2021-10-21 DIAGNOSIS — N138 Other obstructive and reflux uropathy: Secondary | ICD-10-CM | POA: Diagnosis not present

## 2021-10-21 DIAGNOSIS — Z8572 Personal history of non-Hodgkin lymphomas: Secondary | ICD-10-CM | POA: Diagnosis not present

## 2021-10-21 DIAGNOSIS — Z6841 Body Mass Index (BMI) 40.0 and over, adult: Secondary | ICD-10-CM | POA: Diagnosis not present

## 2021-10-21 DIAGNOSIS — I152 Hypertension secondary to endocrine disorders: Secondary | ICD-10-CM | POA: Diagnosis not present

## 2021-10-21 DIAGNOSIS — E785 Hyperlipidemia, unspecified: Secondary | ICD-10-CM | POA: Diagnosis not present

## 2021-10-21 DIAGNOSIS — Z7982 Long term (current) use of aspirin: Secondary | ICD-10-CM | POA: Diagnosis not present

## 2021-10-21 DIAGNOSIS — M109 Gout, unspecified: Secondary | ICD-10-CM | POA: Diagnosis not present

## 2021-10-21 DIAGNOSIS — D472 Monoclonal gammopathy: Secondary | ICD-10-CM | POA: Diagnosis not present

## 2021-10-21 DIAGNOSIS — M1990 Unspecified osteoarthritis, unspecified site: Secondary | ICD-10-CM | POA: Diagnosis not present

## 2021-10-22 DIAGNOSIS — I69392 Facial weakness following cerebral infarction: Secondary | ICD-10-CM | POA: Diagnosis not present

## 2021-10-22 DIAGNOSIS — I152 Hypertension secondary to endocrine disorders: Secondary | ICD-10-CM | POA: Diagnosis not present

## 2021-10-22 DIAGNOSIS — I69351 Hemiplegia and hemiparesis following cerebral infarction affecting right dominant side: Secondary | ICD-10-CM | POA: Diagnosis not present

## 2021-10-22 DIAGNOSIS — E1142 Type 2 diabetes mellitus with diabetic polyneuropathy: Secondary | ICD-10-CM | POA: Diagnosis not present

## 2021-10-22 DIAGNOSIS — E1159 Type 2 diabetes mellitus with other circulatory complications: Secondary | ICD-10-CM | POA: Diagnosis not present

## 2021-10-22 DIAGNOSIS — M109 Gout, unspecified: Secondary | ICD-10-CM | POA: Diagnosis not present

## 2021-10-23 ENCOUNTER — Other Ambulatory Visit: Payer: Self-pay

## 2021-10-23 ENCOUNTER — Other Ambulatory Visit: Payer: Medicare Other | Admitting: Student

## 2021-10-23 DIAGNOSIS — R1310 Dysphagia, unspecified: Secondary | ICD-10-CM

## 2021-10-23 DIAGNOSIS — Z515 Encounter for palliative care: Secondary | ICD-10-CM | POA: Diagnosis not present

## 2021-10-23 DIAGNOSIS — G8191 Hemiplegia, unspecified affecting right dominant side: Secondary | ICD-10-CM

## 2021-10-23 DIAGNOSIS — R238 Other skin changes: Secondary | ICD-10-CM

## 2021-10-23 NOTE — Progress Notes (Signed)
[facility] Palliative Care Consult Note Telephone: [phone]  Fax: [phone]    Date of encounter: 10/23/21 2:37 PM PATIENT NAME: Peter Ryan [address] [place] [address]   [phone] (home)  DOB: 12/18/1950 MRN: [id number] PRIMARY CARE PROVIDER:    Jerrol Ryan., MD,  [address] [place] [address] [phone]  REFERRING PROVIDER:   Jerrol Ryan., MD [address] [place],   [address] [phone]  RESPONSIBLE PARTY:    Contact Information     Name Relation Home Work Mobile   Peter Ryan [phone]          I met face to face with patient and family in patient's home. Palliative Care was asked to follow this patient by consultation request of  Peter Ryan.,* to address advance care planning and complex medical decision making. This is a follow up visit.                                   ASSESSMENT AND PLAN / RECOMMENDATIONS:   Advance Care Planning/Goals of Care: Goals include to maximize quality of life and symptom management. Patient/health care surrogate gave his/her permission to discuss. CODE STATUS: Full Code  Symptom Management/Plan:  CVA/Right-sided hemiparesis: Continue PT and work on exercises independently between sessions, gradually increase physical activity. Use stedy lift for transfers.   Dysphagia: Chew food approximately 15 times per bite, drink fluids more slowly, attempt to drink from cup rather than straw.  Skin irritation to Sacrum: Shift weight every 1-2 hours while in chair. Redistribute pressure by offloading with pillows. Gradually increase activity/getting out of chair. Start applying Butt Paste to prevent moisture-related skin breakdown BID; script sent to pharmacy.   Follow up Palliative Care Visit: Palliative care will continue to follow for complex medical decision making, advance care planning, and  clarification of goals. Return 8 weeks or prn.   This visit was coded based on medical decision making (MDM).  PPS: 40%  HOSPICE ELIGIBILITY/DIAGNOSIS: TBD  Chief Complaint: Palliative Care follow-up visit  HISTORY OF PRESENT ILLNESS:  Peter Ryan is a 71 y.o. 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 Seen today in-home with wife present. Peter Ryan reports some dysphagia with both solids and liquids, stating that he gulps and eats fast, then gets choked. He has a good appetite but reports taste alteration, stating, "nothing tastes right." He denies pain, but has been having some "soreness" in his bottom from prolonged sitting. He has been working with [facility] and will be reevaluated tomorrow. He is currently non-ambulatory and has been working on Editor, commissioning and transferring with a steady lift. He reports getting up to his wheelchair approx. once daily. His goal is to be able to walk again. He currently spends most of his time in his recliner, including sleeping. He is sleeping well. He denies SOB, and reports having regular BM's (using Miralax PRN).   History obtained from review of EMR, discussion with primary team, and interview with family, facility staff/caregiver and/or Peter Ryan.  I reviewed available labs, medications, imaging, studies and related documents from the EMR.  Records reviewed and summarized above.   ROS  General: NAD EYES: denies vision changes ENMT: see HPI Cardiovascular: denies chest pain,  denies DOE Pulmonary: denies cough, denies increased SOB Abdomen: endorses good appetite, denies constipation, endorses continence of bowel GU: denies dysuria, endorses continence of urine MSK:  denies increased weakness,  no falls reported Skin: denies rashes or wounds Neurological: denies pain, denies  insomnia Psych: Endorses positive mood Heme/lymph/immuno: denies bruises, abnormal bleeding  Physical Exam: Current and past weights: BP 158/68, HR 87, SpO2 98% on RA  Constitutional: NAD General: frail appearing, WNWD  EYES: anicteric sclera, lids intact, no discharge  ENMT: intact hearing, oral mucous membranes moist, dentition intact CV: S1S2, RRR, no LE edema Pulmonary: LCTA, no increased work of breathing, no cough, room air Abdomen: intake 100%, normo-active BS + 4 quadrants, soft and non tender, no ascites GU: deferred MSK: no sarcopenia, right hemiparesis, non-ambulatory Skin: warm and dry, no rashes or wounds on visible skin Neuro:  no generalized weakness,  no cognitive impairment Psych: non-anxious affect, A and O x 3 Hem/lymph/immuno: no widespread bruising   Thank you for the opportunity to participate in the care of Peter Ryan.  The palliative care team will continue to follow. Please call our office at [phone] if we can be of additional assistance.   Peter Dakins, RN, [place], NP   COVID-19 PATIENT SCREENING TOOL Asked and negative response unless otherwise noted:   Have you had symptoms of covid, tested positive or been in contact with someone with symptoms/positive test in the past 5-10 days? No

## 2021-10-24 DIAGNOSIS — E1142 Type 2 diabetes mellitus with diabetic polyneuropathy: Secondary | ICD-10-CM | POA: Diagnosis not present

## 2021-10-24 DIAGNOSIS — I69351 Hemiplegia and hemiparesis following cerebral infarction affecting right dominant side: Secondary | ICD-10-CM | POA: Diagnosis not present

## 2021-10-24 DIAGNOSIS — M109 Gout, unspecified: Secondary | ICD-10-CM | POA: Diagnosis not present

## 2021-10-24 DIAGNOSIS — E1159 Type 2 diabetes mellitus with other circulatory complications: Secondary | ICD-10-CM | POA: Diagnosis not present

## 2021-10-24 DIAGNOSIS — I152 Hypertension secondary to endocrine disorders: Secondary | ICD-10-CM | POA: Diagnosis not present

## 2021-10-24 DIAGNOSIS — I69392 Facial weakness following cerebral infarction: Secondary | ICD-10-CM | POA: Diagnosis not present

## 2021-10-26 DIAGNOSIS — E1142 Type 2 diabetes mellitus with diabetic polyneuropathy: Secondary | ICD-10-CM | POA: Diagnosis not present

## 2021-10-26 DIAGNOSIS — I152 Hypertension secondary to endocrine disorders: Secondary | ICD-10-CM | POA: Diagnosis not present

## 2021-10-26 DIAGNOSIS — I69351 Hemiplegia and hemiparesis following cerebral infarction affecting right dominant side: Secondary | ICD-10-CM | POA: Diagnosis not present

## 2021-10-26 DIAGNOSIS — E1159 Type 2 diabetes mellitus with other circulatory complications: Secondary | ICD-10-CM | POA: Diagnosis not present

## 2021-10-26 DIAGNOSIS — I69392 Facial weakness following cerebral infarction: Secondary | ICD-10-CM | POA: Diagnosis not present

## 2021-10-26 DIAGNOSIS — M109 Gout, unspecified: Secondary | ICD-10-CM | POA: Diagnosis not present

## 2021-10-31 DIAGNOSIS — M109 Gout, unspecified: Secondary | ICD-10-CM | POA: Diagnosis not present

## 2021-10-31 DIAGNOSIS — I152 Hypertension secondary to endocrine disorders: Secondary | ICD-10-CM | POA: Diagnosis not present

## 2021-10-31 DIAGNOSIS — E1142 Type 2 diabetes mellitus with diabetic polyneuropathy: Secondary | ICD-10-CM | POA: Diagnosis not present

## 2021-10-31 DIAGNOSIS — E1159 Type 2 diabetes mellitus with other circulatory complications: Secondary | ICD-10-CM | POA: Diagnosis not present

## 2021-10-31 DIAGNOSIS — I69392 Facial weakness following cerebral infarction: Secondary | ICD-10-CM | POA: Diagnosis not present

## 2021-10-31 DIAGNOSIS — I69351 Hemiplegia and hemiparesis following cerebral infarction affecting right dominant side: Secondary | ICD-10-CM | POA: Diagnosis not present

## 2021-11-02 DIAGNOSIS — E1159 Type 2 diabetes mellitus with other circulatory complications: Secondary | ICD-10-CM | POA: Diagnosis not present

## 2021-11-02 DIAGNOSIS — I69351 Hemiplegia and hemiparesis following cerebral infarction affecting right dominant side: Secondary | ICD-10-CM | POA: Diagnosis not present

## 2021-11-02 DIAGNOSIS — I69392 Facial weakness following cerebral infarction: Secondary | ICD-10-CM | POA: Diagnosis not present

## 2021-11-02 DIAGNOSIS — E1142 Type 2 diabetes mellitus with diabetic polyneuropathy: Secondary | ICD-10-CM | POA: Diagnosis not present

## 2021-11-02 DIAGNOSIS — M109 Gout, unspecified: Secondary | ICD-10-CM | POA: Diagnosis not present

## 2021-11-02 DIAGNOSIS — I152 Hypertension secondary to endocrine disorders: Secondary | ICD-10-CM | POA: Diagnosis not present

## 2021-11-05 DIAGNOSIS — I69351 Hemiplegia and hemiparesis following cerebral infarction affecting right dominant side: Secondary | ICD-10-CM | POA: Diagnosis not present

## 2021-11-05 DIAGNOSIS — E1142 Type 2 diabetes mellitus with diabetic polyneuropathy: Secondary | ICD-10-CM | POA: Diagnosis not present

## 2021-11-05 DIAGNOSIS — M109 Gout, unspecified: Secondary | ICD-10-CM | POA: Diagnosis not present

## 2021-11-05 DIAGNOSIS — I69392 Facial weakness following cerebral infarction: Secondary | ICD-10-CM | POA: Diagnosis not present

## 2021-11-05 DIAGNOSIS — E1159 Type 2 diabetes mellitus with other circulatory complications: Secondary | ICD-10-CM | POA: Diagnosis not present

## 2021-11-05 DIAGNOSIS — I152 Hypertension secondary to endocrine disorders: Secondary | ICD-10-CM | POA: Diagnosis not present

## 2021-11-06 DIAGNOSIS — E1159 Type 2 diabetes mellitus with other circulatory complications: Secondary | ICD-10-CM | POA: Diagnosis not present

## 2021-11-06 DIAGNOSIS — I69392 Facial weakness following cerebral infarction: Secondary | ICD-10-CM | POA: Diagnosis not present

## 2021-11-06 DIAGNOSIS — I69351 Hemiplegia and hemiparesis following cerebral infarction affecting right dominant side: Secondary | ICD-10-CM | POA: Diagnosis not present

## 2021-11-06 DIAGNOSIS — E1142 Type 2 diabetes mellitus with diabetic polyneuropathy: Secondary | ICD-10-CM | POA: Diagnosis not present

## 2021-11-06 DIAGNOSIS — I152 Hypertension secondary to endocrine disorders: Secondary | ICD-10-CM | POA: Diagnosis not present

## 2021-11-06 DIAGNOSIS — M109 Gout, unspecified: Secondary | ICD-10-CM | POA: Diagnosis not present

## 2021-11-09 DIAGNOSIS — I69351 Hemiplegia and hemiparesis following cerebral infarction affecting right dominant side: Secondary | ICD-10-CM | POA: Diagnosis not present

## 2021-11-09 DIAGNOSIS — I152 Hypertension secondary to endocrine disorders: Secondary | ICD-10-CM | POA: Diagnosis not present

## 2021-11-09 DIAGNOSIS — E1142 Type 2 diabetes mellitus with diabetic polyneuropathy: Secondary | ICD-10-CM | POA: Diagnosis not present

## 2021-11-09 DIAGNOSIS — I69392 Facial weakness following cerebral infarction: Secondary | ICD-10-CM | POA: Diagnosis not present

## 2021-11-09 DIAGNOSIS — E1159 Type 2 diabetes mellitus with other circulatory complications: Secondary | ICD-10-CM | POA: Diagnosis not present

## 2021-11-09 DIAGNOSIS — M109 Gout, unspecified: Secondary | ICD-10-CM | POA: Diagnosis not present

## 2021-11-14 DIAGNOSIS — E1142 Type 2 diabetes mellitus with diabetic polyneuropathy: Secondary | ICD-10-CM | POA: Diagnosis not present

## 2021-11-14 DIAGNOSIS — M109 Gout, unspecified: Secondary | ICD-10-CM | POA: Diagnosis not present

## 2021-11-14 DIAGNOSIS — I69351 Hemiplegia and hemiparesis following cerebral infarction affecting right dominant side: Secondary | ICD-10-CM | POA: Diagnosis not present

## 2021-11-14 DIAGNOSIS — E1159 Type 2 diabetes mellitus with other circulatory complications: Secondary | ICD-10-CM | POA: Diagnosis not present

## 2021-11-14 DIAGNOSIS — I152 Hypertension secondary to endocrine disorders: Secondary | ICD-10-CM | POA: Diagnosis not present

## 2021-11-14 DIAGNOSIS — I69392 Facial weakness following cerebral infarction: Secondary | ICD-10-CM | POA: Diagnosis not present

## 2021-11-15 ENCOUNTER — Telehealth: Payer: Self-pay

## 2021-11-15 ENCOUNTER — Telehealth: Payer: Self-pay | Admitting: Family Medicine

## 2021-11-15 DIAGNOSIS — I69392 Facial weakness following cerebral infarction: Secondary | ICD-10-CM | POA: Diagnosis not present

## 2021-11-15 DIAGNOSIS — I69351 Hemiplegia and hemiparesis following cerebral infarction affecting right dominant side: Secondary | ICD-10-CM | POA: Diagnosis not present

## 2021-11-15 DIAGNOSIS — I152 Hypertension secondary to endocrine disorders: Secondary | ICD-10-CM | POA: Diagnosis not present

## 2021-11-15 DIAGNOSIS — E1159 Type 2 diabetes mellitus with other circulatory complications: Secondary | ICD-10-CM | POA: Diagnosis not present

## 2021-11-15 DIAGNOSIS — E1142 Type 2 diabetes mellitus with diabetic polyneuropathy: Secondary | ICD-10-CM | POA: Diagnosis not present

## 2021-11-15 DIAGNOSIS — M109 Gout, unspecified: Secondary | ICD-10-CM | POA: Diagnosis not present

## 2021-11-15 NOTE — Telephone Encounter (Signed)
Copied from [place] [phone]. Topic: Quick Communication - Home Health Verbal Orders >> Nov 15, 2021 10:42 AM Loma Boston wrote: Caller/Agency: [facility] Number: [phone] Requesting OT Therapy: 1 time a wk for (4) wks  Frequency: 1 time per wk  for 4 wk

## 2021-11-15 NOTE — Telephone Encounter (Signed)
Home Health Verbal Orders - Caller/Agency: Soni/ [facility]  Callback Number: [phone] vm can be left  Requesting PT Frequency: 1x a week for 5 weeks

## 2021-11-20 ENCOUNTER — Telehealth: Payer: Self-pay

## 2021-11-20 DIAGNOSIS — M109 Gout, unspecified: Secondary | ICD-10-CM | POA: Diagnosis not present

## 2021-11-20 DIAGNOSIS — Z6841 Body Mass Index (BMI) 40.0 and over, adult: Secondary | ICD-10-CM | POA: Diagnosis not present

## 2021-11-20 DIAGNOSIS — K7460 Unspecified cirrhosis of liver: Secondary | ICD-10-CM | POA: Diagnosis not present

## 2021-11-20 DIAGNOSIS — I69351 Hemiplegia and hemiparesis following cerebral infarction affecting right dominant side: Secondary | ICD-10-CM | POA: Diagnosis not present

## 2021-11-20 DIAGNOSIS — E785 Hyperlipidemia, unspecified: Secondary | ICD-10-CM | POA: Diagnosis not present

## 2021-11-20 DIAGNOSIS — Z87891 Personal history of nicotine dependence: Secondary | ICD-10-CM | POA: Diagnosis not present

## 2021-11-20 DIAGNOSIS — E1151 Type 2 diabetes mellitus with diabetic peripheral angiopathy without gangrene: Secondary | ICD-10-CM | POA: Diagnosis not present

## 2021-11-20 DIAGNOSIS — N401 Enlarged prostate with lower urinary tract symptoms: Secondary | ICD-10-CM | POA: Diagnosis not present

## 2021-11-20 DIAGNOSIS — I700 Atherosclerosis of aorta: Secondary | ICD-10-CM | POA: Diagnosis not present

## 2021-11-20 DIAGNOSIS — D472 Monoclonal gammopathy: Secondary | ICD-10-CM | POA: Diagnosis not present

## 2021-11-20 DIAGNOSIS — Z8572 Personal history of non-Hodgkin lymphomas: Secondary | ICD-10-CM | POA: Diagnosis not present

## 2021-11-20 DIAGNOSIS — N138 Other obstructive and reflux uropathy: Secondary | ICD-10-CM | POA: Diagnosis not present

## 2021-11-20 DIAGNOSIS — E1169 Type 2 diabetes mellitus with other specified complication: Secondary | ICD-10-CM | POA: Diagnosis not present

## 2021-11-20 DIAGNOSIS — L409 Psoriasis, unspecified: Secondary | ICD-10-CM | POA: Diagnosis not present

## 2021-11-20 DIAGNOSIS — Z7982 Long term (current) use of aspirin: Secondary | ICD-10-CM | POA: Diagnosis not present

## 2021-11-20 DIAGNOSIS — E1159 Type 2 diabetes mellitus with other circulatory complications: Secondary | ICD-10-CM | POA: Diagnosis not present

## 2021-11-20 DIAGNOSIS — K7581 Nonalcoholic steatohepatitis (NASH): Secondary | ICD-10-CM | POA: Diagnosis not present

## 2021-11-20 DIAGNOSIS — I152 Hypertension secondary to endocrine disorders: Secondary | ICD-10-CM | POA: Diagnosis not present

## 2021-11-20 DIAGNOSIS — M1990 Unspecified osteoarthritis, unspecified site: Secondary | ICD-10-CM | POA: Diagnosis not present

## 2021-11-20 DIAGNOSIS — Z9181 History of falling: Secondary | ICD-10-CM | POA: Diagnosis not present

## 2021-11-20 DIAGNOSIS — K219 Gastro-esophageal reflux disease without esophagitis: Secondary | ICD-10-CM | POA: Diagnosis not present

## 2021-11-20 DIAGNOSIS — E1142 Type 2 diabetes mellitus with diabetic polyneuropathy: Secondary | ICD-10-CM | POA: Diagnosis not present

## 2021-11-20 DIAGNOSIS — I69392 Facial weakness following cerebral infarction: Secondary | ICD-10-CM | POA: Diagnosis not present

## 2021-11-20 NOTE — Telephone Encounter (Signed)
Copied from [place] [phone]. Topic: General - Other >> Nov 20, 2021 12:02 PM Valere Dross wrote: Reason for CRM: Deanna from [facility] called in requesting to get those 3 signed recent orders faxed over at [phone]

## 2021-11-21 DIAGNOSIS — I69392 Facial weakness following cerebral infarction: Secondary | ICD-10-CM | POA: Diagnosis not present

## 2021-11-21 DIAGNOSIS — I69351 Hemiplegia and hemiparesis following cerebral infarction affecting right dominant side: Secondary | ICD-10-CM | POA: Diagnosis not present

## 2021-11-21 DIAGNOSIS — E1159 Type 2 diabetes mellitus with other circulatory complications: Secondary | ICD-10-CM | POA: Diagnosis not present

## 2021-11-21 DIAGNOSIS — I152 Hypertension secondary to endocrine disorders: Secondary | ICD-10-CM | POA: Diagnosis not present

## 2021-11-21 DIAGNOSIS — M109 Gout, unspecified: Secondary | ICD-10-CM | POA: Diagnosis not present

## 2021-11-21 DIAGNOSIS — E1142 Type 2 diabetes mellitus with diabetic polyneuropathy: Secondary | ICD-10-CM | POA: Diagnosis not present

## 2021-11-26 ENCOUNTER — Other Ambulatory Visit: Payer: Self-pay | Admitting: Neurology

## 2021-11-26 DIAGNOSIS — I6930 Unspecified sequelae of cerebral infarction: Secondary | ICD-10-CM

## 2021-11-26 DIAGNOSIS — I639 Cerebral infarction, unspecified: Secondary | ICD-10-CM

## 2021-11-29 ENCOUNTER — Telehealth: Payer: Self-pay | Admitting: Family Medicine

## 2021-11-29 NOTE — Telephone Encounter (Signed)
Soni from [facility] called and stated that the pt would like to place all services on hold this week due to covid exposure  ?

## 2021-12-03 DIAGNOSIS — I69392 Facial weakness following cerebral infarction: Secondary | ICD-10-CM | POA: Diagnosis not present

## 2021-12-03 DIAGNOSIS — M109 Gout, unspecified: Secondary | ICD-10-CM | POA: Diagnosis not present

## 2021-12-03 DIAGNOSIS — I69351 Hemiplegia and hemiparesis following cerebral infarction affecting right dominant side: Secondary | ICD-10-CM | POA: Diagnosis not present

## 2021-12-03 DIAGNOSIS — E1159 Type 2 diabetes mellitus with other circulatory complications: Secondary | ICD-10-CM | POA: Diagnosis not present

## 2021-12-03 DIAGNOSIS — I152 Hypertension secondary to endocrine disorders: Secondary | ICD-10-CM | POA: Diagnosis not present

## 2021-12-03 DIAGNOSIS — E1142 Type 2 diabetes mellitus with diabetic polyneuropathy: Secondary | ICD-10-CM | POA: Diagnosis not present

## 2021-12-04 DIAGNOSIS — E1159 Type 2 diabetes mellitus with other circulatory complications: Secondary | ICD-10-CM | POA: Diagnosis not present

## 2021-12-04 DIAGNOSIS — I69351 Hemiplegia and hemiparesis following cerebral infarction affecting right dominant side: Secondary | ICD-10-CM | POA: Diagnosis not present

## 2021-12-04 DIAGNOSIS — E1142 Type 2 diabetes mellitus with diabetic polyneuropathy: Secondary | ICD-10-CM | POA: Diagnosis not present

## 2021-12-04 DIAGNOSIS — I69392 Facial weakness following cerebral infarction: Secondary | ICD-10-CM | POA: Diagnosis not present

## 2021-12-04 DIAGNOSIS — I152 Hypertension secondary to endocrine disorders: Secondary | ICD-10-CM | POA: Diagnosis not present

## 2021-12-04 DIAGNOSIS — M109 Gout, unspecified: Secondary | ICD-10-CM | POA: Diagnosis not present

## 2021-12-06 DIAGNOSIS — E1142 Type 2 diabetes mellitus with diabetic polyneuropathy: Secondary | ICD-10-CM | POA: Diagnosis not present

## 2021-12-06 DIAGNOSIS — I69392 Facial weakness following cerebral infarction: Secondary | ICD-10-CM | POA: Diagnosis not present

## 2021-12-06 DIAGNOSIS — E1159 Type 2 diabetes mellitus with other circulatory complications: Secondary | ICD-10-CM | POA: Diagnosis not present

## 2021-12-06 DIAGNOSIS — I69351 Hemiplegia and hemiparesis following cerebral infarction affecting right dominant side: Secondary | ICD-10-CM | POA: Diagnosis not present

## 2021-12-06 DIAGNOSIS — I152 Hypertension secondary to endocrine disorders: Secondary | ICD-10-CM | POA: Diagnosis not present

## 2021-12-06 DIAGNOSIS — M109 Gout, unspecified: Secondary | ICD-10-CM | POA: Diagnosis not present

## 2021-12-07 DIAGNOSIS — I69392 Facial weakness following cerebral infarction: Secondary | ICD-10-CM | POA: Diagnosis not present

## 2021-12-07 DIAGNOSIS — E1159 Type 2 diabetes mellitus with other circulatory complications: Secondary | ICD-10-CM | POA: Diagnosis not present

## 2021-12-07 DIAGNOSIS — M109 Gout, unspecified: Secondary | ICD-10-CM | POA: Diagnosis not present

## 2021-12-07 DIAGNOSIS — E1142 Type 2 diabetes mellitus with diabetic polyneuropathy: Secondary | ICD-10-CM | POA: Diagnosis not present

## 2021-12-07 DIAGNOSIS — I152 Hypertension secondary to endocrine disorders: Secondary | ICD-10-CM | POA: Diagnosis not present

## 2021-12-07 DIAGNOSIS — I69351 Hemiplegia and hemiparesis following cerebral infarction affecting right dominant side: Secondary | ICD-10-CM | POA: Diagnosis not present

## 2021-12-10 DIAGNOSIS — E1142 Type 2 diabetes mellitus with diabetic polyneuropathy: Secondary | ICD-10-CM | POA: Diagnosis not present

## 2021-12-10 DIAGNOSIS — M109 Gout, unspecified: Secondary | ICD-10-CM | POA: Diagnosis not present

## 2021-12-10 DIAGNOSIS — I152 Hypertension secondary to endocrine disorders: Secondary | ICD-10-CM | POA: Diagnosis not present

## 2021-12-10 DIAGNOSIS — E1159 Type 2 diabetes mellitus with other circulatory complications: Secondary | ICD-10-CM | POA: Diagnosis not present

## 2021-12-10 DIAGNOSIS — I69392 Facial weakness following cerebral infarction: Secondary | ICD-10-CM | POA: Diagnosis not present

## 2021-12-10 DIAGNOSIS — I69351 Hemiplegia and hemiparesis following cerebral infarction affecting right dominant side: Secondary | ICD-10-CM | POA: Diagnosis not present

## 2021-12-10 NOTE — Telephone Encounter (Signed)
Verbal given 

## 2021-12-13 DIAGNOSIS — I69392 Facial weakness following cerebral infarction: Secondary | ICD-10-CM | POA: Diagnosis not present

## 2021-12-13 DIAGNOSIS — E1142 Type 2 diabetes mellitus with diabetic polyneuropathy: Secondary | ICD-10-CM | POA: Diagnosis not present

## 2021-12-13 DIAGNOSIS — I152 Hypertension secondary to endocrine disorders: Secondary | ICD-10-CM | POA: Diagnosis not present

## 2021-12-13 DIAGNOSIS — E1159 Type 2 diabetes mellitus with other circulatory complications: Secondary | ICD-10-CM | POA: Diagnosis not present

## 2021-12-13 DIAGNOSIS — M109 Gout, unspecified: Secondary | ICD-10-CM | POA: Diagnosis not present

## 2021-12-13 DIAGNOSIS — I69351 Hemiplegia and hemiparesis following cerebral infarction affecting right dominant side: Secondary | ICD-10-CM | POA: Diagnosis not present

## 2021-12-14 DIAGNOSIS — I152 Hypertension secondary to endocrine disorders: Secondary | ICD-10-CM | POA: Diagnosis not present

## 2021-12-14 DIAGNOSIS — E1159 Type 2 diabetes mellitus with other circulatory complications: Secondary | ICD-10-CM | POA: Diagnosis not present

## 2021-12-14 DIAGNOSIS — I69392 Facial weakness following cerebral infarction: Secondary | ICD-10-CM | POA: Diagnosis not present

## 2021-12-14 DIAGNOSIS — E1142 Type 2 diabetes mellitus with diabetic polyneuropathy: Secondary | ICD-10-CM | POA: Diagnosis not present

## 2021-12-14 DIAGNOSIS — I69351 Hemiplegia and hemiparesis following cerebral infarction affecting right dominant side: Secondary | ICD-10-CM | POA: Diagnosis not present

## 2021-12-14 DIAGNOSIS — M109 Gout, unspecified: Secondary | ICD-10-CM | POA: Diagnosis not present

## 2021-12-18 DIAGNOSIS — I152 Hypertension secondary to endocrine disorders: Secondary | ICD-10-CM | POA: Diagnosis not present

## 2021-12-18 DIAGNOSIS — M109 Gout, unspecified: Secondary | ICD-10-CM | POA: Diagnosis not present

## 2021-12-18 DIAGNOSIS — I69351 Hemiplegia and hemiparesis following cerebral infarction affecting right dominant side: Secondary | ICD-10-CM | POA: Diagnosis not present

## 2021-12-18 DIAGNOSIS — I69392 Facial weakness following cerebral infarction: Secondary | ICD-10-CM | POA: Diagnosis not present

## 2021-12-18 DIAGNOSIS — E1142 Type 2 diabetes mellitus with diabetic polyneuropathy: Secondary | ICD-10-CM | POA: Diagnosis not present

## 2021-12-18 DIAGNOSIS — E1159 Type 2 diabetes mellitus with other circulatory complications: Secondary | ICD-10-CM | POA: Diagnosis not present

## 2021-12-24 ENCOUNTER — Other Ambulatory Visit: Payer: Self-pay

## 2021-12-24 ENCOUNTER — Other Ambulatory Visit: Payer: Medicare Other | Admitting: Student

## 2021-12-24 DIAGNOSIS — R238 Other skin changes: Secondary | ICD-10-CM

## 2021-12-24 DIAGNOSIS — R110 Nausea: Secondary | ICD-10-CM | POA: Diagnosis not present

## 2021-12-24 DIAGNOSIS — Z515 Encounter for palliative care: Secondary | ICD-10-CM

## 2021-12-24 DIAGNOSIS — G8191 Hemiplegia, unspecified affecting right dominant side: Secondary | ICD-10-CM

## 2021-12-24 DIAGNOSIS — R1310 Dysphagia, unspecified: Secondary | ICD-10-CM

## 2021-12-24 NOTE — Progress Notes (Signed)
? ? ?Manufacturing [facility] ?Community Palliative Care Consult Note ?Telephone: [phone]  ?Fax: [phone]  ? ? ?Date of encounter: 12/24/21 ?2:09 PM ?PATIENT NAME: Peter Ryan ?Peter Ryan   ?[phone] (home)  ?DOB: 04/15/1951 ?MRN: [id number] ?PRIMARY CARE PROVIDER:    ?Peter Ryan., MD,  ?[address] ?[place] [address] ?[phone] ? ?REFERRING PROVIDER:   ?Peter Ryan., MD ?[address] ?[place],  [place] [address] ?[phone] ? ?RESPONSIBLE PARTY:    ?Contact Information   ? ? Name Relation Home Work Mobile  ? Peter Ryan Spouse [phone]    ? ?  ? ? ? ?I met face to face with patient and family in the home. Palliative Care was asked to follow this patient by consultation request of  Peter Ryan.,* to address advance care planning and complex medical decision making. This is a follow up visit. ? ?                                 ASSESSMENT AND PLAN / RECOMMENDATIONS:  ? ?Advance Care Planning/Goals of Care: Goals include to maximize quality of life and symptom management. Patient/health care surrogate gave his/her permission to discuss. ?Our advance care planning conversation included a discussion about:    ?The value and importance of advance care planning  ?Experiences with loved ones who have been seriously ill or have died  ?Exploration of personal, cultural or spiritual beliefs that might influence medical decisions  ?Exploration of goals of care in the event of a sudden injury or illness  ?Identification of a healthcare agent; wife would like assistance with obtaining HCPOA. ?Reviewed MOST form; patient and wife want to discuss further. ? ?CODE STATUS: Full Code ? ?Symptom Management/Plan: ? ?CVA/right sided hemiparesis-completed therapy last week. Continue exercises to help maintain strength, functioning.  ? ?Dysphagia-occasional episodes; education on aspiration precautions.  ? ?Skin  irritation-has improved; continue barrier cream to buttocks. Encourage offloading, turning and repositioning every 2 hours.  ? ?Nausea-has occasional episodes, primarily occur when he is transferring; he does report some anxiety. Continue Motion sickness patch, change every 2-3 days. Recommend meclizine if continues; patient would like to hold off for now. We also discussed deep breathing techniques to help with anxiety.  ? ?Follow up Palliative Care Visit: Palliative care will continue to follow for complex medical decision making, advance care planning, and clarification of goals. Return in 8 weeks or prn. ? ? ?This visit was coded based on medical decision making (MDM). ? ?PPS: 40% ? ?HOSPICE ELIGIBILITY/DIAGNOSIS: TBD ? ?Chief Complaint: Palliative Medicine follow up visit.  ? ?HISTORY OF PRESENT ILLNESS:  Peter Ryan is a 71 y.o. 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  ?  ?Patient states he completed therapy last week. He reports some improvement, but expresses some sadness of not returning to point of ambulating again. Denies pain; shortness of breath. Skin breakdown to bottom has improved. Endorses good appetite; some coughing and swallowing difficulty. He is having 2-3 episodes a week of nausea when he transfers; he does report being anxiety. A 10-point ROS is negative except for the pertinent positives and negatives detailed per the HPI. ? ?History obtained from review of EMR, discussion with primary team, and interview with family, facility staff/caregiver and/or  Peter Ryan.  ?I reviewed available labs, medications, imaging, studies and related documents from the EMR.  Records reviewed and summarized above.  ? ? ?Physical Exam: ?Pulse 68, resp 16, 142/74, sats 95 % on room air ?Current and past weights: ?Constitutional: NAD ?General: frail  appearing ?EYES: anicteric sclera, lids intact, no discharge  ?ENMT: intact hearing, oral mucous membranes moist, dentition intact ?CV: S1S2, RRR, no LE edema ?Pulmonary: LCTA, no increased work of breathing, no cough, room air ?Abdomen: normo-active BS + 4 quadrants, soft and non tender, no ascites ?GU: deferred ?MSK: right sided hemiparesis, non ambulatory ?Skin: warm and dry, no rashes or wounds on visible skin ?Neuro:  no generalized weakness,  no cognitive impairment ?Psych: non-anxious affect, A and O x 3 ?Hem/lymph/immuno: no widespread bruising ? ? ?Thank you for the opportunity to participate in the care of Peter Ryan.  The palliative care team will continue to follow. Please call our office at [phone] if we can be of additional assistance.  ? ?Peter Slocumb, NP  ? ?COVID-19 PATIENT SCREENING TOOL ?Asked and negative response unless otherwise noted:  ? ?Have you had symptoms of covid, tested positive or been in contact with someone with symptoms/positive test in the past 5-10 days? No ? ?

## 2021-12-26 DIAGNOSIS — Z20822 Contact with and (suspected) exposure to covid-19: Secondary | ICD-10-CM | POA: Diagnosis not present

## 2021-12-26 NOTE — Progress Notes (Signed)
PC SW mailed ACP documents - HCPOA - to patient and spouse, per [facility] NP- L. Rivers request.  ? ?SW mailed the form s to address on file along with SW contact information for any furthers questions/needs. ?

## 2021-12-31 ENCOUNTER — Telehealth: Payer: Self-pay | Admitting: Family Medicine

## 2021-12-31 NOTE — Telephone Encounter (Signed)
I spoke with Mrs Mihalik and she has described needing help with things such as getting Peter Ryan up and down from his chair, in and out of the shower, etc.  I have explained that these are not considered skilled nursing needs but more the help of an aid.  She was asking about getting someone that Medicare would cover but I explained that they only cover when skilled care is required.  Have given her some information on home health aids to  help her find some help ?

## 2021-12-31 NOTE — Telephone Encounter (Signed)
Home Health Verbal Orders - Caller/Agency:Sherry Siever/spouse ?Callback Number:[phone]  ?Requesting Skilled Nursing ?Frequency: ? Pt cant walk well since strokes ?

## 2022-01-24 ENCOUNTER — Other Ambulatory Visit: Payer: Medicare Other | Admitting: Student

## 2022-01-28 ENCOUNTER — Other Ambulatory Visit: Payer: Self-pay | Admitting: Urology

## 2022-01-28 DIAGNOSIS — Z20822 Contact with and (suspected) exposure to covid-19: Secondary | ICD-10-CM | POA: Diagnosis not present

## 2022-02-04 ENCOUNTER — Telehealth: Payer: Self-pay

## 2022-02-04 NOTE — Telephone Encounter (Signed)
348 pm.  Request received from [facility], NP to schedule a home visit with patient.  Phone call made to wife [name].  No answer.  Message left requesting a call back to schedule a home visit.  ?

## 2022-02-05 ENCOUNTER — Other Ambulatory Visit: Payer: Medicare Other

## 2022-02-05 VITALS — BP 140/76 | HR 89 | Temp 97.3°F

## 2022-02-05 DIAGNOSIS — I10 Essential (primary) hypertension: Secondary | ICD-10-CM | POA: Diagnosis not present

## 2022-02-05 DIAGNOSIS — Z515 Encounter for palliative care: Secondary | ICD-10-CM

## 2022-02-05 DIAGNOSIS — G8191 Hemiplegia, unspecified affecting right dominant side: Secondary | ICD-10-CM | POA: Diagnosis not present

## 2022-02-05 NOTE — Progress Notes (Signed)
PATIENT NAME: Peter Ryan ?DOB: 1950/11/18 ?MRN: [id number] ? ?PRIMARY CARE PROVIDER: [name]., MD ? ?RESPONSIBLE PARTY:  ?Acct ID - Guarantor Home Phone Work Phone Relationship Acct Type  ?[id number] - Aument,JEF* [phone]  Self P/F  ?   [address] [place], [place] [address]  ? ? ?PLAN OF CARE and INTERVENTIONS: ?              1.  GOALS OF CARE/ ADVANCE CARE PLANNING:  Uncertain at this time. Wife may need more resources to care for patient in the home.  ?              2.  PATIENT/CAREGIVER EDUCATION:  Constipation and Mucinex.  ?              4. PERSONAL EMERGENCY PLAN:  Activate 911 for emergencies.  ?              5.  DISEASE STATUS: ? ?Blood Work:  New orders received from [employer], NP to obtain a CBC and CMP as blood work has not been completed recently.  Blood work obtained in the left hand on 2nd attempt.  Blood taken to [facility] on [address] for processing.  ? ?Constipation:  Patient has not had a bowel movement x 1 week.  Miralax is being administered daily and colace as needed.  Bowel sounds present x 4 quads.  Abdomen firm and non-tender.  Spoke with Charlann Boxer, NP and dulcolax suppository 10 mg 1 this am and one this pm for no bowel movement. Wife requested assistance with changing patient, incontinent of bowel on this visit.  Rectal check completed and no stool present.   ? ?CVA/right sided hemiparesis:  Therapy completed about one month ago. Worsening weakness reported by both patient and wife. Increase sleeping reported by wife.  Patient has a manual sit/stand lift that is used for transfers and a lift recliner chair.  Wife is normally able to do this independently with patient's assistance.  Today is requires 2 person max assistance.  Increase leaning towards the right-side requiring frequent assistance with repositioning. Requires 1 person assistance with dressing.   No recent falls reported.  We discussed the possibility of a hospital bed given patient's  increase in weakness and need for more support.  Patient did not wish to pursue this avenue, stating he did not think he was ready for that.  Wife will continue conversation with spouse privately.  ? ?Dysphagia:  ongoing.  Patient does not feel this has worsened but wife feels it has.  Patient endorses the need to be caution when eating to limit coughing/strangling.  ? ?Shortness of breath: Rales present to bilateral lower lobes.  Coughing reported more so during the night but non-productive.  NP has recommended mucinex 600 mg q 12 hours.   ? ?Resources: Wife advised patient has not been seen by primary due to difficulty getting to appointments.  They continue to connect virtually or telephonically with PCP.  We discussed several options for home providers.  Wife will explore options as she feels this would be more beneficial for patient.  ? ? ?HISTORY OF PRESENT ILLNESS:  Peter Ryan is a 71 y.o. 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 ?CODE STATUS: Full ?ADVANCED DIRECTIVES: No ?MOST FORM: No ?PPS: 30% ? ? ?PHYSICAL EXAM:  ? ?VITALS: ?Today's Vitals  ?  02/05/22 0902  ?BP: 140/76  ?Pulse: 89  ?Temp: (!) 97.3 ?F (36.3 ?C)  ?SpO2: 90%  ?  ?LUNGS: scattered rales bilaterally ?CARDIAC: Cor RRR}  ?EXTREMITIES: - for edema ?SKIN: Skin color, texture, turgor normal. No rashes or lesions or mobility and turgor normal  ?NEURO: positive for gait problems and weakness ? ? ? ? ? ? ?Lorenza Burton, RN ? ?

## 2022-02-06 ENCOUNTER — Telehealth: Payer: Self-pay | Admitting: *Deleted

## 2022-02-06 ENCOUNTER — Telehealth: Payer: Self-pay | Admitting: Student

## 2022-02-06 NOTE — Telephone Encounter (Signed)
Wife called stating that patient has not been seen in our office for a couple of years due to being home bound from strokes, but has been followed by home palliative care and had labs drawn and is in need of a blood transfusion. She is asking if he can have it done in our office and that the Phone call nurse was to fax his labs to Dr Janese Banks. Please advise ?

## 2022-02-06 NOTE — Telephone Encounter (Signed)
PC at 332pm. Palliative NP spoke with patient's wife regarding lab work. We discussed options and patient would like further work-up, patient's hemoglobin was 7.1. no acute bleeding. Patient reports increased fatigue, not feeling well and shortness of breath. [facility] to [facility] as he has a hx of diffuse B-cell lymphoma. She is advised to take patient to ED.  ?

## 2022-02-07 ENCOUNTER — Encounter: Payer: Self-pay | Admitting: Oncology

## 2022-02-07 ENCOUNTER — Telehealth: Payer: Self-pay | Admitting: *Deleted

## 2022-02-07 ENCOUNTER — Other Ambulatory Visit: Payer: Self-pay

## 2022-02-07 ENCOUNTER — Inpatient Hospital Stay
Admission: EM | Admit: 2022-02-07 | Discharge: 2022-02-09 | DRG: 812 | Disposition: A | Discharge status: Home / Self Care | Payer: Medicare Other | Attending: Internal Medicine | Admitting: Internal Medicine

## 2022-02-07 ENCOUNTER — Other Ambulatory Visit: Payer: Medicare Other

## 2022-02-07 DIAGNOSIS — D649 Anemia, unspecified: Secondary | ICD-10-CM | POA: Diagnosis not present

## 2022-02-07 DIAGNOSIS — K219 Gastro-esophageal reflux disease without esophagitis: Secondary | ICD-10-CM | POA: Diagnosis present

## 2022-02-07 DIAGNOSIS — E785 Hyperlipidemia, unspecified: Secondary | ICD-10-CM | POA: Diagnosis present

## 2022-02-07 DIAGNOSIS — I69351 Hemiplegia and hemiparesis following cerebral infarction affecting right dominant side: Secondary | ICD-10-CM

## 2022-02-07 DIAGNOSIS — E876 Hypokalemia: Secondary | ICD-10-CM | POA: Diagnosis not present

## 2022-02-07 DIAGNOSIS — R531 Weakness: Secondary | ICD-10-CM | POA: Diagnosis not present

## 2022-02-07 DIAGNOSIS — D509 Iron deficiency anemia, unspecified: Secondary | ICD-10-CM | POA: Diagnosis present

## 2022-02-07 DIAGNOSIS — N400 Enlarged prostate without lower urinary tract symptoms: Secondary | ICD-10-CM | POA: Diagnosis present

## 2022-02-07 DIAGNOSIS — M109 Gout, unspecified: Secondary | ICD-10-CM | POA: Diagnosis present

## 2022-02-07 DIAGNOSIS — Z8572 Personal history of non-Hodgkin lymphomas: Secondary | ICD-10-CM

## 2022-02-07 DIAGNOSIS — I10 Essential (primary) hypertension: Secondary | ICD-10-CM | POA: Diagnosis present

## 2022-02-07 DIAGNOSIS — Z833 Family history of diabetes mellitus: Secondary | ICD-10-CM | POA: Diagnosis not present

## 2022-02-07 DIAGNOSIS — E1142 Type 2 diabetes mellitus with diabetic polyneuropathy: Secondary | ICD-10-CM | POA: Diagnosis present

## 2022-02-07 DIAGNOSIS — I6930 Unspecified sequelae of cerebral infarction: Secondary | ICD-10-CM | POA: Diagnosis not present

## 2022-02-07 DIAGNOSIS — Z888 Allergy status to other drugs, medicaments and biological substances status: Secondary | ICD-10-CM

## 2022-02-07 DIAGNOSIS — K7460 Unspecified cirrhosis of liver: Secondary | ICD-10-CM | POA: Diagnosis present

## 2022-02-07 DIAGNOSIS — Z87891 Personal history of nicotine dependence: Secondary | ICD-10-CM | POA: Diagnosis not present

## 2022-02-07 DIAGNOSIS — Z8049 Family history of malignant neoplasm of other genital organs: Secondary | ICD-10-CM

## 2022-02-07 DIAGNOSIS — R0902 Hypoxemia: Secondary | ICD-10-CM | POA: Diagnosis not present

## 2022-02-07 DIAGNOSIS — G459 Transient cerebral ischemic attack, unspecified: Secondary | ICD-10-CM | POA: Diagnosis not present

## 2022-02-07 DIAGNOSIS — Z7982 Long term (current) use of aspirin: Secondary | ICD-10-CM | POA: Diagnosis not present

## 2022-02-07 DIAGNOSIS — Z79899 Other long term (current) drug therapy: Secondary | ICD-10-CM | POA: Diagnosis not present

## 2022-02-07 DIAGNOSIS — Z7401 Bed confinement status: Secondary | ICD-10-CM

## 2022-02-07 DIAGNOSIS — E119 Type 2 diabetes mellitus without complications: Secondary | ICD-10-CM

## 2022-02-07 DIAGNOSIS — D472 Monoclonal gammopathy: Secondary | ICD-10-CM

## 2022-02-07 DIAGNOSIS — R5381 Other malaise: Secondary | ICD-10-CM | POA: Diagnosis not present

## 2022-02-07 DIAGNOSIS — Z6839 Body mass index (BMI) 39.0-39.9, adult: Secondary | ICD-10-CM | POA: Diagnosis not present

## 2022-02-07 LAB — BASIC METABOLIC PANEL
Anion gap: 13 (ref 5–15)
BUN: 12 mg/dL (ref 8–23)
CO2: 26 mmol/L (ref 22–32)
Calcium: 8.9 mg/dL (ref 8.9–10.3)
Chloride: 100 mmol/L (ref 98–111)
Creatinine, Ser: 0.91 mg/dL (ref 0.61–1.24)
GFR, Estimated: >60 mL/min (ref >60)
Glucose, Bld: 221 mg/dL — ABNORMAL HIGH (ref 70–99)
Potassium: 2.4 mmol/L — CL (ref 3.5–5.1)
Sodium: 139 mmol/L (ref 135–145)

## 2022-02-07 LAB — HEPATIC FUNCTION PANEL
ALT: 11 U/L (ref 0–44)
AST: 21 U/L (ref 15–41)
Albumin: 3.4 g/dL — ABNORMAL LOW (ref 3.5–5.0)
Alkaline Phosphatase: 75 U/L (ref 38–126)
Bilirubin, Direct: <0.1 mg/dL (ref 0.0–0.2)
Total Bilirubin: 0.7 mg/dL (ref 0.3–1.2)
Total Protein: 7.5 g/dL (ref 6.5–8.1)

## 2022-02-07 LAB — CBC
HCT: 27.6 % — ABNORMAL LOW (ref 39.0–52.0)
HCT: 26.5 % — ABNORMAL LOW (ref 39.0–52.0)
Hemoglobin: 7 g/dL — ABNORMAL LOW (ref 13.0–17.0)
Hemoglobin: 6.7 g/dL — ABNORMAL LOW (ref 13.0–17.0)
MCH: 16.8 pg — ABNORMAL LOW (ref 26.0–34.0)
MCH: 17 pg — ABNORMAL LOW (ref 26.0–34.0)
MCHC: 25.4 g/dL — ABNORMAL LOW (ref 30.0–36.0)
MCHC: 25.3 g/dL — ABNORMAL LOW (ref 30.0–36.0)
MCV: 66.2 fL — ABNORMAL LOW (ref 80.0–100.0)
MCV: 67.1 fL — ABNORMAL LOW (ref 80.0–100.0)
Platelets: 540 10*3/uL — ABNORMAL HIGH (ref 150–400)
Platelets: 499 10*3/uL — ABNORMAL HIGH (ref 150–400)
RBC: 4.17 MIL/uL — ABNORMAL LOW (ref 4.22–5.81)
RBC: 3.95 MIL/uL — ABNORMAL LOW (ref 4.22–5.81)
RDW: 20.5 % — ABNORMAL HIGH (ref 11.5–15.5)
RDW: 20.5 % — ABNORMAL HIGH (ref 11.5–15.5)
WBC: 7.5 10*3/uL (ref 4.0–10.5)
WBC: 7.6 10*3/uL (ref 4.0–10.5)
nRBC: 0 % (ref 0.0–0.2)
nRBC: 0 % (ref 0.0–0.2)

## 2022-02-07 LAB — RETICULOCYTES
Immature Retic Fract: 30.3 % — ABNORMAL HIGH (ref 2.3–15.9)
RBC.: 4.17 MIL/uL — ABNORMAL LOW (ref 4.22–5.81)
Retic Count, Absolute: 104.7 10*3/uL (ref 19.0–186.0)
Retic Ct Pct: 2.5 % (ref 0.4–3.1)

## 2022-02-07 LAB — ABO/RH: ABO/RH(D): O POS

## 2022-02-07 LAB — IRON AND TIBC
Iron: 10 ug/dL — ABNORMAL LOW (ref 45–182)
Saturation Ratios: 3 % — ABNORMAL LOW (ref 17.9–39.5)
TIBC: 368 ug/dL (ref 250–450)
UIBC: 358 ug/dL

## 2022-02-07 LAB — GLUCOSE, CAPILLARY: Glucose-Capillary: 175 mg/dL — ABNORMAL HIGH (ref 70–99)

## 2022-02-07 LAB — URINALYSIS, ROUTINE W REFLEX MICROSCOPIC
Bilirubin Urine: NEGATIVE
Glucose, UA: NEGATIVE mg/dL
Hgb urine dipstick: NEGATIVE
Ketones, ur: NEGATIVE mg/dL
Leukocytes,Ua: NEGATIVE
Nitrite: NEGATIVE
Protein, ur: NEGATIVE mg/dL
Specific Gravity, Urine: 1.013 (ref 1.005–1.030)
pH: 5 (ref 5.0–8.0)

## 2022-02-07 LAB — PROTIME-INR
INR: 1.1 (ref 0.8–1.2)
Prothrombin Time: 14 seconds (ref 11.4–15.2)

## 2022-02-07 LAB — MAGNESIUM: Magnesium: 1.7 mg/dL (ref 1.7–2.4)

## 2022-02-07 LAB — FERRITIN: Ferritin: 16 ng/mL — ABNORMAL LOW (ref 24–336)

## 2022-02-07 LAB — PREPARE RBC (CROSSMATCH)

## 2022-02-07 LAB — CBG MONITORING, ED: Glucose-Capillary: 155 mg/dL — ABNORMAL HIGH (ref 70–99)

## 2022-02-07 MED ORDER — ONDANSETRON HCL 4 MG PO TABS
4.0000 mg | ORAL_TABLET | Freq: Four times a day (QID) | ORAL | Status: DC | PRN
  Administered 2022-02-09: 4 mg via ORAL
  Filled 2022-02-07: qty 1

## 2022-02-07 MED ORDER — ACETAMINOPHEN 325 MG PO TABS: 650.0000 mg | ORAL_TABLET | ORAL | Status: DC | PRN

## 2022-02-07 MED ORDER — VITAMIN D 25 MCG (1000 UNIT) PO TABS
1000.0000 [IU] | ORAL_TABLET | Freq: Every day | ORAL | Status: DC
  Administered 2022-02-08 – 2022-02-09 (×2): 1000 [IU] via ORAL
  Filled 2022-02-07 (×2): qty 1

## 2022-02-07 MED ORDER — LOSARTAN POTASSIUM 50 MG PO TABS
100.0000 mg | ORAL_TABLET | Freq: Every day | ORAL | Status: DC
  Administered 2022-02-08 – 2022-02-09 (×2): 100 mg via ORAL
  Filled 2022-02-07 (×2): qty 2

## 2022-02-07 MED ORDER — CLONIDINE HCL 0.1 MG PO TABS: 0.1000 mg | ORAL_TABLET | Freq: Two times a day (BID) | ORAL | Status: DC

## 2022-02-07 MED ORDER — ALLOPURINOL 300 MG PO TABS
300.0000 mg | ORAL_TABLET | Freq: Every day | ORAL | Status: DC
  Administered 2022-02-08 – 2022-02-09 (×2): 300 mg via ORAL
  Filled 2022-02-07 (×2): qty 1

## 2022-02-07 MED ORDER — POTASSIUM CHLORIDE 10 MEQ/100ML IV SOLN
10.0000 meq | Freq: Once | INTRAVENOUS | Status: AC
Start: 2022-02-07 — End: 2022-02-07
  Administered 2022-02-07: 10 meq via INTRAVENOUS
  Filled 2022-02-07: qty 100

## 2022-02-07 MED ORDER — AMLODIPINE BESYLATE 5 MG PO TABS
10.0000 mg | ORAL_TABLET | Freq: Every day | ORAL | Status: DC
  Administered 2022-02-07 – 2022-02-09 (×3): 10 mg via ORAL
  Filled 2022-02-07 (×3): qty 2

## 2022-02-07 MED ORDER — POLYETHYLENE GLYCOL 3350 17 G PO PACK
17.0000 g | PACK | Freq: Every day | ORAL | Status: DC
  Filled 2022-02-07 (×2): qty 1

## 2022-02-07 MED ORDER — POTASSIUM CHLORIDE CRYS ER 20 MEQ PO TBCR
40.0000 meq | EXTENDED_RELEASE_TABLET | Freq: Three times a day (TID) | ORAL | Status: DC
  Administered 2022-02-07: 40 meq via ORAL
  Filled 2022-02-07: qty 2

## 2022-02-07 MED ORDER — INSULIN ASPART 100 UNIT/ML IJ SOLN
0.0000 [IU] | Freq: Three times a day (TID) | INTRAMUSCULAR | Status: DC
  Administered 2022-02-07 – 2022-02-09 (×6): 3 [IU] via SUBCUTANEOUS
  Filled 2022-02-07 (×6): qty 1

## 2022-02-07 MED ORDER — ONDANSETRON HCL 4 MG/2ML IJ SOLN
4.0000 mg | Freq: Four times a day (QID) | INTRAMUSCULAR | Status: DC | PRN
  Filled 2022-02-07: qty 2

## 2022-02-07 MED ORDER — VITAMIN B-12 1000 MCG PO TABS
1000.0000 ug | ORAL_TABLET | Freq: Every day | ORAL | Status: DC
  Administered 2022-02-08 – 2022-02-09 (×2): 1000 ug via ORAL
  Filled 2022-02-07 (×2): qty 1

## 2022-02-07 MED ORDER — FINASTERIDE 5 MG PO TABS
5.0000 mg | ORAL_TABLET | Freq: Every day | ORAL | Status: DC
  Administered 2022-02-08 – 2022-02-09 (×2): 5 mg via ORAL
  Filled 2022-02-07 (×2): qty 1

## 2022-02-07 MED ORDER — SODIUM CHLORIDE 0.9 % IV SOLN: 10.0000 mL/h | Freq: Once | INTRAVENOUS | Status: DC

## 2022-02-07 MED ORDER — ATORVASTATIN CALCIUM 20 MG PO TABS
80.0000 mg | ORAL_TABLET | Freq: Every day | ORAL | Status: DC
  Administered 2022-02-08 – 2022-02-09 (×2): 80 mg via ORAL
  Filled 2022-02-07 (×2): qty 4

## 2022-02-07 NOTE — Telephone Encounter (Signed)
Pts wife returned call and stated she spoke to [facility] this morning and was advised to go to ER due to pts mobility and not being to sit for to long. Pts wife said they were getting ready to head to the ER to receive the blood transfusion. Anderson Malta will cancel appointments. ?

## 2022-02-07 NOTE — Assessment & Plan Note (Signed)
And isPatient presents for evaluation of worsening weakness to have a hemoglobin of 7 g/dl ?Serum iron levels are low as well ?We will transfuse 2 units of packed RBC ?Start patient on iron supplements ? ?

## 2022-02-07 NOTE — Telephone Encounter (Signed)
I sent a secure chat back to Vance Gather nurse with palliative care and let her know that pt could not get blood at cancer center due to his home bound status. I also called the wife this morning and told her why and she did ask me where do you go to get . She said ER and I told her that it seems like the best way. He would have to be seen by someone and make sure the labs meets criteria.  She says she had taken him to ER in the past  for other things ?

## 2022-02-07 NOTE — Telephone Encounter (Signed)
Per Mitzi at [facility] it is the pts choice if they decide to go to the ER. Also stated pt is a palliative care pt and they would be the ones to pay for the visit not hospice. Explained that wife stated they are heading to ER. Mitzi understands plan.  ?

## 2022-02-07 NOTE — Assessment & Plan Note (Signed)
Complicates overall prognosis and care 

## 2022-02-07 NOTE — H&P (Signed)
Wellness ?History and Physical  ? ? ?Patient: Peter Ryan FYB:[id number] DOB: March 19, 1951 ?DOA: 02/07/2022 ?DOS: the patient was seen and examined on 02/07/2022 ?PCP: [name]., MD  ?Patient coming from: Home ? ?Chief Complaint:  ?Chief Complaint  ?Patient presents with  ? Abnormal Lab  ? ?Most of the history was obtained from patient's wife at the bedside ?HPI: Peter Ryan is a 71 y.o. male with medical history significant for CVA with right-sided hemiparesis, hypertension, diabetes mellitus, bedbound status, history of non-Hodgkin's lymphoma who was sent to the ER for evaluation of symptomatic anemia. ?Patient's wife states that he has become increasingly weak and unable to assist with his care like he used to. ?She has a lift chair that he is normally able to get into for transfers but he has not been able to do that for the last couple of weeks due to increased weakness.  He was seen by the palliative care nurse who ordered some blood test and patient was found to have a hemoglobin of 7 g/dl. ?He denies having any rectal bleeding, no hematemesis, no hematochezia, no hematuria. ?He denies having any chest pain, no shortness of breath, no nausea, no vomiting, no changes in his bowel habits, no dizziness, no lightheadedness. ? ? ? ? ?Review of Systems: As mentioned in the history of present illness. All other systems reviewed and are negative. ?Past Medical History:  ?Diagnosis Date  ? Arthritis   ? BPH (benign prostatic hyperplasia)   ? Cancer [facility])   ? Non-Hodgkins lymphoma  ? Chronic prostatitis   ? Cirrhosis of liver ([place])   ? Diabetes mellitus without complication ([place])   ? Dyslipidemia   ? Elevated PSA   ? Gastric reflux   ? Gout   ? HTN (hypertension)   ? Hyperlipidemia   ? Neuropathy   ? Over weight   ? Psoriasis   ? Testicular pain, right   ? Urinary frequency   ? ?Past Surgical History:  ?Procedure Laterality Date  ? hydrocelectomy    ? IR FLUORO GUIDE PORT INSERTION RIGHT  04/11/2017  ? IR  REMOVAL TUN ACCESS W/ PORT W/O FL MOD SED  05/29/2018  ? PILONIDAL CYST EXCISION    ? TEE WITHOUT CARDIOVERSION N/A 06/09/2020  ? Procedure: TRANSESOPHAGEAL ECHOCARDIOGRAM (TEE);  Surgeon: Minna Merritts, MD;  Location: [facility] ORS;  Service: Cardiovascular;  Laterality: N/A;  ? ?Social History:  reports that he quit smoking about 47 years ago. His smoking use included cigarettes. He has a 12.00 pack-year smoking history. He has never used smokeless tobacco. He reports that he does not drink alcohol and does not use drugs. ? ?Allergies  ?Allergen Reactions  ? Hctz [Hydrochlorothiazide] Other (See Comments)  ?  Gout flare  ? Metformin Other (See Comments)  ?  Stomach upset ?  ? ? ?Family History  ?Problem Relation Age of Onset  ? Uterine cancer Mother   ? Diabetes Mother   ? Cancer Mother   ? Cancer Maternal Uncle   ? Bladder Cancer Neg Hx   ? Kidney disease Neg Hx   ? Prostate cancer Neg Hx   ? ? ?Prior to Admission medications   ?Medication Sig Start Date End Date Taking? Authorizing Provider  ?acetaminophen (TYLENOL) 325 MG tablet Take 2 tablets (650 mg total) by mouth every 4 (four) hours as needed for mild pain (or temp > 37.5 C (99.5 F)). 07/10/20   Angiulli, Lavon Paganini, PA-C  ?allopurinol (ZYLOPRIM) 300 MG tablet TAKE ONE  TABLET EVERY DAY 06/11/21   [name]., MD  ?amLODipine (NORVASC) 10 MG tablet TAKE ONE TABLET BY MOUTH EVERY DAY 08/08/21   [name]., MD  ?aspirin EC 325 MG EC tablet Take 1 tablet (325 mg total) by mouth daily. 06/15/20   Sidney Ace, MD  ?atorvastatin (LIPITOR) 80 MG tablet TAKE ONE TABLET (80 MG) BY MOUTH EVERY DAY 07/09/21   [name]., MD  ?Cholecalciferol (VITAMIN D3) 25 MCG (1000 UT) CAPS Take 1 capsule (1,000 Units total) by mouth daily. 07/10/20   Angiulli, Lavon Paganini, PA-C  ?cloNIDine (CATAPRES) 0.1 MG tablet TAKE ONE (1) TABLET BY MOUTH TWO TIMES PER DAY 08/22/21   [name]., MD  ?dapagliflozin propanediol (FARXIGA) 10 MG TABS  tablet Take 1 tablet (10 mg total) by mouth daily. ?Patient not taking: Reported on 08/21/2021 03/20/21   [name]., MD  ?finasteride (PROSCAR) 5 MG tablet Take 1 tablet (5 mg total) by mouth daily. 03/01/21   Zara Council A, PA-C  ?hydrocortisone 2.5 % lotion Apply topically as directed. Apply to affected areas as needed up to three times weekly. 12/25/20   Ralene Bathe, MD  ?ketoconazole (NIZORAL) 2 % shampoo Apply 1 application topically as directed. Apply small amount every other day to affected areas. Let sit several minutes before rinsing. 12/25/20   Ralene Bathe, MD  ?losartan (COZAAR) 100 MG tablet TAKE ONE TABLET EVERY DAY BY MOUTH 08/22/21   [name]., MD  ?polyethylene glycol (MIRALAX / GLYCOLAX) 17 g packet Take 17 g by mouth daily. 07/11/20   Angiulli, Lavon Paganini, PA-C  ?vitamin B-12 (CYANOCOBALAMIN) 1000 MCG tablet Take 1 tablet (1,000 mcg total) by mouth daily. 09/04/20   [name]., MD  ? ? ?Physical Exam: ?Vitals:  ? 02/07/22 1400 02/07/22 1415 02/07/22 1430 02/07/22 1445  ?BP: 135/71  140/67   ?Pulse: 83 89 85 85  ?Resp: 19 (!) 24 (!) 22 (!) 21  ?Temp:      ?TempSrc:      ?SpO2: 96% 98% 99% 96%  ? ?Physical Exam ?Vitals and nursing note reviewed.  ?Constitutional:   ?   Appearance: He is obese.  ?HENT:  ?   Head: Normocephalic.  ?   Nose: Nose normal.  ?   Mouth/Throat:  ?   Mouth: Mucous membranes are moist.  ?Eyes:  ?   Comments: Pale conjunctiva  ?Cardiovascular:  ?   Rate and Rhythm: Normal rate and regular rhythm.  ?Pulmonary:  ?   Effort: Pulmonary effort is normal.  ?   Breath sounds: Normal breath sounds.  ?Abdominal:  ?   General: Abdomen is flat. Bowel sounds are normal.  ?   Palpations: Abdomen is soft.  ?Musculoskeletal:     ?   General: Normal range of motion.  ?   Cervical back: Normal range of motion and neck supple.  ?Skin: ?   General: Skin is warm and dry.  ?Neurological:  ?   Mental Status: He is alert and oriented to person, place, and  time.  ?   Motor: Weakness present.  ?Psychiatric:     ?   Mood and Affect: Mood normal.     ?   Behavior: Behavior normal.  ? ? ?Data Reviewed: ?Relevant notes from primary care and specialist visits, past discharge summaries as available in EHR, including Care Everywhere. ?Prior diagnostic testing as pertinent to current admission diagnoses ?Updated medications  and problem lists for reconciliation ?ED course, including vitals, labs, imaging, treatment and response to treatment ?Triage notes, nursing and pharmacy notes and ED provider's notes ?Notable results as noted in HPI ?Labs reviewed and essentially negative except for potassium 2.4, glucose 221, BUN 12, creatinine 0.91, serum iron 10, ferritin 16, white count 7.5, hemoglobin 7.0, hematocrit 27.6, MCV 66.2, platelet count 540 ?Twelve-lead EKG reviewed by me shows normal sinus rhythm.  Nonspecific T wave changes ?There are no new results to review at this time. ? ?Assessment and Plan: ?* Symptomatic anemia ?And isPatient presents for evaluation of worsening weakness to have a hemoglobin of 7 g/dl ?Serum iron levels are low as well ?We will transfuse 2 units of packed RBC ?Start patient on iron supplements ? ? ?Hypokalemia ?Supplement potassium ?Check magnesium levels ? ?Type 2 diabetes mellitus ([place]) ?Maintain consistent carbohydrate diet ?Glycemic control with sliding scale insulin ? ?History of stroke with residual deficit ?Patient has a history of CVA with right-sided hemiparesis ?He will require assistance with some activities of daily living ?Continue atorvastatin ?Hold aspirin ? ?Benign essential hypertension ?Blood pressure is stable ?Continue amlodipine and Cozaar ? ?Morbid obesity ([place]) ?Complicates overall prognosis and care ? ? ? ? ? Advance Care Planning:   Code Status: Full Code  ? ?Consults: None ? ?Family Communication: Greater than 50% of time was spent discussing patient's condition and plan of care with him and his wife at the bedside.  All  questions and concerns have been addressed.  CODE STATUS was discussed and he wishes to be a full code. ? ?Severity of Illness: ?The appropriate patient status for this patient is OBSERVATION. Observatio

## 2022-02-07 NOTE — Telephone Encounter (Signed)
Reached out to pts wife to offer a lab appointment today at 11:30am with possile transfussion tomorrow at 8:30am since pt is needing a transfussion per pallative care. Pt nor pts wife answered; left a VM explaining situation and if they could return my call asap.  ?

## 2022-02-07 NOTE — Assessment & Plan Note (Signed)
Patient has a history of CVA with right-sided hemiparesis ?He will require assistance with some activities of daily living ?Continue atorvastatin ?Hold aspirin ?

## 2022-02-07 NOTE — ED Notes (Signed)
Called lab to inquire about results for pt. Lab states they are running now. ?

## 2022-02-07 NOTE — Telephone Encounter (Signed)
Called wife earlier and let her know that we will not be able to give blood transfusion to the pt. The patient is bed bound and unable to help get up from prior strokes. We do give patients transfusions but they have to able to get in and out of wheelchair with minimal asst. And we only have 1 bed that we reserve for bladder pt's but they are minimal asst too. She thanked me for the info. She thought she may have to send him to the ER and she usually uses EMS because she can't get them in the car. ?

## 2022-02-07 NOTE — Telephone Encounter (Signed)
Peter Ryan his hb is 7.1. looks like iron deficiency. He needs to come labs cbc hold tube ferritin and iron studies and we will go from there.

## 2022-02-07 NOTE — Assessment & Plan Note (Signed)
Blood pressure is stable ?Continue amlodipine and Cozaar ?

## 2022-02-07 NOTE — ED Notes (Signed)
Received report from [place], report given to the floor, transport set up, pt to be taken to the floor. ?

## 2022-02-07 NOTE — Telephone Encounter (Signed)
Is hospice ok with pt going to ER for transfusion?

## 2022-02-07 NOTE — ED Notes (Signed)
Dr.agbata at bedside ?

## 2022-02-07 NOTE — ED Triage Notes (Signed)
Pt comes into the ED via EMS from home with c/o low Hgb and generalized weakness for the past week, pt is bed bound at baseline ? ?CBG246 ?142/70 ?98%RA ?HR97%RA ?

## 2022-02-07 NOTE — ED Provider Notes (Signed)
? ? ?[facility] ?Emergency Department Provider Note ? ? ? ? Event Date/Time  ? First MD Initiated Contact with Patient 02/07/22 1300   ?  (approximate) ? ? ?History  ? ?Abnormal Lab ? ? ?HPI ? ?Peter Ryan is a 71 y.o. male with a history of non-Hodgkin's lymphoma, type 2 diabetes, hyperlipidemia, hypertension, obesity, cirrhosis and CVA with right hemiparesis, presents to the ED for evaluation of generalized weakness.  Patient reports from home with complaints of low hemoglobin and symptoms for the last week.  Patient is under the care of the cancer center, and has palliative care at home.  According to the chart review, patient and his wife have been in contact with the oncology office as well as the palliative care nurse, and the patient has had some increased fatigue, malaise, and shortness of breath.  No reports of any active GI bleeding. ? ? ?Physical Exam  ? ?Triage Vital Signs: ?ED Triage Vitals [02/07/22 1132]  ?Enc Vitals Group  ?   BP 119/68  ?   Pulse Rate 92  ?   Resp 18  ?   Temp 98.1 ?F (36.7 ?C)  ?   Temp Source Oral  ?   SpO2 94 %  ?   Weight   ?   Height   ?   Head Circumference   ?   Peak Flow   ?   Pain Score   ?   Pain Loc   ?   Pain Edu?   ?   Excl. in [place]?   ? ? ?Most recent vital signs: ?Vitals:  ? 02/07/22 1430 02/07/22 1445  ?BP: 140/67   ?Pulse: 85 85  ?Resp: (!) 22 (!) 21  ?Temp:    ?SpO2: 99% 96%  ? ? ?General Awake, no distress. A&O ?HEENT NCAT. PERRL. EOMI. No rhinorrhea. Mucous membranes are moist. ?CV:  Good peripheral perfusion. RRR ?RESP:  Normal effort. CTA ?ABD:  No distention. Soft, nontender. Heme-negative stool ? ? ?ED Results / Procedures / Treatments  ? ?Labs ?(all labs ordered are listed, but only abnormal results are displayed) ?Labs Reviewed  ?BASIC METABOLIC PANEL - Abnormal; Notable for the following components:  ?    Result Value  ? Potassium 2.4 (*)   ? Glucose, Bld 221 (*)   ? All other components within normal limits  ?CBC - Abnormal;  Notable for the following components:  ? RBC 4.17 (*)   ? Hemoglobin 7.0 (*)   ? HCT 27.6 (*)   ? MCV 66.2 (*)   ? MCH 16.8 (*)   ? MCHC 25.4 (*)   ? RDW 20.5 (*)   ? Platelets 540 (*)   ? All other components within normal limits  ?URINALYSIS, ROUTINE W REFLEX MICROSCOPIC - Abnormal; Notable for the following components:  ? Color, Urine YELLOW (*)   ? APPearance CLEAR (*)   ? All other components within normal limits  ?RETICULOCYTES - Abnormal; Notable for the following components:  ? RBC. 4.17 (*)   ? Immature Retic Fract 30.3 (*)   ? All other components within normal limits  ?FERRITIN - Abnormal; Notable for the following components:  ? Ferritin 16 (*)   ? All other components within normal limits  ?IRON AND TIBC - Abnormal; Notable for the following components:  ? Iron 10 (*)   ? Saturation Ratios 3 (*)   ? All other components within normal limits  ?HEPATIC FUNCTION PANEL - Abnormal; Notable for the  following components:  ? Albumin 3.4 (*)   ? All other components within normal limits  ?PROTIME-INR  ?CBC  ?CBG MONITORING, ED  ?TYPE AND SCREEN  ?PREPARE RBC (CROSSMATCH)  ?ABO/RH  ? ? ? ?EKG ? ?Vent. rate 91 BPM ?PR interval 148 ms ?QRS duration 72 ms ?QT/QTcB 426/523 ms ?P-R-T axes 31 12 61 ? ?RADIOLOGY ? ? ?No results found. ? ? ?PROCEDURES: ? ?Critical Care performed: Yes, see critical care procedure note(s) ? ?Procedures ?CRITICAL CARE ?Performed by: Melvenia Needles ? ? ?Total critical care time: 45 minutes ? ?Critical care time was exclusive of separately billable procedures and treating other patients. ? ?Critical care was necessary to treat or prevent imminent or life-threatening deterioration. ? ?Critical care was time spent personally by me on the following activities: development of treatment plan with patient and/or surrogate as well as nursing, discussions with consultants, evaluation of patient's response to treatment, examination of patient, obtaining history from patient or surrogate,  ordering and performing treatments and interventions, ordering and review of laboratory studies, ordering and review of radiographic studies, pulse oximetry and re-evaluation of patient's condition. ? ? ?MEDICATIONS ORDERED IN ED: ?Medications  ?0.9 %  sodium chloride infusion (has no administration in time range)  ?potassium chloride 10 mEq in 100 mL IVPB (10 mEq Intravenous New Bag/Given 02/07/22 1505)  ? ? ? ?IMPRESSION / MDM / ASSESSMENT AND PLAN / ED COURSE  ?I reviewed the triage vital signs and the nursing notes. ?             ?               ? ?Differential diagnosis includes, but is not limited to, anemia of chronic disease, GI bleed, trauma, infection ? ?Patient with a history of non-Hodgkin's lymphoma, CVA, hemiparesis, and cirrhosis presents with 2 weeks of weakness and reports of low Hgb from the oncology office.  Patient is evaluated for his complaints, and found to have a H&H at 7 and 27.6, respectively.  Chart review reveals his last known normal H&H 1 year ago at 18 and 42, respectively.  Patient was also found to have evidence of a hypokalemia at 2.4 and a decrease ferritin at 16 with a normal reticulocyte count with increased immature retakes.  Discussed treatment possibilities with the patient along with his wife, and they are agreeable to the plan to treat the acute anemia with blood transfusion.  You are also agreeable to admission for continued serial blood test and correction of his hypokalemia.  Patient's diagnosis is consistent with hypokalemia and symptomatic anemia, in a patient with non-Hodgkin's lymphoma on palliative care. Patient will be d admitted to the hospital service as discussed with Dr. Francine Graven. ?  ? ? ?FINAL CLINICAL IMPRESSION(S) / ED DIAGNOSES  ? ?Final diagnoses:  ?Symptomatic anemia  ?Hypokalemia  ? ? ? ?Rx / DC Orders  ? ?ED Discharge Orders   ? ? None  ? ?  ? ? ? ?Note:  This document was prepared using Dragon voice recognition software and may include unintentional  dictation errors. ? ?  ?Melvenia Needles, PA-C ?02/07/22 1614 ? ?  ?Delman Kitten, MD ?02/12/22 1521 ? ?

## 2022-02-07 NOTE — Assessment & Plan Note (Signed)
Maintain consistent carbohydrate diet Glycemic control with sliding scale insulin 

## 2022-02-07 NOTE — ED Notes (Signed)
Paper copy of blood consent signed by patient at this time. 

## 2022-02-07 NOTE — Assessment & Plan Note (Signed)
Supplement potassium Check magnesium levels 

## 2022-02-08 ENCOUNTER — Ambulatory Visit: Payer: Medicare Other

## 2022-02-08 DIAGNOSIS — Z7982 Long term (current) use of aspirin: Secondary | ICD-10-CM | POA: Diagnosis not present

## 2022-02-08 DIAGNOSIS — R531 Weakness: Secondary | ICD-10-CM | POA: Diagnosis not present

## 2022-02-08 DIAGNOSIS — Z8572 Personal history of non-Hodgkin lymphomas: Secondary | ICD-10-CM | POA: Diagnosis not present

## 2022-02-08 DIAGNOSIS — Z6839 Body mass index (BMI) 39.0-39.9, adult: Secondary | ICD-10-CM | POA: Diagnosis not present

## 2022-02-08 DIAGNOSIS — E876 Hypokalemia: Secondary | ICD-10-CM | POA: Diagnosis present

## 2022-02-08 DIAGNOSIS — Z79899 Other long term (current) drug therapy: Secondary | ICD-10-CM | POA: Diagnosis not present

## 2022-02-08 DIAGNOSIS — D649 Anemia, unspecified: Secondary | ICD-10-CM | POA: Diagnosis not present

## 2022-02-08 DIAGNOSIS — K219 Gastro-esophageal reflux disease without esophagitis: Secondary | ICD-10-CM | POA: Diagnosis present

## 2022-02-08 DIAGNOSIS — I6930 Unspecified sequelae of cerebral infarction: Secondary | ICD-10-CM | POA: Diagnosis not present

## 2022-02-08 DIAGNOSIS — I69351 Hemiplegia and hemiparesis following cerebral infarction affecting right dominant side: Secondary | ICD-10-CM | POA: Diagnosis not present

## 2022-02-08 DIAGNOSIS — Z888 Allergy status to other drugs, medicaments and biological substances status: Secondary | ICD-10-CM | POA: Diagnosis not present

## 2022-02-08 DIAGNOSIS — I10 Essential (primary) hypertension: Secondary | ICD-10-CM | POA: Diagnosis present

## 2022-02-08 DIAGNOSIS — Z8049 Family history of malignant neoplasm of other genital organs: Secondary | ICD-10-CM | POA: Diagnosis not present

## 2022-02-08 DIAGNOSIS — Z87891 Personal history of nicotine dependence: Secondary | ICD-10-CM | POA: Diagnosis not present

## 2022-02-08 DIAGNOSIS — K7460 Unspecified cirrhosis of liver: Secondary | ICD-10-CM | POA: Diagnosis present

## 2022-02-08 DIAGNOSIS — N400 Enlarged prostate without lower urinary tract symptoms: Secondary | ICD-10-CM | POA: Diagnosis present

## 2022-02-08 DIAGNOSIS — Z7401 Bed confinement status: Secondary | ICD-10-CM | POA: Diagnosis not present

## 2022-02-08 DIAGNOSIS — G459 Transient cerebral ischemic attack, unspecified: Secondary | ICD-10-CM | POA: Diagnosis not present

## 2022-02-08 DIAGNOSIS — E785 Hyperlipidemia, unspecified: Secondary | ICD-10-CM | POA: Diagnosis present

## 2022-02-08 DIAGNOSIS — Z833 Family history of diabetes mellitus: Secondary | ICD-10-CM | POA: Diagnosis not present

## 2022-02-08 DIAGNOSIS — D509 Iron deficiency anemia, unspecified: Secondary | ICD-10-CM | POA: Diagnosis present

## 2022-02-08 DIAGNOSIS — E1142 Type 2 diabetes mellitus with diabetic polyneuropathy: Secondary | ICD-10-CM | POA: Diagnosis present

## 2022-02-08 DIAGNOSIS — M109 Gout, unspecified: Secondary | ICD-10-CM | POA: Diagnosis present

## 2022-02-08 LAB — BASIC METABOLIC PANEL
Anion gap: 12 (ref 5–15)
BUN: 13 mg/dL (ref 8–23)
CO2: 26 mmol/L (ref 22–32)
Calcium: 8.7 mg/dL — ABNORMAL LOW (ref 8.9–10.3)
Chloride: 105 mmol/L (ref 98–111)
Creatinine, Ser: 0.8 mg/dL (ref 0.61–1.24)
GFR, Estimated: >60 mL/min (ref >60)
Glucose, Bld: 169 mg/dL — ABNORMAL HIGH (ref 70–99)
Potassium: 2.4 mmol/L — CL (ref 3.5–5.1)
Sodium: 143 mmol/L (ref 135–145)

## 2022-02-08 LAB — HEMOGLOBIN A1C
Hgb A1c MFr Bld: 7.3 % — ABNORMAL HIGH (ref 4.8–5.6)
Mean Plasma Glucose: 162.81 mg/dL

## 2022-02-08 LAB — TYPE AND SCREEN
ABO/RH(D): O POS
Antibody Screen: NEGATIVE
Unit division: 0

## 2022-02-08 LAB — GLUCOSE, CAPILLARY
Glucose-Capillary: 170 mg/dL — ABNORMAL HIGH (ref 70–99)
Glucose-Capillary: 168 mg/dL — ABNORMAL HIGH (ref 70–99)
Glucose-Capillary: 165 mg/dL — ABNORMAL HIGH (ref 70–99)
Glucose-Capillary: 180 mg/dL — ABNORMAL HIGH (ref 70–99)

## 2022-02-08 LAB — CBC
HCT: 28.7 % — ABNORMAL LOW (ref 39.0–52.0)
Hemoglobin: 7.8 g/dL — ABNORMAL LOW (ref 13.0–17.0)
MCH: 18 pg — ABNORMAL LOW (ref 26.0–34.0)
MCHC: 27.2 g/dL — ABNORMAL LOW (ref 30.0–36.0)
MCV: 66.3 fL — ABNORMAL LOW (ref 80.0–100.0)
Platelets: 456 10*3/uL — ABNORMAL HIGH (ref 150–400)
RBC: 4.33 MIL/uL (ref 4.22–5.81)
RDW: 21.9 % — ABNORMAL HIGH (ref 11.5–15.5)
WBC: 9.7 10*3/uL (ref 4.0–10.5)
nRBC: 0 % (ref 0.0–0.2)

## 2022-02-08 LAB — BPAM RBC
Blood Product Expiration Date: 202306202359
ISSUE DATE / TIME: 202305111721
Unit Type and Rh: 5100

## 2022-02-08 LAB — MAGNESIUM: Magnesium: 1.7 mg/dL (ref 1.7–2.4)

## 2022-02-08 LAB — HIV ANTIBODY (ROUTINE TESTING W REFLEX): HIV Screen 4th Generation wRfx: NONREACTIVE

## 2022-02-08 LAB — POTASSIUM: Potassium: 2.6 mmol/L — CL (ref 3.5–5.1)

## 2022-02-08 MED ORDER — POTASSIUM CHLORIDE CRYS ER 20 MEQ PO TBCR
40.0000 meq | EXTENDED_RELEASE_TABLET | Freq: Once | ORAL | Status: AC
  Administered 2022-02-08: 40 meq via ORAL
  Filled 2022-02-08: qty 2

## 2022-02-08 MED ORDER — MAGNESIUM SULFATE 2 GM/50ML IV SOLN
2.0000 g | Freq: Once | INTRAVENOUS | Status: AC
  Administered 2022-02-08: 2 g via INTRAVENOUS
  Filled 2022-02-08: qty 50

## 2022-02-08 MED ORDER — POTASSIUM CHLORIDE 10 MEQ/100ML IV SOLN
10.0000 meq | INTRAVENOUS | Status: AC
  Administered 2022-02-08 – 2022-02-09 (×4): 10 meq via INTRAVENOUS
  Filled 2022-02-08 (×4): qty 100

## 2022-02-08 MED ORDER — SODIUM CHLORIDE 0.9 % IV SOLN
200.0000 mg | Freq: Every day | INTRAVENOUS | Status: AC
  Administered 2022-02-08 – 2022-02-09 (×2): 200 mg via INTRAVENOUS
  Filled 2022-02-08 (×2): qty 10

## 2022-02-08 MED ORDER — POTASSIUM CHLORIDE 10 MEQ/100ML IV SOLN
10.0000 meq | INTRAVENOUS | Status: AC
  Administered 2022-02-08 (×4): 10 meq via INTRAVENOUS
  Filled 2022-02-08 (×4): qty 100

## 2022-02-08 NOTE — Progress Notes (Signed)
? ? ? ?Progress Note  ? ? ?Peter Ryan  AJO:[id number] DOB: 1951-03-04  DOA: 02/07/2022 ?PCP: [name]., MD  ? ? ? ? ?Brief Narrative:  ? ? ?Medical records reviewed and are as summarized below: ? ?Peter Ryan is a 71 y.o. male  with medical history significant for CVA with right-sided hemiparesis, hypertension, diabetes mellitus, bedbound status, history of non-Hodgkin's lymphoma who was sent to the ER for evaluation of symptomatic anemia.  He complained of generalized weakness. ? ? ? ? ?Assessment/Plan:  ? ?Principal Problem: ?  Symptomatic anemia ?Active Problems: ?  Hypokalemia ?  Type 2 diabetes mellitus ([place]) ?  History of stroke with residual deficit ?  Benign essential hypertension ?  Morbid obesity ([place]) ? ? ? ?Body mass index is 39.29 kg/m?.  (Obesity) ? ?Symptomatic anemia, iron deficiency anemia: S/p transfusion with 1 unit of PRBCs.  Start IV iron infusion today.  Patient and his wife are agreeable to treatment.  Monitor H&H.  Recommended colonoscopy as an outpatient for further evaluation of iron deficiency anemia. ? ?Severe hypokalemia: Replete potassium and monitor levels.  Patient has a history of hypokalemia and may need potassium supplement at discharge. ? ?History of stroke with right-sided weakness, bedbound status: Patient said he has been bedbound since March 2022.  Aspirin on hold follow-up because of severe anemia. ? ?Other comorbidities include type II DM, hypertension ? ? ?Diet Order   ? ?       ?  Diet Carb Modified Fluid consistency: Thin; Room service appropriate? Yes  Diet effective now       ?  ? ?  ?  ? ?  ? ? ? ? ? ? ? ? ? ? ? ?Consultants: ?None ? ?Procedures: ?None ? ? ? ?Medications:  ? ? allopurinol  300 mg Oral Daily  ? amLODipine  10 mg Oral Daily  ? atorvastatin  80 mg Oral Daily  ? cholecalciferol  1,000 Units Oral Daily  ? finasteride  5 mg Oral Daily  ? insulin aspart  0-15 Units Subcutaneous TID WC  ? losartan  100 mg Oral Daily  ? polyethylene glycol   17 g Oral Daily  ? vitamin B-12  1,000 mcg Oral Daily  ? ?Continuous Infusions: ? sodium chloride    ? iron sucrose    ? potassium chloride 10 mEq (02/08/22 0945)  ? ? ? ?Anti-infectives (From admission, onward)  ? ? None  ? ?  ? ? ? ? ? ? ? ? ? ?Family Communication/Anticipated D/C date and plan/Code Status  ? ?DVT prophylaxis: SCDs Start: 02/07/22 1645 ? ? ?  Code Status: Full Code ? ?Family Communication: Plan discussed with his wife at beddide ?Disposition Plan: Plan to discharge home tomorrow ? ? ?Status is: Observation ?The patient will require care spanning > 2 midnights and should be moved to inpatient because: Replete potassium, monitor H&H ? ? ? ? ? ? ?Subjective:  ? ?Interval events noted.  He complains of generalized weakness.  His wife is at the bedside. ? ?Objective:  ? ? ?Vitals:  ? 02/07/22 2030 02/07/22 2300 02/08/22 0402 02/08/22 0807  ?BP: 129/65  128/67 114/65  ?Pulse: 82  88 88  ?Resp:   20 18  ?Temp: 98.8 ?F (37.1 ?C)  97.8 ?F (36.6 ?C) 97.8 ?F (36.6 ?C)  ?TempSrc:      ?SpO2: 96%  96% 98%  ?Weight:  124.2 kg    ?Height:  '5\' 10"'$  (1.778 m)    ? ?  No data found. ? ? ?Intake/Output Summary (Last 24 hours) at 02/08/2022 1023 ?Last data filed at 02/08/2022 1021 ?Gross per 24 hour  ?Intake 97.48 ml  ?Output 800 ml  ?Net -702.52 ml  ? ?Filed Weights  ? 02/07/22 2300  ?Weight: 124.2 kg  ? ? ?Exam: ? ?GEN: NAD ?SKIN: Warm and dry ?EYES: No pallor or icterus ?ENT: MMM ?CV: RRR ?PULM: CTA B ?ABD: soft, ND, NT, +BS ?CNS: AAO x 3,right hemiparesis (RLE 2/5, RLE 1/5) ?EXT: No edema or tenderness ? ?  ? ? ?Data Reviewed:  ? ?I have personally reviewed following labs and imaging studies: ? ?Labs: ?Labs show the following:  ? ?Basic Metabolic Panel: ?Recent Labs  ?Lab 02/07/22 ?1140 02/07/22 ?1621 02/08/22 ?0500  ?NA 139  --  143  ?K 2.4*  --  2.4*  ?CL 100  --  105  ?CO2 26  --  26  ?GLUCOSE 221*  --  169*  ?BUN 12  --  13  ?CREATININE 0.91  --  0.80  ?CALCIUM 8.9  --  8.7*  ?MG  --  1.7 1.7  ? ?GFR ?Estimated  Creatinine Clearance: 116.9 mL/min (by C-G formula based on SCr of 0.8 mg/dL). ?Liver Function Tests: ?Recent Labs  ?Lab 02/07/22 ?1140  ?AST 21  ?ALT 11  ?ALKPHOS 75  ?BILITOT 0.7  ?PROT 7.5  ?ALBUMIN 3.4*  ? ?No results for input(s): LIPASE, AMYLASE in the last 168 hours. ?No results for input(s): AMMONIA in the last 168 hours. ?Coagulation profile ?Recent Labs  ?Lab 02/07/22 ?1621  ?INR 1.1  ? ? ?CBC: ?Recent Labs  ?Lab 02/07/22 ?1140 02/07/22 ?1621 02/08/22 ?0500  ?WBC 7.5 7.6 9.7  ?HGB 7.0* 6.7* 7.8*  ?HCT 27.6* 26.5* 28.7*  ?MCV 66.2* 67.1* 66.3*  ?PLT 540* 499* 456*  ? ?Cardiac Enzymes: ?No results for input(s): CKTOTAL, CKMB, CKMBINDEX, TROPONINI in the last 168 hours. ?BNP (last 3 results) ?No results for input(s): PROBNP in the last 8760 hours. ?CBG: ?Recent Labs  ?Lab 02/07/22 ?1817 02/07/22 ?2333 02/08/22 ?0086  ?GLUCAP 155* 175* 170*  ? ?D-Dimer: ?No results for input(s): DDIMER in the last 72 hours. ?Hgb A1c: ?Recent Labs  ?  02/08/22 ?0500  ?HGBA1C 7.3*  ? ?Lipid Profile: ?No results for input(s): CHOL, HDL, LDLCALC, TRIG, CHOLHDL, LDLDIRECT in the last 72 hours. ?Thyroid function studies: ?No results for input(s): TSH, T4TOTAL, T3FREE, THYROIDAB in the last 72 hours. ? ?Invalid input(s): FREET3 ?Anemia work up: ?Recent Labs  ?  02/07/22 ?1140  ?FERRITIN 16*  ?TIBC 368  ?IRON 10*  ?RETICCTPCT 2.5  ? ?Sepsis Labs: ?Recent Labs  ?Lab 02/07/22 ?1140 02/07/22 ?1621 02/08/22 ?0500  ?WBC 7.5 7.6 9.7  ? ? ?Microbiology ?No results found for this or any previous visit (from the past 240 hour(s)). ? ?Procedures and diagnostic studies: ? ?No results found. ? ? ? ? ? ? ? ? ? ? ? ? LOS: 0 days  ? ?Tamila Gaulin  ?[facility]  ? ?Pager on [email or web address]. If 7PM-7AM, please contact night-coverage at [email or web address] ? ? ? ? ?02/08/2022, 10:23 AM  ? ? ? ? ? ? ? ? ? ?

## 2022-02-08 NOTE — Plan of Care (Signed)

## 2022-02-08 NOTE — Progress Notes (Signed)
[facility]) Hospital Liaison note: ? ?This patient is currently enrolled in [facility] outpatient-based Palliative Care. Will continue to follow for disposition. ? ?Please call with any outpatient palliative questions or concerns. ? ?Thank you, ?Lorelee Market, LPN ?[facility] Hospital Liaison ?[phone] ?

## 2022-02-08 NOTE — Progress Notes (Signed)
Lab called informing of critical value of Potassium of 2.6. NP Made aware. New orders and carried out ?

## 2022-02-08 NOTE — Plan of Care (Signed)
Patient is compliant with treatment plan. ?

## 2022-02-09 DIAGNOSIS — D649 Anemia, unspecified: Secondary | ICD-10-CM | POA: Diagnosis not present

## 2022-02-09 DIAGNOSIS — E876 Hypokalemia: Secondary | ICD-10-CM | POA: Diagnosis not present

## 2022-02-09 DIAGNOSIS — I6930 Unspecified sequelae of cerebral infarction: Secondary | ICD-10-CM | POA: Diagnosis not present

## 2022-02-09 LAB — CBC
HCT: 27.2 % — ABNORMAL LOW (ref 39.0–52.0)
Hemoglobin: 7.3 g/dL — ABNORMAL LOW (ref 13.0–17.0)
MCH: 18 pg — ABNORMAL LOW (ref 26.0–34.0)
MCHC: 26.8 g/dL — ABNORMAL LOW (ref 30.0–36.0)
MCV: 67.2 fL — ABNORMAL LOW (ref 80.0–100.0)
Platelets: 475 10*3/uL — ABNORMAL HIGH (ref 150–400)
RBC: 4.05 MIL/uL — ABNORMAL LOW (ref 4.22–5.81)
RDW: 22.2 % — ABNORMAL HIGH (ref 11.5–15.5)
WBC: 8 10*3/uL (ref 4.0–10.5)
nRBC: 0 % (ref 0.0–0.2)

## 2022-02-09 LAB — BASIC METABOLIC PANEL
Anion gap: 7 (ref 5–15)
BUN: 10 mg/dL (ref 8–23)
CO2: 23 mmol/L (ref 22–32)
Calcium: 8.1 mg/dL — ABNORMAL LOW (ref 8.9–10.3)
Chloride: 111 mmol/L (ref 98–111)
Creatinine, Ser: 0.72 mg/dL (ref 0.61–1.24)
GFR, Estimated: >60 mL/min (ref >60)
Glucose, Bld: 156 mg/dL — ABNORMAL HIGH (ref 70–99)
Potassium: 2.9 mmol/L — ABNORMAL LOW (ref 3.5–5.1)
Sodium: 141 mmol/L (ref 135–145)

## 2022-02-09 LAB — MAGNESIUM: Magnesium: 1.9 mg/dL (ref 1.7–2.4)

## 2022-02-09 LAB — GLUCOSE, CAPILLARY
Glucose-Capillary: 177 mg/dL — ABNORMAL HIGH (ref 70–99)
Glucose-Capillary: 181 mg/dL — ABNORMAL HIGH (ref 70–99)

## 2022-02-09 LAB — POTASSIUM: Potassium: 3.1 mmol/L — ABNORMAL LOW (ref 3.5–5.1)

## 2022-02-09 MED ORDER — POTASSIUM CHLORIDE CRYS ER 20 MEQ PO TBCR: 20.0000 meq | EXTENDED_RELEASE_TABLET | Freq: Every day | ORAL | 0 refills | Status: DC

## 2022-02-09 MED ORDER — FERROUS SULFATE 325 (65 FE) MG PO TABS: 325.0000 mg | ORAL_TABLET | ORAL | 0 refills | Status: DC

## 2022-02-09 MED ORDER — POLYETHYLENE GLYCOL 3350 17 G PO PACK: 17.0000 g | PACK | Freq: Every day | ORAL | 0 refills | Status: DC | PRN

## 2022-02-09 MED ORDER — POTASSIUM CHLORIDE CRYS ER 20 MEQ PO TBCR
40.0000 meq | EXTENDED_RELEASE_TABLET | ORAL | Status: AC
  Administered 2022-02-09 (×2): 40 meq via ORAL
  Filled 2022-02-09 (×2): qty 2

## 2022-02-09 NOTE — Progress Notes (Signed)
Avs reviewed with pt and wife. IV removed. All questions and concerns answered. Brief changed pt dressed awaiting transport for home ? ?

## 2022-02-09 NOTE — Progress Notes (Incomplete)
{  Select_TRH_Note:[id number]} 

## 2022-02-09 NOTE — TOC CM/SW Note (Addendum)
Notified by RN that patient may DC home today. ?Per rounds patient is bedbound at baseline. ?EMS paperwork completed and printed to unit. ?[facility] called at 4:12pm. Patient is next on the list. RN notified.  ? Oleh Genin, LCSW ?[phone] ? ?

## 2022-02-10 NOTE — Discharge Summary (Signed)
?Physician Discharge Summary ?  ?Patient: Peter Ryan MRN: [id number] DOB: 1951/05/04  ?Admit date:     02/07/2022  ?Discharge date: 02/09/2022  ?Discharge Physician: Jennye Boroughs  ? ?PCP: [name]., MD  ? ?Recommendations at discharge:  ? ?Follow-up with PCP in 1 week ? ?Recommended outpatient follow-up with gastroenterologist for colonoscopy ? ?Discharge Diagnoses: ?Principal Problem: ?  Symptomatic anemia ?Active Problems: ?  Hypokalemia ?  Type 2 diabetes mellitus ([place]) ?  History of stroke with residual deficit ?  Benign essential hypertension ?  Morbid obesity ([place]) ? ?Resolved Problems: ?  * No resolved hospital problems. * ? ?Hospital Course: ?  ?Peter Ryan is a 71 y.o. male  with medical history significant for CVA with right-sided hemiparesis, hypertension, diabetes mellitus, bedbound status, history of non-Hodgkin's lymphoma who was sent to the ER for evaluation of symptomatic anemia.  He complained of generalized weakness. ?  ?He was found to have severe anemia with hemoglobin of 6.7.  He also has iron deficiency anemia.  He was transfused with 1 units of packed red blood cells and was given 2 doses of IV iron sucrose infusion.  H&H remained stable.  He did not report any GI bleeding.  However, given iron deficiency anemia, he was advised to follow-up with gastroenterologist for colonoscopy.  He also had hypokalemia that was repleted.  He is deemed stable for discharge.  Outpatient follow-up with PCP was recommended to monitor CBC and BMP.  Discharge plan was discussed with the patient and his wife at the bedside. ? ? ? ? ? ? ?  ? ?Consultants: None ?Procedures performed: None  ?Disposition: Home ?Diet recommendation:  ?Discharge Diet Orders (From admission, onward)  ? ?  Start     Ordered  ? 02/09/22 0000  Diet - low sodium heart healthy       ? 02/09/22 1610  ? ?  ?  ? ?  ? ?Cardiac diet ?DISCHARGE MEDICATION: ?Allergies as of 02/09/2022   ? ?   Reactions  ? Hctz  [hydrochlorothiazide] Other (See Comments)  ? Gout flare  ? Metformin Other (See Comments)  ? Stomach upset  ? ?  ? ?  ?Medication List  ?  ? ?STOP taking these medications   ? ?dapagliflozin propanediol 10 MG Tabs tablet ?Commonly known as: [place] ?  ?ketoconazole 2 % shampoo ?Commonly known as: NIZORAL ?  ? ?  ? ?TAKE these medications   ? ?acetaminophen 325 MG tablet ?Commonly known as: TYLENOL ?Take 2 tablets (650 mg total) by mouth every 4 (four) hours as needed for mild pain (or temp > 37.5 C (99.5 F)). ?Notes to patient: Not given in hospital ?  ?allopurinol 300 MG tablet ?Commonly known as: ZYLOPRIM ?TAKE ONE TABLET EVERY DAY ?  ?amLODipine 10 MG tablet ?Commonly known as: NORVASC ?TAKE ONE TABLET BY MOUTH EVERY DAY ?  ?aspirin 325 MG EC tablet ?Take 1 tablet (325 mg total) by mouth daily. ?Notes to patient: Not given in hospital ?  ?atorvastatin 80 MG tablet ?Commonly known as: LIPITOR ?TAKE ONE TABLET (80 MG) BY MOUTH EVERY DAY ?  ?Boudreauxs Butt Paste 40 % ointment ?Generic drug: liver oil-zinc oxide ?Apply 1 application. topically 2 (two) times daily. ?Notes to patient: Not given in hospital ?  ?cloNIDine 0.1 MG tablet ?Commonly known as: CATAPRES ?TAKE ONE (1) TABLET BY MOUTH TWO TIMES PER DAY ?What changed: See the new instructions. ?Notes to patient: Not given in hospital ?  ?ferrous sulfate  325 (65 FE) MG tablet ?Take 1 tablet (325 mg total) by mouth every other day. ?  ?finasteride 5 MG tablet ?Commonly known as: PROSCAR ?Take 1 tablet (5 mg total) by mouth daily. ?  ?hydrocortisone 2.5 % lotion ?Apply topically as directed. Apply to affected areas as needed up to three times weekly. ?  ?losartan 100 MG tablet ?Commonly known as: COZAAR ?TAKE ONE TABLET EVERY DAY BY MOUTH ?What changed: See the new instructions. ?  ?polyethylene glycol 17 g packet ?Commonly known as: MIRALAX / GLYCOLAX ?Take 17 g by mouth daily as needed. ?What changed:  ?when to take this ?reasons to take this ?  ?potassium  chloride SA 20 MEQ tablet ?Commonly known as: KLOR-CON M ?Take 1 tablet (20 mEq total) by mouth daily. ?  ?vitamin B-12 1000 MCG tablet ?Commonly known as: CYANOCOBALAMIN ?Take 1 tablet (1,000 mcg total) by mouth daily. ?  ?Vitamin D3 25 MCG (1000 UT) Caps ?Take 1 capsule (1,000 Units total) by mouth daily. ?  ? ?  ? ? ?Discharge Exam: ?Danley Danker Weights  ? 02/07/22 2300  ?Weight: 124.2 kg  ? ?GEN: NAD ?SKIN: Warm and dry ?EYES: No pallor or icterus ?ENT: MMM ?CV: RRR ?PULM: CTA B ?ABD: soft, ND, NT, +BS ?CNS: AAO x 3, right hemiparesis ?EXT: No edema or tenderness ? ? ?Condition at discharge: good ? ?The results of significant diagnostics from this hospitalization (including imaging, microbiology, ancillary and laboratory) are listed below for reference.  ? ?Imaging Studies: ?No results found. ? ?Microbiology: ?Results for orders placed or performed during the hospital encounter of 11/30/20  ?Resp Panel by RT-PCR (Flu A&B, Covid) Nasopharyngeal Swab     Status: None  ? Collection Time: 11/30/20  3:06 PM  ? Specimen: Nasopharyngeal Swab; Nasopharyngeal(NP) swabs in vial transport medium  ?Result Value Ref Range Status  ? SARS Coronavirus 2 by RT PCR NEGATIVE NEGATIVE Final  ?  Comment: (NOTE) ?SARS-CoV-2 target nucleic acids are NOT DETECTED. ? ?The SARS-CoV-2 RNA is generally detectable in upper respiratory ?specimens during the acute phase of infection. The lowest ?concentration of SARS-CoV-2 viral copies this assay can detect is ?138 copies/mL. A negative result does not preclude SARS-Cov-2 ?infection and should not be used as the sole basis for treatment or ?other patient management decisions. A negative result may occur with  ?improper specimen collection/handling, submission of specimen other ?than nasopharyngeal swab, presence of viral mutation(s) within the ?areas targeted by this assay, and inadequate number of viral ?copies(<138 copies/mL). A negative result must be combined with ?clinical observations,  patient history, and epidemiological ?information. The expected result is Negative. ? ?Fact Sheet for Patients:  ?EntrepreneurPulse.com.au ? ?Fact Sheet for Healthcare Providers:  ?IncredibleEmployment.be ? ?This test is no t yet approved or cleared by the [place] FDA and  ?has been authorized for detection and/or diagnosis of SARS-CoV-2 by ?FDA under an Emergency Use Authorization (EUA). This EUA will remain  ?in effect (meaning this test can be used) for the duration of the ?COVID-19 declaration under Section 564(b)(1) of the Act, 21 ?U.S.C.section 360bbb-3(b)(1), unless the authorization is terminated  ?or revoked sooner.  ? ? ?  ? Influenza A by PCR NEGATIVE NEGATIVE Final  ? Influenza B by PCR NEGATIVE NEGATIVE Final  ?  Comment: (NOTE) ?The Xpert Xpress SARS-CoV-2/FLU/RSV plus assay is intended as an aid ?in the diagnosis of influenza from Nasopharyngeal swab specimens and ?should not be used as a sole basis for treatment. Nasal washings and ?aspirates are unacceptable for  Xpert Xpress SARS-CoV-2/FLU/RSV ?testing. ? ?Fact Sheet for Patients: ?EntrepreneurPulse.com.au ? ?Fact Sheet for Healthcare Providers: ?IncredibleEmployment.be ? ?This test is not yet approved or cleared by the [place] FDA and ?has been authorized for detection and/or diagnosis of SARS-CoV-2 by ?FDA under an Emergency Use Authorization (EUA). This EUA will remain ?in effect (meaning this test can be used) for the duration of the ?COVID-19 declaration under Section 564(b)(1) of the Act, 21 U.S.C. ?section 360bbb-3(b)(1), unless the authorization is terminated or ?revoked. ? ?Performed at [facility], [place], ?[place] [address] ?  ? ? ?Labs: ?CBC: ?Recent Labs  ?Lab 02/07/22 ?1140 02/07/22 ?1621 02/08/22 ?0500 02/09/22 ?0436  ?WBC 7.5 7.6 9.7 8.0  ?HGB 7.0* 6.7* 7.8* 7.3*  ?HCT 27.6* 26.5* 28.7* 27.2*  ?MCV 66.2* 67.1* 66.3* 67.2*   ?PLT 540* 499* 456* 475*  ? ?Basic Metabolic Panel: ?Recent Labs  ?Lab 02/07/22 ?1140 02/07/22 ?1621 02/08/22 ?0500 02/08/22 ?1827 02/09/22 ?0436 02/09/22 ?1521  ?NA 139  --  143  --  141  --   ?K 2.4*  --  2.4* 2.6* 2.9* 3.1*  ?

## 2022-02-14 DIAGNOSIS — E119 Type 2 diabetes mellitus without complications: Secondary | ICD-10-CM | POA: Diagnosis not present

## 2022-02-14 DIAGNOSIS — M1A9XX0 Chronic gout, unspecified, without tophus (tophi): Secondary | ICD-10-CM | POA: Diagnosis not present

## 2022-02-14 DIAGNOSIS — K5909 Other constipation: Secondary | ICD-10-CM | POA: Diagnosis not present

## 2022-02-14 DIAGNOSIS — I69351 Hemiplegia and hemiparesis following cerebral infarction affecting right dominant side: Secondary | ICD-10-CM | POA: Diagnosis not present

## 2022-02-14 DIAGNOSIS — I10 Essential (primary) hypertension: Secondary | ICD-10-CM | POA: Diagnosis not present

## 2022-02-14 DIAGNOSIS — R54 Age-related physical debility: Secondary | ICD-10-CM | POA: Diagnosis not present

## 2022-02-14 DIAGNOSIS — D649 Anemia, unspecified: Secondary | ICD-10-CM | POA: Diagnosis not present

## 2022-02-14 DIAGNOSIS — Z7401 Bed confinement status: Secondary | ICD-10-CM | POA: Diagnosis not present

## 2022-02-14 DIAGNOSIS — Z7982 Long term (current) use of aspirin: Secondary | ICD-10-CM | POA: Diagnosis not present

## 2022-02-21 DIAGNOSIS — D649 Anemia, unspecified: Secondary | ICD-10-CM | POA: Diagnosis not present

## 2022-02-21 DIAGNOSIS — E119 Type 2 diabetes mellitus without complications: Secondary | ICD-10-CM | POA: Diagnosis not present

## 2022-02-21 DIAGNOSIS — Z8673 Personal history of transient ischemic attack (TIA), and cerebral infarction without residual deficits: Secondary | ICD-10-CM | POA: Diagnosis not present

## 2022-03-04 ENCOUNTER — Other Ambulatory Visit: Payer: Medicare Other | Admitting: Student

## 2022-03-04 DIAGNOSIS — I69321 Dysphasia following cerebral infarction: Secondary | ICD-10-CM | POA: Diagnosis not present

## 2022-03-04 DIAGNOSIS — I69359 Hemiplegia and hemiparesis following cerebral infarction affecting unspecified side: Secondary | ICD-10-CM

## 2022-03-04 DIAGNOSIS — D509 Iron deficiency anemia, unspecified: Secondary | ICD-10-CM | POA: Diagnosis not present

## 2022-03-04 DIAGNOSIS — K5900 Constipation, unspecified: Secondary | ICD-10-CM

## 2022-03-04 DIAGNOSIS — R110 Nausea: Secondary | ICD-10-CM

## 2022-03-04 DIAGNOSIS — I6930 Unspecified sequelae of cerebral infarction: Secondary | ICD-10-CM

## 2022-03-04 DIAGNOSIS — Z515 Encounter for palliative care: Secondary | ICD-10-CM | POA: Diagnosis not present

## 2022-03-04 NOTE — Progress Notes (Signed)
[facility] Consult Note Telephone: [phone]  Fax: [phone]    Date of encounter: 03/04/22 2:07 PM PATIENT NAME: Peter Ryan [address] [place] [address]   [phone] (home)  DOB: 07/04/1951 MRN: [id number] PRIMARY CARE PROVIDER:    [facility]  REFERRING PROVIDER:   [name]., MD [address],  [place] [address] [phone]  RESPONSIBLE PARTY:    Contact Information     Name Relation Home Work Mobile   Lonnel, Gjerde [phone]          I met face to face with patient and family in the home. Palliative Care was asked to follow this patient by consultation request of  [name].,* to address advance care planning and complex medical decision making. This is a follow up visit.                                   ASSESSMENT AND PLAN / RECOMMENDATIONS:   Advance Care Planning/Goals of Care: Goals include to maximize quality of life and symptom management. Patient/health care surrogate gave his/her permission to discuss. Our advance care planning conversation included a discussion about:    The value and importance of advance care planning  Experiences with loved ones who have been seriously ill or have died  Exploration of personal, cultural or spiritual beliefs that might influence medical decisions  Exploration of goals of care in the event of a sudden injury or illness  CODE STATUS: Full Code  Education provided on Palliative Medicine. Patient wants to limit hospitalizations. He is open to having blood transfusions if needed.   Symptom Management/Plan:  CVA/right sided hemiparesis-Patient is requiring more assistance with adl's. Mechanical lift is used for transfers. Encourage exercises to help maintain strength, functioning. Patient is sleeping in recliner, difficulty providing incontinent care and using urinal. [facility] bed;  patient wants to think about it further before ordering. Has a caregiver come in once a day to assist with care.   Dysphagia-occasional episodes; denies any worsening. Education on aspiration precautions.    Nausea-has improved; continue motion sickness patch.   Anemia-patient s/p hospitalization with blood transfusion d/t hemoglobin of 6.7 and also receiving iron infusion. He was started on ferrous sulfate; he recently discontinued d/t constipation. Recent lab work per [employer]; results unavailable. Education provided; he is encouraged to take ferrous sulfate; recommend miralax for constipation; he takes PRN and also has colace for constipation.   Follow up Palliative Care Visit: Palliative care will continue to follow for complex medical decision making, advance care planning, and clarification of goals. Return in 8 weeks or prn.   This visit was coded based on medical decision making (MDM).  PPS: 30%  HOSPICE ELIGIBILITY/DIAGNOSIS: TBD  Chief Complaint: Palliative Medicine follow up visit.   HISTORY OF PRESENT ILLNESS:  Peter Ryan is a 71 y.o. 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 hospitalized 5/11-5/13/23 due to symptomatic anemia.   Reports having more energy, not as fatigued since hospitalization. Stopped iron about a week ago due to constipation. Started [facility] since discharge from hospital. [facility] visited on 02/21/22. Has a caregiver once a day, in the mornings to assist with adl's. Has trouble with spilling urinal. He is sleeping in recliner  and care being provided in recliner, which has been difficult. Appetite is fair; still has trouble with dysphagia, but denies any worsening. Nausea has improved; reports not getting sick as often.  A 10-point ROS is negative, except for the pertinent positives and negatives  detailed per the HPI.  History obtained from review of EMR, discussion with primary team, and interview with family, facility staff/caregiver and/or Mr. Dowding.  I reviewed available labs, medications, imaging, studies and related documents from the EMR.  Records reviewed and summarized above.    Physical Exam:  Pulse 78, resp 16, b/p 120/72, sats 97% on room air. Constitutional: NAD General: frail appearing EYES: anicteric sclera, lids intact, no discharge  ENMT: intact hearing, oral mucous membranes moist, dentition intact CV: S1S2, RRR, no LE edema Pulmonary: LCTA, no increased work of breathing, no cough, room air Abdomen: normo-active BS + 4 quadrants, soft and non tender, no ascites GU: deferred MSK: right sided hemiparesis; non-ambulatory Skin: warm and dry, no rashes or wounds on visible skin Neuro: no generalized weakness,  no cognitive impairment Psych: non-anxious affect, A and O x 3 Hem/lymph/immuno: no widespread bruising   Thank you for the opportunity to participate in the care of Mr. Rho.  The palliative care team will continue to follow. Please call our office at [phone] if we can be of additional assistance.   Ezekiel Slocumb, NP   COVID-19 PATIENT SCREENING TOOL Asked and negative response unless otherwise noted:   Have you had symptoms of covid, tested positive or been in contact with someone with symptoms/positive test in the past 5-10 days? No

## 2022-03-05 ENCOUNTER — Telehealth: Payer: Self-pay

## 2022-03-05 NOTE — Telephone Encounter (Signed)
1012 am.  Request received from [facility], NP to follow up with wife regarding the cost of a hospital bed.  Phone call made to [facility] with [employer].  Hospital beds range from $1200-$1500 depending on choice of semi-electric or fully electric.  Estimated monthly cost is $120-$150 a month.  Phone call made to wife Peter Ryan to provide information.  Message left on VM with above.

## 2022-03-26 DIAGNOSIS — I10 Essential (primary) hypertension: Secondary | ICD-10-CM | POA: Diagnosis not present

## 2022-03-26 DIAGNOSIS — D649 Anemia, unspecified: Secondary | ICD-10-CM | POA: Diagnosis not present

## 2022-03-26 DIAGNOSIS — R54 Age-related physical debility: Secondary | ICD-10-CM | POA: Diagnosis not present

## 2022-03-26 DIAGNOSIS — Z7401 Bed confinement status: Secondary | ICD-10-CM | POA: Diagnosis not present

## 2022-04-22 DIAGNOSIS — E876 Hypokalemia: Secondary | ICD-10-CM | POA: Diagnosis not present

## 2022-04-22 DIAGNOSIS — Z8673 Personal history of transient ischemic attack (TIA), and cerebral infarction without residual deficits: Secondary | ICD-10-CM | POA: Diagnosis not present

## 2022-04-22 DIAGNOSIS — Z91128 Patient's intentional underdosing of medication regimen for other reason: Secondary | ICD-10-CM | POA: Diagnosis not present

## 2022-04-22 DIAGNOSIS — R54 Age-related physical debility: Secondary | ICD-10-CM | POA: Diagnosis not present

## 2022-04-22 DIAGNOSIS — D649 Anemia, unspecified: Secondary | ICD-10-CM | POA: Diagnosis not present

## 2022-04-24 DIAGNOSIS — E119 Type 2 diabetes mellitus without complications: Secondary | ICD-10-CM | POA: Diagnosis not present

## 2022-04-24 DIAGNOSIS — R54 Age-related physical debility: Secondary | ICD-10-CM | POA: Diagnosis not present

## 2022-04-24 DIAGNOSIS — I10 Essential (primary) hypertension: Secondary | ICD-10-CM | POA: Diagnosis not present

## 2022-04-29 DIAGNOSIS — E876 Hypokalemia: Secondary | ICD-10-CM | POA: Diagnosis not present

## 2022-04-29 DIAGNOSIS — K5909 Other constipation: Secondary | ICD-10-CM | POA: Diagnosis not present

## 2022-04-29 DIAGNOSIS — I10 Essential (primary) hypertension: Secondary | ICD-10-CM | POA: Diagnosis not present

## 2022-04-29 DIAGNOSIS — E1165 Type 2 diabetes mellitus with hyperglycemia: Secondary | ICD-10-CM | POA: Diagnosis not present

## 2022-04-29 DIAGNOSIS — D649 Anemia, unspecified: Secondary | ICD-10-CM | POA: Diagnosis not present

## 2022-05-06 ENCOUNTER — Other Ambulatory Visit: Payer: Medicare Other | Admitting: Student

## 2022-05-06 DIAGNOSIS — D509 Iron deficiency anemia, unspecified: Secondary | ICD-10-CM | POA: Diagnosis not present

## 2022-05-06 DIAGNOSIS — I6930 Unspecified sequelae of cerebral infarction: Secondary | ICD-10-CM

## 2022-05-06 DIAGNOSIS — K5900 Constipation, unspecified: Secondary | ICD-10-CM | POA: Diagnosis not present

## 2022-05-06 DIAGNOSIS — Z515 Encounter for palliative care: Secondary | ICD-10-CM | POA: Diagnosis not present

## 2022-05-06 DIAGNOSIS — I69359 Hemiplegia and hemiparesis following cerebral infarction affecting unspecified side: Secondary | ICD-10-CM | POA: Diagnosis not present

## 2022-05-06 DIAGNOSIS — I69321 Dysphasia following cerebral infarction: Secondary | ICD-10-CM

## 2022-05-06 NOTE — Progress Notes (Unsigned)
[facility] Consult Note Telephone: [phone]  Fax: [phone]    Date of encounter: 05/06/22 2:15 PM PATIENT NAME: Peter Ryan [address] [place] [address]   [phone] (home)  DOB: 09-26-1951 MRN: [id number] PRIMARY CARE PROVIDER:    [name]., MD,  [address] [place] [address] [phone]  REFERRING PROVIDER:   [facility]  RESPONSIBLE PARTY:    Contact Information     Name Relation Home Work Mobile   Willcox,Sherry L Spouse [phone]          I met face to face with patient and family in the home. Palliative Care was asked to follow this patient by consultation request of [facility] to address advance care planning and complex medical decision making. This is a follow up visit.                                   ASSESSMENT AND PLAN / RECOMMENDATIONS:   Advance Care Planning/Goals of Care: Goals include to maximize quality of life and symptom management. Patient/health care surrogate gave his/her permission to discuss. Our advance care planning conversation included a discussion about:    The value and importance of advance care planning  Experiences with loved ones who have been seriously ill or have died  Exploration of personal, cultural or spiritual beliefs that might influence medical decisions  Exploration of goals of care in the event of a sudden injury or illness  CODE STATUS: Full Code  Palliative Medicine will continue to provide supportive care, symptom management as needed. Patient is still open to hospitalization as needed, transfusions as needed. Wife did inquire about Medicaid in facility; would like to keep patient at home, but exploring options in the future. Will refer to palliative SW. Refill sent for patient's amlodipine.  Symptom Management/Plan:  CVA, right sided hemiparesis-increased weakness reported. Taking two persons to  transfer with sit to stand lift. Patient declines therapy; he is encouraged to perform ROM exercises previously provided by therapy. He is to have a hoyer lift delivered soon. Continue family support and caregivers daily to assist with transfers and bathing once a week.  Dysphagia- continued education on aspiration precautions. Encourage patient to turn television off when eating as he tends to eat fast, distracted.   Anemia-hemoglobin 7.3 on 04/29/22. Patient had stopped taking iron. He has restarted 3 times a week. Discussed taking with orange juice for absorption. Also reviewed iron rich foods. Wife encouraged to check blood sugar daily.   Constipation-continue miralax and colace; adequate fluids and fiber encouraged.  Follow up Palliative Care Visit: Palliative care will continue to follow for complex medical decision making, advance care planning, and clarification of goals. Return in 8 weeks or prn.  This visit was coded based on medical decision making (MDM).  PPS: 30%  HOSPICE ELIGIBILITY/DIAGNOSIS: TBD  Chief Complaint: Palliative Medicine follow up visit.   HISTORY OF PRESENT ILLNESS:  [name] is a 71 y.o. 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 Hospitalized 5/11-5/13/23 due to symptomatic anemia.     Patient endorses increased weakness, fatigue; takes 2 people to assist with transfers. Currently using sit to stand lift; awaiting delivery of hoyer lift. Patient able to feed self, brush his teeth. Wife expresses patient needing  more help than he is claiming. No falls. Denies pain, shortness of breath. Good appetite endorsed; taking pills whole in applesauce. No worsening dysphagia. Hemoglobin 7.3 on 04/29/22. Wife applying Terrasil to buttocks daily.   Patient received resting in recliner. He does repeat himself more today, engages  in conversation. Pale in color.  History obtained from review of EMR, discussion with primary team, and interview with family, facility staff/caregiver and/or Mr. Riner.  I reviewed available labs, medications, imaging, studies and related documents from the EMR.  Records reviewed and summarized above.   ROS  10-Point ROS is negative, except for pertinent positives/negatives detailed per the HPI.  Physical Exam: Pulse 84, resp 16, b/p 138/70, sats 95% on room air.  Constitutional: NAD General: frail appearing EYES: anicteric sclera, lids intact, no discharge  ENMT: intact hearing, oral mucous membranes moist, dentition intact CV: S1S2, RRR, no LE edema Pulmonary: LCTA, no increased work of breathing, no cough, room air Abdomen:  normo-active BS + 4 quadrants, soft and non tender, no ascites GU: deferred MSK: right sided hemiparesis, non- ambulatory Skin: warm and dry, no rashes; buttocks not visualized Neuro: + generalized weakness, A & O x 3 Psych: non-anxious affect Hem/lymph/immuno: no widespread bruising   Thank you for the opportunity to participate in the care of Mr. Bargar.  The palliative care team will continue to follow. Please call our office at [phone] if we can be of additional assistance.   Ezekiel Slocumb, NP   COVID-19 PATIENT SCREENING TOOL Asked and negative response unless otherwise noted:   Have you had symptoms of covid, tested positive or been in contact with someone with symptoms/positive test in the past 5-10 days? No

## 2022-05-20 ENCOUNTER — Other Ambulatory Visit: Payer: Self-pay | Admitting: Family Medicine

## 2022-05-20 DIAGNOSIS — I10 Essential (primary) hypertension: Secondary | ICD-10-CM

## 2022-05-23 DIAGNOSIS — I10 Essential (primary) hypertension: Secondary | ICD-10-CM | POA: Diagnosis not present

## 2022-05-23 DIAGNOSIS — R54 Age-related physical debility: Secondary | ICD-10-CM | POA: Diagnosis not present

## 2022-05-23 DIAGNOSIS — E119 Type 2 diabetes mellitus without complications: Secondary | ICD-10-CM | POA: Diagnosis not present

## 2022-05-30 DIAGNOSIS — Z91128 Patient's intentional underdosing of medication regimen for other reason: Secondary | ICD-10-CM | POA: Diagnosis not present

## 2022-05-30 DIAGNOSIS — D649 Anemia, unspecified: Secondary | ICD-10-CM | POA: Diagnosis not present

## 2022-06-06 ENCOUNTER — Other Ambulatory Visit: Payer: Self-pay | Admitting: Family Medicine

## 2022-06-27 DIAGNOSIS — Z4689 Encounter for fitting and adjustment of other specified devices: Secondary | ICD-10-CM | POA: Diagnosis not present

## 2022-06-27 DIAGNOSIS — Z7401 Bed confinement status: Secondary | ICD-10-CM | POA: Diagnosis not present

## 2022-06-27 DIAGNOSIS — I69351 Hemiplegia and hemiparesis following cerebral infarction affecting right dominant side: Secondary | ICD-10-CM | POA: Diagnosis not present

## 2022-06-27 DIAGNOSIS — R54 Age-related physical debility: Secondary | ICD-10-CM | POA: Diagnosis not present

## 2022-07-08 ENCOUNTER — Other Ambulatory Visit: Payer: Medicare Other | Admitting: Student

## 2022-07-08 DIAGNOSIS — D509 Iron deficiency anemia, unspecified: Secondary | ICD-10-CM | POA: Diagnosis not present

## 2022-07-08 DIAGNOSIS — R531 Weakness: Secondary | ICD-10-CM | POA: Diagnosis not present

## 2022-07-08 DIAGNOSIS — Z515 Encounter for palliative care: Secondary | ICD-10-CM

## 2022-07-08 DIAGNOSIS — I69321 Dysphasia following cerebral infarction: Secondary | ICD-10-CM | POA: Diagnosis not present

## 2022-07-08 DIAGNOSIS — I69359 Hemiplegia and hemiparesis following cerebral infarction affecting unspecified side: Secondary | ICD-10-CM | POA: Diagnosis not present

## 2022-07-08 NOTE — Progress Notes (Signed)
Therapist, nutritional Palliative Care Consult Note Telephone: [phone]  Fax: [phone]    Date of encounter: 07/08/22 1:07 PM PATIENT NAME: Peter Ryan [address] [place] [address]   [id number] (home)  DOB: [birth year] MRN: [id number] PRIMARY CARE PROVIDER:    [facility]  REFERRING PROVIDER:   Maple Ryan., MD No address on file N/A  RESPONSIBLE PARTY:    Contact Information     Name Relation Home Work Mobile   Peter Ryan Spouse [phone]          I met face to face with patient and family in the home. Palliative Care was asked to follow this patient by consultation request of  [facility] to address advance care planning and complex medical decision making. This is a follow up visit.                                   ASSESSMENT AND PLAN / RECOMMENDATIONS:   Advance Care Planning/Goals of Care: Goals include to maximize quality of life and symptom management. Patient/health care surrogate gave his/her permission to discuss. Our advance care planning conversation included a discussion about:    The value and importance of advance care planning  Experiences with loved ones who have been seriously ill or have died  Exploration of personal, cultural or spiritual beliefs that might influence medical decisions  Exploration of goals of care in the event of a sudden injury or illness  Review and updating or creation of an  advance directive document; DNR completed today; left in the home.  Decision not to resuscitate or to de-escalate disease focused treatments due to poor prognosis. CODE STATUS: DNR  Education provided on Palliative Medicine vs. Hospice services. Patient elects DNR; completed and left in the home. He also states that he wants to limit hospitalizations and not receive further blood transfusions.   Symptom Management/Plan:  CVA, right sided hemiparesis-patient endorses increased weakness,  leaning more to right side, poor trunk support, increased difficulty transferring. He is agreeable to PT evaluation. Recommend PT to evaluate and treat d/t increased weakness, leaning to right side, difficulty transferring. Patient does have sit to stand lift in the home. He does have dysphagia; ongoing education on precautions.   Anemia-last hemoglobin on file is 7.3. patient is taking iron. He does state that he would not want further blood transfusions. Denies increased fatigue, although he is sleeping more during the day per wife.    Follow up Palliative Care Visit: Palliative care will continue to follow for complex medical decision making, advance care planning, and clarification of goals. Return prn.   This visit was coded based on medical decision making (MDM).  PPS: 30%  HOSPICE ELIGIBILITY/DIAGNOSIS: TBD  Chief Complaint: Palliative Medicine follow up visit.   HISTORY OF PRESENT ILLNESS:  Peter Ryan is a 71 y.o. 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   Patient denies pain, shortness of breath, nausea, constipation. He is leaning more to the right side, poor trunk support. He is also having increased difficulty transferring; currently using sit to stand lift for toileting. Wife had purchased hoyer lift, but they had more difficulty using, as he is unable to assist in turning to place sling under him in recliner and there is not usually a second  person available. Appetite is good; still gets choked occasionally when eating or drinking too fast. No worsening dysphagia. He is sleeping more during the day. No falls; no recent ED visits or hospitalizations.   History obtained from review of EMR, discussion with primary team, and interview with family, facility staff/caregiver and/or Peter Ryan.  I reviewed available labs, medications,  imaging, studies and related documents from the EMR.  Records reviewed and summarized above.   ROS  A 10-Point ROS is negative, except for the pertinent positives and negatives detailed per the HPI.  Physical Exam: Pulse 86, resp 18, b/p 144/78, sats 96% on room air Constitutional: NAD General: frail appearing  EYES: anicteric sclera, lids intact, no discharge  ENMT: intact hearing, oral mucous membranes moist, dentition intact CV: S1S2, RRR, no LE edema Pulmonary: LCTA, no increased work of breathing, no cough, room air Abdomen: normo-active BS + 4 quadrants, soft and non tender GU: deferred MSK: right sided hemiparesis, non- ambulatory Skin: warm and dry, no rashes or wounds on visible skin Neuro:  +generalized weakness,  no cognitive impairment Psych: non-anxious affect, A and O x 3 Hem/lymph/immuno: no widespread bruising   Thank you for the opportunity to participate in the care of Peter Ryan.  The palliative care team will continue to follow. Please call our office at [phone] if we can be of additional assistance.   Peter Slocumb, NP   COVID-19 PATIENT SCREENING TOOL Asked and negative response unless otherwise noted:   Have you had symptoms of covid, tested positive or been in contact with someone with symptoms/positive test in the past 5-10 days? No

## 2022-07-12 DIAGNOSIS — K7581 Nonalcoholic steatohepatitis (NASH): Secondary | ICD-10-CM | POA: Diagnosis not present

## 2022-07-12 DIAGNOSIS — I69351 Hemiplegia and hemiparesis following cerebral infarction affecting right dominant side: Secondary | ICD-10-CM | POA: Diagnosis not present

## 2022-07-12 DIAGNOSIS — Z8572 Personal history of non-Hodgkin lymphomas: Secondary | ICD-10-CM | POA: Diagnosis not present

## 2022-07-12 DIAGNOSIS — I700 Atherosclerosis of aorta: Secondary | ICD-10-CM | POA: Diagnosis not present

## 2022-07-12 DIAGNOSIS — E785 Hyperlipidemia, unspecified: Secondary | ICD-10-CM | POA: Diagnosis not present

## 2022-07-12 DIAGNOSIS — Z7982 Long term (current) use of aspirin: Secondary | ICD-10-CM | POA: Diagnosis not present

## 2022-07-12 DIAGNOSIS — D649 Anemia, unspecified: Secondary | ICD-10-CM | POA: Diagnosis not present

## 2022-07-12 DIAGNOSIS — Z9181 History of falling: Secondary | ICD-10-CM | POA: Diagnosis not present

## 2022-07-12 DIAGNOSIS — E669 Obesity, unspecified: Secondary | ICD-10-CM | POA: Diagnosis not present

## 2022-07-12 DIAGNOSIS — I10 Essential (primary) hypertension: Secondary | ICD-10-CM | POA: Diagnosis not present

## 2022-07-12 DIAGNOSIS — E119 Type 2 diabetes mellitus without complications: Secondary | ICD-10-CM | POA: Diagnosis not present

## 2022-07-12 DIAGNOSIS — Z6828 Body mass index (BMI) 28.0-28.9, adult: Secondary | ICD-10-CM | POA: Diagnosis not present

## 2022-07-12 DIAGNOSIS — K7460 Unspecified cirrhosis of liver: Secondary | ICD-10-CM | POA: Diagnosis not present

## 2022-07-12 DIAGNOSIS — I69391 Dysphagia following cerebral infarction: Secondary | ICD-10-CM | POA: Diagnosis not present

## 2022-07-19 DIAGNOSIS — I700 Atherosclerosis of aorta: Secondary | ICD-10-CM | POA: Diagnosis not present

## 2022-07-19 DIAGNOSIS — I69351 Hemiplegia and hemiparesis following cerebral infarction affecting right dominant side: Secondary | ICD-10-CM | POA: Diagnosis not present

## 2022-07-19 DIAGNOSIS — I69391 Dysphagia following cerebral infarction: Secondary | ICD-10-CM | POA: Diagnosis not present

## 2022-07-19 DIAGNOSIS — D649 Anemia, unspecified: Secondary | ICD-10-CM | POA: Diagnosis not present

## 2022-07-19 DIAGNOSIS — I10 Essential (primary) hypertension: Secondary | ICD-10-CM | POA: Diagnosis not present

## 2022-07-19 DIAGNOSIS — E119 Type 2 diabetes mellitus without complications: Secondary | ICD-10-CM | POA: Diagnosis not present

## 2022-07-24 DIAGNOSIS — E119 Type 2 diabetes mellitus without complications: Secondary | ICD-10-CM | POA: Diagnosis not present

## 2022-07-24 DIAGNOSIS — I700 Atherosclerosis of aorta: Secondary | ICD-10-CM | POA: Diagnosis not present

## 2022-07-24 DIAGNOSIS — D649 Anemia, unspecified: Secondary | ICD-10-CM | POA: Diagnosis not present

## 2022-07-24 DIAGNOSIS — I69351 Hemiplegia and hemiparesis following cerebral infarction affecting right dominant side: Secondary | ICD-10-CM | POA: Diagnosis not present

## 2022-07-24 DIAGNOSIS — I69391 Dysphagia following cerebral infarction: Secondary | ICD-10-CM | POA: Diagnosis not present

## 2022-07-24 DIAGNOSIS — I10 Essential (primary) hypertension: Secondary | ICD-10-CM | POA: Diagnosis not present

## 2022-07-25 DIAGNOSIS — Z7401 Bed confinement status: Secondary | ICD-10-CM | POA: Diagnosis not present

## 2022-07-25 DIAGNOSIS — I69351 Hemiplegia and hemiparesis following cerebral infarction affecting right dominant side: Secondary | ICD-10-CM | POA: Diagnosis not present

## 2022-07-25 DIAGNOSIS — Z23 Encounter for immunization: Secondary | ICD-10-CM | POA: Diagnosis not present

## 2022-07-25 DIAGNOSIS — D649 Anemia, unspecified: Secondary | ICD-10-CM | POA: Diagnosis not present

## 2022-07-25 DIAGNOSIS — R54 Age-related physical debility: Secondary | ICD-10-CM | POA: Diagnosis not present

## 2022-07-30 DIAGNOSIS — I10 Essential (primary) hypertension: Secondary | ICD-10-CM | POA: Diagnosis not present

## 2022-07-30 DIAGNOSIS — I69351 Hemiplegia and hemiparesis following cerebral infarction affecting right dominant side: Secondary | ICD-10-CM | POA: Diagnosis not present

## 2022-07-30 DIAGNOSIS — E119 Type 2 diabetes mellitus without complications: Secondary | ICD-10-CM | POA: Diagnosis not present

## 2022-07-30 DIAGNOSIS — R54 Age-related physical debility: Secondary | ICD-10-CM | POA: Diagnosis not present

## 2022-07-31 DIAGNOSIS — I69391 Dysphagia following cerebral infarction: Secondary | ICD-10-CM | POA: Diagnosis not present

## 2022-07-31 DIAGNOSIS — E119 Type 2 diabetes mellitus without complications: Secondary | ICD-10-CM | POA: Diagnosis not present

## 2022-07-31 DIAGNOSIS — I69351 Hemiplegia and hemiparesis following cerebral infarction affecting right dominant side: Secondary | ICD-10-CM | POA: Diagnosis not present

## 2022-07-31 DIAGNOSIS — I10 Essential (primary) hypertension: Secondary | ICD-10-CM | POA: Diagnosis not present

## 2022-07-31 DIAGNOSIS — I700 Atherosclerosis of aorta: Secondary | ICD-10-CM | POA: Diagnosis not present

## 2022-07-31 DIAGNOSIS — D649 Anemia, unspecified: Secondary | ICD-10-CM | POA: Diagnosis not present

## 2022-08-05 DIAGNOSIS — E876 Hypokalemia: Secondary | ICD-10-CM | POA: Diagnosis not present

## 2022-08-05 DIAGNOSIS — Z7189 Other specified counseling: Secondary | ICD-10-CM | POA: Diagnosis not present

## 2022-08-05 DIAGNOSIS — R21 Rash and other nonspecific skin eruption: Secondary | ICD-10-CM | POA: Diagnosis not present

## 2022-08-05 DIAGNOSIS — I10 Essential (primary) hypertension: Secondary | ICD-10-CM | POA: Diagnosis not present

## 2022-08-05 DIAGNOSIS — E440 Moderate protein-calorie malnutrition: Secondary | ICD-10-CM | POA: Diagnosis not present

## 2022-08-05 DIAGNOSIS — D649 Anemia, unspecified: Secondary | ICD-10-CM | POA: Diagnosis not present

## 2022-08-07 DIAGNOSIS — I10 Essential (primary) hypertension: Secondary | ICD-10-CM | POA: Diagnosis not present

## 2022-08-07 DIAGNOSIS — I700 Atherosclerosis of aorta: Secondary | ICD-10-CM | POA: Diagnosis not present

## 2022-08-07 DIAGNOSIS — E119 Type 2 diabetes mellitus without complications: Secondary | ICD-10-CM | POA: Diagnosis not present

## 2022-08-07 DIAGNOSIS — I69391 Dysphagia following cerebral infarction: Secondary | ICD-10-CM | POA: Diagnosis not present

## 2022-08-07 DIAGNOSIS — D649 Anemia, unspecified: Secondary | ICD-10-CM | POA: Diagnosis not present

## 2022-08-07 DIAGNOSIS — I69351 Hemiplegia and hemiparesis following cerebral infarction affecting right dominant side: Secondary | ICD-10-CM | POA: Diagnosis not present

## 2022-08-11 DIAGNOSIS — I700 Atherosclerosis of aorta: Secondary | ICD-10-CM | POA: Diagnosis not present

## 2022-08-11 DIAGNOSIS — Z7982 Long term (current) use of aspirin: Secondary | ICD-10-CM | POA: Diagnosis not present

## 2022-08-11 DIAGNOSIS — I10 Essential (primary) hypertension: Secondary | ICD-10-CM | POA: Diagnosis not present

## 2022-08-11 DIAGNOSIS — K7460 Unspecified cirrhosis of liver: Secondary | ICD-10-CM | POA: Diagnosis not present

## 2022-08-11 DIAGNOSIS — Z6828 Body mass index (BMI) 28.0-28.9, adult: Secondary | ICD-10-CM | POA: Diagnosis not present

## 2022-08-11 DIAGNOSIS — E785 Hyperlipidemia, unspecified: Secondary | ICD-10-CM | POA: Diagnosis not present

## 2022-08-11 DIAGNOSIS — Z9181 History of falling: Secondary | ICD-10-CM | POA: Diagnosis not present

## 2022-08-11 DIAGNOSIS — K7581 Nonalcoholic steatohepatitis (NASH): Secondary | ICD-10-CM | POA: Diagnosis not present

## 2022-08-11 DIAGNOSIS — D649 Anemia, unspecified: Secondary | ICD-10-CM | POA: Diagnosis not present

## 2022-08-11 DIAGNOSIS — I69391 Dysphagia following cerebral infarction: Secondary | ICD-10-CM | POA: Diagnosis not present

## 2022-08-11 DIAGNOSIS — Z8572 Personal history of non-Hodgkin lymphomas: Secondary | ICD-10-CM | POA: Diagnosis not present

## 2022-08-11 DIAGNOSIS — E119 Type 2 diabetes mellitus without complications: Secondary | ICD-10-CM | POA: Diagnosis not present

## 2022-08-11 DIAGNOSIS — E669 Obesity, unspecified: Secondary | ICD-10-CM | POA: Diagnosis not present

## 2022-08-11 DIAGNOSIS — I69351 Hemiplegia and hemiparesis following cerebral infarction affecting right dominant side: Secondary | ICD-10-CM | POA: Diagnosis not present

## 2022-08-19 DIAGNOSIS — R21 Rash and other nonspecific skin eruption: Secondary | ICD-10-CM | POA: Diagnosis not present

## 2022-08-19 DIAGNOSIS — E876 Hypokalemia: Secondary | ICD-10-CM | POA: Diagnosis not present

## 2022-08-19 DIAGNOSIS — I10 Essential (primary) hypertension: Secondary | ICD-10-CM | POA: Diagnosis not present

## 2022-08-19 DIAGNOSIS — E440 Moderate protein-calorie malnutrition: Secondary | ICD-10-CM | POA: Diagnosis not present

## 2022-08-19 DIAGNOSIS — D649 Anemia, unspecified: Secondary | ICD-10-CM | POA: Diagnosis not present

## 2022-08-28 DIAGNOSIS — I700 Atherosclerosis of aorta: Secondary | ICD-10-CM | POA: Diagnosis not present

## 2022-08-28 DIAGNOSIS — I69351 Hemiplegia and hemiparesis following cerebral infarction affecting right dominant side: Secondary | ICD-10-CM | POA: Diagnosis not present

## 2022-08-28 DIAGNOSIS — E119 Type 2 diabetes mellitus without complications: Secondary | ICD-10-CM | POA: Diagnosis not present

## 2022-08-28 DIAGNOSIS — D649 Anemia, unspecified: Secondary | ICD-10-CM | POA: Diagnosis not present

## 2022-08-28 DIAGNOSIS — I10 Essential (primary) hypertension: Secondary | ICD-10-CM | POA: Diagnosis not present

## 2022-08-28 DIAGNOSIS — I69391 Dysphagia following cerebral infarction: Secondary | ICD-10-CM | POA: Diagnosis not present

## 2022-09-09 ENCOUNTER — Telehealth: Payer: Self-pay | Admitting: Family Medicine

## 2022-09-09 MED ORDER — CLONIDINE HCL 0.1 MG PO TABS: 0.1000 mg | ORAL_TABLET | Freq: Two times a day (BID) | ORAL | 0 refills | Status: DC

## 2022-09-09 NOTE — Telephone Encounter (Signed)
[facility] faxed refill request for the following medications:    cloNIDine (CATAPRES) 0.1 MG tablet  Please advise

## 2022-09-13 DIAGNOSIS — I69351 Hemiplegia and hemiparesis following cerebral infarction affecting right dominant side: Secondary | ICD-10-CM | POA: Diagnosis not present

## 2022-09-13 DIAGNOSIS — R54 Age-related physical debility: Secondary | ICD-10-CM | POA: Diagnosis not present

## 2022-09-13 DIAGNOSIS — I10 Essential (primary) hypertension: Secondary | ICD-10-CM | POA: Diagnosis not present

## 2022-09-13 DIAGNOSIS — E119 Type 2 diabetes mellitus without complications: Secondary | ICD-10-CM | POA: Diagnosis not present

## 2022-09-16 DIAGNOSIS — R54 Age-related physical debility: Secondary | ICD-10-CM | POA: Diagnosis not present

## 2022-09-16 DIAGNOSIS — Z8673 Personal history of transient ischemic attack (TIA), and cerebral infarction without residual deficits: Secondary | ICD-10-CM | POA: Diagnosis not present

## 2022-09-16 DIAGNOSIS — I10 Essential (primary) hypertension: Secondary | ICD-10-CM | POA: Diagnosis not present

## 2022-10-17 DIAGNOSIS — I10 Essential (primary) hypertension: Secondary | ICD-10-CM | POA: Diagnosis not present

## 2022-10-17 DIAGNOSIS — M1A9XX0 Chronic gout, unspecified, without tophus (tophi): Secondary | ICD-10-CM | POA: Diagnosis not present

## 2022-10-17 DIAGNOSIS — E876 Hypokalemia: Secondary | ICD-10-CM | POA: Diagnosis not present

## 2022-10-17 DIAGNOSIS — Z8673 Personal history of transient ischemic attack (TIA), and cerebral infarction without residual deficits: Secondary | ICD-10-CM | POA: Diagnosis not present

## 2022-10-17 DIAGNOSIS — M153 Secondary multiple arthritis: Secondary | ICD-10-CM | POA: Diagnosis not present

## 2022-10-17 DIAGNOSIS — R54 Age-related physical debility: Secondary | ICD-10-CM | POA: Diagnosis not present

## 2022-10-18 ENCOUNTER — Telehealth: Payer: Self-pay

## 2022-10-18 NOTE — Telephone Encounter (Signed)
Copied from [place] [id number]. Topic: Medicare AWV >> Oct 18, 2022  9:30 AM Ludger Nutting wrote: Patient's wife called to cancel patient's AWV on 10/21/22. Patient is seeing a different pcp due to needing in home health care.

## 2022-10-21 ENCOUNTER — Telehealth: Payer: Self-pay

## 2022-10-21 NOTE — Telephone Encounter (Signed)
PC SW LVM for patients spouse, to review new PC criteria.

## 2022-10-25 DIAGNOSIS — R351 Nocturia: Secondary | ICD-10-CM | POA: Diagnosis not present

## 2022-10-25 DIAGNOSIS — N401 Enlarged prostate with lower urinary tract symptoms: Secondary | ICD-10-CM | POA: Diagnosis not present

## 2022-11-14 DIAGNOSIS — M1A9XX0 Chronic gout, unspecified, without tophus (tophi): Secondary | ICD-10-CM | POA: Diagnosis not present

## 2022-11-14 DIAGNOSIS — Z8673 Personal history of transient ischemic attack (TIA), and cerebral infarction without residual deficits: Secondary | ICD-10-CM | POA: Diagnosis not present

## 2022-11-14 DIAGNOSIS — I10 Essential (primary) hypertension: Secondary | ICD-10-CM | POA: Diagnosis not present

## 2022-11-14 DIAGNOSIS — R54 Age-related physical debility: Secondary | ICD-10-CM | POA: Diagnosis not present

## 2022-11-14 DIAGNOSIS — E876 Hypokalemia: Secondary | ICD-10-CM | POA: Diagnosis not present

## 2022-11-14 DIAGNOSIS — M153 Secondary multiple arthritis: Secondary | ICD-10-CM | POA: Diagnosis not present

## 2022-11-21 ENCOUNTER — Telehealth: Payer: Self-pay

## 2022-11-21 NOTE — Telephone Encounter (Signed)
Palliative care x2 to patient. SW LVM for patient to complete telephonic check in and and review new PC criteria.      SW also, LVM with with patients spouse.

## 2022-11-22 ENCOUNTER — Other Ambulatory Visit: Payer: Medicare Other

## 2022-11-22 DIAGNOSIS — Z515 Encounter for palliative care: Secondary | ICD-10-CM

## 2022-11-22 NOTE — Progress Notes (Signed)
TELEPHONE ENCOUNTER  PC SW connected with patients spouse, [name], to complete telephonic check in and review new PC criteria.   SW advised spouse that [facility] new palliative care criteria is for patients who have active cancer dx and receiving treatment or are ready for hospice and hospice appropriate. Spouse shared that patient is doing well and does not meet the criteria at this time,   Plan: Patient will be discharged from active [facility] list. Spouse stated understanding and is aware that she can outreach again should patient have a decline.

## 2022-12-10 ENCOUNTER — Other Ambulatory Visit: Payer: Self-pay | Admitting: Family Medicine

## 2022-12-16 ENCOUNTER — Emergency Department: Payer: Medicare Other

## 2022-12-16 ENCOUNTER — Other Ambulatory Visit: Payer: Self-pay

## 2022-12-16 ENCOUNTER — Inpatient Hospital Stay
Admission: EM | Admit: 2022-12-16 | Discharge: 2022-12-22 | DRG: 291 | Disposition: A | Discharge status: Home Health | Payer: Medicare Other | Attending: Internal Medicine | Admitting: Internal Medicine

## 2022-12-16 DIAGNOSIS — I5033 Acute on chronic diastolic (congestive) heart failure: Secondary | ICD-10-CM | POA: Diagnosis present

## 2022-12-16 DIAGNOSIS — Z6837 Body mass index (BMI) 37.0-37.9, adult: Secondary | ICD-10-CM | POA: Diagnosis not present

## 2022-12-16 DIAGNOSIS — G459 Transient cerebral ischemic attack, unspecified: Secondary | ICD-10-CM | POA: Diagnosis not present

## 2022-12-16 DIAGNOSIS — E785 Hyperlipidemia, unspecified: Secondary | ICD-10-CM | POA: Diagnosis present

## 2022-12-16 DIAGNOSIS — Z79899 Other long term (current) drug therapy: Secondary | ICD-10-CM | POA: Diagnosis not present

## 2022-12-16 DIAGNOSIS — R7989 Other specified abnormal findings of blood chemistry: Secondary | ICD-10-CM | POA: Diagnosis not present

## 2022-12-16 DIAGNOSIS — I69351 Hemiplegia and hemiparesis following cerebral infarction affecting right dominant side: Secondary | ICD-10-CM | POA: Diagnosis not present

## 2022-12-16 DIAGNOSIS — J90 Pleural effusion, not elsewhere classified: Secondary | ICD-10-CM | POA: Diagnosis not present

## 2022-12-16 DIAGNOSIS — E1140 Type 2 diabetes mellitus with diabetic neuropathy, unspecified: Secondary | ICD-10-CM | POA: Diagnosis present

## 2022-12-16 DIAGNOSIS — I480 Paroxysmal atrial fibrillation: Secondary | ICD-10-CM | POA: Insufficient documentation

## 2022-12-16 DIAGNOSIS — K7460 Unspecified cirrhosis of liver: Secondary | ICD-10-CM | POA: Diagnosis present

## 2022-12-16 DIAGNOSIS — Z8572 Personal history of non-Hodgkin lymphomas: Secondary | ICD-10-CM

## 2022-12-16 DIAGNOSIS — Z809 Family history of malignant neoplasm, unspecified: Secondary | ICD-10-CM | POA: Diagnosis not present

## 2022-12-16 DIAGNOSIS — E8770 Fluid overload, unspecified: Secondary | ICD-10-CM | POA: Diagnosis not present

## 2022-12-16 DIAGNOSIS — I5043 Acute on chronic combined systolic (congestive) and diastolic (congestive) heart failure: Secondary | ICD-10-CM | POA: Diagnosis not present

## 2022-12-16 DIAGNOSIS — Z66 Do not resuscitate: Secondary | ICD-10-CM | POA: Diagnosis present

## 2022-12-16 DIAGNOSIS — K219 Gastro-esophageal reflux disease without esophagitis: Secondary | ICD-10-CM | POA: Diagnosis not present

## 2022-12-16 DIAGNOSIS — I420 Dilated cardiomyopathy: Secondary | ICD-10-CM

## 2022-12-16 DIAGNOSIS — E876 Hypokalemia: Secondary | ICD-10-CM | POA: Diagnosis present

## 2022-12-16 DIAGNOSIS — R069 Unspecified abnormalities of breathing: Secondary | ICD-10-CM | POA: Diagnosis not present

## 2022-12-16 DIAGNOSIS — E8779 Other fluid overload: Secondary | ICD-10-CM | POA: Diagnosis not present

## 2022-12-16 DIAGNOSIS — I6930 Unspecified sequelae of cerebral infarction: Secondary | ICD-10-CM | POA: Diagnosis not present

## 2022-12-16 DIAGNOSIS — E669 Obesity, unspecified: Secondary | ICD-10-CM | POA: Diagnosis present

## 2022-12-16 DIAGNOSIS — Z87891 Personal history of nicotine dependence: Secondary | ICD-10-CM | POA: Diagnosis not present

## 2022-12-16 DIAGNOSIS — Z1152 Encounter for screening for COVID-19: Secondary | ICD-10-CM | POA: Diagnosis not present

## 2022-12-16 DIAGNOSIS — Z833 Family history of diabetes mellitus: Secondary | ICD-10-CM

## 2022-12-16 DIAGNOSIS — E119 Type 2 diabetes mellitus without complications: Secondary | ICD-10-CM

## 2022-12-16 DIAGNOSIS — I509 Heart failure, unspecified: Secondary | ICD-10-CM | POA: Diagnosis not present

## 2022-12-16 DIAGNOSIS — R54 Age-related physical debility: Secondary | ICD-10-CM | POA: Diagnosis not present

## 2022-12-16 DIAGNOSIS — I110 Hypertensive heart disease with heart failure: Principal | ICD-10-CM | POA: Diagnosis present

## 2022-12-16 DIAGNOSIS — Z8673 Personal history of transient ischemic attack (TIA), and cerebral infarction without residual deficits: Secondary | ICD-10-CM | POA: Diagnosis not present

## 2022-12-16 DIAGNOSIS — M109 Gout, unspecified: Secondary | ICD-10-CM | POA: Diagnosis present

## 2022-12-16 DIAGNOSIS — I5031 Acute diastolic (congestive) heart failure: Secondary | ICD-10-CM | POA: Diagnosis not present

## 2022-12-16 DIAGNOSIS — I2510 Atherosclerotic heart disease of native coronary artery without angina pectoris: Secondary | ICD-10-CM | POA: Diagnosis present

## 2022-12-16 DIAGNOSIS — M153 Secondary multiple arthritis: Secondary | ICD-10-CM | POA: Diagnosis not present

## 2022-12-16 DIAGNOSIS — N400 Enlarged prostate without lower urinary tract symptoms: Secondary | ICD-10-CM | POA: Diagnosis not present

## 2022-12-16 DIAGNOSIS — Z7401 Bed confinement status: Secondary | ICD-10-CM

## 2022-12-16 DIAGNOSIS — R9431 Abnormal electrocardiogram [ECG] [EKG]: Secondary | ICD-10-CM | POA: Diagnosis present

## 2022-12-16 DIAGNOSIS — Z888 Allergy status to other drugs, medicaments and biological substances status: Secondary | ICD-10-CM | POA: Diagnosis not present

## 2022-12-16 DIAGNOSIS — J811 Chronic pulmonary edema: Secondary | ICD-10-CM | POA: Diagnosis not present

## 2022-12-16 DIAGNOSIS — I10 Essential (primary) hypertension: Secondary | ICD-10-CM | POA: Diagnosis not present

## 2022-12-16 DIAGNOSIS — Z743 Need for continuous supervision: Secondary | ICD-10-CM | POA: Diagnosis not present

## 2022-12-16 DIAGNOSIS — R0602 Shortness of breath: Secondary | ICD-10-CM | POA: Diagnosis not present

## 2022-12-16 DIAGNOSIS — R0902 Hypoxemia: Secondary | ICD-10-CM | POA: Diagnosis not present

## 2022-12-16 LAB — TROPONIN I (HIGH SENSITIVITY): Troponin I (High Sensitivity): 13 ng/L (ref <18)

## 2022-12-16 LAB — BASIC METABOLIC PANEL
Anion gap: 14 (ref 5–15)
BUN: 12 mg/dL (ref 8–23)
CO2: 24 mmol/L (ref 22–32)
Calcium: 9.2 mg/dL (ref 8.9–10.3)
Chloride: 102 mmol/L (ref 98–111)
Creatinine, Ser: 0.72 mg/dL (ref 0.61–1.24)
GFR, Estimated: >60 mL/min (ref >60)
Glucose, Bld: 174 mg/dL — ABNORMAL HIGH (ref 70–99)
Potassium: 3.1 mmol/L — ABNORMAL LOW (ref 3.5–5.1)
Sodium: 140 mmol/L (ref 135–145)

## 2022-12-16 LAB — CBC
HCT: 44 % (ref 39.0–52.0)
Hemoglobin: 12.7 g/dL — ABNORMAL LOW (ref 13.0–17.0)
MCH: 21.6 pg — ABNORMAL LOW (ref 26.0–34.0)
MCHC: 28.9 g/dL — ABNORMAL LOW (ref 30.0–36.0)
MCV: 75 fL — ABNORMAL LOW (ref 80.0–100.0)
Platelets: 363 10*3/uL (ref 150–400)
RBC: 5.87 MIL/uL — ABNORMAL HIGH (ref 4.22–5.81)
RDW: 19.7 % — ABNORMAL HIGH (ref 11.5–15.5)
WBC: 10.8 10*3/uL — ABNORMAL HIGH (ref 4.0–10.5)
nRBC: 0 % (ref 0.0–0.2)

## 2022-12-16 LAB — BRAIN NATRIURETIC PEPTIDE: B Natriuretic Peptide: 565.4 pg/mL — ABNORMAL HIGH (ref 0.0–100.0)

## 2022-12-16 MED ORDER — CLONIDINE HCL 0.1 MG PO TABS
0.1000 mg | ORAL_TABLET | Freq: Two times a day (BID) | ORAL | Status: DC
Start: 2022-12-16 — End: 2022-12-19
  Administered 2022-12-17 – 2022-12-18 (×4): 0.1 mg via ORAL
  Filled 2022-12-16 (×4): qty 1

## 2022-12-16 MED ORDER — POTASSIUM CHLORIDE CRYS ER 20 MEQ PO TBCR
40.0000 meq | EXTENDED_RELEASE_TABLET | Freq: Once | ORAL | Status: AC
  Administered 2022-12-16: 40 meq via ORAL
  Filled 2022-12-16: qty 2

## 2022-12-16 MED ORDER — ONDANSETRON HCL 4 MG/2ML IJ SOLN: 4.0000 mg | Freq: Four times a day (QID) | INTRAMUSCULAR | Status: DC | PRN

## 2022-12-16 MED ORDER — ENOXAPARIN SODIUM 60 MG/0.6ML IJ SOSY
0.5000 mg/kg | PREFILLED_SYRINGE | INTRAMUSCULAR | Status: DC
  Administered 2022-12-17 – 2022-12-19 (×3): 60 mg via SUBCUTANEOUS
  Filled 2022-12-16 (×3): qty 0.6

## 2022-12-16 MED ORDER — FUROSEMIDE 10 MG/ML IJ SOLN
40.0000 mg | Freq: Two times a day (BID) | INTRAMUSCULAR | Status: DC
  Administered 2022-12-17: 40 mg via INTRAVENOUS
  Filled 2022-12-16: qty 4

## 2022-12-16 MED ORDER — VITAMIN D 25 MCG (1000 UNIT) PO TABS
1000.0000 [IU] | ORAL_TABLET | Freq: Every day | ORAL | Status: DC
  Administered 2022-12-17 – 2022-12-22 (×6): 1000 [IU] via ORAL
  Filled 2022-12-16 (×6): qty 1

## 2022-12-16 MED ORDER — POTASSIUM CHLORIDE CRYS ER 20 MEQ PO TBCR
40.0000 meq | EXTENDED_RELEASE_TABLET | Freq: Two times a day (BID) | ORAL | Status: DC
  Administered 2022-12-17 (×2): 40 meq via ORAL
  Filled 2022-12-16 (×2): qty 2

## 2022-12-16 MED ORDER — ATORVASTATIN CALCIUM 80 MG PO TABS
80.0000 mg | ORAL_TABLET | Freq: Every day | ORAL | Status: DC
Start: 2022-12-17 — End: 2022-12-22
  Administered 2022-12-17 – 2022-12-22 (×6): 80 mg via ORAL
  Filled 2022-12-16 (×4): qty 1
  Filled 2022-12-16: qty 4
  Filled 2022-12-16: qty 1

## 2022-12-16 MED ORDER — ACETAMINOPHEN 325 MG PO TABS: 650.0000 mg | ORAL_TABLET | ORAL | Status: DC | PRN

## 2022-12-16 MED ORDER — ALLOPURINOL 100 MG PO TABS
300.0000 mg | ORAL_TABLET | Freq: Every day | ORAL | Status: DC
  Administered 2022-12-17 – 2022-12-22 (×6): 300 mg via ORAL
  Filled 2022-12-16 (×5): qty 3
  Filled 2022-12-16: qty 1

## 2022-12-16 MED ORDER — FUROSEMIDE 10 MG/ML IJ SOLN
40.0000 mg | Freq: Once | INTRAMUSCULAR | Status: AC
  Administered 2022-12-16: 40 mg via INTRAVENOUS
  Filled 2022-12-16: qty 4

## 2022-12-16 MED ORDER — ASPIRIN 325 MG PO TBEC
325.0000 mg | DELAYED_RELEASE_TABLET | Freq: Every day | ORAL | Status: DC
  Administered 2022-12-17 – 2022-12-20 (×4): 325 mg via ORAL
  Filled 2022-12-16 (×4): qty 1

## 2022-12-16 MED ORDER — AMLODIPINE BESYLATE 10 MG PO TABS
10.0000 mg | ORAL_TABLET | Freq: Every day | ORAL | Status: DC
  Administered 2022-12-17 – 2022-12-20 (×4): 10 mg via ORAL
  Filled 2022-12-16: qty 2
  Filled 2022-12-16 (×3): qty 1
  Filled 2022-12-16: qty 2

## 2022-12-16 MED ORDER — FINASTERIDE 5 MG PO TABS
5.0000 mg | ORAL_TABLET | Freq: Every day | ORAL | Status: DC
  Administered 2022-12-17 – 2022-12-22 (×6): 5 mg via ORAL
  Filled 2022-12-16 (×6): qty 1

## 2022-12-16 MED ORDER — POLYETHYLENE GLYCOL 3350 17 G PO PACK: 17.0000 g | PACK | Freq: Every day | ORAL | Status: DC | PRN

## 2022-12-16 MED ORDER — LOSARTAN POTASSIUM 50 MG PO TABS
100.0000 mg | ORAL_TABLET | Freq: Every day | ORAL | Status: DC
  Administered 2022-12-17 – 2022-12-18 (×2): 100 mg via ORAL
  Filled 2022-12-16 (×2): qty 2

## 2022-12-16 MED ORDER — IOHEXOL 350 MG/ML SOLN
75.0000 mL | Freq: Once | INTRAVENOUS | Status: AC | PRN
  Administered 2022-12-16: 75 mL via INTRAVENOUS

## 2022-12-16 MED ORDER — SODIUM CHLORIDE 0.9% FLUSH
3.0000 mL | Freq: Two times a day (BID) | INTRAVENOUS | Status: DC
  Administered 2022-12-17 – 2022-12-22 (×11): 3 mL via INTRAVENOUS

## 2022-12-16 MED ORDER — VITAMIN B-12 1000 MCG PO TABS
1000.0000 ug | ORAL_TABLET | Freq: Every day | ORAL | Status: DC
  Administered 2022-12-17 – 2022-12-22 (×6): 1000 ug via ORAL
  Filled 2022-12-16 (×4): qty 1
  Filled 2022-12-16: qty 2
  Filled 2022-12-16: qty 1

## 2022-12-16 MED ORDER — SODIUM CHLORIDE 0.9 % IV SOLN: 250.0000 mL | INTRAVENOUS | Status: DC | PRN

## 2022-12-16 MED ORDER — FERROUS SULFATE 325 (65 FE) MG PO TABS
325.0000 mg | ORAL_TABLET | ORAL | Status: DC
  Administered 2022-12-17 – 2022-12-21 (×3): 325 mg via ORAL
  Filled 2022-12-16 (×4): qty 1

## 2022-12-16 MED ORDER — SODIUM CHLORIDE 0.9% FLUSH
3.0000 mL | INTRAVENOUS | Status: DC | PRN
  Administered 2022-12-20: 3 mL via INTRAVENOUS

## 2022-12-16 NOTE — H&P (Addendum)
History and Physical:    Peter Ryan   [id number] DOB: 03-Jun-1951 DOA: 12/16/2022  Referring MD/provider: Delman Kitten, MD PCP: Jennette Dubin, NP   Patient coming from: Home  Chief Complaint: Shortness of breath  History of Present Illness:   Peter Ryan is a 72 y.o. male with medical history significant for stroke with right-sided hemiparesis and slurred speech at baseline, liver cirrhosis, hypertension, diabetes mellitus, dyslipidemia, chronic prostatitis, gout, BPH, bedbound status but cannot sit in the recliner, history of non-Hodgkin's lymphoma, history of thoracentesis, chronic hypokalemia, obesity, chronic anemia, who presented to the hospital because of shortness of breath and leg swelling.  He has been feeling short of breath for about 2 weeks now but this has progressively worsened.  He still occasionally has some mild leg swelling at baseline but he has noticed increasing swelling on the legs in the last few days preceding admission.  He feels weak, tired and lightheaded.  No fever, cough, chest pain, wheezing, vomiting, abdominal pain or diarrhea.  ED Course:  The patient was given IV Lasix and oral potassium chloride in the emergency department  ROS:   ROS all other systems reviewed were negative.  Past Medical History:   Past Medical History:  Diagnosis Date   Arthritis    BPH (benign prostatic hyperplasia)    Cancer (HCC)    Non-Hodgkins lymphoma   Chronic prostatitis    Cirrhosis of liver (HCC)    Diabetes mellitus without complication (HCC)    Dyslipidemia    Elevated PSA    Gastric reflux    Gout    HTN (hypertension)    Hyperlipidemia    Neuropathy    Over weight    Psoriasis    Testicular pain, right    Urinary frequency     Past Surgical History:   Past Surgical History:  Procedure Laterality Date   hydrocelectomy     IR FLUORO GUIDE PORT INSERTION RIGHT  04/11/2017   IR REMOVAL TUN ACCESS W/ PORT W/O FL MOD SED  05/29/2018    PILONIDAL CYST EXCISION     TEE WITHOUT CARDIOVERSION N/A 06/09/2020   Procedure: TRANSESOPHAGEAL ECHOCARDIOGRAM (TEE);  Surgeon: Minna Merritts, MD;  Location: [facility] ORS;  Service: Cardiovascular;  Laterality: N/A;    Social History:   Social History   Socioeconomic History   Marital status: Married    Spouse name: [name]   Number of children: 0   Years of education: Not on file   Highest education level: Some college, no degree  Occupational History   Occupation: retired  Tobacco Use   Smoking status: Former    Packs/day: 1.50    Years: 8.00    Additional pack years: 0.00    Total pack years: 12.00    Types: Cigarettes    Quit date: 05/29/1974    Years since quitting: 48.5   Smokeless tobacco: Never   Tobacco comments:    quit 40 years ago  Vaping Use   Vaping Use: Never used  Substance and Sexual Activity   Alcohol use: No    Alcohol/week: 0.0 standard drinks of alcohol    Comment: rarely   Drug use: No   Sexual activity: Not on file  Other Topics Concern   Not on file  Social History Narrative   Lives with wife   Right handed   Drinks 1-2 cups coffee   Social Determinants of Health   Financial Resource Strain: Low Risk  (10/17/2021)   Overall Financial Resource  Strain (CARDIA)    Difficulty of Paying Living Expenses: Not hard at all  Food Insecurity: No Food Insecurity (10/17/2021)   Hunger Vital Sign    Worried About Running Out of Food in the Last Year: Never true    Ran Out of Food in the Last Year: Never true  Transportation Needs: No Transportation Needs (10/17/2021)   PRAPARE - Hydrologist (Medical): No    Lack of Transportation (Non-Medical): No  Physical Activity: Insufficiently Active (10/17/2021)   Exercise Vital Sign    Days of Exercise per Week: 3 days    Minutes of Exercise per Session: 10 min  Stress: No Stress Concern Present (10/17/2021)   [place]    Feeling of Stress : Not at all  Social Connections: Socially Isolated (10/17/2021)   Social Connection and Isolation Panel [NHANES]    Frequency of Communication with Friends and Family: Never    Frequency of Social Gatherings with Friends and Family: Once a week    Attends Religious Services: Never    Marine scientist or Organizations: No    Attends Archivist Meetings: Never    Marital Status: Married  Human resources officer Violence: Not At Risk (10/17/2021)   Humiliation, Afraid, Rape, and Kick questionnaire    Fear of Current or Ex-Partner: No    Emotionally Abused: No    Physically Abused: No    Sexually Abused: No    Allergies   Hctz [hydrochlorothiazide] and Metformin  Family history:   Family History  Problem Relation Age of Onset   Uterine cancer Mother    Diabetes Mother    Cancer Mother    Cancer Maternal Uncle    Bladder Cancer Neg Hx    Kidney disease Neg Hx    Prostate cancer Neg Hx     Current Medications:   Prior to Admission medications   Medication Sig Start Date End Date Taking? Authorizing Provider  acetaminophen (TYLENOL) 325 MG tablet Take 2 tablets (650 mg total) by mouth every 4 (four) hours as needed for mild pain (or temp > 37.5 C (99.5 F)). 07/10/20   Angiulli, Lavon Paganini, PA-C  allopurinol (ZYLOPRIM) 300 MG tablet TAKE ONE TABLET EVERY DAY 06/11/21   Eulas Post, MD  amLODipine (NORVASC) 10 MG tablet TAKE ONE TABLET BY MOUTH EVERY DAY 08/08/21   Eulas Post, MD  aspirin EC 325 MG EC tablet Take 1 tablet (325 mg total) by mouth daily. 06/15/20   Sidney Ace, MD  atorvastatin (LIPITOR) 80 MG tablet TAKE ONE TABLET (80 MG) BY MOUTH EVERY DAY 07/09/21   Eulas Post, MD  BOUDREAUXS BUTT PASTE 40 % ointment Apply 1 application. topically 2 (two) times daily. 10/23/21   [provider]  Cholecalciferol (VITAMIN D3) 25 MCG (1000 UT) CAPS Take 1 capsule (1,000 Units total) by mouth daily.  07/10/20   Angiulli, Lavon Paganini, PA-C  cloNIDine (CATAPRES) 0.1 MG tablet Take 1 tablet (0.1 mg total) by mouth 2 (two) times daily. 09/09/22   Virginia Crews, MD  ferrous sulfate 325 (65 FE) MG tablet Take 1 tablet (325 mg total) by mouth every other day. 02/09/22 05/06/22  Jennye Boroughs, MD  finasteride (PROSCAR) 5 MG tablet Take 1 tablet (5 mg total) by mouth daily. 03/01/21   Zara Council A, PA-C  hydrocortisone 2.5 % lotion Apply topically as directed. Apply to affected areas as needed up to  three times weekly. 12/25/20   Ralene Bathe, MD  losartan (COZAAR) 100 MG tablet TAKE ONE TABLET EVERY DAY BY MOUTH 08/22/21   Eulas Post, MD  polyethylene glycol (MIRALAX / GLYCOLAX) 17 g packet Take 17 g by mouth daily as needed. 02/09/22   Jennye Boroughs, MD  potassium chloride SA (KLOR-CON M) 20 MEQ tablet Take 1 tablet (20 mEq total) by mouth daily. 02/09/22   Jennye Boroughs, MD  vitamin B-12 (CYANOCOBALAMIN) 1000 MCG tablet Take 1 tablet (1,000 mcg total) by mouth daily. 09/04/20   Eulas Post, MD    Physical Exam:   Vitals:   12/16/22 1313 12/16/22 1600 12/16/22 1715  BP: (!) 148/80 (!) 142/89   Pulse: 93 89   Resp: (!) 24 20   Temp: 97.7 F (36.5 C)  (!) 97.5 F (36.4 C)  TempSrc:   Oral  SpO2: 93% 93%   Weight: 117.9 kg    Height: 5\' 10"  (1.778 m)       Physical Exam: Blood pressure (!) 142/89, pulse 89, temperature (!) 97.5 F (36.4 C), temperature source Oral, resp. rate 20, height 5\' 10"  (1.778 m), weight 117.9 kg, SpO2 93 %. Gen: No acute distress. Head: Normocephalic, atraumatic. Eyes: Pupils equal, round and reactive to light. Extraocular movements intact.  Sclerae nonicteric.  Mouth: Moist mucous membranes Neck: Supple, no thyromegaly, no lymphadenopathy, no jugular venous distention. Chest: Bibasilar rales, decreased air entry at the lung bases.  No wheezing CV: Heart sounds are regular with an S1, S2. No murmurs, rubs or gallops.  Abdomen: Soft,  nontender, obese with normal active bowel sounds. No palpable masses. Extremities: Extremities are without clubbing, or cyanosis.  Significant bilateral leg edema without erythema or tenderness.  Pedal pulses 2+.  Skin: Warm and dry. No rashes Neuro: Alert and oriented times 3; slurred speech and right hemiparesis at baseline (power 3/5 in right upper and lower extremities).  Psych: Insight is good and judgment is appropriate. Mood and affect normal.   Data Review:    Labs: Basic Metabolic Panel: Recent Labs  Lab 12/16/22 1316  NA 140  K 3.1*  CL 102  CO2 24  GLUCOSE 174*  BUN 12  CREATININE 0.72  CALCIUM 9.2   Liver Function Tests: No results for input(s): "AST", "ALT", "ALKPHOS", "BILITOT", "PROT", "ALBUMIN" in the last 168 hours. No results for input(s): "LIPASE", "AMYLASE" in the last 168 hours. No results for input(s): "AMMONIA" in the last 168 hours. CBC: Recent Labs  Lab 12/16/22 1316  WBC 10.8*  HGB 12.7*  HCT 44.0  MCV 75.0*  PLT 363   Cardiac Enzymes: No results for input(s): "CKTOTAL", "CKMB", "CKMBINDEX", "TROPONINI" in the last 168 hours.  BNP (last 3 results) No results for input(s): "PROBNP" in the last 8760 hours. CBG: No results for input(s): "GLUCAP" in the last 168 hours.  Urinalysis    Component Value Date/Time   COLORURINE YELLOW (A) 02/07/2022 1400   APPEARANCEUR CLEAR (A) 02/07/2022 1400   APPEARANCEUR Clear 07/18/2016 0927   LABSPEC 1.013 02/07/2022 1400   PHURINE 5.0 02/07/2022 1400   GLUCOSEU NEGATIVE 02/07/2022 1400   HGBUR NEGATIVE 02/07/2022 1400   BILIRUBINUR NEGATIVE 02/07/2022 1400   BILIRUBINUR negative 09/07/2020 1505   BILIRUBINUR Negative 07/18/2016 0927   KETONESUR NEGATIVE 02/07/2022 1400   PROTEINUR NEGATIVE 02/07/2022 1400   UROBILINOGEN 0.2 09/07/2020 1505   NITRITE NEGATIVE 02/07/2022 1400   LEUKOCYTESUR NEGATIVE 02/07/2022 1400      Radiographic Studies: CT Angio Chest  PE W and/or Wo Contrast  Result  Date: 12/16/2022 CLINICAL DATA:  SOB EXAM: CT ANGIOGRAPHY CHEST WITH CONTRAST TECHNIQUE: Multidetector CT imaging of the chest was performed using the standard protocol during bolus administration of intravenous contrast. Multiplanar CT image reconstructions and MIPs were obtained to evaluate the vascular anatomy. RADIATION DOSE REDUCTION: This exam was performed according to the departmental dose-optimization program which includes automated exposure control, adjustment of the mA and/or kV according to patient size and/or use of iterative reconstruction technique. CONTRAST:  75 mL OMNIPAQUE IOHEXOL 350 MG/ML SOLN COMPARISON:  11/12/2016 FINDINGS: Cardiovascular: Satisfactory opacification of the pulmonary arteries to the segmental level. No evidence of pulmonary embolism. Normal heart size. No pericardial effusion. Atheromatous calcifications noted of coronary arteries. Reflux of contrast into the IVC and hepatic veins consistent with tricuspid insufficiency or right heart strain. Mediastinum/Nodes: No enlarged mediastinal, hilar, or axillary right paratracheal 1.5 cm node is nonspecific. Right prevascular adenopathy present previously is stable. There is an increase in the number of prominent mediastinal nodes. An AP window node measures 1.1 cm. Thyroid nodularity identified which can be correlated with outpatient ultrasound. Trachea and esophagus unremarkable. Lungs/Pleura: Moderate bilateral pleural effusions. Compressive atelectasis above the effusions. Ground-glass opacity consistent with pneumonitis or pulmonary edema in the aerated lungs. No filling defects in pulmonary arteries to indicate PE. No pneumothorax. Upper Abdomen: No acute abnormality. Musculoskeletal: Thoracic degenerative changes are noted. Review of the MIP images confirms the above findings. IMPRESSION: 1. No evidence of pulmonary embolism. 2. Bilateral pleural effusions. 3. Bibasilar consolidation or volume loss. 4. Ground-glass opacity  consistent with pulmonary edema or pneumonitis. 5. Nonspecific mediastinal adenopathy. Electronically Signed   By: Sammie Bench M.D.   On: 12/16/2022 16:41   DG Chest 2 View  Result Date: 12/16/2022 CLINICAL DATA:  Shortness of breath EXAM: CHEST - 2 VIEW COMPARISON:  04/18/2017 FINDINGS: Cardiomegaly. Pulmonary vascular congestion. Small left and trace right pleural effusions. Hazy left basilar airspace opacity. No pneumothorax. IMPRESSION: Cardiomegaly with pulmonary vascular congestion and left greater than right pleural effusions. Electronically Signed   By: Davina Poke D.O.   On: 12/16/2022 14:50    EKG: Independently reviewed by me showed normal sinus rhythm with PVCs.  No acute ST-T changes.    Assessment/Plan:   Principal Problem:   Acute on chronic diastolic CHF (congestive heart failure) (HCC) Active Problems:   Hypokalemia   Type 2 diabetes mellitus (HCC)   History of stroke with residual deficit   Liver cirrhosis (HCC)   Prolonged QT interval   Body mass index is 37.31 kg/m.  (Obesity)   Acute on chronic diastolic CHF: Admit to progressive cardiac unit.  Treat with IV Lasix.  Monitor BMP, daily weight and urine output.  Obtain 2D echo for further evaluation.  Of note, 2D echo in September 2021 showed EF estimated at [id number], grade 1 diastolic dysfunction.  Chronic hypokalemia: Replete potassium and monitor levels.  Check magnesium and replete as needed.  Prolonged QTc interval (504): This is likely due to hypokalemia.  Repeat EKG after potassium is repleted.  History of stroke: Continue aspirin and Lipitor  Liver cirrhosis: Compensated  Other comorbidities include hypertension, gout, BPH   Other information:   DVT prophylaxis: Lovenox  Code Status: DNR.  This was confirmed by patient and his wife at the bedside. Family Communication: Plan discussed with his wife at the bedside Disposition Plan: Plan to discharge home in 2 to 3 days Consults called:  None Admission status: Inpatient   The  medical decision making is of moderate complexity, therefore this is a level 2 visit.    Kimberlyn Quiocho [facility] Pager: Please check [email or web address]   How to contact the [facility] Attending or Consulting provider [place] or covering provider during after hours [place], for this patient?   Check the care team in [facility] and look for a) attending/consulting [facility] provider listed and b) the [facility] team listed Log into [email or web address] and use [place]'s universal password to access. If you do not have the password, please contact the hospital operator. Locate the [facility] provider you are looking for under [facility] and page to a number that you can be directly reached. If you still have difficulty reaching the provider, please page the [facility] (Director on Call) for the Hospitalists listed on amion for assistance.  12/16/2022, 6:20 PM

## 2022-12-16 NOTE — ED Provider Notes (Signed)
[facility] Provider Note    Event Date/Time   First MD Initiated Contact with Patient 12/16/22 1532     (approximate)   History   Shortness of Breath   HPI  Peter Ryan is a 72 y.o. male who on review of discharge summary from May has a history of symptomatic anemia hypokalemia type 2 diabetes appears to be a stroke and morbid obesity history of right-sided hemiparesis diffuse B-cell lymphoma   Increasing shortness of breath for about 2 weeks.  Along with this a slight feeling of congestion in the back of his throat but no difficulty swallowing.  Reports it feels like he cannot take a deep breath.  He does not walk at baseline, but feels like he is constantly and slowly getting more short of breath for about 2 weeks.  Said a slight cough.  No fevers or chills no body aches.  No pain or discomfort anywhere.  No chest pain.  No abdominal pain.  Frequent constipation, been suffering with same but this is not new.  Physical Exam   Triage Vital Signs: ED Triage Vitals [12/16/22 1313]  Enc Vitals Group     BP (!) 148/80     Pulse Rate 93     Resp (!) 24     Temp 97.7 F (36.5 C)     Temp src      SpO2 93 %     Weight 260 lb (117.9 kg)     Height 5\' 10"  (1.778 m)     Head Circumference      Peak Flow      Pain Score 0     Pain Loc      Pain Edu?      Excl. in [place]?     Most recent vital signs: Vitals:   12/16/22 1600 12/16/22 1715  BP: (!) 142/89   Pulse: 89   Resp: 20   Temp:  (!) 97.5 F (36.4 C)  SpO2: 93%      General: Awake, no distress but appears mildly tachypneic otherwise.  He and his wife both very pleasant CV:  Good peripheral perfusion.  Normal tones and rate.  No murmur Resp:  Normal effort except mild tachypnea and lung sounds are diminished bilaterally no obvious rales or wheezing.  No accessory muscle use, but again demonstrates mild tachypnea Abd:  No distention.  Soft nontender Other:  Flaccid paralysis right arm right  leg chronic   ED Results / Procedures / Treatments   Labs (all labs ordered are listed, but only abnormal results are displayed) Labs Reviewed  BASIC METABOLIC PANEL - Abnormal; Notable for the following components:      Result Value   Potassium 3.1 (*)    Glucose, Bld 174 (*)    All other components within normal limits  CBC - Abnormal; Notable for the following components:   WBC 10.8 (*)    RBC 5.87 (*)    Hemoglobin 12.7 (*)    MCV 75.0 (*)    MCH 21.6 (*)    MCHC 28.9 (*)    RDW 19.7 (*)    All other components within normal limits  BRAIN NATRIURETIC PEPTIDE - Abnormal; Notable for the following components:   B Natriuretic Peptide 565.4 (*)    All other components within normal limits  RESP PANEL BY RT-PCR (FLU A&B, COVID) [id number]  URINALYSIS, ROUTINE W REFLEX MICROSCOPIC  PROCALCITONIN  TROPONIN I (HIGH SENSITIVITY)     EKG  Entered  by me at 1320 heart rate 90 QRS 70 QTc 500 Normal sinus rhythm, probable old anteroseptal infarct.  No evidence of obvious acute ischemia though nonspecific T wave abnormality is present likely somewhat artifactual.  Appearance is similar in morphology to May 2023   RADIOLOGY  Chest x-ray interpreted by me as vascular congestion, mild to moderate size left-sided pleural effusion.  No obvious infiltrate, though possible in bases    CT Angio Chest PE W and/or Wo Contrast  Result Date: 12/16/2022 CLINICAL DATA:  SOB EXAM: CT ANGIOGRAPHY CHEST WITH CONTRAST TECHNIQUE: Multidetector CT imaging of the chest was performed using the standard protocol during bolus administration of intravenous contrast. Multiplanar CT image reconstructions and MIPs were obtained to evaluate the vascular anatomy. RADIATION DOSE REDUCTION: This exam was performed according to the departmental dose-optimization program which includes automated exposure control, adjustment of the mA and/or kV according to patient size and/or use of iterative reconstruction technique.  CONTRAST:  75 mL OMNIPAQUE IOHEXOL 350 MG/ML SOLN COMPARISON:  11/12/2016 FINDINGS: Cardiovascular: Satisfactory opacification of the pulmonary arteries to the segmental level. No evidence of pulmonary embolism. Normal heart size. No pericardial effusion. Atheromatous calcifications noted of coronary arteries. Reflux of contrast into the IVC and hepatic veins consistent with tricuspid insufficiency or right heart strain. Mediastinum/Nodes: No enlarged mediastinal, hilar, or axillary right paratracheal 1.5 cm node is nonspecific. Right prevascular adenopathy present previously is stable. There is an increase in the number of prominent mediastinal nodes. An AP window node measures 1.1 cm. Thyroid nodularity identified which can be correlated with outpatient ultrasound. Trachea and esophagus unremarkable. Lungs/Pleura: Moderate bilateral pleural effusions. Compressive atelectasis above the effusions. Ground-glass opacity consistent with pneumonitis or pulmonary edema in the aerated lungs. No filling defects in pulmonary arteries to indicate PE. No pneumothorax. Upper Abdomen: No acute abnormality. Musculoskeletal: Thoracic degenerative changes are noted. Review of the MIP images confirms the above findings. IMPRESSION: 1. No evidence of pulmonary embolism. 2. Bilateral pleural effusions. 3. Bibasilar consolidation or volume loss. 4. Ground-glass opacity consistent with pulmonary edema or pneumonitis. 5. Nonspecific mediastinal adenopathy. Electronically Signed   By: Sammie Bench M.D.   On: 12/16/2022 16:41   DG Chest 2 View  Result Date: 12/16/2022 CLINICAL DATA:  Shortness of breath EXAM: CHEST - 2 VIEW COMPARISON:  04/18/2017 FINDINGS: Cardiomegaly. Pulmonary vascular congestion. Small left and trace right pleural effusions. Hazy left basilar airspace opacity. No pneumothorax. IMPRESSION: Cardiomegaly with pulmonary vascular congestion and left greater than right pleural effusions. Electronically Signed   By:  Davina Poke D.O.   On: 12/16/2022 14:50      PROCEDURES:  Critical Care performed: No  Procedures   MEDICATIONS ORDERED IN ED: Medications  iohexol (OMNIPAQUE) 350 MG/ML injection 75 mL (75 mLs Intravenous Contrast Given 12/16/22 1622)  furosemide (LASIX) injection 40 mg (40 mg Intravenous Given 12/16/22 1721)  potassium chloride SA (KLOR-CON M) CR tablet 40 mEq (40 mEq Oral Given 12/16/22 1721)     IMPRESSION / MDM / ASSESSMENT AND PLAN / ED COURSE  I reviewed the triage vital signs and the nursing notes.                              Differential diagnosis includes, but is not limited to, pleural effusion, transudate, embolism, exudate, pneumonia, parapneumonic effusion, CHF, volume overload, hypoalbuminemia, etc.  Patient has edema in his lower extremities bilateral, as well as pleural effusions on his x-ray, his previous echocardiogram  demonstrated normal ejection fraction in 2021.  However, findings of volume overload seem to suggest congestive heart failure pulmonary edema may be at hand.   Patient's presentation is most consistent with acute complicated illness / injury requiring diagnostic workup.  Labs interpreted as hypokalemia potassium 3.1 this appears to be quite chronic in nature.  CBC with minimal leukocytosis, no acute anemia today, and appropriate platelet count.   The patient is on the cardiac monitor to evaluate for evidence of arrhythmia and/or significant heart rate changes.  ----------------------------------------- 5:18 PM on 12/16/2022 ----------------------------------------- CT imaging reviewed, BNP is somewhat elevated as well.  No known previous history of congestive heart failure reduced EF is noted on my review.  He has a very minimal leukocytosis but is afebrile.  Awaiting COVID and influenza test results, will add procalcitonin though my general clinical suspicion is that there is unlikely acute infection here.  I will consult with the  hospitalist, I have ordered diuresis with Lasix as I suspect volume overload by his edema on exam and his findings of elevated BNP and CT.  I consulted with her hospitalist Dr. Mal Misty.  Patient is now on 2 L nasal cannula, noted resting room air saturation 89%.  Suspect volume overload, but again cannot necessarily exclude infectious cause.  Discussed with our hospitalist and COVID and viral studies as well as procalcitonin are pending.  Patient will be seen evaluated and further recommendations and treatments on the hospital service.  Patient and his wife both understanding very agreeable with plan for admission     FINAL CLINICAL IMPRESSION(S) / ED DIAGNOSES   Final diagnoses:  Pleural effusion, bilateral  Elevated brain natriuretic peptide (BNP) level  Other hypervolemia     Rx / DC Orders   ED Discharge Orders     None        Note:  This document was prepared using Dragon voice recognition software and may include unintentional dictation errors.   Delman Kitten, MD 12/16/22 1736

## 2022-12-16 NOTE — ED Triage Notes (Signed)
Pt to ED via [facility] from home. Pt reports SOB x2 weeks. Lung sounds clear. Pt is paralyzed on right side from previous stroke. Pt RA 95%.

## 2022-12-16 NOTE — Progress Notes (Signed)
PHARMACIST - PHYSICIAN COMMUNICATION  CONCERNING:  Enoxaparin (Lovenox) for DVT Prophylaxis    RECOMMENDATION: Patient was prescribed enoxaprin 40mg  q24 hours for VTE prophylaxis.   Filed Weights   12/16/22 1313  Weight: 117.9 kg (260 lb)    Body mass index is 37.31 kg/m.  Estimated Creatinine Clearance: 112.2 mL/min (by C-G formula based on SCr of 0.72 mg/dL).   Based on [place] patient is candidate for enoxaparin 0.5mg /kg TBW SQ every 24 hours based on BMI being >30.  DESCRIPTION: Pharmacy has adjusted enoxaparin dose per [facility] policy.  Patient is now receiving enoxaparin 0.5 mg/kg every 24 hours   Renda Rolls, PharmD, [facility] 12/16/2022 11:28 PM

## 2022-12-16 NOTE — ED Triage Notes (Signed)
Pt to ED [facility] for shob and weakness for past few weeks, states has been worsening

## 2022-12-17 ENCOUNTER — Encounter: Payer: Self-pay | Admitting: Internal Medicine

## 2022-12-17 ENCOUNTER — Inpatient Hospital Stay (HOSPITAL_COMMUNITY)
Admit: 2022-12-17 | Discharge: 2022-12-17 | Disposition: A | Discharge status: Home / Self Care | Payer: Medicare Other | Attending: Internal Medicine | Admitting: Internal Medicine

## 2022-12-17 DIAGNOSIS — R9431 Abnormal electrocardiogram [ECG] [EKG]: Secondary | ICD-10-CM | POA: Diagnosis not present

## 2022-12-17 DIAGNOSIS — I5033 Acute on chronic diastolic (congestive) heart failure: Secondary | ICD-10-CM | POA: Diagnosis not present

## 2022-12-17 DIAGNOSIS — I5031 Acute diastolic (congestive) heart failure: Secondary | ICD-10-CM

## 2022-12-17 DIAGNOSIS — E876 Hypokalemia: Secondary | ICD-10-CM | POA: Diagnosis not present

## 2022-12-17 LAB — BASIC METABOLIC PANEL
Anion gap: 12 (ref 5–15)
BUN: 12 mg/dL (ref 8–23)
CO2: 26 mmol/L (ref 22–32)
Calcium: 8.6 mg/dL — ABNORMAL LOW (ref 8.9–10.3)
Chloride: 103 mmol/L (ref 98–111)
Creatinine, Ser: 0.82 mg/dL (ref 0.61–1.24)
GFR, Estimated: >60 mL/min (ref >60)
Glucose, Bld: 132 mg/dL — ABNORMAL HIGH (ref 70–99)
Potassium: 3.5 mmol/L (ref 3.5–5.1)
Sodium: 141 mmol/L (ref 135–145)

## 2022-12-17 LAB — PROCALCITONIN: Procalcitonin: <0.1 ng/mL

## 2022-12-17 LAB — CBC WITH DIFFERENTIAL/PLATELET
Abs Immature Granulocytes: 0.05 10*3/uL (ref 0.00–0.07)
Basophils Absolute: 0.1 10*3/uL (ref 0.0–0.1)
Basophils Relative: 1 %
Eosinophils Absolute: 0.3 10*3/uL (ref 0.0–0.5)
Eosinophils Relative: 2 %
HCT: 41.2 % (ref 39.0–52.0)
Hemoglobin: 12.1 g/dL — ABNORMAL LOW (ref 13.0–17.0)
Immature Granulocytes: 0 %
Lymphocytes Relative: 17 %
Lymphs Abs: 2 10*3/uL (ref 0.7–4.0)
MCH: 22.1 pg — ABNORMAL LOW (ref 26.0–34.0)
MCHC: 29.4 g/dL — ABNORMAL LOW (ref 30.0–36.0)
MCV: 75.2 fL — ABNORMAL LOW (ref 80.0–100.0)
Monocytes Absolute: 1.1 10*3/uL — ABNORMAL HIGH (ref 0.1–1.0)
Monocytes Relative: 9 %
Neutro Abs: 8.6 10*3/uL — ABNORMAL HIGH (ref 1.7–7.7)
Neutrophils Relative %: 71 %
Platelets: 345 10*3/uL (ref 150–400)
RBC: 5.48 MIL/uL (ref 4.22–5.81)
RDW: 19.4 % — ABNORMAL HIGH (ref 11.5–15.5)
WBC: 12.1 10*3/uL — ABNORMAL HIGH (ref 4.0–10.5)
nRBC: 0 % (ref 0.0–0.2)

## 2022-12-17 LAB — MAGNESIUM: Magnesium: 1.8 mg/dL (ref 1.7–2.4)

## 2022-12-17 MED ORDER — PERFLUTREN LIPID MICROSPHERE
1.0000 mL | INTRAVENOUS | Status: AC | PRN
  Administered 2022-12-17: 2 mL via INTRAVENOUS

## 2022-12-17 MED ORDER — FUROSEMIDE 10 MG/ML IJ SOLN
60.0000 mg | Freq: Two times a day (BID) | INTRAMUSCULAR | Status: DC
  Administered 2022-12-17 – 2022-12-18 (×3): 60 mg via INTRAVENOUS
  Filled 2022-12-17 (×3): qty 6

## 2022-12-17 MED ORDER — FUROSEMIDE 10 MG/ML IJ SOLN
20.0000 mg | Freq: Once | INTRAMUSCULAR | Status: AC
  Administered 2022-12-17: 20 mg via INTRAVENOUS
  Filled 2022-12-17: qty 4

## 2022-12-17 NOTE — Progress Notes (Signed)
Progress Note    Peter Ryan  [id number] DOB: 02-05-1951  DOA: 12/16/2022 PCP: Jennette Dubin, NP      Brief Narrative:    Medical records reviewed and are as summarized below:  Peter Ryan is a 72 y.o. male medical history significant for stroke with right-sided hemiparesis and slurred speech at baseline, liver cirrhosis, hypertension, diabetes mellitus, dyslipidemia, chronic prostatitis, gout, BPH, bedbound status but cannot sit in the recliner, history of non-Hodgkin's lymphoma, history of thoracentesis, chronic hypokalemia, obesity, chronic anemia, who presented to the hospital because of shortness of breath and leg swelling. He has been feeling short of breath for about 2 weeks now but this has progressively worsened. He still occasionally has some mild leg swelling at baseline but he has noticed increasing swelling on the legs in the last few days preceding admission.  He also felt weak, tired and lightheaded.   Oxygen saturation was 89% on room air in the emergency department.  He was admitted to the hospital for acute exacerbation of chronic diastolic CHF.  He was treated with IV Lasix.  Potassium was low so he was treated with oral potassium chloride.   Assessment/Plan:   Principal Problem:   Acute on chronic diastolic CHF (congestive heart failure) (HCC) Active Problems:   Hypokalemia   Type 2 diabetes mellitus (HCC)   History of stroke with residual deficit   Liver cirrhosis (HCC)   Prolonged QT interval    Body mass index is 37.31 kg/m.  (Obesity)   Acute on chronic diastolic CHF: Increase dose of IV Lasix from 40 mg to 60 mg twice daily.  Monitor BMP, daily weight and urine output.  2D echo is pending.   Of note, 2D echo in September 2021 showed EF estimated at [id number], grade 1 diastolic dysfunction.   Chronic hypokalemia: Potassium is better but continue potassium repletion because of ongoing diuresis.   Prolonged QTc interval (504): This is  likely due to hypokalemia.  Repeat EKG tomorrow   History of stroke: Continue aspirin and Lipitor   Liver cirrhosis: Compensated   Other comorbidities include hypertension, gout, BPH   Diet Order             Diet 2 gram sodium Room service appropriate? Yes; Fluid consistency: Thin  Diet effective now                            Consultants: None  Procedures: None    Medications:    allopurinol  300 mg Oral Daily   amLODipine  10 mg Oral Daily   aspirin EC  325 mg Oral Daily   atorvastatin  80 mg Oral Daily   cholecalciferol  1,000 Units Oral Daily   cloNIDine  0.1 mg Oral BID   cyanocobalamin  1,000 mcg Oral Daily   enoxaparin (LOVENOX) injection  0.5 mg/kg Subcutaneous Q24H   ferrous sulfate  325 mg Oral QODAY   finasteride  5 mg Oral Daily   furosemide  40 mg Intravenous BID   losartan  100 mg Oral Daily   potassium chloride SA  40 mEq Oral BID   sodium chloride flush  3 mL Intravenous Q12H   Continuous Infusions:  sodium chloride       Anti-infectives (From admission, onward)    None              Family Communication/Anticipated D/C date and plan/Code Status   DVT prophylaxis: SCDs Start:  12/16/22 2322     Code Status: DNR  Family Communication: None Disposition Plan: Plan to discharge home in 1 to 2 days   Status is: Inpatient Remains inpatient appropriate because: IV Lasix for CHF exacerbation       Subjective:   Interval events noted.  No chest pain or shortness of breath.  Objective:    Vitals:   12/17/22 0518 12/17/22 0800 12/17/22 0900 12/17/22 1030  BP: (!) 137/55 (!) 153/72 (!) 148/77 138/79  Pulse: 79 (!) 50 (!) 45 74  Resp: 19 (!) 25 19 (!) 23  Temp: 97.7 F (36.5 C)     TempSrc: Oral     SpO2: 96% 94% 92% 95%  Weight:      Height:       No data found.  No intake or output data in the 24 hours ending 12/17/22 1135 Filed Weights   12/16/22 1313  Weight: 117.9 kg    Exam:  GEN:  NAD SKIN: Warm and dry EYES: No pallor or icterus ENT: MMM CV: RRR PULM: CTA B ABD: soft, obese, NT, +BS CNS: AAO x 3, right hemiparesis (power 3/5 in right upper and lower extremities) and slurred speech at baseline EXT: Bilateral leg edema 2+, no tenderness or erythema        Data Reviewed:   I have personally reviewed following labs and imaging studies:  Labs: Labs show the following:   Basic Metabolic Panel: Recent Labs  Lab 12/16/22 1316 12/17/22 0503  NA 140 141  K 3.1* 3.5  CL 102 103  CO2 24 26  GLUCOSE 174* 132*  BUN 12 12  CREATININE 0.72 0.82  CALCIUM 9.2 8.6*  MG  --  1.8   GFR Estimated Creatinine Clearance: 109.4 mL/min (by C-G formula based on SCr of 0.82 mg/dL). Liver Function Tests: No results for input(s): "AST", "ALT", "ALKPHOS", "BILITOT", "PROT", "ALBUMIN" in the last 168 hours. No results for input(s): "LIPASE", "AMYLASE" in the last 168 hours. No results for input(s): "AMMONIA" in the last 168 hours. Coagulation profile No results for input(s): "INR", "PROTIME" in the last 168 hours.  CBC: Recent Labs  Lab 12/16/22 1316 12/17/22 0503  WBC 10.8* 12.1*  NEUTROABS  --  8.6*  HGB 12.7* 12.1*  HCT 44.0 41.2  MCV 75.0* 75.2*  PLT 363 345   Cardiac Enzymes: No results for input(s): "CKTOTAL", "CKMB", "CKMBINDEX", "TROPONINI" in the last 168 hours. BNP (last 3 results) No results for input(s): "PROBNP" in the last 8760 hours. CBG: No results for input(s): "GLUCAP" in the last 168 hours. D-Dimer: No results for input(s): "DDIMER" in the last 72 hours. Hgb A1c: No results for input(s): "HGBA1C" in the last 72 hours. Lipid Profile: No results for input(s): "CHOL", "HDL", "LDLCALC", "TRIG", "CHOLHDL", "LDLDIRECT" in the last 72 hours. Thyroid function studies: No results for input(s): "TSH", "T4TOTAL", "T3FREE", "THYROIDAB" in the last 72 hours.  Invalid input(s): "FREET3" Anemia work up: No results for input(s): "VITAMINB12",  "FOLATE", "FERRITIN", "TIBC", "IRON", "RETICCTPCT" in the last 72 hours. Sepsis Labs: Recent Labs  Lab 12/16/22 1316 12/17/22 0503  PROCALCITON <0.10  --   WBC 10.8* 12.1*    Microbiology No results found for this or any previous visit (from the past 240 hour(s)).  Procedures and diagnostic studies:  CT Angio Chest PE W and/or Wo Contrast  Result Date: 12/16/2022 CLINICAL DATA:  SOB EXAM: CT ANGIOGRAPHY CHEST WITH CONTRAST TECHNIQUE: Multidetector CT imaging of the chest was performed using the standard protocol during  bolus administration of intravenous contrast. Multiplanar CT image reconstructions and MIPs were obtained to evaluate the vascular anatomy. RADIATION DOSE REDUCTION: This exam was performed according to the departmental dose-optimization program which includes automated exposure control, adjustment of the mA and/or kV according to patient size and/or use of iterative reconstruction technique. CONTRAST:  75 mL OMNIPAQUE IOHEXOL 350 MG/ML SOLN COMPARISON:  11/12/2016 FINDINGS: Cardiovascular: Satisfactory opacification of the pulmonary arteries to the segmental level. No evidence of pulmonary embolism. Normal heart size. No pericardial effusion. Atheromatous calcifications noted of coronary arteries. Reflux of contrast into the IVC and hepatic veins consistent with tricuspid insufficiency or right heart strain. Mediastinum/Nodes: No enlarged mediastinal, hilar, or axillary right paratracheal 1.5 cm node is nonspecific. Right prevascular adenopathy present previously is stable. There is an increase in the number of prominent mediastinal nodes. An AP window node measures 1.1 cm. Thyroid nodularity identified which can be correlated with outpatient ultrasound. Trachea and esophagus unremarkable. Lungs/Pleura: Moderate bilateral pleural effusions. Compressive atelectasis above the effusions. Ground-glass opacity consistent with pneumonitis or pulmonary edema in the aerated lungs. No filling  defects in pulmonary arteries to indicate PE. No pneumothorax. Upper Abdomen: No acute abnormality. Musculoskeletal: Thoracic degenerative changes are noted. Review of the MIP images confirms the above findings. IMPRESSION: 1. No evidence of pulmonary embolism. 2. Bilateral pleural effusions. 3. Bibasilar consolidation or volume loss. 4. Ground-glass opacity consistent with pulmonary edema or pneumonitis. 5. Nonspecific mediastinal adenopathy. Electronically Signed   By: Sammie Bench M.D.   On: 12/16/2022 16:41   DG Chest 2 View  Result Date: 12/16/2022 CLINICAL DATA:  Shortness of breath EXAM: CHEST - 2 VIEW COMPARISON:  04/18/2017 FINDINGS: Cardiomegaly. Pulmonary vascular congestion. Small left and trace right pleural effusions. Hazy left basilar airspace opacity. No pneumothorax. IMPRESSION: Cardiomegaly with pulmonary vascular congestion and left greater than right pleural effusions. Electronically Signed   By: Davina Poke D.O.   On: 12/16/2022 14:50               LOS: 1 day   Quron Ruddy  [facility]   Pager on [email or web address]. If 7PM-7AM, please contact night-coverage at [email or web address]     12/17/2022, 11:35 AM

## 2022-12-17 NOTE — Plan of Care (Signed)

## 2022-12-17 NOTE — ED Notes (Signed)
Upon initial assessment, pt soaked in urine. Pt says he can't use a urinal independently due to R hemiparesis secondary to CVA. Pt bed sheets changed. Pt cleaned with bath wipes. New absorbent pads placed between sheets and patient. Male purewick applied after peri care performed. Pt tolerated activity without adverse complications.

## 2022-12-17 NOTE — Discharge Instructions (Signed)

## 2022-12-18 DIAGNOSIS — I5043 Acute on chronic combined systolic (congestive) and diastolic (congestive) heart failure: Secondary | ICD-10-CM

## 2022-12-18 DIAGNOSIS — J90 Pleural effusion, not elsewhere classified: Principal | ICD-10-CM | POA: Diagnosis present

## 2022-12-18 DIAGNOSIS — Z8673 Personal history of transient ischemic attack (TIA), and cerebral infarction without residual deficits: Secondary | ICD-10-CM

## 2022-12-18 DIAGNOSIS — K7460 Unspecified cirrhosis of liver: Secondary | ICD-10-CM

## 2022-12-18 LAB — RESP PANEL BY RT-PCR (FLU A&B, COVID) ARPGX2
Influenza A by PCR: NEGATIVE
Influenza B by PCR: NEGATIVE
SARS Coronavirus 2 by RT PCR: NEGATIVE

## 2022-12-18 LAB — MAGNESIUM: Magnesium: 1.9 mg/dL (ref 1.7–2.4)

## 2022-12-18 LAB — BASIC METABOLIC PANEL
Anion gap: 7 (ref 5–15)
BUN: 18 mg/dL (ref 8–23)
CO2: 26 mmol/L (ref 22–32)
Calcium: 8.3 mg/dL — ABNORMAL LOW (ref 8.9–10.3)
Chloride: 107 mmol/L (ref 98–111)
Creatinine, Ser: 0.84 mg/dL (ref 0.61–1.24)
GFR, Estimated: >60 mL/min (ref >60)
Glucose, Bld: 126 mg/dL — ABNORMAL HIGH (ref 70–99)
Potassium: 3.6 mmol/L (ref 3.5–5.1)
Sodium: 140 mmol/L (ref 135–145)

## 2022-12-18 LAB — ECHOCARDIOGRAM COMPLETE
Area-P 1/2: 5.07 cm2
Height: 70 in
S' Lateral: 4.5 cm
Weight: 4160 oz

## 2022-12-18 LAB — URINALYSIS, ROUTINE W REFLEX MICROSCOPIC
Bacteria, UA: NONE SEEN
Bilirubin Urine: NEGATIVE
Glucose, UA: NEGATIVE mg/dL
Hgb urine dipstick: NEGATIVE
Ketones, ur: NEGATIVE mg/dL
Leukocytes,Ua: NEGATIVE
Nitrite: NEGATIVE
Protein, ur: 30 mg/dL — AB
Specific Gravity, Urine: 1.024 (ref 1.005–1.030)
Squamous Epithelial / HPF: NONE SEEN /HPF (ref 0–5)
pH: 5 (ref 5.0–8.0)

## 2022-12-18 MED ORDER — POTASSIUM CHLORIDE CRYS ER 20 MEQ PO TBCR
40.0000 meq | EXTENDED_RELEASE_TABLET | Freq: Three times a day (TID) | ORAL | Status: DC
  Administered 2022-12-18 – 2022-12-19 (×4): 40 meq via ORAL
  Filled 2022-12-18 (×4): qty 2

## 2022-12-18 MED ORDER — CALCIUM CARBONATE ANTACID 500 MG PO CHEW
1.0000 | CHEWABLE_TABLET | Freq: Three times a day (TID) | ORAL | Status: DC | PRN
  Administered 2022-12-18: 200 mg via ORAL
  Filled 2022-12-18: qty 1

## 2022-12-18 MED ORDER — SPIRONOLACTONE 25 MG PO TABS
25.0000 mg | ORAL_TABLET | Freq: Every day | ORAL | Status: DC
  Administered 2022-12-18 – 2022-12-22 (×5): 25 mg via ORAL
  Filled 2022-12-18 (×5): qty 1

## 2022-12-18 MED ORDER — ORAL CARE MOUTH RINSE: 15.0000 mL | OROMUCOSAL | Status: DC | PRN

## 2022-12-18 NOTE — Plan of Care (Signed)
  Problem: Education: Goal: Knowledge of General Education information will improve Description: Including pain rating scale, medication(s)/side effects and non-pharmacologic comfort measures 12/18/2022 0804 by Shauna Hugh, RN Outcome: Progressing 12/17/2022 1823 by Shauna Hugh, RN Outcome: Progressing   Problem: Health Behavior/Discharge Planning: Goal: Ability to manage health-related needs will improve 12/18/2022 0804 by Shauna Hugh, RN Outcome: Progressing 12/17/2022 1823 by Shauna Hugh, RN Outcome: Progressing   Problem: Clinical Measurements: Goal: Ability to maintain clinical measurements within normal limits will improve 12/18/2022 0804 by Shauna Hugh, RN Outcome: Progressing 12/17/2022 1823 by Shauna Hugh, RN Outcome: Progressing Goal: Will remain free from infection 12/18/2022 0804 by Shauna Hugh, RN Outcome: Progressing 12/17/2022 1823 by Shauna Hugh, RN Outcome: Progressing Goal: Diagnostic test results will improve 12/18/2022 0804 by Shauna Hugh, RN Outcome: Progressing 12/17/2022 1823 by Shauna Hugh, RN Outcome: Progressing Goal: Respiratory complications will improve 12/18/2022 0804 by Shauna Hugh, RN Outcome: Progressing 12/17/2022 1823 by Shauna Hugh, RN Outcome: Progressing Goal: Cardiovascular complication will be avoided 12/18/2022 0804 by Shauna Hugh, RN Outcome: Progressing 12/17/2022 1823 by Shauna Hugh, RN Outcome: Progressing   Problem: Activity: Goal: Risk for activity intolerance will decrease 12/18/2022 0804 by Shauna Hugh, RN Outcome: Progressing 12/17/2022 1823 by Shauna Hugh, RN Outcome: Progressing   Problem: Nutrition: Goal: Adequate nutrition will be maintained 12/18/2022 0804 by Shauna Hugh, RN Outcome: Progressing 12/17/2022 1823 by Shauna Hugh, RN Outcome: Progressing   Problem: Coping: Goal: Level of anxiety will decrease 12/18/2022 0804 by Shauna Hugh, RN Outcome: Progressing 12/17/2022 1823 by  Shauna Hugh, RN Outcome: Progressing   Problem: Elimination: Goal: Will not experience complications related to bowel motility 12/18/2022 0804 by Shauna Hugh, RN Outcome: Progressing 12/17/2022 1823 by Shauna Hugh, RN Outcome: Progressing Goal: Will not experience complications related to urinary retention 12/18/2022 0804 by Shauna Hugh, RN Outcome: Progressing 12/17/2022 1823 by Shauna Hugh, RN Outcome: Progressing   Problem: Pain Managment: Goal: General experience of comfort will improve 12/18/2022 0804 by Shauna Hugh, RN Outcome: Progressing 12/17/2022 1823 by Shauna Hugh, RN Outcome: Progressing   Problem: Safety: Goal: Ability to remain free from injury will improve 12/18/2022 0804 by Shauna Hugh, RN Outcome: Progressing 12/17/2022 1823 by Shauna Hugh, RN Outcome: Progressing   Problem: Skin Integrity: Goal: Risk for impaired skin integrity will decrease 12/18/2022 0804 by Shauna Hugh, RN Outcome: Progressing 12/17/2022 1823 by Shauna Hugh, RN Outcome: Progressing   Problem: Education: Goal: Ability to demonstrate management of disease process will improve Outcome: Progressing Goal: Ability to verbalize understanding of medication therapies will improve Outcome: Progressing Goal: Individualized Educational Video(s) Outcome: Progressing   Problem: Activity: Goal: Capacity to carry out activities will improve Outcome: Progressing   Problem: Cardiac: Goal: Ability to achieve and maintain adequate cardiopulmonary perfusion will improve Outcome: Progressing

## 2022-12-18 NOTE — Consult Note (Signed)
ADVANCED HEART FAILURE CLINIC NOTE  Referring Physician: No ref. provider found  Primary Care: Peter Dubin, NP   HPI: Peter Ryan is a 72 y.o. male with history of stroke and significant right-sided hemiparesis, liver cirrhosis, hypertension, type 2 diabetes, dyslipidemia, mild BPH, history of non-Hodgkin's lymphoma, obesity presenting to the hospital with a several day history of progressively worsening shortness of breath.  According to Peter Ryan at baseline he is bedbound and unable to care for himself.  He is primary caregiver is his wife who manages his medications and helps take care of him.  He has been nonambulatory since prior stroke.  He reports being in his usual state of health several days ago when he started to become increasingly short of breath with rapid progression of lower extremity swelling.  Due to progression of symptoms he came to the [facility] emergency department where he was found to be hypoxic to 89% on room air and severely volume overloaded.  Patient was given IV Lasix with significant improvement in symptoms.  In addition he had a CT chest that was negative for PE and admitted to the hospitalist team.  Since that time he has had an echocardiogram that demonstrated severely reduced EF.  Past Medical History:  Diagnosis Date   Arthritis    BPH (benign prostatic hyperplasia)    Cancer (HCC)    Non-Hodgkins lymphoma   Chronic prostatitis    Cirrhosis of liver ([place])    Diabetes mellitus without complication ([place])    Dyslipidemia    Elevated PSA    Gastric reflux    Gout    HTN (hypertension)    Hyperlipidemia    Neuropathy    Over weight    Psoriasis    Testicular pain, right    Urinary frequency     Current Facility-Administered Medications  Medication Dose Route Frequency Provider Last Rate Last Admin   0.9 %  sodium chloride infusion  250 mL Intravenous PRN Peter Boroughs, MD       acetaminophen (TYLENOL) tablet 650 mg  650 mg Oral Q4H PRN  Peter Boroughs, MD       allopurinol (ZYLOPRIM) tablet 300 mg  300 mg Oral Daily Peter Boroughs, MD   300 mg at 12/18/22 1039   amLODipine (NORVASC) tablet 10 mg  10 mg Oral Daily Peter Boroughs, MD   10 mg at 12/18/22 1038   aspirin EC tablet 325 mg  325 mg Oral Daily Peter Boroughs, MD   325 mg at 12/18/22 1038   atorvastatin (LIPITOR) tablet 80 mg  80 mg Oral Daily Peter Boroughs, MD   80 mg at 12/18/22 1039   cholecalciferol (VITAMIN D3) 25 MCG (1000 UNIT) tablet 1,000 Units  1,000 Units Oral Daily Peter Boroughs, MD   1,000 Units at 12/18/22 1039   cloNIDine (CATAPRES) tablet 0.1 mg  0.1 mg Oral BID Peter Boroughs, MD   0.1 mg at 12/18/22 1039   cyanocobalamin (VITAMIN B12) tablet 1,000 mcg  1,000 mcg Oral Daily Peter Boroughs, MD   1,000 mcg at 12/18/22 1039   enoxaparin (LOVENOX) injection 60 mg  0.5 mg/kg Subcutaneous Q24H Peter Boroughs, MD   60 mg at 12/18/22 0900   ferrous sulfate tablet 325 mg  325 mg Oral Peter Pearson, MD   325 mg at 12/17/22 0900   finasteride (PROSCAR) tablet 5 mg  5 mg Oral Daily Peter Boroughs, MD   5 mg at 12/18/22 1038   furosemide (LASIX) injection 60 mg  60 mg Intravenous BID Peter Boroughs, MD   60 mg at 12/18/22 1038   losartan (COZAAR) tablet 100 mg  100 mg Oral Daily Peter Boroughs, MD   100 mg at 12/18/22 1038   ondansetron (ZOFRAN) injection 4 mg  4 mg Intravenous Q6H PRN Peter Boroughs, MD       Oral care mouth rinse  15 mL Mouth Rinse PRN Peter Boroughs, MD       polyethylene glycol (MIRALAX / GLYCOLAX) packet 17 g  17 g Oral Daily PRN Peter Boroughs, MD       potassium chloride SA (KLOR-CON M) CR tablet 40 mEq  40 mEq Oral TID Peter Boroughs, MD   40 mEq at 12/18/22 1038   sodium chloride flush (NS) 0.9 % injection 3 mL  3 mL Intravenous Q12H Peter Boroughs, MD   3 mL at 12/18/22 1039   sodium chloride flush (NS) 0.9 % injection 3 mL  3 mL Intravenous PRN Peter Boroughs, MD       Facility-Administered Medications Ordered in Other Encounters   Medication Dose Route Frequency Provider Last Rate Last Admin   sodium chloride flush (NS) 0.9 % injection 10 mL  10 mL Intravenous PRN Peter Guadeloupe, MD   10 mL at 05/12/18 1421    Allergies  Allergen Reactions   Hctz [Hydrochlorothiazide] Other (See Comments)    Gout flare   Metformin Other (See Comments)    Stomach upset       Social History   Socioeconomic History   Marital status: Married    Spouse name: Peter Ryan   Number of children: 0   Years of education: Not on file   Highest education level: Some college, no degree  Occupational History   Occupation: retired  Tobacco Use   Smoking status: Former    Packs/day: 1.50    Years: 8.00    Additional pack years: 0.00    Total pack years: 12.00    Types: Cigarettes    Quit date: 05/29/1974    Years since quitting: 48.5   Smokeless tobacco: Never   Tobacco comments:    quit 40 years ago  Vaping Use   Vaping Use: Never used  Substance and Sexual Activity   Alcohol use: No    Alcohol/week: 0.0 standard drinks of alcohol    Comment: rarely   Drug use: No   Sexual activity: Not on file  Other Topics Concern   Not on file  Social History Narrative   Lives with wife   Right handed   Drinks 1-2 cups coffee   Social Determinants of Health   Financial Resource Strain: Low Risk  (10/17/2021)   Overall Financial Resource Strain (CARDIA)    Difficulty of Paying Living Expenses: Not hard at all  Food Insecurity: No Food Insecurity (12/17/2022)   Hunger Vital Sign    Worried About Running Out of Food in the Last Year: Never true    Ran Out of Food in the Last Year: Never true  Transportation Needs: No Transportation Needs (12/17/2022)   PRAPARE - Hydrologist (Medical): No    Lack of Transportation (Non-Medical): No  Physical Activity: Insufficiently Active (10/17/2021)   Exercise Vital Sign    Days of Exercise per Week: 3 days    Minutes of Exercise per Session: 10 min  Stress: No Stress  Concern Present (10/17/2021)   [place]    Feeling of Stress :  Not at all  Social Connections: Socially Isolated (10/17/2021)   Social Connection and Isolation Panel [NHANES]    Frequency of Communication with Friends and Family: Never    Frequency of Social Gatherings with Friends and Family: Once a week    Attends Religious Services: Never    Marine scientist or Organizations: No    Attends Archivist Meetings: Never    Marital Status: Married  Human resources officer Violence: Not At Risk (12/17/2022)   Humiliation, Afraid, Rape, and Kick questionnaire    Fear of Current or Ex-Partner: No    Emotionally Abused: No    Physically Abused: No    Sexually Abused: No      Family History  Problem Relation Age of Onset   Uterine cancer Mother    Diabetes Mother    Cancer Mother    Cancer Maternal Uncle    Bladder Cancer Neg Hx    Kidney disease Neg Hx    Prostate cancer Neg Hx     PHYSICAL EXAM: Vitals:   12/18/22 0900 12/18/22 1303  BP: 131/81 117/73  Pulse: 86 92  Resp: 20 18  Temp: 97.8 F (36.6 C) 98 F (36.7 C)  SpO2: 99% 96%   GENERAL: Chronically ill-appearing white male sitting comfortably in bed HEENT: Negative for arcus senilis or xanthelasma. There is no scleral icterus.  The mucous membranes are pink and moist.   NECK: Supple, No masses. Normal carotid upstrokes without bruits. No masses or thyromegaly.    CHEST: There are no chest wall deformities. There is no chest wall tenderness. Respirations are unlabored.  Lungs-decreased lung sounds bilaterally CARDIAC:  JVP: 8 cm H2O         Normal S1, S2  Normal rate with regular rhythm. No murmurs, rubs or gallops.  Pulses are 2+ and symmetrical in upper and lower extremities.  1+ to 2+ pitting edema.  ABDOMEN: Soft, non-tender, non-distended. There are no masses or hepatomegaly. There are normal bowel sounds.  EXTREMITIES: Warm and well  perfused with no cyanosis, clubbing.  LYMPHATIC: No axillary or supraclavicular lymphadenopathy.  NEUROLOGIC: Alert and oriented x 3.  Right-sided hemiparesis. PSYCH: Patients affect is appropriate, there is no evidence of anxiety or depression.  SKIN: Warm and dry; no lesions or wounds.   DATA REVIEW  ECG: Normal sinus rhythm with poor R wave progression as per my personal interpretation  ECHO: 12/18/2022: LVEF 25 to 30%.  Difficult to assess wall motion abnormalities but appears to have some hypokinesis of the anterior myocardium as per my personal interpretation  ASSESSMENT & PLAN:  Heart failure with reduced ejection fraction Etiology of HF: CT chest from admission demonstrates significant three-vessel coronary calcifications involving the proximal LAD circumflex and RCA.  Patient does not have classic symptoms of angina however he is bedbound. NYHA class / AHA Stage: NYHA III.  Nonambulatory due to stroke.  Shortness of breath has improved significantly Volume status & Diuretics: Hypervolemic.  Continue IV Lasix 60 mg twice daily through today.  Will transition to torsemide tomorrow Vasodilators: Losartan 100 mg daily, amlodipine 10 mg.  Plan to transition to Entresto 91/103 mg twice daily and discontinue amlodipine prior to discharge. Beta-Blocker: Will start carvedilol tomorrow once more euvolemic MRA: Starting spironolactone 25 mg daily due to heart failure and underlying liver cirrhosis Cardiometabolic: Will hold off.  I feel like patient has a high risk of urinary tract infection due to hemiparesis Devices therapies & Valvulopathies: Not currently indicated  2. Liver cirrhosis -  Paracentesis several years ago -LFTs normal this admission. -Hospitalist following.  3.  History of stroke -Nonambulatory at baseline with significant right-sided hemiparesis.  Peter Ryan Advanced Heart Failure Mechanical Circulatory Support

## 2022-12-18 NOTE — Progress Notes (Addendum)
Progress Note    Peter Ryan  [id number] DOB: 1951-03-08  DOA: 12/16/2022 PCP: Jennette Dubin, NP      Brief Narrative:    Medical records reviewed and are as summarized below:  Peter Ryan is a 72 y.o. male medical history significant for stroke with right-sided hemiparesis and slurred speech at baseline, liver cirrhosis, hypertension, diabetes mellitus, dyslipidemia, chronic prostatitis, gout, BPH, bedbound status but can sit in the recliner, history of non-Hodgkin's lymphoma, history of thoracentesis, chronic hypokalemia, obesity, chronic anemia, who presented to the hospital because of shortness of breath and leg swelling. He has been feeling short of breath for about 2 weeks now but this has progressively worsened. He still occasionally has some mild leg swelling at baseline but he has noticed increasing swelling on the legs in the last few days preceding admission.  He also felt weak, tired and lightheaded.   Oxygen saturation was 89% on room air in the emergency department.  He was admitted to the hospital for acute exacerbation of chronic diastolic CHF.  He was treated with IV Lasix.  Potassium was low so he was treated with oral potassium chloride.   Assessment/Plan:   Principal Problem:   Acute on chronic combined systolic and diastolic heart failure (HCC) Active Problems:   Hypokalemia   Type 2 diabetes mellitus (HCC)   History of stroke with residual deficit   Liver cirrhosis (HCC)   Prolonged QT interval   Bilateral pleural effusion    Body mass index is 33.34 kg/m.  (Obesity)   Acute on chronic systolic and diastolic CHF: Continue IV Lasix.  Monitor BMP, daily weight and urine output.  2D echo showed EF estimated at 25 to [id number], grade 2 diastolic dysfunction.  Consulted cardiologist to assist with management.  Of note, 2D echo in September 2021 showed EF estimated at [id number], grade 1 diastolic dysfunction.  Bilateral pleural effusions: Continue  IV Lasix   Chronic hypokalemia: Continue potassium repletion because of ongoing diuresis.   Prolonged QTc interval (504): This is likely due to hypokalemia.  Repeat EKG showed QTc interval of 536.  Repeat EKG tomorrow.  History of stroke: Continue aspirin and Lipitor   Liver cirrhosis: Compensated   Other comorbidities include hypertension, gout, BPH   Diet Order             Diet 2 gram sodium Room service appropriate? Yes; Fluid consistency: Thin  Diet effective now                            Consultants: None  Procedures: None    Medications:    allopurinol  300 mg Oral Daily   amLODipine  10 mg Oral Daily   aspirin EC  325 mg Oral Daily   atorvastatin  80 mg Oral Daily   cholecalciferol  1,000 Units Oral Daily   cloNIDine  0.1 mg Oral BID   cyanocobalamin  1,000 mcg Oral Daily   enoxaparin (LOVENOX) injection  0.5 mg/kg Subcutaneous Q24H   ferrous sulfate  325 mg Oral QODAY   finasteride  5 mg Oral Daily   furosemide  60 mg Intravenous BID   losartan  100 mg Oral Daily   potassium chloride SA  40 mEq Oral TID   sodium chloride flush  3 mL Intravenous Q12H   spironolactone  25 mg Oral Daily   Continuous Infusions:  sodium chloride       Anti-infectives (From admission,  onward)    None              Family Communication/Anticipated D/C date and plan/Code Status   DVT prophylaxis: SCDs Start: 12/16/22 2322     Code Status: DNR  Family Communication: None Disposition Plan: Plan to discharge home in 1 to 2 days   Status is: Inpatient Remains inpatient appropriate because: IV Lasix for CHF exacerbation       Subjective:   Interval events noted.  No shortness of breath or chest pain.  He feels better.  Objective:    Vitals:   12/18/22 0005 12/18/22 0352 12/18/22 0900 12/18/22 1303  BP: 115/70 110/80 131/81 117/73  Pulse: 80 77 86 92  Resp: 18 (!) 21 20 18   Temp: 97.6 F (36.4 C) 97.7 F (36.5 C) 97.8 F (36.6  C) 98 F (36.7 C)  TempSrc:   Oral Oral  SpO2: 99% 99% 99% 96%  Weight:  105.4 kg    Height:       No data found.   Intake/Output Summary (Last 24 hours) at 12/18/2022 1333 Last data filed at 12/18/2022 1307 Gross per 24 hour  Intake 240 ml  Output 2100 ml  Net -1860 ml   Filed Weights   12/16/22 1313 12/18/22 0352  Weight: 117.9 kg 105.4 kg    Exam:  GEN: NAD SKIN: Warm and dry EYES: No pallor or icterus ENT: MMM CV: RRR PULM: CTA B ABD: soft, obese, NT, +BS CNS: AAO x 3, right-sided hemiparesis EXT: Bilateral leg edema is improving.  No erythema or tenderness.      Data Reviewed:   I have personally reviewed following labs and imaging studies:  Labs: Labs show the following:   Basic Metabolic Panel: Recent Labs  Lab 12/16/22 1316 12/17/22 0503 12/18/22 0539  NA 140 141 140  K 3.1* 3.5 3.6  CL 102 103 107  CO2 24 26 26   GLUCOSE 174* 132* 126*  BUN 12 12 18   CREATININE 0.72 0.82 0.84  CALCIUM 9.2 8.6* 8.3*  MG  --  1.8 1.9   GFR Estimated Creatinine Clearance: 101 mL/min (by C-G formula based on SCr of 0.84 mg/dL). Liver Function Tests: No results for input(s): "AST", "ALT", "ALKPHOS", "BILITOT", "PROT", "ALBUMIN" in the last 168 hours. No results for input(s): "LIPASE", "AMYLASE" in the last 168 hours. No results for input(s): "AMMONIA" in the last 168 hours. Coagulation profile No results for input(s): "INR", "PROTIME" in the last 168 hours.  CBC: Recent Labs  Lab 12/16/22 1316 12/17/22 0503  WBC 10.8* 12.1*  NEUTROABS  --  8.6*  HGB 12.7* 12.1*  HCT 44.0 41.2  MCV 75.0* 75.2*  PLT 363 345   Cardiac Enzymes: No results for input(s): "CKTOTAL", "CKMB", "CKMBINDEX", "TROPONINI" in the last 168 hours. BNP (last 3 results) No results for input(s): "PROBNP" in the last 8760 hours. CBG: No results for input(s): "GLUCAP" in the last 168 hours. D-Dimer: No results for input(s): "DDIMER" in the last 72 hours. Hgb A1c: No results for  input(s): "HGBA1C" in the last 72 hours. Lipid Profile: No results for input(s): "CHOL", "HDL", "LDLCALC", "TRIG", "CHOLHDL", "LDLDIRECT" in the last 72 hours. Thyroid function studies: No results for input(s): "TSH", "T4TOTAL", "T3FREE", "THYROIDAB" in the last 72 hours.  Invalid input(s): "FREET3" Anemia work up: No results for input(s): "VITAMINB12", "FOLATE", "FERRITIN", "TIBC", "IRON", "RETICCTPCT" in the last 72 hours. Sepsis Labs: Recent Labs  Lab 12/16/22 1316 12/17/22 0503  PROCALCITON <0.10  --   WBC  10.8* 12.1*    Microbiology Recent Results (from the past 240 hour(s))  Resp Panel by RT-PCR (Flu A&B, Covid) Anterior Nasal Swab     Status: None   Collection Time: 12/17/22 11:52 PM   Specimen: Anterior Nasal Swab  Result Value Ref Range Status   SARS Coronavirus 2 by RT PCR NEGATIVE NEGATIVE Final    Comment: (NOTE) SARS-CoV-2 target nucleic acids are NOT DETECTED.  The SARS-CoV-2 RNA is generally detectable in upper respiratory specimens during the acute phase of infection. The lowest concentration of SARS-CoV-2 viral copies this assay can detect is 138 copies/mL. A negative result does not preclude SARS-Cov-2 infection and should not be used as the sole basis for treatment or other patient management decisions. A negative result may occur with  improper specimen collection/handling, submission of specimen other than nasopharyngeal swab, presence of viral mutation(s) within the areas targeted by this assay, and inadequate number of viral copies(<138 copies/mL). A negative result must be combined with clinical observations, patient history, and epidemiological information. The expected result is Negative.  Fact Sheet for Patients:  EntrepreneurPulse.com.au  Fact Sheet for Healthcare Providers:  IncredibleEmployment.be  This test is no t yet approved or cleared by the [place] FDA and  has been authorized for detection  and/or diagnosis of SARS-CoV-2 by FDA under an Emergency Use Authorization (EUA). This EUA will remain  in effect (meaning this test can be used) for the duration of the COVID-19 declaration under Section 564(b)(1) of the Act, 21 U.S.C.section 360bbb-3(b)(1), unless the authorization is terminated  or revoked sooner.       Influenza A by PCR NEGATIVE NEGATIVE Final   Influenza B by PCR NEGATIVE NEGATIVE Final    Comment: (NOTE) The Xpert Xpress SARS-CoV-2/FLU/RSV plus assay is intended as an aid in the diagnosis of influenza from Nasopharyngeal swab specimens and should not be used as a sole basis for treatment. Nasal washings and aspirates are unacceptable for Xpert Xpress SARS-CoV-2/FLU/RSV testing.  Fact Sheet for Patients: EntrepreneurPulse.com.au  Fact Sheet for Healthcare Providers: IncredibleEmployment.be  This test is not yet approved or cleared by the [place] FDA and has been authorized for detection and/or diagnosis of SARS-CoV-2 by FDA under an Emergency Use Authorization (EUA). This EUA will remain in effect (meaning this test can be used) for the duration of the COVID-19 declaration under Section 564(b)(1) of the Act, 21 U.S.C. section 360bbb-3(b)(1), unless the authorization is terminated or revoked.  Performed at [facility], [address] [place], [place] [address]     Procedures and diagnostic studies:  ECHOCARDIOGRAM COMPLETE  Result Date: 12/18/2022    ECHOCARDIOGRAM REPORT   Patient Name:   Nikoloz Dauphinais Date of Exam: 12/17/2022 Medical Rec #:  [id number]       Height:       70.0 in Accession #:    [id number]      Weight:       260.0 lb Date of Birth:  [birth year]       BSA:          2.334 m Patient Age:    60 years        BP:           148/96 mmHg Patient Gender: M               HR:           91 bpm. Exam Location:  [facility] Procedure: 2D Echo, Cardiac Doppler, Color Doppler and Intracardiac  Opacification Agent Indications:     [id number] Acute Diastolic CHF  History:         Patient has prior history of Echocardiogram examinations, most                  recent 06/05/2020. Risk Factors:Hypertension and Dyslipidemia.                  Cirrhosis of liver.  Sonographer:     Cresenciano Lick RDCS Referring Phys:  [id number] Jennye Boroughs Diagnosing Phys: Nelva Bush MD IMPRESSIONS  1. Left ventricular ejection fraction, by estimation, is 25 to 30%. The left ventricle has severely decreased function. Left ventricular endocardial border not optimally defined to evaluate regional wall motion. There is mild left ventricular hypertrophy. Left ventricular diastolic parameters are consistent with Grade II diastolic dysfunction (pseudonormalization).  2. Right ventricular systolic function was not well visualized. The right ventricular size is normal. Tricuspid regurgitation signal is inadequate for assessing PA pressure.  3. The mitral valve is abnormal. Trivial mitral valve regurgitation. No evidence of mitral stenosis.  4. The aortic valve has an indeterminant number of cusps. There is mild calcification of the aortic valve. There is mild thickening of the aortic valve. Aortic valve regurgitation is not visualized. Aortic valve sclerosis is present, with no evidence of  aortic valve stenosis.  5. Aortic dilatation noted. There is borderline dilatation of the aortic root, measuring 38 mm. There is mild dilatation of the ascending aorta, measuring 40 mm.  6. The inferior vena cava is normal in size with <50% respiratory variability, suggesting right atrial pressure of 8 mmHg. FINDINGS  Left Ventricle: Left ventricular ejection fraction, by estimation, is 25 to 30%. The left ventricle has severely decreased function. Left ventricular endocardial border not optimally defined to evaluate regional wall motion. Definity contrast agent was given IV to delineate the left ventricular endocardial borders. The left  ventricular internal cavity size was normal in size. There is mild left ventricular hypertrophy. Left ventricular diastolic parameters are consistent with Grade II diastolic dysfunction (pseudonormalization). Right Ventricle: The right ventricular size is normal. No increase in right ventricular wall thickness. Right ventricular systolic function was not well visualized. Tricuspid regurgitation signal is inadequate for assessing PA pressure. Left Atrium: Left atrial size was normal in size. Right Atrium: Right atrial size was normal in size. Pericardium: The pericardium was not well visualized. Mitral Valve: The mitral valve is abnormal. There is mild calcification of the mitral valve leaflet(s). Trivial mitral valve regurgitation. No evidence of mitral valve stenosis. Tricuspid Valve: The tricuspid valve is not well visualized. Tricuspid valve regurgitation is not demonstrated. Aortic Valve: The aortic valve has an indeterminant number of cusps. There is mild calcification of the aortic valve. There is mild thickening of the aortic valve. Aortic valve regurgitation is not visualized. Aortic valve sclerosis is present, with no evidence of aortic valve stenosis. Pulmonic Valve: The pulmonic valve was not well visualized. Pulmonic valve regurgitation is not visualized. No evidence of pulmonic stenosis. Aorta: Aortic dilatation noted. There is borderline dilatation of the aortic root, measuring 38 mm. There is mild dilatation of the ascending aorta, measuring 40 mm. Pulmonary Artery: The pulmonary artery is not well seen. Venous: The inferior vena cava is normal in size with less than 50% respiratory variability, suggesting right atrial pressure of 8 mmHg. IAS/Shunts: The interatrial septum was not well visualized.  LEFT VENTRICLE PLAX 2D LVIDd:         5.30 cm   Diastology LVIDs:  4.50 cm   LV e' medial:    5.50 cm/s LV PW:         0.80 cm   LV E/e' medial:  14.4 LV IVS:        0.70 cm   LV e' lateral:   6.74  cm/s LVOT diam:     2.10 cm   LV E/e' lateral: 11.8 LV SV:         35 LV SV Index:   15 LVOT Area:     3.46 cm  RIGHT VENTRICLE          IVC RV Basal diam:  3.20 cm  IVC diam: 1.60 cm LEFT ATRIUM             Index        RIGHT ATRIUM           Index LA diam:        4.40 cm 1.89 cm/m   RA Area:     12.70 cm LA Vol (A2C):   62.1 ml 26.61 ml/m  RA Volume:   31.20 ml  13.37 ml/m LA Vol (A4C):   60.0 ml 25.71 ml/m LA Biplane Vol: 62.9 ml 26.95 ml/m  AORTIC VALVE LVOT Vmax:   58.10 cm/s LVOT Vmean:  37.833 cm/s LVOT VTI:    0.100 m  AORTA Ao Root diam: 3.80 cm Ao Asc diam:  4.00 cm MITRAL VALVE MV Area (PHT): 5.07 cm    SHUNTS MV Decel Time: 150 msec    Systemic VTI:  0.10 m MV E velocity: 79.40 cm/s  Systemic Diam: 2.10 cm MV A velocity: 48.50 cm/s MV E/A ratio:  1.64 Harrell Gave End MD Electronically signed by Nelva Bush MD Signature Date/Time: 12/18/2022/7:01:09 AM    Final    CT Angio Chest PE W and/or Wo Contrast  Result Date: 12/16/2022 CLINICAL DATA:  SOB EXAM: CT ANGIOGRAPHY CHEST WITH CONTRAST TECHNIQUE: Multidetector CT imaging of the chest was performed using the standard protocol during bolus administration of intravenous contrast. Multiplanar CT image reconstructions and MIPs were obtained to evaluate the vascular anatomy. RADIATION DOSE REDUCTION: This exam was performed according to the departmental dose-optimization program which includes automated exposure control, adjustment of the mA and/or kV according to patient size and/or use of iterative reconstruction technique. CONTRAST:  75 mL OMNIPAQUE IOHEXOL 350 MG/ML SOLN COMPARISON:  11/12/2016 FINDINGS: Cardiovascular: Satisfactory opacification of the pulmonary arteries to the segmental level. No evidence of pulmonary embolism. Normal heart size. No pericardial effusion. Atheromatous calcifications noted of coronary arteries. Reflux of contrast into the IVC and hepatic veins consistent with tricuspid insufficiency or right heart strain.  Mediastinum/Nodes: No enlarged mediastinal, hilar, or axillary right paratracheal 1.5 cm node is nonspecific. Right prevascular adenopathy present previously is stable. There is an increase in the number of prominent mediastinal nodes. An AP window node measures 1.1 cm. Thyroid nodularity identified which can be correlated with outpatient ultrasound. Trachea and esophagus unremarkable. Lungs/Pleura: Moderate bilateral pleural effusions. Compressive atelectasis above the effusions. Ground-glass opacity consistent with pneumonitis or pulmonary edema in the aerated lungs. No filling defects in pulmonary arteries to indicate PE. No pneumothorax. Upper Abdomen: No acute abnormality. Musculoskeletal: Thoracic degenerative changes are noted. Review of the MIP images confirms the above findings. IMPRESSION: 1. No evidence of pulmonary embolism. 2. Bilateral pleural effusions. 3. Bibasilar consolidation or volume loss. 4. Ground-glass opacity consistent with pulmonary edema or pneumonitis. 5. Nonspecific mediastinal adenopathy. Electronically Signed   By: Samuel Germany.D.  On: 12/16/2022 16:41   DG Chest 2 View  Result Date: 12/16/2022 CLINICAL DATA:  Shortness of breath EXAM: CHEST - 2 VIEW COMPARISON:  04/18/2017 FINDINGS: Cardiomegaly. Pulmonary vascular congestion. Small left and trace right pleural effusions. Hazy left basilar airspace opacity. No pneumothorax. IMPRESSION: Cardiomegaly with pulmonary vascular congestion and left greater than right pleural effusions. Electronically Signed   By: Davina Poke D.O.   On: 12/16/2022 14:50               LOS: 2 days   Ladasha Schnackenberg  [facility]   Pager on [email or web address]. If 7PM-7AM, please contact night-coverage at [email or web address]     12/18/2022, 1:33 PM

## 2022-12-18 NOTE — Consult Note (Addendum)
  Heart Failure Nurse Navigator Note  HFrEF 25 to 30%.  Mild left ventricular hypertrophy.  Grade 2 diastolic dysfunction.  Mild calcification of the aortic valve.  He presented to the emergency room with complaints of worsening shortness of breath and leg swelling.  He had noted it for approximately 2 weeks and progressively worsened.  BNP was 565.  Chest CT revealed moderate pleural effusions and pulmonary edema.  Comorbidities:  CVA with right hemiparesis Liver cirrhosis Hypertension Diabetes Hyperlipidemia Gout BPH Obesity Chronic anemia  Medications:  Amlodipine 10 mg daily Aspirin 325 mg daily Atorvastatin 80 mg daily Clonidine 0.1 mg 2 times a day Furosemide 60 mg 2 times daily IV Losartan 100 mg daily Potassium chloride 40 mEq 3 times a day  Labs:  Sodium 140, potassium 3.6, chloride 107, CO2 26, BUN 18, creatinine 0.84, estimated GFR greater than 60, magnesium 1.9. Weight is 105.4 kg Intake 240 mL Output 1300 mL  Initial meeting with patient, he was lying in bed in no acute distress.  He states that his wife is not present today due to her not feeling well.  He states that he lives at home with his wife and" is pretty much bedbound does not get up in a chair.".  He states transporting to appointments is a problem as his wife cannot do it.  Discussed heart failure and what it means.  During the interview Dr. Daniel Nones came into the room to talk with the patient and explained to him the function of his heart.  Also talked with him about the results of his chest CT with the calcification and thinking that the decline in his heart function could be caused by blockages in his vessels that feed his heart.  Patient voices understanding.   With the decline in his heart function explained that it is important that he watch his sodium intake with no more than 2000 mg a day and to limit his fluid intake to no more than 64 ounces daily.  He states that they are not ones  for  eating  vegetables either canned or frozen.  Patient states with his wife not being present he asked that I call and speak with her.  Patient's nurse Adam also aware.  Call to wife and spoke with [facility] directly.  Explained to her the above with what the conversation had been with Dr. Daniel Nones.  She voices understanding and would like to speak with Dr. Daniel Nones tomorrow, she said she would be available at the hospital tomorrow anytime after 10 AM.  Patient was given the living with heart failure teaching booklet, zone magnet, info on low sodium and heart failure along with weight chart.  He has no further questions.  Pricilla Riffle RN [facility]

## 2022-12-19 ENCOUNTER — Other Ambulatory Visit (HOSPITAL_COMMUNITY): Payer: Self-pay

## 2022-12-19 DIAGNOSIS — R0602 Shortness of breath: Secondary | ICD-10-CM

## 2022-12-19 DIAGNOSIS — I5043 Acute on chronic combined systolic (congestive) and diastolic (congestive) heart failure: Secondary | ICD-10-CM | POA: Diagnosis not present

## 2022-12-19 LAB — BASIC METABOLIC PANEL
Anion gap: 9 (ref 5–15)
BUN: 24 mg/dL — ABNORMAL HIGH (ref 8–23)
CO2: 25 mmol/L (ref 22–32)
Calcium: 8.8 mg/dL — ABNORMAL LOW (ref 8.9–10.3)
Chloride: 107 mmol/L (ref 98–111)
Creatinine, Ser: 0.94 mg/dL (ref 0.61–1.24)
GFR, Estimated: >60 mL/min (ref >60)
Glucose, Bld: 121 mg/dL — ABNORMAL HIGH (ref 70–99)
Potassium: 4.3 mmol/L (ref 3.5–5.1)
Sodium: 141 mmol/L (ref 135–145)

## 2022-12-19 LAB — MAGNESIUM: Magnesium: 1.9 mg/dL (ref 1.7–2.4)

## 2022-12-19 MED ORDER — ENOXAPARIN SODIUM 60 MG/0.6ML IJ SOSY
0.5000 mg/kg | PREFILLED_SYRINGE | INTRAMUSCULAR | Status: DC
  Administered 2022-12-20: 52.5 mg via SUBCUTANEOUS
  Filled 2022-12-19: qty 0.6

## 2022-12-19 MED ORDER — FUROSEMIDE 10 MG/ML IJ SOLN
60.0000 mg | Freq: Once | INTRAMUSCULAR | Status: AC
  Administered 2022-12-19: 60 mg via INTRAVENOUS
  Filled 2022-12-19: qty 6

## 2022-12-19 MED ORDER — CARVEDILOL 3.125 MG PO TABS
3.1250 mg | ORAL_TABLET | Freq: Two times a day (BID) | ORAL | Status: DC
  Administered 2022-12-19 – 2022-12-20 (×2): 3.125 mg via ORAL
  Filled 2022-12-19 (×2): qty 1

## 2022-12-19 MED ORDER — SACUBITRIL-VALSARTAN 24-26 MG PO TABS
1.0000 | ORAL_TABLET | Freq: Two times a day (BID) | ORAL | Status: DC
  Administered 2022-12-19 – 2022-12-21 (×5): 1 via ORAL
  Filled 2022-12-19 (×5): qty 1

## 2022-12-19 NOTE — Progress Notes (Signed)
Progress Note    Peter Ryan  [id number] DOB: 1951-02-08  DOA: 12/16/2022 PCP: Jennette Dubin, NP      Brief Narrative:    Medical records reviewed and are as summarized below:  Peter Ryan is a 72 y.o. male medical history significant for stroke with right-sided hemiparesis and slurred speech at baseline, liver cirrhosis, hypertension, diabetes mellitus, dyslipidemia, chronic prostatitis, gout, BPH, bedbound status but can sit in the recliner, history of non-Hodgkin's lymphoma, history of thoracentesis, chronic hypokalemia, obesity, chronic anemia, who presented to the hospital because of shortness of breath and leg swelling. He has been feeling short of breath for about 2 weeks now but this has progressively worsened. He still occasionally has some mild leg swelling at baseline but he has noticed increasing swelling on the legs in the last few days preceding admission.  He also felt weak, tired and lightheaded.   Oxygen saturation was 89% on room air in the emergency department.  He was admitted to the hospital for acute exacerbation of chronic diastolic CHF.  He was treated with IV Lasix.  Potassium was low so he was treated with oral potassium chloride.   Assessment/Plan:   Principal Problem:   Acute on chronic combined systolic and diastolic heart failure (HCC) Active Problems:   Hypokalemia   Type 2 diabetes mellitus (HCC)   History of stroke with residual deficit   Liver cirrhosis (HCC)   Prolonged QT interval   Bilateral pleural effusion    Body mass index is 32.77 kg/m.  (Obesity)   Acute on chronic systolic and diastolic CHF: Discontinue IV Lasix per cardiologist.  He has been started on carvedilol and Entresto.  Continue spironolactone.  Monitor BMP, daily weight and urine output.  2D echo showed EF estimated at 25 to [id number], grade 2 diastolic dysfunction.   Of note, 2D echo in September 2021 showed EF estimated at [id number], grade 1 diastolic  dysfunction.  Bilateral pleural effusions: He is asymptomatic.  He is not hypoxic.  IV Lasix has been discontinued.   Chronic hypokalemia: Discontinue potassium chloride supplement because potassium has normalized and he has been started on spironolactone.  Replete potassium as needed.   Prolonged QTc interval (504): This is likely due to hypokalemia.  Repeat EKG showed QTc interval of 536.  Repeat EKG today  History of stroke: Continue aspirin and Lipitor   Liver cirrhosis: Compensated   Other comorbidities include hypertension, gout, BPH   Plan discussed with patient and his wife at the bedside.  They had a lot of questions about cardiac catheterization.  I answered their questions to the best of my ability.   Diet Order             Diet 2 gram sodium Room service appropriate? Yes; Fluid consistency: Thin  Diet effective now                            Consultants: Cardiologist  Procedures: None    Medications:    allopurinol  300 mg Oral Daily   amLODipine  10 mg Oral Daily   aspirin EC  325 mg Oral Daily   atorvastatin  80 mg Oral Daily   carvedilol  3.125 mg Oral BID WC   cholecalciferol  1,000 Units Oral Daily   cyanocobalamin  1,000 mcg Oral Daily   enoxaparin (LOVENOX) injection  0.5 mg/kg Subcutaneous Q24H   ferrous sulfate  325 mg Oral QODAY  finasteride  5 mg Oral Daily   potassium chloride SA  40 mEq Oral TID   sacubitril-valsartan  1 tablet Oral BID   sodium chloride flush  3 mL Intravenous Q12H   spironolactone  25 mg Oral Daily   Continuous Infusions:  sodium chloride       Anti-infectives (From admission, onward)    None              Family Communication/Anticipated D/C date and plan/Code Status   DVT prophylaxis: SCDs Start: 12/16/22 2322     Code Status: DNR  Family Communication: Plan discussed with his wife at bedside Disposition Plan: Plan to discharge home in 1 to 2 days   Status is:  Inpatient Remains inpatient appropriate because: CHF exacerbation, may need cath       Subjective:   Interval events noted.  No shortness of breath or chest pain.  Leg swelling has improved.  His wife was at the bedside.  Objective:    Vitals:   12/19/22 0330 12/19/22 0403 12/19/22 0803 12/19/22 1147  BP: 114/74  120/78 118/69  Pulse: 80  84 90  Resp: 16  16 16   Temp: 97.6 F (36.4 C)  98.1 F (36.7 C) (!) 97.5 F (36.4 C)  TempSrc: Oral     SpO2: 99%  97% 97%  Weight:  103.6 kg    Height:       No data found.   Intake/Output Summary (Last 24 hours) at 12/19/2022 1322 Last data filed at 12/19/2022 1053 Gross per 24 hour  Intake 480 ml  Output 1000 ml  Net -520 ml   Filed Weights   12/16/22 1313 12/18/22 0352 12/19/22 0403  Weight: 117.9 kg 105.4 kg 103.6 kg    Exam:    GEN: NAD SKIN: Warm and dry EYES: EOMI ENT: MMM CV: RRR PULM: CTA B ABD: soft, obese, NT, +BS CNS: AAO x 3, right-sided hemiparesis EXT: No edema or tenderness     Data Reviewed:   I have personally reviewed following labs and imaging studies:  Labs: Labs show the following:   Basic Metabolic Panel: Recent Labs  Lab 12/16/22 1316 12/17/22 0503 12/18/22 0539 12/19/22 0631  NA 140 141 140 141  K 3.1* 3.5 3.6 4.3  CL 102 103 107 107  CO2 24 26 26 25   GLUCOSE 174* 132* 126* 121*  BUN 12 12 18  24*  CREATININE 0.72 0.82 0.84 0.94  CALCIUM 9.2 8.6* 8.3* 8.8*  MG  --  1.8 1.9 1.9   GFR Estimated Creatinine Clearance: 89.4 mL/min (by C-G formula based on SCr of 0.94 mg/dL). Liver Function Tests: No results for input(s): "AST", "ALT", "ALKPHOS", "BILITOT", "PROT", "ALBUMIN" in the last 168 hours. No results for input(s): "LIPASE", "AMYLASE" in the last 168 hours. No results for input(s): "AMMONIA" in the last 168 hours. Coagulation profile No results for input(s): "INR", "PROTIME" in the last 168 hours.  CBC: Recent Labs  Lab 12/16/22 1316 12/17/22 0503  WBC 10.8*  12.1*  NEUTROABS  --  8.6*  HGB 12.7* 12.1*  HCT 44.0 41.2  MCV 75.0* 75.2*  PLT 363 345   Cardiac Enzymes: No results for input(s): "CKTOTAL", "CKMB", "CKMBINDEX", "TROPONINI" in the last 168 hours. BNP (last 3 results) No results for input(s): "PROBNP" in the last 8760 hours. CBG: No results for input(s): "GLUCAP" in the last 168 hours. D-Dimer: No results for input(s): "DDIMER" in the last 72 hours. Hgb A1c: No results for input(s): "HGBA1C" in the  last 72 hours. Lipid Profile: No results for input(s): "CHOL", "HDL", "LDLCALC", "TRIG", "CHOLHDL", "LDLDIRECT" in the last 72 hours. Thyroid function studies: No results for input(s): "TSH", "T4TOTAL", "T3FREE", "THYROIDAB" in the last 72 hours.  Invalid input(s): "FREET3" Anemia work up: No results for input(s): "VITAMINB12", "FOLATE", "FERRITIN", "TIBC", "IRON", "RETICCTPCT" in the last 72 hours. Sepsis Labs: Recent Labs  Lab 12/16/22 1316 12/17/22 0503  PROCALCITON <0.10  --   WBC 10.8* 12.1*    Microbiology Recent Results (from the past 240 hour(s))  Resp Panel by RT-PCR (Flu A&B, Covid) Anterior Nasal Swab     Status: None   Collection Time: 12/17/22 11:52 PM   Specimen: Anterior Nasal Swab  Result Value Ref Range Status   SARS Coronavirus 2 by RT PCR NEGATIVE NEGATIVE Final    Comment: (NOTE) SARS-CoV-2 target nucleic acids are NOT DETECTED.  The SARS-CoV-2 RNA is generally detectable in upper respiratory specimens during the acute phase of infection. The lowest concentration of SARS-CoV-2 viral copies this assay can detect is 138 copies/mL. A negative result does not preclude SARS-Cov-2 infection and should not be used as the sole basis for treatment or other patient management decisions. A negative result may occur with  improper specimen collection/handling, submission of specimen other than nasopharyngeal swab, presence of viral mutation(s) within the areas targeted by this assay, and inadequate number of  viral copies(<138 copies/mL). A negative result must be combined with clinical observations, patient history, and epidemiological information. The expected result is Negative.  Fact Sheet for Patients:  EntrepreneurPulse.com.au  Fact Sheet for Healthcare Providers:  IncredibleEmployment.be  This test is no t yet approved or cleared by the [place] FDA and  has been authorized for detection and/or diagnosis of SARS-CoV-2 by FDA under an Emergency Use Authorization (EUA). This EUA will remain  in effect (meaning this test can be used) for the duration of the COVID-19 declaration under Section 564(b)(1) of the Act, 21 U.S.C.section 360bbb-3(b)(1), unless the authorization is terminated  or revoked sooner.       Influenza A by PCR NEGATIVE NEGATIVE Final   Influenza B by PCR NEGATIVE NEGATIVE Final    Comment: (NOTE) The Xpert Xpress SARS-CoV-2/FLU/RSV plus assay is intended as an aid in the diagnosis of influenza from Nasopharyngeal swab specimens and should not be used as a sole basis for treatment. Nasal washings and aspirates are unacceptable for Xpert Xpress SARS-CoV-2/FLU/RSV testing.  Fact Sheet for Patients: EntrepreneurPulse.com.au  Fact Sheet for Healthcare Providers: IncredibleEmployment.be  This test is not yet approved or cleared by the [place] FDA and has been authorized for detection and/or diagnosis of SARS-CoV-2 by FDA under an Emergency Use Authorization (EUA). This EUA will remain in effect (meaning this test can be used) for the duration of the COVID-19 declaration under Section 564(b)(1) of the Act, 21 U.S.C. section 360bbb-3(b)(1), unless the authorization is terminated or revoked.  Performed at [facility], [address], [place], [place] [address]     Procedures and diagnostic studies:  ECHOCARDIOGRAM COMPLETE  Result Date: 12/18/2022    ECHOCARDIOGRAM  REPORT   Patient Name:   Nolen Senk Date of Exam: 12/17/2022 Medical Rec #:  [id number]       Height:       70.0 in Accession #:    [id number]      Weight:       260.0 lb Date of Birth:  Feb 22, 1951       BSA:  2.334 m Patient Age:    20 years        BP:           148/96 mmHg Patient Gender: M               HR:           91 bpm. Exam Location:  [facility] Procedure: 2D Echo, Cardiac Doppler, Color Doppler and Intracardiac            Opacification Agent Indications:     [id number] Acute Diastolic CHF  History:         Patient has prior history of Echocardiogram examinations, most                  recent 06/05/2020. Risk Factors:Hypertension and Dyslipidemia.                  Cirrhosis of liver.  Sonographer:     Cresenciano Lick RDCS Referring Phys:  [id number] Jennye Boroughs Diagnosing Phys: Nelva Bush MD IMPRESSIONS  1. Left ventricular ejection fraction, by estimation, is 25 to 30%. The left ventricle has severely decreased function. Left ventricular endocardial border not optimally defined to evaluate regional wall motion. There is mild left ventricular hypertrophy. Left ventricular diastolic parameters are consistent with Grade II diastolic dysfunction (pseudonormalization).  2. Right ventricular systolic function was not well visualized. The right ventricular size is normal. Tricuspid regurgitation signal is inadequate for assessing PA pressure.  3. The mitral valve is abnormal. Trivial mitral valve regurgitation. No evidence of mitral stenosis.  4. The aortic valve has an indeterminant number of cusps. There is mild calcification of the aortic valve. There is mild thickening of the aortic valve. Aortic valve regurgitation is not visualized. Aortic valve sclerosis is present, with no evidence of  aortic valve stenosis.  5. Aortic dilatation noted. There is borderline dilatation of the aortic root, measuring 38 mm. There is mild dilatation of the ascending aorta, measuring 40 mm.  6. The inferior vena cava  is normal in size with <50% respiratory variability, suggesting right atrial pressure of 8 mmHg. FINDINGS  Left Ventricle: Left ventricular ejection fraction, by estimation, is 25 to 30%. The left ventricle has severely decreased function. Left ventricular endocardial border not optimally defined to evaluate regional wall motion. Definity contrast agent was given IV to delineate the left ventricular endocardial borders. The left ventricular internal cavity size was normal in size. There is mild left ventricular hypertrophy. Left ventricular diastolic parameters are consistent with Grade II diastolic dysfunction (pseudonormalization). Right Ventricle: The right ventricular size is normal. No increase in right ventricular wall thickness. Right ventricular systolic function was not well visualized. Tricuspid regurgitation signal is inadequate for assessing PA pressure. Left Atrium: Left atrial size was normal in size. Right Atrium: Right atrial size was normal in size. Pericardium: The pericardium was not well visualized. Mitral Valve: The mitral valve is abnormal. There is mild calcification of the mitral valve leaflet(s). Trivial mitral valve regurgitation. No evidence of mitral valve stenosis. Tricuspid Valve: The tricuspid valve is not well visualized. Tricuspid valve regurgitation is not demonstrated. Aortic Valve: The aortic valve has an indeterminant number of cusps. There is mild calcification of the aortic valve. There is mild thickening of the aortic valve. Aortic valve regurgitation is not visualized. Aortic valve sclerosis is present, with no evidence of aortic valve stenosis. Pulmonic Valve: The pulmonic valve was not well visualized. Pulmonic valve regurgitation is not visualized. No evidence of pulmonic stenosis. Aorta: Aortic  dilatation noted. There is borderline dilatation of the aortic root, measuring 38 mm. There is mild dilatation of the ascending aorta, measuring 40 mm. Pulmonary Artery: The  pulmonary artery is not well seen. Venous: The inferior vena cava is normal in size with less than 50% respiratory variability, suggesting right atrial pressure of 8 mmHg. IAS/Shunts: The interatrial septum was not well visualized.  LEFT VENTRICLE PLAX 2D LVIDd:         5.30 cm   Diastology LVIDs:         4.50 cm   LV e' medial:    5.50 cm/s LV PW:         0.80 cm   LV E/e' medial:  14.4 LV IVS:        0.70 cm   LV e' lateral:   6.74 cm/s LVOT diam:     2.10 cm   LV E/e' lateral: 11.8 LV SV:         35 LV SV Index:   15 LVOT Area:     3.46 cm  RIGHT VENTRICLE          IVC RV Basal diam:  3.20 cm  IVC diam: 1.60 cm LEFT ATRIUM             Index        RIGHT ATRIUM           Index LA diam:        4.40 cm 1.89 cm/m   RA Area:     12.70 cm LA Vol (A2C):   62.1 ml 26.61 ml/m  RA Volume:   31.20 ml  13.37 ml/m LA Vol (A4C):   60.0 ml 25.71 ml/m LA Biplane Vol: 62.9 ml 26.95 ml/m  AORTIC VALVE LVOT Vmax:   58.10 cm/s LVOT Vmean:  37.833 cm/s LVOT VTI:    0.100 m  AORTA Ao Root diam: 3.80 cm Ao Asc diam:  4.00 cm MITRAL VALVE MV Area (PHT): 5.07 cm    SHUNTS MV Decel Time: 150 msec    Systemic VTI:  0.10 m MV E velocity: 79.40 cm/s  Systemic Diam: 2.10 cm MV A velocity: 48.50 cm/s MV E/A ratio:  1.64 Harrell Gave End MD Electronically signed by Nelva Bush MD Signature Date/Time: 12/18/2022/7:01:09 AM    Final                LOS: 3 days   Dierks Wach  [facility]   Pager on [email or web address]. If 7PM-7AM, please contact night-coverage at [email or web address]     12/19/2022, 1:22 PM

## 2022-12-19 NOTE — Care Management Important Message (Signed)
Important Message  Patient Details  Name: Peter Ryan MRN: [id number] Date of Birth: June 17, 1951   Medicare Important Message Given:  Yes     [name] 12/19/2022, 12:27 PM

## 2022-12-19 NOTE — Progress Notes (Signed)
ADVANCED HEART FAILURE CLINIC NOTE  Referring Physician: No ref. provider found  Primary Care: Peter Dubin, NP   HPI: Peter Ryan is a 72 y.o. male with history of stroke and significant right-sided hemiparesis, liver cirrhosis, hypertension, type 2 diabetes, dyslipidemia, mild BPH, history of non-Hodgkin's lymphoma, obesity presenting to the hospital with a several day history of progressively worsening shortness of breath.  According to Peter Ryan at baseline he is bedbound and unable to care for himself.  He is primary caregiver is his wife who manages his medications and helps take care of him.  He has been nonambulatory since prior stroke.  He reports being in his usual state of health several days ago when he started to become increasingly short of breath with rapid progression of lower extremity swelling.  Due to progression of symptoms he came to the [facility] emergency department where he was found to be hypoxic to 89% on room air and severely volume overloaded.  Patient was given IV Lasix with significant improvement in symptoms.  In addition he had a CT chest that was negative for PE and admitted to the hospitalist team.  Since that time he has had an echocardiogram that demonstrated severely reduced EF.  PHYSICAL EXAM: Vitals:   12/19/22 0330 12/19/22 0803  BP: 114/74 120/78  Pulse: 80 84  Resp: 16 16  Temp: 97.6 F (36.4 C) 98.1 F (36.7 C)  SpO2: 99% 97%   GENERAL: Chronically ill-appearing white male sitting comfortably in bed HEENT: Negative for arcus senilis or xanthelasma. There is no scleral icterus.  The mucous membranes are pink and moist.   NECK: Supple, No masses. Normal carotid upstrokes without bruits. No masses or thyromegaly.    CHEST: There are no chest wall deformities. There is no chest wall tenderness. Respirations are unlabored.  Lungs-decreased lung sounds bilaterally CARDIAC:  JVP: 8-9 cm H2O         Normal S1, S2  Normal rate with regular  rhythm. No murmurs, rubs or gallops.  Pulses are 2+ and symmetrical in upper and lower extremities.  1+ pitting edmea.  ABDOMEN: Soft, non-tender, non-distended. There are no masses or hepatomegaly. There are normal bowel sounds.  EXTREMITIES: Warm and well perfused with no cyanosis, clubbing.  LYMPHATIC: No axillary or supraclavicular lymphadenopathy.  NEUROLOGIC: Alert and oriented x 3.  Right-sided hemiparesis. PSYCH: Patients affect is appropriate, there is no evidence of anxiety or depression.  SKIN: Warm and dry; no lesions or wounds.   DATA REVIEW  ECG: Normal sinus rhythm with poor R wave progression as per my personal interpretation  ECHO: 12/18/2022: LVEF 25 to 30%.  Difficult to assess wall motion abnormalities but appears to have some hypokinesis of the anterior myocardium as per my personal interpretation  ASSESSMENT & PLAN:  Heart failure with reduced ejection fraction Etiology of HF: CT chest from admission demonstrates significant three-vessel coronary calcifications involving the proximal LAD circumflex and RCA.  Patient does not have classic symptoms of angina however he is bedbound. Patient wishes to discuss [name] with his wife today.  NYHA class / AHA Stage: NYHA III.  Nonambulatory due to stroke.  Shortness of breath has improved significantly Volume status & Diuretics: Hypervolemic.  Appears euvolemic on exam, will hold off on further diuresis.  Vasodilators: D/C losartan, D/C clonidine. Start coreg 3.125mg  & Entresto 24/26mg  BID; uptitrate Entresto to 97/103 over the next 1-2 days.  Beta-Blocker: start coreg 3.125mg  BID.  MRA: continue spironolactone 25 mg daily due to heart  failure and underlying liver cirrhosis Cardiometabolic: Will hold off.  I feel like patient has a high risk of urinary tract infection due to hemiparesis Devices therapies & Valvulopathies: Not currently indicated  2. Liver cirrhosis -Paracentesis several years ago -LFTs normal this  admission. -Hospitalist following.  3.  History of stroke -Nonambulatory at baseline with significant right-sided hemiparesis.  Peter Ryan Advanced Heart Failure Mechanical Circulatory Support

## 2022-12-19 NOTE — Plan of Care (Signed)

## 2022-12-19 NOTE — TOC Benefit Eligibility Note (Signed)
Patient Teacher, English as a foreign language completed.    The patient is currently admitted and upon discharge could be taking Entresto 24-26 mg.  The current 30 day co-pay is $532.55 due to a deductible.  Will be $47.00 once deductible is met.   The patient is insured through [employer]   This test claim was processed through [employer] amounts may vary at other pharmacies due to pharmacy/plan contracts, or as the patient moves through the different stages of their insurance plan.  Lyndel Safe, [place] Patient Advocate Specialist [facility] Patient Advocate Team Direct Number: [phone]  Fax: [phone]

## 2022-12-19 NOTE — Progress Notes (Addendum)
   Heart Failure Nurse Navigator Note  Met with patient and his wife [name].  They state they were able to speak with Dr. Daniel Nones this morning and want to proceed with the heart catheterization.  Went over the importance taking medications, GDMT, as ordered, the reasoning behind fluid and sodium restriction.  They voice understanding.  Wife is asking if patient at time of discharge could be placed in rehab at [facility].  They had no further questions.  Pricilla Riffle RN CHFN

## 2022-12-20 ENCOUNTER — Encounter: Payer: Self-pay | Admitting: Pharmacist

## 2022-12-20 ENCOUNTER — Telehealth (HOSPITAL_COMMUNITY): Payer: Self-pay

## 2022-12-20 ENCOUNTER — Other Ambulatory Visit (HOSPITAL_COMMUNITY): Payer: Self-pay

## 2022-12-20 DIAGNOSIS — I480 Paroxysmal atrial fibrillation: Secondary | ICD-10-CM | POA: Insufficient documentation

## 2022-12-20 DIAGNOSIS — I5043 Acute on chronic combined systolic (congestive) and diastolic (congestive) heart failure: Secondary | ICD-10-CM | POA: Diagnosis not present

## 2022-12-20 DIAGNOSIS — I2510 Atherosclerotic heart disease of native coronary artery without angina pectoris: Secondary | ICD-10-CM

## 2022-12-20 LAB — BASIC METABOLIC PANEL
Anion gap: 15 (ref 5–15)
BUN: 23 mg/dL (ref 8–23)
CO2: 26 mmol/L (ref 22–32)
Calcium: 9.2 mg/dL (ref 8.9–10.3)
Chloride: 102 mmol/L (ref 98–111)
Creatinine, Ser: 0.82 mg/dL (ref 0.61–1.24)
GFR, Estimated: >60 mL/min (ref >60)
Glucose, Bld: 138 mg/dL — ABNORMAL HIGH (ref 70–99)
Potassium: 3.9 mmol/L (ref 3.5–5.1)
Sodium: 143 mmol/L (ref 135–145)

## 2022-12-20 MED ORDER — APIXABAN 5 MG PO TABS
5.0000 mg | ORAL_TABLET | Freq: Two times a day (BID) | ORAL | Status: DC
  Administered 2022-12-20 – 2022-12-22 (×6): 5 mg via ORAL
  Filled 2022-12-20 (×6): qty 1

## 2022-12-20 MED ORDER — AMIODARONE HCL IN DEXTROSE 360-4.14 MG/200ML-% IV SOLN
30.0000 mg/h | INTRAVENOUS | Status: DC
  Administered 2022-12-20 – 2022-12-21 (×2): 30 mg/h via INTRAVENOUS
  Filled 2022-12-20: qty 200

## 2022-12-20 MED ORDER — ASPIRIN 81 MG PO TBEC
81.0000 mg | DELAYED_RELEASE_TABLET | Freq: Every day | ORAL | Status: DC
  Administered 2022-12-21: 81 mg via ORAL
  Filled 2022-12-20: qty 1

## 2022-12-20 MED ORDER — AMIODARONE LOAD VIA INFUSION
150.0000 mg | Freq: Once | INTRAVENOUS | Status: AC
  Administered 2022-12-20: 150 mg via INTRAVENOUS
  Filled 2022-12-20: qty 83.34

## 2022-12-20 MED ORDER — CARVEDILOL 6.25 MG PO TABS
6.2500 mg | ORAL_TABLET | Freq: Two times a day (BID) | ORAL | Status: DC
  Administered 2022-12-20 – 2022-12-22 (×5): 6.25 mg via ORAL
  Filled 2022-12-20 (×5): qty 1

## 2022-12-20 MED ORDER — AMIODARONE HCL IN DEXTROSE 360-4.14 MG/200ML-% IV SOLN
60.0000 mg/h | INTRAVENOUS | Status: AC
  Administered 2022-12-20 (×2): 60 mg/h via INTRAVENOUS
  Filled 2022-12-20 (×2): qty 200

## 2022-12-20 NOTE — Progress Notes (Signed)
Patient in new onset atrial fibrillation with RVR on telemetry. Will increase coreg to 6.25mg  BID (avoiding rapid uptitration in the setting of acute systolic heart failure) and start amiodarone load + apixaban 5mg  BID. Will require cardioversion if he does not chemically cardiovert.   Peter Ryan 2:11 PM

## 2022-12-20 NOTE — Progress Notes (Signed)
ADVANCED HEART FAILURE CLINIC NOTE  Referring Physician: No ref. provider found  Primary Care: Peter Dubin, NP   HPI: Peter Ryan is a 72 y.o. male with history of stroke and significant right-sided hemiparesis, liver cirrhosis, hypertension, type 2 diabetes, dyslipidemia, mild BPH, history of non-Hodgkin's lymphoma, obesity presenting to the hospital with a several day history of progressively worsening shortness of breath.  According to Peter Ryan at baseline he is bedbound and unable to care for himself.  He is primary caregiver is his wife who manages his medications and helps take care of him.  He has been nonambulatory since prior stroke.  He reports being in his usual state of health several days ago when he started to become increasingly short of breath with rapid progression of lower extremity swelling.  Due to progression of symptoms he came to the [facility] emergency department where he was found to be hypoxic to 89% on room air and severely volume overloaded.  Patient was given IV Lasix with significant improvement in symptoms.  In addition he had a CT chest that was negative for PE and admitted to the hospitalist team.  Since that time he has had an echocardiogram that demonstrated severely reduced EF.  PHYSICAL EXAM: Vitals:   12/20/22 0415 12/20/22 0806  BP: 117/72 123/68  Pulse: 83 82  Resp: 18 18  Temp: (!) 97.5 F (36.4 C) (!) 97.4 F (36.3 C)  SpO2: 95% 97%   GENERAL: Chronically ill-appearing white male sitting comfortably in bed HEENT: Negative for arcus senilis or xanthelasma. There is no scleral icterus.  The mucous membranes are pink and moist.   NECK: Supple, No masses. Normal carotid upstrokes without bruits. No masses or thyromegaly.    CHEST: There are no chest wall deformities. There is no chest wall tenderness. Respirations are unlabored.  Lungs-decreased lung sounds bilaterally CARDIAC:  JVP: 8cm         Normal S1, S2  Normal rate with regular  rhythm. No murmurs, rubs or gallops.  Pulses are 2+ and symmetrical in upper and lower extremities.  No edema. ABDOMEN: Soft, non-tender, non-distended. There are no masses or hepatomegaly. There are normal bowel sounds.  EXTREMITIES: Warm and well perfused with no cyanosis, clubbing.  LYMPHATIC: No axillary or supraclavicular lymphadenopathy.  NEUROLOGIC: Alert and oriented x 3.  Right-sided hemiparesis. PSYCH: Patients affect is appropriate, there is no evidence of anxiety or depression.  SKIN: Warm and dry; no lesions or wounds.   DATA REVIEW  ECG: Normal sinus rhythm with poor R wave progression as per my personal interpretation  ECHO: 12/18/2022: LVEF 25 to 30%.  Difficult to assess wall motion abnormalities but appears to have some hypokinesis of the anterior myocardium as per my personal interpretation  ASSESSMENT & PLAN:  Heart failure with reduced ejection fraction Etiology of HF: CT chest from admission demonstrates significant three-vessel coronary calcifications involving the proximal LAD circumflex and RCA.   NYHA class / AHA Stage: NYHA III.  Nonambulatory due to stroke.  Shortness of breath has improved significantly Volume status & Diuretics:  Euvolemic on exam, lasix 40mg  daily.  Vasodilators: D/C losartan, D/C clonidine. Start coreg 3.125mg  & Entresto 24/26mg  BID; uptitrate Entresto to 97/103 over the next 1-2 days. Amlodipine discontinued today. Can uptitrate Entresto to 49/51mg  tonight.  Beta-Blocker: continue coreg 3.125mg  BID.  MRA: continue spironolactone 25 mg daily due to heart failure and underlying liver cirrhosis Cardiometabolic: Will hold off.  I feel like patient has a high risk of  urinary tract infection due to hemiparesis Devices therapies & Valvulopathies: Not currently indicated  2. CAD - Lengthy discussion yesterday with interventional cardiology. I also spoke to the patient and his wife. At this time we will hold off on [facility] and pursue medical  management. He has stable CAD on CT chest with global hypokinesis on his echocardiogram; no evidence of ACS. In this setting, no data showing recovery of LVEF from PCI. In addition, from a symptomatic standpoint he is doing fairly well. No chest pain or shortness of breath.  - ASA  81mg  daily at discharge - Lipitor 80mg  daily  3. Liver cirrhosis -Paracentesis several years ago -LFTs normal this admission. -Hospitalist following.  4.  History of stroke -Nonambulatory at baseline with significant right-sided hemiparesis.  Peter Ryan Advanced Heart Failure Mechanical Circulatory Support

## 2022-12-20 NOTE — Progress Notes (Signed)
PROGRESS NOTE    Peter Ryan  HQI:[id number] DOB: Feb 24, 1951 DOA: 12/16/2022 PCP: Jennette Dubin, NP    Brief Narrative:  Peter Ryan is a 72 y.o. male medical history significant for stroke with right-sided hemiparesis and slurred speech at baseline, liver cirrhosis, hypertension, diabetes mellitus, dyslipidemia, chronic prostatitis, gout, BPH, bedbound status but can sit in the recliner, history of non-Hodgkin's lymphoma, history of thoracentesis, chronic hypokalemia, obesity, chronic anemia, who presented to the hospital because of shortness of breath and leg swelling. He has been feeling short of breath for about 2 weeks now but this has progressively worsened. He still occasionally has some mild leg swelling at baseline but he has noticed increasing swelling on the legs in the last few days preceding admission.  He also felt weak, tired and lightheaded.     Oxygen saturation was 89% on room air in the emergency department.  He was admitted to the hospital for acute exacerbation of chronic diastolic CHF.  He was treated with IV Lasix.  Potassium was low so he was treated with oral potassium chloride.  He was found to have new onset systolic congestive heart failure with markedly reduced ejection fraction 25 to 30%.  [facility] heart failure service was engaged for consultation.  No plans for inpatient catheterization however on 3/22 the patient did go into new onset atrial fibrillation with rapid ventricular response.  Ventricular rates 120s to 130s.  Patient to start intravenous amiodarone and p.o. Eliquis    Assessment & Plan:   Principal Problem:   Acute on chronic combined systolic and diastolic heart failure (HCC) Active Problems:   Hypokalemia   Type 2 diabetes mellitus (HCC)   History of stroke with residual deficit   Liver cirrhosis (HCC)   Prolonged QT interval   Bilateral pleural effusion  New onset paroxysmal atrial fibrillation with rapid ventricular response:  Patient noted to be in rapid atrial fibrillation on 3/20 2 AM.  Heart failure service reviewed telemetry and echocardiogram.  Confirmed rhythm.  Patient started on intravenous amiodarone infusion and p.o. Eliquis.  Per cardiology if patient does not chemically convert by Monday 3/25 he will require electrical cardioversion.  Acute on chronic systolic and diastolic CHF: Intravenous Lasix discontinued on 3/21.  Patient currently on carvedilol, Entresto, Aldactone.  2D echocardiogram EF 25 to 30% with grade 2 diastolic dysfunction.  Heart failure service following.  No plans for ischemic evaluation during this hospitalization.    Bilateral pleural effusions: He is asymptomatic.  He is not hypoxic.  IV Lasix has been discontinued.   Chronic hypokalemia: Discontinue potassium chloride supplement because potassium has normalized and he has been started on spironolactone.  Replete potassium as needed.     Prolonged QTc interval (504): This is likely due to hypokalemia.  Repeat EKG showed QTc interval of 536.    History of stroke with residual deficit: Continue aspirin and Lipitor   Liver cirrhosis: Compensated   Other comorbidities include hypertension, gout, BPH   DVT prophylaxis: Eliquis Code Status: DNR Family Communication: None today Disposition Plan: Status is: Inpatient Remains inpatient appropriate because: New onset atrial fibrillation rapid ventricular response   Level of care: Progressive  Consultants:  Heart failure  Procedures:  None  Antimicrobials: None   Subjective: Patient seen and examined.  Resting in bed.  No visible distress.  No specific complaints.  Objective: Vitals:   12/20/22 0806 12/20/22 0914 12/20/22 1045 12/20/22 1500  BP: 123/68  111/68 119/62  Pulse: 82 (!) 128 (!) 55 66  Resp: 18  20 (!) 21  Temp: (!) 97.4 F (36.3 C)  97.9 F (36.6 C) 98.8 F (37.1 C)  TempSrc: Oral  Oral Oral  SpO2: 97%  95% 94%  Weight:      Height:         Intake/Output Summary (Last 24 hours) at 12/20/2022 1530 Last data filed at 12/20/2022 0415 Gross per 24 hour  Intake 363 ml  Output 900 ml  Net -537 ml   Filed Weights   12/18/22 0352 12/19/22 0403 12/20/22 0500  Weight: 105.4 kg 103.6 kg 101.4 kg    Examination:  General exam: NAD.  Chronically ill Respiratory system: Lung sounds diminished at base this.  Normal work of breathing.  Room air Cardiovascular system: S1-S2, tachycardic, irregular rhythm, no appreciable murmurs Gastrointestinal system: Soft, NT/ND, normal bowel sounds Central nervous system: Alert and oriented.  Right-sided weakness.  Functional paraplegia Extremities: 0 x 5 power bilateral lower extremities, 0 x 5 power right upper extremity Skin: No rashes, lesions or ulcers Psychiatry: Judgement and insight appear normal. Mood & affect appropriate.     Data Reviewed: I have personally reviewed following labs and imaging studies  CBC: Recent Labs  Lab 12/16/22 1316 12/17/22 0503  WBC 10.8* 12.1*  NEUTROABS  --  8.6*  HGB 12.7* 12.1*  HCT 44.0 41.2  MCV 75.0* 75.2*  PLT 363 [id number]   Basic Metabolic Panel: Recent Labs  Lab 12/16/22 1316 12/17/22 0503 12/18/22 0539 12/19/22 0631 12/20/22 0625  NA 140 141 140 141 143  K 3.1* 3.5 3.6 4.3 3.9  CL 102 103 107 107 102  CO2 24 26 26 25 26   GLUCOSE 174* 132* 126* 121* 138*  BUN 12 12 18  24* 23  CREATININE 0.72 0.82 0.84 0.94 0.82  CALCIUM 9.2 8.6* 8.3* 8.8* 9.2  MG  --  1.8 1.9 1.9  --    GFR: Estimated Creatinine Clearance: 101.5 mL/min (by C-G formula based on SCr of 0.82 mg/dL). Liver Function Tests: No results for input(s): "AST", "ALT", "ALKPHOS", "BILITOT", "PROT", "ALBUMIN" in the last 168 hours. No results for input(s): "LIPASE", "AMYLASE" in the last 168 hours. No results for input(s): "AMMONIA" in the last 168 hours. Coagulation Profile: No results for input(s): "INR", "PROTIME" in the last 168 hours. Cardiac Enzymes: No results for  input(s): "CKTOTAL", "CKMB", "CKMBINDEX", "TROPONINI" in the last 168 hours. BNP (last 3 results) No results for input(s): "PROBNP" in the last 8760 hours. HbA1C: No results for input(s): "HGBA1C" in the last 72 hours. CBG: No results for input(s): "GLUCAP" in the last 168 hours. Lipid Profile: No results for input(s): "CHOL", "HDL", "LDLCALC", "TRIG", "CHOLHDL", "LDLDIRECT" in the last 72 hours. Thyroid Function Tests: No results for input(s): "TSH", "T4TOTAL", "FREET4", "T3FREE", "THYROIDAB" in the last 72 hours. Anemia Panel: No results for input(s): "VITAMINB12", "FOLATE", "FERRITIN", "TIBC", "IRON", "RETICCTPCT" in the last 72 hours. Sepsis Labs: Recent Labs  Lab 12/16/22 1316  PROCALCITON <0.10    Recent Results (from the past 240 hour(s))  Resp Panel by RT-PCR (Flu A&B, Covid) Anterior Nasal Swab     Status: None   Collection Time: 12/17/22 11:52 PM   Specimen: Anterior Nasal Swab  Result Value Ref Range Status   SARS Coronavirus 2 by RT PCR NEGATIVE NEGATIVE Final    Comment: (NOTE) SARS-CoV-2 target nucleic acids are NOT DETECTED.  The SARS-CoV-2 RNA is generally detectable in upper respiratory specimens during the acute phase of infection. The lowest concentration of SARS-CoV-2 viral copies this  assay can detect is 138 copies/mL. A negative result does not preclude SARS-Cov-2 infection and should not be used as the sole basis for treatment or other patient management decisions. A negative result may occur with  improper specimen collection/handling, submission of specimen other than nasopharyngeal swab, presence of viral mutation(s) within the areas targeted by this assay, and inadequate number of viral copies(<138 copies/mL). A negative result must be combined with clinical observations, patient history, and epidemiological information. The expected result is Negative.  Fact Sheet for Patients:  EntrepreneurPulse.com.au  Fact Sheet for  Healthcare Providers:  IncredibleEmployment.be  This test is no t yet approved or cleared by the [place] FDA and  has been authorized for detection and/or diagnosis of SARS-CoV-2 by FDA under an Emergency Use Authorization (EUA). This EUA will remain  in effect (meaning this test can be used) for the duration of the COVID-19 declaration under Section 564(b)(1) of the Act, 21 U.S.C.section 360bbb-3(b)(1), unless the authorization is terminated  or revoked sooner.       Influenza A by PCR NEGATIVE NEGATIVE Final   Influenza B by PCR NEGATIVE NEGATIVE Final    Comment: (NOTE) The Xpert Xpress SARS-CoV-2/FLU/RSV plus assay is intended as an aid in the diagnosis of influenza from Nasopharyngeal swab specimens and should not be used as a sole basis for treatment. Nasal washings and aspirates are unacceptable for Xpert Xpress SARS-CoV-2/FLU/RSV testing.  Fact Sheet for Patients: EntrepreneurPulse.com.au  Fact Sheet for Healthcare Providers: IncredibleEmployment.be  This test is not yet approved or cleared by the [place] FDA and has been authorized for detection and/or diagnosis of SARS-CoV-2 by FDA under an Emergency Use Authorization (EUA). This EUA will remain in effect (meaning this test can be used) for the duration of the COVID-19 declaration under Section 564(b)(1) of the Act, 21 U.S.C. section 360bbb-3(b)(1), unless the authorization is terminated or revoked.  Performed at [facility], [address], [place], [place] [address]          Radiology Studies: No results found.      Scheduled Meds:  allopurinol  300 mg Oral Daily   apixaban  5 mg Oral BID   [START ON 12/21/2022] aspirin EC  81 mg Oral Daily   atorvastatin  80 mg Oral Daily   carvedilol  6.25 mg Oral BID WC   cholecalciferol  1,000 Units Oral Daily   cyanocobalamin  1,000 mcg Oral Daily   ferrous sulfate  325 mg Oral QODAY    finasteride  5 mg Oral Daily   sacubitril-valsartan  1 tablet Oral BID   sodium chloride flush  3 mL Intravenous Q12H   spironolactone  25 mg Oral Daily   Continuous Infusions:  sodium chloride     amiodarone 60 mg/hr (12/20/22 1459)   Followed by   amiodarone       LOS: 4 days   Sidney Ace, MD [facility]   If 7PM-7AM, please contact night-coverage  12/20/2022, 3:30 PM

## 2022-12-20 NOTE — TOC Benefit Eligibility Note (Signed)
Patient Teacher, English as a foreign language completed.    The patient is currently admitted and upon discharge could be taking Eliquis.  The current 30 day co-pay is $532.55.   The patient is insured through [employer]   This test claim was processed through [place] amounts may vary at other pharmacies due to pharmacy/plan contracts, or as the patient moves through the different stages of their insurance plan.

## 2022-12-20 NOTE — Telephone Encounter (Signed)
Patient Advocate Encounter  The patient was approved for a [place] that will help cover the cost of Entresto.  Total amount awarded, $10,000.  Effective: 11/20/22 - 11/20/23.  BIN [id number] PCN [id number] Group [id number] ID [id number]  Patient provided with approval and processing information while inpatient.  Clista Bernhardt, CPhT Rx Patient Advocate Phone: [phone]

## 2022-12-20 NOTE — Progress Notes (Signed)
OT Cancellation Note  Patient Details Name: Peter Ryan MRN: [id number] DOB: [birth year]   Cancelled Treatment:    Reason Eval/Treat Not Completed: OT screened, no needs identified, will sign off. Chart reviewed. Upon arrival pt in bed with family at bed side, reports baseline pt stays in lift chair, transfers 1x/day with sit to stand lift and +2 assistance to [facility]. States at baseline, denies acute needs this date. Educated on requesting [place] therapies to decrease caregiver burden as needed.Will sign off.   Dessie Coma, M.S. OTR/L  12/20/22, 1:11 PM  ascom [phone]

## 2022-12-21 DIAGNOSIS — I5043 Acute on chronic combined systolic (congestive) and diastolic (congestive) heart failure: Secondary | ICD-10-CM | POA: Diagnosis not present

## 2022-12-21 DIAGNOSIS — I480 Paroxysmal atrial fibrillation: Secondary | ICD-10-CM

## 2022-12-21 DIAGNOSIS — E8779 Other fluid overload: Secondary | ICD-10-CM

## 2022-12-21 DIAGNOSIS — J90 Pleural effusion, not elsewhere classified: Secondary | ICD-10-CM

## 2022-12-21 DIAGNOSIS — R7989 Other specified abnormal findings of blood chemistry: Secondary | ICD-10-CM

## 2022-12-21 DIAGNOSIS — E8770 Fluid overload, unspecified: Secondary | ICD-10-CM

## 2022-12-21 DIAGNOSIS — R0602 Shortness of breath: Secondary | ICD-10-CM

## 2022-12-21 LAB — BASIC METABOLIC PANEL
Anion gap: 8 (ref 5–15)
BUN: 27 mg/dL — ABNORMAL HIGH (ref 8–23)
CO2: 24 mmol/L (ref 22–32)
Calcium: 8.6 mg/dL — ABNORMAL LOW (ref 8.9–10.3)
Chloride: 105 mmol/L (ref 98–111)
Creatinine, Ser: 0.87 mg/dL (ref 0.61–1.24)
GFR, Estimated: >60 mL/min (ref >60)
Glucose, Bld: 144 mg/dL — ABNORMAL HIGH (ref 70–99)
Potassium: 3.3 mmol/L — ABNORMAL LOW (ref 3.5–5.1)
Sodium: 137 mmol/L (ref 135–145)

## 2022-12-21 LAB — MAGNESIUM: Magnesium: 2 mg/dL (ref 1.7–2.4)

## 2022-12-21 MED ORDER — AMIODARONE HCL 200 MG PO TABS
200.0000 mg | ORAL_TABLET | Freq: Two times a day (BID) | ORAL | Status: DC
  Administered 2022-12-21 – 2022-12-22 (×4): 200 mg via ORAL
  Filled 2022-12-21 (×4): qty 1

## 2022-12-21 MED ORDER — FUROSEMIDE 40 MG PO TABS
40.0000 mg | ORAL_TABLET | Freq: Every day | ORAL | Status: DC
  Administered 2022-12-21 – 2022-12-22 (×2): 40 mg via ORAL
  Filled 2022-12-21 (×2): qty 1

## 2022-12-21 MED ORDER — SACUBITRIL-VALSARTAN 49-51 MG PO TABS
1.0000 | ORAL_TABLET | Freq: Two times a day (BID) | ORAL | Status: DC
  Administered 2022-12-21 – 2022-12-22 (×3): 1 via ORAL
  Filled 2022-12-21 (×3): qty 1

## 2022-12-21 MED ORDER — POTASSIUM CHLORIDE CRYS ER 20 MEQ PO TBCR
40.0000 meq | EXTENDED_RELEASE_TABLET | Freq: Once | ORAL | Status: AC
  Administered 2022-12-21: 40 meq via ORAL
  Filled 2022-12-21: qty 2

## 2022-12-21 NOTE — TOC Progression Note (Signed)
Transition of Care [facility]) - Progression Note    Patient Details  Name: Peter Ryan MRN: [id number] Date of Birth: [birth year]  [facility]) CM/SW Contact  Izola Price, RN Phone Number: 12/21/2022, 11:36 AM  Clinical Narrative:  12/21/22: Therapy evaluations completed and [facility], per Lavonia Drafts.,  accepted for [facility] services including PT/OT/RN/HH aide after speaking with spouse, [name], who wishes to be the contact person for [facility] set up. Spouse indicated has all needed DME at home as was bed bound/life recliner prior to admission. Able to transfer from lift recliner to [place]. Dependent for mobility and ADL's since prior stroke in 2022 per PT notes dated today. PCP and RX confirmed via chart. Qualifies for [facility] services per [facility] due to CHF diagnosis and disease management needs. Will need [facility] transfer to home on discharge. Simmie Davies RN CM          Expected Discharge Plan and Services                         DME Arranged: N/A DME Agency: NA       HH Arranged: OT, Nurse's Aide, PT, RN, Disease Management [place] Agency: [place] ([facility]) Date HH Agency Contacted: 12/21/22 Time [place]: 1136     Social Determinants of Health (SDOH) Interventions SDOH Screenings   Food Insecurity: No Food Insecurity (12/17/2022)  Housing: Low Risk  (12/17/2022)  Transportation Needs: No Transportation Needs (12/17/2022)  Utilities: Not At Risk (12/17/2022)  Alcohol Screen: Low Risk  (10/17/2021)  Depression (PHQ2-9): Low Risk  (10/17/2021)  Financial Resource Strain: Low Risk  (10/17/2021)  Physical Activity: Insufficiently Active (10/17/2021)  Social Connections: Socially Isolated (10/17/2021)  Stress: No Stress Concern Present (10/17/2021)  Tobacco Use: Medium Risk (12/17/2022)    Readmission Risk Interventions     No data to display

## 2022-12-21 NOTE — Progress Notes (Signed)
PT Cancellation Note  Patient Details Name: Peter Ryan MRN: [id number] DOB: March 06, 1951   Cancelled Treatment:    Reason Eval/Treat Not Completed: Other (comment). PT orders received and pt chart reviewed. Per conversation with spouse at bedside, pt sleeps/stays in lift recliner at home and uses stander to transfer to/from [facility] and to lift recliner. Spouse states that pt has been dependent for mobility, ADL's, and IADL's since his stroke in 2022. Due to pt being dependent at baseline, he is not an appropriate candidate for skilled acute PT services. Therefore, PT to sign off.  Spoke with spouse at bedside; she states that she has appropriate DME at home, but would like more assistance. Explained that STR would not be an option, due to baseline function. Spouse agreeable, and states she cannot afford additional caregiver assistance at home. Matter brought to CM/SW attention. Also inquires about obtaining disability for pt.    Herminio Commons, PT, DPT 11:11 AM,12/21/22 Physical Therapist - [facility]

## 2022-12-21 NOTE — Progress Notes (Signed)
Cardiology Progress Note   Patient Name: Peter Ryan Date of Encounter: 12/21/2022  Primary Cardiologist: Hebert Soho, DO  Subjective   Feels well.  Afib yesterday  Broke @ [id number].  Asymptomatic.  Sinus in 70's this AM.  No chest pain or dyspnea.  Inpatient Medications    Scheduled Meds:  allopurinol  300 mg Oral Daily   amiodarone  200 mg Oral BID   apixaban  5 mg Oral BID   aspirin EC  81 mg Oral Daily   atorvastatin  80 mg Oral Daily   carvedilol  6.25 mg Oral BID WC   cholecalciferol  1,000 Units Oral Daily   cyanocobalamin  1,000 mcg Oral Daily   ferrous sulfate  325 mg Oral QODAY   finasteride  5 mg Oral Daily   sacubitril-valsartan  1 tablet Oral BID   sodium chloride flush  3 mL Intravenous Q12H   spironolactone  25 mg Oral Daily   Continuous Infusions:  sodium chloride     PRN Meds: sodium chloride, acetaminophen, calcium carbonate, ondansetron (ZOFRAN) IV, mouth rinse, polyethylene glycol, sodium chloride flush   Vital Signs    Vitals:   12/21/22 0811 12/21/22 0847 12/21/22 1000 12/21/22 1100  BP:  130/78 123/67 120/66  Pulse: 76 78  78  Resp:  15 17 (!) 22  Temp:  97.8 F (36.6 C)  97.8 F (36.6 C)  TempSrc: Oral Oral  Oral  SpO2: 96% 95%  97%  Weight:      Height:        Intake/Output Summary (Last 24 hours) at 12/21/2022 1234 Last data filed at 12/21/2022 1126 Gross per 24 hour  Intake 753.01 ml  Output 900 ml  Net -146.99 ml   Filed Weights   12/19/22 0403 12/20/22 0500 12/21/22 0517  Weight: 103.6 kg 101.4 kg 101.9 kg    Physical Exam   GEN: Well nourished, well developed, in no acute distress.  HEENT: Grossly normal.  Neck: Supple, no JVD, carotid bruits, or masses. Cardiac: RRR, no murmurs, rubs, or gallops. No clubbing, cyanosis, edema.  Radials 2+, DP/PT 2+ and equal bilaterally.  Respiratory:  Respirations regular and unlabored, diminished breath sounds @ bilat bases. GI: Soft, nontender, nondistended, BS + x 4. MS: no  deformity or atrophy. Skin: warm and dry, no rash. Neuro:  R hemiparesis. Psych: AAOx3.  Normal affect.  Labs    Chemistry Recent Labs  Lab 12/19/22 0631 12/20/22 0625 12/21/22 0758  NA 141 143 137  K 4.3 3.9 3.3*  CL 107 102 105  CO2 25 26 24   GLUCOSE 121* 138* 144*  BUN 24* 23 27*  CREATININE 0.94 0.82 0.87  CALCIUM 8.8* 9.2 8.6*  GFRNONAA >60 >60 >60  ANIONGAP 9 15 8      Hematology Recent Labs  Lab 12/16/22 1316 12/17/22 0503  WBC 10.8* 12.1*  RBC 5.87* 5.48  HGB 12.7* 12.1*  HCT 44.0 41.2  MCV 75.0* 75.2*  MCH 21.6* 22.1*  MCHC 28.9* 29.4*  RDW 19.7* 19.4*  PLT 363 345    Cardiac Enzymes  Recent Labs  Lab 12/16/22 1316  TROPONINIHS 13      BNP    Component Value Date/Time   BNP 565.4 (H) 12/16/2022 1316   Lipids  Lab Results  Component Value Date   CHOL 153 12/01/2020   HDL 28 (L) 12/01/2020   LDLCALC 71 12/01/2020   TRIG 270 (H) 12/01/2020   CHOLHDL 5.5 12/01/2020    HbA1c  Lab Results  Component Value Date   HGBA1C 7.3 (H) 02/08/2022    Radiology    ---------  Telemetry    Afib converted to sinus rhythm on 3/22 @ 15:19, sinus rhythm since - Personally Reviewed  Cardiac Studies   2D Echocardiogram 3.19.2024  1. Left ventricular ejection fraction, by estimation, is 25 to 30%. The  left ventricle has severely decreased function. Left ventricular  endocardial border not optimally defined to evaluate regional wall motion.  There is mild left ventricular  hypertrophy. Left ventricular diastolic parameters are consistent with  Grade II diastolic dysfunction (pseudonormalization).   2. Right ventricular systolic function was not well visualized. The right  ventricular size is normal. Tricuspid regurgitation signal is inadequate  for assessing PA pressure.   3. The mitral valve is abnormal. Trivial mitral valve regurgitation. No  evidence of mitral stenosis.   4. The aortic valve has an indeterminant number of cusps. There is  mild  calcification of the aortic valve. There is mild thickening of the aortic  valve. Aortic valve regurgitation is not visualized. Aortic valve  sclerosis is present, with no evidence of   aortic valve stenosis.   5. Aortic dilatation noted. There is borderline dilatation of the aortic  root, measuring 38 mm. There is mild dilatation of the ascending aorta,  measuring 40 mm.   6. The inferior vena cava is normal in size with <50% respiratory  variability, suggesting right atrial pressure of 8 mmHg.  _____________   Patient Profile     72 y.o. male w/ a h/o CVA w/ R sided hemiparesis, liver cirrhosis, HTN, HL, DMII, BPH, non-hodgkins lymphoma, and obesity, who was aditted 3/18 w/ progressive dyspnea and found to have newly dx LV dysfxn and CHF.  Assessment & Plan    1.  Acute HFrEF/presumed ICM: Bed bound at baseline.  Admitted w/ progressive dyspnea  Volume overloaded.  EF 25-30%, grade 2 diast dysfxn.  CT chest from admission w/ significant 3 vessel cor Ca2+.  Minus 239.1 ml overnight and 3L since admission.  Wt 101.9kg this AM.  He has not been on lasix since 3/21.  He remains euvolemic on exam and feels well.  Lying flat.  Cont ? blocker, entresto, spiro.  No sglt2i 2/2 bedridden state/risk for UTI/yeast.  Will add lasix 40 PO daily, as wt is up slightly and he is likely to require some amt of oral diuretic in outpt setting.  Outpt f/u will be challenging for him given bedridden state (spends entire day in a large recliner).  Will benefit from heart failure @ home program - ? [facility].  2.  CAD:  CT chest w/ 3 vessel cor Ca2+.  HsTrop nl on admission.  No chest pain.  Previously discussed w/ pt/family/interventional team/CHF team  Conservative mgmt for now.  Cont statin.  Will d/c asa in light of eliquis initiation.  3.  PAF:  developed Afib on 3/22.  Amio/eliquis started (CHA2DS2VASc = 8) and he converted to sinus @ 15:19 on 3/22.  Holding sinus this AM.  Asymptomatic when in  afib, ? Prior burden.  Will switch amio to PO.  H/o prolonged QT so will load w/ 200 bid and f/u ECG in AM.  Cont ? blocker and eliquis.  4. Liver cirrhosis:  s/p prior paracentesis.  Per IM.  5.  History of CVA:  w/ significant R hemiparesis.  Activity limited to bed @ home.  Afib discovered 3/22  Eliquis started.  6.  Hypokalemia:  Supp.  Signed,  Murray Hodgkins, NP  12/21/2022, 12:34 PM    For questions or updates, please contact   Please consult [email or web address] for contact info under Cardiology/STEMI.

## 2022-12-21 NOTE — Progress Notes (Signed)
PROGRESS NOTE    Peter Ryan  [id number] DOB: 1951-06-19 DOA: 12/16/2022 PCP: Jennette Dubin, NP    Brief Narrative:  Peter Ryan is a 72 y.o. male medical history significant for stroke with right-sided hemiparesis and slurred speech at baseline, liver cirrhosis, hypertension, diabetes mellitus, dyslipidemia, chronic prostatitis, gout, BPH, bedbound status but can sit in the recliner, history of non-Hodgkin's lymphoma, history of thoracentesis, chronic hypokalemia, obesity, chronic anemia, who presented to the hospital because of shortness of breath and leg swelling. He has been feeling short of breath for about 2 weeks now but this has progressively worsened. He still occasionally has some mild leg swelling at baseline but he has noticed increasing swelling on the legs in the last few days preceding admission.  He also felt weak, tired and lightheaded.     Oxygen saturation was 89% on room air in the emergency department.  He was admitted to the hospital for acute exacerbation of chronic diastolic CHF.  He was treated with IV Lasix.  Potassium was low so he was treated with oral potassium chloride.  He was found to have new onset systolic congestive heart failure with markedly reduced ejection fraction 25 to 30%.  [facility] heart failure service was engaged for consultation.  No plans for inpatient catheterization however on 3/22 the patient did go into new onset atrial fibrillation with rapid ventricular response.  Ventricular rates 120s to 130s.  Patient to start intravenous amiodarone and p.o. Eliquis  3/23: Atrial fibrillation broke at 1519 yesterday.  Asymptomatic.  Now sinus in 70s.    Assessment & Plan:   Principal Problem:   Acute on chronic combined systolic and diastolic heart failure (HCC) Active Problems:   Hypokalemia   Type 2 diabetes mellitus (HCC)   History of stroke with residual deficit   Liver cirrhosis (HCC)   Prolonged QT interval   Bilateral pleural  effusion   Paroxysmal atrial fibrillation with RVR ([place])  New onset paroxysmal atrial fibrillation with rapid ventricular response: Patient noted to be in rapid atrial fibrillation on 3/22.  Heart failure service reviewed telemetry and EKG.  Confirmed rhythm.  Patient started on intravenous amiodarone infusion and p.o. Eliquis.  Patient chemically converted on 3/22 at 1519.  Seen in follow-up by cardiology on 3/23.  Converted to p.o. amiodarone.  If patient remains asymptomatic and in sinus rhythm anticipate discharge home with home health services 3/24.  Acute on chronic systolic and diastolic CHF: Intravenous Lasix discontinued on 3/21.  Patient currently on carvedilol, Entresto, Aldactone.  2D echocardiogram EF 25 to 30% with grade 2 diastolic dysfunction.  Heart failure service following.  No plans for ischemic evaluation during this hospitalization.  Anticipate discharge 3/24   Bilateral pleural effusions: He is asymptomatic.  He is not hypoxic.  IV Lasix has been discontinued.   Chronic hypokalemia: Discontinue potassium chloride supplement because potassium has normalized and he has been started on spironolactone.  Replete potassium as needed.     Prolonged QTc interval (504): This is likely due to hypokalemia.  Repeat EKG showed QTc interval of 536.    History of stroke with residual deficit: Continue aspirin and Lipitor   Liver cirrhosis: Compensated   Other comorbidities include hypertension, gout, BPH   DVT prophylaxis: Eliquis Code Status: DNR Family Communication: None today Disposition Plan: Status is: Inpatient Remains inpatient appropriate because: New onset atrial fibrillation rapid ventricular response   Level of care: Progressive  Consultants:  Heart failure  Procedures:  None  Antimicrobials: None  Subjective: Patient seen and examined.  Resting comfortably in bed.  No visible distress.  No complaints of pain.  Objective: Vitals:   12/21/22 0811  12/21/22 0847 12/21/22 1000 12/21/22 1100  BP:  130/78 123/67 120/66  Pulse: 76 78  78  Resp:  15 17 (!) 22  Temp:  97.8 F (36.6 C)  97.8 F (36.6 C)  TempSrc: Oral Oral  Oral  SpO2: 96% 95%  97%  Weight:      Height:        Intake/Output Summary (Last 24 hours) at 12/21/2022 1412 Last data filed at 12/21/2022 1126 Gross per 24 hour  Intake 753.01 ml  Output 900 ml  Net -146.99 ml   Filed Weights   12/19/22 0403 12/20/22 0500 12/21/22 0517  Weight: 103.6 kg 101.4 kg 101.9 kg    Examination:  General exam: No acute distress Respiratory system: Lungs clear.  Normal work of breathing.  Room air Cardiovascular system: S1-S2, RRR, no murmurs, no pedal edema Gastrointestinal system: Soft, NT/ND, normal bowel sounds Central nervous system: Alert and oriented.  Right-sided weakness.  Functional paraplegia Extremities: 0 x 5 power bilateral lower extremities, 0 x 5 power right upper extremity Skin: No rashes, lesions or ulcers Psychiatry: Judgement and insight appear normal. Mood & affect appropriate.     Data Reviewed: I have personally reviewed following labs and imaging studies  CBC: Recent Labs  Lab 12/16/22 1316 12/17/22 0503  WBC 10.8* 12.1*  NEUTROABS  --  8.6*  HGB 12.7* 12.1*  HCT 44.0 41.2  MCV 75.0* 75.2*  PLT 363 [id number]   Basic Metabolic Panel: Recent Labs  Lab 12/17/22 0503 12/18/22 0539 12/19/22 0631 12/20/22 0625 12/21/22 0758  NA 141 140 141 143 137  K 3.5 3.6 4.3 3.9 3.3*  CL 103 107 107 102 105  CO2 26 26 25 26 24   GLUCOSE 132* 126* 121* 138* 144*  BUN 12 18 24* 23 27*  CREATININE 0.82 0.84 0.94 0.82 0.87  CALCIUM 8.6* 8.3* 8.8* 9.2 8.6*  MG 1.8 1.9 1.9  --  2.0   GFR: Estimated Creatinine Clearance: 95.9 mL/min (by C-G formula based on SCr of 0.87 mg/dL). Liver Function Tests: No results for input(s): "AST", "ALT", "ALKPHOS", "BILITOT", "PROT", "ALBUMIN" in the last 168 hours. No results for input(s): "LIPASE", "AMYLASE" in the last 168  hours. No results for input(s): "AMMONIA" in the last 168 hours. Coagulation Profile: No results for input(s): "INR", "PROTIME" in the last 168 hours. Cardiac Enzymes: No results for input(s): "CKTOTAL", "CKMB", "CKMBINDEX", "TROPONINI" in the last 168 hours. BNP (last 3 results) No results for input(s): "PROBNP" in the last 8760 hours. HbA1C: No results for input(s): "HGBA1C" in the last 72 hours. CBG: No results for input(s): "GLUCAP" in the last 168 hours. Lipid Profile: No results for input(s): "CHOL", "HDL", "LDLCALC", "TRIG", "CHOLHDL", "LDLDIRECT" in the last 72 hours. Thyroid Function Tests: No results for input(s): "TSH", "T4TOTAL", "FREET4", "T3FREE", "THYROIDAB" in the last 72 hours. Anemia Panel: No results for input(s): "VITAMINB12", "FOLATE", "FERRITIN", "TIBC", "IRON", "RETICCTPCT" in the last 72 hours. Sepsis Labs: Recent Labs  Lab 12/16/22 1316  PROCALCITON <0.10    Recent Results (from the past 240 hour(s))  Resp Panel by RT-PCR (Flu A&B, Covid) Anterior Nasal Swab     Status: None   Collection Time: 12/17/22 11:52 PM   Specimen: Anterior Nasal Swab  Result Value Ref Range Status   SARS Coronavirus 2 by RT PCR NEGATIVE NEGATIVE Final  Comment: (NOTE) SARS-CoV-2 target nucleic acids are NOT DETECTED.  The SARS-CoV-2 RNA is generally detectable in upper respiratory specimens during the acute phase of infection. The lowest concentration of SARS-CoV-2 viral copies this assay can detect is 138 copies/mL. A negative result does not preclude SARS-Cov-2 infection and should not be used as the sole basis for treatment or other patient management decisions. A negative result may occur with  improper specimen collection/handling, submission of specimen other than nasopharyngeal swab, presence of viral mutation(s) within the areas targeted by this assay, and inadequate number of viral copies(<138 copies/mL). A negative result must be combined with clinical  observations, patient history, and epidemiological information. The expected result is Negative.  Fact Sheet for Patients:  EntrepreneurPulse.com.au  Fact Sheet for Healthcare Providers:  IncredibleEmployment.be  This test is no t yet approved or cleared by the [place] FDA and  has been authorized for detection and/or diagnosis of SARS-CoV-2 by FDA under an Emergency Use Authorization (EUA). This EUA will remain  in effect (meaning this test can be used) for the duration of the COVID-19 declaration under Section 564(b)(1) of the Act, 21 U.S.C.section 360bbb-3(b)(1), unless the authorization is terminated  or revoked sooner.       Influenza A by PCR NEGATIVE NEGATIVE Final   Influenza B by PCR NEGATIVE NEGATIVE Final    Comment: (NOTE) The Xpert Xpress SARS-CoV-2/FLU/RSV plus assay is intended as an aid in the diagnosis of influenza from Nasopharyngeal swab specimens and should not be used as a sole basis for treatment. Nasal washings and aspirates are unacceptable for Xpert Xpress SARS-CoV-2/FLU/RSV testing.  Fact Sheet for Patients: EntrepreneurPulse.com.au  Fact Sheet for Healthcare Providers: IncredibleEmployment.be  This test is not yet approved or cleared by the [place] FDA and has been authorized for detection and/or diagnosis of SARS-CoV-2 by FDA under an Emergency Use Authorization (EUA). This EUA will remain in effect (meaning this test can be used) for the duration of the COVID-19 declaration under Section 564(b)(1) of the Act, 21 U.S.C. section 360bbb-3(b)(1), unless the authorization is terminated or revoked.  Performed at [facility], [address] [place], [place] [address]          Radiology Studies: No results found.      Scheduled Meds:  allopurinol  300 mg Oral Daily   amiodarone  200 mg Oral BID   apixaban  5 mg Oral BID   atorvastatin  80 mg  Oral Daily   carvedilol  6.25 mg Oral BID WC   cholecalciferol  1,000 Units Oral Daily   cyanocobalamin  1,000 mcg Oral Daily   ferrous sulfate  325 mg Oral QODAY   finasteride  5 mg Oral Daily   furosemide  40 mg Oral Daily   sacubitril-valsartan  1 tablet Oral BID   sodium chloride flush  3 mL Intravenous Q12H   spironolactone  25 mg Oral Daily   Continuous Infusions:  sodium chloride       LOS: 5 days   Sidney Ace, MD [facility]   If 7PM-7AM, please contact night-coverage  12/21/2022, 2:12 PM

## 2022-12-22 DIAGNOSIS — I420 Dilated cardiomyopathy: Secondary | ICD-10-CM

## 2022-12-22 DIAGNOSIS — I5033 Acute on chronic diastolic (congestive) heart failure: Secondary | ICD-10-CM | POA: Diagnosis not present

## 2022-12-22 LAB — HEPATIC FUNCTION PANEL
ALT: 15 U/L (ref 0–44)
AST: 14 U/L — ABNORMAL LOW (ref 15–41)
Albumin: 3.3 g/dL — ABNORMAL LOW (ref 3.5–5.0)
Alkaline Phosphatase: 70 U/L (ref 38–126)
Bilirubin, Direct: <0.1 mg/dL (ref 0.0–0.2)
Total Bilirubin: 0.5 mg/dL (ref 0.3–1.2)
Total Protein: 7.4 g/dL (ref 6.5–8.1)

## 2022-12-22 LAB — BASIC METABOLIC PANEL
Anion gap: 7 (ref 5–15)
BUN: 23 mg/dL (ref 8–23)
CO2: 24 mmol/L (ref 22–32)
Calcium: 8.6 mg/dL — ABNORMAL LOW (ref 8.9–10.3)
Chloride: 108 mmol/L (ref 98–111)
Creatinine, Ser: 0.82 mg/dL (ref 0.61–1.24)
GFR, Estimated: >60 mL/min (ref >60)
Glucose, Bld: 142 mg/dL — ABNORMAL HIGH (ref 70–99)
Potassium: 3.4 mmol/L — ABNORMAL LOW (ref 3.5–5.1)
Sodium: 139 mmol/L (ref 135–145)

## 2022-12-22 LAB — MAGNESIUM: Magnesium: 2 mg/dL (ref 1.7–2.4)

## 2022-12-22 MED ORDER — FUROSEMIDE 40 MG PO TABS: 40.0000 mg | ORAL_TABLET | Freq: Every day | ORAL | 0 refills | Status: DC

## 2022-12-22 MED ORDER — SPIRONOLACTONE 25 MG PO TABS: 25.0000 mg | ORAL_TABLET | Freq: Every day | ORAL | 0 refills | Status: DC

## 2022-12-22 MED ORDER — SACUBITRIL-VALSARTAN 49-51 MG PO TABS: 1.0000 | ORAL_TABLET | Freq: Two times a day (BID) | ORAL | 0 refills | Status: AC

## 2022-12-22 MED ORDER — POTASSIUM CHLORIDE CRYS ER 20 MEQ PO TBCR
40.0000 meq | EXTENDED_RELEASE_TABLET | Freq: Two times a day (BID) | ORAL | Status: DC
  Administered 2022-12-22 (×2): 40 meq via ORAL
  Filled 2022-12-22 (×2): qty 2

## 2022-12-22 MED ORDER — CARVEDILOL 6.25 MG PO TABS: 6.2500 mg | ORAL_TABLET | Freq: Two times a day (BID) | ORAL | 0 refills | Status: DC

## 2022-12-22 MED ORDER — AMIODARONE HCL 200 MG PO TABS: 200.0000 mg | ORAL_TABLET | Freq: Two times a day (BID) | ORAL | 0 refills | Status: DC

## 2022-12-22 MED ORDER — FUROSEMIDE 10 MG/ML IJ SOLN
40.0000 mg | Freq: Once | INTRAMUSCULAR | Status: AC
  Administered 2022-12-22: 40 mg via INTRAVENOUS
  Filled 2022-12-22: qty 4

## 2022-12-22 MED ORDER — APIXABAN 5 MG PO TABS: 5.0000 mg | ORAL_TABLET | Freq: Two times a day (BID) | ORAL | 0 refills | Status: DC

## 2022-12-22 NOTE — TOC Transition Note (Signed)
Transition of Care [facility]) - CM/SW Discharge Note   Patient Details  Name: Peter Ryan MRN: [id number] Date of Birth: 20-Oct-1950  [facility] CM/SW Contact:  Izola Price, RN Phone Number: 12/22/2022, 2:29 PM   Clinical Narrative:  3/23: Potential discharge today. EMS forms printed to unit RN. Arranging for medications that spouse is unable to obtain today. [place] set up via [facility]. Has all needed DME at home. Simmie Davies RN CM      Final next level of care: [place]     Patient Goals and CMS Choice      Discharge Placement                         Discharge Plan and Services Additional resources added to the After Visit Summary for                  DME Arranged: N/A DME Agency: NA       HH Arranged: OT, Nurse's Aide, PT, RN, Disease Management [place] Agency: [place] ([facility]) Date HH Agency Contacted: 12/21/22 Time [place]: 1136    Social Determinants of Health (SDOH) Interventions SDOH Screenings   Food Insecurity: No Food Insecurity (12/17/2022)  Housing: Low Risk  (12/17/2022)  Transportation Needs: No Transportation Needs (12/17/2022)  Utilities: Not At Risk (12/17/2022)  Alcohol Screen: Low Risk  (10/17/2021)  Depression (PHQ2-9): Low Risk  (10/17/2021)  Financial Resource Strain: Low Risk  (10/17/2021)  Physical Activity: Insufficiently Active (10/17/2021)  Social Connections: Socially Isolated (10/17/2021)  Stress: No Stress Concern Present (10/17/2021)  Tobacco Use: Medium Risk (12/17/2022)     Readmission Risk Interventions     No data to display

## 2022-12-22 NOTE — Discharge Summary (Signed)
Physician Discharge Summary  Peter Ryan [id number] DOB: [birth year] DOA: 12/16/2022  PCP: Jennette Dubin, NP  Admit date: 12/16/2022 Discharge date: 12/22/2022  Admitted From: Home Disposition:  Home with home health  Recommendations for Outpatient Follow-up:  Follow up with PCP in 1-2 weeks Follow up in heart failure clinic  Home Health:Yes PT OT RN Aide Equipment/Devices:None   Discharge Condition:Stable  CODE STATUS:DNR  Diet recommendation: Cardiac   Brief/Interim Summary: Peter Ryan is a 72 y.o. male medical history significant for stroke with right-sided hemiparesis and slurred speech at baseline, liver cirrhosis, hypertension, diabetes mellitus, dyslipidemia, chronic prostatitis, gout, BPH, bedbound status but can sit in the recliner, history of non-Hodgkin's lymphoma, history of thoracentesis, chronic hypokalemia, obesity, chronic anemia, who presented to the hospital because of shortness of breath and leg swelling. He has been feeling short of breath for about 2 weeks now but this has progressively worsened. He still occasionally has some mild leg swelling at baseline but he has noticed increasing swelling on the legs in the last few days preceding admission.  He also felt weak, tired and lightheaded.     Oxygen saturation was 89% on room air in the emergency department.  He was admitted to the hospital for acute exacerbation of chronic diastolic CHF.  He was treated with IV Lasix.  Potassium was low so he was treated with oral potassium chloride.   He was found to have new onset systolic congestive heart failure with markedly reduced ejection fraction 25 to 30%.  [facility] heart failure service was engaged for consultation.  No plans for inpatient catheterization however on 3/22 the patient did go into new onset atrial fibrillation with rapid ventricular response.  Ventricular rates 120s to 130s.  Patient to start intravenous amiodarone and p.o. Eliquis   3/23: Atrial  fibrillation broke at 1519 yesterday.  Asymptomatic.  Now sinus in 70s. 3/24: Stable, remains in NSR. Seen by cardiology.  OK for dc home    Discharge Diagnoses:  Principal Problem:   Acute on chronic diastolic CHF (congestive heart failure) (HCC) Active Problems:   Hypokalemia   Type 2 diabetes mellitus (HCC)   History of stroke with residual deficit   Liver cirrhosis (HCC)   Prolonged QT interval   Pleural effusion, bilateral   Paroxysmal atrial fibrillation with RVR (HCC)   Elevated brain natriuretic peptide (BNP) level   Hypervolemia   Dilated cardiomyopathy ([facility])  New onset paroxysmal atrial fibrillation with rapid ventricular response: Patient noted to be in rapid atrial fibrillation on 3/22.  Heart failure service reviewed telemetry and EKG.  Confirmed rhythm.  Patient started on intravenous amiodarone infusion and p.o. Eliquis.  Patient chemically converted on 3/22 at 1519.  Seen in follow-up by cardiology on 3/23.  Converted to p.o. amiodarone. Will dc on amio 200 BID and eliquis 5 BID   Acute on chronic systolic and diastolic CHF: Intravenous Lasix discontinued on 3/21.  Patient currently on carvedilol, Entresto, Aldactone.  2D echocardiogram EF 25 to 30% with grade 2 diastolic dysfunction.  Heart failure service following.  No plans for ischemic evaluation during this hospitalization.  Discharge with outpatient followup in HF clinic   Bilateral pleural effusions: He is asymptomatic.  He is not hypoxic.  IV Lasix has been discontinued.   Chronic hypokalemia: Discontinue potassium chloride supplement because potassium has normalized and he has been started on spironolactone. Follow up outpatient for repeat labwork.   History of stroke with residual deficit: Continue aspirin and Lipitor   Liver cirrhosis:  Compensated   Other comorbidities include hypertension, gout, BPH  Discharge Instructions  Discharge Instructions     Amb Referral to HF Clinic   Complete by: As  directed    Diet - low sodium heart healthy   Complete by: As directed    Increase activity slowly   Complete by: As directed       Allergies as of 12/22/2022       Reactions   Hctz [hydrochlorothiazide] Other (See Comments)   Gout flare   Metformin Other (See Comments)   Stomach upset        Medication List     STOP taking these medications    amLODipine 10 MG tablet Commonly known as: NORVASC   aspirin EC 325 MG tablet   cloNIDine 0.1 MG tablet Commonly known as: CATAPRES   losartan 100 MG tablet Commonly known as: COZAAR   potassium chloride SA 20 MEQ tablet Commonly known as: KLOR-CON M       TAKE these medications    acetaminophen 325 MG tablet Commonly known as: TYLENOL Take 2 tablets (650 mg total) by mouth every 4 (four) hours as needed for mild pain (or temp > 37.5 C (99.5 F)).   allopurinol 300 MG tablet Commonly known as: ZYLOPRIM TAKE ONE TABLET EVERY DAY   amiodarone 200 MG tablet Commonly known as: PACERONE Take 1 tablet (200 mg total) by mouth 2 (two) times daily. Start taking on: December 23, 2022   apixaban 5 MG Tabs tablet Commonly known as: ELIQUIS Take 1 tablet (5 mg total) by mouth 2 (two) times daily. Start taking on: December 23, 2022   atorvastatin 80 MG tablet Commonly known as: LIPITOR TAKE ONE TABLET (80 MG) BY MOUTH EVERY DAY   Boudreauxs Butt Paste 40 % ointment Generic drug: liver oil-zinc oxide Apply 1 application. topically 2 (two) times daily.   carvedilol 6.25 MG tablet Commonly known as: COREG Take 1 tablet (6.25 mg total) by mouth 2 (two) times daily with a meal. Start taking on: December 23, 2022   cyanocobalamin 1000 MCG tablet Commonly known as: VITAMIN B12 Take 1 tablet (1,000 mcg total) by mouth daily.   ferrous sulfate 325 (65 FE) MG tablet Take 1 tablet (325 mg total) by mouth every other day. What changed: additional instructions   finasteride 5 MG tablet Commonly known as: PROSCAR Take 1 tablet (5  mg total) by mouth daily.   furosemide 40 MG tablet Commonly known as: LASIX Take 1 tablet (40 mg total) by mouth daily. Start taking on: December 23, 2022   hydrocortisone 2.5 % lotion Apply topically as directed. Apply to affected areas as needed up to three times weekly.   polyethylene glycol 17 g packet Commonly known as: MIRALAX / GLYCOLAX Take 17 g by mouth daily as needed.   sacubitril-valsartan 49-51 MG Commonly known as: ENTRESTO Take 1 tablet by mouth 2 (two) times daily. Start taking on: December 23, 2022   spironolactone 25 MG tablet Commonly known as: ALDACTONE Take 1 tablet (25 mg total) by mouth daily. Start taking on: December 23, 2022   Vitamin D3 25 MCG (1000 UT) capsule Generic drug: Cholecalciferol Take 1 capsule (1,000 Units total) by mouth daily.        Follow-up Information     Schmerge, Meredeth Ide, NP. Schedule an appointment as soon as possible for a visit in 1 week(s).   Specialty: Nurse Practitioner Contact information: [address] [place] [address] [phone]  Allergies  Allergen Reactions   Hctz [Hydrochlorothiazide] Other (See Comments)    Gout flare   Metformin Other (See Comments)    Stomach upset     Consultations: Cardiology/heart failure   Procedures/Studies: ECHOCARDIOGRAM COMPLETE  Result Date: 12/18/2022    ECHOCARDIOGRAM REPORT   Patient Name:   Peter Ryan Date of Exam: 12/17/2022 Medical Rec #:  [id number]       Height:       70.0 in Accession #:    [id number]      Weight:       260.0 lb Date of Birth:  08-May-1951       BSA:          2.334 m Patient Age:    72 years        BP:           148/96 mmHg Patient Gender: M               HR:           91 bpm. Exam Location:  [facility] Procedure: 2D Echo, Cardiac Doppler, Color Doppler and Intracardiac            Opacification Agent Indications:     [id number] Acute Diastolic CHF  History:         Patient has prior history of Echocardiogram  examinations, most                  recent 06/05/2020. Risk Factors:Hypertension and Dyslipidemia.                  Cirrhosis of liver.  Sonographer:     Cresenciano Lick RDCS Referring Phys:  [id number] Jennye Boroughs Diagnosing Phys: Nelva Bush MD IMPRESSIONS  1. Left ventricular ejection fraction, by estimation, is 25 to 30%. The left ventricle has severely decreased function. Left ventricular endocardial border not optimally defined to evaluate regional wall motion. There is mild left ventricular hypertrophy. Left ventricular diastolic parameters are consistent with Grade II diastolic dysfunction (pseudonormalization).  2. Right ventricular systolic function was not well visualized. The right ventricular size is normal. Tricuspid regurgitation signal is inadequate for assessing PA pressure.  3. The mitral valve is abnormal. Trivial mitral valve regurgitation. No evidence of mitral stenosis.  4. The aortic valve has an indeterminant number of cusps. There is mild calcification of the aortic valve. There is mild thickening of the aortic valve. Aortic valve regurgitation is not visualized. Aortic valve sclerosis is present, with no evidence of  aortic valve stenosis.  5. Aortic dilatation noted. There is borderline dilatation of the aortic root, measuring 38 mm. There is mild dilatation of the ascending aorta, measuring 40 mm.  6. The inferior vena cava is normal in size with <50% respiratory variability, suggesting right atrial pressure of 8 mmHg. FINDINGS  Left Ventricle: Left ventricular ejection fraction, by estimation, is 25 to 30%. The left ventricle has severely decreased function. Left ventricular endocardial border not optimally defined to evaluate regional wall motion. Definity contrast agent was given IV to delineate the left ventricular endocardial borders. The left ventricular internal cavity size was normal in size. There is mild left ventricular hypertrophy. Left ventricular diastolic parameters  are consistent with Grade II diastolic dysfunction (pseudonormalization). Right Ventricle: The right ventricular size is normal. No increase in right ventricular wall thickness. Right ventricular systolic function was not well visualized. Tricuspid regurgitation signal is inadequate for assessing PA pressure. Left Atrium: Left atrial size was normal in size. Right Atrium:  Right atrial size was normal in size. Pericardium: The pericardium was not well visualized. Mitral Valve: The mitral valve is abnormal. There is mild calcification of the mitral valve leaflet(s). Trivial mitral valve regurgitation. No evidence of mitral valve stenosis. Tricuspid Valve: The tricuspid valve is not well visualized. Tricuspid valve regurgitation is not demonstrated. Aortic Valve: The aortic valve has an indeterminant number of cusps. There is mild calcification of the aortic valve. There is mild thickening of the aortic valve. Aortic valve regurgitation is not visualized. Aortic valve sclerosis is present, with no evidence of aortic valve stenosis. Pulmonic Valve: The pulmonic valve was not well visualized. Pulmonic valve regurgitation is not visualized. No evidence of pulmonic stenosis. Aorta: Aortic dilatation noted. There is borderline dilatation of the aortic root, measuring 38 mm. There is mild dilatation of the ascending aorta, measuring 40 mm. Pulmonary Artery: The pulmonary artery is not well seen. Venous: The inferior vena cava is normal in size with less than 50% respiratory variability, suggesting right atrial pressure of 8 mmHg. IAS/Shunts: The interatrial septum was not well visualized.  LEFT VENTRICLE PLAX 2D LVIDd:         5.30 cm   Diastology LVIDs:         4.50 cm   LV e' medial:    5.50 cm/s LV PW:         0.80 cm   LV E/e' medial:  14.4 LV IVS:        0.70 cm   LV e' lateral:   6.74 cm/s LVOT diam:     2.10 cm   LV E/e' lateral: 11.8 LV SV:         35 LV SV Index:   15 LVOT Area:     3.46 cm  RIGHT VENTRICLE           IVC RV Basal diam:  3.20 cm  IVC diam: 1.60 cm LEFT ATRIUM             Index        RIGHT ATRIUM           Index LA diam:        4.40 cm 1.89 cm/m   RA Area:     12.70 cm LA Vol (A2C):   62.1 ml 26.61 ml/m  RA Volume:   31.20 ml  13.37 ml/m LA Vol (A4C):   60.0 ml 25.71 ml/m LA Biplane Vol: 62.9 ml 26.95 ml/m  AORTIC VALVE LVOT Vmax:   58.10 cm/s LVOT Vmean:  37.833 cm/s LVOT VTI:    0.100 m  AORTA Ao Root diam: 3.80 cm Ao Asc diam:  4.00 cm MITRAL VALVE MV Area (PHT): 5.07 cm    SHUNTS MV Decel Time: 150 msec    Systemic VTI:  0.10 m MV E velocity: 79.40 cm/s  Systemic Diam: 2.10 cm MV A velocity: 48.50 cm/s MV E/A ratio:  1.64 Harrell Gave End MD Electronically signed by Nelva Bush MD Signature Date/Time: 12/18/2022/7:01:09 AM    Final    CT Angio Chest PE W and/or Wo Contrast  Result Date: 12/16/2022 CLINICAL DATA:  SOB EXAM: CT ANGIOGRAPHY CHEST WITH CONTRAST TECHNIQUE: Multidetector CT imaging of the chest was performed using the standard protocol during bolus administration of intravenous contrast. Multiplanar CT image reconstructions and MIPs were obtained to evaluate the vascular anatomy. RADIATION DOSE REDUCTION: This exam was performed according to the departmental dose-optimization program which includes automated exposure control, adjustment of the mA and/or kV according to patient  size and/or use of iterative reconstruction technique. CONTRAST:  75 mL OMNIPAQUE IOHEXOL 350 MG/ML SOLN COMPARISON:  11/12/2016 FINDINGS: Cardiovascular: Satisfactory opacification of the pulmonary arteries to the segmental level. No evidence of pulmonary embolism. Normal heart size. No pericardial effusion. Atheromatous calcifications noted of coronary arteries. Reflux of contrast into the IVC and hepatic veins consistent with tricuspid insufficiency or right heart strain. Mediastinum/Nodes: No enlarged mediastinal, hilar, or axillary right paratracheal 1.5 cm node is nonspecific. Right prevascular adenopathy  present previously is stable. There is an increase in the number of prominent mediastinal nodes. An AP window node measures 1.1 cm. Thyroid nodularity identified which can be correlated with outpatient ultrasound. Trachea and esophagus unremarkable. Lungs/Pleura: Moderate bilateral pleural effusions. Compressive atelectasis above the effusions. Ground-glass opacity consistent with pneumonitis or pulmonary edema in the aerated lungs. No filling defects in pulmonary arteries to indicate PE. No pneumothorax. Upper Abdomen: No acute abnormality. Musculoskeletal: Thoracic degenerative changes are noted. Review of the MIP images confirms the above findings. IMPRESSION: 1. No evidence of pulmonary embolism. 2. Bilateral pleural effusions. 3. Bibasilar consolidation or volume loss. 4. Ground-glass opacity consistent with pulmonary edema or pneumonitis. 5. Nonspecific mediastinal adenopathy. Electronically Signed   By: Sammie Bench M.D.   On: 12/16/2022 16:41   DG Chest 2 View  Result Date: 12/16/2022 CLINICAL DATA:  Shortness of breath EXAM: CHEST - 2 VIEW COMPARISON:  04/18/2017 FINDINGS: Cardiomegaly. Pulmonary vascular congestion. Small left and trace right pleural effusions. Hazy left basilar airspace opacity. No pneumothorax. IMPRESSION: Cardiomegaly with pulmonary vascular congestion and left greater than right pleural effusions. Electronically Signed   By: Davina Poke D.O.   On: 12/16/2022 14:50      Subjective: Seen and examined on the day of discharge.  Stable, back at baseline  Discharge Exam: Vitals:   12/22/22 1015 12/22/22 1224  BP: (!) 112/93 111/64  Pulse:  77  Resp:  19  Temp:  98 F (36.7 C)  SpO2:  97%   Vitals:   12/22/22 0700 12/22/22 0807 12/22/22 1015 12/22/22 1224  BP:  118/65 (!) 112/93 111/64  Pulse:    77  Resp:  16  19  Temp:  98.4 F (36.9 C)  98 F (36.7 C)  TempSrc:  Oral  Oral  SpO2:  98%  97%  Weight: 103.5 kg     Height:        General: Pt is  alert, awake, not in acute distress Cardiovascular: RRR, S1/S2 +, no rubs, no gallops Respiratory: CTA bilaterally, no wheezing, no rhonchi Abdominal: Soft, NT, ND, bowel sounds + Extremities: 0x5 strength BLE, 0x5 RUE    The results of significant diagnostics from this hospitalization (including imaging, microbiology, ancillary and laboratory) are listed below for reference.     Microbiology: Recent Results (from the past 240 hour(s))  Resp Panel by RT-PCR (Flu A&B, Covid) Anterior Nasal Swab     Status: None   Collection Time: 12/17/22 11:52 PM   Specimen: Anterior Nasal Swab  Result Value Ref Range Status   SARS Coronavirus 2 by RT PCR NEGATIVE NEGATIVE Final    Comment: (NOTE) SARS-CoV-2 target nucleic acids are NOT DETECTED.  The SARS-CoV-2 RNA is generally detectable in upper respiratory specimens during the acute phase of infection. The lowest concentration of SARS-CoV-2 viral copies this assay can detect is 138 copies/mL. A negative result does not preclude SARS-Cov-2 infection and should not be used as the sole basis for treatment or other patient management decisions. A negative result  may occur with  improper specimen collection/handling, submission of specimen other than nasopharyngeal swab, presence of viral mutation(s) within the areas targeted by this assay, and inadequate number of viral copies(<138 copies/mL). A negative result must be combined with clinical observations, patient history, and epidemiological information. The expected result is Negative.  Fact Sheet for Patients:  EntrepreneurPulse.com.au  Fact Sheet for Healthcare Providers:  IncredibleEmployment.be  This test is no t yet approved or cleared by the [place] FDA and  has been authorized for detection and/or diagnosis of SARS-CoV-2 by FDA under an Emergency Use Authorization (EUA). This EUA will remain  in effect (meaning this test can be used) for the  duration of the COVID-19 declaration under Section 564(b)(1) of the Act, 21 U.S.C.section 360bbb-3(b)(1), unless the authorization is terminated  or revoked sooner.       Influenza A by PCR NEGATIVE NEGATIVE Final   Influenza B by PCR NEGATIVE NEGATIVE Final    Comment: (NOTE) The Xpert Xpress SARS-CoV-2/FLU/RSV plus assay is intended as an aid in the diagnosis of influenza from Nasopharyngeal swab specimens and should not be used as a sole basis for treatment. Nasal washings and aspirates are unacceptable for Xpert Xpress SARS-CoV-2/FLU/RSV testing.  Fact Sheet for Patients: EntrepreneurPulse.com.au  Fact Sheet for Healthcare Providers: IncredibleEmployment.be  This test is not yet approved or cleared by the [place] FDA and has been authorized for detection and/or diagnosis of SARS-CoV-2 by FDA under an Emergency Use Authorization (EUA). This EUA will remain in effect (meaning this test can be used) for the duration of the COVID-19 declaration under Section 564(b)(1) of the Act, 21 U.S.C. section 360bbb-3(b)(1), unless the authorization is terminated or revoked.  Performed at [facility], [address] [place], [place] [address]      Labs: BNP (last 3 results) Recent Labs    12/16/22 1316  BNP 123456*   Basic Metabolic Panel: Recent Labs  Lab 12/17/22 0503 12/18/22 0539 12/19/22 0631 12/20/22 0625 12/21/22 0758 12/22/22 0744  NA 141 140 141 143 137 139  K 3.5 3.6 4.3 3.9 3.3* 3.4*  CL 103 107 107 102 105 108  CO2 26 26 25 26 24 24   GLUCOSE 132* 126* 121* 138* 144* 142*  BUN 12 18 24* 23 27* 23  CREATININE 0.82 0.84 0.94 0.82 0.87 0.82  CALCIUM 8.6* 8.3* 8.8* 9.2 8.6* 8.6*  MG 1.8 1.9 1.9  --  2.0 2.0   Liver Function Tests: Recent Labs  Lab 12/22/22 0744  AST 14*  ALT 15  ALKPHOS 70  BILITOT 0.5  PROT 7.4  ALBUMIN 3.3*   No results for input(s): "LIPASE", "AMYLASE" in the last 168 hours. No  results for input(s): "AMMONIA" in the last 168 hours. CBC: Recent Labs  Lab 12/16/22 1316 12/17/22 0503  WBC 10.8* 12.1*  NEUTROABS  --  8.6*  HGB 12.7* 12.1*  HCT 44.0 41.2  MCV 75.0* 75.2*  PLT 363 345   Cardiac Enzymes: No results for input(s): "CKTOTAL", "CKMB", "CKMBINDEX", "TROPONINI" in the last 168 hours. BNP: Invalid input(s): "POCBNP" CBG: No results for input(s): "GLUCAP" in the last 168 hours. D-Dimer No results for input(s): "DDIMER" in the last 72 hours. Hgb A1c No results for input(s): "HGBA1C" in the last 72 hours. Lipid Profile No results for input(s): "CHOL", "HDL", "LDLCALC", "TRIG", "CHOLHDL", "LDLDIRECT" in the last 72 hours. Thyroid function studies No results for input(s): "TSH", "T4TOTAL", "T3FREE", "THYROIDAB" in the last 72 hours.  Invalid input(s): "FREET3" Anemia work up  No results for input(s): "VITAMINB12", "FOLATE", "FERRITIN", "TIBC", "IRON", "RETICCTPCT" in the last 72 hours. Urinalysis    Component Value Date/Time   COLORURINE YELLOW (A) 12/18/2022 0725   APPEARANCEUR HAZY (A) 12/18/2022 0725   APPEARANCEUR Clear 07/18/2016 0927   LABSPEC 1.024 12/18/2022 0725   PHURINE 5.0 12/18/2022 0725   GLUCOSEU NEGATIVE 12/18/2022 0725   HGBUR NEGATIVE 12/18/2022 0725   BILIRUBINUR NEGATIVE 12/18/2022 0725   BILIRUBINUR negative 09/07/2020 1505   BILIRUBINUR Negative 07/18/2016 0927   KETONESUR NEGATIVE 12/18/2022 0725   PROTEINUR 30 (A) 12/18/2022 0725   UROBILINOGEN 0.2 09/07/2020 1505   NITRITE NEGATIVE 12/18/2022 0725   LEUKOCYTESUR NEGATIVE 12/18/2022 0725   Sepsis Labs Recent Labs  Lab 12/16/22 1316 12/17/22 0503  WBC 10.8* 12.1*   Microbiology Recent Results (from the past 240 hour(s))  Resp Panel by RT-PCR (Flu A&B, Covid) Anterior Nasal Swab     Status: None   Collection Time: 12/17/22 11:52 PM   Specimen: Anterior Nasal Swab  Result Value Ref Range Status   SARS Coronavirus 2 by RT PCR NEGATIVE NEGATIVE Final     Comment: (NOTE) SARS-CoV-2 target nucleic acids are NOT DETECTED.  The SARS-CoV-2 RNA is generally detectable in upper respiratory specimens during the acute phase of infection. The lowest concentration of SARS-CoV-2 viral copies this assay can detect is 138 copies/mL. A negative result does not preclude SARS-Cov-2 infection and should not be used as the sole basis for treatment or other patient management decisions. A negative result may occur with  improper specimen collection/handling, submission of specimen other than nasopharyngeal swab, presence of viral mutation(s) within the areas targeted by this assay, and inadequate number of viral copies(<138 copies/mL). A negative result must be combined with clinical observations, patient history, and epidemiological information. The expected result is Negative.  Fact Sheet for Patients:  EntrepreneurPulse.com.au  Fact Sheet for Healthcare Providers:  IncredibleEmployment.be  This test is no t yet approved or cleared by the [place] FDA and  has been authorized for detection and/or diagnosis of SARS-CoV-2 by FDA under an Emergency Use Authorization (EUA). This EUA will remain  in effect (meaning this test can be used) for the duration of the COVID-19 declaration under Section 564(b)(1) of the Act, 21 U.S.C.section 360bbb-3(b)(1), unless the authorization is terminated  or revoked sooner.       Influenza A by PCR NEGATIVE NEGATIVE Final   Influenza B by PCR NEGATIVE NEGATIVE Final    Comment: (NOTE) The Xpert Xpress SARS-CoV-2/FLU/RSV plus assay is intended as an aid in the diagnosis of influenza from Nasopharyngeal swab specimens and should not be used as a sole basis for treatment. Nasal washings and aspirates are unacceptable for Xpert Xpress SARS-CoV-2/FLU/RSV testing.  Fact Sheet for Patients: EntrepreneurPulse.com.au  Fact Sheet for Healthcare  Providers: IncredibleEmployment.be  This test is not yet approved or cleared by the [place] FDA and has been authorized for detection and/or diagnosis of SARS-CoV-2 by FDA under an Emergency Use Authorization (EUA). This EUA will remain in effect (meaning this test can be used) for the duration of the COVID-19 declaration under Section 564(b)(1) of the Act, 21 U.S.C. section 360bbb-3(b)(1), unless the authorization is terminated or revoked.  Performed at [facility], [address] [place], [place] [address]      Time coordinating discharge: Over 30 minutes  SIGNED:   Sidney Ace, MD  [facility] 12/22/2022, 3:37 PM Pager   If 7PM-7AM, please contact night-coverage

## 2022-12-22 NOTE — Progress Notes (Signed)
Cardiology Progress Note   Patient Name: Peter Ryan Date of Encounter: 12/22/2022  Primary Cardiologist: Hebert Soho, DO  Subjective   No c/p, dyspnea, palpitations, or recurrent afib.  Eager to go home.  Wife @ bedside.  Inpatient Medications    Scheduled Meds:  allopurinol  300 mg Oral Daily   amiodarone  200 mg Oral BID   apixaban  5 mg Oral BID   atorvastatin  80 mg Oral Daily   carvedilol  6.25 mg Oral BID WC   cholecalciferol  1,000 Units Oral Daily   cyanocobalamin  1,000 mcg Oral Daily   ferrous sulfate  325 mg Oral QODAY   finasteride  5 mg Oral Daily   furosemide  40 mg Intravenous Once   furosemide  40 mg Oral Daily   potassium chloride  40 mEq Oral BID   sacubitril-valsartan  1 tablet Oral BID   sodium chloride flush  3 mL Intravenous Q12H   spironolactone  25 mg Oral Daily   Continuous Infusions:  sodium chloride     PRN Meds: sodium chloride, acetaminophen, calcium carbonate, ondansetron (ZOFRAN) IV, mouth rinse, polyethylene glycol, sodium chloride flush   Vital Signs    Vitals:   12/22/22 0500 12/22/22 0600 12/22/22 0700 12/22/22 0807  BP: 112/71 113/67  118/65  Pulse: 66 71    Resp: 15 13  16   Temp:    98.4 F (36.9 C)  TempSrc:    Oral  SpO2: 98% 97%  98%  Weight:   103.5 kg   Height:        Intake/Output Summary (Last 24 hours) at 12/22/2022 0947 Last data filed at 12/22/2022 0200 Gross per 24 hour  Intake 322.12 ml  Output 800 ml  Net -477.88 ml   Filed Weights   12/20/22 0500 12/21/22 0517 12/22/22 0700  Weight: 101.4 kg 101.9 kg 103.5 kg    Physical Exam   GEN: Well nourished, well developed, in no acute distress.  HEENT: Grossly normal.  Neck: Supple, no JVD, carotid bruits, or masses. Cardiac: RRR, no murmurs, rubs, or gallops. No clubbing, cyanosis, edema.  Radials 2+, DP/PT 2+ and equal bilaterally.  Respiratory:  Respirations regular and unlabored, clear to auscultation bilaterally. GI: Soft, nontender,  nondistended, BS + x 4. MS: no deformity or atrophy. Skin: warm and dry, no rash. Neuro:  Strength and sensation are intact. Psych: AAOx3.  Normal affect.  Labs    Chemistry Recent Labs  Lab 12/20/22 0625 12/21/22 0758 12/22/22 0744  NA 143 137 139  K 3.9 3.3* 3.4*  CL 102 105 108  CO2 26 24 24   GLUCOSE 138* 144* 142*  BUN 23 27* 23  CREATININE 0.82 0.87 0.82  CALCIUM 9.2 8.6* 8.6*  PROT  --   --  7.4  ALBUMIN  --   --  3.3*  AST  --   --  14*  ALT  --   --  15  ALKPHOS  --   --  70  BILITOT  --   --  0.5  GFRNONAA >60 >60 >60  ANIONGAP 15 8 7      Hematology Recent Labs  Lab 12/16/22 1316 12/17/22 0503  WBC 10.8* 12.1*  RBC 5.87* 5.48  HGB 12.7* 12.1*  HCT 44.0 41.2  MCV 75.0* 75.2*  MCH 21.6* 22.1*  MCHC 28.9* 29.4*  RDW 19.7* 19.4*  PLT 363 345    Cardiac Enzymes  Recent Labs  Lab 12/16/22 1316  TROPONINIHS 13  BNP    Component Value Date/Time   BNP 565.4 (H) 12/16/2022 1316    Lipids  Lab Results  Component Value Date   CHOL 153 12/01/2020   HDL 28 (L) 12/01/2020   LDLCALC 71 12/01/2020   TRIG 270 (H) 12/01/2020   CHOLHDL 5.5 12/01/2020    HbA1c  Lab Results  Component Value Date   HGBA1C 7.3 (H) 02/08/2022    Radiology    No results found.   ECG    RSR, 75, PACs, LAE, inferior infarct, LVH, stable QTc @ 482 - personally reviewed  Telemetry    RSR, freq PACs - Personally Reviewed  Cardiac Studies   2D Echocardiogram 3.19.2024   1. Left ventricular ejection fraction, by estimation, is 25 to 30%. The  left ventricle has severely decreased function. Left ventricular  endocardial border not optimally defined to evaluate regional wall motion.  There is mild left ventricular  hypertrophy. Left ventricular diastolic parameters are consistent with  Grade II diastolic dysfunction (pseudonormalization).   2. Right ventricular systolic function was not well visualized. The right  ventricular size is normal. Tricuspid  regurgitation signal is inadequate  for assessing PA pressure.   3. The mitral valve is abnormal. Trivial mitral valve regurgitation. No  evidence of mitral stenosis.   4. The aortic valve has an indeterminant number of cusps. There is mild  calcification of the aortic valve. There is mild thickening of the aortic  valve. Aortic valve regurgitation is not visualized. Aortic valve  sclerosis is present, with no evidence of   aortic valve stenosis.   5. Aortic dilatation noted. There is borderline dilatation of the aortic  root, measuring 38 mm. There is mild dilatation of the ascending aorta,  measuring 40 mm.   6. The inferior vena cava is normal in size with <50% respiratory  variability, suggesting right atrial pressure of 8 mmHg.  _____________   Patient Profile     72 y.o. male w/ a h/o CVA w/ R sided hemiparesis, liver cirrhosis, HTN, HL, DMII, BPH, non-hodgkins lymphoma, and obesity, who was aditted 3/18 w/ progressive dyspnea and found to have newly dx LV dysfxn and CHF.   Assessment & Plan    1.  Acute HFrEF/presumed ICM: Bed bound at baseline.  Admitted w/ progressive dyspnea  Volume overloaded.  EF 25-30%, grade 2 diast dysfxn.  CT chest from admission w/ significant 3 vessel cor Ca2+.  PO lasix resumed 3/23.  Minus 237 ml yesterday and minus 3.3 L since admission.  Wt up 2.6 kg this AM.  Trace ankle edema.  Exam otw challenging 2/2 body habitus.  Will give 1 dose of lasix 40 IV this AM.  Supp K.  Cont ? blocker, entresto, spiro, and farxiga at current doses.  D/c on lasix 40 PO daily.  He has f/u in CHF clinic next week.  Cath deferred for now.  Will greatly benefit from heart failure @ home program such as [place] - advanced CHF team can arrange as outpt if appropriate.  2.  CAD:  CT chest w/ 3 vessel cor Ca2+.  HsTrop nl on admission.  No chest pain.  Previously discussed w/ pt/family/interventional team/CHF team  Conservative mgmt for now.  Cont statin.  No asa in light  of eliquis rx.  3.  PAF:  developed Afib on 3/22.  Amio/eliquis started (CHA2DS2VASc = 8) and he converted to sinus @ 15:19 on 3/22.  Maintaining sinus this AM.  Asymptomatic when in afib, ? Prior  burden.  Changed to amio 200 bid on 3/23 (h/o prolonged QT - stable this AM).  Look to reduce amio to 200mg  daily in 2 wks.  Cont ? blocker and eliquis.  4.  Liver cirrhosis:  s/p prior paracentesis. LFTs ok.  5.  H/o CVA:  w/ significant R hemiparesis, more or less bed bound.  On eliquis in setting of afib.  6.  Hypokalemia: 3.4 this AM.  Should go home on kdur 80meq bid.  Will need f/u labs @ CHF clinic visit next week.  Signed, Murray Hodgkins, NP  12/22/2022, 9:47 AM    For questions or updates, please contact   Please consult [email or web address] for contact info under Cardiology/STEMI.

## 2022-12-22 NOTE — TOC Transition Note (Signed)
Transition of Care [facility]) - CM/SW Discharge Note   Patient Details  Name: Peter Ryan MRN: [id number] Date of Birth: Jan 25, 1951  [facility] CM/SW Contact:  Izola Price, RN Phone Number: 12/22/2022, 4:42 PM   Clinical Narrative: 3/24: Peter Ryan [facility] for transport just after 5 pm. Discharge to home with [facility] services via [place].  Forms printed and DNR has been signed per Unit RN. Spouse contact number given to [facility].  Simmie Davies RN CM     Final next level of care: [place]     Patient Goals and CMS Choice      Discharge Placement                         Discharge Plan and Services Additional resources added to the After Visit Summary for                  DME Arranged: N/A DME Agency: NA       HH Arranged: OT, Nurse's Aide, PT, RN, Disease Management [place] Agency: [place] ([facility]) Date HH Agency Contacted: 12/21/22 Time [place]: 1136    Social Determinants of Health (SDOH) Interventions SDOH Screenings   Food Insecurity: No Food Insecurity (12/17/2022)  Housing: Low Risk  (12/17/2022)  Transportation Needs: No Transportation Needs (12/17/2022)  Utilities: Not At Risk (12/17/2022)  Alcohol Screen: Low Risk  (10/17/2021)  Depression (PHQ2-9): Low Risk  (10/17/2021)  Financial Resource Strain: Low Risk  (10/17/2021)  Physical Activity: Insufficiently Active (10/17/2021)  Social Connections: Socially Isolated (10/17/2021)  Stress: No Stress Concern Present (10/17/2021)  Tobacco Use: Medium Risk (12/17/2022)     Readmission Risk Interventions     No data to display

## 2022-12-23 NOTE — Progress Notes (Signed)
Patient approved for [facility] Delene Loll) effective 11/20/2022 to 11/20/2023.

## 2022-12-25 DIAGNOSIS — Z5982 Transportation insecurity: Secondary | ICD-10-CM | POA: Diagnosis not present

## 2022-12-25 DIAGNOSIS — E785 Hyperlipidemia, unspecified: Secondary | ICD-10-CM | POA: Diagnosis not present

## 2022-12-25 DIAGNOSIS — E876 Hypokalemia: Secondary | ICD-10-CM | POA: Diagnosis not present

## 2022-12-25 DIAGNOSIS — I420 Dilated cardiomyopathy: Secondary | ICD-10-CM | POA: Diagnosis not present

## 2022-12-25 DIAGNOSIS — I4581 Long QT syndrome: Secondary | ICD-10-CM | POA: Diagnosis not present

## 2022-12-25 DIAGNOSIS — I110 Hypertensive heart disease with heart failure: Secondary | ICD-10-CM | POA: Diagnosis not present

## 2022-12-25 DIAGNOSIS — M1990 Unspecified osteoarthritis, unspecified site: Secondary | ICD-10-CM | POA: Diagnosis not present

## 2022-12-25 DIAGNOSIS — K7460 Unspecified cirrhosis of liver: Secondary | ICD-10-CM | POA: Diagnosis not present

## 2022-12-25 DIAGNOSIS — L409 Psoriasis, unspecified: Secondary | ICD-10-CM | POA: Diagnosis not present

## 2022-12-25 DIAGNOSIS — E669 Obesity, unspecified: Secondary | ICD-10-CM | POA: Diagnosis not present

## 2022-12-25 DIAGNOSIS — Z7901 Long term (current) use of anticoagulants: Secondary | ICD-10-CM | POA: Diagnosis not present

## 2022-12-25 DIAGNOSIS — M109 Gout, unspecified: Secondary | ICD-10-CM | POA: Diagnosis not present

## 2022-12-25 DIAGNOSIS — K219 Gastro-esophageal reflux disease without esophagitis: Secondary | ICD-10-CM | POA: Diagnosis not present

## 2022-12-25 DIAGNOSIS — E1140 Type 2 diabetes mellitus with diabetic neuropathy, unspecified: Secondary | ICD-10-CM | POA: Diagnosis not present

## 2022-12-25 DIAGNOSIS — N401 Enlarged prostate with lower urinary tract symptoms: Secondary | ICD-10-CM | POA: Diagnosis not present

## 2022-12-25 DIAGNOSIS — Z7952 Long term (current) use of systemic steroids: Secondary | ICD-10-CM | POA: Diagnosis not present

## 2022-12-25 DIAGNOSIS — I5043 Acute on chronic combined systolic (congestive) and diastolic (congestive) heart failure: Secondary | ICD-10-CM | POA: Diagnosis not present

## 2022-12-25 DIAGNOSIS — I480 Paroxysmal atrial fibrillation: Secondary | ICD-10-CM | POA: Diagnosis not present

## 2022-12-25 DIAGNOSIS — Z993 Dependence on wheelchair: Secondary | ICD-10-CM | POA: Diagnosis not present

## 2022-12-25 DIAGNOSIS — Z8572 Personal history of non-Hodgkin lymphomas: Secondary | ICD-10-CM | POA: Diagnosis not present

## 2022-12-25 DIAGNOSIS — N50811 Right testicular pain: Secondary | ICD-10-CM | POA: Diagnosis not present

## 2022-12-25 DIAGNOSIS — R350 Frequency of micturition: Secondary | ICD-10-CM | POA: Diagnosis not present

## 2022-12-25 DIAGNOSIS — Z87891 Personal history of nicotine dependence: Secondary | ICD-10-CM | POA: Diagnosis not present

## 2022-12-25 DIAGNOSIS — J90 Pleural effusion, not elsewhere classified: Secondary | ICD-10-CM | POA: Diagnosis not present

## 2022-12-25 DIAGNOSIS — Z556 Problems related to health literacy: Secondary | ICD-10-CM | POA: Diagnosis not present

## 2022-12-27 ENCOUNTER — Encounter: Payer: Medicare Other | Admitting: Family

## 2022-12-27 DIAGNOSIS — I5043 Acute on chronic combined systolic (congestive) and diastolic (congestive) heart failure: Secondary | ICD-10-CM | POA: Diagnosis not present

## 2022-12-27 DIAGNOSIS — I4581 Long QT syndrome: Secondary | ICD-10-CM | POA: Diagnosis not present

## 2022-12-27 DIAGNOSIS — I420 Dilated cardiomyopathy: Secondary | ICD-10-CM | POA: Diagnosis not present

## 2022-12-27 DIAGNOSIS — E1140 Type 2 diabetes mellitus with diabetic neuropathy, unspecified: Secondary | ICD-10-CM | POA: Diagnosis not present

## 2022-12-27 DIAGNOSIS — I480 Paroxysmal atrial fibrillation: Secondary | ICD-10-CM | POA: Diagnosis not present

## 2022-12-27 DIAGNOSIS — I110 Hypertensive heart disease with heart failure: Secondary | ICD-10-CM | POA: Diagnosis not present

## 2022-12-30 DIAGNOSIS — Z7401 Bed confinement status: Secondary | ICD-10-CM | POA: Diagnosis not present

## 2022-12-30 DIAGNOSIS — K5909 Other constipation: Secondary | ICD-10-CM | POA: Diagnosis not present

## 2022-12-30 DIAGNOSIS — I4581 Long QT syndrome: Secondary | ICD-10-CM | POA: Diagnosis not present

## 2022-12-30 DIAGNOSIS — I5043 Acute on chronic combined systolic (congestive) and diastolic (congestive) heart failure: Secondary | ICD-10-CM | POA: Diagnosis not present

## 2022-12-30 DIAGNOSIS — I480 Paroxysmal atrial fibrillation: Secondary | ICD-10-CM | POA: Diagnosis not present

## 2022-12-30 DIAGNOSIS — I69351 Hemiplegia and hemiparesis following cerebral infarction affecting right dominant side: Secondary | ICD-10-CM | POA: Diagnosis not present

## 2022-12-30 DIAGNOSIS — E1140 Type 2 diabetes mellitus with diabetic neuropathy, unspecified: Secondary | ICD-10-CM | POA: Diagnosis not present

## 2022-12-30 DIAGNOSIS — I420 Dilated cardiomyopathy: Secondary | ICD-10-CM | POA: Diagnosis not present

## 2022-12-30 DIAGNOSIS — I110 Hypertensive heart disease with heart failure: Secondary | ICD-10-CM | POA: Diagnosis not present

## 2022-12-30 NOTE — Progress Notes (Deleted)
   Patient ID: Peter Ryan, male    DOB: 1950-10-02, 72 y.o.   MRN: [id number]  HPI  Peter Ryan is a 72 y/o male with a history of  Echo 12/17/22: EF 25-30% along with mild LVH and borderline dilatation of the aortic root, measuring 38 mm. There is mild dilatation of the ascending aorta, measuring 40 mm.   Admitted 12/16/22 due to SOB and pedal edema. Hypoxic and given IV lasix due to new onset HF. Developed new onset AF RVR with conversion to NSR the following day.    He presents today for his initial visit with a chief complaint of   Review of Systems    Physical Exam     Assessment & Plan:  1: Chronic heart failure with reduced ejection fraction- - NYHA class - BNP 12/16/22 was 565.4  2: HTN- - BP - seeing PCP in the home; last seen  - BMP 12/22/22 showed sodium 139, potassium 3.4, creatinine 0.82 & GFR >60  3: DM- - A1c 02/08/22 was 7.3%  4: Stroke-  - bedbound at baseline although can sit in the recliner - saw palliative care 11/22/22  5: Atrial fibrillation-

## 2022-12-31 DIAGNOSIS — I420 Dilated cardiomyopathy: Secondary | ICD-10-CM | POA: Diagnosis not present

## 2022-12-31 DIAGNOSIS — I4581 Long QT syndrome: Secondary | ICD-10-CM | POA: Diagnosis not present

## 2022-12-31 DIAGNOSIS — I480 Paroxysmal atrial fibrillation: Secondary | ICD-10-CM | POA: Diagnosis not present

## 2022-12-31 DIAGNOSIS — I110 Hypertensive heart disease with heart failure: Secondary | ICD-10-CM | POA: Diagnosis not present

## 2022-12-31 DIAGNOSIS — E1140 Type 2 diabetes mellitus with diabetic neuropathy, unspecified: Secondary | ICD-10-CM | POA: Diagnosis not present

## 2022-12-31 DIAGNOSIS — I5043 Acute on chronic combined systolic (congestive) and diastolic (congestive) heart failure: Secondary | ICD-10-CM | POA: Diagnosis not present

## 2023-01-01 ENCOUNTER — Encounter: Payer: Medicare Other | Admitting: Family

## 2023-01-01 ENCOUNTER — Other Ambulatory Visit (HOSPITAL_COMMUNITY): Payer: Self-pay

## 2023-01-01 ENCOUNTER — Telehealth: Payer: Self-pay | Admitting: Family

## 2023-01-01 NOTE — Telephone Encounter (Signed)
Patient did not show for his initial Heart Failure Clinic appointment on 01/01/23.

## 2023-01-02 DIAGNOSIS — I110 Hypertensive heart disease with heart failure: Secondary | ICD-10-CM | POA: Diagnosis not present

## 2023-01-02 DIAGNOSIS — E1140 Type 2 diabetes mellitus with diabetic neuropathy, unspecified: Secondary | ICD-10-CM | POA: Diagnosis not present

## 2023-01-02 DIAGNOSIS — I4581 Long QT syndrome: Secondary | ICD-10-CM | POA: Diagnosis not present

## 2023-01-02 DIAGNOSIS — I5043 Acute on chronic combined systolic (congestive) and diastolic (congestive) heart failure: Secondary | ICD-10-CM | POA: Diagnosis not present

## 2023-01-02 DIAGNOSIS — I420 Dilated cardiomyopathy: Secondary | ICD-10-CM | POA: Diagnosis not present

## 2023-01-02 DIAGNOSIS — I480 Paroxysmal atrial fibrillation: Secondary | ICD-10-CM | POA: Diagnosis not present

## 2023-01-03 DIAGNOSIS — I5043 Acute on chronic combined systolic (congestive) and diastolic (congestive) heart failure: Secondary | ICD-10-CM | POA: Diagnosis not present

## 2023-01-03 DIAGNOSIS — I420 Dilated cardiomyopathy: Secondary | ICD-10-CM | POA: Diagnosis not present

## 2023-01-03 DIAGNOSIS — I110 Hypertensive heart disease with heart failure: Secondary | ICD-10-CM | POA: Diagnosis not present

## 2023-01-03 DIAGNOSIS — E1140 Type 2 diabetes mellitus with diabetic neuropathy, unspecified: Secondary | ICD-10-CM | POA: Diagnosis not present

## 2023-01-03 DIAGNOSIS — I480 Paroxysmal atrial fibrillation: Secondary | ICD-10-CM | POA: Diagnosis not present

## 2023-01-03 DIAGNOSIS — I4581 Long QT syndrome: Secondary | ICD-10-CM | POA: Diagnosis not present

## 2023-01-07 DIAGNOSIS — I110 Hypertensive heart disease with heart failure: Secondary | ICD-10-CM | POA: Diagnosis not present

## 2023-01-07 DIAGNOSIS — I5043 Acute on chronic combined systolic (congestive) and diastolic (congestive) heart failure: Secondary | ICD-10-CM | POA: Diagnosis not present

## 2023-01-07 DIAGNOSIS — I4581 Long QT syndrome: Secondary | ICD-10-CM | POA: Diagnosis not present

## 2023-01-07 DIAGNOSIS — I480 Paroxysmal atrial fibrillation: Secondary | ICD-10-CM | POA: Diagnosis not present

## 2023-01-07 DIAGNOSIS — I420 Dilated cardiomyopathy: Secondary | ICD-10-CM | POA: Diagnosis not present

## 2023-01-07 DIAGNOSIS — E1140 Type 2 diabetes mellitus with diabetic neuropathy, unspecified: Secondary | ICD-10-CM | POA: Diagnosis not present

## 2023-01-08 DIAGNOSIS — I4581 Long QT syndrome: Secondary | ICD-10-CM | POA: Diagnosis not present

## 2023-01-08 DIAGNOSIS — I480 Paroxysmal atrial fibrillation: Secondary | ICD-10-CM | POA: Diagnosis not present

## 2023-01-08 DIAGNOSIS — I110 Hypertensive heart disease with heart failure: Secondary | ICD-10-CM | POA: Diagnosis not present

## 2023-01-08 DIAGNOSIS — I420 Dilated cardiomyopathy: Secondary | ICD-10-CM | POA: Diagnosis not present

## 2023-01-08 DIAGNOSIS — I5043 Acute on chronic combined systolic (congestive) and diastolic (congestive) heart failure: Secondary | ICD-10-CM | POA: Diagnosis not present

## 2023-01-08 DIAGNOSIS — E1140 Type 2 diabetes mellitus with diabetic neuropathy, unspecified: Secondary | ICD-10-CM | POA: Diagnosis not present

## 2023-01-09 DIAGNOSIS — I4581 Long QT syndrome: Secondary | ICD-10-CM | POA: Diagnosis not present

## 2023-01-09 DIAGNOSIS — E1140 Type 2 diabetes mellitus with diabetic neuropathy, unspecified: Secondary | ICD-10-CM | POA: Diagnosis not present

## 2023-01-09 DIAGNOSIS — I480 Paroxysmal atrial fibrillation: Secondary | ICD-10-CM | POA: Diagnosis not present

## 2023-01-09 DIAGNOSIS — I5043 Acute on chronic combined systolic (congestive) and diastolic (congestive) heart failure: Secondary | ICD-10-CM | POA: Diagnosis not present

## 2023-01-09 DIAGNOSIS — I110 Hypertensive heart disease with heart failure: Secondary | ICD-10-CM | POA: Diagnosis not present

## 2023-01-09 DIAGNOSIS — I420 Dilated cardiomyopathy: Secondary | ICD-10-CM | POA: Diagnosis not present

## 2023-01-13 ENCOUNTER — Telehealth: Payer: Self-pay

## 2023-01-13 DIAGNOSIS — I5043 Acute on chronic combined systolic (congestive) and diastolic (congestive) heart failure: Secondary | ICD-10-CM | POA: Diagnosis not present

## 2023-01-13 DIAGNOSIS — I110 Hypertensive heart disease with heart failure: Secondary | ICD-10-CM | POA: Diagnosis not present

## 2023-01-13 DIAGNOSIS — R54 Age-related physical debility: Secondary | ICD-10-CM | POA: Diagnosis not present

## 2023-01-13 DIAGNOSIS — I4581 Long QT syndrome: Secondary | ICD-10-CM | POA: Diagnosis not present

## 2023-01-13 DIAGNOSIS — Z7401 Bed confinement status: Secondary | ICD-10-CM | POA: Diagnosis not present

## 2023-01-13 DIAGNOSIS — I69351 Hemiplegia and hemiparesis following cerebral infarction affecting right dominant side: Secondary | ICD-10-CM | POA: Diagnosis not present

## 2023-01-13 DIAGNOSIS — I480 Paroxysmal atrial fibrillation: Secondary | ICD-10-CM | POA: Diagnosis not present

## 2023-01-13 DIAGNOSIS — I420 Dilated cardiomyopathy: Secondary | ICD-10-CM | POA: Diagnosis not present

## 2023-01-13 DIAGNOSIS — E1140 Type 2 diabetes mellitus with diabetic neuropathy, unspecified: Secondary | ICD-10-CM | POA: Diagnosis not present

## 2023-01-13 DIAGNOSIS — K5909 Other constipation: Secondary | ICD-10-CM | POA: Diagnosis not present

## 2023-01-13 NOTE — Telephone Encounter (Signed)
Attempted to call pt to schedule an appt. Pt did not answer, left vm with clinic phone number.

## 2023-01-14 DIAGNOSIS — I4581 Long QT syndrome: Secondary | ICD-10-CM | POA: Diagnosis not present

## 2023-01-14 DIAGNOSIS — I110 Hypertensive heart disease with heart failure: Secondary | ICD-10-CM | POA: Diagnosis not present

## 2023-01-14 DIAGNOSIS — I420 Dilated cardiomyopathy: Secondary | ICD-10-CM | POA: Diagnosis not present

## 2023-01-14 DIAGNOSIS — E1140 Type 2 diabetes mellitus with diabetic neuropathy, unspecified: Secondary | ICD-10-CM | POA: Diagnosis not present

## 2023-01-14 DIAGNOSIS — I480 Paroxysmal atrial fibrillation: Secondary | ICD-10-CM | POA: Diagnosis not present

## 2023-01-14 DIAGNOSIS — I5043 Acute on chronic combined systolic (congestive) and diastolic (congestive) heart failure: Secondary | ICD-10-CM | POA: Diagnosis not present

## 2023-01-15 DIAGNOSIS — I5043 Acute on chronic combined systolic (congestive) and diastolic (congestive) heart failure: Secondary | ICD-10-CM | POA: Diagnosis not present

## 2023-01-15 DIAGNOSIS — E1140 Type 2 diabetes mellitus with diabetic neuropathy, unspecified: Secondary | ICD-10-CM | POA: Diagnosis not present

## 2023-01-15 DIAGNOSIS — I110 Hypertensive heart disease with heart failure: Secondary | ICD-10-CM | POA: Diagnosis not present

## 2023-01-15 DIAGNOSIS — I480 Paroxysmal atrial fibrillation: Secondary | ICD-10-CM | POA: Diagnosis not present

## 2023-01-15 DIAGNOSIS — I4581 Long QT syndrome: Secondary | ICD-10-CM | POA: Diagnosis not present

## 2023-01-15 DIAGNOSIS — I420 Dilated cardiomyopathy: Secondary | ICD-10-CM | POA: Diagnosis not present

## 2023-01-16 DIAGNOSIS — I480 Paroxysmal atrial fibrillation: Secondary | ICD-10-CM | POA: Diagnosis not present

## 2023-01-16 DIAGNOSIS — I4581 Long QT syndrome: Secondary | ICD-10-CM | POA: Diagnosis not present

## 2023-01-16 DIAGNOSIS — I5043 Acute on chronic combined systolic (congestive) and diastolic (congestive) heart failure: Secondary | ICD-10-CM | POA: Diagnosis not present

## 2023-01-16 DIAGNOSIS — I110 Hypertensive heart disease with heart failure: Secondary | ICD-10-CM | POA: Diagnosis not present

## 2023-01-16 DIAGNOSIS — I420 Dilated cardiomyopathy: Secondary | ICD-10-CM | POA: Diagnosis not present

## 2023-01-16 DIAGNOSIS — E1140 Type 2 diabetes mellitus with diabetic neuropathy, unspecified: Secondary | ICD-10-CM | POA: Diagnosis not present

## 2023-01-20 DIAGNOSIS — I420 Dilated cardiomyopathy: Secondary | ICD-10-CM | POA: Diagnosis not present

## 2023-01-20 DIAGNOSIS — I480 Paroxysmal atrial fibrillation: Secondary | ICD-10-CM | POA: Diagnosis not present

## 2023-01-20 DIAGNOSIS — E1140 Type 2 diabetes mellitus with diabetic neuropathy, unspecified: Secondary | ICD-10-CM | POA: Diagnosis not present

## 2023-01-20 DIAGNOSIS — I110 Hypertensive heart disease with heart failure: Secondary | ICD-10-CM | POA: Diagnosis not present

## 2023-01-20 DIAGNOSIS — I4581 Long QT syndrome: Secondary | ICD-10-CM | POA: Diagnosis not present

## 2023-01-20 DIAGNOSIS — I5043 Acute on chronic combined systolic (congestive) and diastolic (congestive) heart failure: Secondary | ICD-10-CM | POA: Diagnosis not present

## 2023-01-21 DIAGNOSIS — I480 Paroxysmal atrial fibrillation: Secondary | ICD-10-CM | POA: Diagnosis not present

## 2023-01-21 DIAGNOSIS — E1140 Type 2 diabetes mellitus with diabetic neuropathy, unspecified: Secondary | ICD-10-CM | POA: Diagnosis not present

## 2023-01-21 DIAGNOSIS — I5043 Acute on chronic combined systolic (congestive) and diastolic (congestive) heart failure: Secondary | ICD-10-CM | POA: Diagnosis not present

## 2023-01-21 DIAGNOSIS — I4581 Long QT syndrome: Secondary | ICD-10-CM | POA: Diagnosis not present

## 2023-01-21 DIAGNOSIS — I110 Hypertensive heart disease with heart failure: Secondary | ICD-10-CM | POA: Diagnosis not present

## 2023-01-21 DIAGNOSIS — I420 Dilated cardiomyopathy: Secondary | ICD-10-CM | POA: Diagnosis not present

## 2023-01-22 ENCOUNTER — Telehealth: Payer: Self-pay

## 2023-01-22 DIAGNOSIS — E1140 Type 2 diabetes mellitus with diabetic neuropathy, unspecified: Secondary | ICD-10-CM | POA: Diagnosis not present

## 2023-01-22 DIAGNOSIS — I110 Hypertensive heart disease with heart failure: Secondary | ICD-10-CM | POA: Diagnosis not present

## 2023-01-22 DIAGNOSIS — I480 Paroxysmal atrial fibrillation: Secondary | ICD-10-CM | POA: Diagnosis not present

## 2023-01-22 DIAGNOSIS — I4581 Long QT syndrome: Secondary | ICD-10-CM | POA: Diagnosis not present

## 2023-01-22 DIAGNOSIS — I420 Dilated cardiomyopathy: Secondary | ICD-10-CM | POA: Diagnosis not present

## 2023-01-22 DIAGNOSIS — I5043 Acute on chronic combined systolic (congestive) and diastolic (congestive) heart failure: Secondary | ICD-10-CM | POA: Diagnosis not present

## 2023-01-22 NOTE — Telephone Encounter (Signed)
Attempted to call pt to schedule his referral appt. No answer, left vm with call back number.

## 2023-01-23 DIAGNOSIS — E1140 Type 2 diabetes mellitus with diabetic neuropathy, unspecified: Secondary | ICD-10-CM | POA: Diagnosis not present

## 2023-01-23 DIAGNOSIS — I420 Dilated cardiomyopathy: Secondary | ICD-10-CM | POA: Diagnosis not present

## 2023-01-23 DIAGNOSIS — I5043 Acute on chronic combined systolic (congestive) and diastolic (congestive) heart failure: Secondary | ICD-10-CM | POA: Diagnosis not present

## 2023-01-23 DIAGNOSIS — I4581 Long QT syndrome: Secondary | ICD-10-CM | POA: Diagnosis not present

## 2023-01-23 DIAGNOSIS — I110 Hypertensive heart disease with heart failure: Secondary | ICD-10-CM | POA: Diagnosis not present

## 2023-01-23 DIAGNOSIS — I480 Paroxysmal atrial fibrillation: Secondary | ICD-10-CM | POA: Diagnosis not present

## 2023-01-24 DIAGNOSIS — E1140 Type 2 diabetes mellitus with diabetic neuropathy, unspecified: Secondary | ICD-10-CM | POA: Diagnosis not present

## 2023-01-24 DIAGNOSIS — I4581 Long QT syndrome: Secondary | ICD-10-CM | POA: Diagnosis not present

## 2023-01-24 DIAGNOSIS — K7460 Unspecified cirrhosis of liver: Secondary | ICD-10-CM | POA: Diagnosis not present

## 2023-01-24 DIAGNOSIS — I110 Hypertensive heart disease with heart failure: Secondary | ICD-10-CM | POA: Diagnosis not present

## 2023-01-24 DIAGNOSIS — N50811 Right testicular pain: Secondary | ICD-10-CM | POA: Diagnosis not present

## 2023-01-24 DIAGNOSIS — I69351 Hemiplegia and hemiparesis following cerebral infarction affecting right dominant side: Secondary | ICD-10-CM | POA: Diagnosis not present

## 2023-01-24 DIAGNOSIS — L409 Psoriasis, unspecified: Secondary | ICD-10-CM | POA: Diagnosis not present

## 2023-01-24 DIAGNOSIS — M1990 Unspecified osteoarthritis, unspecified site: Secondary | ICD-10-CM | POA: Diagnosis not present

## 2023-01-24 DIAGNOSIS — I5043 Acute on chronic combined systolic (congestive) and diastolic (congestive) heart failure: Secondary | ICD-10-CM | POA: Diagnosis not present

## 2023-01-24 DIAGNOSIS — K219 Gastro-esophageal reflux disease without esophagitis: Secondary | ICD-10-CM | POA: Diagnosis not present

## 2023-01-24 DIAGNOSIS — R350 Frequency of micturition: Secondary | ICD-10-CM | POA: Diagnosis not present

## 2023-01-24 DIAGNOSIS — Z5982 Transportation insecurity: Secondary | ICD-10-CM | POA: Diagnosis not present

## 2023-01-24 DIAGNOSIS — I420 Dilated cardiomyopathy: Secondary | ICD-10-CM | POA: Diagnosis not present

## 2023-01-24 DIAGNOSIS — Z556 Problems related to health literacy: Secondary | ICD-10-CM | POA: Diagnosis not present

## 2023-01-24 DIAGNOSIS — Z993 Dependence on wheelchair: Secondary | ICD-10-CM | POA: Diagnosis not present

## 2023-01-24 DIAGNOSIS — Z7952 Long term (current) use of systemic steroids: Secondary | ICD-10-CM | POA: Diagnosis not present

## 2023-01-24 DIAGNOSIS — E785 Hyperlipidemia, unspecified: Secondary | ICD-10-CM | POA: Diagnosis not present

## 2023-01-24 DIAGNOSIS — Z7901 Long term (current) use of anticoagulants: Secondary | ICD-10-CM | POA: Diagnosis not present

## 2023-01-24 DIAGNOSIS — R54 Age-related physical debility: Secondary | ICD-10-CM | POA: Diagnosis not present

## 2023-01-24 DIAGNOSIS — I480 Paroxysmal atrial fibrillation: Secondary | ICD-10-CM | POA: Diagnosis not present

## 2023-01-24 DIAGNOSIS — E876 Hypokalemia: Secondary | ICD-10-CM | POA: Diagnosis not present

## 2023-01-24 DIAGNOSIS — M153 Secondary multiple arthritis: Secondary | ICD-10-CM | POA: Diagnosis not present

## 2023-01-24 DIAGNOSIS — E669 Obesity, unspecified: Secondary | ICD-10-CM | POA: Diagnosis not present

## 2023-01-24 DIAGNOSIS — N401 Enlarged prostate with lower urinary tract symptoms: Secondary | ICD-10-CM | POA: Diagnosis not present

## 2023-01-24 DIAGNOSIS — Z87891 Personal history of nicotine dependence: Secondary | ICD-10-CM | POA: Diagnosis not present

## 2023-01-24 DIAGNOSIS — J90 Pleural effusion, not elsewhere classified: Secondary | ICD-10-CM | POA: Diagnosis not present

## 2023-01-24 DIAGNOSIS — Z8572 Personal history of non-Hodgkin lymphomas: Secondary | ICD-10-CM | POA: Diagnosis not present

## 2023-01-24 DIAGNOSIS — E119 Type 2 diabetes mellitus without complications: Secondary | ICD-10-CM | POA: Diagnosis not present

## 2023-01-24 DIAGNOSIS — M109 Gout, unspecified: Secondary | ICD-10-CM | POA: Diagnosis not present

## 2023-01-27 DIAGNOSIS — I4581 Long QT syndrome: Secondary | ICD-10-CM | POA: Diagnosis not present

## 2023-01-27 DIAGNOSIS — I480 Paroxysmal atrial fibrillation: Secondary | ICD-10-CM | POA: Diagnosis not present

## 2023-01-27 DIAGNOSIS — E1140 Type 2 diabetes mellitus with diabetic neuropathy, unspecified: Secondary | ICD-10-CM | POA: Diagnosis not present

## 2023-01-27 DIAGNOSIS — I420 Dilated cardiomyopathy: Secondary | ICD-10-CM | POA: Diagnosis not present

## 2023-01-27 DIAGNOSIS — I110 Hypertensive heart disease with heart failure: Secondary | ICD-10-CM | POA: Diagnosis not present

## 2023-01-27 DIAGNOSIS — I5043 Acute on chronic combined systolic (congestive) and diastolic (congestive) heart failure: Secondary | ICD-10-CM | POA: Diagnosis not present

## 2023-02-20 DIAGNOSIS — Z7401 Bed confinement status: Secondary | ICD-10-CM | POA: Diagnosis not present

## 2023-02-20 DIAGNOSIS — K5909 Other constipation: Secondary | ICD-10-CM | POA: Diagnosis not present

## 2023-02-20 DIAGNOSIS — I5043 Acute on chronic combined systolic (congestive) and diastolic (congestive) heart failure: Secondary | ICD-10-CM | POA: Diagnosis not present

## 2023-02-20 DIAGNOSIS — R54 Age-related physical debility: Secondary | ICD-10-CM | POA: Diagnosis not present

## 2023-02-20 DIAGNOSIS — I480 Paroxysmal atrial fibrillation: Secondary | ICD-10-CM | POA: Diagnosis not present

## 2023-03-20 DIAGNOSIS — R112 Nausea with vomiting, unspecified: Secondary | ICD-10-CM | POA: Diagnosis not present

## 2023-03-20 DIAGNOSIS — I5043 Acute on chronic combined systolic (congestive) and diastolic (congestive) heart failure: Secondary | ICD-10-CM | POA: Diagnosis not present

## 2023-03-20 DIAGNOSIS — R54 Age-related physical debility: Secondary | ICD-10-CM | POA: Diagnosis not present

## 2023-03-20 DIAGNOSIS — K5909 Other constipation: Secondary | ICD-10-CM | POA: Diagnosis not present

## 2023-03-20 DIAGNOSIS — Z7401 Bed confinement status: Secondary | ICD-10-CM | POA: Diagnosis not present

## 2023-03-20 DIAGNOSIS — I110 Hypertensive heart disease with heart failure: Secondary | ICD-10-CM | POA: Diagnosis not present

## 2023-03-20 DIAGNOSIS — I480 Paroxysmal atrial fibrillation: Secondary | ICD-10-CM | POA: Diagnosis not present

## 2023-03-29 DIAGNOSIS — E119 Type 2 diabetes mellitus without complications: Secondary | ICD-10-CM | POA: Diagnosis not present

## 2023-03-29 DIAGNOSIS — I69351 Hemiplegia and hemiparesis following cerebral infarction affecting right dominant side: Secondary | ICD-10-CM | POA: Diagnosis not present

## 2023-03-29 DIAGNOSIS — I480 Paroxysmal atrial fibrillation: Secondary | ICD-10-CM | POA: Diagnosis not present

## 2023-03-29 DIAGNOSIS — M153 Secondary multiple arthritis: Secondary | ICD-10-CM | POA: Diagnosis not present

## 2023-03-29 DIAGNOSIS — R54 Age-related physical debility: Secondary | ICD-10-CM | POA: Diagnosis not present

## 2023-03-29 DIAGNOSIS — N401 Enlarged prostate with lower urinary tract symptoms: Secondary | ICD-10-CM | POA: Diagnosis not present

## 2023-03-29 DIAGNOSIS — I110 Hypertensive heart disease with heart failure: Secondary | ICD-10-CM | POA: Diagnosis not present

## 2023-03-29 DIAGNOSIS — I5043 Acute on chronic combined systolic (congestive) and diastolic (congestive) heart failure: Secondary | ICD-10-CM | POA: Diagnosis not present

## 2023-04-24 DIAGNOSIS — I480 Paroxysmal atrial fibrillation: Secondary | ICD-10-CM | POA: Diagnosis not present

## 2023-04-24 DIAGNOSIS — I5043 Acute on chronic combined systolic (congestive) and diastolic (congestive) heart failure: Secondary | ICD-10-CM | POA: Diagnosis not present

## 2023-04-24 DIAGNOSIS — R030 Elevated blood-pressure reading, without diagnosis of hypertension: Secondary | ICD-10-CM | POA: Diagnosis not present

## 2023-04-24 DIAGNOSIS — Z7401 Bed confinement status: Secondary | ICD-10-CM | POA: Diagnosis not present

## 2023-04-24 DIAGNOSIS — K5909 Other constipation: Secondary | ICD-10-CM | POA: Diagnosis not present

## 2023-04-24 DIAGNOSIS — R54 Age-related physical debility: Secondary | ICD-10-CM | POA: Diagnosis not present

## 2023-05-29 DIAGNOSIS — M153 Secondary multiple arthritis: Secondary | ICD-10-CM | POA: Diagnosis not present

## 2023-05-29 DIAGNOSIS — I5043 Acute on chronic combined systolic (congestive) and diastolic (congestive) heart failure: Secondary | ICD-10-CM | POA: Diagnosis not present

## 2023-05-29 DIAGNOSIS — I480 Paroxysmal atrial fibrillation: Secondary | ICD-10-CM | POA: Diagnosis not present

## 2023-05-29 DIAGNOSIS — R54 Age-related physical debility: Secondary | ICD-10-CM | POA: Diagnosis not present

## 2023-05-29 DIAGNOSIS — E119 Type 2 diabetes mellitus without complications: Secondary | ICD-10-CM | POA: Diagnosis not present

## 2023-05-29 DIAGNOSIS — I69351 Hemiplegia and hemiparesis following cerebral infarction affecting right dominant side: Secondary | ICD-10-CM | POA: Diagnosis not present

## 2023-05-29 DIAGNOSIS — I110 Hypertensive heart disease with heart failure: Secondary | ICD-10-CM | POA: Diagnosis not present

## 2023-06-05 DIAGNOSIS — I5043 Acute on chronic combined systolic (congestive) and diastolic (congestive) heart failure: Secondary | ICD-10-CM | POA: Diagnosis not present

## 2023-06-05 DIAGNOSIS — I480 Paroxysmal atrial fibrillation: Secondary | ICD-10-CM | POA: Diagnosis not present

## 2023-06-05 DIAGNOSIS — R112 Nausea with vomiting, unspecified: Secondary | ICD-10-CM | POA: Diagnosis not present

## 2023-06-05 DIAGNOSIS — R54 Age-related physical debility: Secondary | ICD-10-CM | POA: Diagnosis not present

## 2023-06-05 DIAGNOSIS — Z7401 Bed confinement status: Secondary | ICD-10-CM | POA: Diagnosis not present

## 2023-06-05 DIAGNOSIS — K5909 Other constipation: Secondary | ICD-10-CM | POA: Diagnosis not present

## 2023-06-05 DIAGNOSIS — I110 Hypertensive heart disease with heart failure: Secondary | ICD-10-CM | POA: Diagnosis not present

## 2023-06-24 DIAGNOSIS — I480 Paroxysmal atrial fibrillation: Secondary | ICD-10-CM | POA: Diagnosis not present

## 2023-06-24 DIAGNOSIS — M153 Secondary multiple arthritis: Secondary | ICD-10-CM | POA: Diagnosis not present

## 2023-06-24 DIAGNOSIS — I5043 Acute on chronic combined systolic (congestive) and diastolic (congestive) heart failure: Secondary | ICD-10-CM | POA: Diagnosis not present

## 2023-06-24 DIAGNOSIS — I69351 Hemiplegia and hemiparesis following cerebral infarction affecting right dominant side: Secondary | ICD-10-CM | POA: Diagnosis not present

## 2023-06-24 DIAGNOSIS — R54 Age-related physical debility: Secondary | ICD-10-CM | POA: Diagnosis not present

## 2023-06-24 DIAGNOSIS — E119 Type 2 diabetes mellitus without complications: Secondary | ICD-10-CM | POA: Diagnosis not present

## 2023-06-24 DIAGNOSIS — I110 Hypertensive heart disease with heart failure: Secondary | ICD-10-CM | POA: Diagnosis not present

## 2023-07-03 DIAGNOSIS — I110 Hypertensive heart disease with heart failure: Secondary | ICD-10-CM | POA: Diagnosis not present

## 2023-07-03 DIAGNOSIS — I5043 Acute on chronic combined systolic (congestive) and diastolic (congestive) heart failure: Secondary | ICD-10-CM | POA: Diagnosis not present

## 2023-07-03 DIAGNOSIS — N401 Enlarged prostate with lower urinary tract symptoms: Secondary | ICD-10-CM | POA: Diagnosis not present

## 2023-07-03 DIAGNOSIS — Z0001 Encounter for general adult medical examination with abnormal findings: Secondary | ICD-10-CM | POA: Diagnosis not present

## 2023-07-03 DIAGNOSIS — I69351 Hemiplegia and hemiparesis following cerebral infarction affecting right dominant side: Secondary | ICD-10-CM | POA: Diagnosis not present

## 2023-07-30 DIAGNOSIS — R54 Age-related physical debility: Secondary | ICD-10-CM | POA: Diagnosis not present

## 2023-07-30 DIAGNOSIS — E119 Type 2 diabetes mellitus without complications: Secondary | ICD-10-CM | POA: Diagnosis not present

## 2023-07-30 DIAGNOSIS — I110 Hypertensive heart disease with heart failure: Secondary | ICD-10-CM | POA: Diagnosis not present

## 2023-07-30 DIAGNOSIS — I480 Paroxysmal atrial fibrillation: Secondary | ICD-10-CM | POA: Diagnosis not present

## 2023-07-30 DIAGNOSIS — I69351 Hemiplegia and hemiparesis following cerebral infarction affecting right dominant side: Secondary | ICD-10-CM | POA: Diagnosis not present

## 2023-07-30 DIAGNOSIS — I5043 Acute on chronic combined systolic (congestive) and diastolic (congestive) heart failure: Secondary | ICD-10-CM | POA: Diagnosis not present

## 2023-07-30 DIAGNOSIS — E785 Hyperlipidemia, unspecified: Secondary | ICD-10-CM | POA: Diagnosis not present

## 2023-07-30 DIAGNOSIS — M153 Secondary multiple arthritis: Secondary | ICD-10-CM | POA: Diagnosis not present

## 2023-07-31 DIAGNOSIS — R54 Age-related physical debility: Secondary | ICD-10-CM | POA: Diagnosis not present

## 2023-07-31 DIAGNOSIS — Z23 Encounter for immunization: Secondary | ICD-10-CM | POA: Diagnosis not present

## 2023-07-31 DIAGNOSIS — I5043 Acute on chronic combined systolic (congestive) and diastolic (congestive) heart failure: Secondary | ICD-10-CM | POA: Diagnosis not present

## 2023-07-31 DIAGNOSIS — I69351 Hemiplegia and hemiparesis following cerebral infarction affecting right dominant side: Secondary | ICD-10-CM | POA: Diagnosis not present

## 2023-09-15 DIAGNOSIS — I5043 Acute on chronic combined systolic (congestive) and diastolic (congestive) heart failure: Secondary | ICD-10-CM | POA: Diagnosis not present

## 2023-09-15 DIAGNOSIS — R54 Age-related physical debility: Secondary | ICD-10-CM | POA: Diagnosis not present

## 2023-09-15 DIAGNOSIS — I480 Paroxysmal atrial fibrillation: Secondary | ICD-10-CM | POA: Diagnosis not present

## 2023-09-15 DIAGNOSIS — E119 Type 2 diabetes mellitus without complications: Secondary | ICD-10-CM | POA: Diagnosis not present

## 2023-09-15 DIAGNOSIS — I110 Hypertensive heart disease with heart failure: Secondary | ICD-10-CM | POA: Diagnosis not present

## 2023-09-15 DIAGNOSIS — I69351 Hemiplegia and hemiparesis following cerebral infarction affecting right dominant side: Secondary | ICD-10-CM | POA: Diagnosis not present

## 2023-09-15 DIAGNOSIS — M153 Secondary multiple arthritis: Secondary | ICD-10-CM | POA: Diagnosis not present

## 2023-09-15 DIAGNOSIS — E785 Hyperlipidemia, unspecified: Secondary | ICD-10-CM | POA: Diagnosis not present

## 2023-09-18 DIAGNOSIS — I69351 Hemiplegia and hemiparesis following cerebral infarction affecting right dominant side: Secondary | ICD-10-CM | POA: Diagnosis not present

## 2023-09-18 DIAGNOSIS — I5043 Acute on chronic combined systolic (congestive) and diastolic (congestive) heart failure: Secondary | ICD-10-CM | POA: Diagnosis not present

## 2023-09-18 DIAGNOSIS — R54 Age-related physical debility: Secondary | ICD-10-CM | POA: Diagnosis not present

## 2023-11-13 ENCOUNTER — Emergency Department: Payer: Medicare Other

## 2023-11-13 ENCOUNTER — Other Ambulatory Visit: Payer: Self-pay

## 2023-11-13 ENCOUNTER — Inpatient Hospital Stay
Admission: EM | Admit: 2023-11-13 | Discharge: 2023-11-20 | DRG: 067 | Disposition: A | Discharge status: Skilled Nursing Facility | Payer: Medicare Other | Attending: Internal Medicine | Admitting: Internal Medicine

## 2023-11-13 DIAGNOSIS — R4701 Aphasia: Secondary | ICD-10-CM | POA: Diagnosis not present

## 2023-11-13 DIAGNOSIS — L89312 Pressure ulcer of right buttock, stage 2: Secondary | ICD-10-CM | POA: Diagnosis present

## 2023-11-13 DIAGNOSIS — L8990 Pressure ulcer of unspecified site, unspecified stage: Secondary | ICD-10-CM | POA: Insufficient documentation

## 2023-11-13 DIAGNOSIS — E876 Hypokalemia: Secondary | ICD-10-CM | POA: Diagnosis not present

## 2023-11-13 DIAGNOSIS — T502X5A Adverse effect of carbonic-anhydrase inhibitors, benzothiadiazides and other diuretics, initial encounter: Secondary | ICD-10-CM | POA: Diagnosis not present

## 2023-11-13 DIAGNOSIS — L89322 Pressure ulcer of left buttock, stage 2: Secondary | ICD-10-CM | POA: Diagnosis present

## 2023-11-13 DIAGNOSIS — I6930 Unspecified sequelae of cerebral infarction: Secondary | ICD-10-CM

## 2023-11-13 DIAGNOSIS — Z8049 Family history of malignant neoplasm of other genital organs: Secondary | ICD-10-CM

## 2023-11-13 DIAGNOSIS — I509 Heart failure, unspecified: Secondary | ICD-10-CM

## 2023-11-13 DIAGNOSIS — N411 Chronic prostatitis: Secondary | ICD-10-CM | POA: Diagnosis present

## 2023-11-13 DIAGNOSIS — Z888 Allergy status to other drugs, medicaments and biological substances status: Secondary | ICD-10-CM

## 2023-11-13 DIAGNOSIS — I480 Paroxysmal atrial fibrillation: Secondary | ICD-10-CM | POA: Diagnosis present

## 2023-11-13 DIAGNOSIS — M109 Gout, unspecified: Secondary | ICD-10-CM | POA: Diagnosis present

## 2023-11-13 DIAGNOSIS — I6502 Occlusion and stenosis of left vertebral artery: Principal | ICD-10-CM | POA: Diagnosis present

## 2023-11-13 DIAGNOSIS — I5023 Acute on chronic systolic (congestive) heart failure: Secondary | ICD-10-CM | POA: Diagnosis not present

## 2023-11-13 DIAGNOSIS — Z7901 Long term (current) use of anticoagulants: Secondary | ICD-10-CM

## 2023-11-13 DIAGNOSIS — K7460 Unspecified cirrhosis of liver: Secondary | ICD-10-CM | POA: Diagnosis present

## 2023-11-13 DIAGNOSIS — Z7401 Bed confinement status: Secondary | ICD-10-CM

## 2023-11-13 DIAGNOSIS — N400 Enlarged prostate without lower urinary tract symptoms: Secondary | ICD-10-CM | POA: Diagnosis present

## 2023-11-13 DIAGNOSIS — E785 Hyperlipidemia, unspecified: Secondary | ICD-10-CM | POA: Diagnosis present

## 2023-11-13 DIAGNOSIS — Z751 Person awaiting admission to adequate facility elsewhere: Secondary | ICD-10-CM

## 2023-11-13 DIAGNOSIS — Z66 Do not resuscitate: Secondary | ICD-10-CM | POA: Diagnosis present

## 2023-11-13 DIAGNOSIS — I69351 Hemiplegia and hemiparesis following cerebral infarction affecting right dominant side: Secondary | ICD-10-CM

## 2023-11-13 DIAGNOSIS — Z87891 Personal history of nicotine dependence: Secondary | ICD-10-CM

## 2023-11-13 DIAGNOSIS — R600 Localized edema: Secondary | ICD-10-CM

## 2023-11-13 DIAGNOSIS — Z79899 Other long term (current) drug therapy: Secondary | ICD-10-CM

## 2023-11-13 DIAGNOSIS — G459 Transient cerebral ischemic attack, unspecified: Secondary | ICD-10-CM | POA: Diagnosis present

## 2023-11-13 DIAGNOSIS — E1165 Type 2 diabetes mellitus with hyperglycemia: Secondary | ICD-10-CM | POA: Diagnosis present

## 2023-11-13 DIAGNOSIS — Z833 Family history of diabetes mellitus: Secondary | ICD-10-CM

## 2023-11-13 DIAGNOSIS — K219 Gastro-esophageal reflux disease without esophagitis: Secondary | ICD-10-CM | POA: Diagnosis present

## 2023-11-13 DIAGNOSIS — L299 Pruritus, unspecified: Secondary | ICD-10-CM | POA: Diagnosis not present

## 2023-11-13 DIAGNOSIS — R0682 Tachypnea, not elsewhere classified: Secondary | ICD-10-CM | POA: Diagnosis not present

## 2023-11-13 DIAGNOSIS — R4781 Slurred speech: Principal | ICD-10-CM

## 2023-11-13 DIAGNOSIS — I110 Hypertensive heart disease with heart failure: Secondary | ICD-10-CM | POA: Diagnosis present

## 2023-11-13 DIAGNOSIS — R54 Age-related physical debility: Secondary | ICD-10-CM | POA: Diagnosis present

## 2023-11-13 DIAGNOSIS — C8333 Diffuse large B-cell lymphoma, intra-abdominal lymph nodes: Secondary | ICD-10-CM | POA: Diagnosis present

## 2023-11-13 DIAGNOSIS — I69328 Other speech and language deficits following cerebral infarction: Secondary | ICD-10-CM

## 2023-11-13 DIAGNOSIS — Z794 Long term (current) use of insulin: Secondary | ICD-10-CM

## 2023-11-13 DIAGNOSIS — I5022 Chronic systolic (congestive) heart failure: Secondary | ICD-10-CM

## 2023-11-13 LAB — COMPREHENSIVE METABOLIC PANEL
ALT: 15 U/L (ref 0–44)
AST: 27 U/L (ref 15–41)
Albumin: 3.8 g/dL (ref 3.5–5.0)
Alkaline Phosphatase: 63 U/L (ref 38–126)
Anion gap: 13 (ref 5–15)
BUN: 10 mg/dL (ref 8–23)
CO2: 25 mmol/L (ref 22–32)
Calcium: 9 mg/dL (ref 8.9–10.3)
Chloride: 101 mmol/L (ref 98–111)
Creatinine, Ser: 0.69 mg/dL (ref 0.61–1.24)
GFR, Estimated: >60 mL/min (ref >60)
Glucose, Bld: 253 mg/dL — ABNORMAL HIGH (ref 70–99)
Potassium: 3.7 mmol/L (ref 3.5–5.1)
Sodium: 139 mmol/L (ref 135–145)
Total Bilirubin: 0.5 mg/dL (ref 0.0–1.2)
Total Protein: 8.1 g/dL (ref 6.5–8.1)

## 2023-11-13 LAB — DIFFERENTIAL
Abs Immature Granulocytes: 0.04 10*3/uL (ref 0.00–0.07)
Basophils Absolute: 0.1 10*3/uL (ref 0.0–0.1)
Basophils Relative: 1 %
Eosinophils Absolute: 0.2 10*3/uL (ref 0.0–0.5)
Eosinophils Relative: 2 %
Immature Granulocytes: 0 %
Lymphocytes Relative: 19 %
Lymphs Abs: 1.7 10*3/uL (ref 0.7–4.0)
Monocytes Absolute: 0.6 10*3/uL (ref 0.1–1.0)
Monocytes Relative: 7 %
Neutro Abs: 6.4 10*3/uL (ref 1.7–7.7)
Neutrophils Relative %: 71 %

## 2023-11-13 LAB — CBC
HCT: 47.9 % (ref 39.0–52.0)
Hemoglobin: 15.8 g/dL (ref 13.0–17.0)
MCH: 28.7 pg (ref 26.0–34.0)
MCHC: 33 g/dL (ref 30.0–36.0)
MCV: 87.1 fL (ref 80.0–100.0)
Platelets: 262 10*3/uL (ref 150–400)
RBC: 5.5 MIL/uL (ref 4.22–5.81)
RDW: 14.7 % (ref 11.5–15.5)
WBC: 9.1 10*3/uL (ref 4.0–10.5)
nRBC: 0 % (ref 0.0–0.2)

## 2023-11-13 LAB — PROTIME-INR
INR: 1 (ref 0.8–1.2)
Prothrombin Time: 13.2 s (ref 11.4–15.2)

## 2023-11-13 LAB — APTT: aPTT: 31 s (ref 24–36)

## 2023-11-13 LAB — BRAIN NATRIURETIC PEPTIDE: B Natriuretic Peptide: 480.8 pg/mL — ABNORMAL HIGH (ref 0.0–100.0)

## 2023-11-13 LAB — ETHANOL: Alcohol, Ethyl (B): <10 mg/dL (ref <10)

## 2023-11-13 MED ORDER — LORAZEPAM 1 MG PO TABS
1.0000 mg | ORAL_TABLET | Freq: Once | ORAL | Status: AC
  Administered 2023-11-13: 1 mg via ORAL
  Filled 2023-11-13: qty 1

## 2023-11-13 NOTE — ED Notes (Signed)
First Nurse Note: Pt to ED via [facility] from home for slurred speech and left sided weakness. Slurred speech has been going on for the last month on and off., it is unknown when the left sided weakness started. Unknown when his LKW was. Pt has hx/o CVA with right sided deficits.   BP- 180/112 SpO2- 94% HR- 94 CBG 348

## 2023-11-13 NOTE — ED Notes (Addendum)
Pt provided privacy curtain. Wife at bedside. Wife reporting is unable to care for pt at home and wanting to request SW for possible placement options.

## 2023-11-13 NOTE — ED Triage Notes (Signed)
See first nurse note: pt reports slurred speech for a few weeks and caregiver wanted him checked out. Alert and oriented. Pt denies weakness.

## 2023-11-13 NOTE — ED Notes (Signed)
Pt transported to [place]

## 2023-11-13 NOTE — ED Notes (Signed)
Pt transported to MRI

## 2023-11-13 NOTE — ED Provider Notes (Signed)
[facility] Provider Note    Event Date/Time   First MD Initiated Contact with Patient 11/13/23 1512     (approximate)   History   Aphasia   HPI  Peter Ryan is a 73 y.o. male who presents to the emergency department today because of concerns for aphasia and lower extremity swelling.  Concerns were raised by home health provider that visited the patient today.  Per family she comes once a month.  When she came today she felt like her speech had changed drastically.  Family states they have noticed some change in his speech over the past couple weeks.  It does seem to be worse in the mornings.  Patient has a history of strokes so they are concerned he might of had another stroke.  In addition they have noticed some increasing swelling in his right lower leg.  He denies any pain.  Patient is not on any blood thinners.     Physical Exam   Triage Vital Signs: ED Triage Vitals  Encounter Vitals Group     BP 11/13/23 1221 (!) 161/101     Systolic BP Percentile --      Diastolic BP Percentile --      Pulse Rate 11/13/23 1221 91     Resp 11/13/23 1221 20     Temp 11/13/23 1221 98.3 F (36.8 C)     Temp src --      SpO2 11/13/23 1221 95 %     Weight 11/13/23 1222 227 lb 1.2 oz (103 kg)     Height 11/13/23 1222 5\' 10"  (1.778 m)     Head Circumference --      Peak Flow --      Pain Score 11/13/23 1222 0     Pain Loc --      Pain Education --      Exclude from Growth Chart --     Most recent vital signs: Vitals:   11/13/23 1221  BP: (!) 161/101  Pulse: 91  Resp: 20  Temp: 98.3 F (36.8 C)  SpO2: 95%   General: Awake, alert, oriented. CV:  Good peripheral perfusion. Regular rate and rhythm. Resp:  Normal effort. Lungs clear. Abd:  No distention.  Other:  Slight edema to the right lower extremity. No erythema. Non tender.   ED Results / Procedures / Treatments   Labs (all labs ordered are listed, but only abnormal results are  displayed) Labs Reviewed  COMPREHENSIVE METABOLIC PANEL - Abnormal; Notable for the following components:      Result Value   Glucose, Bld 253 (*)    All other components within normal limits  BRAIN NATRIURETIC PEPTIDE - Abnormal; Notable for the following components:   B Natriuretic Peptide 480.8 (*)    All other components within normal limits  PROTIME-INR  APTT  CBC  DIFFERENTIAL  ETHANOL     EKG  I, Phineas Semen, attending physician, personally viewed and interpreted this EKG  EKG Time: 1223 Rate: 92 Rhythm: normal sinus rhythm Axis: normal Intervals: qtc 514 QRS: narrow, q waves v1, v2, v3 ST changes: no st elevation Impression: abnormal ekg   RADIOLOGY I independently interpreted and visualized the CT head. My interpretation: No ICH Radiology interpretation:  IMPRESSION:  1. No evidence of acute intracranial abnormality.  2. Severe chronic small vessel ischemic disease.    [place] RLE Radiology interpretation: IMPRESSION:  Negative.   MR Brain IMPRESSION:  1. No evidence of an acute or recent  subacute infarction.  2. Parenchymal atrophy, advanced chronic small vessel ischemic  disease and chronic infarcts, as described. A chronic infarct within  the left ventral aspect of the medulla is new from the prior MRI of  11/30/2020.  3. Also new from the prior MRI, there is signal abnormality within  the left vertebral artery at the V3/V4 junction, which may reflect  high-grade stenosis or vessel occlusion. Consider CT or MR  angiography for further evaluation.  4. Small chronic hemorrhage within the right external capsule.  5. Chronic microhemorrhages within the bilateral thalami, likely  reflecting sequelae of chronic hypertensive microangiopathy.    PROCEDURES:  Critical Care performed: No   MEDICATIONS ORDERED IN ED: Medications - No data to display   IMPRESSION / MDM / ASSESSMENT AND PLAN / ED COURSE  I reviewed the triage vital signs and the  nursing notes.                              Differential diagnosis includes, but is not limited to, TIA, CVA, anemia, electrolyte abnormality, DVT, CHF  Patient's presentation is most consistent with acute presentation with potential threat to life or bodily function.   Patient presented to the emergency department today with concerns for both slurred speech as well as lower extremity edema.  On exam patient's speech does not appear to be particularly slurred.  He does have some slight edema to his right lower extremity.  CT head without any acute abnormality.  Will check MRI to evaluate for CVA.  Additionally will get ultrasound and BNP to evaluate right lower extremity edema.  Ultrasound did not show any acute blood clot.  BNP is slightly elevated.  I do think this likely explains the swelling.  MRI did not show a CVA but did raise question for occlusion versus stenosis of vertebral artery.  Will obtain CT angio to further evaluate.   Apparently family also has concerns about being able to care for patient.  It does appear that they would like social work involved the patient was not to be admitted to the hospital.   FINAL CLINICAL IMPRESSION(S) / ED DIAGNOSES   Final diagnoses:  Slurred speech  Lower extremity edema      Note:  This document was prepared using Dragon voice recognition software and may include unintentional dictation errors.    Phineas Semen, MD 11/13/23 [phone]

## 2023-11-13 NOTE — ED Notes (Signed)
Pt with soiled brief and clothes. Pt cleaned. New brief in place and linens changed.

## 2023-11-14 ENCOUNTER — Observation Stay: Admit: 2023-11-14 | Payer: Medicare Other

## 2023-11-14 ENCOUNTER — Emergency Department: Payer: Medicare Other

## 2023-11-14 ENCOUNTER — Encounter: Payer: Self-pay | Admitting: Internal Medicine

## 2023-11-14 DIAGNOSIS — G459 Transient cerebral ischemic attack, unspecified: Secondary | ICD-10-CM | POA: Diagnosis not present

## 2023-11-14 DIAGNOSIS — I5022 Chronic systolic (congestive) heart failure: Secondary | ICD-10-CM

## 2023-11-14 LAB — HEMOGLOBIN A1C
Hgb A1c MFr Bld: 8.2 % — ABNORMAL HIGH (ref 4.8–5.6)
Mean Plasma Glucose: 188.64 mg/dL

## 2023-11-14 LAB — CBG MONITORING, ED
Glucose-Capillary: 199 mg/dL — ABNORMAL HIGH (ref 70–99)
Glucose-Capillary: 214 mg/dL — ABNORMAL HIGH (ref 70–99)

## 2023-11-14 LAB — GLUCOSE, CAPILLARY
Glucose-Capillary: 197 mg/dL — ABNORMAL HIGH (ref 70–99)
Glucose-Capillary: 235 mg/dL — ABNORMAL HIGH (ref 70–99)

## 2023-11-14 LAB — LIPID PANEL
Cholesterol: 139 mg/dL (ref 0–200)
HDL: 26 mg/dL — ABNORMAL LOW (ref >40)
LDL Cholesterol: 61 mg/dL (ref 0–99)
Total CHOL/HDL Ratio: 5.3 {ratio}
Triglycerides: 260 mg/dL — ABNORMAL HIGH (ref <150)
VLDL: 52 mg/dL — ABNORMAL HIGH (ref 0–40)

## 2023-11-14 MED ORDER — ACETAMINOPHEN 325 MG PO TABS: 650.0000 mg | ORAL_TABLET | ORAL | Status: DC | PRN

## 2023-11-14 MED ORDER — HYDRALAZINE HCL 20 MG/ML IJ SOLN: 5.0000 mg | Freq: Four times a day (QID) | INTRAMUSCULAR | Status: DC | PRN

## 2023-11-14 MED ORDER — FUROSEMIDE 40 MG PO TABS
40.0000 mg | ORAL_TABLET | Freq: Every day | ORAL | Status: DC
  Administered 2023-11-14 – 2023-11-16 (×3): 40 mg via ORAL
  Filled 2023-11-14 (×3): qty 1

## 2023-11-14 MED ORDER — INSULIN ASPART 100 UNIT/ML IJ SOLN
0.0000 [IU] | Freq: Every day | INTRAMUSCULAR | Status: DC
Start: 2023-11-14 — End: 2023-11-20
  Administered 2023-11-14 – 2023-11-17 (×3): 2 [IU] via SUBCUTANEOUS
  Administered 2023-11-18: 3 [IU] via SUBCUTANEOUS
  Administered 2023-11-19: 2 [IU] via SUBCUTANEOUS
  Filled 2023-11-14 (×5): qty 1

## 2023-11-14 MED ORDER — ACETAMINOPHEN 160 MG/5ML PO SOLN: 650.0000 mg | ORAL | Status: DC | PRN

## 2023-11-14 MED ORDER — IOHEXOL 350 MG/ML SOLN
75.0000 mL | Freq: Once | INTRAVENOUS | Status: AC | PRN
  Administered 2023-11-14: 75 mL via INTRAVENOUS

## 2023-11-14 MED ORDER — ACETAMINOPHEN 650 MG RE SUPP: 650.0000 mg | RECTAL | Status: DC | PRN

## 2023-11-14 MED ORDER — APIXABAN 5 MG PO TABS
5.0000 mg | ORAL_TABLET | Freq: Two times a day (BID) | ORAL | Status: DC
  Administered 2023-11-14 – 2023-11-20 (×13): 5 mg via ORAL
  Filled 2023-11-14 (×13): qty 1

## 2023-11-14 MED ORDER — ATORVASTATIN CALCIUM 20 MG PO TABS
80.0000 mg | ORAL_TABLET | Freq: Every day | ORAL | Status: DC
  Administered 2023-11-14 – 2023-11-20 (×7): 80 mg via ORAL
  Filled 2023-11-14 (×7): qty 4

## 2023-11-14 MED ORDER — SPIRONOLACTONE 25 MG PO TABS
25.0000 mg | ORAL_TABLET | Freq: Every day | ORAL | Status: DC
  Administered 2023-11-14 – 2023-11-20 (×7): 25 mg via ORAL
  Filled 2023-11-14 (×7): qty 1

## 2023-11-14 MED ORDER — INSULIN ASPART 100 UNIT/ML IJ SOLN
0.0000 [IU] | Freq: Three times a day (TID) | INTRAMUSCULAR | Status: DC
  Administered 2023-11-14: 3 [IU] via SUBCUTANEOUS
  Administered 2023-11-14: 5 [IU] via SUBCUTANEOUS
  Administered 2023-11-14 – 2023-11-15 (×2): 3 [IU] via SUBCUTANEOUS
  Administered 2023-11-15: 8 [IU] via SUBCUTANEOUS
  Administered 2023-11-15: 3 [IU] via SUBCUTANEOUS
  Administered 2023-11-16: 5 [IU] via SUBCUTANEOUS
  Administered 2023-11-16 – 2023-11-17 (×3): 3 [IU] via SUBCUTANEOUS
  Administered 2023-11-17 – 2023-11-18 (×5): 5 [IU] via SUBCUTANEOUS
  Administered 2023-11-19 (×2): 3 [IU] via SUBCUTANEOUS
  Administered 2023-11-19: 5 [IU] via SUBCUTANEOUS
  Administered 2023-11-20 (×2): 3 [IU] via SUBCUTANEOUS
  Filled 2023-11-14 (×20): qty 1

## 2023-11-14 MED ORDER — FINASTERIDE 5 MG PO TABS
5.0000 mg | ORAL_TABLET | Freq: Every day | ORAL | Status: DC
  Administered 2023-11-14 – 2023-11-20 (×7): 5 mg via ORAL
  Filled 2023-11-14 (×7): qty 1

## 2023-11-14 MED ORDER — STROKE: EARLY STAGES OF RECOVERY BOOK: Freq: Once | Status: AC

## 2023-11-14 MED ORDER — CARVEDILOL 6.25 MG PO TABS
6.2500 mg | ORAL_TABLET | Freq: Two times a day (BID) | ORAL | Status: DC
  Administered 2023-11-14 – 2023-11-20 (×13): 6.25 mg via ORAL
  Filled 2023-11-14 (×13): qty 1

## 2023-11-14 NOTE — ED Notes (Signed)
At CT

## 2023-11-14 NOTE — Progress Notes (Signed)
Nonbillable note Peter Ryan is a 73 y.o. male with medical history significant for stroke with right-sided hemiparesis , paroxysmal atrial fibrillation, systolic CHF, hypertension, diabetes mellitus,  gout, BPH,  history of non-Hodgkin's lymphoma, who presented to the ED with a several week increased slurring of speech noted by his caregiver..patient states for the past year he has intermittently had slurred speech especially on awaking in the morning.  He feels like it is no different from the baseline however his caregiver got concerned that it was more slurred than usual.  Patient admitted to the hospital due to concerns for an acute stroke, MRI of the brain shows no evidence of an acute or recent subacute infarction. Parenchymal atrophy, advanced chronic small vessel ischemic disease and chronic infarcts, as described. A chronic infarct within the left ventral aspect of the medulla is new from the prior MRI of 11/30/2020. Also new from the prior MRI, there is signal abnormality within the left vertebral artery at the V3/V4 junction, which may reflect high-grade stenosis or vessel occlusion. Consider CT or MR angiography for further evaluation. Small chronic hemorrhage within the right external capsule. Chronic microhemorrhages within the bilateral thalami, likely reflecting sequelae of chronic hypertensive microangiopathy. CT angiogram of the head and neck shows occluded left V3 vertebral artery which could be due to atherosclerosis or dissection. This finding is age indeterminate but new since 2022.  Approximately 60% stenosis of the proximal right V2 vertebral artery. Appreciate neurology input, patient has a CHA2DS2-VASc score of 5 and overall burden of microhemorrhages is relatively low.  Continue Eliquis since the risk of recurrent thromboembolic event if anticoagulation is held is felt to be greater than the risk for intracranial hemorrhage if anticoagulation is continued. Patient was seen by  OT and they recommend SNF on discharge due to increased weakness, decreased activity tolerance, and impaired balance resulting in increased assistance required for ADL and mobility.

## 2023-11-14 NOTE — Assessment & Plan Note (Signed)
Sliding scale insulin coverage

## 2023-11-14 NOTE — ED Provider Notes (Signed)
Signout received pending CT angiogram.  Symptoms aphasia/slurred CV status transient concerning for TIA, especially in the light of abnormal vessel imaging on CT angiogram.  Patient stable.  Admit for TIA workup.   Pilar Jarvis, MD 11/14/23 [phone]

## 2023-11-14 NOTE — Assessment & Plan Note (Signed)
Continue carvedilol and apixaban.

## 2023-11-14 NOTE — Hospital Course (Signed)
Peter Ryan

## 2023-11-14 NOTE — Assessment & Plan Note (Addendum)
History of stroke with baseline slurred speech No acute findings on MRI but areas of high-grade stenosis, multiple chronic microhemorrhages Continue atorvastatin and apixaban Imaging negative for stroke, likely a TIA PT and OT are recommending SNF

## 2023-11-14 NOTE — ED Notes (Signed)
Pt failed swallow screen at this time. SLP eval order placed at this time.

## 2023-11-14 NOTE — Assessment & Plan Note (Signed)
Clinically compensated Continue GDMT

## 2023-11-14 NOTE — ED Notes (Signed)
Pts brief changed, triad cream and mepilex applied at this time per pt request. Pt repositioned and warm blankets applied. Resting comfortably and denies complaints at this time.

## 2023-11-14 NOTE — ED Notes (Signed)
CCMD called and pt put on cardiac monitoring.

## 2023-11-14 NOTE — Evaluation (Signed)
Occupational Therapy Evaluation Patient Details Name: Peter Ryan MRN: [id number] DOB: 25-Jan-1951 Today's Date: 11/14/2023   History of Present Illness   Pt admitted to [facility] on 11/13/23 for increased slurred speech per caregiver. Significant PMH includes:  stroke (residual R hemiparesis and dysarthria), liver cirrhosis, HTN, DM, dyslipidemia, chronic prostatitis, gout, BPH, bedbound but can sit in the recliner, history of non-Hodgkin's lymphoma, history of thoracentesis, chronic hypokalemia, obesity, chronic anemia.     Clinical Impressions Pt was seen for OT evaluation this date. Prior to hospital admission, pt was living with his spouse in a 1 story home with caregiver assist for ~48min/day, 7 days/week for STS transfers using STS lift to [facility]. Pt in lift recliner otherwise. Spouse assists pt with bathing 1x/wk using lift to transfer to shower chair. Pt previously was able to maintain static sitting balance and assist some with seated UB bathing. Recently pt has had worsening balance and strength leading to increased assist for bathing, dressing, and transfers. Spouse reports she and caregiver are no longer able to assist pt safely with STS lift transfers. Pt presents to acute OT demonstrating impaired ADL performance and functional mobility 2/2 decreased strength, ROM, balance, and activity tolerance (See OT problem list for additional functional deficits). Pt currently requires MAX A for all aspects of bathing, dressing, and toileting from bed level, anticipate need for hoyer lift for all transfers. Pt currently not at his baseline and now is requiring increased assist with ADL and mobility. Pt would benefit from skilled OT services to address noted impairments and functional limitations (see below for any additional details) in order to maximize safety and independence while minimizing falls risk and caregiver burden.   If plan is discharge home, recommend the following:   Two people to help  with walking and/or transfers;Two people to help with bathing/dressing/bathroom;Assistance with cooking/housework;Assist for transportation;Help with stairs or ramp for entrance     Functional Status Assessment   Patient has had a recent decline in their functional status and demonstrates the ability to make significant improvements in function in a reasonable and predictable amount of time.     Equipment Recommendations   Other (comment) (defer to next venue of care, anticipate hospital bed and hoyer lift may be appropriate)     Recommendations for Other Services         Precautions/Restrictions   Precautions Precautions: Fall Recall of Precautions/Restrictions: Intact Restrictions Weight Bearing Restrictions Per Provider Order: No     Mobility Bed Mobility Overal bed mobility: Needs Assistance             General bed mobility comments: Pt requires TOTAL A for bed mobility    Transfers                   General transfer comment: deferred, anticipate +2-3 for attempts, may require hoyer lift (was increasingly difficult to use a STS lift at home)      Balance Overall balance assessment: Needs assistance   Sitting balance-Leahy Scale: Poor       Standing balance-Leahy Scale: Zero                             ADL either performed or assessed with clinical judgement   ADL Overall ADL's : Needs assistance/impaired Eating/Feeding: Modified independent Eating/Feeding Details (indicate cue type and reason): using non-dom L hand Grooming: Bed level;Moderate assistance Grooming Details (indicate cue type and reason): MOD A for R hand hygiene  Upper Body Bathing: Bed level;Maximal assistance   Lower Body Bathing: Bed level;Maximal assistance;+2 for physical assistance   Upper Body Dressing : Bed level;Maximal assistance   Lower Body Dressing: Maximal assistance;+2 for physical assistance;Bed level                       Vision          Perception         Praxis         Pertinent Vitals/Pain Pain Assessment Pain Assessment: No/denies pain     Extremity/Trunk Assessment Upper Extremity Assessment Upper Extremity Assessment: Generalized weakness;Right hand dominant;RUE deficits/detail RUE Deficits / Details: grip 2/5, all else trace, sensation intact, tends to keep RUE fisted but able to open with assist RUE Sensation: WNL RUE Coordination: decreased fine motor;decreased gross motor   Lower Extremity Assessment Lower Extremity Assessment: Defer to PT evaluation;Generalized weakness;RLE deficits/detail RLE Deficits / Details: 1/5 ankle ROM, trace all else RLE Sensation: WNL       Communication     Cognition Arousal: Alert Behavior During Therapy: WFL for tasks assessed/performed Cognition: No apparent impairments                               Following commands: Intact       Cueing  General Comments          Exercises Other Exercises Other Exercises: pt/spouse educated in role of acute OT   Shoulder Instructions      Home Living Family/patient expects to be discharged to:: Private residence Living Arrangements: Spouse/significant other Available Help at Discharge: Family;Personal care attendant;Available PRN/intermittently;Available 24 hours/day (PCA ~55min/day, 7 days/wk to assist spouse with STS lift transfers) Type of Home: House Home Access: Ramped entrance     Home Layout: One level     Bathroom Shower/Tub: Walk-in shower         Home Equipment: Rollator (4 wheels);Cane - single point;BSC/3in1;Shower seat;Lift chair;Other (comment)   Additional Comments: STS lift      Prior Functioning/Environment Prior Level of Function : Needs assist             Mobility Comments: Pt requires +2 assist for all mobility, spouse reports recently requiring increased assist to the point where spouse and caregiver are unable to safely complete themselves ADLs  Comments: Pt requires MAX A for bathing, dressing, toileting. Able to self feed, complete most grooming tasks himself, and spouse manages all IADL. Has recently required increased ADL assist.    OT Problem List: Decreased strength;Decreased coordination;Decreased range of motion;Decreased activity tolerance;Obesity;Impaired balance (sitting and/or standing);Decreased knowledge of use of DME or AE;Impaired UE functional use   OT Treatment/Interventions: Self-care/ADL training;Therapeutic exercise;Therapeutic activities;DME and/or AE instruction;Patient/family education;Balance training;Manual therapy      OT Goals(Current goals can be found in the care plan section)   Acute Rehab OT Goals Patient Stated Goal: get better OT Goal Formulation: With patient/family Time For Goal Achievement: 11/28/23 Potential to Achieve Goals: Fair ADL Goals Additional ADL Goal #1: Pt will complete bed mobility for repositioning with MAX A +1 to prevent pressure wounds and assist staff with ADL. Additional ADL Goal #2: Pt will complete R hand hygiene to prevent skin breakdown with setup assist.   OT Frequency:  Min 1X/week    Co-evaluation              AM-PAC OT "6 Clicks" Daily Activity     Outcome  Measure Help from another person eating meals?: None Help from another person taking care of personal grooming?: A Little Help from another person toileting, which includes using toliet, bedpan, or urinal?: Total Help from another person bathing (including washing, rinsing, drying)?: Total Help from another person to put on and taking off regular upper body clothing?: A Lot Help from another person to put on and taking off regular lower body clothing?: Total 6 Click Score: 12   End of Session    Activity Tolerance: Patient tolerated treatment well Patient left: in bed;with call bell/phone within reach;with family/visitor present  OT Visit Diagnosis: Other abnormalities of gait and mobility  (R26.89);Muscle weakness (generalized) (M62.81)                Time: 0960-4540 OT Time Calculation (min): 19 min Charges:  OT General Charges $OT Visit: 1 Visit OT Evaluation $OT Eval Low Complexity: 1 Low  Arman Filter., MPH, MS, OTR/L ascom [phone] 11/14/23, 10:59 AM

## 2023-11-14 NOTE — Evaluation (Signed)
Physical Therapy Evaluation Patient Details Name: Peter Ryan MRN: [id number] DOB: Sep 25, 1951 Today's Date: 11/14/2023  History of Present Illness  Pt admitted to [facility] on 11/13/23 for increased slurred speech per caregiver. Significant PMH includes:  stroke (residual R hemiparesis and dysarthria), liver cirrhosis, HTN, DM, dyslipidemia, chronic prostatitis, gout, BPH, bedbound but can sit in the recliner, history of non-Hodgkin's lymphoma, history of thoracentesis, chronic hypokalemia, obesity, chronic anemia.  Clinical Impression  Pt in ED gurney in hall. Wife at bedside PT remarkably weak on LLE, most strength potential in LUE; trunk is incredibly weak and stiff with habitus resistance; RUE is not of much use for mobility, rather creates more safety needs. RLE is also quite weak. Pt was able to partake in pull to stand transfers and sustained sitting for ADL, but has not been able to participate at this level for several weeks. Whether going back to home or transitioning to ALF, pt would benefit from STR to regain strength back to his best self prior to making this transition. Will follow.       If plan is discharge home, recommend the following: Two people to help with bathing/dressing/bathroom;Two people to help with walking and/or transfers;Help with stairs or ramp for entrance;Assist for transportation;Assistance with cooking/housework   Can travel by private vehicle        Equipment Recommendations None recommended by PT  Recommendations for Other Services       Functional Status Assessment Patient has had a recent decline in their functional status and demonstrates the ability to make significant improvements in function in a reasonable and predictable amount of time.     Precautions / Restrictions Precautions Precautions: Fall Restrictions Weight Bearing Restrictions Per Provider Order: No      Mobility  Bed Mobility Overal bed mobility: Needs Assistance Bed Mobility:  Supine to Sit     Supine to sit: Total assist, +2 for physical assistance     General bed mobility comments: unable to achieve successful short sitting with bed features, LUE on bed rail pull and authro assisting with trunk;    Transfers Overall transfer level:  (does not transfer)                      Ambulation/Gait Ambulation/Gait assistance:  (does not AMB)                Stairs            Wheelchair Mobility     Tilt Bed    Modified Rankin (Stroke Patients Only)       Balance                                             Pertinent Vitals/Pain      Home Living Family/patient expects to be discharged to:: Private residence Living Arrangements: Spouse/significant other Available Help at Discharge: Family;Personal care attendant;Available PRN/intermittently;Available 24 hours/day Type of Home: House Home Access: Ramped entrance       Home Layout: One level Home Equipment: Rollator (4 wheels);Cane - single point;BSC/3in1;Shower seat;Lift chair;Other (comment) Additional Comments: STEDY lift; lift chair    Prior Function Prior Level of Function : Needs assist             Mobility Comments: Pt requires +2 assist for all mobility, spouse reports recently requiring increased assist to the point where spouse and caregiver  are unable to safely complete themselves ADLs Comments: Pt requires MAX A for bathing, dressing, toileting. Able to self feed, complete most grooming tasks himself, and spouse manages all IADL. Has recently required increased ADL assist.     Extremity/Trunk Assessment                Communication        Cognition                                         Cueing       General Comments      Exercises General Exercises - Lower Extremity Ankle Circles/Pumps: AROM, Both, 5 reps, Supine, Limitations Ankle Circles/Pumps Limitations: <15 active degrees due to weakness Heel  Slides: AAROM, Both, 5 reps, Limitations Heel Slides Limitations: max-totalA RLE Other Exercises Other Exercises: LUE outward reach x1; LUE functional chest press out 5/5; LUE functional pull 5/5 (but not helpful enough to pull self up to short sitting)   Assessment/Plan    PT Assessment Patient needs continued PT services  PT Problem List Decreased strength;Decreased range of motion;Decreased activity tolerance;Decreased balance;Decreased coordination;Decreased mobility;Decreased cognition       PT Treatment Interventions DME instruction;Gait training;Stair training;Functional mobility training;Therapeutic activities;Therapeutic exercise;Balance training;Neuromuscular re-education;Cognitive remediation;Patient/family education    PT Goals (Current goals can be found in the Care Plan section)  Acute Rehab PT Goals Patient Stated Goal: regain ability to sit with trunk control for ADL performance PT Goal Formulation: With patient Time For Goal Achievement: 11/28/23 Potential to Achieve Goals: Fair    Frequency Min 1X/week     Co-evaluation               AM-PAC PT "6 Clicks" Mobility  Outcome Measure Help needed turning from your back to your side while in a flat bed without using bedrails?: Total Help needed moving from lying on your back to sitting on the side of a flat bed without using bedrails?: Total Help needed moving to and from a bed to a chair (including a wheelchair)?: Total Help needed standing up from a chair using your arms (e.g., wheelchair or bedside chair)?: Total Help needed to walk in hospital room?: Total Help needed climbing 3-5 steps with a railing? : Total 6 Click Score: 6    End of Session   Activity Tolerance: Patient tolerated treatment well Patient left: in bed;with family/visitor present   PT Visit Diagnosis: Other abnormalities of gait and mobility (R26.89);Muscle weakness (generalized) (M62.81)    Time: 1610-9604 PT Time Calculation (min)  (ACUTE ONLY): 14 min   Charges:   PT Evaluation $PT Eval Moderate Complexity: 1 Mod   PT General Charges $$ ACUTE PT VISIT: 1 Visit        3:41 PM, 11/14/23 Rosamaria Lints, PT, DPT Physical Therapist - [facility]  [phone] (ASCOM)    Paizlie Klaus C 11/14/2023, 3:30 PM

## 2023-11-14 NOTE — Assessment & Plan Note (Signed)
No acute issues suspected

## 2023-11-14 NOTE — Plan of Care (Signed)
Neurology plan of care  I was contacted by hospitalist regarding safety of continuing anticoagulation in this patient with microhemorrhages on MRI brain.  Pt admitted to [facility] on 11/13/23 for increased slurred speech per caregiver. Significant PMH includes: stroke (residual R hemiparesis and dysarthria), liver cirrhosis, HTN, DM, dyslipidemia, chronic prostatitis, gout, BPH, bedbound but can sit in the recliner, history of non-Hodgkin's lymphoma, history of thoracentesis, chronic hypokalemia, obesity, chronic anemia.   He has A-fib and is on chronic anticoagulation with apixaban. He had a MRI brain this admission which does not show an acute infarct but does show some areas of microhemorrhage.  Patient's CHADS-VASc is 5. I reviewed the MRI, overall burden of microhemorrhages is relatively low, therefore OK to restart eliquis. The risk of a recurrent thromboembolic event if anticoagulation is held is felt to be greater than the risk for ICH if anticoagulation is continued.  Please contact me for any further questions.  Bing Neighbors, MD [facility] [phone]  If 7pm- 7am, please page neurology on call as listed in AMION.

## 2023-11-14 NOTE — Evaluation (Signed)
Clinical/Bedside Swallow Evaluation Patient Details  Name: Peter Ryan MRN: [id number] Date of Birth: 09-16-1951  Today's Date: 11/14/2023 Time: SLP Start Time (ACUTE ONLY): 0810 SLP Stop Time (ACUTE ONLY): 0905 SLP Time Calculation (min) (ACUTE ONLY): 55 min  Past Medical History:  Past Medical History:  Diagnosis Date   Arthritis    BPH (benign prostatic hyperplasia)    Cancer (HCC)    Non-Hodgkins lymphoma   Chronic prostatitis    Cirrhosis of liver (HCC)    Diabetes mellitus without complication (HCC)    Dyslipidemia    Elevated PSA    Gastric reflux    Gout    HTN (hypertension)    Hyperlipidemia    Neuropathy    Over weight    Psoriasis    Testicular pain, right    Urinary frequency    Past Surgical History:  Past Surgical History:  Procedure Laterality Date   hydrocelectomy     IR FLUORO GUIDE PORT INSERTION RIGHT  04/11/2017   IR REMOVAL TUN ACCESS W/ PORT W/O FL MOD SED  05/29/2018   PILONIDAL CYST EXCISION     TEE WITHOUT CARDIOVERSION N/A 06/09/2020   Procedure: TRANSESOPHAGEAL ECHOCARDIOGRAM (TEE);  Surgeon: Antonieta Iba, MD;  Location: [facility] ORS;  Service: Cardiovascular;  Laterality: N/A;   HPI:  Pt admitted to [facility] on 11/13/23 for increased weakness and slurred speech(which has been ongoing for ~1 month "off/on") per caregiver. Significant PMH includes:  prior stroke in 05/2020 (residual R hemiparesis and dysarthria), liver cirrhosis, Obesity, diffuse large B-cell lymphoma s/p 6 cycles R-CHOP, T2DM, HTN, HLD, IgA MGUS, BPH, DM, dyslipidemia, chronic prostatitis, gout, bedbound but can sit in the recliner, history of non-Hodgkin's lymphoma, history of thoracentesis, chronic hypokalemia, obesity, chronic anemia.   MRI: No evidence of an acute or recent subacute infarction.  2. Parenchymal atrophy, advanced chronic small vessel ischemic  disease and chronic infarcts, as described. A chronic infarct within  the left ventral aspect of the medulla is new from the  prior MRI of  11/30/2020.  3. Also new from the prior MRI, there is signal abnormality within  the left vertebral artery at the V3/V4 junction, which may reflect  high-grade stenosis or vessel occlusion. Consider CT or MR  angiography for further evaluation.  4. Small chronic hemorrhage within the right external capsule.  5. Chronic microhemorrhages within the bilateral thalami, likely  reflecting sequelae of chronic hypertensive microangiopathy.    Assessment / Plan / Recommendation  Clinical Impression   Pt seen for BSE this morning. Pt awake; weak and deconditioned appearing. Verbal w/ min slower rate of speech, hesitations, and low volume -- some of which are Baseline s/p prior CVA. Both Wife and pt felt pt was "close to his baseline" w/ his speech this morning. Pt able to answer general questions re: self and follow basic commands. Wife present.  OF NOTE: Pt reports inconsistent difficulty w/ swallowing po's at home "since my stroke" (05/2020). This was noted in previous chart note by ST services in 2022. Pt on RA, afebrile. WBC WNL.  Pt appears to present w/ grossly functional oropharyngeal phase swallowing w/ No overt oropharyngeal phase dysphagia noted when following general aspiration precautions -- using SMALL sips of thin liquids SLOWLY via Cup. Pt consumed po trials w/ No immediate, overt clinical s/s of aspiration during po the trials. He gave no report of difficulty swallowing consistencies/trials given.  Though pt's risk for aspiration is present in setting of Prior CVA and current deconditioning, the risk  appears reduced when following aspiration precautions including NO STRAW USE and focusing on following the general aspiration precautions of SMALL, SINGLE sips SLOWLY via Cup(pt endorsed eating too much too fast at home often; Wife agreed).  Pt has a Baseline Left pontine CVA(05/2020) w/ residual Right sided weakness including decreased labial tone on R side. Pt was seen by ST services while  at CIR(05/2020 - see CIR notes) then Discharged from services. Pt also seen by ST services in 2022 w/ similar recommendations re: following aspiration precautions w/ oral intake.   During po trials, pt helped to feed self consuming all consistencies w/ no overt s/s of aspiration noted: no decline in vocal quality or change in respiratory presentation during/post trials, no immediate cough. No decline in respiratory presentation occurred; O2 sats remained in upper 90s during. Oral phase appeared [facility] w/ timely bolus management, mastication, and control of bolus propulsion for A-P transfer for swallowing. Oral clearing achieved w/ all trial consistencies. Educated on Small bites = better oral control of boluses. SMALL SINGLE sips/bites SLOWLY were instructed. OM Exam appeared grossly [facility] w/ no unilateral lingual weakness noted though slow consecutive movements; min R labial decreased tone at rest(baseline). Pt fed self w/ setup support d/t RUE weakness.   Recommend a more Mech Soft/Regular consistency diet w/ well-Cut meats/foods, moistened foods; Thin liquids VIA CUP - pt is NOT to use Straws for better control when drinking. Recommend a Dysphagia Drink Cup for flow control and handle to hold. Recommend aspiration precautions as discussed above and practiced w/ pt. Tray setup and support at meals as needed d/t RUE weakness. Reduce distractions and talking at meals. Pills WHOLE in Puree for safer, easier swallowing -- he has been rec'd this previously. This was recommended from this point forward for pt's safety w/ Pill swallowing.   Education given on Pills in Puree; food consistencies and easy to eat options; general aspiration precautions; handouts given. As pt appears at/near his Baseline, ST services will monitor for any education needs and/or speech needs next 1-2 days. Pt/Wife/NSG and MD updated at bedside.  (Currently, pt is verbal and communicating all of his wants/needs appropriately to others. Follows  instructions w/ intermittent guide/cue. He was able to indicate need for following general aspiration precautions and give 3-4 precautions independently. The R decreased labial tone is Baseline for pt; could be slightly more pronounced per Neurology/MD at this time d/t acute illness. Speech intelligibility is appropriate however. Encouraged pt to remember to use his strategies of increased volume and over-articulation during speech. Noted CIR notes and Discharge in 05/2020. See chart.)  SLP Visit Diagnosis: Dysphagia, pharyngeal phase (R13.13) (suspected in setting of Prior CVA and current deconditioning)    Aspiration Risk  Mild aspiration risk;Risk for inadequate nutrition/hydration (reduced following general aspiration precautions)    Diet Recommendation   Thin;Dysphagia 3 (mechanical soft) = a more Mech Soft/Regular consistency diet w/ well-Cut meats/foods, moistened foods; Thin liquids VIA CUP - pt is NOT to use Straws for better control when drinking. Recommend a Dysphagia Drink Cup for flow control and handle to hold. Recommend aspiration precautions as discussed above and practiced w/ pt. Tray setup and support at meals as needed d/t RUE weakness. Reduce distractions and talking at meals.   Medication Administration: Whole meds with puree    Other  Recommendations Recommended Consults:  (Dietician) Oral Care Recommendations: Oral care BID;Patient independent with oral care (setup)    Recommendations for follow up therapy are one component of a multi-disciplinary discharge planning  process, led by the attending physician.  Recommendations may be updated based on patient status, additional functional criteria and insurance authorization.  Follow up Recommendations No SLP follow up (expected at D/C)      Assistance Recommended at Discharge  Intermittent at meals  Functional Status Assessment Patient has had a recent decline in their functional status and/or demonstrates limited ability to  make significant improvements in function in a reasonable and predictable amount of time  Frequency and Duration min 1 x/week  1 week       Prognosis Prognosis for improved oropharyngeal function: Fair Barriers to Reach Goals: Time post onset;Severity of deficits;Behavior Barriers/Prognosis Comment: prior CVA w/ dysphagia; deconditioning      Swallow Study   General Date of Onset: 11/13/23 HPI: Pt admitted to [facility] on 11/13/23 for increased weakness and slurred speech(which has been ongoing for ~1 month "off/on") per caregiver. Significant PMH includes:  prior stroke in 05/2020 (residual R hemiparesis and dysarthria), liver cirrhosis, Obesity, diffuse large B-cell lymphoma s/p 6 cycles R-CHOP, T2DM, HTN, HLD, IgA MGUS, BPH, DM, dyslipidemia, chronic prostatitis, gout, bedbound but can sit in the recliner, history of non-Hodgkin's lymphoma, history of thoracentesis, chronic hypokalemia, obesity, chronic anemia.   MRI: No evidence of an acute or recent subacute infarction.  2. Parenchymal atrophy, advanced chronic small vessel ischemic  disease and chronic infarcts, as described. A chronic infarct within  the left ventral aspect of the medulla is new from the prior MRI of  11/30/2020.  3. Also new from the prior MRI, there is signal abnormality within  the left vertebral artery at the V3/V4 junction, which may reflect  high-grade stenosis or vessel occlusion. Consider CT or MR  angiography for further evaluation.  4. Small chronic hemorrhage within the right external capsule.  5. Chronic microhemorrhages within the bilateral thalami, likely  reflecting sequelae of chronic hypertensive microangiopathy. Type of Study: Bedside Swallow Evaluation Previous Swallow Assessment: 11/2020 Diet Prior to this Study: NPO (regular diet at home per report) Temperature Spikes Noted: No (wbc 9.1) Respiratory Status: Room air History of Recent Intubation: No Behavior/Cognition: Alert;Cooperative;Pleasant  mood;Distractible;Requires cueing (min) Oral Cavity Assessment: Within Functional Limits Oral Care Completed by SLP: Yes Oral Cavity - Dentition: Adequate natural dentition Vision: Functional for self-feeding Self-Feeding Abilities: Able to feed self;Needs assist;Needs set up (weak UEs) Patient Positioning: Upright in bed (MAX assist) Baseline Vocal Quality: Low vocal intensity Volitional Cough: Strong Volitional Swallow: Able to elicit    Oral/Motor/Sensory Function Overall Oral Motor/Sensory Function: Mild impairment (slight-) Facial Symmetry: Abnormal symmetry right Lingual ROM: Within Functional Limits Lingual Symmetry: Within Functional Limits Lingual Strength: Within Functional Limits (Fair; slow consecutive movements) Lingual Sensation: Within Functional Limits Velum: Within Functional Limits Mandible: Within Functional Limits   Ice Chips Ice chips: Within functional limits (x2)   Thin Liquid Thin Liquid: Within functional limits Presentation: Cup;Self Fed (10 trials) Other Comments: pt reports coughing at home "off/on" -- Wife reported when he drinks "too fast"    Nectar Thick Nectar Thick Liquid: Not tested   Honey Thick Honey Thick Liquid: Not tested   Puree Puree: Within functional limits Presentation: Spoon;Self Fed (supported; 8 trials)   Solid     Solid: Within functional limits Presentation: Self Fed (8 trials) Other Comments: moistened        Jerilynn Som, MS, CCC-SLP Speech Language Pathologist Rehab Services; [facility] [phone] (ascom) Leatha Rohner 11/14/2023,12:40 PM

## 2023-11-14 NOTE — H&P (Signed)
History and Physical    Patient: Peter Ryan WUJ:[id number] DOB: Jan 09, 1951 DOA: 11/13/2023 DOS: the patient was seen and examined on 11/14/2023 PCP: Nona Dell, NP  Patient coming from: Home  Chief Complaint:  Chief Complaint  Patient presents with   Aphasia    HPI: Peter Ryan is a 73 y.o. male with medical history significant for stroke with right-sided hemiparesis , paroxysmal atrial fibrillation, systolic CHF, hypertension, diabetes mellitus,  gout, BPH,  history of non-Hodgkin's lymphoma, who presented to the ED with a several week increased slurring of speech noted by his caregiver..patient states for the past year he has intermittently had slurred speech especially on awaking in the morning.  He feels like it is no different from the baseline however his caregiver got concerned that it was more slurred than usual.  ED course.  BP as high as 193/109 with otherwise normal vitals Labs notable for glucose 253 and BNP 480 otherwise unremarkable. EKG, personally viewed and interpreted showing NSR at 92 with nonspecific T wave changes CTA head and neck showing vertebral artery stenosis and V2 on the right and occluded left V3 vertebral artery MRI negative for acute or recent subacute infarct but confirming high-grade stenosis left vertebral artery Hospitalist consulted for admission for possible TIA, possible placement    Past Medical History:  Diagnosis Date   Arthritis    BPH (benign prostatic hyperplasia)    Cancer (HCC)    Non-Hodgkins lymphoma   Chronic prostatitis    Cirrhosis of liver (HCC)    Diabetes mellitus without complication (HCC)    Dyslipidemia    Elevated PSA    Gastric reflux    Gout    HTN (hypertension)    Hyperlipidemia    Neuropathy    Over weight    Psoriasis    Testicular pain, right    Urinary frequency    Past Surgical History:  Procedure Laterality Date   hydrocelectomy     IR FLUORO GUIDE PORT INSERTION RIGHT  04/11/2017    IR REMOVAL TUN ACCESS W/ PORT W/O FL MOD SED  05/29/2018   PILONIDAL CYST EXCISION     TEE WITHOUT CARDIOVERSION N/A 06/09/2020   Procedure: TRANSESOPHAGEAL ECHOCARDIOGRAM (TEE);  Surgeon: Antonieta Iba, MD;  Location: [facility] ORS;  Service: Cardiovascular;  Laterality: N/A;   Social History:  reports that he quit smoking about 49 years ago. His smoking use included cigarettes. He started smoking about 57 years ago. He has a 12 pack-year smoking history. He has never used smokeless tobacco. He reports that he does not drink alcohol and does not use drugs.  Allergies  Allergen Reactions   Hctz [Hydrochlorothiazide] Other (See Comments)    Gout flare   Metformin Other (See Comments)    Stomach upset     Family History  Problem Relation Age of Onset   Uterine cancer Mother    Diabetes Mother    Cancer Mother    Cancer Maternal Uncle    Bladder Cancer Neg Hx    Kidney disease Neg Hx    Prostate cancer Neg Hx     Prior to Admission medications   Medication Sig Start Date End Date Taking? Authorizing Provider  acetaminophen (TYLENOL) 325 MG tablet Take 2 tablets (650 mg total) by mouth every 4 (four) hours as needed for mild pain (or temp > 37.5 C (99.5 F)). 07/10/20   Angiulli, Mcarthur Rossetti, PA-C  allopurinol (ZYLOPRIM) 300 MG tablet TAKE ONE TABLET EVERY DAY 06/11/21   Sullivan Lone,  Ferdinand Lango, MD  amiodarone (PACERONE) 200 MG tablet Take 1 tablet (200 mg total) by mouth 2 (two) times daily. 12/23/22 01/22/23  Tresa Moore, MD  apixaban (ELIQUIS) 5 MG TABS tablet Take 1 tablet (5 mg total) by mouth 2 (two) times daily. 12/23/22 01/22/23  Tresa Moore, MD  atorvastatin (LIPITOR) 80 MG tablet TAKE ONE TABLET (80 MG) BY MOUTH EVERY DAY 07/09/21   Bosie Clos, MD  BOUDREAUXS BUTT PASTE 40 % ointment Apply 1 application. topically 2 (two) times daily. 10/23/21   [provider]  carvedilol (COREG) 6.25 MG tablet Take 1 tablet (6.25 mg total) by mouth 2 (two) times daily with a  meal. 12/23/22 01/22/23  Lolita Patella B, MD  Cholecalciferol (VITAMIN D3) 25 MCG (1000 UT) CAPS Take 1 capsule (1,000 Units total) by mouth daily. 07/10/20   Angiulli, Mcarthur Rossetti, PA-C  ferrous sulfate 325 (65 FE) MG tablet Take 1 tablet (325 mg total) by mouth every other day. Patient taking differently: Take 325 mg by mouth every other day. Mon/wed/friday 02/09/22 12/16/22  Lurene Shadow, MD  finasteride (PROSCAR) 5 MG tablet Take 1 tablet (5 mg total) by mouth daily. 03/01/21   Michiel Cowboy A, PA-C  furosemide (LASIX) 40 MG tablet Take 1 tablet (40 mg total) by mouth daily. 12/23/22 01/22/23  Tresa Moore, MD  hydrocortisone 2.5 % lotion Apply topically as directed. Apply to affected areas as needed up to three times weekly. 12/25/20   Deirdre Evener, MD  polyethylene glycol (MIRALAX / GLYCOLAX) 17 g packet Take 17 g by mouth daily as needed. 02/09/22   Lurene Shadow, MD  spironolactone (ALDACTONE) 25 MG tablet Take 1 tablet (25 mg total) by mouth daily. 12/23/22 01/22/23  Tresa Moore, MD  vitamin B-12 (CYANOCOBALAMIN) 1000 MCG tablet Take 1 tablet (1,000 mcg total) by mouth daily. 09/04/20   Bosie Clos, MD    Physical Exam: Vitals:   11/13/23 2300 11/14/23 0100 11/14/23 0200 11/14/23 0300  BP: (!) 159/79 (!) 173/109 (!) 193/109 (!) 190/104  Pulse: 87 92 92 91  Resp: 18 18 18 18   Temp:      TempSrc:      SpO2: 96% 93% 94% 93%  Weight:      Height:       Physical Exam Vitals and nursing note reviewed.  Constitutional:      General: He is not in acute distress. HENT:     Head: Normocephalic and atraumatic.  Cardiovascular:     Rate and Rhythm: Normal rate and regular rhythm.     Heart sounds: Normal heart sounds.  Pulmonary:     Effort: Pulmonary effort is normal.     Breath sounds: Normal breath sounds.  Abdominal:     Palpations: Abdomen is soft.     Tenderness: There is no abdominal tenderness.  Musculoskeletal:     Right lower leg: Edema present.      Left lower leg: Edema present.  Neurological:     Mental Status: Mental status is at baseline.     Comments: Old right hemiparesis, very mild dysarthria     Labs on Admission: I have personally reviewed following labs and imaging studies  CBC: Recent Labs  Lab 11/13/23 1225  WBC 9.1  NEUTROABS 6.4  HGB 15.8  HCT 47.9  MCV 87.1  PLT 262   Basic Metabolic Panel: Recent Labs  Lab 11/13/23 1225  NA 139  K 3.7  CL 101  CO2 25  GLUCOSE 253*  BUN 10  CREATININE 0.69  CALCIUM 9.0   GFR: Estimated Creatinine Clearance: 103.3 mL/min (by C-G formula based on SCr of 0.69 mg/dL). Liver Function Tests: Recent Labs  Lab 11/13/23 1225  AST 27  ALT 15  ALKPHOS 63  BILITOT 0.5  PROT 8.1  ALBUMIN 3.8   No results for input(s): "LIPASE", "AMYLASE" in the last 168 hours. No results for input(s): "AMMONIA" in the last 168 hours. Coagulation Profile: Recent Labs  Lab 11/13/23 1225  INR 1.0   Cardiac Enzymes: No results for input(s): "CKTOTAL", "CKMB", "CKMBINDEX", "TROPONINI" in the last 168 hours. BNP (last 3 results) No results for input(s): "PROBNP" in the last 8760 hours. HbA1C: No results for input(s): "HGBA1C" in the last 72 hours. CBG: No results for input(s): "GLUCAP" in the last 168 hours. Lipid Profile: No results for input(s): "CHOL", "HDL", "LDLCALC", "TRIG", "CHOLHDL", "LDLDIRECT" in the last 72 hours. Thyroid Function Tests: No results for input(s): "TSH", "T4TOTAL", "FREET4", "T3FREE", "THYROIDAB" in the last 72 hours. Anemia Panel: No results for input(s): "VITAMINB12", "FOLATE", "FERRITIN", "TIBC", "IRON", "RETICCTPCT" in the last 72 hours. Urine analysis:    Component Value Date/Time   COLORURINE YELLOW (A) 12/18/2022 0725   APPEARANCEUR HAZY (A) 12/18/2022 0725   APPEARANCEUR Clear 07/18/2016 0927   LABSPEC 1.024 12/18/2022 0725   PHURINE 5.0 12/18/2022 0725   GLUCOSEU NEGATIVE 12/18/2022 0725   HGBUR NEGATIVE 12/18/2022 0725   BILIRUBINUR  NEGATIVE 12/18/2022 0725   BILIRUBINUR negative 09/07/2020 1505   BILIRUBINUR Negative 07/18/2016 0927   KETONESUR NEGATIVE 12/18/2022 0725   PROTEINUR 30 (A) 12/18/2022 0725   UROBILINOGEN 0.2 09/07/2020 1505   NITRITE NEGATIVE 12/18/2022 0725   LEUKOCYTESUR NEGATIVE 12/18/2022 0725    Radiological Exams on Admission: CT ANGIO HEAD NECK W WO CM Result Date: 11/14/2023 CLINICAL DATA:  abnormal mri. EXAM: CT ANGIOGRAPHY HEAD AND NECK WITH AND WITHOUT CONTRAST TECHNIQUE: Multidetector CT imaging of the head and neck was performed using the standard protocol during bolus administration of intravenous contrast. Multiplanar CT image reconstructions and MIPs were obtained to evaluate the vascular anatomy. Carotid stenosis measurements (when applicable) are obtained utilizing NASCET criteria, using the distal internal carotid diameter as the denominator. RADIATION DOSE REDUCTION: This exam was performed according to the departmental dose-optimization program which includes automated exposure control, adjustment of the mA and/or kV according to patient size and/or use of iterative reconstruction technique. CONTRAST:  75mL OMNIPAQUE IOHEXOL 350 MG/ML SOLN COMPARISON:  CTA head/neck March 3, 22. FINDINGS: CTA NECK FINDINGS Aortic arch: Great vessel origins are patent without significant stenosis. Right carotid system: No evidence of dissection, stenosis (50% or greater), or occlusion. Left carotid system: No evidence of dissection, stenosis (50% or greater), or occlusion. Vertebral arteries: Approximately 60% stenosis of the proximal right V2 vertebral artery. Skeleton: No acute abnormality limited assessment. Other neck: No acute abnormality on limited assessment. Upper chest: Visualized lung apices are clear. Review of the MIP images confirms the above findings CTA HEAD FINDINGS Anterior circulation: Bilateral intracranial ICAs, MCAs and ACAs are patent without proximal hemodynamically significant stenosis.  Hypoplastic left A1 ACA. Posterior circulation: Occluded left V3 vertebral artery. Right intradural vertebral artery is patent with moderate stenosis. Left vertebral artery is not opacified. Basilar artery and bilateral posterior cerebral arteries are patent. Venous sinuses: Not well assessed due to non arterial timing. At Review of the MIP images confirms the above findings IMPRESSION: 1. Occluded left V3 vertebral artery which could be due to atherosclerosis or dissection.  This finding is age indeterminate but new since 2022. 2. Approximately 60% stenosis of the proximal right V2 vertebral artery. Electronically Signed   By: Feliberto Harts M.D.   On: 11/14/2023 01:31   MR BRAIN WO CONTRAST Result Date: 11/13/2023 CLINICAL DATA:  Provided history: Aphagia. Additional history provided: Slurred speech, left-sided weakness. EXAM: MRI HEAD WITHOUT CONTRAST TECHNIQUE: Multiplanar, multiecho pulse sequences of the brain and surrounding structures were obtained without intravenous contrast. COMPARISON:  Non-contrast head CT 11/13/2023.  Brain MRI 11/30/2020. FINDINGS: Brain: Moderate generalized cerebral atrophy. Mild cerebellar atrophy. Commensurate prominence of the ventricles and sulci. Chronic lacunar infarcts within the bilateral cerebral hemispheric white matter and within/about the bilateral deep gray nuclei. Chronic infarct within the pons (involving much of the pontine cross-section). Adjacent Wallerian degeneration. Chronic infarct within the left ventral aspect of the medulla, new from the prior MRI of 11/30/2020 (series 8, image 4). Tiny chronic infarcts within the inferior right cerebellar hemisphere. Patchy and confluent T2 FLAIR hyperintense signal abnormality elsewhere within the cerebral white matter, nonspecific but compatible with chronic small vessel ischemic disease. Chronic microhemorrhages within the bilateral thalami. Small chronic hemorrhage within the right external capsule. There is no  acute infarct. No evidence of an intracranial mass. No extra-axial fluid collection. No midline shift. Vascular: Signal abnormality within the left vertebral artery at the V3/V4 junction, which may reflect high-grade stenosis or vessel occlusion, new from the prior brain MRI 11/30/2020. Skull and upper cervical spine: No focal worrisome marrow lesion. Sinuses/Orbits: No mass or acute finding within the imaged orbits. Prior right ocular lens replacement. No significant paranasal sinus disease. IMPRESSION: 1. No evidence of an acute or recent subacute infarction. 2. Parenchymal atrophy, advanced chronic small vessel ischemic disease and chronic infarcts, as described. A chronic infarct within the left ventral aspect of the medulla is new from the prior MRI of 11/30/2020. 3. Also new from the prior MRI, there is signal abnormality within the left vertebral artery at the V3/V4 junction, which may reflect high-grade stenosis or vessel occlusion. Consider CT or MR angiography for further evaluation. 4. Small chronic hemorrhage within the right external capsule. 5. Chronic microhemorrhages within the bilateral thalami, likely reflecting sequelae of chronic hypertensive microangiopathy. Electronically Signed   By: Jackey Loge D.O.   On: 11/13/2023 18:25   US Venous Img Lower Unilateral Right Result Date: 11/13/2023 CLINICAL DATA:  Right lower extremity edema EXAM: RIGHT LOWER EXTREMITY VENOUS DOPPLER ULTRASOUND TECHNIQUE: Gray-scale sonography with compression, as well as color and duplex ultrasound, were performed to evaluate the deep venous system(s) from the level of the common femoral vein through the popliteal and proximal calf veins. COMPARISON:  None Available. FINDINGS: VENOUS Normal compressibility of the common femoral, superficial femoral, and popliteal veins, as well as the visualized calf veins. Visualized portions of profunda femoral vein and great saphenous vein unremarkable. No filling defects to suggest  DVT on grayscale or color Doppler imaging. Doppler waveforms show normal direction of venous flow, normal respiratory plasticity and response to augmentation. Limited views of the contralateral common femoral vein are unremarkable. OTHER None. Limitations: none IMPRESSION: Negative. Electronically Signed   By: Malachy Moan M.D.   On: 11/13/2023 16:40   CT HEAD WO CONTRAST Result Date: 11/13/2023 CLINICAL DATA:  Neuro deficit, acute, stroke suspected. Slurred speech and left-sided weakness. History of stroke. EXAM: CT HEAD WITHOUT CONTRAST TECHNIQUE: Contiguous axial images were obtained from the base of the skull through the vertex without intravenous contrast. RADIATION DOSE REDUCTION: This exam  was performed according to the departmental dose-optimization program which includes automated exposure control, adjustment of the mA and/or kV according to patient size and/or use of iterative reconstruction technique. COMPARISON:  Head CT and MRI 11/30/2020 FINDINGS: Brain: There is no evidence of an acute infarct, intracranial hemorrhage, mass, midline shift, or extra-axial fluid collection. Patchy and confluent hypodensities in the cerebral white matter bilaterally are similar to the prior CT and are nonspecific but compatible with severe chronic small vessel ischemic disease. Chronic lacunar infarcts are again seen involving the deep gray nuclei and white matter bilaterally. There is mild cerebral atrophy. Vascular: Calcified atherosclerosis at the skull base. No hyperdense vessel. Skull: No acute fracture or suspicious osseous lesion. Sinuses/Orbits: Paranasal sinuses and mastoid air cells are clear. Right cataract extraction. Other: None. IMPRESSION: 1. No evidence of acute intracranial abnormality. 2. Severe chronic small vessel ischemic disease. Electronically Signed   By: Sebastian Ache M.D.   On: 11/13/2023 13:33     Data Reviewed: Relevant notes from primary care and specialist visits, past discharge  summaries as available in EHR, including Care Everywhere. Prior diagnostic testing as pertinent to current admission diagnoses Updated medications and problem lists for reconciliation ED course, including vitals, labs, imaging, treatment and response to treatment Triage notes, nursing and pharmacy notes and ED provider's notes Notable results as noted in HPI   Assessment and Plan: * TIA (transient ischemic attack) History of stroke with baseline slurred speech No acute findings on MRI but areas of high-grade stenosis, multiple chronic microhemorrhages Continue atorvastatin and apixaban Neurology consult for any additional recommendations PT OT and TOC consults  Chronic systolic CHF (congestive heart failure) (HCC) Clinically compensated Continue GDMT  PAF (paroxysmal atrial fibrillation) (HCC) Continue carvedilol and apixaban  Uncontrolled type 2 diabetes mellitus with hyperglycemia, without long-term current use of insulin (HCC) Sliding scale insulin coverage  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 Inpatient status is judged to be reasonable and necessary in order to provide the required intensity of service to ensure the patient's safety. The patient's presenting symptoms, physical exam findings, and initial radiographic and laboratory data in the context of their chronic comorbidities is felt to place them at high risk for further clinical deterioration. Furthermore, it is not anticipated that the patient will be medically stable for discharge from the hospital within 2 midnights of  admission.   * I certify that at the point of admission it is my clinical judgment that the patient will require inpatient hospital care spanning beyond 2 midnights from the point of admission due to high intensity of service, high risk for further deterioration and high frequency of surveillance required.*  Author: Andris Baumann, MD 11/14/2023 3:16 AM  For on call review [email or web address].

## 2023-11-14 NOTE — Plan of Care (Signed)
  Problem: Education: Goal: Ability to describe self-care measures that may prevent or decrease complications (Diabetes Survival Skills Education) will improve Outcome: Progressing Goal: Individualized Educational Video(s) Outcome: Progressing   Problem: Coping: Goal: Ability to adjust to condition or change in health will improve Outcome: Progressing   Problem: Fluid Volume: Goal: Ability to maintain a balanced intake and output will improve Outcome: Progressing   Problem: Health Behavior/Discharge Planning: Goal: Ability to identify and utilize available resources and services will improve Outcome: Progressing   Problem: Nutritional: Goal: Maintenance of adequate nutrition will improve Outcome: Progressing Goal: Progress toward achieving an optimal weight will improve Outcome: Progressing

## 2023-11-15 ENCOUNTER — Observation Stay (HOSPITAL_COMMUNITY)
Admit: 2023-11-15 | Discharge: 2023-11-15 | Disposition: A | Discharge status: Home / Self Care | Payer: Medicare Other | Attending: Internal Medicine | Admitting: Internal Medicine

## 2023-11-15 DIAGNOSIS — E1165 Type 2 diabetes mellitus with hyperglycemia: Secondary | ICD-10-CM

## 2023-11-15 DIAGNOSIS — G459 Transient cerebral ischemic attack, unspecified: Secondary | ICD-10-CM | POA: Diagnosis not present

## 2023-11-15 DIAGNOSIS — I6930 Unspecified sequelae of cerebral infarction: Secondary | ICD-10-CM

## 2023-11-15 DIAGNOSIS — I480 Paroxysmal atrial fibrillation: Secondary | ICD-10-CM

## 2023-11-15 DIAGNOSIS — R600 Localized edema: Secondary | ICD-10-CM | POA: Diagnosis not present

## 2023-11-15 DIAGNOSIS — R4781 Slurred speech: Principal | ICD-10-CM

## 2023-11-15 DIAGNOSIS — I5022 Chronic systolic (congestive) heart failure: Secondary | ICD-10-CM

## 2023-11-15 DIAGNOSIS — L8990 Pressure ulcer of unspecified site, unspecified stage: Secondary | ICD-10-CM | POA: Insufficient documentation

## 2023-11-15 DIAGNOSIS — C8333 Diffuse large B-cell lymphoma, intra-abdominal lymph nodes: Secondary | ICD-10-CM

## 2023-11-15 LAB — ECHOCARDIOGRAM COMPLETE BUBBLE STUDY
AR max vel: 2.54 cm2
AV Peak grad: 6.4 mm[Hg]
Ao pk vel: 1.27 m/s
Area-P 1/2: 6.12 cm2
S' Lateral: 4.7 cm
Single Plane A4C EF: 30.4 %

## 2023-11-15 LAB — GLUCOSE, CAPILLARY
Glucose-Capillary: 194 mg/dL — ABNORMAL HIGH (ref 70–99)
Glucose-Capillary: 192 mg/dL — ABNORMAL HIGH (ref 70–99)
Glucose-Capillary: 227 mg/dL — ABNORMAL HIGH (ref 70–99)
Glucose-Capillary: 219 mg/dL — ABNORMAL HIGH (ref 70–99)

## 2023-11-15 MED ORDER — PERFLUTREN LIPID MICROSPHERE
1.0000 mL | INTRAVENOUS | Status: AC | PRN
  Administered 2023-11-15: 6 mL via INTRAVENOUS

## 2023-11-15 MED ORDER — ZINC OXIDE 40 % EX OINT
TOPICAL_OINTMENT | Freq: Once | CUTANEOUS | Status: AC
  Filled 2023-11-15: qty 113

## 2023-11-15 MED ORDER — IRBESARTAN 150 MG PO TABS
75.0000 mg | ORAL_TABLET | Freq: Every day | ORAL | Status: DC
  Administered 2023-11-15 – 2023-11-20 (×6): 75 mg via ORAL
  Filled 2023-11-15 (×6): qty 1

## 2023-11-15 NOTE — Progress Notes (Signed)
Speech Language Pathology Treatment: Dysphagia  Patient Details Name: Peter Ryan MRN: [id number] DOB: 1950/12/06 Today's Date: 11/15/2023 Time: 4098-1191 SLP Time Calculation (min) (ACUTE ONLY): 25 min  Assessment / Plan / Recommendation Clinical Impression  Pt seen for follow up dysphagia intervention following bedside swallow assessment on 11/14/23. Pt seen with trials of regular solids and thin liquids (ice cream). Min verbal cues provided for managing slow rate, small bites/sips. No s/sx of aspiration noted. Spouse/pt reported that current diet (mech soft) is grossly consistent with baseline. Further RN and spouse denied overt issue with intake since diet initiation. Therefore, recommend continued Dys 3 (mech soft) diet with thin liquids and aspiration precautions (slow rate, small bites, elevated HOB, and alert for PO intake). Limit use of straws to aid oral control. Pt endorsed ease of medication administration in puree- recommend continued practice. Set up and intermittent supervision for compliance with aspiration precautions recommended. Pt/spouse report understanding across education on recommendations.  Pt also screened for cognitive linguistic deficits, specifically regarding intermittent "slurred speech." Pt and spouse endorsed that speech is currently at baseline- pt reported speech is "average." Neither identified acute concerns for cognition/language. Education shared regarding potential further assessment in rehab setting if concerns arise. Pt/spouse endorsed understanding. No further acute services indicated at this time.     HPI HPI: Pt admitted to [facility]/13/23 for increased weakness and slurred speech(which has been ongoing for ~1 month "off/on") per caregiver. Significant PMH includes:  prior stroke in 05/2020 (residual R hemiparesis and dysarthria), liver cirrhosis, Obesity, diffuse large B-cell lymphoma s/p 6 cycles R-CHOP, T2DM, HTN, HLD, IgA MGUS, BPH, DM, dyslipidemia,  chronic prostatitis, gout, bedbound but can sit in the recliner, history of non-Hodgkin's lymphoma, history of thoracentesis, chronic hypokalemia, obesity, chronic anemia.   MRI: No evidence of an acute or recent subacute infarction.  2. Parenchymal atrophy, advanced chronic small vessel ischemic  disease and chronic infarcts, as described. A chronic infarct within  the left ventral aspect of the medulla is new from the prior MRI of  11/30/2020.  3. Also new from the prior MRI, there is signal abnormality within  the left vertebral artery at the V3/V4 junction, which may reflect  high-grade stenosis or vessel occlusion. Consider CT or MR  angiography for further evaluation.  4. Small chronic hemorrhage within the right external capsule.  5. Chronic microhemorrhages within the bilateral thalami, likely  reflecting sequelae of chronic hypertensive microangiopathy.      SLP Plan  All goals met (no further acute needs)      Recommendations for follow up therapy are one component of a multi-disciplinary discharge planning process, led by the attending physician.  Recommendations may be updated based on patient status, additional functional criteria and insurance authorization.    Recommendations  Diet recommendations: Dysphagia 3 (mechanical soft);Thin liquid Liquids provided via: Cup;No straw Medication Administration: Whole meds with puree Supervision: Intermittent supervision to cue for compensatory strategies Compensations: Minimize environmental distractions;Slow rate;Small sips/bites Postural Changes and/or Swallow Maneuvers: Seated upright 90 degrees                  Oral care BID;Patient independent with oral care   Intermittent Supervision/Assistance Dysphagia, pharyngeal phase (R13.13)     All goals met (no further acute needs)   [place] Abisai Coble Clapp, MS, CCC-SLP Speech Language Pathologist Rehab Services; [facility] -  [phone] (ascom)    [place] J  Clapp  11/15/2023, 1:40 PM

## 2023-11-15 NOTE — Progress Notes (Signed)
Progress Note   Patient: Peter Ryan ZOX:[id number] DOB: 02/28/1951 DOA: 11/13/2023     0 DOS: the patient was seen and examined on 11/15/2023   Brief hospital course: Taken from prior notes.  [name] is a 73 y.o. male with medical history significant for stroke with right-sided hemiparesis , paroxysmal atrial fibrillation, systolic CHF, hypertension, diabetes mellitus,  gout, BPH,  history of non-Hodgkin's lymphoma, who presented to the ED with a several week increased slurring of speech noted by his caregiver..patient states for the past year he has intermittently had slurred speech especially on awaking in the morning.  He feels like it is no different from the baseline however his caregiver got concerned that it was more slurred than usual.  Patient admitted to the hospital due to concerns for an acute stroke, MRI of the brain shows no evidence of an acute or recent subacute infarction. Parenchymal atrophy, advanced chronic small vessel ischemic disease and chronic infarcts, as described. A chronic infarct within the left ventral aspect of the medulla is new from the prior MRI of 11/30/2020. Also new from the prior MRI, there is signal abnormality within the left vertebral artery at the V3/V4 junction, which may reflect high-grade stenosis or vessel occlusion. Consider CT or MR angiography for further evaluation. Small chronic hemorrhage within the right external capsule. Chronic microhemorrhages within the bilateral thalami, likely reflecting sequelae of chronic hypertensive microangiopathy. CT angiogram of the head and neck shows occluded left V3 vertebral artery which could be due to atherosclerosis or dissection. This finding is age indeterminate but new since 2022.  Approximately 60% stenosis of the proximal right V2 vertebral artery.  Neurology evaluated him. patient has a CHA2DS2-VASc score of 5 and overall burden of microhemorrhages is relatively low.  Continue Eliquis since the  risk of recurrent thromboembolic event if anticoagulation is held is felt to be greater than the risk for intracranial hemorrhage if anticoagulation is continued. Patient was seen by OT and they recommend SNF.  2/15: Remained hemodynamically stable.  Wife at bedside side wants him to go to rehab.    Assessment and Plan: * TIA (transient ischemic attack) History of stroke with baseline slurred speech No acute findings on MRI but areas of high-grade stenosis, multiple chronic microhemorrhages Continue atorvastatin and apixaban Imaging negative for stroke, likely a TIA PT and OT are recommending SNF  Chronic systolic CHF (congestive heart failure) (HCC) Clinically compensated Continue GDMT  PAF (paroxysmal atrial fibrillation) (HCC) Continue carvedilol and apixaban  Uncontrolled type 2 diabetes mellitus with hyperglycemia, without long-term current use of insulin (HCC) Sliding scale insulin coverage  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  No new concern.  Wife at bedside.  Mild edema of right upper and lower extremity but no pain, tenderness or erythema.  Per wife patient to get edema if lie on that side  Physical Exam: Vitals:   11/15/23 0022 11/15/23 0247 11/15/23 0851 11/15/23 1217  BP: (!) 149/89 (!) 159/89 (!) 167/73 (!) 157/89  Pulse: 82 88 91 91  Resp: 18 18 16 16   Temp: 98 F (36.7 C) 98.8 F (37.1 C) 97.8 F (36.6 C) 98.6 F (37 C)  TempSrc:   Oral Oral  SpO2: 95% 96% 91% 96%  Weight:      Height:       General.  Frail gentleman, in no acute distress. Pulmonary.  Lungs clear bilaterally, normal  respiratory effort. CV.  Regular rate and rhythm, no JVD, rub or murmur. Abdomen.  Soft, nontender, nondistended, BS positive. CNS.  Alert and oriented .  No focal neurologic deficit. Extremities.  Trace right upper and lower extremity edema, no cyanosis,  pulses intact and symmetrical.  Data Reviewed: Prior data reviewed  Family Communication: Discussed with wife at bedside  Disposition: Status is: Observation The patient will require care spanning > 2 midnights and should be moved to inpatient because: Severity of illness  Planned Discharge Destination: Skilled nursing facility  DVT prophylaxis.  Eliquis Time spent: 42 minutes  This record has been created using Conservation officer, historic buildings. Errors have been sought and corrected,but may not always be located. Such creation errors do not reflect on the standard of care.   Author: Arnetha Courser, MD 11/15/2023 2:20 PM  For on call review [email or web address].

## 2023-11-15 NOTE — Plan of Care (Signed)

## 2023-11-15 NOTE — Progress Notes (Signed)
  Echocardiogram 2D Echocardiogram has been performed. Definity IV ultrasound imaging agent used on this study.  Peter Ryan 11/15/2023, 8:32 AM

## 2023-11-15 NOTE — Plan of Care (Signed)
  Problem: Education: Goal: Ability to describe self-care measures that may prevent or decrease complications (Diabetes Survival Skills Education) will improve Outcome: Progressing Goal: Individualized Educational Video(s) Outcome: Progressing   Problem: Coping: Goal: Ability to adjust to condition or change in health will improve Outcome: Progressing   Problem: Fluid Volume: Goal: Ability to maintain a balanced intake and output will improve Outcome: Progressing   Problem: Health Behavior/Discharge Planning: Goal: Ability to identify and utilize available resources and services will improve Outcome: Progressing Goal: Ability to manage health-related needs will improve Outcome: Progressing   Problem: Nutritional: Goal: Maintenance of adequate nutrition will improve Outcome: Progressing Goal: Progress toward achieving an optimal weight will improve Outcome: Progressing   

## 2023-11-15 NOTE — Hospital Course (Signed)
Taken from prior notes.  Peter Ryan is a 73 y.o. male with medical history significant for stroke with right-sided hemiparesis , paroxysmal atrial fibrillation, systolic CHF, hypertension, diabetes mellitus,  gout, BPH,  history of non-Hodgkin's lymphoma, who presented to the ED with a several week increased slurring of speech noted by his caregiver..patient states for the past year he has intermittently had slurred speech especially on awaking in the morning.  He feels like it is no different from the baseline however his caregiver got concerned that it was more slurred than usual.  Patient admitted to the hospital due to concerns for an acute stroke, MRI of the brain shows no evidence of an acute or recent subacute infarction. Parenchymal atrophy, advanced chronic small vessel ischemic disease and chronic infarcts, as described. A chronic infarct within the left ventral aspect of the medulla is new from the prior MRI of 11/30/2020. Also new from the prior MRI, there is signal abnormality within the left vertebral artery at the V3/V4 junction, which may reflect high-grade stenosis or vessel occlusion. Consider CT or MR angiography for further evaluation. Small chronic hemorrhage within the right external capsule. Chronic microhemorrhages within the bilateral thalami, likely reflecting sequelae of chronic hypertensive microangiopathy. CT angiogram of the head and neck shows occluded left V3 vertebral artery which could be due to atherosclerosis or dissection. This finding is age indeterminate but new since 2022.  Approximately 60% stenosis of the proximal right V2 vertebral artery.  Neurology evaluated him. patient has a CHA2DS2-VASc score of 5 and overall burden of microhemorrhages is relatively low.  Continue Eliquis since the risk of recurrent thromboembolic event if anticoagulation is held is felt to be greater than the risk for intracranial hemorrhage if anticoagulation is continued. Patient was  seen by OT and they recommend SNF.  2/15: Remained hemodynamically stable.  Wife at bedside side wants him to go to rehab.  2/20: Remained hemodynamically stable.  CBG is still mildly elevated.  A1c of 8.3, patient likely have uncontrolled diabetes with hyperglycemia.  Apparently was not on any antidiabetics at home.  Patient was started on Semglee and NovoLog while in the hospital and is being continued with Smitley at 15 units daily and NovoLog at 5 units with meals.  Patient need close monitoring of his blood glucose level and make adjustment by PCP as needed.  Patient also has an history of HFrEF with last echocardiogram showed an LVEF of 25 to 30% with decreased LV systolic function. Continue GDMT as BP tolerates.  And he will follow-up with heart failure clinic.  Patient has paroxysmal A-fib for which she will continue with carvedilol and Eliquis.  History of diffuse large B-cell lymphoma of intra-abdominal lymph node for which she will continue follow-up with outpatient oncology  Patient has some decubitus stage II ulcers for which she will continue with wound care.  Patient developed mild hypokalemia while taking diuretic and requiring supplement, he was started on daily potassium 20 mEq, patient also take spironolactone and ARB so potassium need to be monitored.  Patient will continue on current medications and need to have a close follow-up with his providers for further assistance.

## 2023-11-16 DIAGNOSIS — I6930 Unspecified sequelae of cerebral infarction: Secondary | ICD-10-CM | POA: Diagnosis not present

## 2023-11-16 DIAGNOSIS — I69351 Hemiplegia and hemiparesis following cerebral infarction affecting right dominant side: Secondary | ICD-10-CM | POA: Diagnosis not present

## 2023-11-16 DIAGNOSIS — Z66 Do not resuscitate: Secondary | ICD-10-CM | POA: Diagnosis present

## 2023-11-16 DIAGNOSIS — N400 Enlarged prostate without lower urinary tract symptoms: Secondary | ICD-10-CM | POA: Diagnosis present

## 2023-11-16 DIAGNOSIS — K7460 Unspecified cirrhosis of liver: Secondary | ICD-10-CM | POA: Diagnosis present

## 2023-11-16 DIAGNOSIS — K219 Gastro-esophageal reflux disease without esophagitis: Secondary | ICD-10-CM | POA: Diagnosis present

## 2023-11-16 DIAGNOSIS — I6502 Occlusion and stenosis of left vertebral artery: Secondary | ICD-10-CM | POA: Diagnosis present

## 2023-11-16 DIAGNOSIS — C8333 Diffuse large B-cell lymphoma, intra-abdominal lymph nodes: Secondary | ICD-10-CM | POA: Diagnosis present

## 2023-11-16 DIAGNOSIS — R4781 Slurred speech: Secondary | ICD-10-CM | POA: Diagnosis present

## 2023-11-16 DIAGNOSIS — L89312 Pressure ulcer of right buttock, stage 2: Secondary | ICD-10-CM | POA: Diagnosis present

## 2023-11-16 DIAGNOSIS — L89322 Pressure ulcer of left buttock, stage 2: Secondary | ICD-10-CM | POA: Diagnosis present

## 2023-11-16 DIAGNOSIS — R4701 Aphasia: Secondary | ICD-10-CM | POA: Diagnosis present

## 2023-11-16 DIAGNOSIS — E876 Hypokalemia: Secondary | ICD-10-CM | POA: Diagnosis not present

## 2023-11-16 DIAGNOSIS — R0682 Tachypnea, not elsewhere classified: Secondary | ICD-10-CM | POA: Diagnosis not present

## 2023-11-16 DIAGNOSIS — I69328 Other speech and language deficits following cerebral infarction: Secondary | ICD-10-CM | POA: Diagnosis not present

## 2023-11-16 DIAGNOSIS — E1165 Type 2 diabetes mellitus with hyperglycemia: Secondary | ICD-10-CM | POA: Diagnosis present

## 2023-11-16 DIAGNOSIS — G459 Transient cerebral ischemic attack, unspecified: Secondary | ICD-10-CM | POA: Diagnosis not present

## 2023-11-16 DIAGNOSIS — M109 Gout, unspecified: Secondary | ICD-10-CM | POA: Diagnosis present

## 2023-11-16 DIAGNOSIS — R600 Localized edema: Secondary | ICD-10-CM | POA: Diagnosis present

## 2023-11-16 DIAGNOSIS — I480 Paroxysmal atrial fibrillation: Secondary | ICD-10-CM | POA: Diagnosis present

## 2023-11-16 DIAGNOSIS — R54 Age-related physical debility: Secondary | ICD-10-CM | POA: Diagnosis present

## 2023-11-16 DIAGNOSIS — Z794 Long term (current) use of insulin: Secondary | ICD-10-CM | POA: Diagnosis not present

## 2023-11-16 DIAGNOSIS — I5023 Acute on chronic systolic (congestive) heart failure: Secondary | ICD-10-CM | POA: Diagnosis not present

## 2023-11-16 DIAGNOSIS — T502X5A Adverse effect of carbonic-anhydrase inhibitors, benzothiadiazides and other diuretics, initial encounter: Secondary | ICD-10-CM | POA: Diagnosis not present

## 2023-11-16 DIAGNOSIS — I110 Hypertensive heart disease with heart failure: Secondary | ICD-10-CM | POA: Diagnosis present

## 2023-11-16 DIAGNOSIS — I509 Heart failure, unspecified: Secondary | ICD-10-CM

## 2023-11-16 DIAGNOSIS — E785 Hyperlipidemia, unspecified: Secondary | ICD-10-CM | POA: Diagnosis present

## 2023-11-16 LAB — GLUCOSE, CAPILLARY
Glucose-Capillary: 176 mg/dL — ABNORMAL HIGH (ref 70–99)
Glucose-Capillary: 196 mg/dL — ABNORMAL HIGH (ref 70–99)
Glucose-Capillary: 196 mg/dL — ABNORMAL HIGH (ref 70–99)
Glucose-Capillary: 221 mg/dL — ABNORMAL HIGH (ref 70–99)
Glucose-Capillary: 192 mg/dL — ABNORMAL HIGH (ref 70–99)

## 2023-11-16 LAB — CBC WITH DIFFERENTIAL/PLATELET
Abs Immature Granulocytes: 0.03 10*3/uL (ref 0.00–0.07)
Basophils Absolute: 0.1 10*3/uL (ref 0.0–0.1)
Basophils Relative: 1 %
Eosinophils Absolute: 0.4 10*3/uL (ref 0.0–0.5)
Eosinophils Relative: 5 %
HCT: 42.6 % (ref 39.0–52.0)
Hemoglobin: 14.3 g/dL (ref 13.0–17.0)
Immature Granulocytes: 0 %
Lymphocytes Relative: 19 %
Lymphs Abs: 1.8 10*3/uL (ref 0.7–4.0)
MCH: 28.8 pg (ref 26.0–34.0)
MCHC: 33.6 g/dL (ref 30.0–36.0)
MCV: 85.7 fL (ref 80.0–100.0)
Monocytes Absolute: 0.7 10*3/uL (ref 0.1–1.0)
Monocytes Relative: 8 %
Neutro Abs: 6.3 10*3/uL (ref 1.7–7.7)
Neutrophils Relative %: 67 %
Platelets: 208 10*3/uL (ref 150–400)
RBC: 4.97 MIL/uL (ref 4.22–5.81)
RDW: 15 % (ref 11.5–15.5)
WBC: 9.3 10*3/uL (ref 4.0–10.5)
nRBC: 0 % (ref 0.0–0.2)

## 2023-11-16 LAB — COMPREHENSIVE METABOLIC PANEL
ALT: 15 U/L (ref 0–44)
AST: 15 U/L (ref 15–41)
Albumin: 3.3 g/dL — ABNORMAL LOW (ref 3.5–5.0)
Alkaline Phosphatase: 55 U/L (ref 38–126)
Anion gap: 12 (ref 5–15)
BUN: 15 mg/dL (ref 8–23)
CO2: 24 mmol/L (ref 22–32)
Calcium: 8.5 mg/dL — ABNORMAL LOW (ref 8.9–10.3)
Chloride: 104 mmol/L (ref 98–111)
Creatinine, Ser: 0.76 mg/dL (ref 0.61–1.24)
GFR, Estimated: >60 mL/min (ref >60)
Glucose, Bld: 200 mg/dL — ABNORMAL HIGH (ref 70–99)
Potassium: 2.8 mmol/L — ABNORMAL LOW (ref 3.5–5.1)
Sodium: 140 mmol/L (ref 135–145)
Total Bilirubin: 0.7 mg/dL (ref 0.0–1.2)
Total Protein: 7 g/dL (ref 6.5–8.1)

## 2023-11-16 MED ORDER — HYDROXYZINE HCL 50 MG PO TABS
25.0000 mg | ORAL_TABLET | Freq: Once | ORAL | Status: AC
  Administered 2023-11-16: 25 mg via ORAL
  Filled 2023-11-16: qty 1

## 2023-11-16 MED ORDER — FUROSEMIDE 10 MG/ML IJ SOLN
40.0000 mg | Freq: Two times a day (BID) | INTRAMUSCULAR | Status: DC
  Administered 2023-11-16 – 2023-11-17 (×3): 40 mg via INTRAVENOUS
  Filled 2023-11-16 (×3): qty 4

## 2023-11-16 MED ORDER — POTASSIUM CHLORIDE 20 MEQ PO PACK
40.0000 meq | PACK | ORAL | Status: AC
Start: 2023-11-16 — End: 2023-11-16
  Administered 2023-11-16 (×2): 40 meq via ORAL
  Filled 2023-11-16 (×2): qty 2

## 2023-11-16 NOTE — TOC Initial Note (Signed)
Transition of Care [facility]) - Initial/Assessment Note    Patient Details  Name: Peter Ryan MRN: [id number] Date of Birth: 09-16-1951  Transition of Care [facility]) CM/SW Contact:    [name], RN Phone Number: 11/16/2023, 12:06 PM  Clinical Narrative:                  Spoke with patient and wife, admitted from home, wife report has [facility] already active, however per Herbert Seta, he is not currently active, will need new referral if patient/family agreeable.  Alcide Clever, NP with [facility] is PCP, medications from [facility].  Has DME in the home include lift recliner, BSC, walker, and wheelchair.    Patient is bed/chair bound and wife does not feel she is able to care for him anymore.  She has requested SNF for rehab, but feels he will need more LTC placement.  Advised that patient is in observation status and does not qualify for SNF placement at this time as he does not have a 3 night qualifying stay as inpatient.  She inquires about having the diagnosis changed to qualify for inpatient, advised this is not likely and this RNCM is unable to do so.  MOON letter and obs vs inpatient status explained, she has refused to sign document, stating she would like to speak with MD.   Attempted to discuss plan for [facility] services as he does not meet criteria for SNF placement, she state taking patient home with home health is not an option and request again to speak with MD.  Attending notified.  Expected Discharge Plan: Home w Home Health Services Barriers to Discharge: Continued Medical Work up   Patient Goals and CMS Choice Patient states their goals for this hospitalization and ongoing recovery are:: Home with home health          Expected Discharge Plan and Services     Post Acute Care Choice: Home Health Living arrangements for the past 2 months: Single Family Home                           HH Arranged: PT, OT HH Agency: [facility] ([facility]) Date HH  Agency Contacted: 11/16/23 Time HH Agency Contacted: 1205 Representative spoke with at [facility] Agency: Herbert Seta (Patient already active, notified of admission and plan to restart services)  Prior Living Arrangements/Services Living arrangements for the past 2 months: Single Family Home Lives with:: Spouse Patient language and need for interpreter reviewed:: Yes Do you feel safe going back to the place where you live?: No   Per wife, patient will need LTC placement  Need for Family Participation in Patient Care: Yes (Comment) Care giver support system in place?: Yes (comment) Current home services: DME, Home OT, Home PT Criminal Activity/Legal Involvement Pertinent to Current Situation/Hospitalization: No - Comment as needed  Activities of Daily Living   ADL Screening (condition at time of admission) Independently performs ADLs?: No Does the patient have a NEW difficulty with bathing/dressing/toileting/self-feeding that is expected to last >3 days?: No Does the patient have a NEW difficulty with getting in/out of bed, walking, or climbing stairs that is expected to last >3 days?: No Does the patient have a NEW difficulty with communication that is expected to last >3 days?: No Is the patient deaf or have difficulty hearing?: No Does the patient have difficulty seeing, even when wearing glasses/contacts?: No Does the patient have difficulty concentrating, remembering, or making decisions?: No  Permission  Sought/Granted   Permission granted to share information with : Yes, Verbal Permission Granted              Emotional Assessment       Orientation: : Oriented to Self, Oriented to Place, Oriented to  Time   Psych Involvement: No (comment)  Admission diagnosis:  TIA (transient ischemic attack) [G45.9] Slurred speech [R47.81] Lower extremity edema [R60.0] Patient Active Problem List   Diagnosis Date Noted   Slurred speech 11/15/2023   Lower extremity edema 11/15/2023   Pressure  injury of skin 11/15/2023   Chronic systolic CHF (congestive heart failure) (HCC) 11/14/2023   Dilated cardiomyopathy (HCC) 12/22/2022   Elevated brain natriuretic peptide (BNP) level 12/21/2022   Hypervolemia 12/21/2022   PAF (paroxysmal atrial fibrillation) (HCC) 12/20/2022   Pleural effusion, bilateral 12/18/2022   Acute on chronic diastolic CHF (congestive heart failure) (HCC) 12/16/2022   Prolonged QT interval 12/16/2022   Symptomatic anemia 02/07/2022   Hypokalemia 02/07/2022   TIA (transient ischemic attack) 12/01/2020   Right sided weakness 11/30/2020   Hypertension associated with diabetes (HCC) 11/30/2020   History of stroke with residual deficit 11/30/2020   Left pontine cerebrovascular accident (HCC) 06/14/2020   Facial droop    Rectal bleeding    Morbid obesity (HCC)    Right hemiparesis (HCC)    Uncontrolled type 2 diabetes mellitus with hyperglycemia, without long-term current use of insulin (HCC)    Acute CVA (cerebrovascular accident) (HCC) 06/05/2020   Obesity, Class III, BMI 40-49.9 (morbid obesity) (HCC) 06/05/2020   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) 05/02/2020   Atherosclerosis of aorta (HCC) 05/02/2020   Goals of care, counseling/discussion 04/08/2017   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 (HCC) 04/08/2017   Liver cirrhosis (HCC) 02/08/2017   SBP (spontaneous bacterial peritonitis) (HCC) 02/07/2017   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   DOE (dyspnea on exertion) 11/15/2016   Obesity, morbid (more than 100 lbs over ideal weight or BMI > 40) (HCC) 11/15/2016   Elevated PSA 05/30/2015   BPH with obstruction/lower urinary tract symptoms 05/30/2015   Type 2 diabetes mellitus (HCC) 05/18/2014   Arthritis 01/03/2014   Hyperlipidemia associated with type 2 diabetes mellitus (HCC) 01/03/2014   Benign essential hypertension 01/03/2014   Gout 01/03/2014   Psoriasis 01/03/2014    Reflux 01/03/2014   PCP:  Nona Dell, NP Pharmacy:   [facility] - [place], [place] - [address] [place] [address] Phone: [phone] Fax: [phone]     Social Drivers of Health (SDOH) Social History: SDOH Screenings   Food Insecurity: No Food Insecurity (11/14/2023)  Housing: Low Risk  (11/14/2023)  Transportation Needs: No Transportation Needs (11/14/2023)  Utilities: Not At Risk (11/14/2023)  Alcohol Screen: Low Risk  (10/17/2021)  Depression (PHQ2-9): Low Risk  (10/17/2021)  Financial Resource Strain: Low Risk  (10/17/2021)  Physical Activity: Insufficiently Active (10/17/2021)  Social Connections: Socially Isolated (11/14/2023)  Stress: No Stress Concern Present (10/17/2021)  Tobacco Use: Medium Risk (11/14/2023)   SDOH Interventions:     Readmission Risk Interventions     No data to display

## 2023-11-16 NOTE — Care Management Obs Status (Signed)
MEDICARE OBSERVATION STATUS NOTIFICATION   Patient Details  Name: Peter Ryan MRN: [id number] Date of Birth: Apr 18, 1951   Medicare Observation Status Notification Given:  No (Refused to sign)    [name], RN 11/16/2023, 10:24 AM

## 2023-11-16 NOTE — Consult Note (Signed)
WOC Nurse Consult Note: Reason for Consult: Stage 2 pressure injury Wound type: same Pressure Injury POA: Yes Measurement: 0.5cm x 0.5cm  Wound bed:see nursing flow sheets Drainage (amount, consistency, odor) none Periwound:intact  Dressing procedure/placement/frequency: Utilize nursing skin care order set to manage Stage 2 pressure injury; silicone foam Turn and reposition per hospital policy   Re consult if needed, will not follow at this time. Thanks  Tiffay Pinette [employer], RN,CWOCN, CNS, CWON-AP [phone])     .Marland Kitchen

## 2023-11-16 NOTE — Plan of Care (Signed)

## 2023-11-16 NOTE — Progress Notes (Signed)
PROGRESS NOTE    Peter Ryan  VHQ:[id number] DOB: 07/14/1951 DOA: 11/13/2023 PCP: Nona Dell, NP  Chief Complaint  Patient presents with   Aphasia    Hospital Course:  Peter Ryan is 73 y.o. male with history of CVA with right-sided hemiparesis, paroxysmal A-fib, heart failure with reduced EF, hypertension, diabetes, gout, BPH, history of non-Hodgkin's lymphoma, who presented to the ED with several week increase slurred speech.  Reportedly patient believes that symptoms have been gradual and he is near baseline however his caregiver is concerned that it is more acute than that - He was admitted to the hospital due to concern of acute CVA.  Brain MRI reveals no evidence of acute or subacute infarct.  It did note parenchymal atrophy, advanced chronic small vessel ischemic disease, and chronic infarcts.  There is a chronic infarct in the left ventral aspect of the medulla which is new compared to MRI from 2022.  Is also some signal abnormality within the left vertebral artery at the V3/V4 junction, which may reflect high-grade stenosis or vessel occlusion.  CTA head and neck revealed occluded left V3 vertebral artery which could be atherosclerosis or dissection.  They had is indeterminate but it does appear new since 2022.  60% proximal stenosis seen on right V2 vertebral artery. He was evaluated by neurology.  He has a CHA2DS2-VASc of 5 and overall burden of microhemorrhages is relatively low.  He was recommended to continue Eliquis since the risk of recurrent thromboembolic event if anticoagulation is held as felt to be greater than the risk of intracranial hemorrhage.  Patient has been seen by PT and OT and they recommended skilled nursing facility placement.  Subjective: Increasing tachypnea and hypertension overnight. The patient has no acute complaints. His wife is at bedside, she remains concerned about her ability to care for him at home.   Objective: Vitals:   11/16/23  0108 11/16/23 0516 11/16/23 0924 11/16/23 0944  BP: (!) 162/80 (!) 160/84  (!) 169/85  Pulse: 83 82  89  Resp: 18 18 20 18   Temp: 98.7 F (37.1 C) (!) 97.4 F (36.3 C)  97.7 F (36.5 C)  TempSrc:      SpO2: 95% 96%  94%  Weight:      Height:       No intake or output data in the 24 hours ending 11/16/23 0952 Filed Weights   11/13/23 1222  Weight: 103 kg    Examination: General exam: Appears calm and comfortable, NAD  Respiratory system: shallow respirations, intermittent tachypnea  Cardiovascular system: S1 & S2 heard, RRR.  Gastrointestinal system: Abdomen is nondistended, soft and nontender.  Neuro: Alert and oriented. No focal neurological deficits. Extremities: edema in lower extremities bilaterally Psychiatry: Demonstrates appropriate judgement and insight. Mood & affect appropriate for situation.   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  Patient is becoming increasingly tachypneic and hypertensive today, currently 24RR  - Has been on Lasix p.o. with minimal output.  Will transition to IV Lasix now given lower extremity edema. -- Cr stable. -- Monitor Strict I's and O's, taper back to PO lasix when output slows - Continue GDMT as BP tolerates.  History of CVA with baseline slurred speech, acute worsening. - Has baseline right-sided hemiparesis and slurred speech. - At baseline mostly bedbound, able to feed himself but requires 2 person assist for  transfers - No acute findings on MRI, areas of high-grade stenosis, multiple chronic microhemorrhages -- Possible TIA to explain acute worsening, nearing baseline now. - Continue statin and apixaban - CTA with V3 occlusion, unclear chronicity.  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   DVT prophylaxis: Eliquis   Code Status: Limited: Do not attempt resuscitation (DNR) -DNR-LIMITED -Do Not Intubate/DNI  Family Communication:  Discussed directly with patient and his wife at bedside Disposition:  Currently Obs but will make inpatient now as he necessitates IV Lasix . still hospitalized for diuresis, will discharge to SNF when arranged by [facility]  Consultants:    Procedures:    Antimicrobials:  Anti-infectives (From admission, onward)    None       Data Reviewed: I have personally reviewed following labs and imaging studies CBC: Recent Labs  Lab 11/13/23 1225  WBC 9.1  NEUTROABS 6.4  HGB 15.8  HCT 47.9  MCV 87.1  PLT 262   Basic Metabolic Panel: Recent Labs  Lab 11/13/23 1225  NA 139  K 3.7  CL 101  CO2 25  GLUCOSE 253*  BUN 10  CREATININE 0.69  CALCIUM 9.0   GFR: Estimated Creatinine Clearance: 103.3 mL/min (by C-G formula based on SCr of 0.69 mg/dL). Liver Function Tests: Recent Labs  Lab 11/13/23 1225  AST  27  ALT 15  ALKPHOS 63  BILITOT 0.5  PROT 8.1  ALBUMIN 3.8   CBG: Recent Labs  Lab 11/15/23 1213 11/15/23 1650 11/15/23 2151 11/16/23 0517 11/16/23 0916  GLUCAP 192* 227* 219* 176* 196*    No results found for this or any previous visit (from the past 240 hours).   Radiology Studies: ECHOCARDIOGRAM COMPLETE BUBBLE STUDY Result Date: 11/15/2023    ECHOCARDIOGRAM REPORT   Patient Name:   Demetry Addison Date of Exam: 11/15/2023 Medical Rec #:  [id number]       Height:       70.0 in Accession #:    [id number]      Weight:       227.1 lb Date of Birth:  Jun 04, 1951       BSA:          2.203 m Patient Age:    72 years        BP:           159/89 mmHg Patient Gender: M               HR:           86 bpm. Exam Location:  [facility] Procedure:  2D Echo, Saline Contrast Bubble Study and Intracardiac Opacification            Agent (Both Spectral and Color Flow Doppler were utilized during            procedure). Indications:     Stroke I63.9  History:         Patient has prior history of Echocardiogram examinations, most                  recent 12/18/2022.  Sonographer:     Overton Mam RDCS, FASE Referring Phys:  [id number] Andris Baumann Diagnosing Phys: Julien Nordmann MD  Sonographer Comments: Technically challenging study due to limited acoustic windows, suboptimal apical window, suboptimal parasternal window and patient is obese. Image acquisition challenging due to patient body habitus. Acoustically limited study due to both poor sound transmission and body habitus. Saline Microcavitation (Bubble Study) attempted twice, both of suboptimal quality. IMPRESSIONS  1. Left ventricular ejection fraction, by estimation, is 25 to 30%. The left ventricle has severely decreased function. The left ventricle demonstrates global hypokinesis. There is mild left ventricular hypertrophy. Left ventricular diastolic parameters  are consistent with Grade I diastolic dysfunction (impaired relaxation).  2. Right ventricular  systolic function is normal. The right ventricular size is normal.  3. The mitral valve is normal in structure. No evidence of mitral valve regurgitation. No evidence of mitral stenosis.  4. The aortic valve is normal in structure. There is mild calcification of the aortic valve. Aortic valve regurgitation is not visualized. Aortic valve sclerosis/calcification is present, without any evidence of aortic stenosis.  5. The inferior vena cava is normal in size with greater than 50% respiratory variability, suggesting right atrial pressure of 3 mmHg. FINDINGS  Left Ventricle: Left ventricular ejection fraction, by estimation, is 25 to 30%. The left ventricle has severely decreased function. The left ventricle demonstrates global hypokinesis. Definity contrast agent was given IV to delineate the left ventricular endocardial borders. Strain imaging was not performed. The left ventricular internal cavity size was normal in size. There is mild left ventricular hypertrophy. Left ventricular diastolic parameters are consistent with Grade I diastolic dysfunction (impaired relaxation). Right Ventricle: The right ventricular size is normal. No increase in right ventricular wall thickness. Right ventricular systolic function is normal. Left Atrium: Left atrial size was normal in size. Right Atrium: Right atrial size was normal in size. Pericardium: There is no evidence of pericardial effusion. Mitral Valve: The mitral valve is normal in structure. No evidence of mitral valve regurgitation. No evidence of mitral valve stenosis. Tricuspid Valve: The tricuspid valve is normal in structure. Tricuspid valve regurgitation is not demonstrated. No evidence of tricuspid stenosis. Aortic Valve: The aortic valve is normal in structure. There is mild calcification of the aortic valve. Aortic valve regurgitation is not visualized. Aortic valve sclerosis/calcification is present, without any evidence of aortic stenosis. Aortic valve peak  gradient measures 6.4 mmHg. Pulmonic Valve: The pulmonic valve was normal in structure. Pulmonic valve regurgitation is not visualized. No evidence of pulmonic stenosis. Aorta: The aortic root is normal in size and structure. Venous: The inferior vena cava is normal in size with greater than 50% respiratory variability, suggesting right atrial pressure of 3 mmHg. IAS/Shunts: No atrial level shunt detected by color flow Doppler. Agitated saline contrast was given intravenously to evaluate for intracardiac shunting, though not well visualized. . Additional Comments: 3D imaging was not performed.  LEFT VENTRICLE PLAX 2D LVIDd:  5.35 cm      Diastology LVIDs:         4.70 cm      LV e' medial:    5.11 cm/s LV PW:         1.15 cm      LV E/e' medial:  12.7 LV IVS:        1.25 cm      LV e' lateral:   5.66 cm/s LVOT diam:     2.40 cm      LV E/e' lateral: 11.5 LV SV:         58 LV SV Index:   26 LVOT Area:     4.52 cm  LV Volumes (MOD) LV vol d, MOD A4C: 148.0 ml LV vol s, MOD A4C: 103.0 ml LV SV MOD A4C:     148.0 ml RIGHT VENTRICLE RV Basal diam:  3.40 cm LEFT ATRIUM           Index        RIGHT ATRIUM           Index LA diam:      4.00 cm 1.82 cm/m   RA Area:     23.70 cm LA Vol (A2C): 12.1 ml 5.49 ml/m   RA Volume:   61.60 ml  27.96 ml/m LA Vol (A4C): 47.4 ml 21.52 ml/m  AORTIC VALVE AV Area (Vmax): 2.54 cm AV Vmax:        126.95 cm/s AV Peak Grad:   6.4 mmHg LVOT Vmax:      71.20 cm/s LVOT Vmean:     43.500 cm/s LVOT VTI:       0.129 m  AORTA Ao Root diam: 3.90 cm Ao Asc diam:  3.60 cm MITRAL VALVE MV Area (PHT): 6.12 cm    SHUNTS MV Decel Time: 124 msec    Systemic VTI:  0.13 m MV E velocity: 65.00 cm/s  Systemic Diam: 2.40 cm MV A velocity: 84.90 cm/s MV E/A ratio:  0.77 Julien Nordmann MD Electronically signed by Julien Nordmann MD Signature Date/Time: 11/15/2023/3:09:26 PM    Final     Scheduled Meds:  apixaban  5 mg Oral BID   atorvastatin  80 mg Oral Daily   carvedilol  6.25 mg Oral BID WC    finasteride  5 mg Oral Daily   furosemide  40 mg Oral Daily   insulin aspart  0-15 Units Subcutaneous TID WC   insulin aspart  0-5 Units Subcutaneous QHS   irbesartan  75 mg Oral Daily   spironolactone  25 mg Oral Daily   Continuous Infusions:   LOS: 0 days    Total time spent coordinating care:   Debarah Crape, DO [facility]  To contact the attending physician between 7A-7P please use Epic Chat. To contact the covering physician during after hours 7P-7A, please review Amion.   11/16/2023, 9:52 AM   *This document has been created with the assistance of dictation software. Please excuse typographical errors. *

## 2023-11-17 ENCOUNTER — Encounter: Payer: Self-pay | Admitting: Family Medicine

## 2023-11-17 DIAGNOSIS — I5023 Acute on chronic systolic (congestive) heart failure: Secondary | ICD-10-CM

## 2023-11-17 LAB — GLUCOSE, CAPILLARY
Glucose-Capillary: 169 mg/dL — ABNORMAL HIGH (ref 70–99)
Glucose-Capillary: 207 mg/dL — ABNORMAL HIGH (ref 70–99)
Glucose-Capillary: 246 mg/dL — ABNORMAL HIGH (ref 70–99)
Glucose-Capillary: 212 mg/dL — ABNORMAL HIGH (ref 70–99)

## 2023-11-17 LAB — BASIC METABOLIC PANEL
Anion gap: 12 (ref 5–15)
BUN: 17 mg/dL (ref 8–23)
CO2: 26 mmol/L (ref 22–32)
Calcium: 8.7 mg/dL — ABNORMAL LOW (ref 8.9–10.3)
Chloride: 103 mmol/L (ref 98–111)
Creatinine, Ser: 0.83 mg/dL (ref 0.61–1.24)
GFR, Estimated: >60 mL/min (ref >60)
Glucose, Bld: 210 mg/dL — ABNORMAL HIGH (ref 70–99)
Potassium: 2.9 mmol/L — ABNORMAL LOW (ref 3.5–5.1)
Sodium: 141 mmol/L (ref 135–145)

## 2023-11-17 LAB — CBC
HCT: 43.5 % (ref 39.0–52.0)
Hemoglobin: 14.5 g/dL (ref 13.0–17.0)
MCH: 28.5 pg (ref 26.0–34.0)
MCHC: 33.3 g/dL (ref 30.0–36.0)
MCV: 85.6 fL (ref 80.0–100.0)
Platelets: 226 10*3/uL (ref 150–400)
RBC: 5.08 MIL/uL (ref 4.22–5.81)
RDW: 15 % (ref 11.5–15.5)
WBC: 9.9 10*3/uL (ref 4.0–10.5)
nRBC: 0 % (ref 0.0–0.2)

## 2023-11-17 LAB — MAGNESIUM: Magnesium: 1.7 mg/dL (ref 1.7–2.4)

## 2023-11-17 MED ORDER — HYDROXYZINE HCL 10 MG PO TABS
10.0000 mg | ORAL_TABLET | Freq: Three times a day (TID) | ORAL | Status: DC | PRN
  Administered 2023-11-19: 10 mg via ORAL
  Filled 2023-11-17 (×2): qty 1

## 2023-11-17 MED ORDER — POTASSIUM CHLORIDE CRYS ER 20 MEQ PO TBCR
40.0000 meq | EXTENDED_RELEASE_TABLET | Freq: Once | ORAL | Status: AC
  Administered 2023-11-17: 40 meq via ORAL
  Filled 2023-11-17: qty 2

## 2023-11-17 NOTE — Progress Notes (Signed)
Progress Note   Patient: Peter Ryan ZOX:[id number] DOB: [birth year] DOA: 11/13/2023     1 DOS: the patient was seen and examined on 11/17/2023   Brief hospital course:  Eder Macek is 73 y.o. male with history of CVA with right-sided hemiparesis, paroxysmal A-fib, heart failure with reduced EF, hypertension, diabetes, gout, BPH, history of non-Hodgkin's lymphoma, who presented to the ED with several week increase slurred speech.  Reportedly patient believes that symptoms have been gradual and he is near baseline however his caregiver is concerned that it is more acute than that - He was admitted to the hospital due to concern of acute CVA.  Brain MRI reveals no evidence of acute or subacute infarct.  It did note parenchymal atrophy, advanced chronic small vessel ischemic disease, and chronic infarcts.  There is a chronic infarct in the left ventral aspect of the medulla which is new compared to MRI from 2022.  Is also some signal abnormality within the left vertebral artery at the V3/V4 junction, which may reflect high-grade stenosis or vessel occlusion.  CTA head and neck revealed occluded left V3 vertebral artery which could be atherosclerosis or dissection.  They had is indeterminate but it does appear new since 2022.  60% proximal stenosis seen on right V2 vertebral artery. He was evaluated by neurology.  He has a CHA2DS2-VASc of 5 and overall burden of microhemorrhages is relatively low.  He was recommended to continue Eliquis since the risk of recurrent thromboembolic event if anticoagulation is held as felt to be greater than the risk of intracranial hemorrhage.  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  Patient noted to be increasingly tachypneic and hypertensive today, currently 24RR  - Started on IV Lasix twice daily.  Monitor renal function closely --Continue carvedilol, Avapro and spironolactone -- Last 2D echocardiogram shows an LVEF of 25 to 30% with decreased LV systolic function. - Continue GDMT as BP tolerates.   History of CVA with baseline slurred speech, acute worsening. - Has baseline right-sided hemiparesis and slurred speech. - At baseline mostly bedbound, able to feed himself but requires 2 person assist for transfers - No acute findings on MRI, areas of high-grade stenosis, multiple chronic microhemorrhages -- Possible TIA to explain acute worsening, nearing baseline now. - Continue statin and apixaban - CTA with V3 occlusion, unclear chronicity.  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   Hypokalemia Secondary to diuretic use Supplement potassium Check magnesium levels        Subjective: Complains of pruritus in his lower  back.  Physical Exam: Vitals:   11/17/23 0003 11/17/23 0404 11/17/23 0851 11/17/23 1230  BP: (!) 157/98 (!) 162/91 (!) 159/85 (!) 145/83  Pulse: 88 86 88 87  Resp: 18 18 19 19   Temp: 97.8 F (36.6 C) 97.7 F (36.5 C) 97.6 F (36.4 C) 98.7 F (37.1 C)  TempSrc: Oral Oral Oral   SpO2: 97% 94% 96% 97%  Weight:      Height:       General exam: Appears calm and comfortable, NAD  Respiratory system: shallow respirations, intermittent tachypnea  Cardiovascular system: S1 & S2 heard, RRR.  Gastrointestinal system: Abdomen is nondistended, soft and nontender.  Neuro: Alert and oriented. No focal neurological deficits. Extremities: edema in lower extremities bilaterally Psychiatry: Demonstrates appropriate judgement and insight. Mood & affect appropriate for situation.     Data Reviewed: Labs reviewed.  Potassium 2.9 There are no new results to review at this time.  Family Communication: Plan of care discussed with patient and his wife at the bedside.  They verbalized understanding and agree with the plan  Disposition: Status is: Inpatient Remains inpatient appropriate because: Awaiting discharge to SNF  Planned Discharge Destination: Skilled nursing facility    Time spent: 34 minutes  Author: Lucile Shutters, MD 11/17/2023 1:44 PM  For on call review [email or web address].

## 2023-11-17 NOTE — [place] FL2 (Signed)
Why MEDICAID FL2 LEVEL OF CARE FORM     IDENTIFICATION  Patient Name: Peter Ryan Birthdate: 1950-12-22 Sex: male Admission Date (Current Location): 11/13/2023  [facility] and [place] Number:  Chiropodist and Address:  [facility], [address] [place], [place] [address]      Provider Number: [id number]  Attending Physician Name and Address:  Lucile Shutters, MD  Relative Name and Phone Number:  Keni, Elison (Spouse)  [phone]    Current Level of Care: Hospital Recommended Level of Care:   Prior Approval Number:    Date Approved/Denied:   PASRR Number: [id number]  Discharge Plan: SNF    Current Diagnoses: Patient Active Problem List   Diagnosis Date Noted   Acute exacerbation of CHF (congestive heart failure) (HCC) 11/16/2023   Slurred speech 11/15/2023   Lower extremity edema 11/15/2023   Pressure injury of skin 11/15/2023   Chronic systolic CHF (congestive heart failure) (HCC) 11/14/2023   Dilated cardiomyopathy (HCC) 12/22/2022   Elevated brain natriuretic peptide (BNP) level 12/21/2022   Hypervolemia 12/21/2022   PAF (paroxysmal atrial fibrillation) (HCC) 12/20/2022   Pleural effusion, bilateral 12/18/2022   Acute on chronic diastolic CHF (congestive heart failure) (HCC) 12/16/2022   Prolonged QT interval 12/16/2022   Symptomatic anemia 02/07/2022   Hypokalemia 02/07/2022   TIA (transient ischemic attack) 12/01/2020   Right sided weakness 11/30/2020   Hypertension associated with diabetes (HCC) 11/30/2020   History of stroke with residual deficit 11/30/2020   Left pontine cerebrovascular accident (HCC) 06/14/2020   Facial droop    Rectal bleeding    Morbid obesity (HCC)    Right hemiparesis (HCC)    Uncontrolled type 2 diabetes mellitus with hyperglycemia, without long-term current use of insulin (HCC)    Acute CVA (cerebrovascular accident) (HCC) 06/05/2020   Obesity, Class III, BMI 40-49.9 (morbid  obesity) (HCC) 06/05/2020   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) 05/02/2020   Atherosclerosis of aorta (HCC) 05/02/2020   Goals of care, counseling/discussion 04/08/2017   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 (HCC) 04/08/2017   Liver cirrhosis (HCC) 02/08/2017   SBP (spontaneous bacterial peritonitis) (HCC) 02/07/2017   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   DOE (dyspnea on exertion) 11/15/2016   Obesity, morbid (more than 100 lbs over ideal weight or BMI > 40) (HCC) 11/15/2016   Elevated PSA 05/30/2015   BPH with obstruction/lower urinary tract symptoms 05/30/2015   Type 2 diabetes mellitus (HCC) 05/18/2014   Arthritis 01/03/2014   Hyperlipidemia associated with type 2 diabetes mellitus (HCC) 01/03/2014   Benign essential hypertension 01/03/2014   Gout 01/03/2014   Psoriasis 01/03/2014   Reflux 01/03/2014    Orientation RESPIRATION BLADDER Height & Weight     Self, Time, Situation, Place  Normal Incontinent Weight: 227 lb 1.2 oz (103 kg) Height:  5\' 10"  (177.8 cm)  BEHAVIORAL SYMPTOMS/MOOD NEUROLOGICAL BOWEL NUTRITION STATUS      Incontinent    AMBULATORY STATUS COMMUNICATION OF NEEDS Skin   Extensive Assist Verbally  (PI stage 2 buttocks)                       Personal Care Assistance Level of Assistance  Bathing, Feeding, Dressing Bathing Assistance: Maximum assistance Feeding assistance: Maximum assistance Dressing Assistance: Maximum assistance     Functional Limitations Info  Sight, Hearing, Speech Sight Info: Impaired Hearing Info: Adequate Speech Info: Adequate    SPECIAL CARE FACTORS FREQUENCY  PT (By  licensed PT), OT (By licensed OT)     PT Frequency: 5 times a week OT Frequency: 5 times a week            Contractures Contractures Info: Not present    Additional Factors Info  Code Status, Allergies Code Status Info: DNR- Limited Allergies Info: Hctz  (Hydrochlorothiazide), Metformin           Current Medications (11/17/2023):  This is the current hospital active medication list Current Facility-Administered Medications  Medication Dose Route Frequency Provider Last Rate Last Admin   acetaminophen (TYLENOL) tablet 650 mg  650 mg Oral Q4H PRN Andris Baumann, MD       Or   acetaminophen (TYLENOL) 160 MG/5ML solution 650 mg  650 mg Per Tube Q4H PRN Andris Baumann, MD       Or   acetaminophen (TYLENOL) suppository 650 mg  650 mg Rectal Q4H PRN Andris Baumann, MD       apixaban Everlene Balls) tablet 5 mg  5 mg Oral BID Lindajo Royal V, MD   5 mg at 11/17/23 0815   atorvastatin (LIPITOR) tablet 80 mg  80 mg Oral Daily Lindajo Royal V, MD   80 mg at 11/17/23 0814   carvedilol (COREG) tablet 6.25 mg  6.25 mg Oral BID WC Lindajo Royal V, MD   6.25 mg at 11/17/23 1610   finasteride (PROSCAR) tablet 5 mg  5 mg Oral Daily Lindajo Royal V, MD   5 mg at 11/17/23 9604   furosemide (LASIX) injection 40 mg  40 mg Intravenous BID Dezii, Alexandra, DO   40 mg at 11/16/23 1712   hydrALAZINE (APRESOLINE) injection 5 mg  5 mg Intravenous Q6H PRN Agbata, Tochukwu, MD       insulin aspart (novoLOG) injection 0-15 Units  0-15 Units Subcutaneous TID WC Andris Baumann, MD   3 Units at 11/17/23 5409   insulin aspart (novoLOG) injection 0-5 Units  0-5 Units Subcutaneous QHS Andris Baumann, MD   2 Units at 11/15/23 2202   irbesartan (AVAPRO) tablet 75 mg  75 mg Oral Daily Arnetha Courser, MD   75 mg at 11/17/23 0815   spironolactone (ALDACTONE) tablet 25 mg  25 mg Oral Daily Andris Baumann, MD   25 mg at 11/17/23 0815   Facility-Administered Medications Ordered in Other Encounters  Medication Dose Route Frequency Provider Last Rate Last Admin   sodium chloride flush (NS) 0.9 % injection 10 mL  10 mL Intravenous PRN Creig Hines, MD   10 mL at 05/12/18 1421     Discharge Medications: Please see discharge summary for a list of discharge medications.  Relevant  Imaging Results:  Relevant Lab Results:   Additional Information [id number]  Allena Katz, LCSW

## 2023-11-17 NOTE — Progress Notes (Signed)
Heart Failure Nurse Navigator Progress Note  PCP: Nona Dell, NP PCP-Cardiologist: Antonieta Iba, MD Admission Diagnosis: Slurred speech Lower extremity edema Admitted from: ED via [facility]  Presentation:   Peter Ryan presented with aphasia and lower extremity swelling. BNP 480.8.   Hx: Paroxysmal A-fib, heart failure with reduced EF & Hypertension.  Patient has had past [facility] but did not show for appointment. Update SDOH.  Patient's wife said he won't go to a follow-up appointment because he is mostly bed bound.  HF teaching completed with patient and his wife.   ECHO/ LVEF: 25-30%  Clinical Course:  Past Medical History:  Diagnosis Date   Arthritis    BPH (benign prostatic hyperplasia)    Cancer (HCC)    Non-Hodgkins lymphoma   Chronic prostatitis    Cirrhosis of liver (HCC)    Diabetes mellitus without complication (HCC)    Dyslipidemia    Elevated PSA    Gastric reflux    Gout    HTN (hypertension)    Hyperlipidemia    Neuropathy    Over weight    Psoriasis    Testicular pain, right    Urinary frequency      Social History   Socioeconomic History   Marital status: Married    Spouse name: [name]   Number of children: 0   Years of education: Not on file   Highest education level: Some college, no degree  Occupational History   Occupation: retired  Tobacco Use   Smoking status: Former    Current packs/day: 0.00    Average packs/day: 1.5 packs/day for 8.0 years (12.0 ttl pk-yrs)    Types: Cigarettes    Start date: 05/29/1966    Quit date: 05/29/1974    Years since quitting: 49.5   Smokeless tobacco: Never   Tobacco comments:    quit 40 years ago  Vaping Use   Vaping status: Never Used  Substance and Sexual Activity   Alcohol use: No    Alcohol/week: 0.0 standard drinks of alcohol    Comment: rarely   Drug use: No   Sexual activity: Not on file  Other Topics Concern   Not on file  Social  History Narrative   Lives with wife   Right handed   Drinks 1-2 cups coffee   Social Drivers of Health   Financial Resource Strain: Medium Risk (11/17/2023)   Overall Financial Resource Strain (CARDIA)    Difficulty of Paying Living Expenses: Somewhat hard  Food Insecurity: No Food Insecurity (11/14/2023)   Hunger Vital Sign    Worried About Running Out of Food in the Last Year: Never true    Ran Out of Food in the Last Year: Never true  Transportation Needs: No Transportation Needs (11/17/2023)   PRAPARE - Administrator, Civil Service (Medical): No    Lack of Transportation (Non-Medical): No  Physical Activity: Unknown (11/17/2023)   Exercise Vital Sign    Days of Exercise per Week: 1 day    Minutes of Exercise per Session: Not on file  Stress: No Stress Concern Present (10/17/2021)   [employer] of Occupational Health - Occupational Stress Questionnaire    Feeling of Stress : Not at all  Social Connections: Socially Isolated (11/14/2023)   Social Connection and Isolation Panel [NHANES]    Frequency of Communication with Friends and Family: Never    Frequency of Social Gatherings with Friends and Family: Once a week    Attends  Religious Services: Never    Active Member of Clubs or Organizations: No    Attends Banker Meetings: Never    Marital Status: Married   Water engineer and Provision:  Detailed education and instructions provided on heart failure disease management including the following:  Signs and symptoms of Heart Failure When to call the physician Importance of daily weights Low sodium diet Fluid restriction Medication management  Patient education given on each of the above topics.  Patient acknowledges understanding via teach back method and acceptance of all instructions.  Education Materials:  "Living Better With Heart Failure" Booklet, HF zone tool, & Daily Weight Tracker Tool.  Patient has scale at home: No- pt is not  able to weigh daily Patient has pill box at home: Yes-wife prepares medications for patient    High Risk Criteria for Readmission and/or Poor Patient Outcomes: Heart failure hospital admissions (last 6 months): 0  No Show rate: 2 Difficult social situation: Hx:of CVA with Right-sided hemiparesis and increased slurred speech. Demonstrates medication adherence: Yes Primary Language: English Literacy level: Reading & Comprehension  Barriers of Care:   Daily weights-unable to get at home because he is mostly bed bound.  Diet & Fluid Restrictions Medication Compliance  Considerations/Referrals:   Referral made to Heart Failure Pharmacist Stewardship: No Referral made to Heart Failure CSW/NCM TOC: No Referral made to Heart & Vascular TOC clinic: No.  Patients wife [name]) said he is unable to go to outside appointments not because of transportation but because mostly bed bound.  Patient going to SNF after discharge from this hospitalization.

## 2023-11-17 NOTE — Plan of Care (Signed)
Problem: Education: Goal: Ability to describe self-care measures that may prevent or decrease complications (Diabetes Survival Skills Education) will improve Outcome: Progressing Goal: Individualized Educational Video(s) Outcome: Progressing   Problem: Coping: Goal: Ability to adjust to condition or change in health will improve Outcome: Progressing   Problem: Fluid Volume: Goal: Ability to maintain a balanced intake and output will improve Outcome: Progressing   Problem: Health Behavior/Discharge Planning: Goal: Ability to identify and utilize available resources and services will improve Outcome: Progressing Goal: Ability to manage health-related needs will improve Outcome: Progressing   Problem: Metabolic: Goal: Ability to maintain appropriate glucose levels will improve Outcome: Progressing   Problem: Nutritional: Goal: Maintenance of adequate nutrition will improve Outcome: Progressing Goal: Progress toward achieving an optimal weight will improve Outcome: Progressing   Problem: Skin Integrity: Goal: Risk for impaired skin integrity will decrease Outcome: Progressing   Problem: Tissue Perfusion: Goal: Adequacy of tissue perfusion will improve Outcome: Progressing   Problem: Education: Goal: Knowledge of disease or condition will improve Outcome: Progressing Goal: Knowledge of secondary prevention will improve (MUST DOCUMENT ALL) Outcome: Progressing Goal: Knowledge of patient specific risk factors will improve (DELETE if not current risk factor) Outcome: Progressing   Problem: Ischemic Stroke/TIA Tissue Perfusion: Goal: Complications of ischemic stroke/TIA will be minimized Outcome: Progressing   Problem: Coping: Goal: Will verbalize positive feelings about self Outcome: Progressing Goal: Will identify appropriate support needs Outcome: Progressing   Problem: Health Behavior/Discharge Planning: Goal: Ability to manage health-related needs will  improve Outcome: Progressing Goal: Goals will be collaboratively established with patient/family Outcome: Progressing   Problem: Self-Care: Goal: Ability to participate in self-care as condition permits will improve Outcome: Progressing Goal: Verbalization of feelings and concerns over difficulty with self-care will improve Outcome: Progressing Goal: Ability to communicate needs accurately will improve Outcome: Progressing   Problem: Nutrition: Goal: Risk of aspiration will decrease Outcome: Progressing Goal: Dietary intake will improve Outcome: Progressing   Problem: Education: Goal: Knowledge of General Education information will improve Description: Including pain rating scale, medication(s)/side effects and non-pharmacologic comfort measures Outcome: Progressing   Problem: Health Behavior/Discharge Planning: Goal: Ability to manage health-related needs will improve Outcome: Progressing   Problem: Clinical Measurements: Goal: Ability to maintain clinical measurements within normal limits will improve Outcome: Progressing Goal: Will remain free from infection Outcome: Progressing Goal: Diagnostic test results will improve Outcome: Progressing Goal: Respiratory complications will improve Outcome: Progressing Goal: Cardiovascular complication will be avoided Outcome: Progressing   Problem: Activity: Goal: Risk for activity intolerance will decrease Outcome: Progressing   Problem: Nutrition: Goal: Adequate nutrition will be maintained Outcome: Progressing   Problem: Coping: Goal: Level of anxiety will decrease Outcome: Progressing   Problem: Elimination: Goal: Will not experience complications related to bowel motility Outcome: Progressing Goal: Will not experience complications related to urinary retention Outcome: Progressing   Problem: Pain Managment: Goal: General experience of comfort will improve and/or be controlled Outcome: Progressing   Problem:  Safety: Goal: Ability to remain free from injury will improve Outcome: Progressing   Problem: Education: Goal: Knowledge of secondary prevention will improve (MUST DOCUMENT ALL) Outcome: Progressing Goal: Knowledge of patient specific risk factors will improve (DELETE if not current risk factor) Outcome: Progressing   Problem: Ischemic Stroke/TIA Tissue Perfusion: Goal: Complications of ischemic stroke/TIA will be minimized Outcome: Progressing   Problem: Coping: Goal: Will identify appropriate support needs Outcome: Progressing   Problem: Health Behavior/Discharge Planning: Goal: Ability to manage health-related needs will improve Outcome: Progressing Goal: Goals will be collaboratively established  with patient/family Outcome: Progressing   Problem: Self-Care: Goal: Ability to participate in self-care as condition permits will improve Outcome: Progressing Goal: Ability to communicate needs accurately will improve Outcome: Progressing   Problem: Nutrition: Goal: Risk of aspiration will decrease Outcome: Progressing

## 2023-11-17 NOTE — TOC Progression Note (Signed)
Transition of Care [facility]) - Progression Note    Patient Details  Name: Peter Ryan MRN: [id number] Date of Birth: 07-Mar-1951  Transition of Care [facility]) CM/SW Contact  Allena Katz, LCSW Phone Number: 11/17/2023, 9:18 AM  Clinical Narrative:   CSW spoke with wife to inform her patient was inpatient. Wife would like [facility] or [facility]. Referral sent out. Pt still needs qual stay.    Expected Discharge Plan: Home w Home Health Services Barriers to Discharge: Continued Medical Work up  Expected Discharge Plan and Services     Post Acute Care Choice: Home Health Living arrangements for the past 2 months: Single Family Home                           HH Arranged: PT, OT HH [facility]: [facility] Date HH Agency Contacted: 11/16/23 Time HH Agency Contacted: 1205 Representative spoke with at [facility] Agency: Herbert Seta (Patient already active, notified of admission and plan to restart services)   Social Determinants of Health (SDOH) Interventions SDOH Screenings   Food Insecurity: No Food Insecurity (11/14/2023)  Housing: Low Risk  (11/14/2023)  Transportation Needs: No Transportation Needs (11/14/2023)  Utilities: Not At Risk (11/14/2023)  Alcohol Screen: Low Risk  (10/17/2021)  Depression (PHQ2-9): Low Risk  (10/17/2021)  Financial Resource Strain: Low Risk  (10/17/2021)  Physical Activity: Insufficiently Active (10/17/2021)  Social Connections: Socially Isolated (11/14/2023)  Stress: No Stress Concern Present (10/17/2021)  Tobacco Use: Medium Risk (11/14/2023)    Readmission Risk Interventions     No data to display

## 2023-11-18 DIAGNOSIS — G459 Transient cerebral ischemic attack, unspecified: Secondary | ICD-10-CM | POA: Diagnosis not present

## 2023-11-18 LAB — CBC
HCT: 43.7 % (ref 39.0–52.0)
Hemoglobin: 14.7 g/dL (ref 13.0–17.0)
MCH: 28.9 pg (ref 26.0–34.0)
MCHC: 33.6 g/dL (ref 30.0–36.0)
MCV: 85.9 fL (ref 80.0–100.0)
Platelets: 226 10*3/uL (ref 150–400)
RBC: 5.09 MIL/uL (ref 4.22–5.81)
RDW: 15 % (ref 11.5–15.5)
WBC: 9.8 10*3/uL (ref 4.0–10.5)
nRBC: 0 % (ref 0.0–0.2)

## 2023-11-18 LAB — BASIC METABOLIC PANEL
Anion gap: 13 (ref 5–15)
BUN: 20 mg/dL (ref 8–23)
CO2: 23 mmol/L (ref 22–32)
Calcium: 8.6 mg/dL — ABNORMAL LOW (ref 8.9–10.3)
Chloride: 104 mmol/L (ref 98–111)
Creatinine, Ser: 0.89 mg/dL (ref 0.61–1.24)
GFR, Estimated: >60 mL/min (ref >60)
Glucose, Bld: 211 mg/dL — ABNORMAL HIGH (ref 70–99)
Potassium: 3.2 mmol/L — ABNORMAL LOW (ref 3.5–5.1)
Sodium: 140 mmol/L (ref 135–145)

## 2023-11-18 LAB — GLUCOSE, CAPILLARY
Glucose-Capillary: 211 mg/dL — ABNORMAL HIGH (ref 70–99)
Glucose-Capillary: 241 mg/dL — ABNORMAL HIGH (ref 70–99)
Glucose-Capillary: 206 mg/dL — ABNORMAL HIGH (ref 70–99)
Glucose-Capillary: 251 mg/dL — ABNORMAL HIGH (ref 70–99)

## 2023-11-18 MED ORDER — POTASSIUM CHLORIDE CRYS ER 20 MEQ PO TBCR
40.0000 meq | EXTENDED_RELEASE_TABLET | Freq: Once | ORAL | Status: AC
  Administered 2023-11-18: 40 meq via ORAL
  Filled 2023-11-18: qty 2

## 2023-11-18 MED ORDER — FUROSEMIDE 40 MG PO TABS
40.0000 mg | ORAL_TABLET | Freq: Every day | ORAL | Status: DC
  Administered 2023-11-18 – 2023-11-20 (×3): 40 mg via ORAL
  Filled 2023-11-18 (×3): qty 1

## 2023-11-18 MED ORDER — INSULIN GLARGINE-YFGN 100 UNIT/ML ~~LOC~~ SOLN
10.0000 [IU] | Freq: Every day | SUBCUTANEOUS | Status: DC
  Administered 2023-11-18: 10 [IU] via SUBCUTANEOUS
  Filled 2023-11-18 (×2): qty 0.1

## 2023-11-18 NOTE — Inpatient Diabetes Management (Signed)
Inpatient Diabetes Program Recommendations  AACE/ADA: New Consensus Statement on Inpatient Glycemic Control   Target Ranges:  Prepandial:   less than 140 mg/dL      Peak postprandial:   less than 180 mg/dL (1-2 hours)      Critically ill patients:  140 - 180 mg/dL    Latest Reference Range & Units 11/17/23 07:52 11/17/23 11:23 11/17/23 16:32 11/17/23 20:16 11/18/23 08:38  Glucose-Capillary 70 - 99 mg/dL 562 (H) 130 (H) 865 (H) 212 (H) 211 (H)   Review of Glycemic Control  Diabetes history: DM2 Outpatient Diabetes medications: None Current orders for Inpatient glycemic control: Novolog 0-15 units TID with meals, Novolog 0-5 units QHS  Inpatient Diabetes Program Recommendations:    Insulin: Please consider ordering Semglee 5 units Q24H and Novolog 3 units TID with meals for meal coverage if patient eats at least 50% of meals.  Thanks, Orlando Penner, RN, MSN, CDCES Diabetes Coordinator Inpatient Diabetes Program [phone] (Team Pager from 8am to 5pm)

## 2023-11-18 NOTE — Progress Notes (Signed)
Physical Therapy Treatment Patient Details Name: Peter Ryan MRN: [id number] DOB: [birth year] Today's Date: 11/18/2023   History of Present Illness Pt admitted to [facility] on 11/13/23 for increased slurred speech per caregiver. Significant PMH includes:  stroke (residual R hemiparesis and dysarthria), liver cirrhosis, HTN, DM, dyslipidemia, chronic prostatitis, gout, BPH, bedbound but can sit in the recliner, history of non-Hodgkin's lymphoma, history of thoracentesis, chronic hypokalemia, obesity, chronic anemia.    PT Comments  Pt was in semi fowlers position upon arrival. He is alert and O but unwilling to attempt EOB or OOB activity. Session included Sports administrator HEP handout and performing PROM/AAROM.yellow theraband issued for LUE there ex. Pt reports he has mechanical lifts at home but does not use them. Acute PT will continue to follow per current POC.  Dc recommendations remain appropriate to maximize pt's independence and safety with all ADLs    If plan is discharge home, recommend the following: Two people to help with bathing/dressing/bathroom;Two people to help with walking and/or transfers;Help with stairs or ramp for entrance;Assist for transportation;Assistance with cooking/housework     Equipment Recommendations  None recommended by PT       Precautions / Restrictions Precautions Precautions: Fall Recall of Precautions/Restrictions: Intact Restrictions Weight Bearing Restrictions Per Provider Order: No     Mobility  Bed Mobility Overal bed mobility: Needs Assistance  General bed mobility comments: pt unwilling to attempt EOB or OOB sitting however required max assist of one to just reposition to [facility] while in bed.             Cognition Arousal: Alert Behavior During Therapy: WFL for tasks assessed/performed   PT - Cognitive impairments: No apparent impairments  PT - Cognition Comments: Pt is A and O but self limiting . UNwilling to use lift to get OOB. untouched  breakfast at bedside, pt unwilling to eat at this time Following commands: Intact            General Comments General comments (skin integrity, edema, etc.): Pt was issued HEP handout for LLE ther ex and LUE yellow theraband exercises to promote strengthening. Author perform PROM to RUE/RLE      Pertinent Vitals/Pain Pain Assessment Pain Assessment: 0-10 Pain Score: 6  Pain Location: R knee with mobility Pain Descriptors / Indicators: Discomfort, Guarding, Spasm Pain Intervention(s): Limited activity within patient's tolerance, Monitored during session, Premedicated before session, Repositioned     PT Goals (current goals can now be found in the care plan section) Acute Rehab PT Goals Patient Stated Goal: none stated Progress towards PT goals: Not progressing toward goals - comment    Frequency    Min 1X/week       AM-PAC PT "6 Clicks" Mobility   Outcome Measure  Help needed turning from your back to your side while in a flat bed without using bedrails?: A Lot Help needed moving from lying on your back to sitting on the side of a flat bed without using bedrails?: Total Help needed moving to and from a bed to a chair (including a wheelchair)?: Total Help needed standing up from a chair using your arms (e.g., wheelchair or bedside chair)?: Total Help needed to walk in hospital room?: Total Help needed climbing 3-5 steps with a railing? : Total 6 Click Score: 7    End of Session   Activity Tolerance: Patient tolerated treatment well Patient left: in bed;with call bell/phone within reach;with bed alarm set Nurse Communication: Mobility status PT Visit Diagnosis: Other abnormalities of gait and  mobility (R26.89);Muscle weakness (generalized) (M62.81)     Time: 6384-5364 PT Time Calculation (min) (ACUTE ONLY): 16 min  Charges:    $Therapeutic Exercise: 8-22 mins PT General Charges $$ ACUTE PT VISIT: 1 Visit                     Jetta Lout PTA 11/18/23,  12:34 PM

## 2023-11-18 NOTE — TOC Progression Note (Signed)
Transition of Care [facility]) - Progression Note    Patient Details  Name: Peter Ryan MRN: [id number] Date of Birth: 08-08-1951  Transition of Care [facility]) CM/SW Contact  Garret Reddish, RN Phone Number: 11/18/2023, 10:10 AM  Clinical Narrative:     Chart reviewed. Noted that patient was admitted with TIA. Noted that patient was made inpatient on 11/16/23.  Medicare requires a 3 midnight stay to qualify for SNF placement.  Patient will be able to dc to SNF on 11/20/23 if stable for discharge.  Patient/family has accepted bed offer at [facility].    TOC will continue to follow fo discharge planning.    Expected Discharge Plan: Home w Home Health Services Barriers to Discharge: Continued Medical Work up  Expected Discharge Plan and Services     Post Acute Care Choice: Home Health Living arrangements for the past 2 months: Single Family Home                           HH Arranged: PT, OT [facility] Date HH Agency Contacted: 11/16/23 Time HH Agency Contacted: 1205 Representative spoke with at [facility] Agency: Herbert Seta (Patient already active, notified of admission and plan to restart services)   Social Determinants of Health (SDOH) Interventions SDOH Screenings   Food Insecurity: No Food Insecurity (11/14/2023)  Housing: Low Risk  (11/17/2023)  Transportation Needs: No Transportation Needs (11/17/2023)  Utilities: Not At Risk (11/14/2023)  Alcohol Screen: Low Risk  (10/17/2021)  Depression (PHQ2-9): Low Risk  (10/17/2021)  Financial Resource Strain: Medium Risk (11/17/2023)  Physical Activity: Unknown (11/17/2023)  Social Connections: Socially Isolated (11/14/2023)  Stress: No Stress Concern Present (10/17/2021)  Tobacco Use: Medium Risk (11/17/2023)    Readmission Risk Interventions     No data to display

## 2023-11-18 NOTE — Plan of Care (Signed)
  Problem: Education: Goal: Knowledge of disease or condition will improve Outcome: Progressing   Problem: Coping: Goal: Will identify appropriate support needs Outcome: Progressing

## 2023-11-18 NOTE — Progress Notes (Signed)
Progress Note   Patient: Peter Ryan WUJ:[id number] DOB: [birth year] DOA: 11/13/2023     2 DOS: the patient was seen and examined on 11/18/2023   Brief hospital course:  Peter Ryan is 73 y.o. male with history of CVA with right-sided hemiparesis, paroxysmal A-fib, heart failure with reduced EF, hypertension, diabetes, gout, BPH, history of non-Hodgkin's lymphoma, who presented to the ED with several week increase slurred speech.  Reportedly patient believes that symptoms have been gradual and he is near baseline however his caregiver is concerned that it is more acute than that - He was admitted to the hospital due to concern of acute CVA.  Brain MRI reveals no evidence of acute or subacute infarct.  It did note parenchymal atrophy, advanced chronic small vessel ischemic disease, and chronic infarcts.  There is a chronic infarct in the left ventral aspect of the medulla which is new compared to MRI from 2022.  Is also some signal abnormality within the left vertebral artery at the V3/V4 junction, which may reflect high-grade stenosis or vessel occlusion.  CTA head and neck revealed occluded left V3 vertebral artery which could be atherosclerosis or dissection.  They had is indeterminate but it does appear new since 2022.  60% proximal stenosis seen on right V2 vertebral artery. He was evaluated by neurology.  He has a CHA2DS2-VASc of 5 and overall burden of microhemorrhages is relatively low.  He was recommended to continue Eliquis since the risk of recurrent thromboembolic event if anticoagulation is held as felt to be greater than the risk of intracranial hemorrhage.      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 BNP elevated at 480 - Started on IV Lasix twice daily and has been transitioned to oral Lasix --Continue carvedilol, Avapro and spironolactone -- Last 2D echocardiogram showed an LVEF of 25 to 30% with decreased LV systolic function. - Continue GDMT as BP tolerates.    History of CVA with baseline slurred speech, acute worsening. - Has baseline right-sided hemiparesis and slurred speech. - At baseline mostly bedbound, able to feed himself but requires 2 person assist for transfers - No acute findings on MRI, areas of high-grade stenosis, multiple chronic microhemorrhages -- Possible TIA to explain acute worsening, nearing baseline now. - Continue statin and apixaban - CTA with V3 occlusion, unclear chronicity.  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     Hypokalemia Secondary to diuretic use Supplement potassium Check magnesium levels           Subjective: No new complaints  Physical Exam: Vitals:   11/17/23  2015 11/18/23 0015 11/18/23 0414 11/18/23 1253  BP: (!) 141/75 (!) 142/82 (!) 152/77 (!) 145/73  Pulse: 83 86 88 91  Resp: 16   16  Temp: 97.8 F (36.6 C) 98 F (36.7 C) 98.8 F (37.1 C) 98.2 F (36.8 C)  TempSrc: Oral Oral    SpO2: 96% 95% 95% 93%  Weight:      Height:       General exam: Appears calm and comfortable, NAD  Respiratory system: shallow respirations, intermittent tachypnea  Cardiovascular system: S1 & S2 heard, RRR.  Gastrointestinal system: Abdomen is nondistended, soft and nontender.  Neuro: Alert and oriented. No focal neurological deficits. Extremities: edema in lower extremities bilaterally Psychiatry: Demonstrates appropriate judgement and insight. Mood & affect appropriate for situation.     Data Reviewed: Labs reviewed.  Potassium 3.2 There are no new results to review at this time.  Family Communication: Plan of care discussed with patient in detail.  He verbalizes understanding and agrees with the plan  Disposition: Status is: Inpatient Remains inpatient appropriate because: Awaiting discharge to skilled nursing facility  Planned Discharge Destination: Skilled nursing facility    Time spent: 34  minutes  Author: Lucile Shutters, MD 11/18/2023 12:56 PM  For on call review [email or web address].

## 2023-11-18 NOTE — Progress Notes (Signed)
Occupational Therapy Treatment Patient Details Name: Peter Ryan MRN: [id number] DOB: 1951-02-24 Today's Date: 11/18/2023   History of present illness Pt admitted to [facility] on 11/13/23 for increased slurred speech per caregiver. Significant PMH includes:  stroke (residual R hemiparesis and dysarthria), liver cirrhosis, HTN, DM, dyslipidemia, chronic prostatitis, gout, BPH, bedbound but can sit in the recliner, history of non-Hodgkin's lymphoma, history of thoracentesis, chronic hypokalemia, obesity, chronic anemia.   OT comments  Peter Ryan was seen for OT treatment on this date. Upon arrival to room pt supine in bed, with NT present to support wash up after episode of bowel incontinence. OT facilitated ADL management with education and assist as described below. See ADL section for additional details regarding occupational performance. Pt continues to be functionally limited by significant weakness R>L, decreased activity tolerance, and decreased balance. Pt return verbalizes understanding of education provided t/o session. Pt is progressing toward OT goals and continues to benefit from skilled OT services to maximize return to PLOF and minimize risk of future falls, injury, caregiver burden, and readmission. Will continue to follow POC as written. Discharge recommendation remains appropriate.        If plan is discharge home, recommend the following:  Two people to help with walking and/or transfers;Two people to help with bathing/dressing/bathroom;Assistance with cooking/housework;Assist for transportation;Help with stairs or ramp for entrance   Equipment Recommendations   (defer)    Recommendations for Other Services      Precautions / Restrictions Precautions Precautions: Fall Recall of Precautions/Restrictions: Intact Restrictions Weight Bearing Restrictions Per Provider Order: No       Mobility Bed Mobility Overal bed mobility: Needs Assistance Bed Mobility: Rolling Rolling:  Total assist, +2 for physical assistance         General bed mobility comments: pt unwilling to attempt EOB or OOB sitting. Required TOTAL A for rolling R<>L during clean up after multiple BMS during session.    Transfers                   General transfer comment: deferred, anticipate +2-3 for attempts, may require hoyer lift (was increasingly difficult to use a STS lift at home)     Balance Overall balance assessment: Needs assistance                                         ADL either performed or assessed with clinical judgement   ADL Overall ADL's : Needs assistance/impaired Eating/Feeding: Set up;Modified independent Eating/Feeding Details (indicate cue type and reason): using non-dom L hand         Lower Body Bathing: Bed level;+2 for physical assistance;Total assistance Lower Body Bathing Details (indicate cue type and reason): Requires total assist for peri-care/wash up after multiple bouts of bowel incontinence. Makes limited attempts to participate in in his own care.         Toilet Transfer: Total assistance Toilet Transfer Details (indicate cue type and reason): Pt declines use of bedpan this date, stating he could not tolerate one at bed level. Discussed potential for need for rectal tube with RN given increased skin breakdown and frequent bouts of runny BMs. Toileting- Clothing Manipulation and Hygiene: +2 for physical assistance;Bed level;Total assistance         General ADL Comments: Pt significantly functionally limited by R sided weakness and L sided deconditioning. Educated on use of theraband on L sided for improved functional  independence during ADL management. Pt return demos understanding of LUE theraband exercises. Limited acceptance of education on importance of regular functional activity during hospital stay. Requires near total care for all functional tasks this date with significant +2 assist for all bed mobility.     Extremity/Trunk Assessment              Vision       Perception     Praxis     Communication     Cognition Arousal: Alert Behavior During Therapy: WFL for tasks assessed/performed Cognition: No apparent impairments             OT - Cognition Comments: Self limiting with functional activity.                 Following commands: Intact        Cueing      Exercises Other Exercises Other Exercises: OT/NT facilitated ADL management with education and assist as described above. See ADL section for detail.    Shoulder Instructions       General Comments Pt was issued HEP handout for LLE ther ex and LUE yellow theraband exercises to promote strengthening. Author perform PROM to RUE/RLE    Pertinent Vitals/ Pain       Pain Assessment Pain Assessment: Faces Faces Pain Scale: Hurts whole lot Pain Location: generalized pain with activity, flinshes/yells out with minimal movement in bed. Does not localize pain. Pain Descriptors / Indicators: Discomfort, Guarding Pain Intervention(s): Limited activity within patient's tolerance, Monitored during session, Repositioned  Home Living                                          Prior Functioning/Environment              Frequency  Min 1X/week        Progress Toward Goals  OT Goals(current goals can now be found in the care plan section)  Progress towards OT goals: Progressing toward goals  Acute Rehab OT Goals Patient Stated Goal: to feel better OT Goal Formulation: With patient Time For Goal Achievement: 11/28/23 Potential to Achieve Goals: Fair  Plan      Co-evaluation                 AM-PAC OT "6 Clicks" Daily Activity     Outcome Measure   Help from another person eating meals?: A Little Help from another person taking care of personal grooming?: A Little Help from another person toileting, which includes using toliet, bedpan, or urinal?: Total Help from another  person bathing (including washing, rinsing, drying)?: Total Help from another person to put on and taking off regular upper body clothing?: A Lot Help from another person to put on and taking off regular lower body clothing?: Total 6 Click Score: 11    End of Session    OT Visit Diagnosis: Other abnormalities of gait and mobility (R26.89);Muscle weakness (generalized) (M62.81)   Activity Tolerance Patient tolerated treatment well   Patient Left in bed;with call bell/phone within reach;with family/visitor present;with bed alarm set   Nurse Communication          Time: 1610-9604 OT Time Calculation (min): 26 min  Charges: OT General Charges $OT Visit: 1 Visit OT Treatments $Self Care/Home Management : 23-37 mins  Rockney Ghee, M.S., OTR/L 11/18/23, 3:47 PM

## 2023-11-19 DIAGNOSIS — R4781 Slurred speech: Secondary | ICD-10-CM | POA: Diagnosis not present

## 2023-11-19 DIAGNOSIS — R600 Localized edema: Secondary | ICD-10-CM | POA: Diagnosis not present

## 2023-11-19 DIAGNOSIS — I6930 Unspecified sequelae of cerebral infarction: Secondary | ICD-10-CM | POA: Diagnosis not present

## 2023-11-19 DIAGNOSIS — G459 Transient cerebral ischemic attack, unspecified: Secondary | ICD-10-CM | POA: Diagnosis not present

## 2023-11-19 LAB — GLUCOSE, CAPILLARY
Glucose-Capillary: 226 mg/dL — ABNORMAL HIGH (ref 70–99)
Glucose-Capillary: 214 mg/dL — ABNORMAL HIGH (ref 70–99)
Glucose-Capillary: 198 mg/dL — ABNORMAL HIGH (ref 70–99)
Glucose-Capillary: 154 mg/dL — ABNORMAL HIGH (ref 70–99)
Glucose-Capillary: 209 mg/dL — ABNORMAL HIGH (ref 70–99)

## 2023-11-19 MED ORDER — INSULIN GLARGINE-YFGN 100 UNIT/ML ~~LOC~~ SOLN
12.0000 [IU] | Freq: Every day | SUBCUTANEOUS | Status: DC
  Administered 2023-11-19 – 2023-11-20 (×2): 12 [IU] via SUBCUTANEOUS
  Filled 2023-11-19 (×2): qty 0.12

## 2023-11-19 MED ORDER — INSULIN ASPART 100 UNIT/ML IJ SOLN
3.0000 [IU] | Freq: Three times a day (TID) | INTRAMUSCULAR | Status: DC
  Administered 2023-11-19 – 2023-11-20 (×4): 3 [IU] via SUBCUTANEOUS
  Filled 2023-11-19 (×4): qty 1

## 2023-11-19 NOTE — Inpatient Diabetes Management (Signed)
Inpatient Diabetes Program Recommendations  AACE/ADA: New Consensus Statement on Inpatient Glycemic Control   Target Ranges:  Prepandial:   less than 140 mg/dL      Peak postprandial:   less than 180 mg/dL (1-2 hours)      Critically ill patients:  140 - 180 mg/dL    Latest Reference Range & Units 11/18/23 08:38 11/18/23 12:47 11/18/23 16:26 11/18/23 19:49 11/19/23 04:10  Glucose-Capillary 70 - 99 mg/dL 811 (H) 914 (H) 782 (H) 251 (H) 226 (H)   Review of Glycemic Control  Diabetes history: DM2 Outpatient Diabetes medications: None Current orders for Inpatient glycemic control: Semglee 10 units daily, Novolog 0-15 units TID with meals, Novolog 0-5 units QHS  Inpatient Diabetes Program Recommendations:    Insulin: Please consider increasing Semglee to 13 units daily. If patient is eating at least 50% of meals, may want to consider ordering Novolog 3 units TID with meals for meal coverage if patient eats at least 50% of meals   Thanks, Orlando Penner, RN, MSN, CDCES Diabetes Coordinator Inpatient Diabetes Program [phone] (Team Pager from 8am to 5pm)

## 2023-11-19 NOTE — Plan of Care (Signed)
  Problem: Nutritional: Goal: Maintenance of adequate nutrition will improve Outcome: Progressing   Problem: Nutrition: Goal: Risk of aspiration will decrease Outcome: Progressing   Problem: Coping: Goal: Will identify appropriate support needs Outcome: Progressing   Problem: Coping: Goal: Will verbalize positive feelings about self Outcome: Not Progressing   Problem: Health Behavior/Discharge Planning: Goal: Ability to manage health-related needs will improve Outcome: Not Progressing   Problem: Self-Care: Goal: Ability to participate in self-care as condition permits will improve Outcome: Not Progressing

## 2023-11-19 NOTE — Care Management Important Message (Signed)
Important Message  Patient Details  Name: Peter Ryan MRN: [id number] Date of Birth: 1950-11-19   Important Message Given:  Yes - Medicare IM     Cristela Blue, CMA 11/19/2023, 10:24 AM

## 2023-11-19 NOTE — Plan of Care (Signed)
Problem: Education: Goal: Ability to describe self-care measures that may prevent or decrease complications (Diabetes Survival Skills Education) will improve Outcome: Progressing Goal: Individualized Educational Video(s) Outcome: Progressing   Problem: Coping: Goal: Ability to adjust to condition or change in health will improve Outcome: Progressing   Problem: Fluid Volume: Goal: Ability to maintain a balanced intake and output will improve Outcome: Progressing   Problem: Health Behavior/Discharge Planning: Goal: Ability to identify and utilize available resources and services will improve Outcome: Progressing Goal: Ability to manage health-related needs will improve Outcome: Progressing   Problem: Metabolic: Goal: Ability to maintain appropriate glucose levels will improve Outcome: Progressing   Problem: Nutritional: Goal: Maintenance of adequate nutrition will improve Outcome: Progressing Goal: Progress toward achieving an optimal weight will improve Outcome: Progressing   Problem: Skin Integrity: Goal: Risk for impaired skin integrity will decrease Outcome: Progressing   Problem: Tissue Perfusion: Goal: Adequacy of tissue perfusion will improve Outcome: Progressing   Problem: Education: Goal: Knowledge of disease or condition will improve Outcome: Progressing Goal: Knowledge of secondary prevention will improve (MUST DOCUMENT ALL) Outcome: Progressing Goal: Knowledge of patient specific risk factors will improve (DELETE if not current risk factor) Outcome: Progressing   Problem: Ischemic Stroke/TIA Tissue Perfusion: Goal: Complications of ischemic stroke/TIA will be minimized Outcome: Progressing   Problem: Coping: Goal: Will verbalize positive feelings about self Outcome: Progressing Goal: Will identify appropriate support needs Outcome: Progressing   Problem: Health Behavior/Discharge Planning: Goal: Ability to manage health-related needs will  improve Outcome: Progressing Goal: Goals will be collaboratively established with patient/family Outcome: Progressing   Problem: Self-Care: Goal: Ability to participate in self-care as condition permits will improve Outcome: Progressing Goal: Verbalization of feelings and concerns over difficulty with self-care will improve Outcome: Progressing Goal: Ability to communicate needs accurately will improve Outcome: Progressing   Problem: Nutrition: Goal: Risk of aspiration will decrease Outcome: Progressing Goal: Dietary intake will improve Outcome: Progressing   Problem: Education: Goal: Knowledge of General Education information will improve Description: Including pain rating scale, medication(s)/side effects and non-pharmacologic comfort measures Outcome: Progressing   Problem: Health Behavior/Discharge Planning: Goal: Ability to manage health-related needs will improve Outcome: Progressing   Problem: Clinical Measurements: Goal: Ability to maintain clinical measurements within normal limits will improve Outcome: Progressing Goal: Will remain free from infection Outcome: Progressing Goal: Diagnostic test results will improve Outcome: Progressing Goal: Respiratory complications will improve Outcome: Progressing Goal: Cardiovascular complication will be avoided Outcome: Progressing   Problem: Activity: Goal: Risk for activity intolerance will decrease Outcome: Progressing   Problem: Nutrition: Goal: Adequate nutrition will be maintained Outcome: Progressing   Problem: Coping: Goal: Level of anxiety will decrease Outcome: Progressing   Problem: Elimination: Goal: Will not experience complications related to bowel motility Outcome: Progressing Goal: Will not experience complications related to urinary retention Outcome: Progressing   Problem: Education: Goal: Knowledge of secondary prevention will improve (MUST DOCUMENT ALL) Outcome: Progressing Goal: Knowledge  of patient specific risk factors will improve (DELETE if not current risk factor) Outcome: Progressing   Problem: Ischemic Stroke/TIA Tissue Perfusion: Goal: Complications of ischemic stroke/TIA will be minimized Outcome: Progressing   Problem: Coping: Goal: Will identify appropriate support needs Outcome: Progressing   Problem: Health Behavior/Discharge Planning: Goal: Ability to manage health-related needs will improve Outcome: Progressing Goal: Goals will be collaboratively established with patient/family Outcome: Progressing   Problem: Self-Care: Goal: Ability to participate in self-care as condition permits will improve Outcome: Progressing Goal: Ability to communicate needs accurately will improve Outcome: Progressing  Problem: Nutrition: Goal: Risk of aspiration will decrease Outcome: Progressing

## 2023-11-19 NOTE — Plan of Care (Signed)
  Problem: Nutritional: Goal: Maintenance of adequate nutrition will improve Outcome: Progressing   Problem: Self-Care: Goal: Verbalization of feelings and concerns over difficulty with self-care will improve Outcome: Progressing

## 2023-11-19 NOTE — Progress Notes (Signed)
Progress Note   Patient: Peter Ryan ZOX:[id number] DOB: 1951/02/01 DOA: 11/13/2023     3 DOS: the patient was seen and examined on 11/19/2023   Brief hospital course:  Peter Ryan is 73 y.o. male with history of CVA with right-sided hemiparesis, paroxysmal A-fib, heart failure with reduced EF, hypertension, diabetes, gout, BPH, history of non-Hodgkin's lymphoma, who presented to the ED with several week increase slurred speech.  Reportedly patient believes that symptoms have been gradual and he is near baseline however his caregiver is concerned that it is more acute than that - He was admitted to the hospital due to concern of acute CVA.  Brain MRI reveals no evidence of acute or subacute infarct.  It did note parenchymal atrophy, advanced chronic small vessel ischemic disease, and chronic infarcts.  There is a chronic infarct in the left ventral aspect of the medulla which is new compared to MRI from 2022.  Is also some signal abnormality within the left vertebral artery at the V3/V4 junction, which may reflect high-grade stenosis or vessel occlusion.  CTA head and neck revealed occluded left V3 vertebral artery which could be atherosclerosis or dissection.  They had is indeterminate but it does appear new since 2022.  60% proximal stenosis seen on right V2 vertebral artery. He was evaluated by neurology.  He has a CHA2DS2-VASc of 5 and overall burden of microhemorrhages is relatively low.  He was recommended to continue Eliquis since the risk of recurrent thromboembolic event if anticoagulation is held as felt to be greater than the risk of intracranial hemorrhage.    2/19: CBG elevated, increasing Semglee to 12 unit and adding 3 units with meal.  Awaiting SNF placement, [facility] likely will take him tomorrow.   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 BNP elevated at 480 - Started on IV Lasix twice daily and has been transitioned to oral Lasix --Continue carvedilol, Avapro and spironolactone -- Last 2D echocardiogram showed an LVEF of 25 to 30% with decreased LV systolic function. - Continue GDMT as BP tolerates.    History of CVA with baseline slurred speech, acute worsening. - Has baseline right-sided hemiparesis and slurred speech. - At baseline mostly bedbound, able to feed himself but requires 2 person assist for transfers - No acute findings on MRI, areas of high-grade stenosis, multiple chronic microhemorrhages -- Possible TIA to explain acute worsening, nearing baseline now. - Continue statin and apixaban - CTA with V3 occlusion, unclear chronicity.  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      Hypokalemia Secondary to diuretic use Supplement potassium Check magnesium levels        Subjective: Patient was seen and examined today.  No new concern.  Physical Exam: Vitals:   11/19/23 0015 11/19/23 0408 11/19/23 0849 11/19/23 1225  BP: (!) 142/84 (!) 152/82 (!) 151/89 139/89  Pulse: 91 90 89 87  Resp: 20 20 17  (!) 21  Temp: 98.7 F (37.1 C) 97.9 F (36.6 C)  98.4 F (36.9 C)  TempSrc:      SpO2: 96% 97% 96% 95%  Weight:      Height:       General.  Frail and obese elderly man, in no acute distress. Pulmonary.  Lungs clear bilaterally, normal respiratory effort. CV.  Regular rate and rhythm, no JVD, rub or murmur. Abdomen.  Soft, nontender, nondistended, BS positive. CNS.  Alert and oriented .  No focal neurologic deficit. Extremities.  No edema, no cyanosis, pulses intact and symmetrical.    Data Reviewed: Prior data reviewed  Family Communication:   Disposition: Status is: Inpatient Remains inpatient appropriate because: Awaiting discharge to skilled nursing facility  Planned Discharge Destination: Skilled nursing facility  DVT prophylaxis.  Eliquis Time spent: 40 minutes  This record has been created using Conservation officer, historic buildings. Errors have been sought and corrected,but may not always be located. Such creation errors do not reflect on the standard of care.   Author: Arnetha Courser, MD 11/19/2023 5:39 PM  For on call review [email or web address].

## 2023-11-20 DIAGNOSIS — R4781 Slurred speech: Secondary | ICD-10-CM | POA: Diagnosis not present

## 2023-11-20 DIAGNOSIS — G459 Transient cerebral ischemic attack, unspecified: Secondary | ICD-10-CM | POA: Diagnosis not present

## 2023-11-20 DIAGNOSIS — I509 Heart failure, unspecified: Secondary | ICD-10-CM

## 2023-11-20 DIAGNOSIS — I6930 Unspecified sequelae of cerebral infarction: Secondary | ICD-10-CM | POA: Diagnosis not present

## 2023-11-20 DIAGNOSIS — R600 Localized edema: Secondary | ICD-10-CM | POA: Diagnosis not present

## 2023-11-20 LAB — GLUCOSE, CAPILLARY
Glucose-Capillary: 184 mg/dL — ABNORMAL HIGH (ref 70–99)
Glucose-Capillary: 193 mg/dL — ABNORMAL HIGH (ref 70–99)

## 2023-11-20 MED ORDER — INSULIN GLARGINE-YFGN 100 UNIT/ML ~~LOC~~ SOLN: 15.0000 [IU] | Freq: Every day | SUBCUTANEOUS | Status: DC

## 2023-11-20 MED ORDER — INSULIN ASPART 100 UNIT/ML FLEXPEN: 5.0000 [IU] | PEN_INJECTOR | Freq: Three times a day (TID) | SUBCUTANEOUS | Status: DC

## 2023-11-20 MED ORDER — POTASSIUM CHLORIDE CRYS ER 20 MEQ PO TBCR: 20.0000 meq | EXTENDED_RELEASE_TABLET | Freq: Every day | ORAL | Status: DC

## 2023-11-20 MED ORDER — HYDROXYZINE HCL 10 MG PO TABS: 10.0000 mg | ORAL_TABLET | Freq: Three times a day (TID) | ORAL | Status: DC | PRN

## 2023-11-20 NOTE — Discharge Summary (Signed)
Physician Discharge Summary   Patient: Peter Ryan MRN: [id number] DOB: 11-Jan-1951  Admit date:     11/13/2023  Discharge date: 11/20/23  Discharge Physician: Arnetha Courser   PCP: Nona Dell, NP   Recommendations at discharge:  Please obtain CBC and BMP on follow-up Follow-up with primary care provider Follow-up with heart failure clinic Follow-up with oncology  Discharge Diagnoses: Principal Problem:   TIA (transient ischemic attack) Active Problems:   History of stroke with residual deficit   Chronic systolic CHF (congestive heart failure) (HCC)   PAF (paroxysmal atrial fibrillation) (HCC)   Uncontrolled type 2 diabetes mellitus with hyperglycemia, without long-term current use of insulin (HCC)   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  Peter Ryan is a 72 y.o. male with medical history significant for stroke with right-sided hemiparesis , paroxysmal atrial fibrillation, systolic CHF, hypertension, diabetes mellitus,  gout, BPH,  history of non-Hodgkin's lymphoma, who presented to the ED with a several week increased slurring of speech noted by his caregiver..patient states for the past year he has intermittently had slurred speech especially on awaking in the morning.  He feels like it is no different from the baseline however his caregiver got concerned that it was more slurred than usual.  Patient admitted to the hospital due to concerns for an acute stroke, MRI of the brain shows no evidence of an acute or recent subacute infarction. Parenchymal atrophy, advanced chronic small vessel ischemic disease and chronic infarcts, as described. A chronic infarct within the left ventral aspect of the medulla is new from the prior MRI of 11/30/2020. Also new from the prior  MRI, there is signal abnormality within the left vertebral artery at the V3/V4 junction, which may reflect high-grade stenosis or vessel occlusion. Consider CT or MR angiography for further evaluation. Small chronic hemorrhage within the right external capsule. Chronic microhemorrhages within the bilateral thalami, likely reflecting sequelae of chronic hypertensive microangiopathy. CT angiogram of the head and neck shows occluded left V3 vertebral artery which could be due to atherosclerosis or dissection. This finding is age indeterminate but new since 2022.  Approximately 60% stenosis of the proximal right V2 vertebral artery.  Neurology evaluated him. patient has a CHA2DS2-VASc score of 5 and overall burden of microhemorrhages is relatively low.  Continue Eliquis since the risk of recurrent thromboembolic event if anticoagulation is held is felt to be greater than the risk for intracranial hemorrhage if anticoagulation is continued. Patient was seen by OT and they recommend SNF.  2/15: Remained hemodynamically stable.  Wife at bedside side wants him to go to rehab.  2/20: Remained hemodynamically stable.  CBG is still mildly elevated.  A1c of 8.3, patient likely have uncontrolled diabetes with hyperglycemia.  Apparently was not on any antidiabetics at home.  Patient was started on Semglee and NovoLog while in the hospital and is being continued with Smitley at 15 units daily and NovoLog at 5 units with meals.  Patient need close monitoring of his blood glucose level and make adjustment by PCP as needed.  Patient also has an history of HFrEF with last echocardiogram showed an LVEF of 25 to 30% with decreased LV systolic function. Continue GDMT as BP tolerates.  And he will follow-up with heart failure clinic.  Patient has paroxysmal A-fib for  which she will continue with carvedilol and Eliquis.  History of diffuse large B-cell lymphoma of intra-abdominal lymph node for which she will continue  follow-up with outpatient oncology  Patient has some decubitus stage II ulcers for which she will continue with wound care.  Patient developed mild hypokalemia while taking diuretic and requiring supplement, he was started on daily potassium 20 mEq, patient also take spironolactone and ARB so potassium need to be monitored.  Patient will continue on current medications and need to have a close follow-up with his providers for further assistance.    Assessment and Plan: * TIA (transient ischemic attack) History of stroke with baseline slurred speech No acute findings on MRI but areas of high-grade stenosis, multiple chronic microhemorrhages Continue atorvastatin and apixaban Imaging negative for stroke, likely a TIA PT and OT are recommending SNF  Chronic systolic CHF (congestive heart failure) (HCC) Clinically compensated Continue GDMT  PAF (paroxysmal atrial fibrillation) (HCC) Continue carvedilol and apixaban  Uncontrolled type 2 diabetes mellitus with hyperglycemia, without long-term current use of insulin (HCC) Sliding scale insulin coverage  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 (HCC) No acute issues suspected       Consultants: Wound care Procedures performed: None Disposition: Skilled nursing facility Diet recommendation:  Discharge Diet Orders (From admission, onward)     Start     Ordered   11/20/23 0000  Diet - low sodium heart healthy        11/20/23 1051           Cardiac and Carb modified diet DISCHARGE MEDICATION: Allergies as of 11/20/2023       Reactions   Hctz [hydrochlorothiazide] Other (See Comments)   Gout flare   Metformin Other (See Comments)   Stomach upset        Medication List     STOP taking these medications    amiodarone 200 MG tablet Commonly known as: PACERONE   Entresto 49-51 MG Generic drug: sacubitril-valsartan   hydrocortisone 2.5 % lotion       TAKE these medications     acetaminophen 325 MG tablet Commonly known as: TYLENOL Take 2 tablets (650 mg total) by mouth every 4 (four) hours as needed for mild pain (or temp > 37.5 C (99.5 F)).   allopurinol 300 MG tablet Commonly known as: ZYLOPRIM TAKE ONE TABLET EVERY DAY   apixaban 5 MG Tabs tablet Commonly known as: ELIQUIS Take 1 tablet (5 mg total) by mouth 2 (two) times daily.   atorvastatin 80 MG tablet Commonly known as: LIPITOR TAKE ONE TABLET (80 MG) BY MOUTH EVERY DAY   Boudreauxs Butt Paste 40 % ointment Generic drug: liver oil-zinc oxide Apply 1 application. topically 2 (two) times daily.   carvedilol 6.25 MG tablet Commonly known as: COREG Take 1 tablet (6.25 mg total) by mouth 2 (two) times daily with a meal.   cyanocobalamin 1000 MCG tablet Commonly known as: VITAMIN B12 Take 1 tablet (1,000 mcg total) by mouth daily.   ferrous sulfate 325 (65 FE) MG tablet Take 1 tablet (325 mg total) by mouth every other day. What changed: additional instructions   finasteride 5 MG tablet Commonly known as: PROSCAR Take 1 tablet (5 mg total) by mouth daily.   furosemide 40 MG tablet Commonly known as: LASIX Take 1 tablet (40 mg total) by mouth daily.   hydrOXYzine 10 MG tablet Commonly known as: ATARAX Take 1 tablet (10 mg total) by mouth 3 (three) times daily as needed for  itching.   insulin aspart 100 UNIT/ML FlexPen Commonly known as: NOVOLOG Inject 5 Units into the skin 3 (three) times daily with meals.   insulin glargine-yfgn 100 UNIT/ML injection Commonly known as: SEMGLEE Inject 0.15 mLs (15 Units total) into the skin daily. Start taking on: November 21, 2023   polyethylene glycol 17 g packet Commonly known as: MIRALAX / GLYCOLAX Take 17 g by mouth daily as needed.   potassium chloride SA 20 MEQ tablet Commonly known as: KLOR-CON M Take 1 tablet (20 mEq total) by mouth daily.   spironolactone 25 MG tablet Commonly known as: ALDACTONE Take 1 tablet (25 mg total) by  mouth daily.   valsartan 40 MG tablet Commonly known as: DIOVAN Take 40 mg by mouth 2 (two) times daily.   Vitamin D3 25 MCG (1000 UT) Caps Take 1 capsule (1,000 Units total) by mouth daily.               Discharge Care Instructions  (From admission, onward)           Start     Ordered   11/20/23 0000  Discharge wound care:       Comments: t to manage wound on buttock, foam dressing, change every 3 days and PRN soilage, assess under dressing each shift   11/20/23 1051            Contact information for follow-up providers     Schmerge, Belenda Cruise, NP Follow up.   Specialty: Nurse Practitioner Why: Hospital follow up Contact information: [address] [place] [address] [phone]         [facility]. Go on 11/24/2023.   Specialty: Cardiology Why: Hospital Follow-Up 11/24/23 @ 10:30 Please bring all medications to follow-up appt [facility] [address] Floor, [address] [employer] Parking @ the [employer]vertising account planner information: [address] [place] [address] [phone]             Contact information for after-discharge care     Destination     [facility] Preferred SNF .   Service: Skilled Nursing Contact information: [address] [place] [address] [phone]                    Discharge Exam: Ceasar Mons Weights   11/13/23 1222  Weight: 103 kg   General.  Frail and obese elderly man, in no acute distress. Pulmonary.  Lungs clear bilaterally, normal respiratory effort. CV.  Regular rate and rhythm, no JVD, rub or murmur. Abdomen.  Soft, nontender, nondistended, BS positive. CNS.  Alert and oriented .  No focal neurologic deficit. Extremities.  No edema, no cyanosis, pulses intact and symmetrical. Psychiatry.  Judgment and insight appears normal.   Condition at discharge:  stable  The results of significant diagnostics from this hospitalization (including imaging, microbiology, ancillary and laboratory) are listed below for reference.   Imaging Studies: ECHOCARDIOGRAM COMPLETE BUBBLE STUDY Result Date: 11/15/2023    ECHOCARDIOGRAM REPORT   Patient Name:   Peter Ryan Date of Exam: 11/15/2023 Medical Rec #:  [id number]       Height:       70.0 in Accession #:    [id number]      Weight:       227.1 lb Date of Birth:  02-11-1951       BSA:          2.203  m Patient Age:    72 years        BP:           159/89 mmHg Patient Gender: M               HR:           86 bpm. Exam Location:  [facility] Procedure: 2D Echo, Saline Contrast Bubble Study and Intracardiac Opacification            Agent (Both Spectral and Color Flow Doppler were utilized during            procedure). Indications:     Stroke I63.9  History:         Patient has prior history of Echocardiogram examinations, most                  recent 12/18/2022.  Sonographer:     Overton Mam RDCS, FASE Referring Phys:  [id number] Andris Baumann Diagnosing Phys: Julien Nordmann MD  Sonographer Comments: Technically challenging study due to limited acoustic windows, suboptimal apical window, suboptimal parasternal window and patient is obese. Image acquisition challenging due to patient body habitus. Acoustically limited study due to both poor sound transmission and body habitus. Saline Microcavitation (Bubble Study) attempted twice, both of suboptimal quality. IMPRESSIONS  1. Left ventricular ejection fraction, by estimation, is 25 to 30%. The left ventricle has severely decreased function. The left ventricle demonstrates global hypokinesis. There is mild left ventricular hypertrophy. Left ventricular diastolic parameters  are consistent with Grade I diastolic dysfunction (impaired relaxation).  2. Right ventricular systolic function is normal. The right ventricular size is normal.  3. The mitral valve is normal in structure. No  evidence of mitral valve regurgitation. No evidence of mitral stenosis.  4. The aortic valve is normal in structure. There is mild calcification of the aortic valve. Aortic valve regurgitation is not visualized. Aortic valve sclerosis/calcification is present, without any evidence of aortic stenosis.  5. The inferior vena cava is normal in size with greater than 50% respiratory variability, suggesting right atrial pressure of 3 mmHg. FINDINGS  Left Ventricle: Left ventricular ejection fraction, by estimation, is 25 to 30%. The left ventricle has severely decreased function. The left ventricle demonstrates global hypokinesis. Definity contrast agent was given IV to delineate the left ventricular endocardial borders. Strain imaging was not performed. The left ventricular internal cavity size was normal in size. There is mild left ventricular hypertrophy. Left ventricular diastolic parameters are consistent with Grade I diastolic dysfunction (impaired relaxation). Right Ventricle: The right ventricular size is normal. No increase in right ventricular wall thickness. Right ventricular systolic function is normal. Left Atrium: Left atrial size was normal in size. Right Atrium: Right atrial size was normal in size. Pericardium: There is no evidence of pericardial effusion. Mitral Valve: The mitral valve is normal in structure. No evidence of mitral valve regurgitation. No evidence of mitral valve stenosis. Tricuspid Valve: The tricuspid valve is normal in structure. Tricuspid valve regurgitation is not demonstrated. No evidence of tricuspid stenosis. Aortic Valve: The aortic valve is normal in structure. There is mild calcification of the aortic valve. Aortic valve regurgitation is not visualized. Aortic valve sclerosis/calcification is present, without any evidence of aortic stenosis. Aortic valve peak gradient measures 6.4 mmHg. Pulmonic Valve: The pulmonic valve was normal in structure. Pulmonic valve regurgitation is  not visualized. No evidence of pulmonic stenosis. Aorta: The aortic root is normal in size and structure. Venous: The inferior vena cava is  normal in size with greater than 50% respiratory variability, suggesting right atrial pressure of 3 mmHg. IAS/Shunts: No atrial level shunt detected by color flow Doppler. Agitated saline contrast was given intravenously to evaluate for intracardiac shunting, though not well visualized. . Additional Comments: 3D imaging was not performed.  LEFT VENTRICLE PLAX 2D LVIDd:         5.35 cm      Diastology LVIDs:         4.70 cm      LV e' medial:    5.11 cm/s LV PW:         1.15 cm      LV E/e' medial:  12.7 LV IVS:        1.25 cm      LV e' lateral:   5.66 cm/s LVOT diam:     2.40 cm      LV E/e' lateral: 11.5 LV SV:         58 LV SV Index:   26 LVOT Area:     4.52 cm  LV Volumes (MOD) LV vol d, MOD A4C: 148.0 ml LV vol s, MOD A4C: 103.0 ml LV SV MOD A4C:     148.0 ml RIGHT VENTRICLE RV Basal diam:  3.40 cm LEFT ATRIUM           Index        RIGHT ATRIUM           Index LA diam:      4.00 cm 1.82 cm/m   RA Area:     23.70 cm LA Vol (A2C): 12.1 ml 5.49 ml/m   RA Volume:   61.60 ml  27.96 ml/m LA Vol (A4C): 47.4 ml 21.52 ml/m  AORTIC VALVE AV Area (Vmax): 2.54 cm AV Vmax:        126.95 cm/s AV Peak Grad:   6.4 mmHg LVOT Vmax:      71.20 cm/s LVOT Vmean:     43.500 cm/s LVOT VTI:       0.129 m  AORTA Ao Root diam: 3.90 cm Ao Asc diam:  3.60 cm MITRAL VALVE MV Area (PHT): 6.12 cm    SHUNTS MV Decel Time: 124 msec    Systemic VTI:  0.13 m MV E velocity: 65.00 cm/s  Systemic Diam: 2.40 cm MV A velocity: 84.90 cm/s MV E/A ratio:  0.77 Julien Nordmann MD Electronically signed by Julien Nordmann MD Signature Date/Time: 11/15/2023/3:09:26 PM    Final    CT ANGIO HEAD NECK W WO CM Result Date: 11/14/2023 CLINICAL DATA:  abnormal mri. EXAM: CT ANGIOGRAPHY HEAD AND NECK WITH AND WITHOUT CONTRAST TECHNIQUE: Multidetector CT imaging of the head and neck was performed using the standard  protocol during bolus administration of intravenous contrast. Multiplanar CT image reconstructions and MIPs were obtained to evaluate the vascular anatomy. Carotid stenosis measurements (when applicable) are obtained utilizing NASCET criteria, using the distal internal carotid diameter as the denominator. RADIATION DOSE REDUCTION: This exam was performed according to the departmental dose-optimization program which includes automated exposure control, adjustment of the mA and/or kV according to patient size and/or use of iterative reconstruction technique. CONTRAST:  75mL OMNIPAQUE IOHEXOL 350 MG/ML SOLN COMPARISON:  CTA head/neck March 3, 22. FINDINGS: CTA NECK FINDINGS Aortic arch: Great vessel origins are patent without significant stenosis. Right carotid system: No evidence of dissection, stenosis (50% or greater), or occlusion. Left carotid system: No evidence of dissection, stenosis (50% or greater), or occlusion. Vertebral arteries: Approximately 60% stenosis  of the proximal right V2 vertebral artery. Skeleton: No acute abnormality limited assessment. Other neck: No acute abnormality on limited assessment. Upper chest: Visualized lung apices are clear. Review of the MIP images confirms the above findings CTA HEAD FINDINGS Anterior circulation: Bilateral intracranial ICAs, MCAs and ACAs are patent without proximal hemodynamically significant stenosis. Hypoplastic left A1 ACA. Posterior circulation: Occluded left V3 vertebral artery. Right intradural vertebral artery is patent with moderate stenosis. Left vertebral artery is not opacified. Basilar artery and bilateral posterior cerebral arteries are patent. Venous sinuses: Not well assessed due to non arterial timing. At Review of the MIP images confirms the above findings IMPRESSION: 1. Occluded left V3 vertebral artery which could be due to atherosclerosis or dissection. This finding is age indeterminate but new since 2022. 2. Approximately 60% stenosis of  the proximal right V2 vertebral artery. Electronically Signed   By: Feliberto Harts M.D.   On: 11/14/2023 01:31   MR BRAIN WO CONTRAST Result Date: 11/13/2023 CLINICAL DATA:  Provided history: Aphagia. Additional history provided: Slurred speech, left-sided weakness. EXAM: MRI HEAD WITHOUT CONTRAST TECHNIQUE: Multiplanar, multiecho pulse sequences of the brain and surrounding structures were obtained without intravenous contrast. COMPARISON:  Non-contrast head CT 11/13/2023.  Brain MRI 11/30/2020. FINDINGS: Brain: Moderate generalized cerebral atrophy. Mild cerebellar atrophy. Commensurate prominence of the ventricles and sulci. Chronic lacunar infarcts within the bilateral cerebral hemispheric white matter and within/about the bilateral deep gray nuclei. Chronic infarct within the pons (involving much of the pontine cross-section). Adjacent Wallerian degeneration. Chronic infarct within the left ventral aspect of the medulla, new from the prior MRI of 11/30/2020 (series 8, image 4). Tiny chronic infarcts within the inferior right cerebellar hemisphere. Patchy and confluent T2 FLAIR hyperintense signal abnormality elsewhere within the cerebral white matter, nonspecific but compatible with chronic small vessel ischemic disease. Chronic microhemorrhages within the bilateral thalami. Small chronic hemorrhage within the right external capsule. There is no acute infarct. No evidence of an intracranial mass. No extra-axial fluid collection. No midline shift. Vascular: Signal abnormality within the left vertebral artery at the V3/V4 junction, which may reflect high-grade stenosis or vessel occlusion, new from the prior brain MRI 11/30/2020. Skull and upper cervical spine: No focal worrisome marrow lesion. Sinuses/Orbits: No mass or acute finding within the imaged orbits. Prior right ocular lens replacement. No significant paranasal sinus disease. IMPRESSION: 1. No evidence of an acute or recent subacute infarction. 2.  Parenchymal atrophy, advanced chronic small vessel ischemic disease and chronic infarcts, as described. A chronic infarct within the left ventral aspect of the medulla is new from the prior MRI of 11/30/2020. 3. Also new from the prior MRI, there is signal abnormality within the left vertebral artery at the V3/V4 junction, which may reflect high-grade stenosis or vessel occlusion. Consider CT or MR angiography for further evaluation. 4. Small chronic hemorrhage within the right external capsule. 5. Chronic microhemorrhages within the bilateral thalami, likely reflecting sequelae of chronic hypertensive microangiopathy. Electronically Signed   By: Jackey Loge D.O.   On: 11/13/2023 18:25   US Venous Img Lower Unilateral Right Result Date: 11/13/2023 CLINICAL DATA:  Right lower extremity edema EXAM: RIGHT LOWER EXTREMITY VENOUS DOPPLER ULTRASOUND TECHNIQUE: Gray-scale sonography with compression, as well as color and duplex ultrasound, were performed to evaluate the deep venous system(s) from the level of the common femoral vein through the popliteal and proximal calf veins. COMPARISON:  None Available. FINDINGS: VENOUS Normal compressibility of the common femoral, superficial femoral, and popliteal veins, as well as  the visualized calf veins. Visualized portions of profunda femoral vein and great saphenous vein unremarkable. No filling defects to suggest DVT on grayscale or color Doppler imaging. Doppler waveforms show normal direction of venous flow, normal respiratory plasticity and response to augmentation. Limited views of the contralateral common femoral vein are unremarkable. OTHER None. Limitations: none IMPRESSION: Negative. Electronically Signed   By: Malachy Moan M.D.   On: 11/13/2023 16:40   CT HEAD WO CONTRAST Result Date: 11/13/2023 CLINICAL DATA:  Neuro deficit, acute, stroke suspected. Slurred speech and left-sided weakness. History of stroke. EXAM: CT HEAD WITHOUT CONTRAST TECHNIQUE:  Contiguous axial images were obtained from the base of the skull through the vertex without intravenous contrast. RADIATION DOSE REDUCTION: This exam was performed according to the departmental dose-optimization program which includes automated exposure control, adjustment of the mA and/or kV according to patient size and/or use of iterative reconstruction technique. COMPARISON:  Head CT and MRI 11/30/2020 FINDINGS: Brain: There is no evidence of an acute infarct, intracranial hemorrhage, mass, midline shift, or extra-axial fluid collection. Patchy and confluent hypodensities in the cerebral white matter bilaterally are similar to the prior CT and are nonspecific but compatible with severe chronic small vessel ischemic disease. Chronic lacunar infarcts are again seen involving the deep gray nuclei and white matter bilaterally. There is mild cerebral atrophy. Vascular: Calcified atherosclerosis at the skull base. No hyperdense vessel. Skull: No acute fracture or suspicious osseous lesion. Sinuses/Orbits: Paranasal sinuses and mastoid air cells are clear. Right cataract extraction. Other: None. IMPRESSION: 1. No evidence of acute intracranial abnormality. 2. Severe chronic small vessel ischemic disease. Electronically Signed   By: Sebastian Ache M.D.   On: 11/13/2023 13:33    Microbiology: Results for orders placed or performed during the hospital encounter of 12/16/22  Resp Panel by RT-PCR (Flu A&B, Covid) Anterior Nasal Swab     Status: None   Collection Time: 12/17/22 11:52 PM   Specimen: Anterior Nasal Swab  Result Value Ref Range Status   SARS Coronavirus 2 by RT PCR NEGATIVE NEGATIVE Final    Comment: (NOTE) SARS-CoV-2 target nucleic acids are NOT DETECTED.  The SARS-CoV-2 RNA is generally detectable in upper respiratory specimens during the acute phase of infection. The lowest concentration of SARS-CoV-2 viral copies this assay can detect is 138 copies/mL. A negative result does not preclude  SARS-Cov-2 infection and should not be used as the sole basis for treatment or other patient management decisions. A negative result may occur with  improper specimen collection/handling, submission of specimen other than nasopharyngeal swab, presence of viral mutation(s) within the areas targeted by this assay, and inadequate number of viral copies(<138 copies/mL). A negative result must be combined with clinical observations, patient history, and epidemiological information. The expected result is Negative.  Fact Sheet for Patients:  BloggerCourse.com  Fact Sheet for Healthcare Providers:  SeriousBroker.it  This test is no t yet approved or cleared by the [place] FDA and  has been authorized for detection and/or diagnosis of SARS-CoV-2 by FDA under an Emergency Use Authorization (EUA). This EUA will remain  in effect (meaning this test can be used) for the duration of the COVID-19 declaration under Section 564(b)(1) of the Act, 21 U.S.C.section 360bbb-3(b)(1), unless the authorization is terminated  or revoked sooner.       Influenza A by PCR NEGATIVE NEGATIVE Final   Influenza B by PCR NEGATIVE NEGATIVE Final    Comment: (NOTE) The Xpert Xpress SARS-CoV-2/FLU/RSV plus assay is intended as an aid  in the diagnosis of influenza from Nasopharyngeal swab specimens and should not be used as a sole basis for treatment. Nasal washings and aspirates are unacceptable for Xpert Xpress SARS-CoV-2/FLU/RSV testing.  Fact Sheet for Patients: BloggerCourse.com  Fact Sheet for Healthcare Providers: SeriousBroker.it  This test is not yet approved or cleared by the [place] FDA and has been authorized for detection and/or diagnosis of SARS-CoV-2 by FDA under an Emergency Use Authorization (EUA). This EUA will remain in effect (meaning this test can be used) for the duration of  the COVID-19 declaration under Section 564(b)(1) of the Act, 21 U.S.C. section 360bbb-3(b)(1), unless the authorization is terminated or revoked.  Performed at [facility], [address] [place], [place] [address]     Labs: CBC: Recent Labs  Lab 11/13/23 1225 11/16/23 1001 11/17/23 0641 11/18/23 1005  WBC 9.1 9.3 9.9 9.8  NEUTROABS 6.4 6.3  --   --   HGB 15.8 14.3 14.5 14.7  HCT 47.9 42.6 43.5 43.7  MCV 87.1 85.7 85.6 85.9  PLT 262 208 226 226   Basic Metabolic Panel: Recent Labs  Lab 11/13/23 1225 11/16/23 1001 11/17/23 0917 11/18/23 1005  NA 139 140 141 140  K 3.7 2.8* 2.9* 3.2*  CL 101 104 103 104  CO2 25 24 26 23   GLUCOSE 253* 200* 210* 211*  BUN 10 15 17 20   CREATININE 0.69 0.76 0.83 0.89  CALCIUM 9.0 8.5* 8.7* 8.6*  MG  --   --  1.7  --    Liver Function Tests: Recent Labs  Lab 11/13/23 1225 11/16/23 1001  AST 27 15  ALT 15 15  ALKPHOS 63 55  BILITOT 0.5 0.7  PROT 8.1 7.0  ALBUMIN 3.8 3.3*   CBG: Recent Labs  Lab 11/19/23 0846 11/19/23 1224 11/19/23 1657 11/19/23 2008 11/20/23 0746  GLUCAP 214* 198* 154* 209* 184*    Discharge time spent: greater than 30 minutes.  This record has been created using Conservation officer, historic buildings. Errors have been sought and corrected,but may not always be located. Such creation errors do not reflect on the standard of care.   Signed: Arnetha Courser, MD [facility] 11/20/2023

## 2023-11-20 NOTE — TOC Transition Note (Signed)
Transition of Care [facility]) - Discharge Note   Patient Details  Name: Peter Ryan MRN: [id number] Date of Birth: 11/28/1950  [facility]) CM/SW Contact:  Garret Reddish, RN Phone Number: 11/20/2023, 10:59 AM   Clinical Narrative:    Chart reviewed.  Noted that patient has orders for discharge today.  I have spoken with Alvino Chapel with [facility] and she informs me that she will have a bed for the patient today.  I have sent Discharge Summary, Discharge Orders, and SNF Transfer Report via Epic hub.  Alvino Chapel reports that patient will go to room 106 and the number to call report is [phone].  I have informed Mrs. Risse that patient will discharge to [facility] today.  I have informed Mrs. Brecht that [facility] will transport patient to the facility today.    I have arranged [facility] to tranport [facility] today.    I have informed staff nurse of the above information.     Final next level of care: Skilled Nursing Facility Barriers to Discharge: No Barriers Identified   Patient Goals and CMS Choice Patient states their goals for this hospitalization and ongoing recovery are:: Home with home health CMS Medicare.gov Compare Post Acute Care list provided to:: Patient Choice offered to / list presented to : Patient [place] ownership interest in [facility].provided to:: Patient    Discharge Placement              Patient chooses bed at: [facility] Patient to be transferred to facility by: [facility] Name of family member notified: Mrs. Kempker Patient and family notified of of transfer: 11/20/23  Discharge Plan and Services Additional resources added to the After Visit Summary for       Post Acute Care Choice: Home Health                    HH Arranged: PT, OT [facility] Agency: [facility] Date [facility] Agency Contacted: 11/16/23 Time HH Agency Contacted: 1205 Representative spoke with  at [facility] Agency: Herbert Seta (Patient already active, notified of admission and plan to restart services)  Social Drivers of Health (SDOH) Interventions SDOH Screenings   Food Insecurity: No Food Insecurity (11/14/2023)  Housing: Low Risk  (11/17/2023)  Transportation Needs: No Transportation Needs (11/17/2023)  Utilities: Not At Risk (11/14/2023)  Alcohol Screen: Low Risk  (10/17/2021)  Depression (PHQ2-9): Low Risk  (10/17/2021)  Financial Resource Strain: Medium Risk (11/17/2023)  Physical Activity: Unknown (11/17/2023)  Social Connections: Socially Isolated (11/14/2023)  Stress: No Stress Concern Present (10/17/2021)  Tobacco Use: Medium Risk (11/17/2023)     Readmission Risk Interventions     No data to display

## 2023-11-24 ENCOUNTER — Encounter: Payer: Medicare Other | Admitting: Family

## 2023-12-01 ENCOUNTER — Telehealth: Payer: Self-pay | Admitting: Family

## 2023-12-01 NOTE — Telephone Encounter (Signed)
 Lvm to r/s appt missed on 11/24/23

## 2023-12-12 ENCOUNTER — Other Ambulatory Visit: Payer: Self-pay

## 2023-12-12 ENCOUNTER — Emergency Department (HOSPITAL_COMMUNITY)

## 2023-12-12 ENCOUNTER — Encounter (HOSPITAL_COMMUNITY): Payer: Self-pay

## 2023-12-12 ENCOUNTER — Inpatient Hospital Stay (HOSPITAL_COMMUNITY)
Admission: EM | Admit: 2023-12-12 | Discharge: 2023-12-18 | DRG: 193 | Disposition: A | Discharge status: Skilled Nursing Facility | Source: Skilled Nursing Facility | Attending: Internal Medicine | Admitting: Internal Medicine

## 2023-12-12 DIAGNOSIS — E860 Dehydration: Secondary | ICD-10-CM | POA: Diagnosis present

## 2023-12-12 DIAGNOSIS — J111 Influenza due to unidentified influenza virus with other respiratory manifestations: Secondary | ICD-10-CM

## 2023-12-12 DIAGNOSIS — E119 Type 2 diabetes mellitus without complications: Secondary | ICD-10-CM

## 2023-12-12 DIAGNOSIS — J159 Unspecified bacterial pneumonia: Secondary | ICD-10-CM | POA: Diagnosis present

## 2023-12-12 DIAGNOSIS — J1008 Influenza due to other identified influenza virus with other specified pneumonia: Secondary | ICD-10-CM | POA: Diagnosis present

## 2023-12-12 DIAGNOSIS — I6930 Unspecified sequelae of cerebral infarction: Secondary | ICD-10-CM

## 2023-12-12 DIAGNOSIS — R1310 Dysphagia, unspecified: Secondary | ICD-10-CM | POA: Diagnosis present

## 2023-12-12 DIAGNOSIS — J9601 Acute respiratory failure with hypoxia: Secondary | ICD-10-CM | POA: Diagnosis present

## 2023-12-12 DIAGNOSIS — K219 Gastro-esophageal reflux disease without esophagitis: Secondary | ICD-10-CM | POA: Diagnosis present

## 2023-12-12 DIAGNOSIS — E785 Hyperlipidemia, unspecified: Secondary | ICD-10-CM | POA: Diagnosis present

## 2023-12-12 DIAGNOSIS — R0602 Shortness of breath: Secondary | ICD-10-CM

## 2023-12-12 DIAGNOSIS — Z794 Long term (current) use of insulin: Secondary | ICD-10-CM

## 2023-12-12 DIAGNOSIS — N138 Other obstructive and reflux uropathy: Secondary | ICD-10-CM | POA: Diagnosis present

## 2023-12-12 DIAGNOSIS — Z6835 Body mass index (BMI) 35.0-35.9, adult: Secondary | ICD-10-CM

## 2023-12-12 DIAGNOSIS — E1165 Type 2 diabetes mellitus with hyperglycemia: Secondary | ICD-10-CM | POA: Diagnosis present

## 2023-12-12 DIAGNOSIS — E1159 Type 2 diabetes mellitus with other circulatory complications: Secondary | ICD-10-CM | POA: Diagnosis present

## 2023-12-12 DIAGNOSIS — R051 Acute cough: Secondary | ICD-10-CM

## 2023-12-12 DIAGNOSIS — Z833 Family history of diabetes mellitus: Secondary | ICD-10-CM

## 2023-12-12 DIAGNOSIS — Z1152 Encounter for screening for COVID-19: Secondary | ICD-10-CM | POA: Diagnosis not present

## 2023-12-12 DIAGNOSIS — R0902 Hypoxemia: Principal | ICD-10-CM

## 2023-12-12 DIAGNOSIS — E1140 Type 2 diabetes mellitus with diabetic neuropathy, unspecified: Secondary | ICD-10-CM | POA: Diagnosis present

## 2023-12-12 DIAGNOSIS — E861 Hypovolemia: Secondary | ICD-10-CM | POA: Diagnosis present

## 2023-12-12 DIAGNOSIS — E1169 Type 2 diabetes mellitus with other specified complication: Secondary | ICD-10-CM | POA: Diagnosis present

## 2023-12-12 DIAGNOSIS — I152 Hypertension secondary to endocrine disorders: Secondary | ICD-10-CM | POA: Diagnosis present

## 2023-12-12 DIAGNOSIS — N401 Enlarged prostate with lower urinary tract symptoms: Secondary | ICD-10-CM | POA: Diagnosis present

## 2023-12-12 DIAGNOSIS — Z7901 Long term (current) use of anticoagulants: Secondary | ICD-10-CM

## 2023-12-12 DIAGNOSIS — Z79899 Other long term (current) drug therapy: Secondary | ICD-10-CM

## 2023-12-12 DIAGNOSIS — Z7401 Bed confinement status: Secondary | ICD-10-CM

## 2023-12-12 DIAGNOSIS — M109 Gout, unspecified: Secondary | ICD-10-CM | POA: Diagnosis present

## 2023-12-12 DIAGNOSIS — K7460 Unspecified cirrhosis of liver: Secondary | ICD-10-CM | POA: Diagnosis present

## 2023-12-12 DIAGNOSIS — I480 Paroxysmal atrial fibrillation: Secondary | ICD-10-CM | POA: Diagnosis present

## 2023-12-12 DIAGNOSIS — E66811 Obesity, class 1: Secondary | ICD-10-CM | POA: Diagnosis present

## 2023-12-12 DIAGNOSIS — I5022 Chronic systolic (congestive) heart failure: Secondary | ICD-10-CM | POA: Diagnosis present

## 2023-12-12 DIAGNOSIS — Z8572 Personal history of non-Hodgkin lymphomas: Secondary | ICD-10-CM

## 2023-12-12 DIAGNOSIS — N179 Acute kidney failure, unspecified: Secondary | ICD-10-CM | POA: Diagnosis present

## 2023-12-12 DIAGNOSIS — Z66 Do not resuscitate: Secondary | ICD-10-CM | POA: Diagnosis present

## 2023-12-12 DIAGNOSIS — I69351 Hemiplegia and hemiparesis following cerebral infarction affecting right dominant side: Secondary | ICD-10-CM | POA: Diagnosis not present

## 2023-12-12 DIAGNOSIS — Z809 Family history of malignant neoplasm, unspecified: Secondary | ICD-10-CM

## 2023-12-12 DIAGNOSIS — T380X5A Adverse effect of glucocorticoids and synthetic analogues, initial encounter: Secondary | ICD-10-CM | POA: Diagnosis present

## 2023-12-12 DIAGNOSIS — Z888 Allergy status to other drugs, medicaments and biological substances status: Secondary | ICD-10-CM

## 2023-12-12 DIAGNOSIS — Z87891 Personal history of nicotine dependence: Secondary | ICD-10-CM

## 2023-12-12 HISTORY — DX: Unspecified sequelae of cerebral infarction: I69.30

## 2023-12-12 HISTORY — DX: Paroxysmal atrial fibrillation: I48.0

## 2023-12-12 HISTORY — DX: Chronic systolic (congestive) heart failure: I50.22

## 2023-12-12 LAB — CBC WITH DIFFERENTIAL/PLATELET
Abs Immature Granulocytes: 0.11 10*3/uL — ABNORMAL HIGH (ref 0.00–0.07)
Basophils Absolute: 0.1 10*3/uL (ref 0.0–0.1)
Basophils Relative: 0 %
Eosinophils Absolute: 0 10*3/uL (ref 0.0–0.5)
Eosinophils Relative: 0 %
HCT: 43 % (ref 39.0–52.0)
Hemoglobin: 13.8 g/dL (ref 13.0–17.0)
Immature Granulocytes: 1 %
Lymphocytes Relative: 10 %
Lymphs Abs: 1.6 10*3/uL (ref 0.7–4.0)
MCH: 28.8 pg (ref 26.0–34.0)
MCHC: 32.1 g/dL (ref 30.0–36.0)
MCV: 89.8 fL (ref 80.0–100.0)
Monocytes Absolute: 1.4 10*3/uL — ABNORMAL HIGH (ref 0.1–1.0)
Monocytes Relative: 8 %
Neutro Abs: 13.3 10*3/uL — ABNORMAL HIGH (ref 1.7–7.7)
Neutrophils Relative %: 81 %
Platelets: 197 10*3/uL (ref 150–400)
RBC: 4.79 MIL/uL (ref 4.22–5.81)
RDW: 15.9 % — ABNORMAL HIGH (ref 11.5–15.5)
WBC: 16.5 10*3/uL — ABNORMAL HIGH (ref 4.0–10.5)
nRBC: 0 % (ref 0.0–0.2)

## 2023-12-12 LAB — COMPREHENSIVE METABOLIC PANEL
ALT: 22 U/L (ref 0–44)
AST: 25 U/L (ref 15–41)
Albumin: 3.3 g/dL — ABNORMAL LOW (ref 3.5–5.0)
Alkaline Phosphatase: 61 U/L (ref 38–126)
Anion gap: 10 (ref 5–15)
BUN: 35 mg/dL — ABNORMAL HIGH (ref 8–23)
CO2: 22 mmol/L (ref 22–32)
Calcium: 8.8 mg/dL — ABNORMAL LOW (ref 8.9–10.3)
Chloride: 109 mmol/L (ref 98–111)
Creatinine, Ser: 1.77 mg/dL — ABNORMAL HIGH (ref 0.61–1.24)
GFR, Estimated: 41 mL/min — ABNORMAL LOW (ref >60)
Glucose, Bld: 160 mg/dL — ABNORMAL HIGH (ref 70–99)
Potassium: 4 mmol/L (ref 3.5–5.1)
Sodium: 141 mmol/L (ref 135–145)
Total Bilirubin: 0.9 mg/dL (ref 0.0–1.2)
Total Protein: 7.6 g/dL (ref 6.5–8.1)

## 2023-12-12 LAB — BRAIN NATRIURETIC PEPTIDE: B Natriuretic Peptide: 233.2 pg/mL — ABNORMAL HIGH (ref 0.0–100.0)

## 2023-12-12 MED ORDER — LACTATED RINGERS IV SOLN: INTRAVENOUS | Status: AC

## 2023-12-12 MED ORDER — OSELTAMIVIR PHOSPHATE 75 MG PO CAPS
75.0000 mg | ORAL_CAPSULE | Freq: Once | ORAL | Status: AC
Start: 2023-12-13 — End: 2023-12-13
  Administered 2023-12-13: 75 mg via ORAL
  Filled 2023-12-12: qty 1

## 2023-12-12 MED ORDER — PROCHLORPERAZINE EDISYLATE 10 MG/2ML IJ SOLN: 10.0000 mg | Freq: Four times a day (QID) | INTRAMUSCULAR | Status: DC | PRN

## 2023-12-12 MED ORDER — APIXABAN 5 MG PO TABS
5.0000 mg | ORAL_TABLET | Freq: Two times a day (BID) | ORAL | Status: DC
  Administered 2023-12-13 – 2023-12-18 (×11): 5 mg via ORAL
  Filled 2023-12-12 (×11): qty 1

## 2023-12-12 MED ORDER — OSELTAMIVIR PHOSPHATE 30 MG PO CAPS
30.0000 mg | ORAL_CAPSULE | Freq: Two times a day (BID) | ORAL | Status: DC
  Administered 2023-12-13 – 2023-12-14 (×4): 30 mg via ORAL
  Filled 2023-12-12 (×3): qty 1

## 2023-12-12 MED ORDER — ATORVASTATIN CALCIUM 80 MG PO TABS
80.0000 mg | ORAL_TABLET | Freq: Every day | ORAL | Status: DC
  Administered 2023-12-13 – 2023-12-18 (×6): 80 mg via ORAL
  Filled 2023-12-12 (×2): qty 1
  Filled 2023-12-12: qty 2
  Filled 2023-12-12 (×3): qty 1

## 2023-12-12 MED ORDER — HYDROXYZINE HCL 10 MG PO TABS
10.0000 mg | ORAL_TABLET | Freq: Three times a day (TID) | ORAL | Status: DC | PRN
Start: 2023-12-12 — End: 2023-12-18

## 2023-12-12 MED ORDER — ACETAMINOPHEN 650 MG RE SUPP: 650.0000 mg | Freq: Four times a day (QID) | RECTAL | Status: DC | PRN

## 2023-12-12 MED ORDER — SENNOSIDES-DOCUSATE SODIUM 8.6-50 MG PO TABS: 1.0000 | ORAL_TABLET | Freq: Every evening | ORAL | Status: DC | PRN

## 2023-12-12 MED ORDER — METHYLPREDNISOLONE SODIUM SUCC 125 MG IJ SOLR
80.0000 mg | INTRAMUSCULAR | Status: DC
  Administered 2023-12-13: 80 mg via INTRAVENOUS
  Filled 2023-12-12: qty 2

## 2023-12-12 MED ORDER — SODIUM CHLORIDE 0.9% FLUSH
3.0000 mL | Freq: Two times a day (BID) | INTRAVENOUS | Status: DC
  Administered 2023-12-13 – 2023-12-18 (×8): 3 mL via INTRAVENOUS

## 2023-12-12 MED ORDER — SODIUM CHLORIDE 0.9 % IV SOLN
2.0000 g | INTRAVENOUS | Status: AC
  Administered 2023-12-13 – 2023-12-16 (×5): 2 g via INTRAVENOUS
  Filled 2023-12-12 (×5): qty 20

## 2023-12-12 MED ORDER — ACETAMINOPHEN 325 MG PO TABS: 650.0000 mg | ORAL_TABLET | Freq: Four times a day (QID) | ORAL | Status: DC | PRN

## 2023-12-12 MED ORDER — CARVEDILOL 6.25 MG PO TABS
6.2500 mg | ORAL_TABLET | Freq: Two times a day (BID) | ORAL | Status: DC
  Administered 2023-12-13 – 2023-12-18 (×10): 6.25 mg via ORAL
  Filled 2023-12-12 (×6): qty 1
  Filled 2023-12-12: qty 2
  Filled 2023-12-12 (×4): qty 1

## 2023-12-12 MED ORDER — DOXYCYCLINE HYCLATE 100 MG PO TABS
100.0000 mg | ORAL_TABLET | Freq: Two times a day (BID) | ORAL | Status: AC
Start: 2023-12-13 — End: 2023-12-17
  Administered 2023-12-13 – 2023-12-17 (×10): 100 mg via ORAL
  Filled 2023-12-12 (×10): qty 1

## 2023-12-12 MED ORDER — SODIUM CHLORIDE 0.9 % IV BOLUS
500.0000 mL | Freq: Once | INTRAVENOUS | Status: AC
  Administered 2023-12-12: 500 mL via INTRAVENOUS

## 2023-12-12 MED ORDER — BISACODYL 5 MG PO TBEC
5.0000 mg | DELAYED_RELEASE_TABLET | Freq: Every day | ORAL | Status: DC | PRN
  Administered 2023-12-17: 5 mg via ORAL
  Filled 2023-12-12: qty 1

## 2023-12-12 MED ORDER — FINASTERIDE 5 MG PO TABS
5.0000 mg | ORAL_TABLET | Freq: Every day | ORAL | Status: DC
  Administered 2023-12-13 – 2023-12-18 (×6): 5 mg via ORAL
  Filled 2023-12-12 (×6): qty 1

## 2023-12-12 MED ORDER — GUAIFENESIN ER 600 MG PO TB12
600.0000 mg | ORAL_TABLET | Freq: Two times a day (BID) | ORAL | Status: DC
  Administered 2023-12-13 – 2023-12-18 (×12): 600 mg via ORAL
  Filled 2023-12-12 (×12): qty 1

## 2023-12-12 NOTE — Hospital Course (Signed)
 Peter Ryan is a 73 y.o. 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

## 2023-12-12 NOTE — ED Provider Notes (Signed)
 [place] EMERGENCY DEPARTMENT AT [facility] Provider Note   CSN: [id number] Arrival date & time: 12/12/23  1958     History  Chief Complaint  Patient presents with   Fever   Altered Mental Status    Peter Ryan is a 73 y.o. male.  The history is provided by the patient and medical records. No language interpreter was used.  Fever Max temp prior to arrival:  100.6 Temp source:  Oral Severity:  Moderate Onset quality:  Gradual Duration:  2 days Timing:  Constant Progression:  Waxing and waning Chronicity:  New Relieved by:  Nothing Worsened by:  Nothing Ineffective treatments:  None tried Associated symptoms: chills, confusion, congestion and cough   Associated symptoms: no chest pain, no diarrhea, no headaches, no nausea, no rash and no vomiting   Altered Mental Status Presenting symptoms: confusion and partial responsiveness   Severity:  Severe Most recent episode:  Today Progression:  Improving Chronicity:  New Associated symptoms: fever   Associated symptoms: no abdominal pain, no headaches, no nausea, no palpitations, no rash and no vomiting        Home Medications Prior to Admission medications   Medication Sig Start Date End Date Taking? Authorizing Provider  acetaminophen (TYLENOL) 325 MG tablet Take 2 tablets (650 mg total) by mouth every 4 (four) hours as needed for mild pain (or temp > 37.5 C (99.5 F)). 07/10/20   Angiulli, Mcarthur Rossetti, PA-C  allopurinol (ZYLOPRIM) 300 MG tablet TAKE ONE TABLET EVERY DAY 06/11/21   Bosie Clos, MD  apixaban (ELIQUIS) 5 MG TABS tablet Take 1 tablet (5 mg total) by mouth 2 (two) times daily. 12/23/22 01/22/23  Tresa Moore, MD  atorvastatin (LIPITOR) 80 MG tablet TAKE ONE TABLET (80 MG) BY MOUTH EVERY DAY 07/09/21   Bosie Clos, MD  BOUDREAUXS BUTT PASTE 40 % ointment Apply 1 application. topically 2 (two) times daily. 10/23/21   [provider]  carvedilol (COREG) 6.25 MG tablet Take  1 tablet (6.25 mg total) by mouth 2 (two) times daily with a meal. Patient not taking: Reported on 11/14/2023 12/23/22 01/22/23  Tresa Moore, MD  Cholecalciferol (VITAMIN D3) 25 MCG (1000 UT) CAPS Take 1 capsule (1,000 Units total) by mouth daily. 07/10/20   Angiulli, Mcarthur Rossetti, PA-C  ferrous sulfate 325 (65 FE) MG tablet Take 1 tablet (325 mg total) by mouth every other day. Patient taking differently: Take 325 mg by mouth every other day. Mon/wed/friday 02/09/22 12/16/22  Lurene Shadow, MD  finasteride (PROSCAR) 5 MG tablet Take 1 tablet (5 mg total) by mouth daily. 03/01/21   Michiel Cowboy A, PA-C  furosemide (LASIX) 40 MG tablet Take 1 tablet (40 mg total) by mouth daily. 12/23/22 11/14/23  Tresa Moore, MD  hydrOXYzine (ATARAX) 10 MG tablet Take 1 tablet (10 mg total) by mouth 3 (three) times daily as needed for itching. 11/20/23   Arnetha Courser, MD  insulin aspart (NOVOLOG) 100 UNIT/ML FlexPen Inject 5 Units into the skin 3 (three) times daily with meals. 11/20/23   Arnetha Courser, MD  insulin glargine-yfgn (SEMGLEE) 100 UNIT/ML injection Inject 0.15 mLs (15 Units total) into the skin daily. 11/21/23   Arnetha Courser, MD  polyethylene glycol (MIRALAX / GLYCOLAX) 17 g packet Take 17 g by mouth daily as needed. 02/09/22   Lurene Shadow, MD  potassium chloride SA (KLOR-CON M) 20 MEQ tablet Take 1 tablet (20 mEq total) by mouth daily. 11/20/23   Arnetha Courser, MD  spironolactone (ALDACTONE) 25 MG tablet Take 1 tablet (25 mg total) by mouth daily. 12/23/22 11/14/23  Tresa Moore, MD  valsartan (DIOVAN) 40 MG tablet Take 40 mg by mouth 2 (two) times daily.    [provider]  vitamin B-12 (CYANOCOBALAMIN) 1000 MCG tablet Take 1 tablet (1,000 mcg total) by mouth daily. Patient not taking: Reported on 11/14/2023 09/04/20   Bosie Clos, MD      Allergies    Hctz [hydrochlorothiazide] and Metformin    Review of Systems   Review of Systems  Constitutional:  Positive for  chills, fatigue and fever.  HENT:  Positive for congestion.   Respiratory:  Positive for cough and shortness of breath. Negative for chest tightness and wheezing.   Cardiovascular:  Negative for chest pain, palpitations and leg swelling.  Gastrointestinal:  Negative for abdominal pain, constipation, diarrhea, nausea and vomiting.  Genitourinary:  Negative for flank pain and frequency.  Musculoskeletal:  Negative for back pain, neck pain and neck stiffness.  Skin:  Negative for rash.  Neurological:  Negative for headaches.  Psychiatric/Behavioral:  Positive for confusion.   All other systems reviewed and are negative.   Physical Exam Updated Vital Signs BP (!) 141/77   Pulse 74   Temp 100.2 F (37.9 C) (Rectal)   Resp (!) 24   Ht 5\' 10"  (1.778 m)   Wt 113.4 kg   SpO2 100%   BMI 35.87 kg/m  Physical Exam Vitals and nursing note reviewed.  Constitutional:      General: He is not in acute distress.    Appearance: He is well-developed. He is not ill-appearing, toxic-appearing or diaphoretic.  HENT:     Head: Normocephalic and atraumatic.     Nose: Congestion present. No rhinorrhea.     Mouth/Throat:     Mouth: Mucous membranes are moist.     Pharynx: No oropharyngeal exudate or posterior oropharyngeal erythema.  Eyes:     Conjunctiva/sclera: Conjunctivae normal.     Pupils: Pupils are equal, round, and reactive to light.  Cardiovascular:     Rate and Rhythm: Normal rate and regular rhythm.     Pulses: Normal pulses.     Heart sounds: No murmur heard. Pulmonary:     Effort: Pulmonary effort is normal. No respiratory distress.     Breath sounds: Rhonchi present. No wheezing or rales.  Chest:     Chest wall: No tenderness.  Abdominal:     Palpations: Abdomen is soft.     Tenderness: There is no abdominal tenderness. There is no right CVA tenderness, left CVA tenderness, guarding or rebound.  Musculoskeletal:        General: No swelling or tenderness.     Cervical back:  Neck supple. No tenderness.     Right lower leg: No edema.     Left lower leg: No edema.  Skin:    General: Skin is warm and dry.     Capillary Refill: Capillary refill takes less than 2 seconds.     Findings: No erythema.  Neurological:     General: No focal deficit present.     Mental Status: He is alert.  Psychiatric:        Mood and Affect: Mood normal.     ED Results / Procedures / Treatments   Labs (all labs ordered are listed, but only abnormal results are displayed) Labs Reviewed  CBC WITH DIFFERENTIAL/PLATELET - Abnormal; Notable for the following components:      Result Value  WBC 16.5 (*)    RDW 15.9 (*)    Neutro Abs 13.3 (*)    Monocytes Absolute 1.4 (*)    Abs Immature Granulocytes 0.11 (*)    All other components within normal limits  COMPREHENSIVE METABOLIC PANEL - Abnormal; Notable for the following components:   Glucose, Bld 160 (*)    BUN 35 (*)    Creatinine, Ser 1.77 (*)    Calcium 8.8 (*)    Albumin 3.3 (*)    GFR, Estimated 41 (*)    All other components within normal limits  BRAIN NATRIURETIC PEPTIDE - Abnormal; Notable for the following components:   B Natriuretic Peptide 233.2 (*)    All other components within normal limits  RESP PANEL BY RT-PCR (RSV, FLU A&B, COVID)  [id number]    EKG EKG Interpretation Date/Time:  Friday December 12 2023 22:19:28 EDT Ventricular Rate:  73 PR Interval:  166 QRS Duration:  72 QT Interval:  409 QTC Calculation: 451 R Axis:   -5  Text Interpretation: Sinus rhythm Consider anterior infarct when compared to prior, similar appearance. No STEMI Confirmed by Theda Belfast ([id number]) on 12/12/2023 10:26:06 PM  Radiology DG Chest Portable 1 View Result Date: 12/12/2023 CLINICAL DATA:  Flu, hypoxia EXAM: PORTABLE CHEST 1 VIEW COMPARISON:  12/16/2022 FINDINGS: Cardiomegaly. Low lung volumes. Bibasilar airspace opacities could reflect atelectasis or infiltrates. No effusions. Mild vascular congestion. No acute bony  abnormality. IMPRESSION: Cardiomegaly, vascular congestion. Low lung volumes with bibasilar opacities, favor atelectasis. Infiltrate/pneumonia cannot be completely excluded. Electronically Signed   By: Charlett Nose M.D.   On: 12/12/2023 22:07    Procedures Procedures    CRITICAL CARE Performed by: Canary Brim Laurent Cargile Total critical care time: 35 minutes Critical care time was exclusive of separately billable procedures and treating other patients. Critical care was necessary to treat or prevent imminent or life-threatening deterioration. Critical care was time spent personally by me on the following activities: development of treatment plan with patient and/or surrogate as well as nursing, discussions with consultants, evaluation of patient's response to treatment, examination of patient, obtaining history from patient or surrogate, ordering and performing treatments and interventions, ordering and review of laboratory studies, ordering and review of radiographic studies, pulse oximetry and re-evaluation of patient's condition.  Medications Ordered in ED Medications  sodium chloride 0.9 % bolus 500 mL (500 mLs Intravenous New Bag/Given 12/12/23 2221)    ED Course/ Medical Decision Making/ A&P                                 Medical Decision Making Amount and/or Complexity of Data Reviewed Labs: ordered. Radiology: ordered.  Risk Decision regarding hospitalization.    Trason Shifflet is a 73 y.o. male with a past medical history significant for diabetes, hypertension, hyperlipidemia, cirrhosis, previous diffuse large B-cell lymphoma, previous stroke with right hemiparesis, TIA, paroxysmal atrial fibrillation on Eliquis therapy, CHF, and recent diagnosis of influenza yesterday who presents for altered mental status and hypoxia.  According to EMS report to nursing, patient does not take oxygen normally and is now requiring 6 L nasal cannula to keep his oxygen in the 90s.  He has been  more fatigued and sleepy than normal but there was no reported fall.  He was reportedly only responsive to pain initially but now after being on oxygen during transport his mental status has improved.  Patient found to be febrile but was not tachycardic.  Patient is denying any chest pain or palpitations.  He does report some cough and the shortness of breath.  He denies abdominal pain, nausea, vomiting, constipation, or diarrhea.  Denies new leg pain or leg swelling.  On my exam, lungs have coarse breath sounds bilaterally.  Oxygen saturations were in the 80s before and are now in the 90s.  He is on 6 L.  Will keep him on this.  Chest nontender.  Abdomen nontender.  He has weakness in his right arm and right leg which he says is unchanged from baseline.  He did not have any notes of acute trauma.  Pupils are symmetric and reactive.  Patient does have a MOST form with him that says he would want antibiotics and some measures but he did not want to be intubated or CPR to be performed on him.  Due to this new hypoxia, he will need admission.  Will get chest x-ray to look for development of pneumonia and will get screening labs.  As I cannot see his positive influenza test in the chart, we will reswab him.  Will admit after labs are completed.  10:07 PM Workup continues to return.  Patient does have AKI but normal potassium.  Has a leukocytosis and normal hemoglobin.  BNP is elevated but improved from prior.  Will give a small amount of fluids for the AKI.  Chest x-ray does not show large pneumothorax but are waiting for x-ray reads officially.  Will call for admission for hypoxia and AKI in the setting of influenza.          Final Clinical Impression(s) / ED Diagnoses Final diagnoses:  Hypoxia  Shortness of breath  Acute cough  Influenza     Clinical Impression: 1. Hypoxia   2. Shortness of breath   3. Acute cough   4. Influenza     Disposition: Admit  This note was prepared  with assistance of Dragon voice recognition software. Occasional wrong-word or sound-a-like substitutions may have occurred due to the inherent limitations of voice recognition software.     Hoy Fallert, Canary Brim, MD 12/12/23 [phone]

## 2023-12-12 NOTE — H&P (Signed)
 History and Physical    Peter Ryan ZOX:[id number] DOB: [birth year] DOA: 12/12/2023  PCP: Nona Dell, NP  Patient coming from: SN[facility]  I have personally briefly reviewed patient's old medical records in [facility]  Chief Complaint: Altered mental status, hypoxia  HPI: Peter Ryan is a 73 y.o. male with medical history significant for chronic HFrEF (EF 25-30%), PAF on Eliquis, Hx of CVA with residual right hemiparesis and bedbound status, diffuse large B-cell lymphoma, T2DM, HTN, HLD, IgA MGUS, BPH who presented to the ED from SNF for evaluation of altered mental status and hypoxia.  Patient recently admitted at [facility] 2/13-2/20 for transient slurred speech and diagnosed with presumed TIA.  He was discharged to SNF.  Today at his SNF patient was noted to have new altered mental status.  He has not require supplemental oxygen at baseline but when EMS evaluated him he was hypoxic with SpO2 86% while on room air.  He was placed on 6 L O2 via [facility] to keep his SpO2 >92%.  Patient reportedly tested positive for influenza at his facility yesterday.  Patient's mentation seems to have improved from prior with improvement in his oxygenation.  He states that he has just been feeling generally unwell, fatigued, lethargic.  He has had a productive cough but has not been able to see what his sputum looks like.  He has had some shortness of breath.  He says that he has been making good urine without dysuria.  He has not noticed any swelling in his extremities as far as he can tell.  He has been feeling thirsty and dehydrated.  He denies any subjective fevers, chills, diaphoresis, chest pain.  Patient does acknowledge sometimes having issues with difficulty swallowing food/liquids but denies any recent aspiration events.  ED Course  Labs/Imaging on admission: I have personally reviewed following labs and imaging studies.  Initial vitals showed BP 124/76, pulse 75, RR 25, temp  100.2 F rectally, SpO2 100% on 6 L O2 via [facility] Ryan.  Labs show WBC 16.5, hemoglobin 13.8, platelets 197,000, sodium 141, potassium 4.0, bicarb 22, BUN 35, creatinine 1.77 (baseline ~0.8), serum glucose 160, LFTs within normal limits, BNP 233.2.  Portable chest x-ray showed cardiomegaly, vascular congestion, low lung volumes with bibasilar opacities.  Patient was given 500 cc normal saline.  The hospitalist service was consulted to admit for further evaluation and management.  Review of Systems: All systems reviewed and are negative except as documented in history of present illness above.   Past Medical History:  Diagnosis Date   Arthritis    BPH (benign prostatic hyperplasia)    Cancer (HCC)    Non-Hodgkins lymphoma   Chronic heart failure with reduced ejection fraction (HFrEF, <= 40%) (HCC)    Chronic prostatitis    Cirrhosis of liver (HCC)    Diabetes mellitus without complication (HCC)    Dyslipidemia    Elevated PSA    Gastric reflux    Gout    History of cerebrovascular accident (CVA) with residual deficit    HTN (hypertension)    Hyperlipidemia    Neuropathy    Over weight    Paroxysmal atrial fibrillation (HCC)    Psoriasis    Testicular pain, right    Urinary frequency     Past Surgical History:  Procedure Laterality Date   hydrocelectomy     IR FLUORO GUIDE PORT INSERTION RIGHT  04/11/2017   IR REMOVAL TUN ACCESS W/ PORT W/O FL MOD SED  05/29/2018  PILONIDAL CYST EXCISION     TEE WITHOUT CARDIOVERSION N/A 06/09/2020   Procedure: TRANSESOPHAGEAL ECHOCARDIOGRAM (TEE);  Surgeon: Antonieta Iba, MD;  Location: [facility] ORS;  Service: Cardiovascular;  Laterality: N/A;    Social History: Social History   Tobacco Use   Smoking status: Former    Current packs/day: 0.00    Average packs/day: 1.5 packs/day for 8.0 years (12.0 ttl pk-yrs)    Types: Cigarettes    Start date: 05/29/1966    Quit date: 05/29/1974    Years since quitting: 49.5   Smokeless tobacco: Never    Tobacco comments:    quit 40 years ago  Vaping Use   Vaping status: Never Used  Substance Use Topics   Alcohol use: No    Alcohol/week: 0.0 standard drinks of alcohol    Comment: rarely   Drug use: No    Allergies  Allergen Reactions   Hctz [Hydrochlorothiazide] Other (See Comments)    Gout flare   Metformin Other (See Comments)    Stomach upset     Family History  Problem Relation Age of Onset   Uterine cancer Mother    Diabetes Mother    Cancer Mother    Cancer Maternal Uncle    Bladder Cancer Neg Hx    Kidney disease Neg Hx    Prostate cancer Neg Hx      Prior to Admission medications   Medication Sig Start Date End Date Taking? Authorizing Provider  acetaminophen (TYLENOL) 325 MG tablet Take 2 tablets (650 mg total) by mouth every 4 (four) hours as needed for mild pain (or temp > 37.5 C (99.5 F)). 07/10/20   Angiulli, Mcarthur Rossetti, PA-C  allopurinol (ZYLOPRIM) 300 MG tablet TAKE ONE TABLET EVERY DAY 06/11/21   Bosie Clos, MD  apixaban (ELIQUIS) 5 MG TABS tablet Take 1 tablet (5 mg total) by mouth 2 (two) times daily. 12/23/22 01/22/23  Tresa Moore, MD  atorvastatin (LIPITOR) 80 MG tablet TAKE ONE TABLET (80 MG) BY MOUTH EVERY DAY 07/09/21   Bosie Clos, MD  BOUDREAUXS BUTT PASTE 40 % ointment Apply 1 application. topically 2 (two) times daily. 10/23/21   [provider]  carvedilol (COREG) 6.25 MG tablet Take 1 tablet (6.25 mg total) by mouth 2 (two) times daily with a meal. Patient not taking: Reported on 11/14/2023 12/23/22 01/22/23  Tresa Moore, MD  Cholecalciferol (VITAMIN D3) 25 MCG (1000 UT) CAPS Take 1 capsule (1,000 Units total) by mouth daily. 07/10/20   Angiulli, Mcarthur Rossetti, PA-C  ferrous sulfate 325 (65 FE) MG tablet Take 1 tablet (325 mg total) by mouth every other day. Patient taking differently: Take 325 mg by mouth every other day. Mon/wed/friday 02/09/22 12/16/22  Lurene Shadow, MD  finasteride (PROSCAR) 5 MG tablet Take 1  tablet (5 mg total) by mouth daily. 03/01/21   Michiel Cowboy A, PA-C  furosemide (LASIX) 40 MG tablet Take 1 tablet (40 mg total) by mouth daily. 12/23/22 11/14/23  Tresa Moore, MD  hydrOXYzine (ATARAX) 10 MG tablet Take 1 tablet (10 mg total) by mouth 3 (three) times daily as needed for itching. 11/20/23   Arnetha Courser, MD  insulin aspart (NOVOLOG) 100 UNIT/ML FlexPen Inject 5 Units into the skin 3 (three) times daily with meals. 11/20/23   Arnetha Courser, MD  insulin glargine-yfgn (SEMGLEE) 100 UNIT/ML injection Inject 0.15 mLs (15 Units total) into the skin daily. 11/21/23   Arnetha Courser, MD  polyethylene glycol (MIRALAX / GLYCOLAX) 17 g  packet Take 17 g by mouth daily as needed. 02/09/22   Lurene Shadow, MD  potassium chloride SA (KLOR-CON M) 20 MEQ tablet Take 1 tablet (20 mEq total) by mouth daily. 11/20/23   Arnetha Courser, MD  spironolactone (ALDACTONE) 25 MG tablet Take 1 tablet (25 mg total) by mouth daily. 12/23/22 11/14/23  Tresa Moore, MD  valsartan (DIOVAN) 40 MG tablet Take 40 mg by mouth 2 (two) times daily.    [provider]  vitamin B-12 (CYANOCOBALAMIN) 1000 MCG tablet Take 1 tablet (1,000 mcg total) by mouth daily. Patient not taking: Reported on 11/14/2023 09/04/20   Bosie Clos, MD    Physical Exam: Vitals:   12/12/23 2021 12/12/23 2100 12/12/23 2130 12/12/23 2200  BP:  121/77 115/75 (!) 141/77  Pulse:  74 73 74  Resp:  (!) 25 20 (!) 24  Temp:      TempSrc:      SpO2:  100% 100% 100%  Weight: 113.4 kg     Height: 5\' 10"  (1.778 m)      Constitutional: Chronically ill-appearing man resting in bed.  Appears fatigued but in NAD. Eyes: EOMI, lids and conjunctivae normal ENMT: Mucous membranes are dry. Posterior pharynx clear of any exudate or lesions.Normal dentition.  Neck: normal, supple, no masses. Respiratory: Coarse upper airway sounds anteriorly.  Unable to listen posteriorly due to poor mobility. Normal respiratory effort while on 6 L O2  [facility]. No accessory muscle use.  Cardiovascular: Regular rate and rhythm, no murmurs / rubs / gallops. No extremity edema. 2+ pedal pulses. Abdomen: no tenderness, no masses palpated. Musculoskeletal: Right hemiparesis.  ROM LUE and LLE intact Skin: no rashes, lesions, ulcers. No induration Neurologic: Sensation intact. Strength 0/5 RUE and RLE, 5/5 LUE and LLE. Psychiatric: Alert and oriented x 3. Normal mood.   EKG: Personally reviewed. Sinus rhythm, rate 73, no acute ischemic changes.  Similar to previous.  Assessment/Plan Active Problems:   Acute respiratory failure with hypoxia (HCC)   AKI (acute kidney injury) (HCC)   Chronic heart failure with reduced ejection fraction (HFrEF, <= 40%) (HCC)   Paroxysmal atrial fibrillation (HCC)   History of cerebrovascular accident (CVA) with residual deficit   BPH with obstruction/lower urinary tract symptoms   Type 2 diabetes mellitus (HCC)   Hyperlipidemia associated with type 2 diabetes mellitus (HCC)   Hypertension associated with diabetes (HCC)   Alcides Nutting is a 73 y.o. 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  Assessment and Plan: Acute hypoxic respiratory failure due to influenza A with pneumonia: Patient reportedly tested positive for influenza at his SNF day prior to admission, repeat test is pending.  He developed new hypoxia with SpO2 86% on RA PTA, requiring 6 L supplemental O2 via [facility] on admission.  CXR with cardiomegaly, vascular congestion, bibasilar opacities.  Overall does not really appear volume overloaded though more likely hypovolemic.  He is at risk for secondary bacterial infection given recent hospitalization and immobility.  Given his leukocytosis we will cover  empirically with antibiotics. -Start renal dose Tamiflu -Start empiric antibiotics with ceftriaxone and doxycycline -IV Solu-Medrol 40 mg twice daily -Scheduled Brovana/Pulmicort, DuoNebs as needed -Continue supplemental oxygen and wean as able  Acute kidney injury: Creatinine 1.77 on admission compared to baseline  around 0.8.  Overall appears hypovolemic in setting of influenza. -Obtain urine studies -Gentle IV fluid hydration overnight -Holding valsartan, spironolactone, Lasix -Monitor UOP, bladder scan and [facility] Ryan cath as needed  Chronic HFrEF: Appears hypovolemic as above.  Receiving gentle IV fluid hydration.  Holding ARB, spironolactone, and Lasix.  Monitor strict I/O's and daily weights.  Paroxysmal atrial fibrillation: Stable from sinus rhythm on admission.  Continue Coreg and Eliquis.  History of CVA with residual right hemiparesis: Chronic issue with bedbound status.  Continue Eliquis and atorvastatin.  Type 2 diabetes: Last hemoglobin A1c 8.2%.  Placed on SSI and Semglee 8 units daily.  Hypertension: BP is currently stable.  Continue Coreg.  Holding spironolactone and valsartan as above.  Hyperlipidemia: Continue atorvastatin.  BPH: Continue Proscar.  History of diffuse large B-cell lymphoma and IgA MGUS: Not on active treatment, no acute issues.   DVT prophylaxis:  apixaban (ELIQUIS) tablet 5 mg   Code Status:   Code Status: Do not attempt resuscitation (DNR) - Comfort care MOST form at bedside.  Confirmed with patient on admission. Family Communication: None present on admission Disposition Plan: From SNF and likely return to SNF pending clinical progress Consults called: None Severity of Illness: The appropriate patient status for this patient is INPATIENT. Inpatient status is judged to be reasonable and necessary in order to provide the required intensity of service to ensure the patient's safety. The patient's presenting symptoms, physical exam findings,  and initial radiographic and laboratory data in the context of their chronic comorbidities is felt to place them at high risk for further clinical deterioration. Furthermore, it is not anticipated that the patient will be medically stable for discharge from the hospital within 2 midnights of admission.   * I certify that at the point of admission it is my clinical judgment that the patient will require inpatient hospital care spanning beyond 2 midnights from the point of admission due to high intensity of service, high risk for further deterioration and high frequency of surveillance required.Darreld Mclean MD [facility]  If 7PM-7AM, please contact night-coverage [email or web address]  12/13/2023, 12:04 AM

## 2023-12-12 NOTE — ED Triage Notes (Addendum)
 Pt BIB [facility] from [facility], was diagnosed with the flu yesterday. Unresponsive upon EMS arrival, now responsive to pain. Hx of a stroke, hemiplegia from stroke. AMS at baseline but usually able to answer questions for the facility.   EMS 100.6T, 86% RA, 85P, 150/80BP, CBG 155 6L [place] 95%.

## 2023-12-13 DIAGNOSIS — I480 Paroxysmal atrial fibrillation: Secondary | ICD-10-CM

## 2023-12-13 DIAGNOSIS — E1165 Type 2 diabetes mellitus with hyperglycemia: Secondary | ICD-10-CM | POA: Diagnosis not present

## 2023-12-13 DIAGNOSIS — N138 Other obstructive and reflux uropathy: Secondary | ICD-10-CM

## 2023-12-13 DIAGNOSIS — E785 Hyperlipidemia, unspecified: Secondary | ICD-10-CM

## 2023-12-13 DIAGNOSIS — N179 Acute kidney failure, unspecified: Secondary | ICD-10-CM | POA: Diagnosis not present

## 2023-12-13 DIAGNOSIS — I152 Hypertension secondary to endocrine disorders: Secondary | ICD-10-CM

## 2023-12-13 DIAGNOSIS — I5022 Chronic systolic (congestive) heart failure: Secondary | ICD-10-CM

## 2023-12-13 DIAGNOSIS — N401 Enlarged prostate with lower urinary tract symptoms: Secondary | ICD-10-CM

## 2023-12-13 DIAGNOSIS — E1159 Type 2 diabetes mellitus with other circulatory complications: Secondary | ICD-10-CM

## 2023-12-13 DIAGNOSIS — I6930 Unspecified sequelae of cerebral infarction: Secondary | ICD-10-CM

## 2023-12-13 DIAGNOSIS — E1169 Type 2 diabetes mellitus with other specified complication: Secondary | ICD-10-CM

## 2023-12-13 DIAGNOSIS — Z794 Long term (current) use of insulin: Secondary | ICD-10-CM

## 2023-12-13 DIAGNOSIS — J9601 Acute respiratory failure with hypoxia: Secondary | ICD-10-CM | POA: Diagnosis not present

## 2023-12-13 LAB — GLUCOSE, CAPILLARY
Glucose-Capillary: 264 mg/dL — ABNORMAL HIGH (ref 70–99)
Glucose-Capillary: 193 mg/dL — ABNORMAL HIGH (ref 70–99)

## 2023-12-13 LAB — CBC
HCT: 42.3 % (ref 39.0–52.0)
Hemoglobin: 13.6 g/dL (ref 13.0–17.0)
MCH: 28.9 pg (ref 26.0–34.0)
MCHC: 32.2 g/dL (ref 30.0–36.0)
MCV: 89.8 fL (ref 80.0–100.0)
Platelets: 190 10*3/uL (ref 150–400)
RBC: 4.71 MIL/uL (ref 4.22–5.81)
RDW: 15.9 % — ABNORMAL HIGH (ref 11.5–15.5)
WBC: 16.1 10*3/uL — ABNORMAL HIGH (ref 4.0–10.5)
nRBC: 0 % (ref 0.0–0.2)

## 2023-12-13 LAB — BASIC METABOLIC PANEL
Anion gap: 10 (ref 5–15)
BUN: 37 mg/dL — ABNORMAL HIGH (ref 8–23)
CO2: 23 mmol/L (ref 22–32)
Calcium: 8.5 mg/dL — ABNORMAL LOW (ref 8.9–10.3)
Chloride: 109 mmol/L (ref 98–111)
Creatinine, Ser: 1.5 mg/dL — ABNORMAL HIGH (ref 0.61–1.24)
GFR, Estimated: 50 mL/min — ABNORMAL LOW (ref >60)
Glucose, Bld: 195 mg/dL — ABNORMAL HIGH (ref 70–99)
Potassium: 4.2 mmol/L (ref 3.5–5.1)
Sodium: 142 mmol/L (ref 135–145)

## 2023-12-13 LAB — CBG MONITORING, ED
Glucose-Capillary: 148 mg/dL — ABNORMAL HIGH (ref 70–99)
Glucose-Capillary: 223 mg/dL — ABNORMAL HIGH (ref 70–99)
Glucose-Capillary: 314 mg/dL — ABNORMAL HIGH (ref 70–99)

## 2023-12-13 LAB — RESP PANEL BY RT-PCR (RSV, FLU A&B, COVID)  RVPGX2
Influenza A by PCR: POSITIVE — AB
Influenza B by PCR: NEGATIVE
Resp Syncytial Virus by PCR: NEGATIVE
SARS Coronavirus 2 by RT PCR: NEGATIVE

## 2023-12-13 LAB — PROCALCITONIN: Procalcitonin: 0.34 ng/mL

## 2023-12-13 LAB — HIV ANTIBODY (ROUTINE TESTING W REFLEX): HIV Screen 4th Generation wRfx: NONREACTIVE

## 2023-12-13 MED ORDER — METHYLPREDNISOLONE SODIUM SUCC 40 MG IJ SOLR
40.0000 mg | INTRAMUSCULAR | Status: DC
  Administered 2023-12-13 – 2023-12-15 (×3): 40 mg via INTRAVENOUS
  Filled 2023-12-13 (×3): qty 1

## 2023-12-13 MED ORDER — INSULIN ASPART 100 UNIT/ML IJ SOLN
0.0000 [IU] | Freq: Three times a day (TID) | INTRAMUSCULAR | Status: DC
  Administered 2023-12-13: 8 [IU] via SUBCUTANEOUS
  Administered 2023-12-13: 11 [IU] via SUBCUTANEOUS
  Administered 2023-12-14 – 2023-12-15 (×4): 8 [IU] via SUBCUTANEOUS
  Administered 2023-12-15: 11 [IU] via SUBCUTANEOUS
  Administered 2023-12-15: 5 [IU] via SUBCUTANEOUS
  Administered 2023-12-16 (×2): 11 [IU] via SUBCUTANEOUS
  Administered 2023-12-16 – 2023-12-17 (×2): 5 [IU] via SUBCUTANEOUS
  Administered 2023-12-17 (×2): 8 [IU] via SUBCUTANEOUS
  Administered 2023-12-18: 3 [IU] via SUBCUTANEOUS

## 2023-12-13 MED ORDER — INSULIN ASPART 100 UNIT/ML IJ SOLN
0.0000 [IU] | Freq: Every day | INTRAMUSCULAR | Status: DC
  Administered 2023-12-14: 3 [IU] via SUBCUTANEOUS
  Administered 2023-12-15: 2 [IU] via SUBCUTANEOUS
  Administered 2023-12-16: 3 [IU] via SUBCUTANEOUS

## 2023-12-13 MED ORDER — INSULIN GLARGINE-YFGN 100 UNIT/ML ~~LOC~~ SOLN: 15.0000 [IU] | Freq: Every day | SUBCUTANEOUS | Status: DC

## 2023-12-13 MED ORDER — INSULIN ASPART 100 UNIT/ML IJ SOLN
0.0000 [IU] | Freq: Three times a day (TID) | INTRAMUSCULAR | Status: DC
  Administered 2023-12-13: 3 [IU] via SUBCUTANEOUS

## 2023-12-13 MED ORDER — ARFORMOTEROL TARTRATE 15 MCG/2ML IN NEBU
15.0000 ug | INHALATION_SOLUTION | Freq: Two times a day (BID) | RESPIRATORY_TRACT | Status: DC
  Administered 2023-12-13: 15 ug via RESPIRATORY_TRACT
  Filled 2023-12-13: qty 2

## 2023-12-13 MED ORDER — IPRATROPIUM-ALBUTEROL 0.5-2.5 (3) MG/3ML IN SOLN: 3.0000 mL | Freq: Four times a day (QID) | RESPIRATORY_TRACT | Status: DC | PRN

## 2023-12-13 MED ORDER — INSULIN ASPART 100 UNIT/ML IJ SOLN
3.0000 [IU] | Freq: Three times a day (TID) | INTRAMUSCULAR | Status: DC
  Administered 2023-12-13 – 2023-12-18 (×6): 3 [IU] via SUBCUTANEOUS

## 2023-12-13 MED ORDER — INSULIN GLARGINE 100 UNIT/ML ~~LOC~~ SOLN
15.0000 [IU] | Freq: Every day | SUBCUTANEOUS | Status: DC
  Administered 2023-12-13 – 2023-12-17 (×5): 15 [IU] via SUBCUTANEOUS
  Filled 2023-12-13 (×6): qty 0.15

## 2023-12-13 MED ORDER — INSULIN GLARGINE 100 UNIT/ML ~~LOC~~ SOLN
8.0000 [IU] | Freq: Every day | SUBCUTANEOUS | Status: DC
  Filled 2023-12-13: qty 0.08

## 2023-12-13 MED ORDER — BUDESONIDE 0.25 MG/2ML IN SUSP
0.2500 mg | Freq: Two times a day (BID) | RESPIRATORY_TRACT | Status: DC
  Administered 2023-12-13: 0.25 mg via RESPIRATORY_TRACT
  Filled 2023-12-13: qty 2

## 2023-12-13 NOTE — ED Notes (Signed)
 Admitting MD Alanda Slim at bedside

## 2023-12-13 NOTE — ED Notes (Signed)
 Patient cleaned and changed. New brief applied.

## 2023-12-13 NOTE — Plan of Care (Signed)

## 2023-12-13 NOTE — ED Notes (Signed)
 Spouse number, pls give update [phone]

## 2023-12-13 NOTE — Progress Notes (Signed)
 IS initiated by RT. Patient requires assistance due to hemiparesis. Good effort noted on IS X10  achieving or >

## 2023-12-13 NOTE — ED Notes (Signed)
 Wife, [name], was updated on patient's status.

## 2023-12-13 NOTE — Progress Notes (Signed)
 Received report from ED RN that patient has been coughing a lot after drinking. Notified Dr. Alanda Slim regarding this and request for Speech evaluation.

## 2023-12-13 NOTE — Progress Notes (Signed)
 PROGRESS NOTE  Peter Ryan UJW:[id number] DOB: May 14, 1951   PCP: Nona Dell, NP  Patient is from: SNF.  Bedbound at baseline.  DOA: 12/12/2023 LOS: 1  Chief complaints Chief Complaint  Patient presents with   Fever   Altered Mental Status     Brief Narrative / Interim history: 73 year old M with PMH of CVA with right hemiparesis and bedbound, HFrEF, PAF on Eliquis, DM-2, diffuse large B-cell lymphoma, IgA MGUS, BPH, HTN, HLD and obesity brought to ED from SNF due to altered mental status, hypoxemia, productive cough and fatigue, and admitted with working diagnosis of acute respiratory failure with hypoxia due to influenza A infection, bacterial pneumonia and AKI.  Reportedly saturating at 86% on RA when EMS arrived and started on 6 L.  He tested positive for influenza at the day prior, and started on Tamiflu.  He had mild temp 200.2 in ED.  WBC 16.5 with left shift. Cr 1.77 (baseline 0.8).  BNP 233.  CXR with cardiomegaly, vascular congestion and bibasilar opacities.  Patient appeared dry on presentation.  Started on IV fluid, Tamiflu, ceftriaxone and doxycycline and admitted.  Subjective: Seen and examined earlier this morning.  No major events overnight of this morning.  Reports a wet cough but not able to cough up the phlegm.  Denies chest pain or shortness of breath.  Denies GI or UTI symptoms.  Objective: Vitals:   12/13/23 0500 12/13/23 0800 12/13/23 0834 12/13/23 0900  BP: 131/81 (!) 154/96  (!) 143/84  Pulse: 78 80  88  Resp: (!) 22 19  (!) 22  Temp:   98.4 F (36.9 C)   TempSrc:   Axillary   SpO2: 95% 96%  91%  Weight:      Height:        Examination:  GENERAL: No apparent distress.  Nontoxic. HEENT: MMM.  Vision and hearing grossly intact.  NECK: Supple.  No apparent JVD.  RESP:  No IWOB.  Fair aeration bilaterally.  Rhonchi bilaterally. CVS:  RRR. Heart sounds normal.  ABD/GI/GU: BS+. Abd soft, NTND.  MSK/EXT:  Moves extremities. No apparent  deformity. No edema.  SKIN: no apparent skin lesion or wound NEURO: Awake, alert and oriented appropriately.  Right hemiparesis. PSYCH: Calm. Normal affect.   Consultants:  None  Procedures: None  Microbiology summarized: Influenza A PCR positive COVID-19, influenza B and RSV PCR nonreactive  Assessment and plan: Acute hypoxic respiratory failure due to influenza A with pneumonia: Tested positive at SNF and started on Tamiflu the day prior.  Desaturated to 86% when EMS arrived and started on 6 L.  Not on oxygen at baseline.  CXR suggested cardiomegaly, vascular congestion and bibasilar opacities but appear dry on presentation. -Continue Tamiflu, ceftriaxone and doxycycline -Decrease IV Solu-Medrol to 40 mg daily -Discontinue Brovana and Pulmicort.  Continue DuoNebs as needed for now. -Wean oxygen as able.  Incentive spirometry -Mucolytic's. -Discontinue IV fluid -Check procalcitonin.   Acute kidney injury: Baseline Cr ~0.7-0.8.  Likely due to poor p.o. intake in the setting of acute illness and concurrent use of diuretics/ARB. Recent Labs    12/19/22 0631 12/20/22 0625 12/21/22 0758 12/22/22 0744 11/13/23 1225 11/16/23 1001 11/17/23 0917 11/18/23 1005 12/12/23 2028 12/13/23 0337  BUN 24* 23 27* 23 10 15 17 20  35* 37*  CREATININE 0.94 0.82 0.87 0.82 0.69 0.76 0.83 0.89 1.77* 1.50*  -Monitor off IV fluid given underlying CHF -Strict intake and output -Avoid nephrotoxic meds-hold home Lasix, Aldactone and Diovan.    Chronic  HFrEF: TTE in 11/2023 with LVEF of 25 to 30%, GH, G1-DD, and normal RVSP Appears hypovolemic as above.  Receiving gentle IV fluid hydration.  Holding ARB, spironolactone, and Lasix.  Monitor strict I/O's and daily weights.  BNP was 233.  CXR with cardiomegaly, vascular congestion and bibasilar opacity but patient appears dry with AKI on presentation.  Hydration and AKI improved with IV fluid. -Continue holding diuretics and ARB -Continue home  Coreg -Strict intake and output, daily weight, renal functions and electrolytes   Paroxysmal A-fib: Currently in sinus rhythm. -Continue Coreg and Eliquis -Optimize electrolytes   History of CVA with residual right hemiparesis and bedbound -Continue Eliquis and Lipitor.   IDDM-2 with hyperglycemia: A1c 8.2%.  Hyperglycemia probably due to steroid. Recent Labs  Lab 12/13/23 0104 12/13/23 0754  GLUCAP 148* 223*  -Increase SSI to moderate -Added NovoLog 3 units 3 times daily with meals -Add Semglee 15 units daily -Further adjustment as appropriate   Essential hypertension: BP within acceptable range -Continue Coreg -Continue holding Lasix, spironolactone and valsartan as above.   Hyperlipidemia: -Continue atorvastatin.   BPH: -Continue Proscar.   History of diffuse large B-cell lymphoma and IgA MGUS: Not on active treatment, no acute issues.  Morbid obesity: Elevated BMI with comorbidity as above. Body mass index is 35.87 kg/m.          DVT prophylaxis:   apixaban (ELIQUIS) tablet 5 mg  Code Status: DNR/DNI Family Communication: Updated patient's wife over the phone. Level of care: Telemetry Cardiac Status is: Inpatient Remains inpatient appropriate because: Acute respiratory failure with hypoxia due to influenza A infection and bacterial infection   Final disposition: Back to SNF once medically stable.   55 minutes with more than 50% spent in reviewing records, counseling patient/family and coordinating care.   Sch Meds:  Scheduled Meds:  apixaban  5 mg Oral BID   atorvastatin  80 mg Oral Daily   carvedilol  6.25 mg Oral BID WC   doxycycline  100 mg Oral Q12H   finasteride  5 mg Oral Daily   guaiFENesin  600 mg Oral BID   insulin aspart  0-15 Units Subcutaneous TID WC   insulin aspart  0-5 Units Subcutaneous QHS   insulin aspart  3 Units Subcutaneous TID WC   insulin glargine-yfgn  15 Units Subcutaneous Daily   [START ON 12/14/2023]  methylPREDNISolone (SOLU-MEDROL) injection  40 mg Intravenous Q24H   oseltamivir  30 mg Oral BID   sodium chloride flush  3 mL Intravenous Q12H   Continuous Infusions:  cefTRIAXone (ROCEPHIN)  IV Stopped (12/13/23 0051)   PRN Meds:.acetaminophen **OR** acetaminophen, bisacodyl, hydrOXYzine, ipratropium-albuterol, prochlorperazine, senna-docusate  Antimicrobials: Anti-infectives (From admission, onward)    Start     Dose/Rate Route Frequency Ordered Stop   12/13/23 1000  oseltamivir (TAMIFLU) capsule 30 mg       Placed in "Followed by" Linked Group   30 mg Oral 2 times daily 12/12/23 2359 12/17/23 0959   12/13/23 0000  cefTRIAXone (ROCEPHIN) 2 g in sodium chloride 0.9 % 100 mL IVPB        2 g 200 mL/hr over 30 Minutes Intravenous Every 24 hours 12/12/23 2350 12/17/23 2359   12/13/23 0000  doxycycline (VIBRA-TABS) tablet 100 mg        100 mg Oral Every 12 hours 12/12/23 2350     12/13/23 0000  oseltamivir (TAMIFLU) capsule 75 mg       Placed in "Followed by" Linked Group   75 mg Oral  Once 12/12/23 2359 12/13/23 0100        I have personally reviewed the following labs and images: CBC: Recent Labs  Lab 12/12/23 2028 12/13/23 0337  WBC 16.5* 16.1*  NEUTROABS 13.3*  --   HGB 13.8 13.6  HCT 43.0 42.3  MCV 89.8 89.8  PLT 197 190   BMP &GFR Recent Labs  Lab 12/12/23 2028 12/13/23 0337  NA 141 142  K 4.0 4.2  CL 109 109  CO2 22 23  GLUCOSE 160* 195*  BUN 35* 37*  CREATININE 1.77* 1.50*  CALCIUM 8.8* 8.5*   Estimated Creatinine Clearance: 57.8 mL/min (A) (by C-G formula based on SCr of 1.5 mg/dL (H)). Liver & Pancreas: Recent Labs  Lab 12/12/23 2028  AST 25  ALT 22  ALKPHOS 61  BILITOT 0.9  PROT 7.6  ALBUMIN 3.3*   No results for input(s): "LIPASE", "AMYLASE" in the last 168 hours. No results for input(s): "AMMONIA" in the last 168 hours. Diabetic: No results for input(s): "HGBA1C" in the last 72 hours. Recent Labs  Lab 12/13/23 0104 12/13/23 0754   GLUCAP 148* 223*   Cardiac Enzymes: No results for input(s): "CKTOTAL", "CKMB", "CKMBINDEX", "TROPONINI" in the last 168 hours. No results for input(s): "PROBNP" in the last 8760 hours. Coagulation Profile: No results for input(s): "INR", "PROTIME" in the last 168 hours. Thyroid Function Tests: No results for input(s): "TSH", "T4TOTAL", "FREET4", "T3FREE", "THYROIDAB" in the last 72 hours. Lipid Profile: No results for input(s): "CHOL", "HDL", "LDLCALC", "TRIG", "CHOLHDL", "LDLDIRECT" in the last 72 hours. Anemia Panel: No results for input(s): "VITAMINB12", "FOLATE", "FERRITIN", "TIBC", "IRON", "RETICCTPCT" in the last 72 hours. Urine analysis:    Component Value Date/Time   COLORURINE YELLOW (A) 12/18/2022 0725   APPEARANCEUR HAZY (A) 12/18/2022 0725   APPEARANCEUR Clear 07/18/2016 0927   LABSPEC 1.024 12/18/2022 0725   PHURINE 5.0 12/18/2022 0725   GLUCOSEU NEGATIVE 12/18/2022 0725   HGBUR NEGATIVE 12/18/2022 0725   BILIRUBINUR NEGATIVE 12/18/2022 0725   BILIRUBINUR negative 09/07/2020 1505   BILIRUBINUR Negative 07/18/2016 0927   KETONESUR NEGATIVE 12/18/2022 0725   PROTEINUR 30 (A) 12/18/2022 0725   UROBILINOGEN 0.2 09/07/2020 1505   NITRITE NEGATIVE 12/18/2022 0725   LEUKOCYTESUR NEGATIVE 12/18/2022 0725   Sepsis Labs: Invalid input(s): "PROCALCITONIN", "LACTICIDVEN"  Microbiology: Recent Results (from the past 240 hours)  Resp panel by RT-PCR (RSV, Flu A&B, Covid) Anterior Nasal Swab     Status: Abnormal   Collection Time: 12/12/23 10:23 PM   Specimen: Anterior Nasal Swab  Result Value Ref Range Status   SARS Coronavirus 2 by RT PCR NEGATIVE NEGATIVE Final   Influenza A by PCR POSITIVE (A) NEGATIVE Final   Influenza B by PCR NEGATIVE NEGATIVE Final    Comment: (NOTE) The Xpert Xpress SARS-CoV-2/FLU/RSV plus assay is intended as an aid in the diagnosis of influenza from Nasopharyngeal swab specimens and should not be used as a sole basis for treatment. Nasal  washings and aspirates are unacceptable for Xpert Xpress SARS-CoV-2/FLU/RSV testing.  Fact Sheet for Patients: BloggerCourse.com  Fact Sheet for Healthcare Providers: SeriousBroker.it  This test is not yet approved or cleared by the [place] FDA and has been authorized for detection and/or diagnosis of SARS-CoV-2 by FDA under an Emergency Use Authorization (EUA). This EUA will remain in effect (meaning this test can be used) for the duration of the COVID-19 declaration under Section 564(b)(1) of the Act, 21 U.S.C. section 360bbb-3(b)(1), unless the authorization is terminated or revoked.  Resp Syncytial Virus by PCR NEGATIVE NEGATIVE Final    Comment: (NOTE) Fact Sheet for Patients: BloggerCourse.com  Fact Sheet for Healthcare Providers: SeriousBroker.it  This test is not yet approved or cleared by the [place] FDA and has been authorized for detection and/or diagnosis of SARS-CoV-2 by FDA under an Emergency Use Authorization (EUA). This EUA will remain in effect (meaning this test can be used) for the duration of the COVID-19 declaration under Section 564(b)(1) of the Act, 21 U.S.C. section 360bbb-3(b)(1), unless the authorization is terminated or revoked.  Performed at [facility], [address] [place], [place] [address]     Radiology Studies: DG Chest Portable 1 View Result Date: 12/12/2023 CLINICAL DATA:  Flu, hypoxia EXAM: PORTABLE CHEST 1 VIEW COMPARISON:  12/16/2022 FINDINGS: Cardiomegaly. Low lung volumes. Bibasilar airspace opacities could reflect atelectasis or infiltrates. No effusions. Mild vascular congestion. No acute bony abnormality. IMPRESSION: Cardiomegaly, vascular congestion. Low lung volumes with bibasilar opacities, favor atelectasis. Infiltrate/pneumonia cannot be completely excluded. Electronically Signed   By: Charlett Nose M.D.    On: 12/12/2023 22:07      Emmalyn Hinson T. Laelle Bridgett [facility]  If 7PM-7AM, please contact night-coverage [email or web address] 12/13/2023, 10:54 AM

## 2023-12-13 NOTE — ED Notes (Signed)
 This paramedic assisted patient with breakfast.

## 2023-12-14 DIAGNOSIS — J111 Influenza due to unidentified influenza virus with other respiratory manifestations: Secondary | ICD-10-CM | POA: Diagnosis not present

## 2023-12-14 DIAGNOSIS — E1165 Type 2 diabetes mellitus with hyperglycemia: Secondary | ICD-10-CM | POA: Diagnosis not present

## 2023-12-14 DIAGNOSIS — I5022 Chronic systolic (congestive) heart failure: Secondary | ICD-10-CM | POA: Diagnosis not present

## 2023-12-14 DIAGNOSIS — J9601 Acute respiratory failure with hypoxia: Secondary | ICD-10-CM | POA: Diagnosis not present

## 2023-12-14 LAB — RENAL FUNCTION PANEL
Albumin: 3 g/dL — ABNORMAL LOW (ref 3.5–5.0)
Anion gap: 19 — ABNORMAL HIGH (ref 5–15)
BUN: 45 mg/dL — ABNORMAL HIGH (ref 8–23)
CO2: 17 mmol/L — ABNORMAL LOW (ref 22–32)
Calcium: 9.1 mg/dL (ref 8.9–10.3)
Chloride: 107 mmol/L (ref 98–111)
Creatinine, Ser: 1.22 mg/dL (ref 0.61–1.24)
GFR, Estimated: >60 mL/min (ref >60)
Glucose, Bld: 239 mg/dL — ABNORMAL HIGH (ref 70–99)
Phosphorus: 3 mg/dL (ref 2.5–4.6)
Potassium: 4.2 mmol/L (ref 3.5–5.1)
Sodium: 143 mmol/L (ref 135–145)

## 2023-12-14 LAB — URINALYSIS, ROUTINE W REFLEX MICROSCOPIC
Bilirubin Urine: NEGATIVE
Glucose, UA: NEGATIVE mg/dL
Hgb urine dipstick: NEGATIVE
Ketones, ur: NEGATIVE mg/dL
Leukocytes,Ua: NEGATIVE
Nitrite: NEGATIVE
Protein, ur: NEGATIVE mg/dL
Specific Gravity, Urine: 1.02 (ref 1.005–1.030)
pH: 5 (ref 5.0–8.0)

## 2023-12-14 LAB — CBC
HCT: 41.2 % (ref 39.0–52.0)
Hemoglobin: 13.4 g/dL (ref 13.0–17.0)
MCH: 28.9 pg (ref 26.0–34.0)
MCHC: 32.5 g/dL (ref 30.0–36.0)
MCV: 89 fL (ref 80.0–100.0)
Platelets: 199 10*3/uL (ref 150–400)
RBC: 4.63 MIL/uL (ref 4.22–5.81)
RDW: 15.7 % — ABNORMAL HIGH (ref 11.5–15.5)
WBC: 16.9 10*3/uL — ABNORMAL HIGH (ref 4.0–10.5)
nRBC: 0 % (ref 0.0–0.2)

## 2023-12-14 LAB — GLUCOSE, CAPILLARY
Glucose-Capillary: 263 mg/dL — ABNORMAL HIGH (ref 70–99)
Glucose-Capillary: 300 mg/dL — ABNORMAL HIGH (ref 70–99)
Glucose-Capillary: 283 mg/dL — ABNORMAL HIGH (ref 70–99)
Glucose-Capillary: 270 mg/dL — ABNORMAL HIGH (ref 70–99)

## 2023-12-14 LAB — MAGNESIUM: Magnesium: 1.7 mg/dL (ref 1.7–2.4)

## 2023-12-14 LAB — SODIUM, URINE, RANDOM: Sodium, Ur: 27 mmol/L

## 2023-12-14 LAB — CREATININE, URINE, RANDOM: Creatinine, Urine: 148 mg/dL

## 2023-12-14 MED ORDER — METOPROLOL TARTRATE 5 MG/5ML IV SOLN
2.5000 mg | INTRAVENOUS | Status: AC | PRN
Start: 2023-12-14 — End: 2023-12-14
  Administered 2023-12-14 (×2): 2.5 mg via INTRAVENOUS
  Filled 2023-12-14: qty 5

## 2023-12-14 MED ORDER — MAGNESIUM SULFATE IN D5W 1-5 GM/100ML-% IV SOLN
1.0000 g | Freq: Once | INTRAVENOUS | Status: AC
  Administered 2023-12-14: 1 g via INTRAVENOUS
  Filled 2023-12-14: qty 100

## 2023-12-14 MED ORDER — DILTIAZEM LOAD VIA INFUSION
20.0000 mg | Freq: Once | INTRAVENOUS | Status: AC
  Administered 2023-12-14: 20 mg via INTRAVENOUS
  Filled 2023-12-14: qty 20

## 2023-12-14 MED ORDER — INSULIN GLARGINE 100 UNIT/ML ~~LOC~~ SOLN
8.0000 [IU] | Freq: Every day | SUBCUTANEOUS | Status: DC
  Administered 2023-12-14: 8 [IU] via SUBCUTANEOUS
  Filled 2023-12-14 (×2): qty 0.08

## 2023-12-14 MED ORDER — DILTIAZEM HCL-DEXTROSE 125-5 MG/125ML-% IV SOLN (PREMIX)
5.0000 mg/h | INTRAVENOUS | Status: AC
  Administered 2023-12-14 – 2023-12-15 (×2): 5 mg/h via INTRAVENOUS
  Filled 2023-12-14 (×4): qty 125

## 2023-12-14 MED ORDER — OSELTAMIVIR PHOSPHATE 75 MG PO CAPS
75.0000 mg | ORAL_CAPSULE | Freq: Two times a day (BID) | ORAL | Status: AC
  Administered 2023-12-14 – 2023-12-16 (×4): 75 mg via ORAL
  Filled 2023-12-14 (×4): qty 1

## 2023-12-14 MED ORDER — METOPROLOL TARTRATE 5 MG/5ML IV SOLN
5.0000 mg | Freq: Once | INTRAVENOUS | Status: DC | PRN
  Filled 2023-12-14: qty 5

## 2023-12-14 NOTE — Progress Notes (Signed)
 Mag. Level this A.M. is 1.7 H.R. remains At. Fib RVR 150's Dr. Loney Loh paged. Judeth Cornfield RN aware.

## 2023-12-14 NOTE — Progress Notes (Signed)
   12/14/23 0243  Assess: MEWS Score  Temp 98.3 F (36.8 C)  BP 131/88  MAP (mmHg) 99  Pulse Rate (!) 127  ECG Heart Rate (!) 126  Resp (!) 23  Level of Consciousness Alert  SpO2 95 %  O2 Device Room Air  Assess: MEWS Score  MEWS Temp 0  MEWS Systolic 0  MEWS Pulse 2  MEWS RR 1  MEWS LOC 0  MEWS Score 3  MEWS Score Color Yellow  Assess: if the MEWS score is Yellow or Red  Were vital signs accurate and taken at a resting state? Yes  Does the patient meet 2 or more of the SIRS criteria? Yes  Does the patient have a confirmed or suspected source of infection? Yes  MEWS guidelines implemented  Yes, yellow  Treat  MEWS Interventions Considered administering scheduled or prn medications/treatments as ordered  Take Vital Signs  Increase Vital Sign Frequency  Yellow: Q2hr x1, continue Q4hrs until patient remains green for 12hrs  Escalate  MEWS: Escalate Yellow: Discuss with charge nurse and consider notifying provider and/or RRT  Notify: Charge Nurse/RN  Name of Charge Nurse/RN Notified Engineering geologist  Assess: SIRS CRITERIA  SIRS Temperature  0  SIRS Respirations  1  SIRS Pulse 1  SIRS WBC 0  SIRS Score Sum  2   Patient went into At. Fib. RVR 140. EKG done Text paged Dr. Rosanne Gutting RN aware

## 2023-12-14 NOTE — Progress Notes (Signed)
 [facility] PROGRESS NOTE   Shandon Burlingame OZH:[id number] DOB: [birth year] DOA: 12/12/2023  PCP: Nona Dell, NP  Brief History: 73 year old M with PMH of CVA with right hemiparesis and bedbound, HFrEF, PAF on Eliquis, DM-2, diffuse large B-cell lymphoma, IgA MGUS, BPH, HTN, HLD and obesity brought to ED from SNF due to altered mental status, hypoxemia, productive cough and fatigue, and admitted with working diagnosis of acute respiratory failure with hypoxia due to influenza A infection, bacterial pneumonia and AKI.  Reportedly saturating at 86% on RA when EMS arrived and started on 6 L.  He tested positive for influenza at the day prior, and started on Tamiflu.     Consultants: None  Procedures: None    Subjective/Interval History: Patient denies any chest pain or shortness of breath.  Overnight he went into A-fib with RVR.  Does have a cough.  Feeling thirsty.    Assessment/Plan:  Acute hypoxic respiratory failure due to influenza A with pneumonia:  Tested positive at SNF and started on Tamiflu the day prior.  Desaturated to 86% when EMS arrived and started on 6 L.  Not on oxygen at baseline.  CXR suggested cardiomegaly, vascular congestion and bibasilar opacities but appear dry on presentation. Patient remains on Tamiflu, ceftriaxone and doxycycline. Patient now down to 3 L oxygen from 6 L.  Saturations are in the mid 90s. Speech therapy to evaluate due to concern for aspiration. WBC is elevated but he is on steroids.  Procalcitonin 0.34. Plan for 5-day course of antibacterials.  Paroxysmal atrial fibrillation/now with RVR Patient with known history of paroxysmal atrial fibrillation.  Was on carvedilol and Eliquis.  Went into RVR overnight.  Was given metoprolol with no improvement.  Will start him on Cardizem infusion.  Blood pressure is stable.   Continue with Eliquis.  Acute kidney injury Baseline Cr ~0.7-0.8.  Came in with creatinine of 1.77.  Likely due  to poor oral intake and acute illness.  Hydrated with improvement in renal function noted this morning.  Creatinine is around 1.22.   Monitor urine output.  Avoid nephrotoxic agents.  Hold off on IV fluids due to his history of CHF.   Chronic systolic CHF TTE in 11/2023 with LVEF of 25 to 30%, GH, G1-DD, and normal RVSP To be given IV fluids for hypovolemia as discussed above.  ARB spironolactone and furosemide on hold.  Can be resumed tomorrow depending on clinical status and vital signs. Carvedilol being continued.   History of CVA with residual right hemiparesis and bedbound -Continue Eliquis and Lipitor.   IDDM-2 with hyperglycemia A1c 8.2%.  Hyperglycemia probably due to steroid.  Noted to be on SSI and Lantus.  May need higher dose of Lantus but will see how he does over the next 24 hours.  He is noted to be on steroids.  Might add a nighttime dose.    Essential hypertension Continue to monitor.  See discussion above.   Hyperlipidemia: -Continue atorvastatin.   BPH: -Continue Proscar.   History of diffuse large B-cell lymphoma and IgA MGUS: Not on active treatment, no acute issues.   Morbid obesity: Elevated BMI with comorbidity as above. Body mass index is 35.87 kg/m.   DVT Prophylaxis: On Eliquis Code Status: DNR Family Communication: No family at bedside Disposition Plan: It appears that he is from skilled nursing facility.  Anticipate return to facility when improved.  Status is: Inpatient Remains inpatient appropriate because: Acute respiratory failure with hypoxia due to influenza  Medications: Scheduled:  apixaban  5 mg Oral BID   atorvastatin  80 mg Oral Daily   carvedilol  6.25 mg Oral BID WC   doxycycline  100 mg Oral Q12H   finasteride  5 mg Oral Daily   guaiFENesin  600 mg Oral BID   insulin aspart  0-15 Units Subcutaneous TID WC   insulin aspart  0-5 Units Subcutaneous QHS   insulin aspart  3 Units Subcutaneous TID WC   insulin glargine  15  Units Subcutaneous Daily   methylPREDNISolone (SOLU-MEDROL) injection  40 mg Intravenous Q24H   oseltamivir  30 mg Oral BID   sodium chloride flush  3 mL Intravenous Q12H   Continuous:  cefTRIAXone (ROCEPHIN)  IV 2 g (12/13/23 2341)   diltiazem (CARDIZEM) infusion 5 mg/hr (12/14/23 1000)   [id number] **OR** acetaminophen, bisacodyl, hydrOXYzine, ipratropium-albuterol, metoprolol tartrate, prochlorperazine, senna-docusate  Antibiotics: Anti-infectives (From admission, onward)    Start     Dose/Rate Route Frequency Ordered Stop   12/13/23 1000  oseltamivir (TAMIFLU) capsule 30 mg       Placed in "Followed by" Linked Group   30 mg Oral 2 times daily 12/12/23 2359 12/17/23 0959   12/13/23 0000  cefTRIAXone (ROCEPHIN) 2 g in sodium chloride 0.9 % 100 mL IVPB        2 g 200 mL/hr over 30 Minutes Intravenous Every 24 hours 12/12/23 2350 12/17/23 2359   12/13/23 0000  doxycycline (VIBRA-TABS) tablet 100 mg        100 mg Oral Every 12 hours 12/12/23 2350     12/13/23 0000  oseltamivir (TAMIFLU) capsule 75 mg       Placed in "Followed by" Linked Group   75 mg Oral  Once 12/12/23 2359 12/13/23 0100       Objective:  Vital Signs  Vitals:   12/14/23 0518 12/14/23 0530 12/14/23 0555 12/14/23 0838  BP: (!) 123/107 (!) 127/95 (!) 117/90 (!) 129/104  Pulse: (!) 137 (!) 136 (!) 140 (!) 46  Resp: 20 20 (!) 22 (!) 21  Temp: 97.8 F (36.6 C) 97.8 F (36.6 C)  98 F (36.7 C)  TempSrc: Axillary Axillary  Axillary  SpO2: 93% 93% 94% 96%  Weight:      Height:        Intake/Output Summary (Last 24 hours) at 12/14/2023 1023 Last data filed at 12/14/2023 0215 Gross per 24 hour  Intake --  Output 400 ml  Net -400 ml   Filed Weights   12/12/23 2021 12/14/23 0234  Weight: 113.4 kg 109.3 kg    General appearance: Awake alert.  In no distress.  Distracted. Resp: Coarse breath sounds bilaterally with few crackles at the bases.  No wheezing or rhonchi. Cardio: S1-S2 is little  irregular, tachycardic GI: Abdomen is soft.  Nontender nondistended.  Bowel sounds are present normal.  No masses organomegaly Extremities: No edema.  Moving his left side well Neurologic: Right hemiparesis from previous stroke   Lab Results:  Data Reviewed: I have personally reviewed following labs and reports of the imaging studies  CBC: Recent Labs  Lab 12/12/23 2028 12/13/23 0337 12/14/23 0322  WBC 16.5* 16.1* 16.9*  NEUTROABS 13.3*  --   --   HGB 13.8 13.6 13.4  HCT 43.0 42.3 41.2  MCV 89.8 89.8 89.0  PLT 197 190 199    Basic Metabolic Panel: Recent Labs  Lab 12/12/23 2028 12/13/23 0337 12/14/23 0322  NA 141 142 143  K 4.0 4.2 4.2  CL  109 109 107  CO2 22 23 17*  GLUCOSE 160* 195* 239*  BUN 35* 37* 45*  CREATININE 1.77* 1.50* 1.22  CALCIUM 8.8* 8.5* 9.1  MG  --   --  1.7  PHOS  --   --  3.0    GFR: Estimated Creatinine Clearance: 69.7 mL/min (by C-G formula based on SCr of 1.22 mg/dL).  Liver Function Tests: Recent Labs  Lab 12/12/23 2028 12/14/23 0322  AST 25  --   ALT 22  --   ALKPHOS 61  --   BILITOT 0.9  --   PROT 7.6  --   ALBUMIN 3.3* 3.0*    CBG: Recent Labs  Lab 12/13/23 0754 12/13/23 1151 12/13/23 1609 12/13/23 2115 12/14/23 0630  GLUCAP 223* 314* 264* 193* 263*   Recent Results (from the past 240 hours)  Resp panel by RT-PCR (RSV, Flu A&B, Covid) Anterior Nasal Swab     Status: Abnormal   Collection Time: 12/12/23 10:23 PM   Specimen: Anterior Nasal Swab  Result Value Ref Range Status   SARS Coronavirus 2 by RT PCR NEGATIVE NEGATIVE Final   Influenza A by PCR POSITIVE (A) NEGATIVE Final   Influenza B by PCR NEGATIVE NEGATIVE Final    Comment: (NOTE) The Xpert Xpress SARS-CoV-2/FLU/RSV plus assay is intended as an aid in the diagnosis of influenza from Nasopharyngeal swab specimens and should not be used as a sole basis for treatment. Nasal washings and aspirates are unacceptable for Xpert Xpress  SARS-CoV-2/FLU/RSV testing.  Fact Sheet for Patients: BloggerCourse.com  Fact Sheet for Healthcare Providers: SeriousBroker.it  This test is not yet approved or cleared by the [place] FDA and has been authorized for detection and/or diagnosis of SARS-CoV-2 by FDA under an Emergency Use Authorization (EUA). This EUA will remain in effect (meaning this test can be used) for the duration of the COVID-19 declaration under Section 564(b)(1) of the Act, 21 U.S.C. section 360bbb-3(b)(1), unless the authorization is terminated or revoked.     Resp Syncytial Virus by PCR NEGATIVE NEGATIVE Final    Comment: (NOTE) Fact Sheet for Patients: BloggerCourse.com  Fact Sheet for Healthcare Providers: SeriousBroker.it  This test is not yet approved or cleared by the [place] FDA and has been authorized for detection and/or diagnosis of SARS-CoV-2 by FDA under an Emergency Use Authorization (EUA). This EUA will remain in effect (meaning this test can be used) for the duration of the COVID-19 declaration under Section 564(b)(1) of the Act, 21 U.S.C. section 360bbb-3(b)(1), unless the authorization is terminated or revoked.  Performed at [facility], [address] [place], [place] [address]       Radiology Studies: DG Chest Portable 1 View Result Date: 12/12/2023 CLINICAL DATA:  Flu, hypoxia EXAM: PORTABLE CHEST 1 VIEW COMPARISON:  12/16/2022 FINDINGS: Cardiomegaly. Low lung volumes. Bibasilar airspace opacities could reflect atelectasis or infiltrates. No effusions. Mild vascular congestion. No acute bony abnormality. IMPRESSION: Cardiomegaly, vascular congestion. Low lung volumes with bibasilar opacities, favor atelectasis. Infiltrate/pneumonia cannot be completely excluded. Electronically Signed   By: Charlett Nose M.D.   On: 12/12/2023 22:07       LOS: 2 days   Imunique Samad  Rito Ehrlich  [facility] Pager on [email or web address]  12/14/2023, 10:23 AM

## 2023-12-14 NOTE — Progress Notes (Signed)
 Dr. Loney Loh ordered to give Coreg 6.25 p.o. now. Which was given. Judeth Cornfield RN aware.

## 2023-12-14 NOTE — Plan of Care (Signed)
  Problem: Coping: Goal: Ability to adjust to condition or change in health will improve Outcome: Progressing   Problem: Fluid Volume: Goal: Ability to maintain a balanced intake and output will improve Outcome: Progressing   Problem: Health Behavior/Discharge Planning: Goal: Ability to identify and utilize available resources and services will improve Outcome: Progressing   Problem: Metabolic: Goal: Ability to maintain appropriate glucose levels will improve Outcome: Progressing   Problem: Nutritional: Goal: Maintenance of adequate nutrition will improve Outcome: Progressing   Problem: Skin Integrity: Goal: Risk for impaired skin integrity will decrease Outcome: Progressing   Problem: Tissue Perfusion: Goal: Adequacy of tissue perfusion will improve Outcome: Progressing   Problem: Activity: Goal: Ability to tolerate increased activity will improve Outcome: Progressing   Problem: Clinical Measurements: Goal: Ability to maintain a body temperature in the normal range will improve Outcome: Progressing   Problem: Respiratory: Goal: Ability to maintain adequate ventilation will improve Outcome: Progressing Goal: Ability to maintain a clear airway will improve Outcome: Progressing   Problem: Health Behavior/Discharge Planning: Goal: Ability to manage health-related needs will improve Outcome: Progressing   Problem: Clinical Measurements: Goal: Ability to maintain clinical measurements within normal limits will improve Outcome: Progressing

## 2023-12-14 NOTE — Evaluation (Signed)
 Clinical/Bedside Swallow Evaluation Patient Details  Name: Peter Ryan MRN: [id number] Date of Birth: 1951-08-18  Today's Date: 12/14/2023 Time: SLP Start Time (ACUTE ONLY): 0840 SLP Stop Time (ACUTE ONLY): 0854 SLP Time Calculation (min) (ACUTE ONLY): 14 min  Past Medical History:  Past Medical History:  Diagnosis Date   Arthritis    BPH (benign prostatic hyperplasia)    Cancer (HCC)    Non-Hodgkins lymphoma   Chronic heart failure with reduced ejection fraction (HFrEF, <= 40%) (HCC)    Chronic prostatitis    Cirrhosis of liver (HCC)    Diabetes mellitus without complication (HCC)    Dyslipidemia    Elevated PSA    Gastric reflux    Gout    History of cerebrovascular accident (CVA) with residual deficit    HTN (hypertension)    Hyperlipidemia    Neuropathy    Over weight    Paroxysmal atrial fibrillation (HCC)    Psoriasis    Testicular pain, right    Urinary frequency    Past Surgical History:  Past Surgical History:  Procedure Laterality Date   hydrocelectomy     IR FLUORO GUIDE PORT INSERTION RIGHT  04/11/2017   IR REMOVAL TUN ACCESS W/ PORT W/O FL MOD SED  05/29/2018   PILONIDAL CYST EXCISION     TEE WITHOUT CARDIOVERSION N/A 06/09/2020   Procedure: TRANSESOPHAGEAL ECHOCARDIOGRAM (TEE);  Surgeon: Antonieta Iba, MD;  Location: [facility] ORS;  Service: Cardiovascular;  Laterality: N/A;   HPI:  Peter Ryan is a 73 y.o. male with medical history significant for chronic HFrEF (EF 25-30%), PAF on Eliquis, Hx of CVA with residual right hemiparesis and bedbound status, diffuse large B-cell lymphoma, T2DM, HTN, HLD, IgA MGUS, BPH who presented to the ED from SNF for evaluation of altered mental status and hypoxia.  Patient is known to ST service from recent admission to [facility].  Peter Ryan.  Most recent chest x ray was showing low lung volumes  with bibasilar opacities favored to be atelectasis but infiltrate/PNA can not be excluded.    Assessment / Plan / Recommendation  Clinical Impression  Clinical swallowing evaluation was completed using thin and midly thick liquids via spoon and cup, pureed material and dry solids.  Patient was able to self feed with set up.  RN approved patient for PO intake.  Patient endorsed trouble swallowing characterized by choking sometimes on liquids.  Cranial nerve exam was completed and unremarkable.  Lingual, labial, facial and jaw range of motion and strength appeared to be adequate.  Facial sensation appeared to be intact and Peter did not endorse a difference in sensation between the right and left side of his face.  Peter presented with a possible pharyngeal dysphagia.  Mastication of dry solids appeared to be adequate.  Obvious oral issues were not noted.  Swallow trigger was appreciated to palpation. Peter appeared to have issues triggering a swallow especially given thin liquids.  Consistent immediate and delayed cough response was seen given thin liquids via spoon and self fed cups sips.   No other overt s/s of aspiration were seen.  Suggest beginning a dysphagia 3 with mildly thick liquids.  MBS will be completed tomorrow to fully assess swallowing physiology and safety. SLP Visit Diagnosis: Dysphagia, unspecified (R13.10)    Aspiration Risk  Moderate aspiration risk    Diet Recommendation   Dysphagia 3 (mechanical soft);Nectar  Medication Administration: Crushed with puree    Other  Recommendations Oral Care Recommendations: Oral care BID    Recommendations for follow up therapy are one component of a multi-disciplinary discharge planning process, led by the attending physician.  Recommendations may be updated based on patient status, additional functional criteria and insurance authorization.  Follow up Recommendations   TBD     Assistance Recommended at Discharge  TBD  Functional Status Assessment  Patient has had a recent decline in their functional status and demonstrates the ability to make significant improvements in function in a reasonable and predictable amount of time.    Swallow Study   General Date of Onset: 12/13/23 HPI: Peter Ryan is a 73 y.o. male with medical history significant for chronic HFrEF (EF 25-30%), PAF on Eliquis, Hx of CVA with residual right hemiparesis and bedbound status, diffuse large B-cell lymphoma, T2DM, HTN, HLD, IgA MGUS, BPH who presented to the ED from SNF for evaluation of altered mental status and hypoxia.  Patient is known to ST service from recent admission to [facility].  Peter Ryan.  Most recent chest x ray was showing low lung volumes with bibasilar opacities favored to be atelectasis but infiltrate/PNA can not be excluded. Type of Study: Bedside Swallow Evaluation Previous Swallow Assessment: 11/15/2023 Rx'd D3/thin, single sips; no straws Diet Prior to this Study: NPO Temperature Spikes Noted: No Respiratory Status: Nasal cannula History of Recent Intubation: No Behavior/Cognition: Alert;Cooperative;Pleasant mood Oral Cavity Assessment: Within Functional Limits Oral Care Completed by SLP: No Oral Cavity - Dentition: Adequate natural dentition Vision: Functional for self-feeding Self-Feeding Abilities: Able to feed self Patient Positioning: Upright in bed Baseline Vocal Quality: Normal;Low vocal intensity Volitional Swallow: Able to elicit    Oral/Motor/Sensory Function Overall Oral Motor/Sensory Function: Within functional limits   Ice Chips Ice chips: Not tested   Thin Liquid Thin Liquid: Impaired Presentation: Cup;Spoon;Self Fed Pharyngeal  Phase Impairments: Suspected delayed Swallow;Cough - Delayed;Cough - Immediate    Nectar Thick Nectar Thick Liquid: Within functional limits Presentation: Spoon;Cup;Self  Fed   Honey Thick Honey Thick Liquid: Not tested   Puree Puree: Within functional limits Presentation: Self Fed   Solid     Solid: Within functional limits Presentation: Self Fed     Peter Aguas, MA, CCC-SLP Acute Rehab SLP [phone]  Fleet Contras 12/14/2023,9:03 AM

## 2023-12-15 ENCOUNTER — Inpatient Hospital Stay (HOSPITAL_COMMUNITY)

## 2023-12-15 DIAGNOSIS — I5022 Chronic systolic (congestive) heart failure: Secondary | ICD-10-CM | POA: Diagnosis not present

## 2023-12-15 DIAGNOSIS — I480 Paroxysmal atrial fibrillation: Secondary | ICD-10-CM | POA: Diagnosis not present

## 2023-12-15 DIAGNOSIS — J111 Influenza due to unidentified influenza virus with other respiratory manifestations: Secondary | ICD-10-CM | POA: Diagnosis not present

## 2023-12-15 LAB — CBC
HCT: 39.7 % (ref 39.0–52.0)
Hemoglobin: 13 g/dL (ref 13.0–17.0)
MCH: 29 pg (ref 26.0–34.0)
MCHC: 32.7 g/dL (ref 30.0–36.0)
MCV: 88.6 fL (ref 80.0–100.0)
Platelets: 198 10*3/uL (ref 150–400)
RBC: 4.48 MIL/uL (ref 4.22–5.81)
RDW: 15.2 % (ref 11.5–15.5)
WBC: 13.7 10*3/uL — ABNORMAL HIGH (ref 4.0–10.5)
nRBC: 0 % (ref 0.0–0.2)

## 2023-12-15 LAB — GLUCOSE, CAPILLARY
Glucose-Capillary: 281 mg/dL — ABNORMAL HIGH (ref 70–99)
Glucose-Capillary: 331 mg/dL — ABNORMAL HIGH (ref 70–99)
Glucose-Capillary: 225 mg/dL — ABNORMAL HIGH (ref 70–99)
Glucose-Capillary: 222 mg/dL — ABNORMAL HIGH (ref 70–99)

## 2023-12-15 LAB — BASIC METABOLIC PANEL
Anion gap: 11 (ref 5–15)
BUN: 41 mg/dL — ABNORMAL HIGH (ref 8–23)
CO2: 20 mmol/L — ABNORMAL LOW (ref 22–32)
Calcium: 8.6 mg/dL — ABNORMAL LOW (ref 8.9–10.3)
Chloride: 108 mmol/L (ref 98–111)
Creatinine, Ser: 1.14 mg/dL (ref 0.61–1.24)
GFR, Estimated: >60 mL/min (ref >60)
Glucose, Bld: 302 mg/dL — ABNORMAL HIGH (ref 70–99)
Potassium: 3.9 mmol/L (ref 3.5–5.1)
Sodium: 139 mmol/L (ref 135–145)

## 2023-12-15 LAB — MAGNESIUM: Magnesium: 1.8 mg/dL (ref 1.7–2.4)

## 2023-12-15 LAB — STREP PNEUMONIAE URINARY ANTIGEN: Strep Pneumo Urinary Antigen: POSITIVE — AB

## 2023-12-15 MED ORDER — INSULIN GLARGINE 100 UNIT/ML ~~LOC~~ SOLN
12.0000 [IU] | Freq: Every day | SUBCUTANEOUS | Status: DC
  Administered 2023-12-15 – 2023-12-16 (×2): 12 [IU] via SUBCUTANEOUS
  Filled 2023-12-15 (×3): qty 0.12

## 2023-12-15 NOTE — Progress Notes (Signed)
 [facility] PROGRESS NOTE   Peter Ryan XBJ:[id number] DOB: 1951-09-28 DOA: 12/12/2023  PCP: Nona Dell, NP  Brief History: 73 year old M with PMH of CVA with right hemiparesis and bedbound, HFrEF, PAF on Eliquis, DM-2, diffuse large B-cell lymphoma, IgA MGUS, BPH, HTN, HLD and obesity brought to ED from SNF due to altered mental status, hypoxemia, productive cough and fatigue, and admitted with working diagnosis of acute respiratory failure with hypoxia due to influenza A infection, bacterial pneumonia and AKI.  Reportedly saturating at 86% on RA when EMS arrived and started on 6 L.  He tested positive for influenza at the day prior, and started on Tamiflu.     Consultants: None  Procedures: None    Subjective/Interval History: Patient mentioned that he is feeling better.  Less short of breath.  Cough is improving.  No new complaints offered.    Assessment/Plan:  Acute hypoxic respiratory failure due to influenza A with pneumonia:  Tested positive at SNF and started on Tamiflu the day prior.  Desaturated to 86% when EMS arrived and started on 6 L.  Not on oxygen at baseline.  CXR suggested cardiomegaly, vascular congestion and bibasilar opacities but appear dry on presentation. Patient remains on Tamiflu, ceftriaxone and doxycycline. Seen by speech therapy.  Started on dysphagia 3 diet. Patient's oxygenation has improved.  He is now on room air saturating in the mid 90s. WBC is elevated but he is on steroids.  Procalcitonin 0.34. Plan for 5-day course of antibacterials.  Paroxysmal atrial fibrillation/now with RVR Patient with known history of paroxysmal atrial fibrillation.  Was on carvedilol and Eliquis.   He went into RVR likely due to his acute respiratory issues.  Heart rate is well-controlled on Cardizem infusion.  Will leave him on the infusion for now and transition to oral agents tomorrow. Continue with Eliquis.    Acute kidney injury Baseline Cr  ~0.7-0.8.  Came in with creatinine of 1.77.  Likely due to poor oral intake and acute illness.   Patient was hydrated.  Renal function is significantly improved.   Monitor urine output.  Avoid nephrotoxic agents.  Hold off on IV fluids due to his history of CHF.   Chronic systolic CHF TTE in 11/2023 with LVEF of 25 to 30%, GH, G1-DD, and normal RVSP Patient was given IV fluids which she has tolerated well.  No evidence for volume overload currently. ARB spironolactone and furosemide on hold.   Consider resuming it in the next 24 to 48 hours depending on clinical status.   Carvedilol being continued.   History of CVA with residual right hemiparesis and bedbound -Continue Eliquis and Lipitor.   IDDM-2 with hyperglycemia A1c 8.2%.  Hyperglycemia probably due to steroid.  Noted to be on SSI and Lantus.   Nighttime Lantus dose was added yesterday.  CBGs remain poorly controlled.  Will further titrate dose of Lantus.  CBGs should improve as steroid is tapered down   Essential hypertension Continue to monitor.  See discussion above.  Blood pressure is reasonably well-controlled.   Hyperlipidemia: -Continue atorvastatin.   BPH: -Continue Proscar.   History of diffuse large B-cell lymphoma and IgA MGUS: Not on active treatment, no acute issues.   Morbid obesity: Elevated BMI with comorbidity as above. Body mass index is 35.87 kg/m.   DVT Prophylaxis: On Eliquis Code Status: DNR Family Communication: Wife was updated yesterday. Disposition Plan: It appears that he is from skilled nursing facility.  Anticipate return to facility when improved.  Status  is: Inpatient Remains inpatient appropriate because: Acute respiratory failure with hypoxia due to influenza      Medications: Scheduled:  apixaban  5 mg Oral BID   atorvastatin  80 mg Oral Daily   carvedilol  6.25 mg Oral BID WC   doxycycline  100 mg Oral Q12H   finasteride  5 mg Oral Daily   guaiFENesin  600 mg Oral BID    insulin aspart  0-15 Units Subcutaneous TID WC   insulin aspart  0-5 Units Subcutaneous QHS   insulin aspart  3 Units Subcutaneous TID WC   insulin glargine  15 Units Subcutaneous Daily   insulin glargine  8 Units Subcutaneous QHS   methylPREDNISolone (SOLU-MEDROL) injection  40 mg Intravenous Q24H   oseltamivir  75 mg Oral BID   sodium chloride flush  3 mL Intravenous Q12H   Continuous:  cefTRIAXone (ROCEPHIN)  IV 2 g (12/14/23 2312)   diltiazem (CARDIZEM) infusion 5 mg/hr (12/14/23 1000)   RUE:[id number] **OR** acetaminophen, bisacodyl, hydrOXYzine, ipratropium-albuterol, metoprolol tartrate, prochlorperazine, senna-docusate  Antibiotics: Anti-infectives (From admission, onward)    Start     Dose/Rate Route Frequency Ordered Stop   12/14/23 2200  oseltamivir (TAMIFLU) capsule 75 mg       Placed in "Followed by" Linked Group   75 mg Oral 2 times daily 12/14/23 1055 12/17/23 0959   12/13/23 1000  oseltamivir (TAMIFLU) capsule 30 mg  Status:  Discontinued       Placed in "Followed by" Linked Group   30 mg Oral 2 times daily 12/12/23 2359 12/14/23 1055   12/13/23 0000  cefTRIAXone (ROCEPHIN) 2 g in sodium chloride 0.9 % 100 mL IVPB        2 g 200 mL/hr over 30 Minutes Intravenous Every 24 hours 12/12/23 2350 12/17/23 2359   12/13/23 0000  doxycycline (VIBRA-TABS) tablet 100 mg        100 mg Oral Every 12 hours 12/12/23 2350 12/17/23 2159   12/13/23 0000  oseltamivir (TAMIFLU) capsule 75 mg       Placed in "Followed by" Linked Group   75 mg Oral  Once 12/12/23 2359 12/13/23 0100       Objective:  Vital Signs  Vitals:   12/15/23 0144 12/15/23 0159 12/15/23 0800 12/15/23 0900  BP: (!) 119/95  139/80 136/75  Pulse: 96  62 (!) 53  Resp: 19  20 20   Temp: 97.9 F (36.6 C)  (!) 97.5 F (36.4 C)   TempSrc: Oral  Oral   SpO2: 96%  97% 95%  Weight:  109.3 kg    Height:        Intake/Output Summary (Last 24 hours) at 12/15/2023 0943 Last data filed at 12/15/2023 0900 Gross  per 24 hour  Intake 360 ml  Output 650 ml  Net -290 ml   Filed Weights   12/12/23 2021 12/14/23 0234 12/15/23 0159  Weight: 113.4 kg 109.3 kg 109.3 kg    General appearance: Awake alert.  In no distress Resp: Clear to auscultation bilaterally.  Normal effort Cardio: S1-S2 is irregularly irregular GI: Abdomen is soft.  Nontender nondistended.  Bowel sounds are present normal.  No masses organomegaly Extremities: No edema.  Full range of motion of lower extremities. Neurologic: Right hemiparesis from previous stroke    Lab Results:  Data Reviewed: I have personally reviewed following labs and reports of the imaging studies  CBC: Recent Labs  Lab 12/12/23 2028 12/13/23 0337 12/14/23 0322 12/15/23 0355  WBC 16.5* 16.1* 16.9* 13.7*  NEUTROABS 13.3*  --   --   --   HGB 13.8 13.6 13.4 13.0  HCT 43.0 42.3 41.2 39.7  MCV 89.8 89.8 89.0 88.6  PLT 197 190 199 198    Basic Metabolic Panel: Recent Labs  Lab 12/12/23 2028 12/13/23 0337 12/14/23 0322 12/15/23 0355  NA 141 142 143 139  K 4.0 4.2 4.2 3.9  CL 109 109 107 108  CO2 22 23 17* 20*  GLUCOSE 160* 195* 239* 302*  BUN 35* 37* 45* 41*  CREATININE 1.77* 1.50* 1.22 1.14  CALCIUM 8.8* 8.5* 9.1 8.6*  MG  --   --  1.7 1.8  PHOS  --   --  3.0  --     GFR: Estimated Creatinine Clearance: 74.6 mL/min (by C-G formula based on SCr of 1.14 mg/dL).  Liver Function Tests: Recent Labs  Lab 12/12/23 2028 12/14/23 0322  AST 25  --   ALT 22  --   ALKPHOS 61  --   BILITOT 0.9  --   PROT 7.6  --   ALBUMIN 3.3* 3.0*    CBG: Recent Labs  Lab 12/14/23 0630 12/14/23 1154 12/14/23 1658 12/14/23 2119 12/15/23 0609  GLUCAP 263* 300* 283* 270* 281*   Recent Results (from the past 240 hours)  Resp panel by RT-PCR (RSV, Flu A&B, Covid) Anterior Nasal Swab     Status: Abnormal   Collection Time: 12/12/23 10:23 PM   Specimen: Anterior Nasal Swab  Result Value Ref Range Status   SARS Coronavirus 2 by RT PCR NEGATIVE  NEGATIVE Final   Influenza A by PCR POSITIVE (A) NEGATIVE Final   Influenza B by PCR NEGATIVE NEGATIVE Final    Comment: (NOTE) The Xpert Xpress SARS-CoV-2/FLU/RSV plus assay is intended as an aid in the diagnosis of influenza from Nasopharyngeal swab specimens and should not be used as a sole basis for treatment. Nasal washings and aspirates are unacceptable for Xpert Xpress SARS-CoV-2/FLU/RSV testing.  Fact Sheet for Patients: BloggerCourse.com  Fact Sheet for Healthcare Providers: SeriousBroker.it  This test is not yet approved or cleared by the [place] FDA and has been authorized for detection and/or diagnosis of SARS-CoV-2 by FDA under an Emergency Use Authorization (EUA). This EUA will remain in effect (meaning this test can be used) for the duration of the COVID-19 declaration under Section 564(b)(1) of the Act, 21 U.S.C. section 360bbb-3(b)(1), unless the authorization is terminated or revoked.     Resp Syncytial Virus by PCR NEGATIVE NEGATIVE Final    Comment: (NOTE) Fact Sheet for Patients: BloggerCourse.com  Fact Sheet for Healthcare Providers: SeriousBroker.it  This test is not yet approved or cleared by the [place] FDA and has been authorized for detection and/or diagnosis of SARS-CoV-2 by FDA under an Emergency Use Authorization (EUA). This EUA will remain in effect (meaning this test can be used) for the duration of the COVID-19 declaration under Section 564(b)(1) of the Act, 21 U.S.C. section 360bbb-3(b)(1), unless the authorization is terminated or revoked.  Performed at [facility], [address], [place], [place] [address]       Radiology Studies: No results found.      LOS: 3 days   Seraya Jobst [employer] on [email or web address]  12/15/2023, 9:43 AM

## 2023-12-15 NOTE — Plan of Care (Signed)
  Problem: Health Behavior/Discharge Planning: Goal: Ability to identify and utilize available resources and services will improve Outcome: Progressing

## 2023-12-15 NOTE — Inpatient Diabetes Management (Signed)
 Inpatient Diabetes Program Recommendations  AACE/ADA: New Consensus Statement on Inpatient Glycemic Control (2015)  Target Ranges:  Prepandial:   less than 140 mg/dL      Peak postprandial:   less than 180 mg/dL (1-2 hours)      Critically ill patients:  140 - 180 mg/dL   Lab Results  Component Value Date   GLUCAP 281 (H) 12/15/2023   HGBA1C 8.2 (H) 11/14/2023   Diabetes history: Type 2 DM Outpatient Diabetes medications: Novolog 5 units TID, Semglee 15 units QD Current orders for Inpatient glycemic control: Lantus 15 units every day, 12 units at bedtime, Novolog 3 units TID, Novolog 0-15 units TID & HS Solumedrol 40 mg QD  Inpatient Diabetes Program Recommendations:    May also consider further increasing meal coverage- Novolog 5 units TID (assuming patient is consuming >50% of meals) Secure chat sent to Md.   Thanks, Lujean Rave, MSN, RNC-OB Diabetes Coordinator [phone] (8a-5p)

## 2023-12-15 NOTE — Evaluation (Signed)
 Modified Barium Swallow Study  Patient Details  Name: Peter Ryan MRN: [id number] Date of Birth: June 03, 1951  Today's Date: 12/15/2023  Modified Barium Swallow completed.  Full report located under Chart Review in the Imaging Section.  History of Present Illness Peter Ryan is a 73 y.o. male with medical history significant for chronic HFrEF (EF 25-30%), PAF on Eliquis, Hx of CVA with residual right hemiparesis and bedbound status, diffuse large B-cell lymphoma, T2DM, HTN, HLD, IgA MGUS, BPH who presented to the ED from SNF for evaluation of altered mental status and hypoxia.  Patient is known to ST service from recent admission to [facility].  He had clinical swallowing evaluation on 11/14/2023 wtih recommendation for dysphagia 3 with thin liquids taking single sips with no straw use.  Most recent chest x ray was showing low lung volumes with bibasilar opacities favored to be atelectasis but infiltrate/PNA can not be excluded.   Clinical Impression Patient presents with a mild oropharyngeal dysphagia characterized primarily by delayed swallow initiation. With thin liquids,swallow is initiated when bolus is in the vallecula however because vallecula is full, bolus then spills over during the swallow and is either silently aspirated (x1) or penetrated. Note that silent aspiration occurred only on the initial bolus and that penetration episodes do remain above the vocal cords. Concern however for aspiration increases given silent nature. Given that patient does have what is suspected to be PNA from aspiration, would recommend initiation of more conservative diet. Nectar thick liquids consumed today with full airway protection. Peter of bolus from pharyngeal space for all consistencies largely [facility]. Factors that may increase risk of adverse event in presence of aspiration Peter Ryan & Peter Ryan 2021): Limited mobility  Swallow Evaluation Recommendations Recommendations: PO diet PO Diet Recommendation:  Regular;Mildly thick liquids (Level 2, nectar thick) Liquid Administration via: Cup;Straw Medication Administration: Whole meds with puree Supervision: Patient able to self-feed;Intermittent supervision/cueing for swallowing strategies Swallowing strategies  : Slow rate;Small bites/sips Postural changes: Position pt fully upright for meals Oral care recommendations: Oral care BID (2x/day) Caregiver Recommendations: Avoid jello, ice cream, thin soups, popsicles;Remove water pitcher;Have oral suction available     Peter Snell MA, CCC-SLP  Peter Ryan 12/15/2023,1:48 PM

## 2023-12-16 DIAGNOSIS — J111 Influenza due to unidentified influenza virus with other respiratory manifestations: Secondary | ICD-10-CM | POA: Diagnosis not present

## 2023-12-16 DIAGNOSIS — I480 Paroxysmal atrial fibrillation: Secondary | ICD-10-CM | POA: Diagnosis not present

## 2023-12-16 DIAGNOSIS — I5022 Chronic systolic (congestive) heart failure: Secondary | ICD-10-CM | POA: Diagnosis not present

## 2023-12-16 LAB — GLUCOSE, CAPILLARY
Glucose-Capillary: 308 mg/dL — ABNORMAL HIGH (ref 70–99)
Glucose-Capillary: 304 mg/dL — ABNORMAL HIGH (ref 70–99)
Glucose-Capillary: 241 mg/dL — ABNORMAL HIGH (ref 70–99)
Glucose-Capillary: 276 mg/dL — ABNORMAL HIGH (ref 70–99)

## 2023-12-16 LAB — BASIC METABOLIC PANEL
Anion gap: 14 (ref 5–15)
BUN: 34 mg/dL — ABNORMAL HIGH (ref 8–23)
CO2: 19 mmol/L — ABNORMAL LOW (ref 22–32)
Calcium: 8.9 mg/dL (ref 8.9–10.3)
Chloride: 106 mmol/L (ref 98–111)
Creatinine, Ser: 0.86 mg/dL (ref 0.61–1.24)
GFR, Estimated: >60 mL/min (ref >60)
Glucose, Bld: 291 mg/dL — ABNORMAL HIGH (ref 70–99)
Potassium: 4.1 mmol/L (ref 3.5–5.1)
Sodium: 139 mmol/L (ref 135–145)

## 2023-12-16 MED ORDER — ALLOPURINOL 300 MG PO TABS
300.0000 mg | ORAL_TABLET | Freq: Every day | ORAL | Status: DC
  Administered 2023-12-16 – 2023-12-18 (×3): 300 mg via ORAL
  Filled 2023-12-16 (×3): qty 1

## 2023-12-16 MED ORDER — FUROSEMIDE 20 MG PO TABS
20.0000 mg | ORAL_TABLET | Freq: Every day | ORAL | Status: DC
  Administered 2023-12-17 – 2023-12-18 (×2): 20 mg via ORAL
  Filled 2023-12-16 (×2): qty 1

## 2023-12-16 MED ORDER — METHYLPREDNISOLONE SODIUM SUCC 40 MG IJ SOLR
20.0000 mg | INTRAMUSCULAR | Status: DC
  Administered 2023-12-16 – 2023-12-17 (×2): 20 mg via INTRAVENOUS
  Filled 2023-12-16 (×2): qty 1

## 2023-12-16 MED ORDER — DILTIAZEM HCL 30 MG PO TABS
60.0000 mg | ORAL_TABLET | Freq: Four times a day (QID) | ORAL | Status: DC
  Administered 2023-12-16 – 2023-12-18 (×8): 60 mg via ORAL
  Filled 2023-12-16 (×8): qty 2

## 2023-12-16 MED ORDER — SPIRONOLACTONE 25 MG PO TABS
25.0000 mg | ORAL_TABLET | Freq: Every evening | ORAL | Status: DC
  Administered 2023-12-16 – 2023-12-17 (×2): 25 mg via ORAL
  Filled 2023-12-16 (×2): qty 1

## 2023-12-16 NOTE — Care Management Important Message (Signed)
 Important Message  Patient Details  Name: Eula Mazzola MRN: [id number] Date of Birth: 04/12/1951   Important Message Given:  Yes - Medicare IM     Renie Ora 12/16/2023, 10:24 AM

## 2023-12-16 NOTE — Progress Notes (Signed)
 [facility] PROGRESS NOTE   Ewan Grau ZOX:[id number] DOB: 07/18/1951 DOA: 12/12/2023  PCP: Nona Dell, NP  Brief History: 73 year old M with PMH of CVA with right hemiparesis and bedbound, HFrEF, PAF on Eliquis, DM-2, diffuse large B-cell lymphoma, IgA MGUS, BPH, HTN, HLD and obesity brought to ED from SNF due to altered mental status, hypoxemia, productive cough and fatigue, and admitted with working diagnosis of acute respiratory failure with hypoxia due to influenza A infection, bacterial pneumonia and AKI.  Reportedly saturating at 86% on RA when EMS arrived and started on 6 L.  He tested positive for influenza at the day prior, and started on Tamiflu.     Consultants: None  Procedures: None    Subjective/Interval History: Patient denies any complaints this morning.  Overall he feels better.  No chest pain or shortness of breath.  Cough is improving.     Assessment/Plan:  Acute hypoxic respiratory failure due to influenza A with pneumonia:  Tested positive at SNF and started on Tamiflu the day prior.  Desaturated to 86% when EMS arrived and started on 6 L.  Not on oxygen at baseline.  CXR suggested cardiomegaly, vascular congestion and bibasilar opacities but appear dry on presentation. Patient remains on Tamiflu, ceftriaxone and doxycycline. Seen by speech therapy.  Started on dysphagia 3 diet. Patient's oxygenation has improved.  He is saturating in the early 90s on room air. Procalcitonin 0.34. Plan for 5-day course of antibacterials.  Start tapering steroids.  Paroxysmal atrial fibrillation/now with RVR Patient with known history of paroxysmal atrial fibrillation.  Was on carvedilol and Eliquis.   He went into RVR likely due to his acute respiratory issues.   Heart rate is well-controlled on Cardizem infusion.  Remains in atrial fibrillation.  Will transition to oral Cardizem.   Continue with Eliquis.    Acute kidney injury Baseline Cr ~0.7-0.8.   Came in with creatinine of 1.77.  Likely due to poor oral intake and acute illness.   Patient was hydrated.  Renal function is significantly improved.   Monitor urine output.  Avoid nephrotoxic agents.  Hold off on IV fluids due to his history of CHF.   Chronic systolic CHF TTE in 11/2023 with LVEF of 25 to 30%, GH, G1-DD, and normal RVSP Patient was given IV fluids which she has tolerated well.  No evidence for volume overload currently. ARB spironolactone and furosemide on hold.   Resume furosemide and spironolactone today. Carvedilol being continued.   History of CVA with residual right hemiparesis and bedbound -Continue Eliquis and Lipitor.   IDDM-2 with hyperglycemia A1c 8.2%.  Hyperglycemia probably due to steroid.  Noted to be on SSI and Lantus.   Elevated glucose levels due to steroids.  Will start tapering steroids today so should see improvement in glucose levels.  Continue current dose of Lantus.   Essential hypertension Continue to monitor.  See discussion above.  Blood pressure is reasonably well-controlled.   Hyperlipidemia: -Continue atorvastatin.   BPH: -Continue Proscar.   History of diffuse large B-cell lymphoma and IgA MGUS: Not on active treatment, no acute issues.   Morbid obesity: Elevated BMI with comorbidity as above. Body mass index is 35.87 kg/m.   DVT Prophylaxis: On Eliquis Code Status: DNR Family Communication: Will update wife later today. Disposition Plan: It appears that he is from skilled nursing facility.  Anticipate return to facility when improved.  Status is: Inpatient Remains inpatient appropriate because: Acute respiratory failure with hypoxia due to influenza  Medications: Scheduled:  apixaban  5 mg Oral BID   atorvastatin  80 mg Oral Daily   carvedilol  6.25 mg Oral BID WC   doxycycline  100 mg Oral Q12H   finasteride  5 mg Oral Daily   guaiFENesin  600 mg Oral BID   insulin aspart  0-15 Units Subcutaneous TID WC    insulin aspart  0-5 Units Subcutaneous QHS   insulin aspart  3 Units Subcutaneous TID WC   insulin glargine  12 Units Subcutaneous QHS   insulin glargine  15 Units Subcutaneous Daily   methylPREDNISolone (SOLU-MEDROL) injection  40 mg Intravenous Q24H   sodium chloride flush  3 mL Intravenous Q12H   Continuous:  cefTRIAXone (ROCEPHIN)  IV 2 g (12/15/23 2129)   diltiazem (CARDIZEM) infusion 5 mg/hr (12/15/23 1016)   GNF:AOZHYQMVHQION **OR** acetaminophen, bisacodyl, hydrOXYzine, ipratropium-albuterol, metoprolol tartrate, prochlorperazine, senna-docusate  Antibiotics: Anti-infectives (From admission, onward)    Start     Dose/Rate Route Frequency Ordered Stop   12/14/23 2200  oseltamivir (TAMIFLU) capsule 75 mg       Placed in "Followed by" Linked Group   75 mg Oral 2 times daily 12/14/23 1055 12/16/23 0851   12/13/23 1000  oseltamivir (TAMIFLU) capsule 30 mg  Status:  Discontinued       Placed in "Followed by" Linked Group   30 mg Oral 2 times daily 12/12/23 2359 12/14/23 1055   12/13/23 0000  cefTRIAXone (ROCEPHIN) 2 g in sodium chloride 0.9 % 100 mL IVPB        2 g 200 mL/hr over 30 Minutes Intravenous Every 24 hours 12/12/23 2350 12/17/23 2159   12/13/23 0000  doxycycline (VIBRA-TABS) tablet 100 mg        100 mg Oral Every 12 hours 12/12/23 2350 12/17/23 2159   12/13/23 0000  oseltamivir (TAMIFLU) capsule 75 mg       Placed in "Followed by" Linked Group   75 mg Oral  Once 12/12/23 2359 12/13/23 0100       Objective:  Vital Signs  Vitals:   12/15/23 1900 12/15/23 2300 12/16/23 0300 12/16/23 0730  BP: 134/61 130/75 125/89 (!) 130/93  Pulse: 88 89 91 (!) 102  Resp: 20 19 19 20   Temp: (!) 97.5 F (36.4 C) 97.9 F (36.6 C) 97.6 F (36.4 C) 97.6 F (36.4 C)  TempSrc: Oral Oral Oral Oral  SpO2: 93% 95% 92%   Weight:      Height:        Intake/Output Summary (Last 24 hours) at 12/16/2023 1052 Last data filed at 12/16/2023 0900 Gross per 24 hour  Intake 873.92 ml   Output 1100 ml  Net -226.08 ml   Filed Weights   12/12/23 2021 12/14/23 0234 12/15/23 0159  Weight: 113.4 kg 109.3 kg 109.3 kg   General appearance: Awake alert.  In no distress Resp: Improved air entry bilaterally.  Few crackles at the bases. Cardio: S1-S2 is irregularly irregular GI: Abdomen is soft.  Nontender nondistended.  Bowel sounds are present normal.  No masses organomegaly Extremities: No edema.  Full range of motion of lower extremities. Neurologic: Right hemiparesis from previous stroke  Lab Results:  Data Reviewed: I have personally reviewed following labs and reports of the imaging studies  CBC: Recent Labs  Lab 12/12/23 2028 12/13/23 0337 12/14/23 0322 12/15/23 0355  WBC 16.5* 16.1* 16.9* 13.7*  NEUTROABS 13.3*  --   --   --   HGB 13.8 13.6 13.4 13.0  HCT 43.0 42.3  41.2 39.7  MCV 89.8 89.8 89.0 88.6  PLT 197 190 199 198    Basic Metabolic Panel: Recent Labs  Lab 12/12/23 2028 12/13/23 0337 12/14/23 0322 12/15/23 0355 12/16/23 0429  NA 141 142 143 139 139  K 4.0 4.2 4.2 3.9 4.1  CL 109 109 107 108 106  CO2 22 23 17* 20* 19*  GLUCOSE 160* 195* 239* 302* 291*  BUN 35* 37* 45* 41* 34*  CREATININE 1.77* 1.50* 1.22 1.14 0.86  CALCIUM 8.8* 8.5* 9.1 8.6* 8.9  MG  --   --  1.7 1.8  --   PHOS  --   --  3.0  --   --     GFR: Estimated Creatinine Clearance: 98.9 mL/min (by C-G formula based on SCr of 0.86 mg/dL).  Liver Function Tests: Recent Labs  Lab 12/12/23 2028 12/14/23 0322  AST 25  --   ALT 22  --   ALKPHOS 61  --   BILITOT 0.9  --   PROT 7.6  --   ALBUMIN 3.3* 3.0*    CBG: Recent Labs  Lab 12/15/23 0609 12/15/23 1126 12/15/23 1553 12/15/23 2109 12/16/23 0607  GLUCAP 281* 331* 225* 222* 308*   Recent Results (from the past 240 hours)  Resp panel by RT-PCR (RSV, Flu A&B, Covid) Anterior Nasal Swab     Status: Abnormal   Collection Time: 12/12/23 10:23 PM   Specimen: Anterior Nasal Swab  Result Value Ref Range Status    SARS Coronavirus 2 by RT PCR NEGATIVE NEGATIVE Final   Influenza A by PCR POSITIVE (A) NEGATIVE Final   Influenza B by PCR NEGATIVE NEGATIVE Final    Comment: (NOTE) The Xpert Xpress SARS-CoV-2/FLU/RSV plus assay is intended as an aid in the diagnosis of influenza from Nasopharyngeal swab specimens and should not be used as a sole basis for treatment. Nasal washings and aspirates are unacceptable for Xpert Xpress SARS-CoV-2/FLU/RSV testing.  Fact Sheet for Patients: BloggerCourse.com  Fact Sheet for Healthcare Providers: SeriousBroker.it  This test is not yet approved or cleared by the [place] FDA and has been authorized for detection and/or diagnosis of SARS-CoV-2 by FDA under an Emergency Use Authorization (EUA). This EUA will remain in effect (meaning this test can be used) for the duration of the COVID-19 declaration under Section 564(b)(1) of the Act, 21 U.S.C. section 360bbb-3(b)(1), unless the authorization is terminated or revoked.     Resp Syncytial Virus by PCR NEGATIVE NEGATIVE Final    Comment: (NOTE) Fact Sheet for Patients: BloggerCourse.com  Fact Sheet for Healthcare Providers: SeriousBroker.it  This test is not yet approved or cleared by the [place] FDA and has been authorized for detection and/or diagnosis of SARS-CoV-2 by FDA under an Emergency Use Authorization (EUA). This EUA will remain in effect (meaning this test can be used) for the duration of the COVID-19 declaration under Section 564(b)(1) of the Act, 21 U.S.C. section 360bbb-3(b)(1), unless the authorization is terminated or revoked.  Performed at [facility], [address] [place], [place] [address]       Radiology Studies: DG Swallowing Func-Speech Pathology Result Date: 12/15/2023 Table formatting from the original result was not included. Modified Barium Swallow Study  Patient Details Name: Alika Saladin MRN: [id number] Date of Birth: 03/20/1951 Today's Date: 12/15/2023 HPI/PMH: HPI: Padraic Marinos is a 73 y.o. male with medical history significant for chronic HFrEF (EF 25-30%), PAF on Eliquis, Hx of CVA with residual right hemiparesis and bedbound status, diffuse large B-cell  lymphoma, T2DM, HTN, HLD, IgA MGUS, BPH who presented to the ED from SNF for evaluation of altered mental status and hypoxia.  Patient is known to ST service from recent admission to [facility].  He had clinical swallowing evaluation on 11/14/2023 wtih recommendation for dysphagia 3 with thin liquids taking single sips with no straw use.  Most recent chest x ray was showing low lung volumes with bibasilar opacities favored to be atelectasis but infiltrate/PNA can not be excluded. Clinical Impression: Clinical Impression: Patient presents with a mild oropharyngeal dysphagia characterized primarily by delayed swallow initiation. With thin liquids,swallow is initiated when bolus is in the vallecula however because vallecula is full, bolus then spills over during the swallow and is either silently aspirated (x1) or penetrated. Note that silent aspiration occurred only on the initial bolus and that penetration episodes do remain above the vocal cords. Concern however for aspiration increases given silent nature. Given that patient does have what is suspected to be PNA from aspiration, would recommend initiation of more conservative diet. Nectar thick liquids consumed today with full airway protection. Clearance of bolus from pharyngeal space for all consistencies largely [facility]. Factors that may increase risk of adverse event in presence of aspiration Rubye Oaks & Clearance Coots 2021): Factors that may increase risk of adverse event in presence of aspiration Rubye Oaks & Clearance Coots 2021): Limited mobility Recommendations/Plan: Swallowing Evaluation Recommendations Swallowing Evaluation Recommendations Recommendations: PO diet PO Diet  Recommendation: Regular; Mildly thick liquids (Level 2, nectar thick) Liquid Administration via: Cup; Straw Medication Administration: Whole meds with puree Supervision: Patient able to self-feed; Intermittent supervision/cueing for swallowing strategies Swallowing strategies  : Slow rate; Small bites/sips Postural changes: Position pt fully upright for meals Oral care recommendations: Oral care BID (2x/day) Caregiver Recommendations: Avoid jello, ice cream, thin soups, popsicles; Remove water pitcher; Have oral suction available Treatment Plan Treatment Plan Treatment recommendations: Therapy as outlined in treatment plan below Follow-up recommendations: -- (TBD) Functional status assessment: Patient has had a recent decline in their functional status and demonstrates the ability to make significant improvements in function in a reasonable and predictable amount of time. Treatment frequency: Min 2x/week Treatment duration: 2 weeks Interventions: Aspiration precaution training; Compensatory techniques; Patient/family education; Trials of upgraded texture/liquids; Diet toleration management by SLP Recommendations Recommendations for follow up therapy are one component of a multi-disciplinary discharge planning process, led by the attending physician.  Recommendations may be updated based on patient status, additional functional criteria and insurance authorization. Assessment: Orofacial Exam: Orofacial Exam Oral Cavity: Oral Hygiene: WFL Oral Cavity - Dentition: Adequate natural dentition Orofacial Anatomy: WFL Anatomy: Anatomy: WFL Boluses Administered: Boluses Administered Boluses Administered: Thin liquids (Level 0); Mildly thick liquids (Level 2, nectar thick); Moderately thick liquids (Level 3, honey thick); Puree; Solid  Oral Impairment Domain: Oral Impairment Domain Lip Closure: No labial escape Tongue control during bolus hold: Posterior escape of less than half of bolus (intermittent) Bolus  preparation/mastication: Timely and efficient chewing and mashing Bolus transport/lingual motion: Brisk tongue motion Oral residue: Trace residue lining oral structures Location of oral residue : Tongue; Palate Initiation of pharyngeal swallow : Valleculae  Pharyngeal Impairment Domain: Pharyngeal Impairment Domain Soft palate elevation: No bolus between soft palate (SP)/pharyngeal wall (PW) Laryngeal elevation: Complete superior movement of thyroid cartilage with complete approximation of arytenoids to epiglottic petiole Anterior hyoid excursion: Complete anterior movement Epiglottic movement: Complete inversion Laryngeal vestibule closure: Incomplete, narrow column air/contrast in laryngeal vestibule Pharyngeal stripping wave : Present - complete Pharyngeal contraction (A/P view only): N/A Pharyngoesophageal segment  opening: Complete distension and complete duration, no obstruction of flow Tongue base retraction: Trace column of contrast or air between tongue base and PPW Pharyngeal residue: Trace residue within or on pharyngeal structures Location of pharyngeal residue: Valleculae  Esophageal Impairment Domain: Esophageal Impairment Domain Esophageal clearance upright position: Complete clearance, esophageal coating Pill: No data recorded Penetration/Aspiration Scale Score: Penetration/Aspiration Scale Score 1.  Material does not enter airway: Mildly thick liquids (Level 2, nectar thick); Moderately thick liquids (Level 3, honey thick); Puree; Solid 3.  Material enters airway, remains ABOVE vocal cords and not ejected out: Thin liquids (Level 0) 8.  Material enters airway, passes BELOW cords without attempt by patient to eject out (silent aspiration) : Thin liquids (Level 0) Compensatory Strategies: Compensatory Strategies Compensatory strategies: Yes Chin tuck: Ineffective (to prevent penetration)   General Information: Caregiver present: No  Diet Prior to this Study: Dysphagia 3 (mechanical soft); Mildly thick  liquids (Level 2, nectar thick)   Temperature : Normal   Respiratory Status: WFL   Supplemental O2: None (Room air)   History of Recent Intubation: No  Behavior/Cognition: Alert; Cooperative; Pleasant mood Self-Feeding Abilities: Able to self-feed Baseline vocal quality/speech: Normal Volitional Cough: Unable to elicit (only throat clear) Volitional Swallow: Unable to elicit No data recorded Goal Planning: Prognosis for improved oropharyngeal function: Guarded Barriers to Reach Goals: Other (Comment) (unclear origin, CVA 3 years ago) No data recorded Patient/Family Stated Goal: None stated Consulted and agree with results and recommendations: Patient; Nurse Pain: Pain Assessment Pain Assessment: No/denies pain End of Session: Start Time:SLP Start Time (ACUTE ONLY): 1309 Stop Time: SLP Stop Time (ACUTE ONLY): 1330 Time Calculation:SLP Time Calculation (min) (ACUTE ONLY): 21 min Charges: SLP Evaluations $ SLP Speech Visit: 1 Visit SLP Evaluations $BSS Swallow: 1 Procedure $MBS Swallow: 1 Procedure SLP visit diagnosis: SLP Visit Diagnosis: Dysphagia, oropharyngeal phase (R13.12) Past Medical History: Past Medical History: Diagnosis Date  Arthritis   BPH (benign prostatic hyperplasia)   Cancer (HCC)   Non-Hodgkins lymphoma  Chronic heart failure with reduced ejection fraction (HFrEF, <= 40%) (HCC)   Chronic prostatitis   Cirrhosis of liver (HCC)   Diabetes mellitus without complication (HCC)   Dyslipidemia   Elevated PSA   Gastric reflux   Gout   History of cerebrovascular accident (CVA) with residual deficit   HTN (hypertension)   Hyperlipidemia   Neuropathy   Over weight   Paroxysmal atrial fibrillation (HCC)   Psoriasis   Testicular pain, right   Urinary frequency  Past Surgical History: Past Surgical History: Procedure Laterality Date  hydrocelectomy    IR FLUORO GUIDE PORT INSERTION RIGHT  04/11/2017  IR REMOVAL TUN ACCESS W/ PORT W/O FL MOD SED  05/29/2018  PILONIDAL CYST EXCISION    TEE WITHOUT CARDIOVERSION N/A  06/09/2020  Procedure: TRANSESOPHAGEAL ECHOCARDIOGRAM (TEE);  Surgeon: Antonieta Iba, MD;  Location: [facility] ORS;  Service: Cardiovascular;  Laterality: N/A; Note populated for Ferdinand Lango, SLP Mahala Menghini., M.A. CCC-SLP Acute Rehabilitation Services Office [phone] Secure chat preferred 12/15/2023, 3:59 PM       LOS: 4 days   Robina Hamor Rito Ehrlich  [facility] Pager on [email or web address]  12/16/2023, 10:52 AM

## 2023-12-16 NOTE — Progress Notes (Signed)
 Speech Language Pathology Treatment: Dysphagia  Patient Details Name: Peter Ryan MRN: [id number] DOB: January 17, 1951 Today's Date: 12/16/2023 Time: 0865-7846 SLP Time Calculation (min) (ACUTE ONLY): 17 min  Assessment / Plan / Recommendation Clinical Impression  Pt was seen dysphagia tx focused on compensatory strategy training. Pt was observed with nectar-thick liquids and a graham cracker, displaying intermittent, immediate throat clearing and delayed coughing; unclear if delayed coughing is due to potential aspiration events. SLP instructed pt in utilizing a throat clear after each swallow to optimize airway protection due to risk of silent aspiration. Intermittent dry swallows were also introduced to help clear residue from throat clears. Min verbal and visual cues were utilized with pt verbalizing understanding of strategies; pt demonstrated understanding by teaching-back strategies to SLP.   SLP will f/u to monitor pt's compliance with compensatory strategies and to potentially trial upgraded textures.   HPI HPI: Peter Ryan is a 73 y.o. male with medical history significant for chronic HFrEF (EF 25-30%), PAF on Eliquis, Hx of CVA with residual right hemiparesis and bedbound status, diffuse large B-cell lymphoma, T2DM, HTN, HLD, IgA MGUS, BPH who presented to the ED from SNF for evaluation of altered mental status and hypoxia.  Patient is known to ST service from recent admission to [facility].  He had clinical swallowing evaluation on 11/14/2023 wtih recommendation for dysphagia 3 with thin liquids taking single sips with no straw use.  Most recent chest x ray was showing low lung volumes with bibasilar opacities favored to be atelectasis but infiltrate/PNA can not be excluded.      SLP Plan  Continue with current plan of care      Recommendations for follow up therapy are one component of a multi-disciplinary discharge planning process, led by the attending physician.  Recommendations may  be updated based on patient status, additional functional criteria and insurance authorization.    Recommendations  Compensations: Small sips/bites;Clear throat after each swallow;Multiple dry swallows after each bite/sip                  Oral care BID     Dysphagia, oropharyngeal phase (R13.12)     Continue with current plan of care     Peter Ryan  12/16/2023, 10:45 AM

## 2023-12-16 NOTE — Plan of Care (Signed)
  Problem: Fluid Volume: Goal: Ability to maintain a balanced intake and output will improve Outcome: Progressing   Problem: Nutritional: Goal: Maintenance of adequate nutrition will improve Outcome: Progressing   Problem: Coping: Goal: Level of anxiety will decrease Outcome: Progressing   Problem: Coping: Goal: Ability to adjust to condition or change in health will improve Outcome: Not Progressing   Problem: Health Behavior/Discharge Planning: Goal: Ability to manage health-related needs will improve Outcome: Not Progressing   Problem: Metabolic: Goal: Ability to maintain appropriate glucose levels will improve Outcome: Not Progressing   Problem: Nutritional: Goal: Progress toward achieving an optimal weight will improve Outcome: Not Progressing

## 2023-12-17 DIAGNOSIS — J9601 Acute respiratory failure with hypoxia: Secondary | ICD-10-CM | POA: Diagnosis not present

## 2023-12-17 LAB — BASIC METABOLIC PANEL
Anion gap: 13 (ref 5–15)
BUN: 32 mg/dL — ABNORMAL HIGH (ref 8–23)
CO2: 22 mmol/L (ref 22–32)
Calcium: 9 mg/dL (ref 8.9–10.3)
Chloride: 104 mmol/L (ref 98–111)
Creatinine, Ser: 1.01 mg/dL (ref 0.61–1.24)
GFR, Estimated: >60 mL/min (ref >60)
Glucose, Bld: 266 mg/dL — ABNORMAL HIGH (ref 70–99)
Potassium: 4.4 mmol/L (ref 3.5–5.1)
Sodium: 139 mmol/L (ref 135–145)

## 2023-12-17 LAB — GLUCOSE, CAPILLARY
Glucose-Capillary: 290 mg/dL — ABNORMAL HIGH (ref 70–99)
Glucose-Capillary: 251 mg/dL — ABNORMAL HIGH (ref 70–99)
Glucose-Capillary: 211 mg/dL — ABNORMAL HIGH (ref 70–99)
Glucose-Capillary: 183 mg/dL — ABNORMAL HIGH (ref 70–99)

## 2023-12-17 LAB — CBC
HCT: 43.8 % (ref 39.0–52.0)
Hemoglobin: 14.3 g/dL (ref 13.0–17.0)
MCH: 28.4 pg (ref 26.0–34.0)
MCHC: 32.6 g/dL (ref 30.0–36.0)
MCV: 87.1 fL (ref 80.0–100.0)
Platelets: 204 10*3/uL (ref 150–400)
RBC: 5.03 MIL/uL (ref 4.22–5.81)
RDW: 14.6 % (ref 11.5–15.5)
WBC: 12.9 10*3/uL — ABNORMAL HIGH (ref 4.0–10.5)
nRBC: 0 % (ref 0.0–0.2)

## 2023-12-17 LAB — LEGIONELLA PNEUMOPHILA SEROGP 1 UR AG: L. pneumophila Serogp 1 Ur Ag: NEGATIVE

## 2023-12-17 MED ORDER — INSULIN GLARGINE 100 UNIT/ML ~~LOC~~ SOLN
18.0000 [IU] | Freq: Two times a day (BID) | SUBCUTANEOUS | Status: DC
  Administered 2023-12-17 – 2023-12-18 (×2): 18 [IU] via SUBCUTANEOUS
  Filled 2023-12-17 (×3): qty 0.18

## 2023-12-17 NOTE — Plan of Care (Signed)
  Problem: Fluid Volume: Goal: Ability to maintain a balanced intake and output will improve Outcome: Progressing   Problem: Coping: Goal: Ability to adjust to condition or change in health will improve Outcome: Progressing   Problem: Nutritional: Goal: Maintenance of adequate nutrition will improve Outcome: Progressing Goal: Progress toward achieving an optimal weight will improve Outcome: Progressing   Problem: Skin Integrity: Goal: Risk for impaired skin integrity will decrease Outcome: Progressing   Problem: Activity: Goal: Ability to tolerate increased activity will improve Outcome: Progressing   Problem: Education: Goal: Knowledge of General Education information will improve Description: Including pain rating scale, medication(s)/side effects and non-pharmacologic comfort measures Outcome: Progressing

## 2023-12-17 NOTE — Progress Notes (Signed)
 PROGRESS NOTE    Peter Ryan  JYN:[id number] DOB: March 21, 1951 DOA: 12/12/2023 PCP: Nona Dell, NP   Brief Narrative:  73 year old M with PMH of CVA with right hemiparesis and bedbound, HFrEF, PAF on Eliquis, DM-2, diffuse large B-cell lymphoma, IgA MGUS, BPH, HTN, HLD and obesity brought to ED from SNF due to altered mental status, hypoxemia, productive cough and fatigue, and admitted with working diagnosis of acute respiratory failure with hypoxia due to influenza A infection, bacterial pneumonia and AKI. Reportedly saturating at 86% on RA when EMS arrived and started on 6 L. He tested positive for influenza the day prior, and started on Tamiflu.   Assessment & Plan:   Acute hypoxic respiratory failure due to influenza A with probable bacterial pneumonia:  -Tested positive at SNF and started on Tamiflu the day prior.  Desaturated to 86% when EMS arrived and started on 6 L.  Not on oxygen at baseline.  CXR suggested cardiomegaly, vascular congestion and bibasilar opacities but appeared dry on presentation. -Treated with IV Solu-Medrol, Tamiflu, ceftriaxone and doxycycline. -Diet as per SLP recommendations -Respiratory status has improved.  Currently on room air. -Complete 5-day course of antibiotics.  Steroids being tapered.  Will switch to oral prednisone tomorrow.   Paroxysmal atrial fibrillation/now with RVR -Required Cardizem drip and subsequently transitioned to oral Cardizem.  Heart rate mostly controlled.  Continue Eliquis.   Acute kidney injury Baseline Cr ~0.7-0.8.  Came in with creatinine of 1.77.  Likely due to poor oral intake and acute illness.   -Treated with IV fluids and subsequently discontinued.  Creatinine has normalized.   Chronic systolic CHF Essential hypertension Hyperlipidemia -TTE in 11/2023 with LVEF of 25 to 30% -Currently compensated.  Strict input and output.  Daily weights.  Fluid restriction.  Diuretics initially held but subsequently resumed.   Continue Coreg, spironolactone, Lasix.  ARB on hold.  Blood pressure currently stable.  Outpatient follow-up with cardiology -Continue statin   History of CVA with residual right hemiparesis and bedbound -Continue Eliquis and Lipitor.  Fall precautions.  Dysphagia -Diet as per SLP recommendations  Leukocytosis -Improving.  Monitor   IDDM-2 with hyperglycemia -A1c 8.2%.   -Blood sugars remain elevated.  Continue long-acting and short acting insulin along with CBGs with SSI.  Blood sugars hopefully will improve with tapering of steroids.   BPH: -Continue Proscar.   History of diffuse large B-cell lymphoma and IgA MGUS: -Not on active treatment, no acute issues.   Obesity class I  -Outpatient follow-up    DVT Prophylaxis: On Eliquis Code Status: DNR Family Communication: Will update wife later today. Disposition Plan: Status is: Inpatient Remains inpatient appropriate because: Of severity of illness.  Possible discharge to SNF in 1 to 2 days if condition improves   Consultants: None  Procedures: None  Antimicrobials:  Anti-infectives (From admission, onward)    Start     Dose/Rate Route Frequency Ordered Stop   12/14/23 2200  oseltamivir (TAMIFLU) capsule 75 mg       Placed in "Followed by" Linked Group   75 mg Oral 2 times daily 12/14/23 1055 12/16/23 0851   12/13/23 1000  oseltamivir (TAMIFLU) capsule 30 mg  Status:  Discontinued       Placed in "Followed by" Linked Group   30 mg Oral 2 times daily 12/12/23 2359 12/14/23 1055   12/13/23 0000  cefTRIAXone (ROCEPHIN) 2 g in sodium chloride 0.9 % 100 mL IVPB        2 g 200 mL/hr over  30 Minutes Intravenous Every 24 hours 12/12/23 2350 12/16/23 2322   12/13/23 0000  doxycycline (VIBRA-TABS) tablet 100 mg        100 mg Oral Every 12 hours 12/12/23 2350 12/17/23 2159   12/13/23 0000  oseltamivir (TAMIFLU) capsule 75 mg       Placed in "Followed by" Linked Group   75 mg Oral  Once 12/12/23 2359 12/13/23 0100         Subjective: Patient seen and examined at bedside.  Feels weak but slightly better.  Cough is improving.  No fever, vomiting, chest pain reported.  Objective: Vitals:   12/16/23 0730 12/16/23 1608 12/16/23 2000 12/17/23 0400  BP: (!) 130/93 132/73 123/70 (!) 154/87  Pulse: (!) 102 85 72   Resp: 20 19 19 16   Temp: 97.6 F (36.4 C) 97.8 F (36.6 C) 97.7 F (36.5 C) 97.8 F (36.6 C)  TempSrc: Oral Oral Oral Oral  SpO2:  93% 96% 92%  Weight:      Height:        Intake/Output Summary (Last 24 hours) at 12/17/2023 0708 Last data filed at 12/17/2023 0432 Gross per 24 hour  Intake 240 ml  Output 600 ml  Net -360 ml   Filed Weights   12/12/23 2021 12/14/23 0234 12/15/23 0159  Weight: 113.4 kg 109.3 kg 109.3 kg    Examination:  General exam: Appears calm and comfortable.  On room air.  Looks chronically ill and deconditioned. Respiratory system: Bilateral decreased breath sounds at bases with scattered crackles Cardiovascular system: S1 & S2 heard, Rate controlled  Gastrointestinal system: Abdomen is obese, nondistended, soft and nontender. Normal bowel sounds heard. Extremities: No cyanosis, clubbing; trace lower extremity edema Central nervous system: Awake, slow to respond, right-sided hemiparesis present. Skin: No rashes, lesions or ulcers Psychiatry: Flat affect.  Not agitated.   Data Reviewed: I have personally reviewed following labs and imaging studies  CBC: Recent Labs  Lab 12/12/23 2028 12/13/23 0337 12/14/23 0322 12/15/23 0355 12/17/23 0314  WBC 16.5* 16.1* 16.9* 13.7* 12.9*  NEUTROABS 13.3*  --   --   --   --   HGB 13.8 13.6 13.4 13.0 14.3  HCT 43.0 42.3 41.2 39.7 43.8  MCV 89.8 89.8 89.0 88.6 87.1  PLT 197 190 199 198 204   Basic Metabolic Panel: Recent Labs  Lab 12/13/23 0337 12/14/23 0322 12/15/23 0355 12/16/23 0429 12/17/23 0314  NA 142 143 139 139 139  K 4.2 4.2 3.9 4.1 4.4  CL 109 107 108 106 104  CO2 23 17* 20* 19* 22  GLUCOSE  195* 239* 302* 291* 266*  BUN 37* 45* 41* 34* 32*  CREATININE 1.50* 1.22 1.14 0.86 1.01  CALCIUM 8.5* 9.1 8.6* 8.9 9.0  MG  --  1.7 1.8  --   --   PHOS  --  3.0  --   --   --    GFR: Estimated Creatinine Clearance: 84.2 mL/min (by C-G formula based on SCr of 1.01 mg/dL). Liver Function Tests: Recent Labs  Lab 12/12/23 2028 12/14/23 0322  AST 25  --   ALT 22  --   ALKPHOS 61  --   BILITOT 0.9  --   PROT 7.6  --   ALBUMIN 3.3* 3.0*   No results for input(s): "LIPASE", "AMYLASE" in the last 168 hours. No results for input(s): "AMMONIA" in the last 168 hours. Coagulation Profile: No results for input(s): "INR", "PROTIME" in the last 168 hours. Cardiac Enzymes: No results  for input(s): "CKTOTAL", "CKMB", "CKMBINDEX", "TROPONINI" in the last 168 hours. BNP (last 3 results) No results for input(s): "PROBNP" in the last 8760 hours. HbA1C: No results for input(s): "HGBA1C" in the last 72 hours. CBG: Recent Labs  Lab 12/16/23 0607 12/16/23 1103 12/16/23 1627 12/16/23 2123 12/17/23 0615  GLUCAP 308* 304* 241* 276* 290*   Lipid Profile: No results for input(s): "CHOL", "HDL", "LDLCALC", "TRIG", "CHOLHDL", "LDLDIRECT" in the last 72 hours. Thyroid Function Tests: No results for input(s): "TSH", "T4TOTAL", "FREET4", "T3FREE", "THYROIDAB" in the last 72 hours. Anemia Panel: No results for input(s): "VITAMINB12", "FOLATE", "FERRITIN", "TIBC", "IRON", "RETICCTPCT" in the last 72 hours. Sepsis Labs: Recent Labs  Lab 12/13/23 0337  PROCALCITON 0.34    Recent Results (from the past 240 hours)  Resp panel by RT-PCR (RSV, Flu A&B, Covid) Anterior Nasal Swab     Status: Abnormal   Collection Time: 12/12/23 10:23 PM   Specimen: Anterior Nasal Swab  Result Value Ref Range Status   SARS Coronavirus 2 by RT PCR NEGATIVE NEGATIVE Final   Influenza A by PCR POSITIVE (A) NEGATIVE Final   Influenza B by PCR NEGATIVE NEGATIVE Final    Comment: (NOTE) The Xpert Xpress SARS-CoV-2/FLU/RSV  plus assay is intended as an aid in the diagnosis of influenza from Nasopharyngeal swab specimens and should not be used as a sole basis for treatment. Nasal washings and aspirates are unacceptable for Xpert Xpress SARS-CoV-2/FLU/RSV testing.  Fact Sheet for Patients: BloggerCourse.com  Fact Sheet for Healthcare Providers: SeriousBroker.it  This test is not yet approved or cleared by the [place] FDA and has been authorized for detection and/or diagnosis of SARS-CoV-2 by FDA under an Emergency Use Authorization (EUA). This EUA will remain in effect (meaning this test can be used) for the duration of the COVID-19 declaration under Section 564(b)(1) of the Act, 21 U.S.C. section 360bbb-3(b)(1), unless the authorization is terminated or revoked.     Resp Syncytial Virus by PCR NEGATIVE NEGATIVE Final    Comment: (NOTE) Fact Sheet for Patients: BloggerCourse.com  Fact Sheet for Healthcare Providers: SeriousBroker.it  This test is not yet approved or cleared by the [place] FDA and has been authorized for detection and/or diagnosis of SARS-CoV-2 by FDA under an Emergency Use Authorization (EUA). This EUA will remain in effect (meaning this test can be used) for the duration of the COVID-19 declaration under Section 564(b)(1) of the Act, 21 U.S.C. section 360bbb-3(b)(1), unless the authorization is terminated or revoked.  Performed at [facility], [address] [place], [place] [address]          Radiology Studies: DG Swallowing Func-Speech Pathology Result Date: 12/15/2023 Table formatting from the original result was not included. Modified Barium Swallow Study Patient Details Name: Peter Ryan MRN: [id number] Date of Birth: December 31, 1950 Today's Date: 12/15/2023 HPI/PMH: HPI: Taniela Feltus is a 73 y.o. male with medical history significant for chronic HFrEF  (EF 25-30%), PAF on Eliquis, Hx of CVA with residual right hemiparesis and bedbound status, diffuse large B-cell lymphoma, T2DM, HTN, HLD, IgA MGUS, BPH who presented to the ED from SNF for evaluation of altered mental status and hypoxia.  Patient is known to ST service from recent admission to [facility].  He had clinical swallowing evaluation on 11/14/2023 wtih recommendation for dysphagia 3 with thin liquids taking single sips with no straw use.  Most recent chest x ray was showing low lung volumes with bibasilar opacities favored to be atelectasis but infiltrate/PNA can not be excluded.  Clinical Impression: Clinical Impression: Patient presents with a mild oropharyngeal dysphagia characterized primarily by delayed swallow initiation. With thin liquids,swallow is initiated when bolus is in the vallecula however because vallecula is full, bolus then spills over during the swallow and is either silently aspirated (x1) or penetrated. Note that silent aspiration occurred only on the initial bolus and that penetration episodes do remain above the vocal cords. Concern however for aspiration increases given silent nature. Given that patient does have what is suspected to be PNA from aspiration, would recommend initiation of more conservative diet. Nectar thick liquids consumed today with full airway protection. Clearance of bolus from pharyngeal space for all consistencies largely [facility]. Factors that may increase risk of adverse event in presence of aspiration Rubye Oaks & Clearance Coots 2021): Factors that may increase risk of adverse event in presence of aspiration Rubye Oaks & Clearance Coots 2021): Limited mobility Recommendations/Plan: Swallowing Evaluation Recommendations Swallowing Evaluation Recommendations Recommendations: PO diet PO Diet Recommendation: Regular; Mildly thick liquids (Level 2, nectar thick) Liquid Administration via: Cup; Straw Medication Administration: Whole meds with puree Supervision: Patient able to self-feed;  Intermittent supervision/cueing for swallowing strategies Swallowing strategies  : Slow rate; Small bites/sips Postural changes: Position pt fully upright for meals Oral care recommendations: Oral care BID (2x/day) Caregiver Recommendations: Avoid jello, ice cream, thin soups, popsicles; Remove water pitcher; Have oral suction available Treatment Plan Treatment Plan Treatment recommendations: Therapy as outlined in treatment plan below Follow-up recommendations: -- (TBD) Functional status assessment: Patient has had a recent decline in their functional status and demonstrates the ability to make significant improvements in function in a reasonable and predictable amount of time. Treatment frequency: Min 2x/week Treatment duration: 2 weeks Interventions: Aspiration precaution training; Compensatory techniques; Patient/family education; Trials of upgraded texture/liquids; Diet toleration management by SLP Recommendations Recommendations for follow up therapy are one component of a multi-disciplinary discharge planning process, led by the attending physician.  Recommendations may be updated based on patient status, additional functional criteria and insurance authorization. Assessment: Orofacial Exam: Orofacial Exam Oral Cavity: Oral Hygiene: WFL Oral Cavity - Dentition: Adequate natural dentition Orofacial Anatomy: WFL Anatomy: Anatomy: WFL Boluses Administered: Boluses Administered Boluses Administered: Thin liquids (Level 0); Mildly thick liquids (Level 2, nectar thick); Moderately thick liquids (Level 3, honey thick); Puree; Solid  Oral Impairment Domain: Oral Impairment Domain Lip Closure: No labial escape Tongue control during bolus hold: Posterior escape of less than half of bolus (intermittent) Bolus preparation/mastication: Timely and efficient chewing and mashing Bolus transport/lingual motion: Brisk tongue motion Oral residue: Trace residue lining oral structures Location of oral residue : Tongue; Palate  Initiation of pharyngeal swallow : Valleculae  Pharyngeal Impairment Domain: Pharyngeal Impairment Domain Soft palate elevation: No bolus between soft palate (SP)/pharyngeal wall (PW) Laryngeal elevation: Complete superior movement of thyroid cartilage with complete approximation of arytenoids to epiglottic petiole Anterior hyoid excursion: Complete anterior movement Epiglottic movement: Complete inversion Laryngeal vestibule closure: Incomplete, narrow column air/contrast in laryngeal vestibule Pharyngeal stripping wave : Present - complete Pharyngeal contraction (A/P view only): N/A Pharyngoesophageal segment opening: Complete distension and complete duration, no obstruction of flow Tongue base retraction: Trace column of contrast or air between tongue base and PPW Pharyngeal residue: Trace residue within or on pharyngeal structures Location of pharyngeal residue: Valleculae  Esophageal Impairment Domain: Esophageal Impairment Domain Esophageal clearance upright position: Complete clearance, esophageal coating Pill: No data recorded Penetration/Aspiration Scale Score: Penetration/Aspiration Scale Score 1.  Material does not enter airway: Mildly thick liquids (Level 2, nectar thick); Moderately thick liquids (Level  3, honey thick); Puree; Solid 3.  Material enters airway, remains ABOVE vocal cords and not ejected out: Thin liquids (Level 0) 8.  Material enters airway, passes BELOW cords without attempt by patient to eject out (silent aspiration) : Thin liquids (Level 0) Compensatory Strategies: Compensatory Strategies Compensatory strategies: Yes Chin tuck: Ineffective (to prevent penetration)   General Information: Caregiver present: No  Diet Prior to this Study: Dysphagia 3 (mechanical soft); Mildly thick liquids (Level 2, nectar thick)   Temperature : Normal   Respiratory Status: WFL   Supplemental O2: None (Room air)   History of Recent Intubation: No  Behavior/Cognition: Alert; Cooperative; Pleasant mood  Self-Feeding Abilities: Able to self-feed Baseline vocal quality/speech: Normal Volitional Cough: Unable to elicit (only throat clear) Volitional Swallow: Unable to elicit No data recorded Goal Planning: Prognosis for improved oropharyngeal function: Guarded Barriers to Reach Goals: Other (Comment) (unclear origin, CVA 3 years ago) No data recorded Patient/Family Stated Goal: None stated Consulted and agree with results and recommendations: Patient; Nurse Pain: Pain Assessment Pain Assessment: No/denies pain End of Session: Start Time:SLP Start Time (ACUTE ONLY): 1309 Stop Time: SLP Stop Time (ACUTE ONLY): 1330 Time Calculation:SLP Time Calculation (min) (ACUTE ONLY): 21 min Charges: SLP Evaluations $ SLP Speech Visit: 1 Visit SLP Evaluations $BSS Swallow: 1 Procedure $MBS Swallow: 1 Procedure SLP visit diagnosis: SLP Visit Diagnosis: Dysphagia, oropharyngeal phase (R13.12) Past Medical History: Past Medical History: Diagnosis Date  Arthritis   BPH (benign prostatic hyperplasia)   Cancer (HCC)   Non-Hodgkins lymphoma  Chronic heart failure with reduced ejection fraction (HFrEF, <= 40%) (HCC)   Chronic prostatitis   Cirrhosis of liver (HCC)   Diabetes mellitus without complication (HCC)   Dyslipidemia   Elevated PSA   Gastric reflux   Gout   History of cerebrovascular accident (CVA) with residual deficit   HTN (hypertension)   Hyperlipidemia   Neuropathy   Over weight   Paroxysmal atrial fibrillation (HCC)   Psoriasis   Testicular pain, right   Urinary frequency  Past Surgical History: Past Surgical History: Procedure Laterality Date  hydrocelectomy    IR FLUORO GUIDE PORT INSERTION RIGHT  04/11/2017  IR REMOVAL TUN ACCESS W/ PORT W/O FL MOD SED  05/29/2018  PILONIDAL CYST EXCISION    TEE WITHOUT CARDIOVERSION N/A 06/09/2020  Procedure: TRANSESOPHAGEAL ECHOCARDIOGRAM (TEE);  Surgeon: Antonieta Iba, MD;  Location: [facility] ORS;  Service: Cardiovascular;  Laterality: N/A; Note populated for Ferdinand Lango, SLP Mahala Menghini., M.A.  CCC-SLP Acute Rehabilitation Services Office [phone] Secure chat preferred 12/15/2023, 3:59 PM       Scheduled Meds:  allopurinol  300 mg Oral Daily   apixaban  5 mg Oral BID   atorvastatin  80 mg Oral Daily   carvedilol  6.25 mg Oral BID WC   diltiazem  60 mg Oral Q6H   doxycycline  100 mg Oral Q12H   finasteride  5 mg Oral Daily   furosemide  20 mg Oral Daily   guaiFENesin  600 mg Oral BID   insulin aspart  0-15 Units Subcutaneous TID WC   insulin aspart  0-5 Units Subcutaneous QHS   insulin aspart  3 Units Subcutaneous TID WC   insulin glargine  12 Units Subcutaneous QHS   insulin glargine  15 Units Subcutaneous Daily   methylPREDNISolone (SOLU-MEDROL) injection  20 mg Intravenous Q24H   sodium chloride flush  3 mL Intravenous Q12H   spironolactone  25 mg Oral QPM   Continuous Infusions:  Glade Lloyd, MD [facility] 12/17/2023, 7:08 AM

## 2023-12-17 NOTE — Inpatient Diabetes Management (Signed)
 Inpatient Diabetes Program Recommendations  AACE/ADA: New Consensus Statement on Inpatient Glycemic Control (2015)  Target Ranges:  Prepandial:   less than 140 mg/dL      Peak postprandial:   less than 180 mg/dL (1-2 hours)      Critically ill patients:  140 - 180 mg/dL   Lab Results  Component Value Date   GLUCAP 251 (H) 12/17/2023   HGBA1C 8.2 (H) 11/14/2023    Review of Glycemic Control  Latest Reference Range & Units 12/16/23 11:03 12/16/23 16:27 12/16/23 21:23 12/17/23 06:15 12/17/23 12:11  Glucose-Capillary 70 - 99 mg/dL 409 (H) 811 (H) 914 (H) 290 (H) 251 (H)   Diabetes history: DM 2 Outpatient Diabetes medications:  Novolog 5 units tid with meals Semglee 15 units daily Current orders for Inpatient glycemic control:  Novolog 0-15 units tid with meals and HS Novolog 3 units tid with meals  Lantus 15 units q AM and Lantus 12 units q PM Solumedrol 20 mg daily  Inpatient Diabetes Program Recommendations:    Consider further increase of Lantus to 18 units bid.    Thanks, Lorenza Cambridge, RN, BC-ADM Inpatient Diabetes Coordinator Pager [phone]  (8a-5p)

## 2023-12-17 NOTE — TOC Initial Note (Signed)
 Transition of Care [facility]) - Initial/Assessment Note    Patient Details  Name: Peter Ryan MRN: [id number] Date of Birth: [birth year]  Transition of Care [facility]) CM/SW Contact:    Eduard Roux, LCSW Phone Number: 12/17/2023, 12:49 PM  Clinical Narrative:                  CSW met with patient at bedside. CSW introduced self and explained role. Patient confirmed he arrived form [facility] and is expected to return once stable for d/c.  CSW sent message to [facility], patient is expected to d/c 1-2 days.   TOC will continue to follow and assist with discharge planning.  Antony Blackbird, MSW, LCSW Clinical Social Worker    Expected Discharge Plan: Skilled Nursing Facility Barriers to Discharge: Continued Medical Work up   Patient Goals and CMS Choice            Expected Discharge Plan and Services                                              Prior Living Arrangements/Services              Need for Family Participation in Patient Care: Yes (Comment) Care giver support system in place?: Yes (comment)      Activities of Daily Living   ADL Screening (condition at time of admission) Independently performs ADLs?: No Does the patient have a NEW difficulty with bathing/dressing/toileting/self-feeding that is expected to last >3 days?: No Does the patient have a NEW difficulty with getting in/out of bed, walking, or climbing stairs that is expected to last >3 days?: No Does the patient have a NEW difficulty with communication that is expected to last >3 days?: No Is the patient deaf or have difficulty hearing?: No Does the patient have difficulty seeing, even when wearing glasses/contacts?: No Does the patient have difficulty concentrating, remembering, or making decisions?: No  Permission Sought/Granted         Permission granted to share info w AGENCY: [name]        Emotional Assessment       Orientation: : Oriented to Self,  Oriented to Situation, Oriented to Place, Oriented to  Time   Psych Involvement: No (comment)  Admission diagnosis:  Shortness of breath [R06.02] Hypoxia [R09.02] Acute respiratory failure with hypoxia (HCC) [J96.01] Influenza [J11.1] Acute cough [R05.1] Patient Active Problem List   Diagnosis Date Noted   Acute respiratory failure with hypoxia (HCC) 12/12/2023   AKI (acute kidney injury) (HCC) 12/12/2023   Acute exacerbation of CHF (congestive heart failure) (HCC) 11/16/2023   Slurred speech 11/15/2023   Lower extremity edema 11/15/2023   Pressure injury of skin 11/15/2023   Chronic heart failure with reduced ejection fraction (HFrEF, <= 40%) (HCC) 11/14/2023   Dilated cardiomyopathy (HCC) 12/22/2022   Elevated brain natriuretic peptide (BNP) level 12/21/2022   Hypervolemia 12/21/2022   Paroxysmal atrial fibrillation (HCC) 12/20/2022   Pleural effusion, bilateral 12/18/2022   Acute on chronic diastolic CHF (congestive heart failure) (HCC) 12/16/2022   Prolonged QT interval 12/16/2022   Symptomatic anemia 02/07/2022   Hypokalemia 02/07/2022   TIA (transient ischemic attack) 12/01/2020   Right sided weakness 11/30/2020   Hypertension associated with diabetes (HCC) 11/30/2020   History of cerebrovascular accident (CVA) with residual deficit 11/30/2020   Left pontine cerebrovascular accident (HCC) 06/14/2020  Facial droop    Rectal bleeding    Morbid obesity (HCC)    Right hemiparesis (HCC)    Uncontrolled type 2 diabetes mellitus with hyperglycemia, without long-term current use of insulin (HCC)    Acute CVA (cerebrovascular accident) (HCC) 06/05/2020   Obesity, Class III, BMI 40-49.9 (morbid obesity) (HCC) 06/05/2020   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) 05/02/2020   Atherosclerosis of aorta (HCC) 05/02/2020   Goals of care, counseling/discussion 04/08/2017   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 (HCC) 04/08/2017    Liver cirrhosis (HCC) 02/08/2017   SBP (spontaneous bacterial peritonitis) (HCC) 02/07/2017   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 [place] [address] [place] [address] Phone: [phone] Fax: [phone]     Social Drivers of Health (SDOH) Social History: SDOH Screenings   Food Insecurity: No Food Insecurity (12/13/2023)  Housing: Low Risk  (12/13/2023)  Transportation Needs: No Transportation Needs (12/13/2023)  Utilities: Not At Risk (12/13/2023)  Alcohol Screen: Low Risk  (10/17/2021)  Depression (PHQ2-9): Low Risk  (10/17/2021)  Financial Resource Strain: Medium Risk (11/17/2023)  Physical Activity: Unknown (11/17/2023)  Social Connections: Socially Isolated (12/13/2023)  Stress: No Stress Concern Present (10/17/2021)  Tobacco Use: Medium Risk (12/12/2023)   SDOH Interventions:     Readmission Risk Interventions     No data to display

## 2023-12-18 DIAGNOSIS — J9601 Acute respiratory failure with hypoxia: Secondary | ICD-10-CM | POA: Diagnosis not present

## 2023-12-18 LAB — GLUCOSE, CAPILLARY
Glucose-Capillary: 278 mg/dL — ABNORMAL HIGH (ref 70–99)
Glucose-Capillary: 293 mg/dL — ABNORMAL HIGH (ref 70–99)

## 2023-12-18 LAB — BASIC METABOLIC PANEL
Anion gap: 9 (ref 5–15)
BUN: 28 mg/dL — ABNORMAL HIGH (ref 8–23)
CO2: 23 mmol/L (ref 22–32)
Calcium: 8.8 mg/dL — ABNORMAL LOW (ref 8.9–10.3)
Chloride: 104 mmol/L (ref 98–111)
Creatinine, Ser: 0.86 mg/dL (ref 0.61–1.24)
GFR, Estimated: >60 mL/min (ref >60)
Glucose, Bld: 272 mg/dL — ABNORMAL HIGH (ref 70–99)
Potassium: 4 mmol/L (ref 3.5–5.1)
Sodium: 136 mmol/L (ref 135–145)

## 2023-12-18 LAB — CBC WITH DIFFERENTIAL/PLATELET
Abs Immature Granulocytes: 0.37 10*3/uL — ABNORMAL HIGH (ref 0.00–0.07)
Basophils Absolute: 0 10*3/uL (ref 0.0–0.1)
Basophils Relative: 0 %
Eosinophils Absolute: 0 10*3/uL (ref 0.0–0.5)
Eosinophils Relative: 0 %
HCT: 44.2 % (ref 39.0–52.0)
Hemoglobin: 14.9 g/dL (ref 13.0–17.0)
Immature Granulocytes: 2 %
Lymphocytes Relative: 13 %
Lymphs Abs: 2 10*3/uL (ref 0.7–4.0)
MCH: 28.8 pg (ref 26.0–34.0)
MCHC: 33.7 g/dL (ref 30.0–36.0)
MCV: 85.3 fL (ref 80.0–100.0)
Monocytes Absolute: 0.6 10*3/uL (ref 0.1–1.0)
Monocytes Relative: 4 %
Neutro Abs: 12.4 10*3/uL — ABNORMAL HIGH (ref 1.7–7.7)
Neutrophils Relative %: 81 %
Platelets: 245 10*3/uL (ref 150–400)
RBC: 5.18 MIL/uL (ref 4.22–5.81)
RDW: 14.5 % (ref 11.5–15.5)
WBC: 15.4 10*3/uL — ABNORMAL HIGH (ref 4.0–10.5)
nRBC: 0 % (ref 0.0–0.2)

## 2023-12-18 LAB — MAGNESIUM: Magnesium: 1.8 mg/dL (ref 1.7–2.4)

## 2023-12-18 MED ORDER — PREDNISONE 20 MG PO TABS
40.0000 mg | ORAL_TABLET | Freq: Every day | ORAL | Status: AC
Start: 2023-12-18 — End: 2023-12-23

## 2023-12-18 MED ORDER — CARVEDILOL 6.25 MG PO TABS: 12.5000 mg | ORAL_TABLET | Freq: Two times a day (BID) | ORAL | Status: DC

## 2023-12-18 MED ORDER — ALBUTEROL SULFATE HFA 108 (90 BASE) MCG/ACT IN AERS: 2.0000 | INHALATION_SPRAY | RESPIRATORY_TRACT | Status: DC | PRN

## 2023-12-18 NOTE — Discharge Summary (Signed)
 Physician Discharge Summary  Peter Ryan ZOX:[id number] DOB: July 14, 1951 DOA: 12/12/2023  PCP: Nona Dell, NP  Admit date: 12/12/2023 Discharge date: 12/18/2023  Admitted From: SNF Disposition: SNF  Recommendations for Outpatient Follow-up:  Follow up with SNF provider at earliest convenience  follow up in ED if symptoms worsen or new appear   Home Health: No Equipment/Devices: None  Discharge Condition: Stable CODE STATUS: DNR Diet recommendation: Heart healthy  Brief/Interim Summary: 73 year old M with PMH of CVA with right hemiparesis and bedbound, HFrEF, PAF on Eliquis, DM-2, diffuse large B-cell lymphoma, IgA MGUS, BPH, HTN, HLD and obesity brought to ED from SNF due to altered mental status, hypoxemia, productive cough and fatigue, and admitted with working diagnosis of acute respiratory failure with hypoxia due to influenza A infection, bacterial pneumonia and AKI. Reportedly saturating at 86% on RA when EMS arrived and started on 6 L. He tested positive for influenza the day prior, and started on Tamiflu. During the hospitalization, his condition has improved.  He has completed antibiotic and Tamiflu course.  He is currently on room air with stable vitals.  He will be discharged back to SNF today.  Discharge Diagnoses:   Acute hypoxic respiratory failure due to influenza A with probable bacterial pneumonia:  -Tested positive at SNF and started on Tamiflu the day prior.  Desaturated to 86% when EMS arrived and started on 6 L.  Not on oxygen at baseline.  CXR suggested cardiomegaly, vascular congestion and bibasilar opacities but appeared dry on presentation. -Treated with IV Solu-Medrol, Tamiflu, ceftriaxone and doxycycline. -Diet as per SLP recommendations -Respiratory status has improved.  Currently on room air. -Completed 5-day course of antibiotics along with oral Tamiflu.  Steroids being tapered.  Will switch to oral prednisone 40 mg daily from today for total of  5 days. -Discharge patient back to SNF today.   Paroxysmal atrial fibrillation/now with RVR -Required Cardizem drip and subsequently transitioned to oral Cardizem.  Heart rate mostly controlled.  Continue Eliquis. -Increased dose of Coreg on discharge.  Will not continue Cardizem and discharge but this can be started as an outpatient if needed.  Outpatient follow-up with cardiology.   Acute kidney injury Baseline Cr ~0.7-0.8.  Came in with creatinine of 1.77.  Likely due to poor oral intake and acute illness.   -Treated with IV fluids and subsequently discontinued.  Creatinine has normalized.  Outpatient follow-up.  Valsartan to remain on hold on discharge till reevaluation by PCP.   Chronic systolic CHF Essential hypertension Hyperlipidemia -TTE in 11/2023 with LVEF of 25 to 30% -Currently compensated.  Continue diet and fluid restriction.  Diuretics initially held but subsequently resumed.  Continue Coreg, spironolactone, Lasix.  ARB on hold as above.  Blood pressure currently stable.  Outpatient follow-up with cardiology -Continue statin   History of CVA with residual right hemiparesis and bedbound -Continue Eliquis and Lipitor.  Fall precautions.   Dysphagia -Diet as per SLP recommendations   Leukocytosis -Probably steroid-induced.    IDDM-2 with hyperglycemia -A1c 8.2%.   -Carb modified diet.  Continue long-acting and short acting insulin on discharge   BPH: -Continue Proscar.   History of diffuse large B-cell lymphoma and IgA MGUS: -Not on active treatment, no acute issues.   Obesity class I  -Outpatient follow-up    Discharge Instructions   Allergies as of 12/18/2023       Reactions   Hctz [hydrochlorothiazide] Other (See Comments)   Gout flare   Metformin Other (See Comments)   Stomach upset  Medication List     STOP taking these medications    hydrOXYzine 10 MG tablet Commonly known as: ATARAX   potassium chloride SA 20 MEQ tablet Commonly  known as: KLOR-CON M   valsartan 40 MG tablet Commonly known as: DIOVAN       TAKE these medications    acetaminophen 325 MG tablet Commonly known as: TYLENOL Take 2 tablets (650 mg total) by mouth every 4 (four) hours as needed for mild pain (or temp > 37.5 C (99.5 F)).   albuterol 108 (90 Base) MCG/ACT inhaler Commonly known as: VENTOLIN HFA Inhale 2 puffs into the lungs every 4 (four) hours as needed for wheezing or shortness of breath.   allopurinol 300 MG tablet Commonly known as: ZYLOPRIM TAKE ONE TABLET EVERY DAY   apixaban 5 MG Tabs tablet Commonly known as: ELIQUIS Take 1 tablet (5 mg total) by mouth 2 (two) times daily.   atorvastatin 80 MG tablet Commonly known as: LIPITOR TAKE ONE TABLET (80 MG) BY MOUTH EVERY DAY   carvedilol 6.25 MG tablet Commonly known as: COREG Take 2 tablets (12.5 mg total) by mouth 2 (two) times daily with a meal. What changed: how much to take   cyanocobalamin 1000 MCG tablet Commonly known as: VITAMIN B12 Take 1 tablet (1,000 mcg total) by mouth daily.   ferrous sulfate 325 (65 FE) MG tablet Take 1 tablet (325 mg total) by mouth every other day.   finasteride 5 MG tablet Commonly known as: PROSCAR Take 1 tablet (5 mg total) by mouth daily.   furosemide 40 MG tablet Commonly known as: LASIX Take 1 tablet (40 mg total) by mouth daily.   insulin aspart 100 UNIT/ML FlexPen Commonly known as: NOVOLOG Inject 5 Units into the skin 3 (three) times daily with meals.   insulin glargine-yfgn 100 UNIT/ML injection Commonly known as: SEMGLEE Inject 0.15 mLs (15 Units total) into the skin daily.   polyethylene glycol 17 g packet Commonly known as: MIRALAX / GLYCOLAX Take 17 g by mouth daily as needed.   predniSONE 20 MG tablet Commonly known as: DELTASONE Take 2 tablets (40 mg total) by mouth daily with breakfast for 5 days.   Pro-Stat AWC Liqd Take 30 mLs by mouth 2 (two) times daily with a meal.   Robitussin 12 Hour Cough  30 MG/5ML liquid Generic drug: dextromethorphan Take 60 mg by mouth 2 (two) times daily as needed for cough (and congestion).   spironolactone 25 MG tablet Commonly known as: ALDACTONE Take 1 tablet (25 mg total) by mouth daily.   Vitamin D3 25 MCG (1000 UT) Caps Take 1 capsule (1,000 Units total) by mouth daily.   Zinc Oxide 20 % Pste Apply 1 application  topically in the morning and at bedtime.        Allergies  Allergen Reactions   Hctz [Hydrochlorothiazide] Other (See Comments)    Gout flare   Metformin Other (See Comments)    Stomach upset     Consultations: None   Procedures/Studies: DG Swallowing Func-Speech Pathology Result Date: 12/15/2023 Table formatting from the original result was not included. Modified Barium Swallow Study Patient Details Name: Peter Ryan MRN: [id number] Date of Birth: May 14, 1951 Today's Date: 12/15/2023 HPI/PMH: HPI: Peter Ryan is a 73 y.o. male with medical history significant for chronic HFrEF (EF 25-30%), PAF on Eliquis, Hx of CVA with residual right hemiparesis and bedbound status, diffuse large B-cell lymphoma, T2DM, HTN, HLD, IgA MGUS, BPH who presented to the ED from [facility]  for evaluation of altered mental status and hypoxia.  Patient is known to ST service from recent admission to [facility].  He had clinical swallowing evaluation on 11/14/2023 wtih recommendation for dysphagia 3 with thin liquids taking single sips with no straw use.  Most recent chest x ray was showing low lung volumes with bibasilar opacities favored to be atelectasis but infiltrate/PNA can not be excluded. Clinical Impression: Clinical Impression: Patient presents with a mild oropharyngeal dysphagia characterized primarily by delayed swallow initiation. With thin liquids,swallow is initiated when bolus is in the vallecula however because vallecula is full, bolus then spills over during the swallow and is either silently aspirated (x1) or penetrated. Note that silent aspiration  occurred only on the initial bolus and that penetration episodes do remain above the vocal cords. Concern however for aspiration increases given silent nature. Given that patient does have what is suspected to be PNA from aspiration, would recommend initiation of more conservative diet. Nectar thick liquids consumed today with full airway protection. Clearance of bolus from pharyngeal space for all consistencies largely [facility]. Factors that may increase risk of adverse event in presence of aspiration Peter Ryan): Factors that may increase risk of adverse event in presence of aspiration Peter Ryan): Limited mobility Recommendations/Plan: Swallowing Evaluation Recommendations Swallowing Evaluation Recommendations Recommendations: PO diet PO Diet Recommendation: Regular; Mildly thick liquids (Level 2, nectar thick) Liquid Administration via: Cup; Straw Medication Administration: Whole meds with puree Supervision: Patient able to self-feed; Intermittent supervision/cueing for swallowing strategies Swallowing strategies  : Slow rate; Small bites/sips Postural changes: Position pt fully upright for meals Oral care recommendations: Oral care BID (2x/day) Caregiver Recommendations: Avoid jello, ice cream, thin soups, popsicles; Remove water pitcher; Have oral suction available Treatment Plan Treatment Plan Treatment recommendations: Therapy as outlined in treatment plan below Follow-up recommendations: -- (TBD) Functional status assessment: Patient has had a recent decline in their functional status and demonstrates the ability to make significant improvements in function in a reasonable and predictable amount of time. Treatment frequency: Min 2x/week Treatment duration: 2 weeks Interventions: Aspiration precaution training; Compensatory techniques; Patient/family education; Trials of upgraded texture/liquids; Diet toleration management by SLP Recommendations Recommendations for follow up therapy are  one component of a multi-disciplinary discharge planning process, led by the attending physician.  Recommendations may be updated based on patient status, additional functional criteria and insurance authorization. Assessment: Orofacial Exam: Orofacial Exam Oral Cavity: Oral Hygiene: WFL Oral Cavity - Dentition: Adequate natural dentition Orofacial Anatomy: WFL Anatomy: Anatomy: WFL Boluses Administered: Boluses Administered Boluses Administered: Thin liquids (Level 0); Mildly thick liquids (Level 2, nectar thick); Moderately thick liquids (Level 3, honey thick); Puree; Solid  Oral Impairment Domain: Oral Impairment Domain Lip Closure: No labial escape Tongue control during bolus hold: Posterior escape of less than half of bolus (intermittent) Bolus preparation/mastication: Timely and efficient chewing and mashing Bolus transport/lingual motion: Brisk tongue motion Oral residue: Trace residue lining oral structures Location of oral residue : Tongue; Palate Initiation of pharyngeal swallow : Valleculae  Pharyngeal Impairment Domain: Pharyngeal Impairment Domain Soft palate elevation: No bolus between soft palate (SP)/pharyngeal wall (PW) Laryngeal elevation: Complete superior movement of thyroid cartilage with complete approximation of arytenoids to epiglottic petiole Anterior hyoid excursion: Complete anterior movement Epiglottic movement: Complete inversion Laryngeal vestibule closure: Incomplete, narrow column air/contrast in laryngeal vestibule Pharyngeal stripping wave : Present - complete Pharyngeal contraction (A/P view only): N/A Pharyngoesophageal segment opening: Complete distension and complete duration, no obstruction of flow Tongue base retraction: Trace  column of contrast or air between tongue base and PPW Pharyngeal residue: Trace residue within or on pharyngeal structures Location of pharyngeal residue: Valleculae  Esophageal Impairment Domain: Esophageal Impairment Domain Esophageal clearance upright  position: Complete clearance, esophageal coating Pill: No data recorded Penetration/Aspiration Scale Score: Penetration/Aspiration Scale Score 1.  Material does not enter airway: Mildly thick liquids (Level 2, nectar thick); Moderately thick liquids (Level 3, honey thick); Puree; Solid 3.  Material enters airway, remains ABOVE vocal cords and not ejected out: Thin liquids (Level 0) 8.  Material enters airway, passes BELOW cords without attempt by patient to eject out (silent aspiration) : Thin liquids (Level 0) Compensatory Strategies: Compensatory Strategies Compensatory strategies: Yes Chin tuck: Ineffective (to prevent penetration)   General Information: Caregiver present: No  Diet Prior to this Study: Dysphagia 3 (mechanical soft); Mildly thick liquids (Level 2, nectar thick)   Temperature : Normal   Respiratory Status: WFL   Supplemental O2: None (Room air)   History of Recent Intubation: No  Behavior/Cognition: Alert; Cooperative; Pleasant mood Self-Feeding Abilities: Able to self-feed Baseline vocal quality/speech: Normal Volitional Cough: Unable to elicit (only throat clear) Volitional Swallow: Unable to elicit No data recorded Goal Planning: Prognosis for improved oropharyngeal function: Guarded Barriers to Reach Goals: Other (Comment) (unclear origin, CVA 3 years ago) No data recorded Patient/Family Stated Goal: None stated Consulted and agree with results and recommendations: Patient; Nurse Pain: Pain Assessment Pain Assessment: No/denies pain End of Session: Start Time:SLP Start Time (ACUTE ONLY): 1309 Stop Time: SLP Stop Time (ACUTE ONLY): 1330 Time Calculation:SLP Time Calculation (min) (ACUTE ONLY): 21 min Charges: SLP Evaluations $ SLP Speech Visit: 1 Visit SLP Evaluations $BSS Swallow: 1 Procedure $MBS Swallow: 1 Procedure SLP visit diagnosis: SLP Visit Diagnosis: Dysphagia, oropharyngeal phase (R13.12) Past Medical History: Past Medical History: Diagnosis Date  Arthritis   BPH (benign prostatic  hyperplasia)   Cancer (HCC)   Non-Hodgkins lymphoma  Chronic heart failure with reduced ejection fraction (HFrEF, <= 40%) (HCC)   Chronic prostatitis   Cirrhosis of liver (HCC)   Diabetes mellitus without complication (HCC)   Dyslipidemia   Elevated PSA   Gastric reflux   Gout   History of cerebrovascular accident (CVA) with residual deficit   HTN (hypertension)   Hyperlipidemia   Neuropathy   Over weight   Paroxysmal atrial fibrillation (HCC)   Psoriasis   Testicular pain, right   Urinary frequency  Past Surgical History: Past Surgical History: Procedure Laterality Date  hydrocelectomy    IR FLUORO GUIDE PORT INSERTION RIGHT  04/11/2017  IR REMOVAL TUN ACCESS W/ PORT W/O FL MOD SED  05/29/2018  PILONIDAL CYST EXCISION    TEE WITHOUT CARDIOVERSION N/A 9/10/Ryan  Procedure: TRANSESOPHAGEAL ECHOCARDIOGRAM (TEE);  Surgeon: Antonieta Iba, MD;  Location: [facility] ORS;  Service: Cardiovascular;  Laterality: N/A; Note populated for Peter Ryan, SLP Mahala Menghini., M.A. CCC-SLP Acute Rehabilitation Services Office [phone] Secure chat preferred 12/15/2023, 3:59 PM  DG Chest Portable 1 View Result Date: 12/12/2023 CLINICAL DATA:  Flu, hypoxia EXAM: PORTABLE CHEST 1 VIEW COMPARISON:  12/16/2022 FINDINGS: Cardiomegaly. Low lung volumes. Bibasilar airspace opacities could reflect atelectasis or infiltrates. No effusions. Mild vascular congestion. No acute bony abnormality. IMPRESSION: Cardiomegaly, vascular congestion. Low lung volumes with bibasilar opacities, favor atelectasis. Infiltrate/pneumonia cannot be completely excluded. Electronically Signed   By: Charlett Nose M.D.   On: 12/12/2023 22:07      Subjective: Patient seen and examined at bedside.  Feels okay to be discharged.  No fever,  vomiting, worsening shortness of breath reported  Discharge Exam: Vitals:   12/18/23 0318 12/18/23 0700  BP: 128/78 (!) 140/77  Pulse: 79 98  Resp: 16 14  Temp:    SpO2: 95% 98%    General: Pt is alert, awake, not in acute  distress.  On room air but looks chronically ill and deconditioned. Cardiovascular: rate controlled, S1/S2 + Respiratory: bilateral decreased breath sounds at bases with scattered crackles Abdominal: Soft, obese, NT, ND, bowel sounds + Extremities: Mild lower extremity edema; no cyanosis    The results of significant diagnostics from this hospitalization (including imaging, microbiology, ancillary and laboratory) are listed below for reference.     Microbiology: Recent Results (from the past 240 hours)  Resp panel by RT-PCR (RSV, Flu A&B, Covid) Anterior Nasal Swab     Status: Abnormal   Collection Time: 12/12/23 10:23 PM   Specimen: Anterior Nasal Swab  Result Value Ref Range Status   SARS Coronavirus 2 by RT PCR NEGATIVE NEGATIVE Final   Influenza A by PCR POSITIVE (A) NEGATIVE Final   Influenza B by PCR NEGATIVE NEGATIVE Final    Comment: (NOTE) The Xpert Xpress SARS-CoV-2/FLU/RSV plus assay is intended as an aid in the diagnosis of influenza from Nasopharyngeal swab specimens and should not be used as a sole basis for treatment. Nasal washings and aspirates are unacceptable for Xpert Xpress SARS-CoV-2/FLU/RSV testing.  Fact Sheet for Patients: BloggerCourse.com  Fact Sheet for Healthcare Providers: SeriousBroker.it  This test is not yet approved or cleared by the [place] FDA and has been authorized for detection and/or diagnosis of SARS-CoV-2 by FDA under an Emergency Use Authorization (EUA). This EUA will remain in effect (meaning this test can be used) for the duration of the COVID-19 declaration under Section 564(b)(1) of the Act, 21 U.S.C. section 360bbb-3(b)(1), unless the authorization is terminated or revoked.     Resp Syncytial Virus by PCR NEGATIVE NEGATIVE Final    Comment: (NOTE) Fact Sheet for Patients: BloggerCourse.com  Fact Sheet for Healthcare  Providers: SeriousBroker.it  This test is not yet approved or cleared by the [place] FDA and has been authorized for detection and/or diagnosis of SARS-CoV-2 by FDA under an Emergency Use Authorization (EUA). This EUA will remain in effect (meaning this test can be used) for the duration of the COVID-19 declaration under Section 564(b)(1) of the Act, 21 U.S.C. section 360bbb-3(b)(1), unless the authorization is terminated or revoked.  Performed at [facility], [address] [place], [place] [address]      Labs: BNP (last 3 results) Recent Labs    11/13/23 1225 12/12/23 2042  BNP 480.8* 233.2*   Basic Metabolic Panel: Recent Labs  Lab 12/14/23 0322 12/15/23 0355 12/16/23 0429 12/17/23 0314 12/18/23 0356  NA 143 139 139 139 136  K 4.2 3.9 4.1 4.4 4.0  CL 107 108 106 104 104  CO2 17* 20* 19* 22 23  GLUCOSE 239* 302* 291* 266* 272*  BUN 45* 41* 34* 32* 28*  CREATININE 1.22 1.14 0.86 1.01 0.86  CALCIUM 9.1 8.6* 8.9 9.0 8.8*  MG 1.7 1.8  --   --  1.8  PHOS 3.0  --   --   --   --    Liver Function Tests: Recent Labs  Lab 12/12/23 2028 12/14/23 0322  AST 25  --   ALT 22  --   ALKPHOS 61  --   BILITOT 0.9  --   PROT 7.6  --   ALBUMIN  3.3* 3.0*   No results for input(s): "LIPASE", "AMYLASE" in the last 168 hours. No results for input(s): "AMMONIA" in the last 168 hours. CBC: Recent Labs  Lab 12/12/23 2028 12/13/23 0337 12/14/23 0322 12/15/23 0355 12/17/23 0314 12/18/23 0356  WBC 16.5* 16.1* 16.9* 13.7* 12.9* 15.4*  NEUTROABS 13.3*  --   --   --   --  12.4*  HGB 13.8 13.6 13.4 13.0 14.3 14.9  HCT 43.0 42.3 41.2 39.7 43.8 44.2  MCV 89.8 89.8 89.0 88.6 87.1 85.3  PLT 197 190 199 198 204 245   Cardiac Enzymes: No results for input(s): "CKTOTAL", "CKMB", "CKMBINDEX", "TROPONINI" in the last 168 hours. BNP: Invalid input(s): "POCBNP" CBG: Recent Labs  Lab 12/17/23 0615 12/17/23 1211 12/17/23 1615 12/17/23 2100  12/18/23 0606  GLUCAP 290* 251* 211* 183* 278*   D-Dimer No results for input(s): "DDIMER" in the last 72 hours. Hgb A1c No results for input(s): "HGBA1C" in the last 72 hours. Lipid Profile No results for input(s): "CHOL", "HDL", "LDLCALC", "TRIG", "CHOLHDL", "LDLDIRECT" in the last 72 hours. Thyroid function studies No results for input(s): "TSH", "T4TOTAL", "T3FREE", "THYROIDAB" in the last 72 hours.  Invalid input(s): "FREET3" Anemia work up No results for input(s): "VITAMINB12", "FOLATE", "FERRITIN", "TIBC", "IRON", "RETICCTPCT" in the last 72 hours. Urinalysis    Component Value Date/Time   COLORURINE YELLOW 12/14/2023 0200   APPEARANCEUR CLEAR 12/14/2023 0200   APPEARANCEUR Clear 07/18/2016 0927   LABSPEC 1.020 12/14/2023 0200   PHURINE 5.0 12/14/2023 0200   GLUCOSEU NEGATIVE 12/14/2023 0200   HGBUR NEGATIVE 12/14/2023 0200   BILIRUBINUR NEGATIVE 12/14/2023 0200   BILIRUBINUR negative 12/09/Ryan 1505   BILIRUBINUR Negative 07/18/2016 0927   KETONESUR NEGATIVE 12/14/2023 0200   PROTEINUR NEGATIVE 12/14/2023 0200   UROBILINOGEN 0.2 12/09/Ryan 1505   NITRITE NEGATIVE 12/14/2023 0200   LEUKOCYTESUR NEGATIVE 12/14/2023 0200   Sepsis Labs Recent Labs  Lab 12/14/23 0322 12/15/23 0355 12/17/23 0314 12/18/23 0356  WBC 16.9* 13.7* 12.9* 15.4*   Microbiology Recent Results (from the past 240 hours)  Resp panel by RT-PCR (RSV, Flu A&B, Covid) Anterior Nasal Swab     Status: Abnormal   Collection Time: 12/12/23 10:23 PM   Specimen: Anterior Nasal Swab  Result Value Ref Range Status   SARS Coronavirus 2 by RT PCR NEGATIVE NEGATIVE Final   Influenza A by PCR POSITIVE (A) NEGATIVE Final   Influenza B by PCR NEGATIVE NEGATIVE Final    Comment: (NOTE) The Xpert Xpress SARS-CoV-2/FLU/RSV plus assay is intended as an aid in the diagnosis of influenza from Nasopharyngeal swab specimens and should not be used as a sole basis for treatment. Nasal washings and aspirates are  unacceptable for Xpert Xpress SARS-CoV-2/FLU/RSV testing.  Fact Sheet for Patients: BloggerCourse.com  Fact Sheet for Healthcare Providers: SeriousBroker.it  This test is not yet approved or cleared by the [place] FDA and has been authorized for detection and/or diagnosis of SARS-CoV-2 by FDA under an Emergency Use Authorization (EUA). This EUA will remain in effect (meaning this test can be used) for the duration of the COVID-19 declaration under Section 564(b)(1) of the Act, 21 U.S.C. section 360bbb-3(b)(1), unless the authorization is terminated or revoked.     Resp Syncytial Virus by PCR NEGATIVE NEGATIVE Final    Comment: (NOTE) Fact Sheet for Patients: BloggerCourse.com  Fact Sheet for Healthcare Providers: SeriousBroker.it  This test is not yet approved or cleared by the [place] FDA and has been authorized for detection and/or diagnosis of SARS-CoV-2  by FDA under an Emergency Use Authorization (EUA). This EUA will remain in effect (meaning this test can be used) for the duration of the COVID-19 declaration under Section 564(b)(1) of the Act, 21 U.S.C. section 360bbb-3(b)(1), unless the authorization is terminated or revoked.  Performed at [facility], [address], [place], [place] [address]      Time coordinating discharge: 35 minutes  SIGNED:   Glade Lloyd, MD  [facility] 12/18/2023, 8:29 AM

## 2023-12-18 NOTE — Progress Notes (Signed)
Attempted to call report to [facility]. 

## 2023-12-18 NOTE — TOC Transition Note (Signed)
 Transition of Care [facility]) - Discharge Note   Patient Details  Name: Peter Ryan MRN: [id number] Date of Birth: August 04, 1951  Transition of Care [facility]) CM/SW Contact:  Eduard Roux, LCSW Phone Number: 12/18/2023, 11:59 AM   Clinical Narrative:     Patient will Discharge to: [facility]  Discharge Date: 12/18/2023 Family Notified:spouse Transport WU:[id number]  Per MD patient is ready for discharge. RN, patient, and facility notified of discharge. Discharge Summary sent to facility. RN given number for [phone]. Ambulance transport requested for patient.   Clinical Social Worker signing off.  Antony Blackbird, MSW, LCSW Clinical Social Worker     Final next level of care: Skilled Nursing Facility Barriers to Discharge: Barriers Resolved   Patient Goals and CMS Choice            Discharge Placement              Patient chooses bed at: [name] Patient to be transferred to facility by: [facility] Name of family member notified: spouse Patient and family notified of of transfer: 12/18/23  Discharge Plan and Services Additional resources added to the After Visit Summary for                                       Social Drivers of Health (SDOH) Interventions SDOH Screenings   Food Insecurity: No Food Insecurity (12/13/2023)  Housing: Low Risk  (12/13/2023)  Transportation Needs: No Transportation Needs (12/13/2023)  Utilities: Not At Risk (12/13/2023)  Alcohol Screen: Low Risk  (10/17/2021)  Depression (PHQ2-9): Low Risk  (10/17/2021)  Financial Resource Strain: Medium Risk (11/17/2023)  Physical Activity: Unknown (11/17/2023)  Social Connections: Socially Isolated (12/13/2023)  Stress: No Stress Concern Present (10/17/2021)  Tobacco Use: Medium Risk (12/12/2023)     Readmission Risk Interventions     No data to display

## 2023-12-25 ENCOUNTER — Emergency Department (HOSPITAL_COMMUNITY)
Admission: EM | Admit: 2023-12-25 | Discharge: 2023-12-25 | Disposition: A | Discharge status: Skilled Nursing Facility | Attending: Emergency Medicine | Admitting: Emergency Medicine

## 2023-12-25 ENCOUNTER — Other Ambulatory Visit: Payer: Self-pay

## 2023-12-25 ENCOUNTER — Encounter (HOSPITAL_COMMUNITY): Payer: Self-pay | Admitting: Emergency Medicine

## 2023-12-25 ENCOUNTER — Emergency Department (HOSPITAL_COMMUNITY)

## 2023-12-25 DIAGNOSIS — I509 Heart failure, unspecified: Secondary | ICD-10-CM | POA: Insufficient documentation

## 2023-12-25 DIAGNOSIS — S0990XA Unspecified injury of head, initial encounter: Secondary | ICD-10-CM | POA: Insufficient documentation

## 2023-12-25 DIAGNOSIS — Z8572 Personal history of non-Hodgkin lymphomas: Secondary | ICD-10-CM | POA: Insufficient documentation

## 2023-12-25 DIAGNOSIS — Y92129 Unspecified place in nursing home as the place of occurrence of the external cause: Secondary | ICD-10-CM | POA: Insufficient documentation

## 2023-12-25 DIAGNOSIS — E1140 Type 2 diabetes mellitus with diabetic neuropathy, unspecified: Secondary | ICD-10-CM | POA: Diagnosis not present

## 2023-12-25 DIAGNOSIS — I110 Hypertensive heart disease with heart failure: Secondary | ICD-10-CM | POA: Diagnosis not present

## 2023-12-25 DIAGNOSIS — W19XXXA Unspecified fall, initial encounter: Secondary | ICD-10-CM

## 2023-12-25 DIAGNOSIS — W01198A Fall on same level from slipping, tripping and stumbling with subsequent striking against other object, initial encounter: Secondary | ICD-10-CM | POA: Insufficient documentation

## 2023-12-25 LAB — COMPREHENSIVE METABOLIC PANEL WITH GFR
ALT: 24 U/L (ref 0–44)
AST: 17 U/L (ref 15–41)
Albumin: 3.1 g/dL — ABNORMAL LOW (ref 3.5–5.0)
Alkaline Phosphatase: 51 U/L (ref 38–126)
Anion gap: 12 (ref 5–15)
BUN: 33 mg/dL — ABNORMAL HIGH (ref 8–23)
CO2: 24 mmol/L (ref 22–32)
Calcium: 8.5 mg/dL — ABNORMAL LOW (ref 8.9–10.3)
Chloride: 100 mmol/L (ref 98–111)
Creatinine, Ser: 1.19 mg/dL (ref 0.61–1.24)
GFR, Estimated: >60 mL/min (ref >60)
Glucose, Bld: 154 mg/dL — ABNORMAL HIGH (ref 70–99)
Potassium: 3.8 mmol/L (ref 3.5–5.1)
Sodium: 136 mmol/L (ref 135–145)
Total Bilirubin: 0.8 mg/dL (ref 0.0–1.2)
Total Protein: 6.2 g/dL — ABNORMAL LOW (ref 6.5–8.1)

## 2023-12-25 LAB — CBC WITH DIFFERENTIAL/PLATELET
Abs Immature Granulocytes: 0.11 10*3/uL — ABNORMAL HIGH (ref 0.00–0.07)
Basophils Absolute: 0 10*3/uL (ref 0.0–0.1)
Basophils Relative: 0 %
Eosinophils Absolute: 0.1 10*3/uL (ref 0.0–0.5)
Eosinophils Relative: 1 %
HCT: 46 % (ref 39.0–52.0)
Hemoglobin: 15 g/dL (ref 13.0–17.0)
Immature Granulocytes: 1 %
Lymphocytes Relative: 15 %
Lymphs Abs: 2 10*3/uL (ref 0.7–4.0)
MCH: 28.7 pg (ref 26.0–34.0)
MCHC: 32.6 g/dL (ref 30.0–36.0)
MCV: 88.1 fL (ref 80.0–100.0)
Monocytes Absolute: 1.2 10*3/uL — ABNORMAL HIGH (ref 0.1–1.0)
Monocytes Relative: 9 %
Neutro Abs: 10.5 10*3/uL — ABNORMAL HIGH (ref 1.7–7.7)
Neutrophils Relative %: 74 %
Platelets: 245 10*3/uL (ref 150–400)
RBC: 5.22 MIL/uL (ref 4.22–5.81)
RDW: 15.5 % (ref 11.5–15.5)
WBC: 14 10*3/uL — ABNORMAL HIGH (ref 4.0–10.5)
nRBC: 0 % (ref 0.0–0.2)

## 2023-12-25 NOTE — ED Notes (Signed)
 Report given to [facility].

## 2023-12-25 NOTE — ED Triage Notes (Signed)
 [facility] 148 76 76 Cbg 154  97% room air

## 2023-12-25 NOTE — Discharge Instructions (Signed)
 Your CT scans did not show internal injury or bleeding. You may continue your home medications. Return with any new or worsening symptoms.

## 2023-12-25 NOTE — ED Triage Notes (Addendum)
 Unwitnessed fall on thinners at nursing home, tile floor. He is at facility for right side paralysis Staff found pt face down covered in urine Eliquis BID No LOC Denies pain  C collar in place by EMS

## 2023-12-25 NOTE — ED Provider Notes (Signed)
 Emergency Department Provider Note   I have reviewed the triage vital signs and the nursing notes.   HISTORY  Chief Complaint Fall   HPI Peter Ryan is a 73 y.o. male with past history reviewed below presents to the emergency department after fall at his nursing facility.  He fell approximately 2 feet landing on the floor and striking his head.  According to staff he rolled striking his head on the ground without loss of consciousness.  He has baseline right side paralysis.  Apparently was covered in urine as well but patient denies any pain or symptoms.  He does take Eliquis and was activated as a level 2 trauma from the field per protocol.   Past Medical History:  Diagnosis Date   Arthritis    BPH (benign prostatic hyperplasia)    Cancer (HCC)    Non-Hodgkins lymphoma   Chronic heart failure with reduced ejection fraction (HFrEF, <= 40%) (HCC)    Chronic prostatitis    Cirrhosis of liver (HCC)    Diabetes mellitus without complication (HCC)    Dyslipidemia    Elevated PSA    Gastric reflux    Gout    History of cerebrovascular accident (CVA) with residual deficit    HTN (hypertension)    Hyperlipidemia    Neuropathy    Over weight    Paroxysmal atrial fibrillation (HCC)    Psoriasis    Testicular pain, right    Urinary frequency     Review of Systems  Constitutional: No fever/chills Cardiovascular: Denies chest pain. Respiratory: Denies shortness of breath. Gastrointestinal: No abdominal pain. Skin: Negative for rash. Neurological: Mild HA.  ____________________________________________   PHYSICAL EXAM:  VITAL SIGNS: ED Triage Vitals  Encounter Vitals Group     BP 12/25/23 1934 (!) 180/100     Pulse Rate 12/25/23 1940 73     Resp 12/25/23 1940 18     Temp 12/25/23 1940 (!) 97.5 F (36.4 C)     Temp Source 12/25/23 1940 Oral     SpO2 12/25/23 1940 99 %     Weight 12/25/23 1938 240 lb 4.8 oz (109 kg)     Height 12/25/23 1938 5\' 10"  (1.778 m)     Constitutional: Alert and oriented. Well appearing and in no acute distress. Eyes: Conjunctivae are normal. PERRL.  Head: Atraumatic. Nose: No congestion/rhinnorhea. Mouth/Throat: Mucous membranes are moist. Neck: No stridor. C collar in place.  Cardiovascular: Normal rate, regular rhythm. Good peripheral circulation. Grossly normal heart sounds.   Respiratory: Normal respiratory effort.  No retractions. Lungs CTAB. Gastrointestinal: Soft and nontender. No distention.  Musculoskeletal: No gross deformities of extremities. Patient with normal ROM of the bilateral upper and lower extremities.  Neurologic: Baseline right sided hemiparesis.  Skin:  Skin is warm, dry and intact. No rash noted.   ____________________________________________   LABS (all labs ordered are listed, but only abnormal results are displayed)  Labs Reviewed  COMPREHENSIVE METABOLIC PANEL WITH GFR - Abnormal; Notable for the following components:      Result Value   Glucose, Bld 154 (*)    BUN 33 (*)    Calcium 8.5 (*)    Total Protein 6.2 (*)    Albumin 3.1 (*)    All other components within normal limits  CBC WITH DIFFERENTIAL/PLATELET - Abnormal; Notable for the following components:   WBC 14.0 (*)    Neutro Abs 10.5 (*)    Monocytes Absolute 1.2 (*)    Abs Immature Granulocytes 0.11 (*)  All other components within normal limits   ____________________________________________   PROCEDURES  Procedure(s) performed:   Procedures  None  ____________________________________________   INITIAL IMPRESSION / ASSESSMENT AND PLAN / ED COURSE  Pertinent labs & imaging results that were available during my care of the patient were reviewed by me and considered in my medical decision making (see chart for details).   This patient is Presenting for Evaluation of head injury, which does require a range of treatment options, and is a complaint that involves a high risk of morbidity and mortality.  The  Differential Diagnoses includes subdural hematoma, epidural hematoma, acute concussion, traumatic subarachnoid hemorrhage, cerebral contusions, etc.  I did obtain Additional Historical Information from EMS.  Clinical Laboratory Tests Ordered, included CBC with leukocytosis. No anemia. No AKI.   Radiologic Tests Ordered, included CT head/c spine. I independently interpreted the images and agree with radiology interpretation.   Cardiac Monitor Tracing which shows NSR.    Social Determinants of Health Risk patient lives in a SNF.   Medical Decision Making: Summary:  Patient presents to the emergency department as a level 2 trauma per protocol due to fall on anticoagulant.  Minimal fall height of around 2 feet estimated by EMS.  Patient awake/alert.  No outward signs of trauma. C collar in place upon arrival. Plan for CT imaging and reassess.   Reevaluation with update and discussion with patient.  CT head and C-spine unremarkable.  Plan for return to facility.  Mild leukocytosis but no objective or subjective findings to strongly suspect developing infection at this time.   Patient's presentation is most consistent with acute presentation with potential threat to life or bodily function.   Disposition: discharge  ____________________________________________  FINAL CLINICAL IMPRESSION(S) / ED DIAGNOSES  Final diagnoses:  Fall, initial encounter  Injury of head, initial encounter    Note:  This document was prepared using Dragon voice recognition software and may include unintentional dictation errors.  Alona Bene, MD, [facility] Emergency Medicine    Edge Mauger, Arlyss Repress, MD 12/29/23 [phone]

## 2023-12-30 ENCOUNTER — Telehealth: Payer: Self-pay | Admitting: Family

## 2023-12-30 NOTE — Telephone Encounter (Incomplete)
 Called to confirm/remind patient of [facility] on 12/[facility]***.   Appointment:   [] Confirmed  [x] Left mess   [] No answer/No voice mail  [] Phone not in service  Patient reminded to bring all medications and/or complete list.  Confirmed patient has transportation. Gave directions, instructed to utilize valet parking.

## 2023-12-31 ENCOUNTER — Encounter: Admitting: Family

## 2024-02-03 ENCOUNTER — Telehealth: Payer: Self-pay | Admitting: Family

## 2024-02-03 NOTE — Telephone Encounter (Signed)
 Called sched canceled appt on 12/31/23

## 2024-04-13 ENCOUNTER — Emergency Department

## 2024-04-13 ENCOUNTER — Other Ambulatory Visit: Payer: Self-pay

## 2024-04-13 ENCOUNTER — Inpatient Hospital Stay
Admission: EM | Admit: 2024-04-13 | Discharge: 2024-04-14 | DRG: 951 | Disposition: A | Discharge status: Hospice | Attending: Hospitalist | Admitting: Hospitalist

## 2024-04-13 DIAGNOSIS — Z794 Long term (current) use of insulin: Secondary | ICD-10-CM

## 2024-04-13 DIAGNOSIS — J9601 Acute respiratory failure with hypoxia: Secondary | ICD-10-CM | POA: Diagnosis present

## 2024-04-13 DIAGNOSIS — C8330 Diffuse large B-cell lymphoma, unspecified site: Secondary | ICD-10-CM | POA: Diagnosis present

## 2024-04-13 DIAGNOSIS — I5023 Acute on chronic systolic (congestive) heart failure: Secondary | ICD-10-CM | POA: Diagnosis not present

## 2024-04-13 DIAGNOSIS — Z7901 Long term (current) use of anticoagulants: Secondary | ICD-10-CM | POA: Diagnosis not present

## 2024-04-13 DIAGNOSIS — I5042 Chronic combined systolic (congestive) and diastolic (congestive) heart failure: Secondary | ICD-10-CM | POA: Diagnosis present

## 2024-04-13 DIAGNOSIS — N400 Enlarged prostate without lower urinary tract symptoms: Secondary | ICD-10-CM | POA: Diagnosis present

## 2024-04-13 DIAGNOSIS — N411 Chronic prostatitis: Secondary | ICD-10-CM | POA: Diagnosis present

## 2024-04-13 DIAGNOSIS — Z888 Allergy status to other drugs, medicaments and biological substances status: Secondary | ICD-10-CM

## 2024-04-13 DIAGNOSIS — I480 Paroxysmal atrial fibrillation: Secondary | ICD-10-CM | POA: Diagnosis present

## 2024-04-13 DIAGNOSIS — I5043 Acute on chronic combined systolic (congestive) and diastolic (congestive) heart failure: Secondary | ICD-10-CM | POA: Diagnosis not present

## 2024-04-13 DIAGNOSIS — I110 Hypertensive heart disease with heart failure: Secondary | ICD-10-CM | POA: Diagnosis present

## 2024-04-13 DIAGNOSIS — Z833 Family history of diabetes mellitus: Secondary | ICD-10-CM | POA: Diagnosis not present

## 2024-04-13 DIAGNOSIS — Z515 Encounter for palliative care: Principal | ICD-10-CM

## 2024-04-13 DIAGNOSIS — Z66 Do not resuscitate: Secondary | ICD-10-CM | POA: Diagnosis present

## 2024-04-13 DIAGNOSIS — J189 Pneumonia, unspecified organism: Secondary | ICD-10-CM | POA: Diagnosis present

## 2024-04-13 DIAGNOSIS — K7460 Unspecified cirrhosis of liver: Secondary | ICD-10-CM | POA: Diagnosis present

## 2024-04-13 DIAGNOSIS — E876 Hypokalemia: Secondary | ICD-10-CM | POA: Diagnosis present

## 2024-04-13 DIAGNOSIS — R9431 Abnormal electrocardiogram [ECG] [EKG]: Secondary | ICD-10-CM

## 2024-04-13 DIAGNOSIS — Z79899 Other long term (current) drug therapy: Secondary | ICD-10-CM | POA: Diagnosis not present

## 2024-04-13 DIAGNOSIS — I69351 Hemiplegia and hemiparesis following cerebral infarction affecting right dominant side: Secondary | ICD-10-CM | POA: Diagnosis not present

## 2024-04-13 DIAGNOSIS — Z87891 Personal history of nicotine dependence: Secondary | ICD-10-CM

## 2024-04-13 DIAGNOSIS — I509 Heart failure, unspecified: Secondary | ICD-10-CM

## 2024-04-13 DIAGNOSIS — E119 Type 2 diabetes mellitus without complications: Secondary | ICD-10-CM | POA: Diagnosis present

## 2024-04-13 DIAGNOSIS — Z7401 Bed confinement status: Secondary | ICD-10-CM | POA: Diagnosis not present

## 2024-04-13 DIAGNOSIS — E785 Hyperlipidemia, unspecified: Secondary | ICD-10-CM | POA: Diagnosis present

## 2024-04-13 DIAGNOSIS — L89151 Pressure ulcer of sacral region, stage 1: Secondary | ICD-10-CM | POA: Diagnosis present

## 2024-04-13 DIAGNOSIS — R0902 Hypoxemia: Principal | ICD-10-CM

## 2024-04-13 LAB — URINALYSIS, W/ REFLEX TO CULTURE (INFECTION SUSPECTED)
Bacteria, UA: NONE SEEN
Bilirubin Urine: NEGATIVE
Glucose, UA: NEGATIVE mg/dL
Hgb urine dipstick: NEGATIVE
Ketones, ur: NEGATIVE mg/dL
Leukocytes,Ua: NEGATIVE
Nitrite: NEGATIVE
Protein, ur: 100 mg/dL — AB
Specific Gravity, Urine: 1.018 (ref 1.005–1.030)
pH: 6 (ref 5.0–8.0)

## 2024-04-13 LAB — BLOOD GAS, VENOUS
Acid-Base Excess: 8.1 mmol/L — ABNORMAL HIGH (ref 0.0–2.0)
Bicarbonate: 33.4 mmol/L — ABNORMAL HIGH (ref 20.0–28.0)
O2 Saturation: 79.5 %
Patient temperature: 37
pCO2, Ven: 48 mmHg (ref 44–60)
pH, Ven: 7.45 — ABNORMAL HIGH (ref 7.25–7.43)
pO2, Ven: 48 mmHg — ABNORMAL HIGH (ref 32–45)

## 2024-04-13 LAB — CBC WITH DIFFERENTIAL/PLATELET
Abs Immature Granulocytes: 0.05 K/uL (ref 0.00–0.07)
Basophils Absolute: 0.1 K/uL (ref 0.0–0.1)
Basophils Relative: 1 %
Eosinophils Absolute: 0.2 K/uL (ref 0.0–0.5)
Eosinophils Relative: 2 %
HCT: 46.1 % (ref 39.0–52.0)
Hemoglobin: 15.2 g/dL (ref 13.0–17.0)
Immature Granulocytes: 1 %
Lymphocytes Relative: 14 %
Lymphs Abs: 1.3 K/uL (ref 0.7–4.0)
MCH: 28.6 pg (ref 26.0–34.0)
MCHC: 33 g/dL (ref 30.0–36.0)
MCV: 86.7 fL (ref 80.0–100.0)
Monocytes Absolute: 0.6 K/uL (ref 0.1–1.0)
Monocytes Relative: 6 %
Neutro Abs: 7.3 K/uL (ref 1.7–7.7)
Neutrophils Relative %: 76 %
Platelets: 279 K/uL (ref 150–400)
RBC: 5.32 MIL/uL (ref 4.22–5.81)
RDW: 14.8 % (ref 11.5–15.5)
WBC: 9.5 K/uL (ref 4.0–10.5)
nRBC: 0 % (ref 0.0–0.2)

## 2024-04-13 LAB — RESP PANEL BY RT-PCR (RSV, FLU A&B, COVID)  RVPGX2
Influenza A by PCR: NEGATIVE
Influenza B by PCR: NEGATIVE
Resp Syncytial Virus by PCR: NEGATIVE
SARS Coronavirus 2 by RT PCR: NEGATIVE

## 2024-04-13 LAB — PROCALCITONIN: Procalcitonin: <0.1 ng/mL

## 2024-04-13 LAB — COMPREHENSIVE METABOLIC PANEL WITH GFR
ALT: 9 U/L (ref 0–44)
AST: 11 U/L — ABNORMAL LOW (ref 15–41)
Albumin: 3.2 g/dL — ABNORMAL LOW (ref 3.5–5.0)
Alkaline Phosphatase: 64 U/L (ref 38–126)
Anion gap: 10 (ref 5–15)
BUN: 11 mg/dL (ref 8–23)
CO2: 30 mmol/L (ref 22–32)
Calcium: 8.3 mg/dL — ABNORMAL LOW (ref 8.9–10.3)
Chloride: 102 mmol/L (ref 98–111)
Creatinine, Ser: 0.72 mg/dL (ref 0.61–1.24)
GFR, Estimated: >60 mL/min (ref >60)
Glucose, Bld: 180 mg/dL — ABNORMAL HIGH (ref 70–99)
Potassium: 2.5 mmol/L — CL (ref 3.5–5.1)
Sodium: 142 mmol/L (ref 135–145)
Total Bilirubin: 0.9 mg/dL (ref 0.0–1.2)
Total Protein: 6.9 g/dL (ref 6.5–8.1)

## 2024-04-13 LAB — PROTIME-INR
INR: 1 (ref 0.8–1.2)
Prothrombin Time: 14.3 s (ref 11.4–15.2)

## 2024-04-13 LAB — LACTIC ACID, PLASMA: Lactic Acid, Venous: 1.4 mmol/L (ref 0.5–1.9)

## 2024-04-13 LAB — BRAIN NATRIURETIC PEPTIDE: B Natriuretic Peptide: 1054.4 pg/mL — ABNORMAL HIGH (ref 0.0–100.0)

## 2024-04-13 LAB — TROPONIN I (HIGH SENSITIVITY): Troponin I (High Sensitivity): 27 ng/L — ABNORMAL HIGH (ref <18)

## 2024-04-13 LAB — MAGNESIUM: Magnesium: 1.7 mg/dL (ref 1.7–2.4)

## 2024-04-13 LAB — D-DIMER, QUANTITATIVE: D-Dimer, Quant: <0.27 ug{FEU}/mL (ref 0.00–0.50)

## 2024-04-13 MED ORDER — LORAZEPAM 2 MG/ML IJ SOLN: 1.0000 mg | INTRAMUSCULAR | Status: DC | PRN

## 2024-04-13 MED ORDER — POTASSIUM CHLORIDE 10 MEQ/100ML IV SOLN
10.0000 meq | Freq: Once | INTRAVENOUS | Status: AC
  Administered 2024-04-13: 10 meq via INTRAVENOUS

## 2024-04-13 MED ORDER — OXYCODONE HCL 5 MG PO TABS: 5.0000 mg | ORAL_TABLET | ORAL | Status: DC | PRN

## 2024-04-13 MED ORDER — POTASSIUM CHLORIDE 10 MEQ/100ML IV SOLN
10.0000 meq | Freq: Once | INTRAVENOUS | Status: AC
  Administered 2024-04-13: 10 meq via INTRAVENOUS
  Filled 2024-04-13: qty 100

## 2024-04-13 MED ORDER — LORAZEPAM 1 MG PO TABS: 1.0000 mg | ORAL_TABLET | ORAL | Status: DC | PRN

## 2024-04-13 MED ORDER — POTASSIUM CHLORIDE CRYS ER 20 MEQ PO TBCR: 40.0000 meq | EXTENDED_RELEASE_TABLET | Freq: Once | ORAL | Status: DC

## 2024-04-13 MED ORDER — FUROSEMIDE 10 MG/ML IJ SOLN
40.0000 mg | Freq: Two times a day (BID) | INTRAMUSCULAR | Status: DC
  Administered 2024-04-14: 40 mg via INTRAVENOUS
  Filled 2024-04-13 (×2): qty 4

## 2024-04-13 MED ORDER — SODIUM CHLORIDE 0.9 % IV SOLN
2.0000 g | Freq: Once | INTRAVENOUS | Status: AC
  Administered 2024-04-13: 2 g via INTRAVENOUS
  Filled 2024-04-13: qty 20

## 2024-04-13 MED ORDER — POTASSIUM CITRATE-CITRIC ACID 1100-334 MG/5ML PO SOLN
40.0000 meq | Freq: Once | ORAL | Status: AC
  Administered 2024-04-13: 40 meq via ORAL
  Filled 2024-04-13 (×2): qty 20

## 2024-04-13 MED ORDER — SODIUM CHLORIDE 0.9 % IV SOLN
100.0000 mg | Freq: Once | INTRAVENOUS | Status: AC
  Administered 2024-04-13: 100 mg via INTRAVENOUS
  Filled 2024-04-13: qty 100

## 2024-04-13 MED ORDER — PANTOPRAZOLE SODIUM 40 MG IV SOLR
20.0000 mg | Freq: Once | INTRAVENOUS | Status: AC
  Administered 2024-04-13: 20 mg via INTRAVENOUS
  Filled 2024-04-13: qty 10

## 2024-04-13 MED ORDER — LACTATED RINGERS IV BOLUS
250.0000 mL | Freq: Once | INTRAVENOUS | Status: AC
  Administered 2024-04-13: 250 mL via INTRAVENOUS

## 2024-04-13 MED ORDER — POTASSIUM CHLORIDE 10 MEQ/100ML IV SOLN
10.0000 meq | INTRAVENOUS | Status: DC
  Administered 2024-04-13: 10 meq via INTRAVENOUS
  Filled 2024-04-13: qty 100

## 2024-04-13 MED ORDER — HEPARIN SODIUM (PORCINE) 5000 UNIT/ML IJ SOLN: 5000.0000 [IU] | Freq: Three times a day (TID) | INTRAMUSCULAR | Status: DC

## 2024-04-13 MED ORDER — LORAZEPAM 2 MG/ML PO CONC
1.0000 mg | ORAL | Status: DC | PRN
  Filled 2024-04-13: qty 0.5

## 2024-04-13 NOTE — ED Triage Notes (Addendum)
 Pt to ED via [facility] from home. C/C SOB x1 week. Denies cough and fever. Pt has hx of CVA, HTN, and CHF. Pt has R sided deficits from prior CVA. Pt oriented x4. Denies blood thinners. EMS reports pt's SPO2 was 88% on arrival on RA. Pt does not wear chronic O2.   EMS vitals  CBG 186 BP 184/87 HR 40 in ventricular bigeminy  95% on 6L

## 2024-04-13 NOTE — TOC Initial Note (Signed)
 Transition of Care [facility]) - Initial/Assessment Note    Patient Details  Name: Peter Ryan MRN: [id number] Date of Birth: 1950/10/11  [facility]) CM/SW Contact:    Edsel DELENA Fischer, LCSW Phone Number: 04/13/2024, 4:53 PM  Clinical Narrative:                  SW met with pt at bedside in ED.  Pt wife-Sherry was present as well.  Pt was able to give first and last name.  SW asked if pt gives permission for sw to speak with wife- Pt nodded yes.  Per wife report: Pt and wife live together and been married 26 years.  Safety/ DV concerns: No.  PCP: Dr. Ileen and Pharmacy: [facility].  Wife stated that pt was housed at [facility] for a month and order was for PT but pt is home now.  DME: wheelchair, hospital bed.  Wife stated that she will need assistance with transportation to get pt home.  Seeking hospice support.  Wife stated that has church support and pays for caregivers to come and help with pt.  Wife expressed that cost of prescriptions, aide and transportation are financial concerns.  SW expressed to wife that in reference to transportation via ambulance usually if its not a medial reason for transport, it will be an out of pocket expense.  Wife stated that pt prescription drug plan ran out and wife was unaware and she has to wait until next year to renew.  SW expressed that wife can speak with doctor about seeing if their is a generic that can be substituted if appropriate or applying  for prescription assistance plan with drug company.  SW expressed that sw can assist with that if needed. SW expressed that if pt receives hospice that aide services can be provided.  Pt requires assistance with ADLs expect feeding and IADLs.  Pt wears briefs.  Medication Adherence: yes.  Pt was not on oxygen at home but was admitted for trouble breathing and on oxygen in ED now and possible will need oxygen at home. Pt is not a Cytogeneticist.  SW provided wife with list of hospice agencies in the  area. Wife decided on [facility] for hospice services.  SW educated wife on hospice services as well.  SW called [facility] at [phone] and submitted referral and also [employer].   Barriers to Discharge: Continued Medical Work up   Patient Goals and CMS Choice Patient states their goals for this hospitalization and ongoing recovery are:: hospice services CMS Medicare.gov Compare Post Acute Care list provided to:: Other (Comment Required) (pt wife. pt gave verbal permission to speak with his wife-Sherry Belger) Choice offered to / list presented to : Patient, Spouse      Expected Discharge Plan and Services In-house Referral: Clinical Social Work     Living arrangements for the past 2 months: Single Family Home                                      Prior Living Arrangements/Services Living arrangements for the past 2 months: Single Family Home Lives with:: Spouse Patient language and need for interpreter reviewed:: Yes (Pt was able to tell sw first and last name and when asked if it okay to speak with his wife. pt nodded yes) Do you feel safe going back to the place where you live?: Yes  Need for Family Participation in Patient Care: Yes (Comment) Care giver support system in place?: Yes (comment)      Activities of Daily Living      Permission Sought/Granted                  Emotional Assessment Appearance:: Appears stated age Attitude/Demeanor/Rapport: Lethargic Affect (typically observed): Unable to Assess Orientation: : Oriented to Self   Psych Involvement: No (comment)  Admission diagnosis:  Acute exacerbation of CHF (congestive heart failure) (HCC) [I50.9] Patient Active Problem List   Diagnosis Date Noted   Acute respiratory failure with hypoxia (HCC) 12/12/2023   AKI (acute kidney injury) (HCC) 12/12/2023   Acute exacerbation of CHF (congestive heart failure) (HCC) 11/16/2023   Slurred speech 11/15/2023    Lower extremity edema 11/15/2023   Pressure injury of skin 11/15/2023   Chronic heart failure with reduced ejection fraction (HFrEF, <= 40%) (HCC) 11/14/2023   Dilated cardiomyopathy (HCC) 12/22/2022   Elevated brain natriuretic peptide (BNP) level 12/21/2022   Hypervolemia 12/21/2022   Paroxysmal atrial fibrillation (HCC) 12/20/2022   Pleural effusion, bilateral 12/18/2022   Acute on chronic diastolic CHF (congestive heart failure) (HCC) 12/16/2022   Prolonged QT interval 12/16/2022   Symptomatic anemia 02/07/2022   Hypokalemia 02/07/2022   TIA (transient ischemic attack) 12/01/2020   Right sided weakness 11/30/2020   Hypertension associated with diabetes (HCC) 11/30/2020   History of cerebrovascular accident (CVA) with residual deficit 11/30/2020   Left pontine cerebrovascular accident (HCC) 06/14/2020   Facial droop    Rectal bleeding    Morbid obesity (HCC)    Right hemiparesis (HCC)    Uncontrolled type 2 diabetes mellitus with hyperglycemia, without long-term current use of insulin  (HCC)    Acute CVA (cerebrovascular accident) (HCC) 06/05/2020   Obesity, Class III, BMI 40-49.9 (morbid obesity) 06/05/2020   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) 05/02/2020   Atherosclerosis of aorta (HCC) 05/02/2020   Goals of care, counseling/discussion 04/08/2017   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 (HCC) 04/08/2017   Liver cirrhosis (HCC) 02/08/2017   SBP (spontaneous bacterial peritonitis) (HCC) 02/07/2017   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   DOE (dyspnea on exertion) 11/15/2016   Obesity, morbid (more than 100 lbs over ideal weight or BMI > 40) (HCC) 11/15/2016   Elevated PSA 05/30/2015   BPH with obstruction/lower urinary tract symptoms 05/30/2015   Type 2 diabetes mellitus (HCC) 05/18/2014   Arthritis 01/03/2014   Hyperlipidemia associated with type 2 diabetes mellitus (HCC) 01/03/2014   Benign  essential hypertension 01/03/2014   Gout 01/03/2014   Psoriasis 01/03/2014   Reflux 01/03/2014   PCP:  Ileen Rosaline NOVAK, NP Pharmacy:   [facility] [place] [place] - [address] [place] [address] Phone: [phone] Fax: [phone]     Social Drivers of Health (SDOH) Social History: SDOH Screenings   Food Insecurity: No Food Insecurity (12/13/2023)  Housing: Low Risk  (12/13/2023)  Transportation Needs: No Transportation Needs (12/13/2023)  Utilities: Not At Risk (12/13/2023)  Alcohol Screen: Low Risk  (10/17/2021)  Depression (PHQ2-9): Low Risk  (10/17/2021)  Financial Resource Strain: Medium Risk (11/17/2023)  Physical Activity: Unknown (11/17/2023)  Social Connections: Socially Isolated (12/13/2023)  Stress: No Stress Concern Present (10/17/2021)  Tobacco Use: Medium Risk (04/13/2024)   SDOH Interventions:     Readmission Risk Interventions     No data to display

## 2024-04-13 NOTE — ED Notes (Signed)
 Pt provided peri care at this time. New brief in place.

## 2024-04-13 NOTE — Sepsis Progress Note (Signed)
 Secure chat to bedside RN for clarification. 2 sets of blood cx were sent prior to antibiotic administration; not yet populating in EPIC chart.

## 2024-04-13 NOTE — ED Notes (Signed)
 MD made aware critical potassium 2.5.

## 2024-04-13 NOTE — Progress Notes (Signed)
 [facility] - [facility] Liaison Note  Received request from [place], Transitions of Care Manager, for hospice services at home after discharge. Spoke with Joen, spouse to initiate education related to hospice philosophy, services, and team approach to care. Patient/family verbalized understanding of information given. Per discussion, the plan is for discharge home by [facility]/EMS or private vehicle possibly as soon as tomorrow.    DME needs discussed. Patient has a hospital bed, hoyer lift, bedside commode, wheelchair in the home. Patient/family requests the following equipment for delivery: new gel topper mattress and oxygen. The address has been verified and is correct in the chart. Joen is the family contact to arrange time of equipment delivery.  Please send signed and completed DNR home with patient/family. Please provide prescriptions at discharge as needed to ensure ongoing symptom management.   [facility] information and contact numbers given to [place]. Above information shared with Edsel, Transitions of Care Manager. Please call with any questions or concerns.   Thank you for the opportunity to participate in this patient's care.   Elouise Husband BSN, Charity fundraiser, OCN [employer] [phone]

## 2024-04-13 NOTE — ED Provider Notes (Signed)
 [facility] Provider Note    Event Date/Time   First MD Initiated Contact with Patient 04/13/24 [phone]     (approximate)   History   Shortness of Breath   HPI  Peter Ryan is a 73 y.o. male  male with PMH of CVA with right hemiparesis and bedbound, HFrEF, PAF on Eliquis , DM-2, diffuse large B-cell lymphoma, IgA MGUS, BPH, HTN, HLD and obesity brought to ED from home for one week of worsening shortness of breath.  There was an associated cough.  He denies any fevers chest pain abdominal pain or any new neurological symptoms.  He reports compliance with all of his medications.  His wife presents at bedside after EMS arrival and reports that he is at his baseline mental status.  Per EMS, patient was hypoxic to 78 on their arrival and he does not use oxygen at baseline.      Physical Exam   Triage Vital Signs: ED Triage Vitals  Encounter Vitals Group     BP 04/13/24 0914 (!) 158/90     Girls Systolic BP Percentile --      Girls Diastolic BP Percentile --      Boys Systolic BP Percentile --      Boys Diastolic BP Percentile --      Pulse Rate 04/13/24 0914 93     Resp 04/13/24 0914 (!) 29     Temp 04/13/24 0914 98.8 F (37.1 C)     Temp Source 04/13/24 0914 Oral     SpO2 04/13/24 0914 97 %     Weight 04/13/24 0915 254 lb (115.2 kg)     Height 04/13/24 0915 5' 10 (1.778 m)     Head Circumference --      Peak Flow --      Pain Score 04/13/24 0915 0     Pain Loc --      Pain Education --      Exclude from Growth Chart --     Most recent vital signs: Vitals:   04/13/24 1200 04/13/24 1230  BP: 136/78 118/69  Pulse: 75 69  Resp: (!) 21 20  Temp:    SpO2: 99% 99%    Nursing Triage Note reviewed. Vital signs reviewed and patients oxygen saturation is hypoxic  General: Patient is well nourished, well developed, awake and alert, in distress Head: Normocephalic and atraumatic Eyes: Normal inspection, extraocular muscles intact, no conjunctival  pallor Ear, nose, throat: Normal external exam Neck: Normal range of motion Respiratory: Patient is in moderate respiratory distress, lungs with rhonchi and rales Cardiovascular: Patient is not tachycardic, RRR  GI: Abd SNT with no guarding or rebound  Extremities: pulses intact with good cap refills, no LE pitting edema or calf tenderness Neuro: The patient is alert and oriented to person, place, and time, appropriately conversive, able to move LUE, not legs or right side--per patient and wife, these are chronic findings and nothing new.  Skin: Warm, dry, There is a stage 1 sacral ulcer Psych: normal mood and affect, no SI or HI  ED Results / Procedures / Treatments   Labs (all labs ordered are listed, but only abnormal results are displayed) Labs Reviewed  COMPREHENSIVE METABOLIC PANEL WITH GFR - Abnormal; Notable for the following components:      Result Value   Potassium 2.5 (*)    Glucose, Bld 180 (*)    Calcium  8.3 (*)    Albumin  3.2 (*)    AST 11 (*)  All other components within normal limits  BRAIN NATRIURETIC PEPTIDE - Abnormal; Notable for the following components:   B Natriuretic Peptide 1,054.4 (*)    All other components within normal limits  BLOOD GAS, VENOUS - Abnormal; Notable for the following components:   pH, Ven 7.45 (*)    pO2, Ven 48 (*)    Bicarbonate 33.4 (*)    Acid-Base Excess 8.1 (*)    All other components within normal limits  TROPONIN I (HIGH SENSITIVITY) - Abnormal; Notable for the following components:   Troponin I (High Sensitivity) 27 (*)    All other components within normal limits  RESP PANEL BY RT-PCR (RSV, FLU A&B, COVID)  RVPGX2  CULTURE, BLOOD (ROUTINE X 2)  CULTURE, BLOOD (ROUTINE X 2)  LACTIC ACID, PLASMA  CBC WITH DIFFERENTIAL/PLATELET  PROTIME-INR  MAGNESIUM   D-DIMER, QUANTITATIVE  LACTIC ACID, PLASMA  URINALYSIS, W/ REFLEX TO CULTURE (INFECTION SUSPECTED)     EKG  EKG and rhythm strip are interpreted by myself at:9:12  AM 04/13/2024  EKG: [Normal sinus rhythm] at heart rate of 92, normal QRS duration, QTc 557, nonspecific ST segments and T waves? no ectopy? EKG not consistent with Acute STEMI Rhythm Strip: Non specific EKG changes    RADIOLOGY CXR: Demonstrates opacities versus mild pleural edema on my independent review interpretation and read    PROCEDURES:  Critical Care performed: Yes, see critical care procedure note(s)  .Critical Care  Performed by: Nicholaus Rolland BRAVO, MD Authorized by: Nicholaus Rolland BRAVO, MD   Critical care provider statement:    Critical care time (minutes):  35   Critical care time was exclusive of:  Separately billable procedures and treating other patients   Critical care was necessary to treat or prevent imminent or life-threatening deterioration of the following conditions:  Respiratory failure   Critical care was time spent personally by me on the following activities:  Development of treatment plan with patient or surrogate, discussions with consultants, evaluation of patient's response to treatment, examination of patient, ordering and review of laboratory studies, ordering and review of radiographic studies, ordering and performing treatments and interventions, pulse oximetry, re-evaluation of patient's condition and review of old charts   Care discussed with: admitting provider      MEDICATIONS ORDERED IN ED: Medications  doxycycline  (VIBRAMYCIN ) 100 mg in sodium chloride  0.9 % 250 mL IVPB (100 mg Intravenous New Bag/Given 04/13/24 1106)  potassium chloride  10 mEq in 100 mL IVPB (has no administration in time range)  cefTRIAXone  (ROCEPHIN ) 2 g in sodium chloride  0.9 % 100 mL IVPB (0 g Intravenous Stopped 04/13/24 1000)  lactated ringers  bolus 250 mL (0 mLs Intravenous Stopped 04/13/24 1103)  potassium chloride  10 mEq in 100 mL IVPB (0 mEq Intravenous Stopped 04/13/24 1226)  pantoprazole  (PROTONIX ) injection 20 mg (20 mg Intravenous Given 04/13/24 1231)     IMPRESSION  / MDM / ASSESSMENT AND PLAN / ED COURSE  I reviewed the triage vital signs and the nursing notes.                              Summary: Very nice 73 year old male who presents to the emergency department with 1 week of progressively worsening shortness of breath found to be profoundly hypoxic by EMS when he does not use oxygen at baseline.  He has multiple comorbidities including prior CVA and CHF  Differential Diagnoses including by not limited to: Congestive heart failure exacerbation, pneumonia, sepsis, anemia,  PE, URI  ED course: Patient appears acutely and appears unwell.  He is stable on 6 L of oxygen.  Blood gas demonstrates no hypercarbia.  Chest x-ray consistent with pneumonia and he did receive ceftriaxone  and doxycycline .  Given concern for fluid overload I have held off on administering 30 cc per cake fluids and have only given him a small bolus of 500.  His potassium is low and this was repleted.  Given the low potassium I would not pursue diuretics at this time.  I do think patient would be more comfortable with BiPAP for 1 to 2 hours and this was discussed with respiratory therapist.  Patient called into hospitalist for admission   Data(2/3 categories following were performed): I reviewed or ordered at least three unique tests, external notes, and/or the history required an independent historian as one of the three requirements as following: At least 3 labs/imaging studies were obtained and/or reviewed. AND  I discussed the management of the patient with the following external physician or qualified healthcare provider: Admitting physician  Risk: This patient has a high risk of morbidity due to further diagnostic testing or treatment. Rationale: Decision made regarding admission  Admit Level 5 - Labs/Rads, Admit, Consult:  Suggested E/M Coding Level: 5, 99285  This level has been selected based on the May 10, 2022 CPT guidelines for E/M codes in the Emergency Department based on 2/3  of the CoPA, Data, and Risk.    Clinical Course as of 04/13/24 1249  Tue Apr 13, 2024  1047 B Natriuretic Peptide(!): 1,054.4 [HD]    Clinical Course User Index [HD] Nicholaus Rolland BRAVO, MD     FINAL CLINICAL IMPRESSION(S) / ED DIAGNOSES   Final diagnoses:  Hypoxia  Pneumonia due to infectious organism, unspecified laterality, unspecified part of lung  Congestive heart failure, unspecified HF chronicity, unspecified heart failure type (HCC)  Prolonged Q-T interval on ECG     Rx / DC Orders   ED Discharge Orders     None        Note:  This document was prepared using Dragon voice recognition software and may include unintentional dictation errors.   Nicholaus Rolland BRAVO, MD 04/13/24 1249

## 2024-04-13 NOTE — Sepsis Progress Note (Signed)
 Code Sepsis protocol being monitored by eLink.

## 2024-04-13 NOTE — H&P (Signed)
 History and Physical    Patient: Peter Ryan FMW:[id number] DOB: 1951/05/22 DOA: 04/13/2024 DOS: the patient was seen and examined on 04/13/2024 PCP: Ileen Rosaline NOVAK, NP  Patient coming from: Home   Chief Complaint:  Chief Complaint  Patient presents with   Shortness of Breath   HPI: Peter Ryan is a 73 y.o. male with medical history significant of type 2 diabetes, prior CVA with residual hemiplegia and hemiparesis on the right side, hypertensive heart disease with heart failure, hyperlipidemia, paroxysmal A-fib, chronic combined systolic and diastolic congestive heart failure, diffuse large B-cell lymphoma, BPH, obesity, bedbound at baseline.    Patient presents from his home.  He has had 1 week of worsening dyspnea with associated cough.  On EMS arrival patient was found to hypoxic to 78%.  He does not use oxygen at baseline.  In ED CXR consistent left-sided airspace disease and left pleural effusion.  He was started on ceftriaxone  doxycycline .  Labs reveal a potassium of 2.5 which was repleted. He has had no fever, no sick contacts.  Upon further discussion with his wife, who acts as his primary caregiver, she is requesting transition to hospice.  She reports that they have started this process outpatient and were working on getting set up at the time of this admission.  She reports that the patient's quality of life is poor, he is bedbound, and he has given up on getting better. We discussed what hospice care entails and the patient's wife, Joen, endorses understanding.  She would like for him to transition to comfort measures only while in the hospital.  She is okay with continued diuresis but has requested to discontinue all lab draws and fingersticks.  The patient defers all decision-making to his wife.  Review of Systems: As mentioned in the history of present illness. All other systems reviewed and are negative. Past Medical History:  Diagnosis Date   Arthritis     BPH (benign prostatic hyperplasia)    Cancer (HCC)    Non-Hodgkins lymphoma   Chronic heart failure with reduced ejection fraction (HFrEF, <= 40%) (HCC)    Chronic prostatitis    Cirrhosis of liver (HCC)    Diabetes mellitus without complication (HCC)    Dyslipidemia    Elevated PSA    Gastric reflux    Gout    History of cerebrovascular accident (CVA) with residual deficit    HTN (hypertension)    Hyperlipidemia    Neuropathy    Over weight    Paroxysmal atrial fibrillation (HCC)    Psoriasis    Testicular pain, right    Urinary frequency    Past Surgical History:  Procedure Laterality Date   hydrocelectomy     IR FLUORO GUIDE PORT INSERTION RIGHT  04/11/2017   IR REMOVAL TUN ACCESS W/ PORT W/O FL MOD SED  05/29/2018   PILONIDAL CYST EXCISION     TEE WITHOUT CARDIOVERSION N/A 06/09/2020   Procedure: TRANSESOPHAGEAL ECHOCARDIOGRAM (TEE);  Surgeon: Gollan, Timothy J, MD;  Location: [facility] ORS;  Service: Cardiovascular;  Laterality: N/A;   Social History:  reports that he quit smoking about 49 years ago. His smoking use included cigarettes. He started smoking about 57 years ago. He has a 12 pack-year smoking history. He has never used smokeless tobacco. He reports that he does not drink alcohol and does not use drugs.  Allergies  Allergen Reactions   Hctz [Hydrochlorothiazide ] Other (See Comments)    Gout flare   Metformin Other (See Comments)  Stomach upset     Family History  Problem Relation Age of Onset   Uterine cancer Mother    Diabetes Mother    Cancer Mother    Cancer Maternal Uncle    Bladder Cancer Neg Hx    Kidney disease Neg Hx    Prostate cancer Neg Hx     Prior to Admission medications   Medication Sig Start Date End Date Taking? Authorizing Provider  acetaminophen  (TYLENOL ) 325 MG tablet Take 2 tablets (650 mg total) by mouth every 4 (four) hours as needed for mild pain (or temp > 37.5 C (99.5 F)). 07/10/20  Yes Angiulli, Toribio PARAS, PA-C  albuterol   (VENTOLIN  HFA) 108 (90 Base) MCG/ACT inhaler Inhale 2 puffs into the lungs every 4 (four) hours as needed for wheezing or shortness of breath. 12/18/23  Yes Cheryle Page, MD  allopurinol  (ZYLOPRIM ) 300 MG tablet TAKE ONE TABLET EVERY DAY 06/11/21  Yes Bertrum Charlie CROME, MD  atorvastatin  (LIPITOR ) 80 MG tablet TAKE ONE TABLET (80 MG) BY MOUTH EVERY DAY 07/09/21  Yes Bertrum Charlie CROME, MD  carvedilol  (COREG ) 12.5 MG tablet 1 tablet with food Orally Twice a day for 90 days HHRN reports pt taking 12.5mg    Yes [provider]  clotrimazole-betamethasone (LOTRISONE) cream 1 Application.  1 application Externally Twice a day for 30 days 03/24/24 07/22/24 Yes [provider]  finasteride  (PROSCAR ) 5 MG tablet Take 1 tablet (5 mg total) by mouth daily. 03/01/21  Yes McGowan, Clotilda A, PA-C  furosemide  (LASIX ) 40 MG tablet Take 1 tablet (40 mg total) by mouth daily. 12/23/22 12/12/24 Yes Sreenath, Sudheer B, MD  insulin  aspart (NOVOLOG ) 100 UNIT/ML FlexPen Inject 5 Units into the skin 3 (three) times daily with meals. 11/20/23  Yes Caleen Qualia, MD  insulin  glargine-yfgn (SEMGLEE ) 100 UNIT/ML injection Inject 0.15 mLs (15 Units total) into the skin daily. 11/21/23  Yes Amin, Sumayya, MD  meclizine (ANTIVERT) 25 MG tablet 1 tablet as needed Orally every 8 hours PRN   Yes [provider]  polyethylene glycol (MIRALAX  / GLYCOLAX ) 17 g packet Take 17 g by mouth daily as needed. 02/09/22  Yes Jens Durand, MD  Amino Acids-Protein Hydrolys (PRO-STAT AWC) LIQD Take 30 mLs by mouth 2 (two) times daily with a meal. Patient not taking: Reported on 04/13/2024    [provider]  apixaban  (ELIQUIS ) 5 MG TABS tablet Take 1 tablet (5 mg total) by mouth 2 (two) times daily. Patient not taking: Reported on 04/13/2024 12/23/22 12/12/24  Jhonny Calvin NOVAK, MD  Cholecalciferol  (VITAMIN D3) 25 MCG (1000 UT) CAPS Take 1 capsule (1,000 Units total) by mouth daily. Patient not taking: Reported on  04/13/2024 07/10/20   Angiulli, Toribio PARAS, PA-C  dextromethorphan (ROBITUSSIN 12 HOUR COUGH) 30 MG/5ML liquid Take 60 mg by mouth 2 (two) times daily as needed for cough (and congestion). Patient not taking: Reported on 12/13/2023    [provider]  ferrous sulfate  325 (65 FE) MG tablet Take 1 tablet (325 mg total) by mouth every other day. Patient not taking: Reported on 04/13/2024 02/09/22 12/12/24  Jens Durand, MD  spironolactone  (ALDACTONE ) 25 MG tablet Take 1 tablet (25 mg total) by mouth daily. Patient not taking: Reported on 04/13/2024 12/23/22 12/12/24  Jhonny Calvin NOVAK, MD  vitamin B-12 (CYANOCOBALAMIN ) 1000 MCG tablet Take 1 tablet (1,000 mcg total) by mouth daily. Patient not taking: Reported on 04/13/2024 09/04/20   Bertrum Charlie CROME, MD  Zinc  Oxide 20 % PSTE Apply 1 application  topically in the morning and at bedtime.    [provider]    Physical Exam: Vitals:   04/13/24 1100 04/13/24 1130 04/13/24 1200 04/13/24 1230  BP: (!) 141/77 128/72 136/78 118/69  Pulse: 85 80 75 69  Resp: 16 20 (!) 21 20  Temp:      TempSrc:      SpO2: 94% 97% 99% 99%  Weight:      Height:       Constitutional:  Normal appearance. Non toxic-appearing.  Obese HENT: Head Normocephalic and atraumatic.  Mucous membranes are moist.  Eyes:  Extraocular intact. Conjunctivae normal. Pupils are equal, round, and reactive to light.  Cardiovascular: Rate and Rhythm: Normal rate and regular rhythm.  Pulmonary: Non labored, symmetric rise of chest wall.  5 L nasal cannula in place Musculoskeletal:  Normal range of motion.  Skin: warm and dry. not jaundiced.  Neurological: Alert, requires some prompting.  Drowsy.  Data Reviewed:  Lab Results  Component Value Date   WBC 9.5 04/13/2024   HGB 15.2 04/13/2024   HCT 46.1 04/13/2024   MCV 86.7 04/13/2024   PLT 279 04/13/2024      Latest Ref Rng & Units 04/13/2024    9:18 AM 12/25/2023    7:45 PM 12/18/2023    3:56 AM  BMP  Glucose 70 -  99 mg/dL 819  845  727   BUN 8 - 23 mg/dL 11  33  28   Creatinine 0.61 - 1.24 mg/dL 9.27  8.80  9.13   Sodium 135 - 145 mmol/L 142  136  136   Potassium 3.5 - 5.1 mmol/L 2.5  3.8  4.0   Chloride 98 - 111 mmol/L 102  100  104   CO2 22 - 32 mmol/L 30  24  23    Calcium  8.9 - 10.3 mg/dL 8.3  8.5  8.8     Assessment and Plan: Comfort measures only - After extensive discussion with the patient's wife Joen, she has elected for comfort measures only.  She would like to pursue home hospice.  I have consulted TOC to assist in these arrangements. - For now we will continue with comfort medications only.  This will include Lasix  for the time being as patient is acutely volume overloaded - Discontinue all other lab draws, discontinue continuous monitoring. qshift only vitals  Acute hypoxic respiratory failure - Briefly necessitated BiPAP in the ED, now stable on 6 L - Respiratory failure appears to be multifactorial.  Concern of pneumonia, status post ceftriaxone  and doxycycline .  Patient has had progressive dyspnea with cough outpatient. - Procalcitonin ordered on admission, still pending.  Doubt pneumonia.  No WBC.  No fever. - CXR consistent with volume overload and BNP is elevated. - Diuresis as low  Acute on Chronic CHF Hypokalemia -- Most recent echocardiogram February 2025: LVEF 25 to 30%, global hypokinesis, mild left ventricular hypertrophy, grade 1 diastolic dysfunction. - In light of comfort measures only we will discontinue all GDMT except for Lasix  which is providing patient immediate comfort.  We will also replete potassium today to allow for diuresis  CVA with right hemiparesis, bedbound Paroxysmal atrial fibrillation Type 2 diabetes Diffuse large B-cell lymphoma Hypertension Hyperlipidemia Bcell Lymphoma -- Have discontinued all meds at wife's request.   Advance Care Planning: DNR, Comfort measures only.   Consults: Palliative care  Family Communication: Discussion with  wife, Joen, as above.   Severity of Illness: The appropriate patient status for this patient is INPATIENT. Inpatient  status is judged to be reasonable and necessary in order to provide the required intensity of service to ensure the patient's safety. The patient's presenting symptoms, physical exam findings, and initial radiographic and laboratory data in the context of their chronic comorbidities is felt to place them at high risk for further clinical deterioration. Furthermore, it is not anticipated that the patient will be medically stable for discharge from the hospital within 2 midnights of admission.   * I certify that at the point of admission it is my clinical judgment that the patient will require inpatient hospital care spanning beyond 2 midnights from the point of admission due to high intensity of service, high risk for further deterioration and high frequency of surveillance required.*  Author: Fumi Guadron, DO 04/13/2024 12:52 PM  For on call review [email or web address].

## 2024-04-14 DIAGNOSIS — I5043 Acute on chronic combined systolic (congestive) and diastolic (congestive) heart failure: Secondary | ICD-10-CM | POA: Diagnosis not present

## 2024-04-14 MED ORDER — ONDANSETRON HCL 4 MG/2ML IJ SOLN
4.0000 mg | Freq: Four times a day (QID) | INTRAMUSCULAR | Status: DC | PRN
  Administered 2024-04-14: 4 mg via INTRAVENOUS
  Filled 2024-04-14: qty 2

## 2024-04-14 NOTE — Plan of Care (Signed)
 Consult to PMT noted as wife desires hospice. Notes reviewed. Patient is currently full comfort care. TOC has spoken with patient/family. Hospice liaison nurse Shawn met with patient/family yesterday, and plans are in place for patient to discharge home with hospice.     As plans are in place, PMT will cancel consult. Please reconsult if further needs arise.

## 2024-04-14 NOTE — ED Notes (Addendum)
 Pt wife requesting nausea medication before he gets on the ambulance

## 2024-04-14 NOTE — Discharge Summary (Signed)
 Physician Discharge Summary   Peter Ryan  male DOB: 12-15-1950  FMW:[id number]  PCP: Ileen Rosaline NOVAK, NP  Admit date: 04/13/2024 Discharge date: 04/14/2024  Admitted From: home Disposition:  home with hospice CODE STATUS: DNR   Hospital Course:  For full details, please see H&P, progress notes, consult notes and ancillary notes.  Briefly,  Peter Ryan is a 73 y.o. male with medical history significant of type 2 diabetes, prior CVA with residual hemiplegia and hemiparesis on the right side, hypertensive heart disease with heart failure, paroxysmal A-fib, chronic combined systolic and diastolic congestive heart failure, diffuse large B-cell lymphoma, BPH, obesity, bedbound at baseline.     Patient presented from his home.  He has had 1 week of worsening dyspnea with associated cough.  On EMS arrival patient was found to hypoxic to 78%.  He does not use oxygen at baseline.  In ED CXR consistent left-sided airspace disease and left pleural effusion.  He was started on ceftriaxone  doxycycline .  Labs reveal a potassium of 2.5 which was repleted.  Upon further discussion between admitting physician and his wife, who acts as his primary caregiver, wife requested transition to hospice.  She reported that they have started this process outpatient and were working on getting set up at the time of this admission.  She reported that the patient's quality of life is poor, he is bedbound, and he has given up on getting better.  She would like for him to transition to comfort measures only while in the hospital.  Comfort measures only - After extensive discussion with the patient's wife Joen, she has elected for comfort measures only.  Hospice liaison set up home DME, and pt was discharged home with hospice.   Acute hypoxic respiratory failure - Briefly necessitated BiPAP in the ED, then weaned down to 6 L, then 2L.   - CXR consistent with volume overload and BNP is elevated.  Less  likely PNA since procal is <0.1, no fever, no leukocytosis.   --abx not continued.   --Diuretic not continued since pt had severe hypokalemia and no more labs are to be done to monitor the level.   Acute on Chronic combined CHF Hypokalemia --Most recent echocardiogram February 2025: LVEF 25 to 30%, global hypokinesis, mild left ventricular hypertrophy, grade 1 diastolic dysfunction. --d/c'ed home coreg  and lasix .  Diuretic not continued since pt had severe hypokalemia and no more labs are to be done to monitor the level. --pt not taking aldactone  PTA   CVA with right hemiparesis, bedbound Paroxysmal atrial fibrillation Type 2 diabetes Diffuse large B-cell lymphoma Hypertension Hyperlipidemia Bcell Lymphoma -- Have discontinued all meds at wife's request.   Discharge Diagnoses:  Principal Problem:   Acute exacerbation of CHF (congestive heart failure) (HCC)   30 Day Unplanned Readmission Risk Score    Flowsheet Row ED to Hosp-Admission (Current) from 04/13/2024 in [facility] Emergency Department at [facility]  30 Day Unplanned Readmission Risk Score (%) 27.57 Filed at 04/14/2024 1200    This score is the patient's risk of an unplanned readmission within 30 days of being discharged (0 -100%). The score is based on dignosis, age, lab data, medications, orders, and past utilization.   Low:  0-14.9   Medium: 15-21.9   High: 22-29.9   Extreme: 30 and above         Discharge Instructions:  Allergies as of 04/14/2024       Reactions   Hctz [hydrochlorothiazide ] Other (See Comments)   Gout flare  Metformin Other (See Comments)   Stomach upset        Medication List     STOP taking these medications    apixaban  5 MG Tabs tablet Commonly known as: ELIQUIS    atorvastatin  80 MG tablet Commonly known as: LIPITOR    carvedilol  12.5 MG tablet Commonly known as: COREG    cyanocobalamin  1000 MCG tablet Commonly known as: VITAMIN B12   ferrous sulfate  325 (65 FE)  MG tablet   furosemide  40 MG tablet Commonly known as: LASIX    insulin  aspart 100 UNIT/ML FlexPen Commonly known as: NOVOLOG    insulin  glargine-yfgn 100 UNIT/ML injection Commonly known as: SEMGLEE    Pro-Stat AWC Liqd   Robitussin 12 Hour Cough 30 MG/5ML liquid Generic drug: dextromethorphan   spironolactone  25 MG tablet Commonly known as: ALDACTONE    Vitamin D3 25 MCG (1000 UT) Caps       TAKE these medications    acetaminophen  325 MG tablet Commonly known as: TYLENOL  Take 2 tablets (650 mg total) by mouth every 4 (four) hours as needed for mild pain (or temp > 37.5 C (99.5 F)).   albuterol  108 (90 Base) MCG/ACT inhaler Commonly known as: VENTOLIN  HFA Inhale 2 puffs into the lungs every 4 (four) hours as needed for wheezing or shortness of breath.   allopurinol  300 MG tablet Commonly known as: ZYLOPRIM  TAKE ONE TABLET EVERY DAY   clotrimazole-betamethasone cream Commonly known as: LOTRISONE 1 Application.  1 application Externally Twice a day for 30 days   finasteride  5 MG tablet Commonly known as: PROSCAR  Take 1 tablet (5 mg total) by mouth daily.   meclizine 25 MG tablet Commonly known as: ANTIVERT 1 tablet as needed Orally every 8 hours PRN   polyethylene glycol 17 g packet Commonly known as: MIRALAX  / GLYCOLAX  Take 17 g by mouth daily as needed.   Zinc  Oxide 20 % Pste Apply 1 application  topically in the morning and at bedtime.          Allergies  Allergen Reactions   Hctz [Hydrochlorothiazide ] Other (See Comments)    Gout flare   Metformin Other (See Comments)    Stomach upset      The results of significant diagnostics from this hospitalization (including imaging, microbiology, ancillary and laboratory) are listed below for reference.   Consultations:   Procedures/Studies: DG Chest Port 1 View Result Date: 04/13/2024 CLINICAL DATA:  Possible sepsis. EXAM: PORTABLE CHEST 1 VIEW COMPARISON:  12/12/2023 FINDINGS: Numerous leads and  wires project over the chest. Patient rotated right. Moderate cardiomegaly. Probable small left pleural effusion. No pneumothorax. Increased left base airspace disease. IMPRESSION: Left base airspace disease is suspicious for infection or aspiration. Probable small left pleural effusion. Limitations secondary to AP portable technique and patient size. Electronically Signed   By: Rockey Kilts M.D.   On: 04/13/2024 09:57      Labs: BNP (last 3 results) Recent Labs    11/13/23 1225 12/12/23 2042 04/13/24 0918  BNP 480.8* 233.2* 1,054.4*   Basic Metabolic Panel: Recent Labs  Lab 04/13/24 0918  NA 142  K 2.5*  CL 102  CO2 30  GLUCOSE 180*  BUN 11  CREATININE 0.72  CALCIUM  8.3*  MG 1.7   Liver Function Tests: Recent Labs  Lab 04/13/24 0918  AST 11*  ALT 9  ALKPHOS 64  BILITOT 0.9  PROT 6.9  ALBUMIN  3.2*   No results for input(s): LIPASE, AMYLASE in the last 168 hours. No results for input(s): AMMONIA in the last  168 hours. CBC: Recent Labs  Lab 04/13/24 0918  WBC 9.5  NEUTROABS 7.3  HGB 15.2  HCT 46.1  MCV 86.7  PLT 279   Cardiac Enzymes: No results for input(s): CKTOTAL, CKMB, CKMBINDEX, TROPONINI in the last 168 hours. BNP: Invalid input(s): POCBNP CBG: No results for input(s): GLUCAP in the last 168 hours. D-Dimer Recent Labs    04/13/24 0918  DDIMER <0.27   Hgb A1c No results for input(s): HGBA1C in the last 72 hours. Lipid Profile No results for input(s): CHOL, HDL, LDLCALC, TRIG, CHOLHDL, LDLDIRECT in the last 72 hours. Thyroid  function studies No results for input(s): TSH, T4TOTAL, T3FREE, THYROIDAB in the last 72 hours.  Invalid input(s): FREET3 Anemia work up No results for input(s): VITAMINB12, FOLATE, FERRITIN, TIBC, IRON , RETICCTPCT in the last 72 hours. Urinalysis    Component Value Date/Time   COLORURINE YELLOW (A) 04/13/2024 0918   APPEARANCEUR CLEAR (A) 04/13/2024 0918    APPEARANCEUR Clear 07/18/2016 0927   LABSPEC 1.018 04/13/2024 0918   PHURINE 6.0 04/13/2024 0918   GLUCOSEU NEGATIVE 04/13/2024 0918   HGBUR NEGATIVE 04/13/2024 0918   BILIRUBINUR NEGATIVE 04/13/2024 0918   BILIRUBINUR negative 09/07/2020 1505   BILIRUBINUR Negative 07/18/2016 0927   KETONESUR NEGATIVE 04/13/2024 0918   PROTEINUR 100 (A) 04/13/2024 0918   UROBILINOGEN 0.2 09/07/2020 1505   NITRITE NEGATIVE 04/13/2024 0918   LEUKOCYTESUR NEGATIVE 04/13/2024 0918   Sepsis Labs Recent Labs  Lab 04/13/24 0918  WBC 9.5   Microbiology Recent Results (from the past 240 hours)  Resp panel by RT-PCR (RSV, Flu A&B, Covid) Anterior Nasal Swab     Status: None   Collection Time: 04/13/24  9:18 AM   Specimen: Anterior Nasal Swab  Result Value Ref Range Status   SARS Coronavirus 2 by RT PCR NEGATIVE NEGATIVE Final    Comment: (NOTE) SARS-CoV-2 target nucleic acids are NOT DETECTED.  The SARS-CoV-2 RNA is generally detectable in upper respiratory specimens during the acute phase of infection. The lowest concentration of SARS-CoV-2 viral copies this assay can detect is 138 copies/mL. A negative result does not preclude SARS-Cov-2 infection and should not be used as the sole basis for treatment or other patient management decisions. A negative result may occur with  improper specimen collection/handling, submission of specimen other than nasopharyngeal swab, presence of viral mutation(s) within the areas targeted by this assay, and inadequate number of viral copies(<138 copies/mL). A negative result must be combined with clinical observations, patient history, and epidemiological information. The expected result is Negative.  Fact Sheet for Patients:  BloggerCourse.com  Fact Sheet for Healthcare Providers:  SeriousBroker.it  This test is no t yet approved or cleared by the [place]  FDA and  has been authorized for detection and/or  diagnosis of SARS-CoV-2 by FDA under an Emergency Use Authorization (EUA). This EUA will remain  in effect (meaning this test can be used) for the duration of the COVID-19 declaration under Section 564(b)(1) of the Act, 21 U.S.C.section 360bbb-3(b)(1), unless the authorization is terminated  or revoked sooner.       Influenza A by PCR NEGATIVE NEGATIVE Final   Influenza B by PCR NEGATIVE NEGATIVE Final    Comment: (NOTE) The Xpert Xpress SARS-CoV-2/FLU/RSV plus assay is intended as an aid in the diagnosis of influenza from Nasopharyngeal swab specimens and should not be used as a sole basis for treatment. Nasal washings and aspirates are unacceptable for Xpert Xpress SARS-CoV-2/FLU/RSV testing.  Fact Sheet for Patients: BloggerCourse.com  Fact Sheet for  Healthcare Providers: SeriousBroker.it  This test is not yet approved or cleared by the [place]  FDA and has been authorized for detection and/or diagnosis of SARS-CoV-2 by FDA under an Emergency Use Authorization (EUA). This EUA will remain in effect (meaning this test can be used) for the duration of the COVID-19 declaration under Section 564(b)(1) of the Act, 21 U.S.C. section 360bbb-3(b)(1), unless the authorization is terminated or revoked.     Resp Syncytial Virus by PCR NEGATIVE NEGATIVE Final    Comment: (NOTE) Fact Sheet for Patients: BloggerCourse.com  Fact Sheet for Healthcare Providers: SeriousBroker.it  This test is not yet approved or cleared by the [place]  FDA and has been authorized for detection and/or diagnosis of SARS-CoV-2 by FDA under an Emergency Use Authorization (EUA). This EUA will remain in effect (meaning this test can be used) for the duration of the COVID-19 declaration under Section 564(b)(1) of the Act, 21 U.S.C. section 360bbb-3(b)(1), unless the authorization is terminated  or revoked.  Performed at [facility], [address] [place], [place] [address]   Blood Culture (routine x 2)     Status: None (Preliminary result)   Collection Time: 04/13/24  9:18 AM   Specimen: BLOOD  Result Value Ref Range Status   Specimen Description BLOOD BLOOD LEFT FOREARM  Final   Special Requests   Final    BOTTLES DRAWN AEROBIC AND ANAEROBIC Blood Culture adequate volume   Culture   Final    NO GROWTH < 24 HOURS Performed at [facility], [address], [place], [place] [address]    Report Status PENDING  Incomplete  Blood Culture (routine x 2)     Status: None (Preliminary result)   Collection Time: 04/13/24  9:18 AM   Specimen: BLOOD  Result Value Ref Range Status   Specimen Description BLOOD LEFT ANTECUBITAL  Final   Special Requests   Final    BOTTLES DRAWN AEROBIC AND ANAEROBIC Blood Culture adequate volume   Culture   Final    NO GROWTH < 24 HOURS Performed at [facility], [address], [place], [place] [address]    Report Status PENDING  Incomplete     Total time spend on discharging this patient, including the last patient exam, discussing the hospital stay, instructions for ongoing care as it relates to all pertinent caregivers, as well as preparing the medical discharge records, prescriptions, and/or referrals as applicable, is 40 minutes.    Ellouise Haber, MD  [facility] 04/14/2024, 3:26 PM

## 2024-04-14 NOTE — ED Notes (Signed)
 [facility] called for transport to residence , spoke with Darina

## 2024-04-18 LAB — CULTURE, BLOOD (ROUTINE X 2)
Culture: NO GROWTH
Culture: NO GROWTH
Special Requests: ADEQUATE
Special Requests: ADEQUATE

## 2024-07-31 DEATH — deceased

## 2024-08-06 ENCOUNTER — Other Ambulatory Visit (HOSPITAL_COMMUNITY): Payer: Self-pay
